# Patient Record
Sex: Female | Born: 1950
Health system: Southern US, Community
[De-identification: ages and names within clinical notes are randomized; demographics above are authoritative.]

## PROBLEM LIST (undated history)

## (undated) DIAGNOSIS — R253 Fasciculation: Secondary | ICD-10-CM

## (undated) DIAGNOSIS — H3581 Retinal edema: Secondary | ICD-10-CM

## (undated) DIAGNOSIS — R3129 Other microscopic hematuria: Secondary | ICD-10-CM

## (undated) DIAGNOSIS — K648 Other hemorrhoids: Secondary | ICD-10-CM

## (undated) DIAGNOSIS — K219 Gastro-esophageal reflux disease without esophagitis: Secondary | ICD-10-CM

## (undated) DIAGNOSIS — Z411 Encounter for cosmetic surgery: Secondary | ICD-10-CM

## (undated) DIAGNOSIS — I491 Atrial premature depolarization: Secondary | ICD-10-CM

## (undated) DIAGNOSIS — Z87442 Personal history of urinary calculi: Secondary | ICD-10-CM

## (undated) DIAGNOSIS — N952 Postmenopausal atrophic vaginitis: Secondary | ICD-10-CM

## (undated) DIAGNOSIS — I071 Rheumatic tricuspid insufficiency: Secondary | ICD-10-CM

## (undated) DIAGNOSIS — M549 Dorsalgia, unspecified: Secondary | ICD-10-CM

## (undated) DIAGNOSIS — K589 Irritable bowel syndrome without diarrhea: Secondary | ICD-10-CM

## (undated) DIAGNOSIS — K625 Hemorrhage of anus and rectum: Secondary | ICD-10-CM

## (undated) DIAGNOSIS — K5900 Constipation, unspecified: Secondary | ICD-10-CM

## (undated) DIAGNOSIS — M8580 Other specified disorders of bone density and structure, unspecified site: Secondary | ICD-10-CM

## (undated) DIAGNOSIS — R55 Syncope and collapse: Secondary | ICD-10-CM

## (undated) DIAGNOSIS — G43109 Migraine with aura, not intractable, without status migrainosus: Secondary | ICD-10-CM

## (undated) DIAGNOSIS — H43392 Other vitreous opacities, left eye: Secondary | ICD-10-CM

## (undated) DIAGNOSIS — I351 Nonrheumatic aortic (valve) insufficiency: Secondary | ICD-10-CM

## (undated) DIAGNOSIS — I493 Ventricular premature depolarization: Secondary | ICD-10-CM

## (undated) DIAGNOSIS — F449 Dissociative and conversion disorder, unspecified: Secondary | ICD-10-CM

## (undated) DIAGNOSIS — J309 Allergic rhinitis, unspecified: Secondary | ICD-10-CM

## (undated) DIAGNOSIS — N200 Calculus of kidney: Secondary | ICD-10-CM

## (undated) HISTORY — DX: Dissociative and conversion disorder, unspecified: F44.9

## (undated) HISTORY — DX: Gastro-esophageal reflux disease without esophagitis: K21.9

## (undated) HISTORY — DX: Postmenopausal atrophic vaginitis: N95.2

## (undated) HISTORY — DX: Constipation, unspecified: K59.00

## (undated) HISTORY — DX: Other microscopic hematuria: R31.29

## (undated) HISTORY — DX: Atrial premature depolarization: I49.1

## (undated) HISTORY — DX: Syncope and collapse: R55

## (undated) HISTORY — DX: Allergic rhinitis, unspecified: J30.9

## (undated) HISTORY — DX: Retinal edema: H35.81

## (undated) HISTORY — DX: Other specified disorders of bone density and structure, unspecified site: M85.80

## (undated) HISTORY — DX: Irritable bowel syndrome, unspecified: K58.9

## (undated) HISTORY — DX: Nonrheumatic aortic (valve) insufficiency: I35.1

## (undated) HISTORY — PX: SKIN CANCER EXCISION: SHX779

## (undated) HISTORY — DX: Dorsalgia, unspecified: M54.9

## (undated) HISTORY — DX: Fasciculation: R25.3

## (undated) HISTORY — DX: Rheumatic tricuspid insufficiency: I07.1

## (undated) HISTORY — DX: Other vitreous opacities, left eye: H43.392

## (undated) HISTORY — DX: Migraine with aura, not intractable, without status migrainosus: G43.109

## (undated) HISTORY — DX: Encounter for cosmetic surgery: Z41.1

## (undated) HISTORY — DX: Hemorrhage of anus and rectum: K62.5

## (undated) HISTORY — DX: Other hemorrhoids: K64.8

## (undated) HISTORY — PX: WISDOM TOOTH EXTRACTION: SHX21

## (undated) HISTORY — DX: Personal history of urinary calculi: Z87.442

## (undated) HISTORY — PX: BREAST BIOPSY: SHX20

## (undated) HISTORY — DX: Ventricular premature depolarization: I49.3

## (undated) HISTORY — DX: Calculus of kidney: N20.0

## (undated) HISTORY — PX: OTHER SURGICAL HISTORY: SHX169

---

## 1998-01-14 ENCOUNTER — Other Ambulatory Visit: Admission: RE | Admit: 1998-01-14 | Discharge: 1998-01-14 | Payer: Self-pay | Admitting: Internal Medicine

## 1998-01-24 ENCOUNTER — Other Ambulatory Visit: Admission: RE | Admit: 1998-01-24 | Discharge: 1998-01-24 | Payer: Self-pay | Admitting: Surgery

## 1998-04-07 ENCOUNTER — Emergency Department (HOSPITAL_COMMUNITY): Admission: EM | Admit: 1998-04-07 | Discharge: 1998-04-08 | Payer: Self-pay | Admitting: Emergency Medicine

## 1999-03-28 ENCOUNTER — Other Ambulatory Visit: Admission: RE | Admit: 1999-03-28 | Discharge: 1999-03-28 | Payer: Self-pay | Admitting: Internal Medicine

## 2000-05-22 ENCOUNTER — Other Ambulatory Visit: Admission: RE | Admit: 2000-05-22 | Discharge: 2000-05-22 | Payer: Self-pay | Admitting: Internal Medicine

## 2001-05-19 ENCOUNTER — Encounter: Payer: Self-pay | Admitting: Internal Medicine

## 2001-06-09 ENCOUNTER — Other Ambulatory Visit: Admission: RE | Admit: 2001-06-09 | Discharge: 2001-06-09 | Payer: Self-pay | Admitting: *Deleted

## 2002-07-03 ENCOUNTER — Other Ambulatory Visit: Admission: RE | Admit: 2002-07-03 | Discharge: 2002-07-03 | Payer: Self-pay | Admitting: Internal Medicine

## 2003-07-19 ENCOUNTER — Other Ambulatory Visit: Admission: RE | Admit: 2003-07-19 | Discharge: 2003-07-19 | Payer: Self-pay | Admitting: Internal Medicine

## 2003-08-15 ENCOUNTER — Emergency Department (HOSPITAL_COMMUNITY): Admission: AD | Admit: 2003-08-15 | Discharge: 2003-08-15 | Payer: Self-pay | Admitting: Family Medicine

## 2003-08-19 ENCOUNTER — Ambulatory Visit (HOSPITAL_COMMUNITY): Admission: RE | Admit: 2003-08-19 | Discharge: 2003-08-19 | Payer: Self-pay | Admitting: Internal Medicine

## 2003-11-02 ENCOUNTER — Encounter: Payer: Self-pay | Admitting: Internal Medicine

## 2004-03-10 ENCOUNTER — Ambulatory Visit: Payer: Self-pay | Admitting: Sports Medicine

## 2004-04-13 ENCOUNTER — Emergency Department (HOSPITAL_COMMUNITY): Admission: EM | Admit: 2004-04-13 | Discharge: 2004-04-14 | Payer: Self-pay | Admitting: Emergency Medicine

## 2004-04-14 ENCOUNTER — Ambulatory Visit: Payer: Self-pay | Admitting: Sports Medicine

## 2004-04-28 ENCOUNTER — Ambulatory Visit: Payer: Self-pay | Admitting: Internal Medicine

## 2004-05-02 ENCOUNTER — Ambulatory Visit: Payer: Self-pay | Admitting: Cardiology

## 2004-06-26 ENCOUNTER — Ambulatory Visit: Payer: Self-pay | Admitting: Licensed Clinical Social Worker

## 2004-07-03 ENCOUNTER — Ambulatory Visit: Payer: Self-pay | Admitting: Licensed Clinical Social Worker

## 2004-07-10 ENCOUNTER — Ambulatory Visit: Payer: Self-pay | Admitting: Licensed Clinical Social Worker

## 2004-07-11 ENCOUNTER — Ambulatory Visit: Payer: Self-pay | Admitting: Internal Medicine

## 2004-07-19 ENCOUNTER — Ambulatory Visit: Payer: Self-pay | Admitting: Licensed Clinical Social Worker

## 2004-07-28 ENCOUNTER — Ambulatory Visit: Payer: Self-pay | Admitting: Licensed Clinical Social Worker

## 2004-08-04 ENCOUNTER — Ambulatory Visit: Payer: Self-pay | Admitting: Licensed Clinical Social Worker

## 2004-08-08 ENCOUNTER — Ambulatory Visit: Payer: Self-pay | Admitting: Licensed Clinical Social Worker

## 2004-08-11 ENCOUNTER — Ambulatory Visit: Payer: Self-pay | Admitting: Licensed Clinical Social Worker

## 2004-08-14 ENCOUNTER — Ambulatory Visit: Payer: Self-pay | Admitting: Licensed Clinical Social Worker

## 2004-08-22 ENCOUNTER — Ambulatory Visit: Payer: Self-pay | Admitting: Licensed Clinical Social Worker

## 2004-09-01 ENCOUNTER — Ambulatory Visit: Payer: Self-pay | Admitting: Licensed Clinical Social Worker

## 2004-09-18 ENCOUNTER — Ambulatory Visit: Payer: Self-pay | Admitting: Internal Medicine

## 2004-09-19 ENCOUNTER — Ambulatory Visit: Payer: Self-pay | Admitting: Licensed Clinical Social Worker

## 2004-10-03 ENCOUNTER — Ambulatory Visit: Payer: Self-pay | Admitting: Licensed Clinical Social Worker

## 2004-10-09 ENCOUNTER — Ambulatory Visit: Payer: Self-pay | Admitting: Licensed Clinical Social Worker

## 2004-10-13 ENCOUNTER — Ambulatory Visit: Payer: Self-pay | Admitting: Licensed Clinical Social Worker

## 2004-10-18 ENCOUNTER — Ambulatory Visit: Payer: Self-pay | Admitting: Internal Medicine

## 2004-10-26 ENCOUNTER — Ambulatory Visit: Payer: Self-pay | Admitting: Licensed Clinical Social Worker

## 2004-11-10 ENCOUNTER — Ambulatory Visit: Payer: Self-pay | Admitting: Licensed Clinical Social Worker

## 2004-11-24 ENCOUNTER — Ambulatory Visit: Payer: Self-pay | Admitting: Licensed Clinical Social Worker

## 2004-12-08 ENCOUNTER — Ambulatory Visit: Payer: Self-pay | Admitting: Licensed Clinical Social Worker

## 2004-12-13 ENCOUNTER — Ambulatory Visit: Payer: Self-pay | Admitting: Licensed Clinical Social Worker

## 2004-12-22 ENCOUNTER — Ambulatory Visit: Payer: Self-pay | Admitting: Licensed Clinical Social Worker

## 2004-12-25 ENCOUNTER — Ambulatory Visit: Payer: Self-pay | Admitting: Internal Medicine

## 2005-01-04 ENCOUNTER — Ambulatory Visit: Payer: Self-pay | Admitting: Licensed Clinical Social Worker

## 2005-01-25 ENCOUNTER — Ambulatory Visit: Payer: Self-pay | Admitting: Licensed Clinical Social Worker

## 2005-02-09 ENCOUNTER — Ambulatory Visit: Payer: Self-pay | Admitting: Licensed Clinical Social Worker

## 2005-03-07 ENCOUNTER — Ambulatory Visit: Payer: Self-pay | Admitting: Licensed Clinical Social Worker

## 2005-03-09 ENCOUNTER — Ambulatory Visit: Payer: Self-pay | Admitting: Internal Medicine

## 2005-03-15 ENCOUNTER — Ambulatory Visit: Payer: Self-pay | Admitting: Licensed Clinical Social Worker

## 2005-03-20 ENCOUNTER — Ambulatory Visit: Payer: Self-pay | Admitting: Licensed Clinical Social Worker

## 2005-03-21 ENCOUNTER — Ambulatory Visit: Payer: Self-pay | Admitting: Licensed Clinical Social Worker

## 2005-04-09 ENCOUNTER — Ambulatory Visit: Payer: Self-pay | Admitting: Licensed Clinical Social Worker

## 2005-04-24 ENCOUNTER — Ambulatory Visit: Payer: Self-pay | Admitting: Licensed Clinical Social Worker

## 2005-05-08 ENCOUNTER — Ambulatory Visit: Payer: Self-pay | Admitting: Licensed Clinical Social Worker

## 2005-05-16 ENCOUNTER — Ambulatory Visit: Payer: Self-pay | Admitting: Internal Medicine

## 2005-05-24 ENCOUNTER — Ambulatory Visit: Payer: Self-pay | Admitting: Internal Medicine

## 2005-05-25 ENCOUNTER — Ambulatory Visit: Payer: Self-pay | Admitting: Licensed Clinical Social Worker

## 2005-05-28 ENCOUNTER — Ambulatory Visit: Payer: Self-pay | Admitting: Internal Medicine

## 2005-05-28 ENCOUNTER — Other Ambulatory Visit: Admission: RE | Admit: 2005-05-28 | Discharge: 2005-05-28 | Payer: Self-pay | Admitting: Internal Medicine

## 2005-05-28 ENCOUNTER — Encounter: Payer: Self-pay | Admitting: Internal Medicine

## 2005-08-15 ENCOUNTER — Ambulatory Visit: Payer: Self-pay

## 2005-08-15 ENCOUNTER — Encounter: Payer: Self-pay | Admitting: Cardiology

## 2005-08-23 ENCOUNTER — Ambulatory Visit: Payer: Self-pay | Admitting: Cardiology

## 2005-09-19 ENCOUNTER — Ambulatory Visit: Payer: Self-pay | Admitting: Internal Medicine

## 2005-10-24 ENCOUNTER — Ambulatory Visit: Payer: Self-pay | Admitting: Internal Medicine

## 2005-10-29 ENCOUNTER — Ambulatory Visit: Payer: Self-pay | Admitting: Cardiology

## 2005-12-27 ENCOUNTER — Ambulatory Visit: Payer: Self-pay | Admitting: Internal Medicine

## 2006-01-12 ENCOUNTER — Ambulatory Visit: Payer: Self-pay | Admitting: Internal Medicine

## 2006-04-19 ENCOUNTER — Ambulatory Visit: Payer: Self-pay | Admitting: Internal Medicine

## 2006-04-29 ENCOUNTER — Ambulatory Visit: Payer: Self-pay | Admitting: Internal Medicine

## 2006-05-17 ENCOUNTER — Ambulatory Visit: Payer: Self-pay | Admitting: Internal Medicine

## 2006-06-25 ENCOUNTER — Ambulatory Visit: Payer: Self-pay | Admitting: Internal Medicine

## 2006-06-25 LAB — CONVERTED CEMR LAB
Albumin: 3.9 g/dL (ref 3.5–5.2)
BUN: 23 mg/dL (ref 6–23)
Basophils Absolute: 0.1 10*3/uL (ref 0.0–0.1)
CO2: 30 meq/L (ref 19–32)
Calcium: 9.5 mg/dL (ref 8.4–10.5)
Chol/HDL Ratio, serum: 2.4
Cholesterol: 184 mg/dL (ref 0–200)
Creatinine, Ser: 0.8 mg/dL (ref 0.4–1.2)
Hemoglobin: 13.4 g/dL (ref 12.0–15.0)
Ketones, ur: NEGATIVE mg/dL
LDL Cholesterol: 96 mg/dL (ref 0–99)
Lymphocytes Relative: 38.6 % (ref 12.0–46.0)
MCHC: 33.9 g/dL (ref 30.0–36.0)
Monocytes Relative: 9 % (ref 3.0–11.0)
Neutro Abs: 2 10*3/uL (ref 1.4–7.7)
Neutrophils Relative %: 46.5 % (ref 43.0–77.0)
Nitrite: NEGATIVE
TSH: 1.75 microintl units/mL (ref 0.35–5.50)
Total Bilirubin: 0.9 mg/dL (ref 0.3–1.2)
Urine Glucose: NEGATIVE mg/dL
VLDL: 13 mg/dL (ref 0–40)

## 2006-07-01 ENCOUNTER — Encounter: Payer: Self-pay | Admitting: Internal Medicine

## 2006-07-01 ENCOUNTER — Ambulatory Visit: Payer: Self-pay | Admitting: Internal Medicine

## 2006-07-01 ENCOUNTER — Other Ambulatory Visit: Admission: RE | Admit: 2006-07-01 | Discharge: 2006-07-01 | Payer: Self-pay | Admitting: Internal Medicine

## 2006-07-15 ENCOUNTER — Encounter: Payer: Self-pay | Admitting: Cardiology

## 2006-07-15 ENCOUNTER — Ambulatory Visit: Payer: Self-pay

## 2006-08-06 ENCOUNTER — Ambulatory Visit: Payer: Self-pay | Admitting: Cardiology

## 2006-08-28 ENCOUNTER — Ambulatory Visit: Payer: Self-pay | Admitting: Internal Medicine

## 2006-09-28 ENCOUNTER — Ambulatory Visit: Payer: Self-pay | Admitting: Family Medicine

## 2006-11-12 ENCOUNTER — Ambulatory Visit: Payer: Self-pay | Admitting: Internal Medicine

## 2007-01-01 ENCOUNTER — Encounter: Payer: Self-pay | Admitting: Internal Medicine

## 2007-01-01 DIAGNOSIS — N200 Calculus of kidney: Secondary | ICD-10-CM | POA: Insufficient documentation

## 2007-01-01 DIAGNOSIS — F449 Dissociative and conversion disorder, unspecified: Secondary | ICD-10-CM | POA: Insufficient documentation

## 2007-01-01 DIAGNOSIS — J309 Allergic rhinitis, unspecified: Secondary | ICD-10-CM | POA: Insufficient documentation

## 2007-01-28 ENCOUNTER — Ambulatory Visit: Payer: Self-pay | Admitting: Cardiology

## 2007-01-30 ENCOUNTER — Encounter: Payer: Self-pay | Admitting: Cardiology

## 2007-01-30 ENCOUNTER — Ambulatory Visit: Payer: Self-pay

## 2007-03-25 ENCOUNTER — Encounter: Payer: Self-pay | Admitting: Internal Medicine

## 2007-03-25 ENCOUNTER — Ambulatory Visit: Payer: Self-pay | Admitting: Internal Medicine

## 2007-03-25 DIAGNOSIS — N952 Postmenopausal atrophic vaginitis: Secondary | ICD-10-CM | POA: Insufficient documentation

## 2007-04-01 ENCOUNTER — Ambulatory Visit: Payer: Self-pay | Admitting: Internal Medicine

## 2007-05-02 ENCOUNTER — Telehealth: Payer: Self-pay | Admitting: Internal Medicine

## 2007-06-23 ENCOUNTER — Ambulatory Visit: Payer: Self-pay | Admitting: Internal Medicine

## 2007-06-30 ENCOUNTER — Telehealth: Payer: Self-pay | Admitting: Internal Medicine

## 2007-06-30 ENCOUNTER — Ambulatory Visit: Payer: Self-pay | Admitting: Internal Medicine

## 2007-08-12 ENCOUNTER — Ambulatory Visit: Payer: Self-pay | Admitting: Cardiology

## 2007-09-01 ENCOUNTER — Encounter: Admission: RE | Admit: 2007-09-01 | Discharge: 2007-09-01 | Payer: Self-pay | Admitting: Orthopedic Surgery

## 2007-09-17 ENCOUNTER — Ambulatory Visit: Payer: Self-pay | Admitting: Internal Medicine

## 2007-09-25 ENCOUNTER — Encounter: Payer: Self-pay | Admitting: Internal Medicine

## 2007-09-25 ENCOUNTER — Telehealth: Payer: Self-pay | Admitting: Internal Medicine

## 2007-10-15 ENCOUNTER — Ambulatory Visit: Payer: Self-pay | Admitting: Internal Medicine

## 2007-10-28 ENCOUNTER — Ambulatory Visit: Payer: Self-pay | Admitting: Internal Medicine

## 2007-10-28 LAB — CONVERTED CEMR LAB
Alkaline Phosphatase: 61 units/L (ref 39–117)
Basophils Absolute: 0 10*3/uL (ref 0.0–0.1)
Bilirubin Urine: NEGATIVE
Bilirubin, Direct: 0.1 mg/dL (ref 0.0–0.3)
GFR calc Af Amer: 84 mL/min
GFR calc non Af Amer: 69 mL/min
Glucose, Bld: 82 mg/dL (ref 70–99)
HCT: 37.6 % (ref 36.0–46.0)
HDL: 77.1 mg/dL (ref >39.0)
Leukocytes, UA: NEGATIVE
Monocytes Absolute: 0.3 10*3/uL (ref 0.1–1.0)
Monocytes Relative: 9.5 % (ref 3.0–12.0)
Nitrite: NEGATIVE
Platelets: 172 10*3/uL (ref 150–400)
Potassium: 5.6 meq/L — ABNORMAL HIGH (ref 3.5–5.1)
RDW: 11.6 % (ref 11.5–14.6)
Sodium: 142 meq/L (ref 135–145)
Total Bilirubin: 0.8 mg/dL (ref 0.3–1.2)
Total CHOL/HDL Ratio: 2.4
Total Protein: 7.1 g/dL (ref 6.0–8.3)
VLDL: 7 mg/dL (ref 0–40)

## 2007-11-03 ENCOUNTER — Encounter: Payer: Self-pay | Admitting: Internal Medicine

## 2007-11-03 ENCOUNTER — Ambulatory Visit: Payer: Self-pay | Admitting: Internal Medicine

## 2007-11-03 ENCOUNTER — Other Ambulatory Visit: Admission: RE | Admit: 2007-11-03 | Discharge: 2007-11-03 | Payer: Self-pay | Admitting: Internal Medicine

## 2007-11-03 DIAGNOSIS — B009 Herpesviral infection, unspecified: Secondary | ICD-10-CM | POA: Insufficient documentation

## 2007-11-10 ENCOUNTER — Encounter: Payer: Self-pay | Admitting: Internal Medicine

## 2007-11-13 ENCOUNTER — Telehealth: Payer: Self-pay | Admitting: Internal Medicine

## 2007-11-27 ENCOUNTER — Telehealth: Payer: Self-pay | Admitting: Internal Medicine

## 2007-11-29 DIAGNOSIS — Z87442 Personal history of urinary calculi: Secondary | ICD-10-CM | POA: Insufficient documentation

## 2007-12-04 ENCOUNTER — Ambulatory Visit: Payer: Self-pay | Admitting: Internal Medicine

## 2007-12-09 ENCOUNTER — Encounter: Payer: Self-pay | Admitting: Internal Medicine

## 2007-12-17 ENCOUNTER — Ambulatory Visit: Payer: Self-pay | Admitting: Internal Medicine

## 2008-01-02 ENCOUNTER — Encounter: Payer: Self-pay | Admitting: Internal Medicine

## 2008-01-15 ENCOUNTER — Ambulatory Visit: Payer: Self-pay

## 2008-01-15 ENCOUNTER — Encounter: Payer: Self-pay | Admitting: Cardiology

## 2008-01-21 ENCOUNTER — Telehealth: Payer: Self-pay | Admitting: Internal Medicine

## 2008-02-02 ENCOUNTER — Ambulatory Visit: Payer: Self-pay | Admitting: Cardiology

## 2008-02-11 ENCOUNTER — Telehealth: Payer: Self-pay | Admitting: Internal Medicine

## 2008-03-04 ENCOUNTER — Ambulatory Visit: Payer: Self-pay | Admitting: Internal Medicine

## 2008-03-05 ENCOUNTER — Ambulatory Visit: Payer: Self-pay | Admitting: Internal Medicine

## 2008-03-05 LAB — CONVERTED CEMR LAB: Creatinine, Ser: 0.8 mg/dL (ref 0.4–1.2)

## 2008-03-08 ENCOUNTER — Telehealth: Payer: Self-pay | Admitting: Internal Medicine

## 2008-03-08 ENCOUNTER — Ambulatory Visit: Payer: Self-pay | Admitting: Internal Medicine

## 2008-03-09 ENCOUNTER — Encounter: Payer: Self-pay | Admitting: Internal Medicine

## 2008-04-09 ENCOUNTER — Ambulatory Visit: Payer: Self-pay | Admitting: Internal Medicine

## 2008-05-07 ENCOUNTER — Telehealth: Payer: Self-pay | Admitting: Internal Medicine

## 2008-05-08 ENCOUNTER — Ambulatory Visit: Payer: Self-pay | Admitting: Family Medicine

## 2008-05-08 LAB — CONVERTED CEMR LAB
Bilirubin Urine: NEGATIVE
Glucose, Urine, Semiquant: NEGATIVE
Ketones, urine, test strip: NEGATIVE
pH: 6

## 2008-05-14 LAB — CONVERTED CEMR LAB
Bacteria, UA: NONE SEEN
RBC / HPF: NONE SEEN (ref <3)
WBC, UA: NONE SEEN cells/hpf (ref <3)

## 2008-05-21 ENCOUNTER — Ambulatory Visit: Payer: Self-pay | Admitting: Internal Medicine

## 2008-07-10 ENCOUNTER — Emergency Department (HOSPITAL_COMMUNITY): Admission: EM | Admit: 2008-07-10 | Discharge: 2008-07-10 | Payer: Self-pay | Admitting: Emergency Medicine

## 2008-07-12 ENCOUNTER — Ambulatory Visit: Payer: Self-pay | Admitting: Internal Medicine

## 2008-08-04 ENCOUNTER — Ambulatory Visit: Payer: Self-pay | Admitting: Cardiology

## 2008-08-31 ENCOUNTER — Telehealth: Payer: Self-pay | Admitting: Internal Medicine

## 2008-09-01 ENCOUNTER — Telehealth: Payer: Self-pay | Admitting: Internal Medicine

## 2008-09-02 ENCOUNTER — Encounter: Admission: RE | Admit: 2008-09-02 | Discharge: 2008-09-02 | Payer: Self-pay | Admitting: Internal Medicine

## 2008-09-02 LAB — HM MAMMOGRAPHY: HM Mammogram: NEGATIVE

## 2008-09-14 ENCOUNTER — Telehealth: Payer: Self-pay | Admitting: Internal Medicine

## 2008-09-14 ENCOUNTER — Ambulatory Visit: Payer: Self-pay | Admitting: Internal Medicine

## 2008-10-12 ENCOUNTER — Encounter (INDEPENDENT_AMBULATORY_CARE_PROVIDER_SITE_OTHER): Payer: Self-pay | Admitting: *Deleted

## 2008-11-10 ENCOUNTER — Telehealth: Payer: Self-pay | Admitting: Cardiology

## 2008-12-02 ENCOUNTER — Ambulatory Visit: Payer: Self-pay | Admitting: Internal Medicine

## 2008-12-02 LAB — CONVERTED CEMR LAB
ALT: 20 units/L (ref 0–35)
AST: 21 units/L (ref 0–37)
Albumin: 4.2 g/dL (ref 3.5–5.2)
BUN: 21 mg/dL (ref 6–23)
Basophils Absolute: 0 10*3/uL (ref 0.0–0.1)
Bilirubin Urine: NEGATIVE
Chloride: 107 meq/L (ref 96–112)
Cholesterol: 178 mg/dL (ref 0–200)
Eosinophils Relative: 4.3 % (ref 0.0–5.0)
Glucose, Bld: 81 mg/dL (ref 70–99)
HCT: 38.7 % (ref 36.0–46.0)
Hemoglobin: 13.3 g/dL (ref 12.0–15.0)
Ketones, ur: NEGATIVE mg/dL
Leukocytes, UA: NEGATIVE
Lymphs Abs: 1.6 10*3/uL (ref 0.7–4.0)
MCV: 93.9 fL (ref 78.0–100.0)
Monocytes Absolute: 0.3 10*3/uL (ref 0.1–1.0)
Monocytes Relative: 8 % (ref 3.0–12.0)
Neutro Abs: 1.9 10*3/uL (ref 1.4–7.7)
Nitrite: NEGATIVE
Platelets: 161 10*3/uL (ref 150.0–400.0)
Potassium: 4 meq/L (ref 3.5–5.1)
RDW: 12.1 % (ref 11.5–14.6)
Sodium: 140 meq/L (ref 135–145)
TSH: 1.77 microintl units/mL (ref 0.35–5.50)
Total Bilirubin: 0.9 mg/dL (ref 0.3–1.2)
Urobilinogen, UA: 0.2 (ref 0.0–1.0)
pH: 5 (ref 5.0–8.0)

## 2008-12-09 ENCOUNTER — Ambulatory Visit: Payer: Self-pay | Admitting: Internal Medicine

## 2008-12-15 ENCOUNTER — Ambulatory Visit: Payer: Self-pay | Admitting: Internal Medicine

## 2008-12-15 DIAGNOSIS — K589 Irritable bowel syndrome without diarrhea: Secondary | ICD-10-CM | POA: Insufficient documentation

## 2008-12-15 DIAGNOSIS — K648 Other hemorrhoids: Secondary | ICD-10-CM | POA: Insufficient documentation

## 2008-12-15 DIAGNOSIS — K5900 Constipation, unspecified: Secondary | ICD-10-CM | POA: Insufficient documentation

## 2008-12-15 DIAGNOSIS — K625 Hemorrhage of anus and rectum: Secondary | ICD-10-CM | POA: Insufficient documentation

## 2008-12-31 ENCOUNTER — Ambulatory Visit: Payer: Self-pay | Admitting: Internal Medicine

## 2008-12-31 ENCOUNTER — Encounter: Payer: Self-pay | Admitting: Internal Medicine

## 2009-01-12 ENCOUNTER — Telehealth: Payer: Self-pay | Admitting: Internal Medicine

## 2009-01-14 ENCOUNTER — Telehealth: Payer: Self-pay | Admitting: Internal Medicine

## 2009-01-26 ENCOUNTER — Ambulatory Visit: Payer: Self-pay

## 2009-01-26 ENCOUNTER — Encounter: Payer: Self-pay | Admitting: Cardiology

## 2009-01-27 ENCOUNTER — Telehealth: Payer: Self-pay | Admitting: Internal Medicine

## 2009-01-28 ENCOUNTER — Ambulatory Visit: Payer: Self-pay | Admitting: Internal Medicine

## 2009-01-28 ENCOUNTER — Telehealth: Payer: Self-pay | Admitting: Internal Medicine

## 2009-02-01 ENCOUNTER — Telehealth: Payer: Self-pay | Admitting: Internal Medicine

## 2009-02-03 ENCOUNTER — Ambulatory Visit: Payer: Self-pay | Admitting: Cardiology

## 2009-02-03 DIAGNOSIS — R002 Palpitations: Secondary | ICD-10-CM | POA: Insufficient documentation

## 2009-03-08 ENCOUNTER — Telehealth: Payer: Self-pay | Admitting: Internal Medicine

## 2009-03-10 ENCOUNTER — Encounter: Payer: Self-pay | Admitting: Internal Medicine

## 2009-03-10 ENCOUNTER — Encounter: Payer: Self-pay | Admitting: Cardiology

## 2009-03-16 ENCOUNTER — Telehealth: Payer: Self-pay | Admitting: Cardiology

## 2009-04-05 ENCOUNTER — Telehealth: Payer: Self-pay | Admitting: Internal Medicine

## 2009-04-08 ENCOUNTER — Ambulatory Visit: Payer: Self-pay | Admitting: Internal Medicine

## 2009-04-12 ENCOUNTER — Ambulatory Visit: Payer: Self-pay | Admitting: Internal Medicine

## 2009-05-21 ENCOUNTER — Ambulatory Visit: Payer: Self-pay | Admitting: Family Medicine

## 2009-05-21 DIAGNOSIS — S335XXA Sprain of ligaments of lumbar spine, initial encounter: Secondary | ICD-10-CM | POA: Insufficient documentation

## 2009-05-31 ENCOUNTER — Telehealth: Payer: Self-pay | Admitting: Internal Medicine

## 2009-06-01 ENCOUNTER — Ambulatory Visit: Payer: Self-pay | Admitting: Internal Medicine

## 2009-06-01 ENCOUNTER — Encounter (INDEPENDENT_AMBULATORY_CARE_PROVIDER_SITE_OTHER): Payer: Self-pay | Admitting: *Deleted

## 2009-06-08 ENCOUNTER — Telehealth: Payer: Self-pay | Admitting: Internal Medicine

## 2009-07-22 ENCOUNTER — Telehealth (INDEPENDENT_AMBULATORY_CARE_PROVIDER_SITE_OTHER): Payer: Self-pay | Admitting: *Deleted

## 2009-07-28 ENCOUNTER — Telehealth: Payer: Self-pay | Admitting: Internal Medicine

## 2009-07-28 DIAGNOSIS — R259 Unspecified abnormal involuntary movements: Secondary | ICD-10-CM | POA: Insufficient documentation

## 2009-08-02 ENCOUNTER — Ambulatory Visit: Payer: Self-pay | Admitting: Cardiology

## 2009-08-02 DIAGNOSIS — I359 Nonrheumatic aortic valve disorder, unspecified: Secondary | ICD-10-CM | POA: Insufficient documentation

## 2009-08-10 ENCOUNTER — Encounter (INDEPENDENT_AMBULATORY_CARE_PROVIDER_SITE_OTHER): Payer: Self-pay | Admitting: *Deleted

## 2009-08-16 ENCOUNTER — Telehealth: Payer: Self-pay | Admitting: Internal Medicine

## 2009-08-25 ENCOUNTER — Ambulatory Visit: Payer: Self-pay | Admitting: Endocrinology

## 2009-08-25 DIAGNOSIS — M79609 Pain in unspecified limb: Secondary | ICD-10-CM | POA: Insufficient documentation

## 2009-09-09 ENCOUNTER — Encounter: Payer: Self-pay | Admitting: Internal Medicine

## 2009-10-18 ENCOUNTER — Telehealth (INDEPENDENT_AMBULATORY_CARE_PROVIDER_SITE_OTHER): Payer: Self-pay | Admitting: *Deleted

## 2009-11-02 ENCOUNTER — Telehealth: Payer: Self-pay | Admitting: Internal Medicine

## 2009-12-07 ENCOUNTER — Ambulatory Visit: Payer: Self-pay | Admitting: Internal Medicine

## 2009-12-07 LAB — CONVERTED CEMR LAB
Albumin: 4.5 g/dL (ref 3.5–5.2)
Alkaline Phosphatase: 58 units/L (ref 39–117)
BUN: 20 mg/dL (ref 6–23)
Basophils Relative: 1.2 % (ref 0.0–3.0)
Calcium: 9.5 mg/dL (ref 8.4–10.5)
Cholesterol: 195 mg/dL (ref 0–200)
Creatinine, Ser: 1 mg/dL (ref 0.4–1.2)
Eosinophils Absolute: 0.2 10*3/uL (ref 0.0–0.7)
GFR calc non Af Amer: 64.42 mL/min (ref >60)
HCT: 36.8 % (ref 36.0–46.0)
Hemoglobin: 12.8 g/dL (ref 12.0–15.0)
Ketones, ur: NEGATIVE mg/dL
LDL Cholesterol: 104 mg/dL — ABNORMAL HIGH (ref 0–99)
Leukocytes, UA: NEGATIVE
Lymphocytes Relative: 42.2 % (ref 12.0–46.0)
Lymphs Abs: 1.4 10*3/uL (ref 0.7–4.0)
MCHC: 34.8 g/dL (ref 30.0–36.0)
MCV: 93.2 fL (ref 78.0–100.0)
Neutro Abs: 1.4 10*3/uL (ref 1.4–7.7)
RBC: 3.95 M/uL (ref 3.87–5.11)
RDW: 13.1 % (ref 11.5–14.6)
TSH: 1.42 microintl units/mL (ref 0.35–5.50)
Total Protein: 7.2 g/dL (ref 6.0–8.3)
Triglycerides: 50 mg/dL (ref 0.0–149.0)
Urine Glucose: NEGATIVE mg/dL
Urobilinogen, UA: 0.2 (ref 0.0–1.0)

## 2009-12-12 ENCOUNTER — Ambulatory Visit: Payer: Self-pay | Admitting: Internal Medicine

## 2009-12-12 ENCOUNTER — Other Ambulatory Visit: Admission: RE | Admit: 2009-12-12 | Discharge: 2009-12-12 | Payer: Self-pay | Admitting: Internal Medicine

## 2009-12-12 LAB — CONVERTED CEMR LAB: Pap Smear: NEGATIVE

## 2009-12-18 ENCOUNTER — Encounter: Payer: Self-pay | Admitting: Internal Medicine

## 2009-12-20 ENCOUNTER — Telehealth: Payer: Self-pay | Admitting: Internal Medicine

## 2009-12-26 ENCOUNTER — Telehealth: Payer: Self-pay | Admitting: Internal Medicine

## 2009-12-28 ENCOUNTER — Telehealth: Payer: Self-pay | Admitting: Internal Medicine

## 2010-02-06 ENCOUNTER — Ambulatory Visit: Payer: Self-pay | Admitting: Internal Medicine

## 2010-02-06 DIAGNOSIS — D2330 Other benign neoplasm of skin of unspecified part of face: Secondary | ICD-10-CM | POA: Insufficient documentation

## 2010-02-08 ENCOUNTER — Telehealth: Payer: Self-pay | Admitting: Internal Medicine

## 2010-03-13 ENCOUNTER — Encounter: Payer: Self-pay | Admitting: Internal Medicine

## 2010-03-31 ENCOUNTER — Ambulatory Visit: Payer: Self-pay | Admitting: Internal Medicine

## 2010-04-03 ENCOUNTER — Ambulatory Visit: Payer: Self-pay | Admitting: Cardiology

## 2010-04-10 ENCOUNTER — Ambulatory Visit: Payer: Self-pay | Admitting: Internal Medicine

## 2010-05-15 ENCOUNTER — Ambulatory Visit: Payer: Self-pay | Admitting: Internal Medicine

## 2010-06-02 ENCOUNTER — Emergency Department (HOSPITAL_BASED_OUTPATIENT_CLINIC_OR_DEPARTMENT_OTHER)
Admission: EM | Admit: 2010-06-02 | Discharge: 2010-06-02 | Payer: Self-pay | Source: Home / Self Care | Admitting: Emergency Medicine

## 2010-07-10 ENCOUNTER — Ambulatory Visit
Admission: RE | Admit: 2010-07-10 | Discharge: 2010-07-10 | Payer: Self-pay | Source: Home / Self Care | Attending: Internal Medicine | Admitting: Internal Medicine

## 2010-07-18 NOTE — Progress Notes (Signed)
Summary: Eye issue  Phone Note Call from Patient Call back at Home Phone [phone]   Summary of Call: Patient called stating that she has a twitch and throbbing under her eye where eye liner goes. Patient denies stye, redness, pus, and any distubances with her vision. Per the patient she can see it throb along with her heartbeat when she looks in the mirror. Please advise. Initial call taken by: Lucious Groves,  July 22, 2009 10:23 AM  Follow-up for Phone Call        If blood pressure is normal I recommend that she see her opthalmoologist Follow-up by: Jacques Navy MD,  July 22, 2009 3:32 PM  Additional Follow-up for Phone Call Additional follow up Details #1::        left message on machine to call back to office. Additional Follow-up by: Lucious Groves,  July 22, 2009 4:06 PM    Additional Follow-up for Phone Call Additional follow up Details #2::    pt informed Follow-up by: Ami Bullins CMA,  July 22, 2009 4:11 PM

## 2010-07-18 NOTE — Progress Notes (Signed)
Summary: Zegerid rx  Phone Note Call from Patient Call back at [facility] Phone [phone]   Summary of Call: Patient is requesting rx from Dr Debby Bud for zegerid 40/1100. OK?  Initial call taken by: Lamar Sprinkles, CMA,  December 26, 2009 2:38 PM  Follow-up for Phone Call        OK for Rx - 1 by mouth q AM, #30 refill prn Follow-up by: Jacques Navy MD,  December 26, 2009 6:11 PM  Additional Follow-up for Phone Call Additional follow up Details #1::        informed pt via voicemail Additional Follow-up by: Ami Bullins CMA,  December 27, 2009 11:29 AM    New/Updated Medications: ZEGERID 40-1100 MG CAPS (OMEPRAZOLE-SODIUM BICARBONATE) 1 by mouth once daily Prescriptions: ZEGERID 40-1100 MG CAPS (OMEPRAZOLE-SODIUM BICARBONATE) 1 by mouth once daily  #30 x 3   Entered by:   Ami Bullins CMA   Authorized by:   Jacques Navy MD   Signed by:   Bill Salinas CMA on 12/27/2009   Method used:   [facility] [phone]* (retail)       [address]       [place], [place]  [address]       Ph: [phone] or [phone]       Fax: [phone]   RxID:   [id number]

## 2010-07-18 NOTE — Progress Notes (Signed)
Summary: ZEGERID COVERAGE  Phone Note Call from Patient Call back at [facility] Phone [phone]   Summary of Call: Pt previouly was on zegerid 40/1100 but insurance will not cover med. She wants to know if we can do PA or override to get covered?  Initial call taken by: Lamar Sprinkles, CMA,  December 20, 2009 1:28 PM  Follow-up for Phone Call        Spoke with patient and confirmed that she has already tried and failed both Omeprazole and Nexium. Requested rejected claim from pharmacy if prior authorization is required. Follow-up by: Lucious Groves,  December 21, 2009 4:37 PM  Additional Follow-up for Phone Call Additional follow up Details #1::        no response from pharmacy, closed phone note. Additional Follow-up by: Lucious Groves,  December 26, 2009 11:44 AM

## 2010-07-18 NOTE — Progress Notes (Signed)
Summary: BRAND NAME  Phone Note Call from Patient   Summary of Call: Pt needs zegerid to state BMN to be able to have insurance to cover med. Pt informed  Initial call taken by: Lamar Sprinkles, CMA,  December 28, 2009 3:52 PM    New/Updated Medications: ZEGERID 40-1100 MG CAPS (OMEPRAZOLE-SODIUM BICARBONATE) 1 by mouth once daily BRAND MEDICALLY NECESSARY [BMN] Prescriptions: ZEGERID 40-1100 MG CAPS (OMEPRAZOLE-SODIUM BICARBONATE) 1 by mouth once daily BRAND MEDICALLY NECESSARY Brand medically necessary #90 x 1   Entered by:   Lamar Sprinkles, CMA   Authorized by:   Jacques Navy MD   Signed by:   Lamar Sprinkles, CMA on 12/28/2009   Method used:   [facility] [phone]* (retail)       [address]       [place], [place]  [address]       Ph: [phone] or [phone]       Fax: [phone]   RxID:   [id number]

## 2010-07-18 NOTE — Assessment & Plan Note (Signed)
Summary: rov per pt call/having flutters/lg    Visit Type:  6 mo f/u Referring Provider:  n/a Primary Provider:  Illene Regulus, MD  CC:  palpitations when pushing against something like when she is working out.....  History of Present Illness: [name] comes in today for further evaluation of her mild aortic regurgitation and palpitations.  She still working on a regular basis. She is having no symptoms except occasional palpitations are brief when she is doing chest presses. She's probably losing too much weight.  She denies any dyspnea on exertion, syncope, presyncope, PND, orthopnea, edema.  Last echocardiogram was January 26, 2009 which showed mild aortic insufficiency with normal left ventricular chamber size and systolic function. Where pressures were normal.  Blood work in June was unremarkable. Her total cholesterol is 195 LDL 104, HDL 81.5. TSH was normal. Potassium was normal at 5.2.  Medications reviewed. She is very compliant with her diet and medicines.  Current Medications (verified): 1)  Flax   Oil (Flaxseed (Linseed)) .[name].. 1 Tbs Once Daily 2)  Premarin 0.625 Mg/gm Crea (Estrogens, Conjugated) .[name].. 1 Vag Applicator Twice A Week 3)  Roc Retinol Correxion Night  Crea (Emollient) .... Use Daily 4)  Botox Cosmetic 50 Unit Solr (Onabotulinumtoxina (Cosmetic)) .[name].. 1 Injection Yearly 5)  Zegerid 40-1100 Mg Caps (Omeprazole-Sodium Bicarbonate) .[name].. 1 By Mouth Once Daily Brand Medically Necessary 6)  Anusol-Hc 25 Mg Supp (Hydrocortisone Acetate) .[name].. 1 Pr Two Times A Day As Needed Internal Hemorrhoids. 7)  Motrin Ib 200 Mg Tabs (Ibuprofen) .... As Needed 8)  Tums 500 Mg Chew (Calcium Carbonate Antacid) .... As Needed  Allergies: 1)  ! * Doxcycline 2)  ! Sulfa 3)  ! Penicillin 4)  ! Codeine 5)  ! Erythromycin  Past History:  Past Medical History: Last updated: 01/31/2009 AORTIC STENOSIS, MILD (ICD-424.1) MITRAL REGURGITATION, 0 (MILD) (ICD-396.3) ANXIETY,  SITUATIONAL (ICD-308.3) HEMORRHOIDS-INTERNAL (ICD-455.0) CONSTIPATION (ICD-564.00) IRRITABLE BOWEL SYNDROME (ICD-564.1) RECTAL BLEEDING (ICD-569.3) MICROSCOPIC HEMATURIA (ICD-599.72) RENAL CALCULUS, HX OF (ICD-V13.01) COSMETIC/PLASTIC SURGERY, HX OF (ICD-V50.1)injection treatment fillers/expanders HERPES SIMPLEX WITHOUT MENTION OF COMPLICATION (ICD-054.9) ESOPHAGEAL REFLUX (ICD-530.81) VAGINITIS, ATROPHIC, MILD (ICD-627.3) BACKACHE NOS (ICD-724.5) RHINITIS, ALLERGIC NOS (ICD-477.9) GLOBUS HYSTERICUS (ICD-300.11) NEPHROLITHIASIS (ICD-592.0)  Past Surgical History: Last updated: 11/03/2007 Cosmetic procedures with injection therapy wisdom teeth extracted  Family History: Last updated: 01/31/2009 father - '30: healthy, COPD-smoker, lipid mother - '33: healthy, Lipid Neg- breast or colon cancer, DM, CAD  Social History: Last updated: 12/15/2008 Vocational school in [place]. Married ' '90- marriage is in very good shape '09 children 0 Work: Engineer, civil (consulting) avid exerciser, Pharmacist, community Patient has never smoked.  Alcohol Use - no Illicit Drug Use - no  Risk Factors: Caffeine Use: 0 (11/03/2007) Exercise: yes (11/03/2007)  Risk Factors: Smoking Status: never (12/15/2008) Passive Smoke Exposure: no (11/03/2007)  Review of Systems       negative other than history of present illness  Vital Signs:  Patient profile:   60 year old female Height:      62 inches Weight:      104.8 pounds BMI:     19.24 Pulse rate:   61 / minute Pulse rhythm:   irregular BP sitting:   108 / 70  (left arm) Cuff size:   large  Vitals Entered By: Danielle Rankin, CMA (April 03, 2010 11:36 AM)  Physical Exam  General:  Well developed, well nourished, in no acute distress. Head:  normocephalic and atraumatic Eyes:  PERRLA/EOM intact; conjunctiva and lids normal. Neck:  Neck supple, no JVD. No masses, thyromegaly or abnormal cervical nodes. Chest Wall:  no deformities or breast masses  noted Lungs:  Clear bilaterally to auscultation and percussion. Heart:  PMI nondisplaced, normal S1-S2, regular rate and rhythm, soft diastolic murmur at the left upper sternal border. Carotids full without bruits Abdomen:  Bowel sounds positive; abdomen soft and non-tender without masses, organomegaly, or hernias noted. No hepatosplenomegaly. Msk:  Back normal, normal gait. Muscle strength and tone normal. Pulses:  pulses normal in all 4 extremities Extremities:  No clubbing or cyanosis. Neurologic:  Alert and oriented x 3. Skin:  Intact without lesions or rashes. Psych:  Normal affect.   EKG  Procedure date:  04/03/2010  Findings:      normal sinus rhythm, normal PR QRS and QTc interval.  Impression & Recommendations:  Problem # 1:  AORTIC INSUFFICIENCY, MILD (ICD-424.1) Assessment Unchanged  Problem # 2:  PALPITATIONS (ICD-785.1) Assessment: Unchanged  Orders: EKG w/ Interpretation (93000)  Patient Instructions: 1)  Your physician recommends that you schedule a follow-up appointment in: 1 year with Dr. Daleen Squibb 2)  Your physician recommends that you continue on your current medications as directed. Please refer to the Current Medication list given to you today.

## 2010-07-18 NOTE — Assessment & Plan Note (Signed)
Summary: OV--BRUISED ON FOREHEAD STC   Vital Signs:  Patient profile:   60 year old female Height:      62 inches Weight:      104 pounds BMI:     19.09 O2 Sat:      99 % on Room air Temp:     98.7 degrees F oral Pulse rate:   71 / minute BP sitting:   102 / 70  (left arm) Cuff size:   large  Vitals Entered By: Bill Salinas CMA (May 15, 2010 10:56 AM)  O2 Flow:  Room air CC: pt here for evaluation of bruse above her left eyebrow from botox/ ab   Primary Care Provider:  Illene Regulus, MD  CC:  pt here for evaluation of bruse above her left eyebrow from botox/ ab.  History of Present Illness: after recent botox treatment involving periorbital injections she did notice a bruise over the left eye. At the time of injection she reports there was a little bleeding. There is minor residual swelling at the medial aspect of the left brow.  Current Medications (verified): 1)  Flax   Oil (Flaxseed (Linseed)) .[name].. 1 Tbs Once Daily 2)  Premarin 0.625 Mg/gm Crea (Estrogens, Conjugated) .[name].. 1 Vag Applicator Twice A Week 3)  Roc Retinol Correxion Night  Crea (Emollient) .... Use Daily 4)  Botox Cosmetic 50 Unit Solr (Onabotulinumtoxina (Cosmetic)) .[name].. 1 Injection Yearly 5)  Zegerid 40-1100 Mg Caps (Omeprazole-Sodium Bicarbonate) .[name].. 1 By Mouth Once Daily Brand Medically Necessary 6)  Anusol-Hc 25 Mg Supp (Hydrocortisone Acetate) .[name].. 1 Pr Two Times A Day As Needed Internal Hemorrhoids. 7)  Motrin Ib 200 Mg Tabs (Ibuprofen) .... As Needed 8)  Tums 500 Mg Chew (Calcium Carbonate Antacid) .... As Needed  Allergies (verified): 1)  ! * Doxcycline 2)  ! Sulfa 3)  ! Penicillin 4)  ! Codeine 5)  ! Erythromycin PMH reviewed for relevance, PSH reviewed for relevance, FH reviewed for relevance  Social History: Vocational school in [place]. 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 Offered medical services for them if needed (Nov '11) Patient has never smoked.  Alcohol Use - no Illicit Drug Use - no  Review of Systems  The patient denies anorexia, weight loss, headaches, muscle weakness, suspicious skin lesions, and abnormal bleeding.    Physical Exam  General:  WNWD white woman who looks younger than her stated age. Eyes:  pupils equal, pupils round, corneas and lenses clear, and no injection.   Lungs:  normal respiratory effort.   Heart:  normal rate and regular rhythm.   Msk:  normal ROM and no joint instability.   Pulses:  2+ radial Neurologic:  alert & oriented X3 and gait normal.   Skin:  turgor normal, color normal, and no rashes.  Minimal swelling of the medial aspect of the left eyebrow. No palpable mass or hematoma.   Impression & Recommendations:  Problem # 1:  COSMETIC/PLASTIC SURGERY, HX OF (ICD-V50.1) recent botox injection with probable small arteriole bleed and hematoma.   Plan - if swelling doesn't completely resolve consult with Dr. Benna Dunks.   Complete Medication List: 1)  Flax Oil (Flaxseed (linseed)) .[name].. 1 tbs once daily 2)  Premarin 0.625 Mg/gm Crea (Estrogens, conjugated) .[name].. 1 vag applicator twice a week 3)  Roc Retinol Correxion Night Crea (Emollient) .Marland KitchenMarland KitchenMarland Kitchen  Use daily 4)  Botox Cosmetic 50 Unit Solr (Onabotulinumtoxina (cosmetic)) .[name].. 1 injection yearly 5)  Zegerid 40-1100 Mg Caps (Omeprazole-sodium bicarbonate) .[name].. 1 by mouth once daily brand medically necessary 6)  Anusol-hc 25 Mg Supp (Hydrocortisone acetate) .[name].. 1 pr two times a day as needed internal hemorrhoids. 7)  Motrin Ib 200 Mg Tabs (Ibuprofen) .... As needed 8)  Tums 500 Mg Chew (Calcium carbonate antacid) .... As needed   Orders Added: 1)  Est. Patient Level II [27253]

## 2010-07-18 NOTE — Assessment & Plan Note (Signed)
Summary: neck pain/cd   Vital Signs:  Patient profile:   60 year old female Height:      62 inches Weight:      106 pounds BMI:     19.46 O2 Sat:      99 % on Room air Temp:     99.3 degrees F oral Pulse rate:   62 / minute BP sitting:   110 / 70  (left arm) Cuff size:   regular  Vitals Entered By: Bill Salinas CMA (March 31, 2010 4:19 PM)  O2 Flow:  Room air CC: pt was excercising this am and fell of a stability ball. She c/o soreness in her neck and shoulders/ ab   Primary Care Provider:  Illene Regulus, MD  CC:  pt was excercising this am and fell of a stability ball. She c/o soreness in her neck and shoulders/ ab.  History of Present Illness: Patient presents acutely for neck strain. She was using a large exercise  ball for back support while using dumbells. the ball rolled out from under her and she fell backwards. she has had the onset, immediately, of pain in the neck and upper back. she denies any loss of consciousness, paresthesia, muscle weakness or decreased range of motion.   Current Medications (verified): 1)  Flax   Oil (Flaxseed (Linseed)) .[name].. 1 Tbs Once Daily 2)  Premarin 0.625 Mg/gm Crea (Estrogens, Conjugated) .[name].. 1 Vag Applicator Twice A Week 3)  Roc Retinol Correxion Night  Crea (Emollient) .... Use Daily 4)  Botox Cosmetic 50 Unit Solr (Onabotulinumtoxina (Cosmetic)) .[name].. 1 Injection Yearly 5)  Zegerid 40-1100 Mg Caps (Omeprazole-Sodium Bicarbonate) .[name].. 1 By Mouth Once Daily Brand Medically Necessary 6)  Zithromax Z-Pak 250 Mg Tabs (Azithromycin) .... As Directed 7)  Anusol-Hc 25 Mg Supp (Hydrocortisone Acetate) .[name].. 1 Pr Two Times A Day As Needed Internal Hemorrhoids. 8)  Fluconazole 150 Mg Tabs (Fluconazole) .[name].. 1 By Mouth X 1 For Yeast Vaginitis 9)  Motrin Ib 200 Mg Tabs (Ibuprofen) .... As Needed 10)  Tums 500 Mg Chew (Calcium Carbonate Antacid) .... As Needed  Allergies (verified): 1)  ! * Doxcycline 2)  ! Sulfa 3)  ! Penicillin 4)  !  Codeine 5)  ! Erythromycin  Past History:  Past Medical History: Last updated: 01/31/2009 AORTIC STENOSIS, MILD (ICD-424.1) MITRAL REGURGITATION, 0 (MILD) (ICD-396.3) ANXIETY, SITUATIONAL (ICD-308.3) HEMORRHOIDS-INTERNAL (ICD-455.0) CONSTIPATION (ICD-564.00) IRRITABLE BOWEL SYNDROME (ICD-564.1) RECTAL BLEEDING (ICD-569.3) MICROSCOPIC HEMATURIA (ICD-599.72) RENAL CALCULUS, HX OF (ICD-V13.01) COSMETIC/PLASTIC SURGERY, HX OF (ICD-V50.1)injection treatment fillers/expanders HERPES SIMPLEX WITHOUT MENTION OF COMPLICATION (ICD-054.9) ESOPHAGEAL REFLUX (ICD-530.81) VAGINITIS, ATROPHIC, MILD (ICD-627.3) BACKACHE NOS (ICD-724.5) RHINITIS, ALLERGIC NOS (ICD-477.9) GLOBUS HYSTERICUS (ICD-300.11) NEPHROLITHIASIS (ICD-592.0)  Past Surgical History: Last updated: 11/03/2007 Cosmetic procedures with injection therapy wisdom teeth extracted PSH reviewed for relevance, FH reviewed for relevance  Review of Systems  The patient denies fever, weight loss, weight gain, chest pain, syncope, peripheral edema, abdominal pain, severe indigestion/heartburn, muscle weakness, difficulty walking, and abnormal bleeding.    Physical Exam  General:  WNWD very trim appearing white woman in no distress Head:  normocephalic and atraumatic.   Eyes:  pupils equal and pupils round.   Neck:  supple, full ROM, and no masses.   Lungs:  normal respiratory effort and normal breath sounds.   Heart:  normal rate and regular rhythm.   Msk:  normal fexion and extension of the neck. Nl shoulder shrug. Nl stength UE and normal reflexes. Tender in the posterior cervical region.  Pulses:  2+ Neurologic:  alert & oriented X3 and cranial nerves II-XII intact.   Skin:  turgor normal and color normal.   Psych:  normally interactive and good eye contact.     Impression & Recommendations:  Problem # 1:  NECK PAIN, ACUTE (ICD-723.1) Neck pain from acute strain. No neuro findings  Plan - ibuprofen as needed            neck rolls and shoulder shrugs with light weight           myofascial ball exercise at scapular region and anterior chest with soft tennis ball           call for problems  Her updated medication list for this problem includes:    Motrin Ib 200 Mg Tabs (Ibuprofen) .[name]... As needed  Complete Medication List: 1)  Flax Oil (Flaxseed (linseed)) .[name].. 1 tbs once daily 2)  Premarin 0.625 Mg/gm Crea (Estrogens, conjugated) .[name].. 1 vag applicator twice a week 3)  Roc Retinol Correxion Night Crea (Emollient) .... Use daily 4)  Botox Cosmetic 50 Unit Solr (Onabotulinumtoxina (cosmetic)) .[name].. 1 injection yearly 5)  Zegerid 40-1100 Mg Caps (Omeprazole-sodium bicarbonate) .[name].. 1 by mouth once daily brand medically necessary 6)  Zithromax Z-pak 250 Mg Tabs (Azithromycin) .... As directed 7)  Anusol-hc 25 Mg Supp (Hydrocortisone acetate) .[name].. 1 pr two times a day as needed internal hemorrhoids. 8)  Fluconazole 150 Mg Tabs (Fluconazole) .[name].. 1 by mouth x 1 for yeast vaginitis 9)  Motrin Ib 200 Mg Tabs (Ibuprofen) .... As needed 10)  Tums 500 Mg Chew (Calcium carbonate antacid) .... As needed   Orders Added: 1)  Est. Patient Level III [13244]

## 2010-07-18 NOTE — Assessment & Plan Note (Signed)
Summary: [name]   Nurse Visit   Allergies: 1)  ! * Doxcycline 2)  ! Sulfa 3)  ! Penicillin 4)  ! Codeine 5)  ! Erythromycin  Orders Added: 1)  Admin 1st Vaccine [90471] 2)  Flu Vaccine 71yrs + [04540]   Flu Vaccine Consent Questions     Do you have a history of severe allergic reactions to this vaccine? no    Any prior history of allergic reactions to egg and/or gelatin? no    Do you have a sensitivity to the preservative Thimersol? no    Do you have a past history of Guillan-Barre Syndrome? no    Do you currently have an acute febrile illness? no    Have you ever had a severe reaction to latex? no    Vaccine information given and explained to patient? yes    Are you currently pregnant? no    Lot Number:[id number]   Exp Date:12/16/2010   Site Given  Left Deltoid IMu1

## 2010-07-18 NOTE — Progress Notes (Signed)
Summary: Ask nurse a question   Phone Note Call from Patient Call back at Home Phone [phone]   Call For: DR Marina Goodell Reason for Call: Talk to Nurse Summary of Call: Has a question she would like to ask the nurse. Initial call taken by: Leanor Kail [facility],  Nov 02, 2009 10:30 AM  Follow-up for Phone Call        Last colon done 11/02/03 was normal  and f/u was to be 5 years.Is that what you still reccomend as she knows she is overdue and that if it is still due she gets BOTOX inj. on forehead and between her eyes and she was told that she would have to make you aware of that. Follow-up by: Teryl Lucy RN,  Nov 02, 2009 10:41 AM  Additional Follow-up for Phone Call Additional follow up Details #1::        we saw her last year for some intermittent rectal bleeding. If she still having problems? Okay to set up colonoscopy. Cutaneous Botox will not affect colonoscopy. Additional Follow-up by: Hilarie Fredrickson MD,  Nov 02, 2009 11:05 AM    Additional Follow-up for Phone Call Additional follow up Details #2::    Pt. has only had one or two times since seen last year of sm. amt of BRB on tissue after straining  when trying to have a b.m. so no colon will be scheduled.Will call for appt. if developss problems in the future.  Follow-up by: Teryl Lucy RN,  Nov 02, 2009 11:48 AM  Additional Follow-up for Phone Call Additional follow up Details #3:: Details for Additional Follow-up Action Taken: that's reasonable Additional Follow-up by: Hilarie Fredrickson MD,  Nov 02, 2009 11:51 AM

## 2010-07-18 NOTE — Progress Notes (Signed)
Summary: ZEGERID  Phone Note Call from Patient Call back at [facility] Phone [phone]   Summary of Call: Pt says that she takes zegerid 40/1100 daily. [facility]. Pt wants to know if there is a more easily available option to zegerid?  Initial call taken by: Lamar Sprinkles, CMA,  Oct 18, 2009 11:50 AM  Follow-up for Phone Call        ok to use omeprazole 40mg  only and she may take  baking soda seperately if need for rapid relief of acidity. Follow-up by: Jacques Navy MD,  Oct 18, 2009 12:07 PM  Additional Follow-up for Phone Call Additional follow up Details #1::        called pt left vm to return call Additional Follow-up by: Brenton Grills,  Oct 18, 2009 1:45 PM    New/Updated Medications: PRILOSEC 40 MG CPDR (OMEPRAZOLE) 1 tab as needed Prescriptions: PRILOSEC 40 MG CPDR (OMEPRAZOLE) 1 tab as needed  #30 x 3   Entered by:   Ami Bullins CMA   Authorized by:   Jacques Navy MD   Signed by:   Bill Salinas CMA on 10/18/2009   Method used:   [facility]        [facility] [phone][facility] (retail)       [address]       [place], [place]  [address]       Ph: [phone] or [phone]       Fax: [phone]   RxID:   [id number]

## 2010-07-18 NOTE — Letter (Signed)
Summary: [facility] Consult Scheduled Letter  [facility]  [address] [place], [place] [address]   Phone: [phone]  Fax: [phone]      08/10/2009 MRN: [id number]  [facility] [address] [place], [place]  [address]    Dear Ms. Coopersmith,      We have scheduled an appointment for you.  At the recommendation of Dr.Norins, we have scheduled you a consult with Dr Vickey Huger on 09/09/09 at 9:30am.  Their phone number is [phone].  If this appointment day and time is not convenient for you, please feel free to call the office of the doctor you are being referred to at the number listed above and reschedule the appointment.    [facility] [address] [place], [place] [address]   *Please arrive 30 minutes prior to appointment time.*    Thank you,  Patient Care Coordinator [facility]

## 2010-07-18 NOTE — Assessment & Plan Note (Signed)
Summary: cpx/dd   Vital Signs:  Patient profile:   60 year old female Height:      62 inches Weight:      104 pounds BMI:     19.09 O2 Sat:      98 % on Room air Temp:     98.7 degrees F oral Pulse rate:   60 / minute BP sitting:   124 / 68  (left arm) Cuff size:   regular  Vitals Entered By: Bill Salinas CMA (December 12, 2009 1:25 PM)  O2 Flow:  Room air CC: cpx/ab  Vision Screening:      Vision Comments: last eye exam was about Feb 2011 with normal result. Pt see Dr Ronney Asters and Dr Dione Booze   Primary Care Provider:  Illene Regulus, MD  CC:  cpx/ab.  History of Present Illness: Patient presents for routine well woman exam. She has been doing well with no new medical problems. She is seeing Dr. Daleen Squibb every 6 months. She is seeing Dr. Sherald Hess for body-sculpting and skin toning. She sees Dr. Ruthann Cancer for Botox treatments.  She reports that she continues to have a migraine aura without headache with a change in vision that is always less than an hour. She has a h/o migraines when she was younger. She takes no medication for this.  In the interval she has been evaluated for eye twitching by Dr. Marylou Flesher - final diagnosis of benign fasiculations with no need for further work-up.  She is stable from a GI perspective with a recent visit to Dr. Marina Goodell  She did have a misadventure when being fitted for a bite-block with adherence of the appliance to her teeth which required manipulation to get loose - took 4 hours. she has had no after-effects or sequellae.  Current Medications (verified): 1)  Vagifem 10 Mcg Tabs (Estradiol) .[name].. 1 Pv Twice A Week 2)  Flax   Oil (Flaxseed (Linseed)) .[name].. 1 Tbs Once Daily 3)  Premarin 1.25 Mg Tabs (Estrogens Conjugated) .[name].. 1 Tab Twice Weekly 4)  Roc Retinol Correxion Night  Crea (Emollient) .... Use Daily 5)  Botox Cosmetic 50 Unit Solr (Onabotulinumtoxina (Cosmetic)) .[name].. 1 Injection Yearly 6)  Prilosec 40 Mg Cpdr (Omeprazole) .[name].. 1 Tab As  Needed  Allergies (verified): 1)  ! * Doxcycline 2)  ! Sulfa 3)  ! Penicillin 4)  ! Codeine 5)  ! Erythromycin  Past History:  Past Medical History: Last updated: 01/31/2009 AORTIC STENOSIS, MILD (ICD-424.1) MITRAL REGURGITATION, 0 (MILD) (ICD-396.3) ANXIETY, SITUATIONAL (ICD-308.3) HEMORRHOIDS-INTERNAL (ICD-455.0) CONSTIPATION (ICD-564.00) IRRITABLE BOWEL SYNDROME (ICD-564.1) RECTAL BLEEDING (ICD-569.3) MICROSCOPIC HEMATURIA (ICD-599.72) RENAL CALCULUS, HX OF (ICD-V13.01) COSMETIC/PLASTIC SURGERY, HX OF (ICD-V50.1)injection treatment fillers/expanders HERPES SIMPLEX WITHOUT MENTION OF COMPLICATION (ICD-054.9) ESOPHAGEAL REFLUX (ICD-530.81) VAGINITIS, ATROPHIC, MILD (ICD-627.3) BACKACHE NOS (ICD-724.5) RHINITIS, ALLERGIC NOS (ICD-477.9) GLOBUS HYSTERICUS (ICD-300.11) NEPHROLITHIASIS (ICD-592.0)  Past Surgical History: Last updated: 11/03/2007 Cosmetic procedures with injection therapy wisdom teeth extracted  Family History: Last updated: 01/31/2009 father - '30: healthy, COPD-smoker, lipid mother - '33: healthy, Lipid Neg- breast or colon cancer, DM, CAD  Social History: Last updated: 12/15/2008 Vocational school in [place]. Married ' '90- marriage is in very good shape '09 children 0 Work: Engineer, civil (consulting) avid exerciser, Pharmacist, community Patient has never smoked.  Alcohol Use - no Illicit Drug Use - no  Risk Factors: Caffeine Use: 0 (11/03/2007) Exercise: yes (11/03/2007)  Risk Factors: Smoking Status: never (12/15/2008) Passive Smoke Exposure: no (11/03/2007)  Review of Systems  The patient denies anorexia, fever,  weight loss, weight gain, vision loss, decreased hearing, chest pain, syncope, dyspnea on exertion, peripheral edema, hemoptysis, abdominal pain, melena, severe indigestion/heartburn, muscle weakness, suspicious skin lesions, transient blindness, difficulty walking, depression, abnormal bleeding, angioedema, and breast masses.    Physical  Exam  General:  Well-developed,well-nourished,in no acute distress; alert,appropriate and cooperative throughout examination Head:  Normocephalic and atraumatic without obvious abnormalities. No apparent alopecia or balding. Eyes:  No corneal or conjunctival inflammation noted. EOMI. Perrla. Funduscopic exam benign, without hemorrhages, exudates or papilledema. Vision grossly normal. Ears:  mild cerumen bilaterally obscuring the TMs Nose:  no external deformity and no external erythema.   Mouth:  Oral mucosa and oropharynx without lesions or exudates.  Teeth in good repair. Neck:  supple, full ROM, no thyromegaly, and no carotid bruits.   Chest Wall:  no deformities.   Breasts:  No mass, nodules, thickening, tenderness, bulging, retraction, inflamation, nipple discharge or skin changes noted.   Lungs:  Normal respiratory effort, chest expands symmetrically. Lungs are clear to auscultation, no crackles or wheezes. Heart:  normal rate, regular rhythm, no gallop, no JVD, and no HJR.  Very soft systolic murmur best at the apex.  Abdomen:  soft, non-tender, normal bowel sounds, no distention, no masses, no guarding, and no hepatomegaly.   Genitalia:  Pelvic Exam:        External: normal female genitalia without lesions or masses        Vagina: normal without lesions or masses        Cervix: normal without lesions or masses        Adnexa: normal bimanual exam without masses or fullness        Uterus: normal by palpation        Pap smear: performed Msk:  normal ROM, no joint tenderness, no joint swelling, no joint warmth, and no joint instability.  Mildly enlarged PIP ring finger left hand. X-rays reviewed - no arthritic changes. Mild valgus deformity great toes/MTP joints. Pulses:  2+ radial Extremities:  No clubbing, cyanosis, edema, or deformity noted with normal full range of motion of all joints.   Neurologic:  alert & oriented X3, cranial nerves II-XII intact, sensation intact to light touch,  gait normal, and DTRs symmetrical and normal.   Skin:  turgor normal, color normal, no rashes, no suspicious lesions, and no edema.   Cervical Nodes:  no anterior cervical adenopathy and no posterior cervical adenopathy.   Axillary Nodes:  no R axillary adenopathy and no L axillary adenopathy.   Psych:  Oriented X3, memory intact for recent and remote, normally interactive, good eye contact, and not anxious appearing.     Impression & Recommendations:  Problem # 1:  AORTIC INSUFFICIENCY, MILD (ICD-424.1) Patient is now seen every 6 months by Dr. Daleen Squibb. Last Echo in August '10 - EF 60-65% (normal), mild aortic regurgitation.  Problem # 2:  ABNORMAL INVOLUNTARY MOVEMENTS (ICD-781.0) Reviewed Dr. Lewie Loron note. No need for further evaluation.  Problem # 3:  HEMORRHOIDS-INTERNAL (ICD-455.0) Still an occasional problem. She admits to straining.  Plan - bulk laxative of choice to be taken daily.           Rx - ansuol HC 2.5% suppositories to [place] as needed.  Problem # 4:  IRRITABLE BOWEL SYNDROME (ICD-564.1) Generally doing OK, no on an routine medications.  Problem # 5:  ESOPHAGEAL REFLUX (ICD-530.81) She reports she is doing well with only an occasional problem.  Plan - continue Zegerid now available OTC.  Her updated medication  list for this problem includes:    Zegerid Otc 20-1100 Mg Caps (Omeprazole-sodium bicarbonate) .[name]... 1 by mouth once daily prn  Problem # 6:  VAGINITIS, ATROPHIC, MILD (ICD-627.3) Normal exam today. Per present med list she is taking a very low dose of oral premarin twice a week and using vaginal estradiol twice a week.   Plan - verification of regimen via this note to patient  Her updated medication list for this problem includes:    Vagifem 10 Mcg Tabs (Estradiol) .[name]... 1 pv twice a week    Premarin 1.25 Mg Tabs (Estrogens conjugated) .[name]... 1 tab twice weekly  Problem # 7:  RHINITIS, ALLERGIC NOS (ICD-477.9) Stable and doing well.  Problem # 8:   Preventive Health Care (ICD-V70.0) Interval history is benign. Examination is normal. Lab results are within normal limits. She is current with breast cancer screening. Last colonoscopy at age 32, 10 and due for follow-up in 2015. She remains very active. She is seeing both Dr. Despina Hick and Contogiannis for cosmesis. She is exercising as much as ever, although not competing.   In summary- a very nice woman who appears to be medically stable at this time, and in fact is doing very well. She will return as needed or 1 year.   Complete Medication List: 1)  Vagifem 10 Mcg Tabs (Estradiol) .[name].. 1 pv twice a week 2)  Flax Oil (Flaxseed (linseed)) .[name].. 1 tbs once daily 3)  Premarin 1.25 Mg Tabs (Estrogens conjugated) .[name].. 1 tab twice weekly 4)  Roc Retinol Correxion Night Crea (Emollient) .... Use daily 5)  Botox Cosmetic 50 Unit Solr (Onabotulinumtoxina (cosmetic)) .[name].. 1 injection yearly 6)  Zegerid Otc 20-1100 Mg Caps (Omeprazole-sodium bicarbonate) .[name].. 1 by mouth once daily prn 7)  Zithromax Z-pak 250 Mg Tabs (Azithromycin) .... As directed  Patient: [name] Note: All result statuses are Final unless otherwise noted.  Tests: (1) BMP (METABOL)   Sodium                    142 mEq/L                   135-145   Potassium            [H]  5.2 mEq/L                   3.5-5.1   Chloride                  107 mEq/L                   96-112   Carbon Dioxide            31 mEq/L                    19-32   Glucose                   84 mg/dL                    29-56   BUN                       20 mg/dL                    2-13   Creatinine                1.0 mg/dL  0.4-1.2   Calcium                   9.5 mg/dL                   1.6-10.9   GFR                       64.42 mL/min                >60  Tests: (2) Lipid Panel (LIPID)   Cholesterol               195 mg/dL                   6-045     ATP III Classification            Desirable:  < 200 mg/dL                     Borderline High:  200 - 239 mg/dL               High:  > = 240 mg/dL   Triglycerides             50.0 mg/dL                  4.0-981.1     Normal:  <150 mg/dL     Borderline High:  914 - 199 mg/dL   HDL                       78.29 mg/dL                 >56.21   VLDL Cholesterol          10.0 mg/dL                  3.0-86.5   LDL Cholesterol      [H]  784 mg/dL                   6-96  CHO/HDL Ratio:  CHD Risk                             2                    Men          Women     1/2 Average Risk     3.4          3.3     Average Risk          5.0          4.4     2X Average Risk          9.6          7.1     3X Average Risk          15.0          11.0                           Tests: (3) CBC Platelet w/Diff (CBCD)   White Cell Count     [L]  3.3 K/uL                    4.5-10.5   Red Cell Count  3.95 Mil/uL                 3.87-5.11   Hemoglobin                12.8 g/dL                   42.5-95.6   Hematocrit                36.8 %                      36.0-46.0   MCV                       93.2 fl                     78.0-100.0   MCHC                      34.8 g/dL                   38.7-56.4   RDW                       13.1 %                      11.5-14.6   Platelet Count            178.0 K/uL                  150.0-400.0   Neutrophil %         [L]  41.8 %                      43.0-77.0   Lymphocyte %              42.2 %                      12.0-46.0   Monocyte %                8.9 %                       3.0-12.0   Eosinophils%         [H]  5.9 %                       0.0-5.0   Basophils %               1.2 %                       0.0-3.0   Neutrophill Absolute      1.4 K/uL                    1.4-7.7   Lymphocyte Absolute       1.4 K/uL                    0.7-4.0   Monocyte Absolute         0.3 K/uL                    0.1-1.0  Eosinophils, Absolute  0.2 K/uL                    0.0-0.7   Basophils Absolute        0.0 K/uL                     0.0-0.1  Tests: (4) Hepatic/Liver Function Panel (HEPATIC)   Total Bilirubin           0.9 mg/dL                   1.6-0.1   Direct Bilirubin          0.1 mg/dL                   0.9-3.2   Alkaline Phosphatase      58 U/L                      39-117   AST                       21 U/L                      0-37   ALT                       21 U/L                      0-35   Total Protein             7.2 g/dL                    3.5-5.7   Albumin                   4.5 g/dL                    3.2-2.0  Tests: (5) TSH (TSH)   FastTSH                   1.42 uIU/mL                 0.35-5.50  Tests: (6) UDip Only (UDIP)   Color                     LT. YELLOW       RANGE:  Yellow;Lt. Yellow   Clarity                   CLEAR                       Clear   Specific Gravity          1.020                       1.000 - 1.030   Urine Ph                  6.5                         5.0-8.0   Protein                   NEGATIVE  Negative   Urine Glucose             NEGATIVE                    Negative   Ketones                   NEGATIVE                    Negative   Urine Bilirubin           NEGATIVE                    Negative   Blood                     TRACE-LYSED                 Negative   Urobilinogen              0.2                         0.0 - 1.0   Leukocyte Esterace        NEGATIVE                    Negative   Nitrite                   NEGATIVE                    NegativePrescriptions: ZITHROMAX Z-PAK 250 MG TABS (AZITHROMYCIN) as directed  #1 x 0   Entered and Authorized by:   Jacques Navy MD   Signed by:   Jacques Navy MD on 12/12/2009   Method used:   Print then Give to Patient   RxID:   [id number]    Preventive Care Screening  Mammogram:    Date:  03/10/2009    Results:  normal bilateral

## 2010-07-18 NOTE — Assessment & Plan Note (Signed)
Summary: HANDS ARE SWELLING/FOR A WEEK/ BITES ON SHOULDER AND ARM AFTE...   Vital Signs:  Patient profile:   60 year old female Height:      62 inches (157.48 cm) Weight:      107.50 pounds (48.86 kg) O2 Sat:      99 % on Room air Temp:     97.4 degrees F (36.33 degrees C) oral Pulse rate:   62 / minute BP sitting:   118 / 68  (left arm) Cuff size:   regular  Vitals Entered By: Josph Macho RMA (August 25, 2009 8:02 AM)  O2 Flow:  Room air CC: Hands swollen X1week/ Bites on shoulder and arm after beach trip/ CF Is Patient Diabetic? No   Referring Provider:  n/a Primary Provider:  Illene Regulus, MD  CC:  Hands swollen X1week/ Bites on shoulder and arm after beach trip/ CF.  History of Present Illness: pt states 1 week of slight swelling of the hands, and associated "insect bites" on the arms and back.  she says the swelling is mostly on the knuckles, and there is slight associated pain.  Current Medications (verified): 1)  Vagifem 10 Mcg Tabs (Estradiol) .[name].. 1 Pv Twice A Week 2)  Flax   Oil (Flaxseed (Linseed)) .[name].. 1 Tbs Once Daily 3)  Premarin 1.25 Mg Tabs (Estrogens Conjugated) .[name].. 1 Tab Twice Weekly 4)  Roc Retinol Correxion Night  Crea (Emollient) .... Use Daily 5)  Botox Cosmetic 50 Unit Solr (Onabotulinumtoxina (Cosmetic)) .[name].. 1 Injection Yearly  Allergies (verified): 1)  ! * Doxcycline 2)  ! Sulfa 3)  ! Penicillin 4)  ! Codeine 5)  ! Erythromycin  Past History:  Past Medical History: Last updated: 01/31/2009 AORTIC STENOSIS, MILD (ICD-424.1) MITRAL REGURGITATION, 0 (MILD) (ICD-396.3) ANXIETY, SITUATIONAL (ICD-308.3) HEMORRHOIDS-INTERNAL (ICD-455.0) CONSTIPATION (ICD-564.00) IRRITABLE BOWEL SYNDROME (ICD-564.1) RECTAL BLEEDING (ICD-569.3) MICROSCOPIC HEMATURIA (ICD-599.72) RENAL CALCULUS, HX OF (ICD-V13.01) COSMETIC/PLASTIC SURGERY, HX OF (ICD-V50.1)injection treatment fillers/expanders HERPES SIMPLEX WITHOUT MENTION OF COMPLICATION  (ICD-054.9) ESOPHAGEAL REFLUX (ICD-530.81) VAGINITIS, ATROPHIC, MILD (ICD-627.3) BACKACHE NOS (ICD-724.5) RHINITIS, ALLERGIC NOS (ICD-477.9) GLOBUS HYSTERICUS (ICD-300.11) NEPHROLITHIASIS (ICD-592.0)  Review of Systems  The patient denies fever and dyspnea on exertion.    Physical Exam  General:  normal appearance.   Extremities:  hands are normal to my exam. Skin:  there are a few insect bites on the arms and back.   Additional Exam:  LEFT HAND - COMPLETE 3+ VIEW No acute finding.  Normal exam.   Impression & Recommendations:  Problem # 1:  HAND PAIN, BILATERAL (ICD-729.5) uncertain etiology  Problem # 2:  itching uncertain etiology  Other Orders: T-Hand Left 3 Views (73130TC) Est. Patient Level III ([address]109)  Patient Instructions: 1)  x ray of your left hand today. 2)  you can apply your triamcinolone to the insect bite areas three times a day as needed. 3)  tests are being ordered for you today.  a few days after the test(s), please call [phone] to hear your test results. 4)  (update: i left message on phone-tree:  rx as we discussed)

## 2010-07-18 NOTE — Progress Notes (Signed)
  Phone Note Refill Request Message from:  Fax from Pharmacy on February 08, 2010 1:59 PM     New/Updated Medications: OMEPRAZOLE 40 MG CPDR (OMEPRAZOLE) 1 tab once a day Prescriptions: OMEPRAZOLE 40 MG CPDR (OMEPRAZOLE) 1 tab once a day  #30 x 6   Entered by:   Ami Bullins CMA   Authorized by:   Jacques Navy MD   Signed by:   Bill Salinas CMA on 02/08/2010   Method used:   Electronically to        [facility] [phone]* (retail)       [address]       [place], [place]  [address]       Ph: [phone] or [phone]       Fax: [phone]   RxID:   [id number]

## 2010-07-18 NOTE — Progress Notes (Signed)
Summary: REFERRAL  Phone Note Call from Patient Call back at Home Phone [phone]   Summary of Call: Pt spoke with her eye Dr. Quincy Carnes believe that she has fasiculations (?), she says she was dx with this before. She was referred to Dr Marylou Flesher in the past and would like another referral.  Initial call taken by: Lamar Sprinkles, CMA,  July 28, 2009 8:59 AM  Follow-up for Phone Call        OK for reconsult with Dr. Marylou Flesher. [facility] notified Follow-up by: Jacques Navy MD,  July 28, 2009 10:55 AM  Additional Follow-up for Phone Call Additional follow up Details #1::        Patient notified. Additional Follow-up by: Lucious Groves,  July 28, 2009 11:36 AM  New Problems: ABNORMAL INVOLUNTARY MOVEMENTS (ICD-781.0)   New Problems: ABNORMAL INVOLUNTARY MOVEMENTS (ICD-781.0)

## 2010-07-18 NOTE — Assessment & Plan Note (Signed)
Summary: check mole on face/cd   Vital Signs:  Patient profile:   60 year old female Height:      62 inches (157.48 cm) Weight:      103 pounds (46.82 kg) BMI:     18.91 O2 Sat:      97 % on Room air Temp:     98.6 degrees F (37.00 degrees C) oral Pulse rate:   77 / minute BP sitting:   100 / 60  (left arm) Cuff size:   regular  Vitals Entered By: Lucious Groves CMA (February 06, 2010 10:27 AM)  O2 Flow:  Room air CC: Check mole on face and rx request./kb Is Patient Diabetic? No Pain Assessment Patient in pain? no      Comments Patient notes that she would like refill of Premarin and prescriptions for Diflucan and suppositories for hemorrhoids./kb   Primary Care [place]:  Illene Regulus, MD  CC:  Check mole on face and rx request./kb.  History of Present Illness: patient presents with concern for a miniscule dark lesion on her cheek just below the left eye. It has  been present for a couple of week. .  Current Medications (verified): 1)  Vagifem 10 Mcg Tabs (Estradiol) .[name].. 1 Pv Twice A Week 2)  Flax   Oil (Flaxseed (Linseed)) .[name].. 1 Tbs Once Daily 3)  Premarin 1.25 Mg Tabs (Estrogens Conjugated) .[name].. 1 Tab Twice Weekly 4)  Roc Retinol Correxion Night  Crea (Emollient) .... Use Daily 5)  Botox Cosmetic 50 Unit Solr (Onabotulinumtoxina (Cosmetic)) .[name].. 1 Injection Yearly 6)  Zegerid 40-1100 Mg Caps (Omeprazole-Sodium Bicarbonate) .[name].. 1 By Mouth Once Daily Brand Medically Necessary 7)  Zithromax Z-Pak 250 Mg Tabs (Azithromycin) .... As Directed  Allergies (verified): 1)  ! * Doxcycline 2)  ! Sulfa 3)  ! Penicillin 4)  ! Codeine 5)  ! Erythromycin PMH-FH-SH reviewed-no changes except otherwise noted  Review of Systems       The patient complains of suspicious skin lesions.  The patient denies fever, weight loss, and peripheral edema.    Physical Exam  General:  Well-developed,well-nourished,in no acute distress; alert,appropriate and cooperative throughout  examination Skin:  miniscule less than 1 mm dark circular lesion deep in the dermis without varigation, smooth border.   Impression & Recommendations:  Problem # 1:  DYSPLASTIC NEVUS, FACE (ICD-216.3) most likely a melanocytic nevus.  Plan - careful watchful waiting.   Problem # 2:  VAGINITIS, ATROPHIC, MILD (ICD-627.3) Renewed premarin cream.  Her updated medication list for this problem includes:    Vagifem 10 Mcg Tabs (Estradiol) .[name]... 1 pv twice a week    Premarin 0.625 Mg/gm Crea (Estrogens, conjugated) .[name]... 1 vag applicator twice a week  Complete Medication List: 1)  Vagifem 10 Mcg Tabs (Estradiol) .[name].. 1 pv twice a week 2)  Flax Oil (Flaxseed (linseed)) .[name].. 1 tbs once daily 3)  Premarin 0.625 Mg/gm Crea (Estrogens, conjugated) .[name].. 1 vag applicator twice a week 4)  Roc Retinol Correxion Night Crea (Emollient) .... Use daily 5)  Botox Cosmetic 50 Unit Solr (Onabotulinumtoxina (cosmetic)) .[name].. 1 injection yearly 6)  Zegerid 40-1100 Mg Caps (Omeprazole-sodium bicarbonate) .[name].. 1 by mouth once daily brand medically necessary 7)  Zithromax Z-pak 250 Mg Tabs (Azithromycin) .... As directed 8)  Anusol-hc 25 Mg Supp (Hydrocortisone acetate) .[name].. 1 pr two times a day as needed internal hemorrhoids. 9)  Fluconazole 150 Mg Tabs (Fluconazole) .[name].. 1 by mouth x 1 for yeast vaginitis Prescriptions: PREMARIN 0.625  MG/GM CREA (ESTROGENS, CONJUGATED) 1 vag applicator twice a week  #42.5 x 1   Entered and Authorized by:   Jacques Navy MD   Signed by:   Jacques Navy MD on 02/06/2010   Method used:   Electronically to        [facility] [phone]* (retail)       [address]       [place], [place]  [address]       Ph: [phone] or [phone]       Fax: [phone]   RxID:   [id number] FLUCONAZOLE 150 MG TABS (FLUCONAZOLE) 1 by mouth x 1 for yeast vaginitis  #2 x 1   Entered and Authorized by:   Jacques Navy MD   Signed by:   Jacques Navy MD on 02/06/2010   Method  used:   Electronically to        [facility] [phone]* (retail)       [address]       [place], [place]  [address]       Ph: [phone] or [phone]       Fax: [phone]   RxID:   [id number] ANUSOL-HC 25 MG SUPP (HYDROCORTISONE ACETATE) 1 pr two times a day as needed internal hemorrhoids.  #12 x 1   Entered and Authorized by:   Jacques Navy MD   Signed by:   Jacques Navy MD on 02/06/2010   Method used:   Electronically to        [facility] [phone]* (retail)       [address]       [place], [place]  [address]       Ph: [phone] or [phone]       Fax: [phone]   RxID:   [id number]

## 2010-07-18 NOTE — Assessment & Plan Note (Signed)
Summary: F6M/ANAS  Medications Added FLAX   OIL (FLAXSEED (LINSEED)) 1 tbs once daily PREMARIN 1.25 MG TABS (ESTROGENS CONJUGATED) 1 tab twice weekly ROC RETINOL CORREXION NIGHT  CREA (EMOLLIENT) use daily BOTOX COSMETIC 50 UNIT SOLR (ONABOTULINUMTOXINA (COSMETIC)) 1 injection yearly        Visit Type:  6  mo f/u Referring Provider:  n/a Primary Provider:  Illene Regulus, MD  CC:  no cardiac complaints today.  History of Present Illness: [name] returns today for evaluation and management of her mild aortic insufficiency. Her last echocardiogram was August of 2010 which was stable.  She's also had palpitations in the past which have resolved.  She denies any syncope, or syncope, exercise intolerance, without it, PND peripheral edema.  Current Medications (verified): 1)  Vagifem 10 Mcg Tabs (Estradiol) .[name].. 1 Pv Twice A Week 2)  Flax   Oil (Flaxseed (Linseed)) .[name].. 1 Tbs Once Daily 3)  Premarin 1.25 Mg Tabs (Estrogens Conjugated) .[name].. 1 Tab Twice Weekly 4)  Roc Retinol Correxion Night  Crea (Emollient) .... Use Daily 5)  Botox Cosmetic 50 Unit Solr (Onabotulinumtoxina (Cosmetic)) .[name].. 1 Injection Yearly  Allergies: 1)  ! * Doxcycline 2)  ! Sulfa 3)  ! Penicillin 4)  ! Codeine 5)  ! Erythromycin  Past History:  Past Medical History: Last updated: 01/31/2009 AORTIC STENOSIS, MILD (ICD-424.1) MITRAL REGURGITATION, 0 (MILD) (ICD-396.3) ANXIETY, SITUATIONAL (ICD-308.3) HEMORRHOIDS-INTERNAL (ICD-455.0) CONSTIPATION (ICD-564.00) IRRITABLE BOWEL SYNDROME (ICD-564.1) RECTAL BLEEDING (ICD-569.3) MICROSCOPIC HEMATURIA (ICD-599.72) RENAL CALCULUS, HX OF (ICD-V13.01) COSMETIC/PLASTIC SURGERY, HX OF (ICD-V50.1)injection treatment fillers/expanders HERPES SIMPLEX WITHOUT MENTION OF COMPLICATION (ICD-054.9) ESOPHAGEAL REFLUX (ICD-530.81) VAGINITIS, ATROPHIC, MILD (ICD-627.3) BACKACHE NOS (ICD-724.5) RHINITIS, ALLERGIC NOS (ICD-477.9) GLOBUS HYSTERICUS  (ICD-300.11) NEPHROLITHIASIS (ICD-592.0)  Past Surgical History: Last updated: 11/03/2007 Cosmetic procedures with injection therapy wisdom teeth extracted  Family History: Last updated: 01/31/2009 father - '30: healthy, COPD-smoker, lipid mother - '33: healthy, Lipid Neg- breast or colon cancer, DM, CAD  Social History: Last updated: 12/15/2008 Vocational school in [place]. Married ' '90- marriage is in very good shape '09 children 0 Work: Engineer, civil (consulting) avid exerciser, Pharmacist, community Patient has never smoked.  Alcohol Use - no Illicit Drug Use - no  Risk Factors: Caffeine Use: 0 (11/03/2007) Exercise: yes (11/03/2007)  Risk Factors: Smoking Status: never (12/15/2008) Passive Smoke Exposure: no (11/03/2007)  Review of Systems       negative other than history of present illness  Vital Signs:  Patient profile:   60 year old female Height:      62 inches Weight:      106 pounds BMI:     19.46 Pulse rate:   66 / minute Pulse rhythm:   regular BP sitting:   110 / 80  (left arm) Cuff size:   large  Vitals Entered By: [name], CMA (August 02, 2009 11:03 AM)  Physical Exam  General:  Well developed, well nourished, in no acute distress. Head:  normocephalic and atraumatic Eyes:  PERRLA/EOM intact; conjunctiva and lids normal. Mouth:  Teeth, gums and palate normal. Oral mucosa normal. Neck:  Neck supple, no JVD. No masses, thyromegaly or abnormal cervical nodes. Chest [name]:  no deformities or breast masses noted Lungs:  Clear bilaterally to auscultation and percussion. Heart:  regular rate and rhythm, normal S1-S2, no diastolic murmur appreciated Msk:  Back normal, normal gait. Muscle strength and tone normal. Pulses:  pulses normal in all 4 extremities Extremities:  No clubbing or cyanosis. Neurologic:  Alert and oriented x 3.  Skin:  Intact without lesions or rashes. Psych:  Normal affect.   Impression & Recommendations:  Problem # 1:  AORTIC  INSUFFICIENCY, MILD (ICD-424.1) Assessment Unchanged I will see her back in August and at that time repeat an echocardiogram. Reassurance given. No SBE prophylaxis indicated.  Problem # 2:  PALPITATIONS (ICD-785.1) Assessment: Improved  Patient Instructions: 1)  Your physician recommends that you schedule a follow-up appointment in: YEAR WITH DR [name] 2)  Your physician recommends that you continue on your current medications as directed. Please refer to the Current Medication list given to you today.

## 2010-07-18 NOTE — Progress Notes (Signed)
Summary: Schedule Colonoscopy   Phone Note Outgoing Call Call back at [facility] Phone [phone]   Call placed by: Harlow Mares CMA Duncan Dull),  August 16, 2009 4:15 PM Call placed to: Patient Summary of Call: spoke to patient and she will call back to schedule her colonoscopy. Initial call taken by: Harlow Mares CMA (AAMA),  August 16, 2009 4:17 PM

## 2010-07-18 NOTE — Letter (Signed)
   [facility] [address] [place], [place]  [address] Phone: [phone]      December 18, 2009   [facility] [address] [place], [place] [address]  RE:  LAB RESULTS  Dear  Ms. Stidd,  The following is an interpretation of your most recent lab tests.  Please take note of any instructions provided or changes to medications that have resulted from your lab work.  Pap Smear: normal      Sincerely Yours,    Jacques Navy MD

## 2010-07-18 NOTE — Letter (Signed)
Summary: [facility]  [facility]   Imported By: Sherian Rein 09/20/2009 10:03:55  _____________________________________________________________________  External Attachment:    Type:   Image     Comment:   External Document
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
## 2010-08-04 ENCOUNTER — Telehealth: Payer: Self-pay | Admitting: Internal Medicine

## 2010-08-09 NOTE — Progress Notes (Signed)
Summary: REFERRAL  Phone Note Call from Patient Call back at Home Phone [phone]   Summary of Call: Pt has been doing reccomended exercises MD suggested. She is req referral for PT to help further.  Initial call taken by: Lamar Sprinkles, CMA,  August 04, 2010 9:10 AM  Follow-up for Phone Call        ok for referral to PT - integrative therapies if covered. [facility] notified Follow-up by: Jacques Navy MD,  August 04, 2010 10:52 AM

## 2010-08-18 ENCOUNTER — Telehealth: Payer: Self-pay | Admitting: Internal Medicine

## 2010-08-21 ENCOUNTER — Encounter: Payer: Self-pay | Admitting: Internal Medicine

## 2010-08-24 NOTE — Progress Notes (Signed)
Summary: Med inquiry  Phone Note Call from Patient Call back at Home Phone [phone]   Caller: Patient Call For: Med Inquiry Summary of Call: Pt states that she has been having aura headaches. She states she has been keeping a log of the occurances and thinks that she may have found the link. Pt states that she uses Premarin vaginal cream and has noticed that she has these episodes (30 minutes or so) the day after using the cream. She also states that when she was young she had to be taken off of BC pills because they were causing severe headaches and wonders if the estrogen is the cause of her vision problems and headaches now. She would like your advice as to how she should proceed (or not) with use of the Premarin cream. Initial call taken by: Burnard Leigh [facility]),  August 18, 2010 8:37 AM  Follow-up for Phone Call        although the systemic absorption of estrogen from the vaginal cream is low it is not neglible. Recommend stopping the premarin cream. See, if over the next 7-14 days the HA goes away. If they do we can try an alternative, i.e. estradiol. Follow-up by: Jacques Navy MD,  August 18, 2010 9:32 AM  Additional Follow-up for Phone Call Additional follow up Details #1::        Pt informed, she will call office back w/update Additional Follow-up by: Lamar Sprinkles, CMA,  August 18, 2010 3:15 PM    New/Updated Medications: PREMARIN 0.625 MG/GM CREA (ESTROGENS, CONJUGATED) 1 vag applicator twice a week -HOLD

## 2010-09-05 ENCOUNTER — Ambulatory Visit (INDEPENDENT_AMBULATORY_CARE_PROVIDER_SITE_OTHER): Payer: BC Managed Care – PPO | Admitting: Internal Medicine

## 2010-09-05 VITALS — BP 122/80 | HR 71 | Temp 98.2°F | Ht 62.0 in | Wt 105.0 lb

## 2010-09-05 DIAGNOSIS — L03019 Cellulitis of unspecified finger: Secondary | ICD-10-CM

## 2010-09-06 ENCOUNTER — Ambulatory Visit: Payer: Self-pay | Admitting: Internal Medicine

## 2010-09-06 NOTE — Progress Notes (Signed)
  Subjective:    Patient ID: [name], female    DOB: 1951-01-01, 60 y.o.   MRN: [id number]  HPI Mrs. Causby presents complaining of a sore distal right index finger. She reports that several days ago she pulled loose a hangnail from this finger. Soon thereafter she developed mild swelling and pain in the distal finger around the nail bed. She has taken no medication for this and initiated no treatment. She has had no fever, chills, loss of ROM.  PMH reviewed for relevance  Soc Hx - no change    Review of Systems  Constitutional: Negative for fever, chills and activity change.  HENT: Negative.   Eyes: Negative.   Respiratory: Negative.   Cardiovascular: Negative.   Musculoskeletal: Negative.   Neurological: Negative.        Objective:   Physical Exam  Constitutional: She appears well-developed and well-nourished.  HENT:  Head: Normocephalic and atraumatic.  Eyes: Conjunctivae and EOM are normal.  Cardiovascular: Normal rate and regular rhythm.   Pulmonary/Chest: Effort normal.  Musculoskeletal:       Distal right index finger at the nail bed is mildly swollen and erythematous. No "pointing." No sign of drainage. No ascending erythema. Nail bed appears normal          Assessment & Plan:  1. Paronychia - index finger without signs of ascending infection, no sign of pus collection that would require I&D.  Plan - betadine soak in very warm water 3 times a day            Call for increasing swelling,, pain, "pointing," fevers           APAP for pain prn

## 2010-09-07 ENCOUNTER — Telehealth: Payer: Self-pay | Admitting: *Deleted

## 2010-09-07 ENCOUNTER — Ambulatory Visit: Payer: BC Managed Care – PPO | Admitting: Internal Medicine

## 2010-09-07 ENCOUNTER — Ambulatory Visit: Payer: Self-pay | Admitting: Internal Medicine

## 2010-09-07 ENCOUNTER — Telehealth: Payer: Self-pay | Admitting: Internal Medicine

## 2010-09-07 DIAGNOSIS — IMO0001 Reserved for inherently not codable concepts without codable children: Secondary | ICD-10-CM

## 2010-09-07 MED ORDER — MOXIFLOXACIN HCL 400 MG PO TABS: 400.0000 mg | ORAL_TABLET | Freq: Every day | ORAL | Status: DC

## 2010-09-07 NOTE — Telephone Encounter (Signed)
absolutely

## 2010-09-07 NOTE — Telephone Encounter (Signed)
patine seen informally for continued swelling of the distal aspect of the right index finger,. She was diagnosed with paronycia. She has been soaking the finger tid. There is increased swelling and discomfort. No fever or chills, no ascending erythema. No drainage.   Plan - will continue soaks            No need of I&D            Antibiotics - avelox 400mg  qd x 5

## 2010-09-07 NOTE — Telephone Encounter (Signed)
Patient requesting to know if it is ok to go to therapy (for her neck) while on avelox? Also should she be soaking finger in betadine?

## 2010-09-07 NOTE — Telephone Encounter (Signed)
Left detailed vm for pt.

## 2010-09-08 ENCOUNTER — Encounter: Payer: Self-pay | Admitting: Internal Medicine

## 2010-09-08 DIAGNOSIS — L03019 Cellulitis of unspecified finger: Secondary | ICD-10-CM

## 2010-09-08 NOTE — Progress Notes (Unsigned)
  Subjective:    Patient ID: [name], female    DOB: [birth year], 60 y.o.   MRN: [id number]  HPI  Mrs. Paugh was seen 3/20 for a paronycia of the right index finger. This has continued to be a problem despite warm betadine soaks and the addition of Avelox 400mg  qd on 3/22. She presents today for increased swelling, redness and pain in the distal phalanx.  PMH reviewed for changes and relevance.   Review of Systems  Constitutional: Negative.   Cardiovascular: Negative.   Skin:       [Redness and pain in the distal right index finger.      Objective:   Physical Exam  Constitutional: She appears well-developed and well-nourished.  Skin: Skin is warm and dry. There is erythema.       Distal aspect of the index finger is swollen, tense with pointing, redness and tenderness          Assessment & Plan:  1. Paronychia - with patient informed consent the tip of the finger was prepped with betadiene followed by alcholol. Topical anesthesia obtrained with freeze spray. Using a #11 scalpel a 5mm incision was made with release of 1.5 cc purulent material. Bandaid applied.  Plan - patient to continue betadine soaks            Complete full course of Avelox            Call for worsening: swelling, redness, fever.

## 2010-09-11 ENCOUNTER — Telehealth: Payer: Self-pay | Admitting: Internal Medicine

## 2010-09-11 DIAGNOSIS — IMO0001 Reserved for inherently not codable concepts without codable children: Secondary | ICD-10-CM

## 2010-09-11 MED ORDER — MOXIFLOXACIN HCL 400 MG PO TABS: 400.0000 mg | ORAL_TABLET | Freq: Every day | ORAL | Status: AC

## 2010-09-11 NOTE — Telephone Encounter (Signed)
Pt informed of new Rx and MD advice. Pt will contact us if there is any notice of pus accumulation.

## 2010-09-11 NOTE — Telephone Encounter (Signed)
Pt states that she was in on Friday for finger procedure and was given Avelox. Pt states that she only has (1) Avelox left and feels that she may need more due to finger tip being red, warm, "tight w/pressure". Pt states she has been soaking 3x daily w/betadine and keeping finger covered w/bandaid while at work. Please advise.

## 2010-09-11 NOTE — Telephone Encounter (Signed)
1. May extend the avelox for another 5 days - call in Rx # 5 400mg  tabs 2. Does it appear that there is a reaccumlation of pus in the finger tip that would require repeat intervention

## 2010-09-14 NOTE — Miscellaneous (Signed)
Summary: Plan of Treatment / Integrative Therapies  Plan of Treatment / Integrative Therapies   Imported By: Lennie Odor 09/04/2010 11:05:05  _____________________________________________________________________  External Attachment:    Type:   Image     Comment:   External Document

## 2010-09-15 ENCOUNTER — Telehealth: Payer: Self-pay | Admitting: Internal Medicine

## 2010-09-15 NOTE — Telephone Encounter (Signed)
Sounds like it is getting better. If she wants to ome by for a free quick look this PM that is good with me.

## 2010-09-15 NOTE — Telephone Encounter (Signed)
Pt states that she has [2] Avelox left to take. Pt states her finger is 99% better - still has tightness on end and when bending & pressure on fingernail. Pt would just like to know is this is normal for healing process.?

## 2010-09-18 NOTE — Telephone Encounter (Signed)
Pt will be in at 01:00pm today for check.MD & nurse informed.

## 2010-09-18 NOTE — Telephone Encounter (Signed)
LMOM for Pt to inform that she can stop by for Dr Debby Bud to take a look at finger in afternoon if she chooses to do so.

## 2010-10-06 ENCOUNTER — Telehealth: Payer: Self-pay | Admitting: Internal Medicine

## 2010-10-06 NOTE — Telephone Encounter (Signed)
Pt informed

## 2010-10-06 NOTE — Telephone Encounter (Signed)
Pt states that since having finger/nail infection approximately 1 mth ago, she is still having tightness & tenderness on end and tenderness on top joint when bending.  Pt would just like to know is this is normal for healing process.?

## 2010-10-06 NOTE — Telephone Encounter (Signed)
Based on how it looked on last exam I do not think there is an active problem. If it continue to be painful would refer to [facility]

## 2010-10-31 NOTE — Assessment & Plan Note (Signed)
[facility]                         GASTROENTEROLOGY OFFICE NOTE   NAME:[name], [name]                   MRN:          [id number]  DATE:09/17/2007                            DOB:          [birth year]    The patient is self-referred.   HISTORY:  [name] presents today with multiple abdominal complaints.  She is a 60 year old with a history of constipation-predominant  irritable bowel syndrome and possible reflux disease.  She was last  evaluated in the office Nov 12, 2006, for problems with constipation,  bloating, and abdominal discomfort.  See that dictation for details.  She was to continue MiraLax for constipation and given Levsin for  discomfort as needed.  She states she did not tolerate the Levsin.  Tissue transglutaminase antibody was negative.  She tells me that she  has had ongoing problems with postprandial bloating.  She has  constipation greater than diarrhea.  Occasional diarrhea with urgency.  For constipation, she uses MiraLax, which helps.  No medication is  needed for diarrhea.  She has tried fiber in the past, but was uncertain  about its benefit.  Otherwise, she has been stable.  She is due for her  annual evaluation with Dr. Debby Bud in the next few weeks.   CURRENT MEDICATIONS:  Are listed as vitamin C, multivitamin, Premarin,  Citrucel, vitamin B, flax seed oil, Bentyl, Prevacid and magnesium.   PHYSICAL EXAM:  Finds a well-appearing female, in no acute distress.  Blood pressure is 110/56, heart rate 68, weight is 102.6 pounds.  HEENT:  Sclerae anicteric.  Conjunctivae are pink.  Oral mucosa is  intact, no adenopathy.  LUNGS:  Clear.  HEART:  Regular.  ABDOMEN:  Soft without tenderness, mass or hernia.  EXTREMITIES:  Without edema.   IMPRESSION:  1. Constipation-predominant irritable bowel syndrome.  Ongoing.  2. Abdominal discomfort, likely functional.   RECOMMENDATIONS:  1. Continue MiraLax as needed for bowels.  2. Initiate daily fiber supplementation in the form of Metamucil one      tablespoon.  3. Continue proton pump inhibitor as needed for relief of epigastric      discomfort.  4. Ongoing general medical care with Dr. Debby Bud.  5. Gastroenterology followup p.r.n.     Wilhemina Bonito. Marina Goodell, MD  Electronically Signed    JNP/MedQ  DD: 09/17/2007  DT: 09/17/2007  Job #: [id number]   cc:   Rosalyn Gess. Norins, MD

## 2010-10-31 NOTE — Assessment & Plan Note (Signed)
[facility]                            CARDIOLOGY OFFICE NOTE   NAME:[name], [name]                   MRN:          [id number]  DATE:08/12/2007                            DOB:          September 12, 1950    [name] returns today for further management of her mild aortic  insufficiency.   She has done remarkably well and continues to work out vigorously at  [employer].  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   She denies any complaints today.   She is on no cardiac medications.   She saw Dr. Charlies Constable on January 28, 2007 in my absence.  He requested  a 2-D echocardiogram, which showed mild aortic insufficiency and no  change.   Her blood pressure today is 107/64, pulse 61 and regular.  She is in  sinus; EKG confirms.  HEENT:  Unchanged.  Carotids are full.  No bruits.  Thyroid is not  enlarged.  Trachea is midline.  There is no JVD.  LUNGS:  Clear.  HEART:  Reveals a nondisplaced PMI.  She has normal S1 and S2.  I could  not hear any significant AI.  ABDOMEN:  Soft; good bowel sounds.  No midline bruit.  No hepatomegaly.  EXTREMITIES:  No edema.  Pulses intact.  NEUROLOGIC:  Intact.   [name] is doing well.  We have obviously made no changes in her  medical program.  She no longer needs an SBE prophylaxis.  Will see her  back in August, at which time she will need a 2-D echocardiogram.     Maisie Fus C. Daleen Squibb, MD, [facility]  Electronically Signed    TCW/MedQ  DD: 08/12/2007  DT: 08/12/2007  Job #: [id number]

## 2010-10-31 NOTE — Assessment & Plan Note (Signed)
[facility]                            CARDIOLOGY OFFICE NOTE   NAME:[name], [name]                   MRN:          [id number]  DATE:08/04/2008                            DOB:          05/24/1951    [name] comes in today for followup of her mild aortic  insufficiency.  She has had some intermittent palpitations in the past.  Fortunately, these have been quiescent.   She ran into a glass shelf at [employer] [address] on July 10, 2008.  She went to the emergency room.  Fortunately, she did not require  any suturing, but did have a significant bruise to her head.  She is  still recovering from this.  She did not suffer any concussions or other  symptoms.   She continues to work out on a regular basis.  She is having no dyspnea  on exertion.  No orthopnea or tachypalpitations.  No syncope or  presyncope.   She is on no cardiac meds.   She has had some atypical chest pain under her left breast.  It does not  hurt with a deep breath.  Dr. Debby Bud thought it was a heavy pocket book  with a brass handle.  She has had a recent mammogram, which was  negative.   PHYSICAL EXAMINATION:  VITAL SIGNS:  Her blood pressure today is 114/60,  her pulse is 70 and regular.  Weight is 106.  HEENT:  Normal.  NECK:  Carotid upstrokes were equal bilaterally without bruits.  No JVD.  Thyroid is not enlarged.  Trachea is midline.  LUNGS:  Clear to auscultation and percussion.  No rub.  Full breath  sounds throughout.  CARDIAC:  She has a nondisplaced PMI.  Normal S1 and S2.  Soft systolic  murmur along the left sternal border.  S2 splits physiologically.  I  could not appreciate any significant diastolic component.  ABDOMEN:  Soft, good bowel sounds.  There is no right upper quadrant or  left upper quadrant tenderness.  There is no distention.  EXTREMITIES:  No cyanosis, clubbing, or edema.  Pulses are present.  NEUROLOGIC:  Intact.  SKIN:  She has  this resolving ecchymosis of the right parafrontal area.   EKG is normal except for mild rightward axis.   ASSESSMENT AND PLAN:  [name]'s aortic insufficiency is stable.  She has noncardiac chest pain.  We have made no changes in our  recommendations.  We will plan on seeing her back again in 6 months.  At  that time, she will need a 2-D echocardiogram.     Jesse Sans. Daleen Squibb, MD, [facility]  Electronically Signed    TCW/MedQ  DD: 08/04/2008  DT: 08/05/2008  Job #: [id number]

## 2010-10-31 NOTE — Assessment & Plan Note (Signed)
[facility]                            CARDIOLOGY OFFICE NOTE   NAME:[name], ASTHA                   MRN:          [id number]  DATE:02/02/2008                            DOB:          Jul 03, 1950    Bryannah returns today for further management of mild aortic  insufficiency.   She continues to work out vigorously at [employer].  She is having no  symptoms of angina, shortness of breath, tachy palpitations, presyncope,  and syncope.  She denies any orthopnea or PND.   She has a 2D echocardiogram in July 2009, which showed mild aortic  insufficiency, otherwise normal.  This is stable.   She is on no cardiac meds.   Her exam, her blood pressure 113/67, pulse 65 and regular, weight is  106.  HEENT is unchanged.  Carotids are full without bruits, no JVD.  Thyroid is not enlarged.  Trachea is midline.  Lungs are clear.  Heart  reveals a regular rate and rhythm.  I could barely hear diastolic  murmur.  PMI is normal.  There is no gallop.  Abdominal exam is soft,  good bowel sounds.  No midline bruit.  Extremities, there is no  cyanosis, clubbing, or edema.  Pulses are intact.  Neuro exam is intact.   Jennifermarie is doing remarkably well.  I have made no changes in her  medical program, obviously.  I will see her back in 6 months.  We will  do an EKG at that time.     Thomas C. Daleen Squibb, MD, [facility]  Electronically Signed    TCW/MedQ  DD: 02/02/2008  DT: 02/03/2008  Job #: [id number]

## 2010-10-31 NOTE — Assessment & Plan Note (Signed)
[facility]                         GASTROENTEROLOGY OFFICE NOTE   NAME:[name], LACYE                   MRN:          [id number]  DATE:11/12/2006                            DOB:          May 19, 1951    HISTORY:  Shian presents today with ongoing chronic abdominal  complaints. She is a 60 year old with a history of constipation  predominant irritable bowel syndrome. She was last evaluated August 28, 2006. At that time, she complained of constipation and bloating. She was  given a trial of Align for two weeks and also MiraLax. She initially  felt that this was helpful. However, she states Align is not helpful.  She has placed herself on a number of different diets, but cannot  consistently find any one diet that improves symptoms.   CHIEF COMPLAINT:  Today is that of post-prandial bloating and fullness.  She describes a firm abdomen. Her bowel habits have improved on MiraLax  however. No vomiting, bleeding or weight loss.   CURRENT MEDICATIONS:  1. Premarin cream.  2. She also takes Citrucel with Caltrate.  3. Vitamin B.  4. Flax seed oil.  5. Vitamin C.  6. Multivitamin.   PHYSICAL EXAMINATION:  Finds a well-appearing female in no acute  distress. Blood pressure is 100/68, heart rate 72, weight is 104.2  pounds.  HEENT: Sclerae anicteric.  ABDOMEN: Soft without tenderness, mass or hernia. Good bowel sounds  heard.   IMPRESSION:  Constipation predominant irritable bowel syndrome, ongoing.   RECOMMENDATIONS:  1. Continue MiraLax for constipation.  2. Trial of Levsin sublingual 0.125 mg q4 hours p.r.n.  3. Check tissues transglutaminase antibody per the patient request to      exclude occult sprue.  4. Followup as needed.     Wilhemina Bonito. Marina Goodell, MD  Electronically Signed    JNP/MedQ  DD: 11/12/2006  DT: 11/12/2006  Job #: [id number]   cc:   Rosalyn Gess. Norins, MD

## 2010-10-31 NOTE — Assessment & Plan Note (Signed)
[facility]                            CARDIOLOGY OFFICE NOTE   NAME:[name], [name]                   MRN:          [id number]  DATE:01/28/2007                            DOB:          1950-11-12    PRIMARY CARE PHYSICIAN:  Rosalyn Gess. Norins, MD   CARDIOLOGIST:  Jesse Sans. Wall, MD, [facility]   CLINICAL HISTORY:  Ms. Moosman is 60 years old and has been followed by  Dr.  Daleen Squibb with mild aortic insufficiency. She is quite active and  exercises regularly and this weekend while she was walking with a friend  at the park, up a hill, she got more short of breath than usual. Later  that evening, she noticed some unusual sensation in her right neck which  she describes as a fullness and a bulging. She tried to see if this was  related to her heartbeat and did not think that it was. She was  concerned and she went to urgent medical care and they did a chest x-ray  and told her that her right atrium appeared enlarged on x-ray. They  recommended followup with Dr.  Daleen Squibb and she comes in today to followup  regarding this.   She has had no chest pain and no palpitations.   PAST MEDICAL HISTORY:  1. Nephrolithiasis.  2. Chronic allergies.   CURRENT MEDICATIONS:  Include only vitamins and Premarin.   PHYSICAL EXAMINATION:  Blood pressure is 130/80, pulse 64 and regular.  There was no venous distention. The carotid pulses were full without  bruits. I could feel no abnormality in her neck and specifically no  adenopathy.  CHEST: Was clear without rales or rhonchi.  CARDIAC: Rhythm was regular. The heart sounds were normal and there was  a grade 2/6 early diastolic murmur at the left sternal edge.  ABDOMEN: Was soft with normal bowel sounds. There was no  hepatosplenomegaly.  The peripheral pulses were full and there was no peripheral edema.   An electrocardiogram was normal.   IMPRESSION:  1. Chronic mild aortic insufficiency.  2. Symptoms of dyspnea on  exertion and fullness in the right neck of      uncertain etiology.  3. Reportedly enlarged right atrium by chest x-ray at urgent medical      center.   RECOMMENDATIONS:  I am not certain regarding the etiology of Ms.  Stauffer's neck symptoms. I do not feel anything abnormal on physical  examination today. Possible it might be muscular fasciculations. With  her dyspnea on exertion and her abnormality on chest x-ray, will plan to  repeat her echocardiogram. Will especially look at her right atrial size  which is normal on  her echocardiogram in January. Will also reevaluate for aortic  insufficiency. If her echo looks unchanged, then will have her see Dr.  Daleen Squibb at the time of her usual followup.     Bruce Elvera Lennox Juanda Chance, MD, [facility]  Electronically Signed    BRB/MedQ  DD: 01/28/2007  DT: 01/29/2007  Job #: [id number]

## 2010-11-01 ENCOUNTER — Telehealth: Payer: Self-pay | Admitting: *Deleted

## 2010-11-01 MED ORDER — ALPRAZOLAM 0.5 MG PO TABS: 0.5000 mg | ORAL_TABLET | Freq: Four times a day (QID) | ORAL | Status: DC | PRN

## 2010-11-01 NOTE — Telephone Encounter (Signed)
Ok to refill zegerid prn  Ok for xanax 0.5 mg 1 po q6 prn #90 1 refill

## 2010-11-01 NOTE — Telephone Encounter (Signed)
Xanax called in. She has zegerid, and has been back on med x 3 wks. She continues to have slight tight feeling in her chest during the day. She feels it may be from stress or the zegerid is not working as well as in the past. She will try xanax and call back w/any change in symptoms.

## 2010-11-01 NOTE — Telephone Encounter (Signed)
Patient requesting RX rfs.  1. Zegerid - OK?  2. RF - Xanax 0.5 mg (rx was last given 05/2009) She is c/o increased stress from dealing with her sick father in [place].

## 2010-11-03 NOTE — Assessment & Plan Note (Signed)
[facility]                           PRIMARY CARE OFFICE NOTE   NAME:MCCULLEYDafna, Romo                   MRN:          [id number]  DATE:07/02/2006                            DOB:          1950/10/11    CONTINUATION  Last mammogram on the chart is from February 13, 2005.  Last bone density  study from December 22, 2004 revealed the patient had a improved T score from  2004-2006 going from -1.336 to -0.955.  At the left hip T score went  from -1.04 to -1.53, at the left femoral neck the study went from -1.57  to -1.17.  Last EKG from August 23, 2005 was a normal sinus rhythm.   PHYSICAL EXAMINATION:  VITAL SIGNS:  Temperature 98.8, blood pressure  110/68, pulse 66, rate 105.  GENERAL APPEARANCE:  This is an athletic appearing woman, looking  younger than her chronologic age, in no acute distress.  HEENT:  Normocephalic, atraumatic.  EACs and TMs were normal.  Oropharynx with native dentition and excellent repair, no buccal or  palate regions were noted.  Posterior pharynx was clear, conjunctivae  and sclerae were clear, PERRLA, EOMI, funduscopic exam was unremarkable.  NECK:  Supple, without thyromegaly nodes, no adenopathy was noted in the  cervical or supraclavicular regions.  CHEST:  No CVA tenderness.  LUNGS:  Clear to auscultation and percussion.  CARDIOVASCULAR:  A 2+ radial pulse, no JVD, no carotid bruit.  She had a  quiet precordium with a regular rate and rhythm without murmurs, rubs or  gallops.  BREAST:  Revealed normal skin, nipples without discharge.  No fixed  mass, lesions or abnormalities were noted.  ABDOMEN:  Soft, no guarding, no rebound, no organosplenomegaly was  noted.  PELVIC:  NATBUS was normal.  Vaginal mucosa was pink and moist and  normal in appearance.  The patient has thick exudate/discharge in the  posterior fornix.  Cervix appeared nulliparous, past scraping and  endocervical brushes were performed without difficulty.   Bimanual exam  revealed no cervical motion tenderness, there was no adnexal enlargement  or abnormality.  EXTREMITIES:  Without clubbing, cyanosis, edema or deformity.  NEUROLOGIC:  Non-focal.  SKIN:  Clear.   DATABASE:  Hemoglobin 13.4 g , white count was 4400 with a normal  differential, cholesterol is 184, triglycerides 66, HDL 75.2, LDL was  96.  Chemistries were normal, with glucose of 91. Electrolytes were  normal.  Kidney function normal with a creatinine of 0.8 and a GFR of  80.  Liver functions were normal.  TSH was normal at 1.75.  Urinalysis  was negative.   ASSESSMENT/PLAN:  1. Cardiovascular.  Patient with known aortic stenosis.  The echo was      reviewed.  The patient was inquiring about new guidelines from      American Heart Association and subacute bacterial endocarditis      prophylaxis.  She was provided a copy from up to date on these      recommendations.  Clearly she is not a high risk patient and      therefore subacute bacterial endocarditis prophylaxis is no  longer      recommended.  Plan:  Patient to continue seeing Dr. Daleen Squibb on regular      basis.  2. Bone density.  Patient has mild osteopenia.  In comparing her      studies from 2002-2004-2006 the patient has actually been very      stable with an actual improvement in bone density, both the femoral      neck and the PA spine.  I do not have the recent study performed by      Dr. Lily Peer.  At this point I would recommend the patient      continue with her exercise regimen with calcium replacement, but      would not recommend any medical therapy.  3. Allergies.  Patient is currently stable, follows with Dr. Stevphen Rochester and has monthly injections.  4. Health maintenance.  The patient's is currently up to date with      colorectal cancer screening.  Pap smear is performed today.  She      does need to get a mammogram.   The patient has been seeing Durwin Nora for dietary, calcium   therapy.  She is using Activa yogurt and is on a wheat free diet.  She  reports her symptoms of irritable bowel syndrome are markedly improved.  Plan:  Patient to continue on her lifestyle and diet management which is  having such positive effects for her.   In summary this is a women in excellent health.  She has asked to return  to see me on a p.r.n. basis or in one year.     Rosalyn Gess Norins, MD     MEN/MedQ  DD: 07/02/2006  DT: 07/02/2006  Job #: [id number]   cc:   Gaetano Hawthorne. Lily Peer, M.D.  [name]

## 2010-11-03 NOTE — Assessment & Plan Note (Signed)
[facility]                           PRIMARY CARE OFFICE NOTE   NAME:MCCULLEYLucindia, [name]                   MRN:          [id number]  DATE:07/01/2006                            DOB:          [birth year]    [name] is a delightful 60 year old woman who presents today for an  annual physical exam and evaluation.  She was last seen in the office  May 17, 2006 for a muscle strain.  In the interval since that  visit, patient called the office for yeast infection.  Clerical staff  referred to her GYN and she has seen Dr. Reynaldo Minium.  She was  diagnosed and treated for a yeast infection.  Patient also had routine  laboratory with the results not available to me.  Patient had a bone  density study, results not available to me.  Patient reports she is  feeling well and doing well, has no active medical complaints or  problems at this time.   PAST MEDICAL HISTORY:   SURGICAL HISTORY:  Wisdom teeth extraction.   MEDICAL HISTORY:  1. Usual childhood disease.  2. Menarche at age 19.  3. Nephrolithiasis with spontaneous stone passage at 89.  4. Globus hystericus.  5. Mild aortic stenosis.  6. Trace mitral regurgitation.  7. Chronic allergies.  8. Cosmetic procedures with injection therapy.   PHYSICIAN ROSTER:  Dr. Daleen Squibb for cardiology, Dr. Stevphen Rochester for  allergies.   CURRENT MEDICATIONS:  No prescription drugs except for Premarin cream  vaginally once a week.  She takes vitamins, C, multivitamins, Citracal,  B100, flaxseed oil.   FAMILY HISTORY:  Noncontributory.   SOCIAL HISTORY:  Patient has reconciled with her husband.  She has  continued to work, enjoys her job in a [employer] early [employer].  Patient continues to work out on a regular basis, although she is no  longer doing competitions.  She reports that her situation is very, very  positive at this time.   REVIEW OF SYSTEMS:  Negative for any constitutional,  cardiovascular,  respiratory, GI or GU problems.   CHART REVIEW:  Last myocardial perfusion study from August of 2005 was a  normal study.  Last colonoscopy, Nov 02, 2003, was a normal study.  Patient had esophageal manometry in 2000, which was a normal study.   Last 2D echo, February of 2007, showed mild aortic regurgitation.  Left  ventricle was normal.  Left ventricular systolic function was normal  with an EF of 55%-60%.  Aortic valve was trileaflet, somewhat smaller  appearing left coronary cusp.  Normal aortic valve leaflet excursion.  Mild aortic valvular regurgitation.  There was trivial mitral valvular  regurgitation with normal mitral valve leaflet excursion.  Patient had  mild tricuspid valve regurgitation.  Last  report from Oct 25, 2005 was  normal with no abnormalities found.   Last MRI of the brain without contrast, Oct 26, 2005, was negative with  no acute findings or abnormalities.  Last mammogram on the chart is from  February 13, 2005.  Last bone density study from December 22, 2004 revealed the  patient had a improved  T score from 2004-2006 going from -1.336 to -  0.955.  At the left hip T score went from -1.04 to -1.53, at the left  femoral neck the study went from -1.57 to -1.17.  Last EKG from August 23, 2005 was a normal sinus rhythm.   PHYSICAL EXAMINATION:  VITAL SIGNS:  Temperature 98.8, blood pressure  110/68, pulse 66, rate 105.  GENERAL APPEARANCE:  This is an athletic appearing woman, looking  younger than her chronologic age, in no acute distress.  HEENT:  Normocephalic, atraumatic.  EACs and TMs were normal.  Oropharynx with native dentition and excellent repair, no buccal or  palate regions were noted.  Posterior pharynx was clear, conjunctivae  and sclerae were clear, PERRLA, EOMI, funduscopic exam was unremarkable.  NECK:  Supple, without thyromegaly nodes, no adenopathy was noted in the  cervical or supraclavicular regions.  CHEST:  No CVA tenderness.   LUNGS:  Clear to auscultation and percussion.  CARDIOVASCULAR:  A 2+ radial pulse, no JVD, no carotid bruit.  She had a  quiet precordium with a regular rate and rhythm without murmurs, rubs or  gallops.  BREAST:  Revealed normal skin, nipples without discharge.  No fixed  mass, lesions or abnormalities were noted.  ABDOMEN:  Soft, no guarding, no rebound, no organosplenomegaly was  noted.  PELVIC:  NATBUS was normal.  Vaginal mucosa was pink and moist and  normal in appearance.  The patient has thick exudate/discharge in the  posterior fornix.  Cervix appeared nulliparous, past scraping and  endocervical brushes were performed without difficulty.  Bimanual exam  revealed no cervical motion tenderness, there was no adnexal enlargement  or abnormality.  EXTREMITIES:  Without clubbing, cyanosis, edema or deformity.  NEUROLOGIC:  Non-focal.  SKIN:  Clear.   DATABASE:  Hemoglobin 13.4 g , white count was 4400 with a normal  differential, cholesterol is 184, triglycerides 66, HDL 75.2, LDL was  96.  Chemistries were normal, with glucose of 91. Electrolytes were  normal.  Kidney function normal with a creatinine of 0.8 and a GFR of  80.  Liver functions were normal.  TSH was normal at 1.75.  Urinalysis  was negative.   ASSESSMENT/PLAN:  1. Cardiovascular.  Patient with known aortic stenosis.  The echo was      reviewed.  The patient was inquiring about new guidelines from      American Heart Association and subacute bacterial endocarditis      prophylaxis.  She was provided a copy from up to date on these      recommendations.  Clearly she is not a high risk patient and      therefore subacute bacterial endocarditis prophylaxis is no longer      recommended.  Plan:  Patient to continue seeing Dr. Daleen Squibb on regular      basis.  2. Bone density.  Patient has mild osteopenia.  In comparing her     studies from 2002-2004-2006 the patient has actually been very      stable with an actual  improvement in bone density, both the femoral      neck and the PA spine.  I do not have the recent study performed by      Dr. Lily Peer.  At this point I would recommend the patient      continue with her exercise regimen with calcium replacement, but      would not recommend any medical therapy.  3. Allergies.  Patient is currently  stable, follows with Dr. Stevphen Rochester and has monthly injections.  4. Health maintenance.  The patient's is currently up to date with      colorectal cancer screening.  Pap smear is performed today.  She      does need to get a mammogram.   The patient has been seeing Durwin Nora for dietary, calcium  therapy.  She is using Activa yogurt and is on a wheat free diet.  She  reports her symptoms of irritable bowel syndrome are markedly improved.  Plan:  Patient to continue on her lifestyle and diet management which is  having such positive effects for her.   In summary this is a women in excellent health.  She has asked to return  to see me on a p.r.n. basis or in one year.     Rosalyn Gess Norins, MD  Electronically Signed    MEN/MedQ  DD: 07/02/2006  DT: 07/02/2006  Job #: [id number]   cc:   Gaetano Hawthorne. Lily Peer, M.D.  [name]

## 2010-11-03 NOTE — Assessment & Plan Note (Signed)
[facility]                            CARDIOLOGY OFFICE NOTE   NAME:MCCULLEYShirelle, [name]                   MRN:          [id number]  DATE:08/06/2006                            DOB:          1951/04/13    [name] returns today for further management of her mild aortic  insufficiency.  She has been having some atypical chest pain described  as aching in the center of her chest at the end of the day.  She is  still working out daily without any chest discomfort.  She has had a  negative stress nuclear study in the past.  She had a recent  echocardiogram in January of this year, which showed mild aortic  insufficiency, which is unchanged.  She has mild aortic valve thickness.  Otherwise, everything was normal.   She is under a lot of stress with her mother-in-law in the hospital.   MEDICATIONS:  She is on no cardiac medications.   PHYSICAL EXAMINATION:  VITAL SIGNS:  Her blood pressure today is 106/78,  pulse is 58.  She is in sinus bradycardia.  Her EKG is normal, except  for a mild rightward axis.  There has been no significant change.  HEENT:  Normocephalic and atraumatic.  Pupils equal, round and reactive  to light and accommodation.  Extraocular movements intact.  Sclerae are  clear.  Facial symmetry is normal.  Dentition is satisfactory.  NECK:  Supple.  Carotid upstrokes are equal bilaterally without bruits.  There is no JVD.  Thyroid is not enlarged.  Trachea is midline.  LUNGS:  Clear to auscultation and percussion.  HEART:  Regular rate and rhythm.  She has a very faint diastolic blow on  the left sternal border.  There is no gallop.  ABDOMEN:  Soft with good bowel sounds.  No midline bruit.  There is no  hepatomegaly.  EXTREMITIES:  No clubbing, cyanosis, or edema.  Pulses are intact.   ASSESSMENT AND PLAN:  [name] is doing well.  I have reassured her  that her chest discomfort is non-cardiac.  I will plan on seeing her  back  in a year.  We made no changes in her program.  She knows now that  she does not need SV prophylaxis for her mild aortic insufficiency.     Thomas C. Daleen Squibb, MD, [facility]  Electronically Signed    TCW/MedQ  DD: 08/06/2006  DT: 08/07/2006  Job #: [id number]

## 2010-11-03 NOTE — Assessment & Plan Note (Signed)
[facility]                         GASTROENTEROLOGY OFFICE NOTE   NAME:MCCULLEYShirlene, [name]                   MRN:          [id number]  DATE:08/28/2006                            DOB:          [birth year]    HISTORY:  [name] presents today with abdominal complaints.  She is a  60 year old with a history of constipation predominant irritable bowel  syndrome.  She was last evaluated Oct 18, 2004.  At that time she was  approved on Zelnorm.  At one point Zelnorm was taken off the market.  She had some recurrent symptoms.  She has been treating herself with  yogurt supplement and had been doing well until recent weeks when she  has had increased problems with constipation and bloating.  No nausea,  vomiting, bleeding, weight loss, fevers, or other issues.  She is  certain these are the same complaints she has had for years.   CURRENT MEDICATIONS:  Vitamin C, multivitamin, Premarin cream, Citrucel,  Vitamin D, and flax seed oil.  She also uses p.r.n. Prevacid.   PHYSICAL EXAMINATION:  Finds a well-appearing female in no acute  distress.  Blood pressure 108/56, heart rate 72, weight is 104.2 pounds.  HEENT:  Sclerae are anicteric, oral mucosa intact.  LUNGS:  Clear.  HEART:  Regular.  ABDOMEN:  Soft without tenderness, mass, or hernia.  Good bowel sounds  heard.   IMPRESSION:  Constipation predominant irritable bowel syndrome.   RECOMMENDATIONS:  1. Treat with probiotic Align 1 p.o. daily times two weeks.  2. MiraLax 17 grams in 8 ounces of water daily to improve bowel      habits.  This may be titrated to need.  3. Follow up p.r.n.     Wilhemina Bonito. [name] Goodell, MD  Electronically Signed    JNP/MedQ  DD: 08/28/2006  DT: 08/30/2006  Job #: [id number]   cc:   Rosalyn Gess. Norins, MD

## 2010-11-03 NOTE — Assessment & Plan Note (Signed)
[facility]                           PRIMARY CARE OFFICE NOTE   NAME:MCCULLEYArly, [name]                   MRN:          [id number]  DATE:05/17/2006                            DOB:          Jun 02, 1951    [name] is seen as a walk-in.  She was at her gym and slipped and  sustained an abrasion and ecchymosis to her left forearm just below the  elbow.   Vital signs were stable, temperature was 98.   Dermatologic:  The patient has a minimal abrasion, no real laceration,  with a surrounding bruise approximately 5 cm across which is just  beginning to darken up.  I reassured the patient that there was no  significant injury, that no additional treatment was needed.  I would  recommend that she use warm compresses only to this.  She does not  require tetanus for this.   The patient is advised to watch for any signs of progressive problem  such as redness, streaking, pus or fever.     Rosalyn Gess Norins, MD  Electronically Signed    MEN/MedQ  DD: 05/19/2006  DT: 05/20/2006  Job #: [id number]

## 2010-11-14 ENCOUNTER — Telehealth: Payer: Self-pay | Admitting: *Deleted

## 2010-11-14 MED ORDER — LANSOPRAZOLE 30 MG PO CPDR: 30.0000 mg | DELAYED_RELEASE_CAPSULE | Freq: Every day | ORAL | Status: DC

## 2010-11-14 MED ORDER — BENZONATATE 100 MG PO CAPS: 100.0000 mg | ORAL_CAPSULE | Freq: Four times a day (QID) | ORAL | Status: AC | PRN

## 2010-11-14 NOTE — Telephone Encounter (Signed)
Ok for benzonatate # 30 with 2 refills. Ok for prevacid 30mg  1 po qAM, #30 refill prn

## 2010-11-14 NOTE — Telephone Encounter (Signed)
Patient requesting RF of benzonate and RX for prevacid 30 mg 1 qd. She has tried zegerid and omeprazole but would like to resume prevacid rx as that has helped the most in the past. OK?

## 2010-11-14 NOTE — Telephone Encounter (Signed)
Done, Patient informed. She c/o hoarsness, sinus drainage and cough. No fever, or discolored drainage. Advised to call back for OV if symptoms increase.

## 2010-11-15 ENCOUNTER — Ambulatory Visit (INDEPENDENT_AMBULATORY_CARE_PROVIDER_SITE_OTHER): Payer: BC Managed Care – PPO | Admitting: Internal Medicine

## 2010-11-15 ENCOUNTER — Encounter: Payer: Self-pay | Admitting: Internal Medicine

## 2010-11-15 DIAGNOSIS — K219 Gastro-esophageal reflux disease without esophagitis: Secondary | ICD-10-CM

## 2010-11-15 DIAGNOSIS — J309 Allergic rhinitis, unspecified: Secondary | ICD-10-CM

## 2010-11-15 MED ORDER — AZITHROMYCIN 500 MG PO TABS: 500.0000 mg | ORAL_TABLET | Freq: Every day | ORAL | Status: AC

## 2010-11-16 NOTE — Progress Notes (Signed)
  Subjective:    Patient ID: [name], female    DOB: 09-05-1950, 60 y.o.   MRN: [id number]  HPI Mrs. [name] presents for increased problems with discomfort she associates with GERD. She has not been taking her PPI on a regular basis. She does report that when she restarted Zegrid it did not work as well as it use to. She has done better with prevacid 30mg .  Additionally, she has had URI symptoms with a cough that has become productive. She has had no fever, no SOB/DOE, no facial pain.   I have reviewed the patient's medical history in detail and updated the computerized patient record.    Review of Systems Review of Systems  Constitutional:  Negative for fever, chills, activity change and unexpected weight change.  HENT:  Negative for hearing loss, ear pain, neck stiffness. Positive for postnasal drip.   Eyes: Negative for pain, discharge and visual disturbance.  Respiratory: Negative for chest tightness and wheezing.   Cardiovascular: Negative for chest pain and palpitations.       [No decreased exercise tolerance Gastrointestinal: [No change in bowel habit. No bloating or gas. No reflux or indigestion Genitourinary: Negative for urgency, frequency, flank pain and difficulty urinating.  Musculoskeletal: Negative for myalgias, back pain, arthralgias and gait problem.  Neurological: Negative for dizziness, tremors, weakness and headaches.  Hematological: Negative for adenopathy.  Psychiatric/Behavioral: Negative for behavioral problems and dysphoric mood.       Objective:   Physical Exam Vitals reviewed Gen'l - WNWD white woman in no distress HEENT- no facial tenderness to percussion, TM's normal, Throat with mild cobblestone appearance, no excudate Lungs - CTAP Cor- RRR        Assessment & Plan:

## 2010-11-16 NOTE — Assessment & Plan Note (Signed)
Patient with symptoms of allergic rhinnitis with post-nasal drainage. Also having productive cough suggestive of secondary infection.  Plan - continue non-sedating antihistamine           Tri-Pak as directed.

## 2010-11-16 NOTE — Assessment & Plan Note (Signed)
Recurrent GERD described as hot water brash and discomfort.  Plan - resume PPI therapy - PA for prevacid pending. If denied will go with formulary choice, i.e. nexium.

## 2010-11-21 ENCOUNTER — Telehealth: Payer: Self-pay | Admitting: *Deleted

## 2010-11-21 NOTE — Telephone Encounter (Signed)
Pt has been taking meds as prescribed by MD at last OV. She continues to c/o cough. She has "coughing episodes" that "takes her breath away". She wants to know if MD any any further suggestions.

## 2010-11-21 NOTE — Telephone Encounter (Signed)
For cough - tessalon perles 100mg  q 8, # 30 1 refill and phenergan with codeine 1 tsp q 6 8 oz 1 refill.

## 2010-11-22 NOTE — Telephone Encounter (Signed)
Spoke with pt she stated that she saw Dr Corinda Gubler for allergy shot and he prescribed methylprednisolone.

## 2010-11-23 ENCOUNTER — Encounter: Payer: Self-pay | Admitting: Internal Medicine

## 2010-11-24 ENCOUNTER — Encounter: Payer: Self-pay | Admitting: Internal Medicine

## 2010-11-24 ENCOUNTER — Ambulatory Visit (INDEPENDENT_AMBULATORY_CARE_PROVIDER_SITE_OTHER): Payer: BC Managed Care – PPO | Admitting: Internal Medicine

## 2010-11-24 ENCOUNTER — Ambulatory Visit: Payer: BC Managed Care – PPO | Admitting: Internal Medicine

## 2010-11-24 DIAGNOSIS — R0683 Snoring: Secondary | ICD-10-CM

## 2010-11-24 DIAGNOSIS — J385 Laryngeal spasm: Secondary | ICD-10-CM

## 2010-11-24 DIAGNOSIS — R0609 Other forms of dyspnea: Secondary | ICD-10-CM

## 2010-11-24 DIAGNOSIS — R0989 Other specified symptoms and signs involving the circulatory and respiratory systems: Secondary | ICD-10-CM

## 2010-11-26 ENCOUNTER — Encounter: Payer: Self-pay | Admitting: Internal Medicine

## 2010-11-26 NOTE — Progress Notes (Signed)
Subjective:    Patient ID: [name], female    DOB: [birth year], 60 y.o.   MRN: [id number]  HPI [name] presents for problem with breathing - she will have a sensation of her throat closing and then will not be able to get a deep breath. She has been evaluated for this type of problem in the past. One potential cause is reflux triggered laryngospasm for which she is now taking a PPI daily. There  Is a question of possible allergy and she is followed at [facility]. She has not had any limitation in her activities, she has not had syncope. Her husband, present today, has noted she will snore loudly when laying on her back, but he does not report any apneic episodes.  Past Medical History  Diagnosis Date  . Aortic valve disorders   . Mitral valve insufficiency and aortic valve insufficiency   . Other acute reactions to stress   . Internal hemorrhoids without mention of complication   . Unspecified constipation   . Irritable bowel syndrome   . Hemorrhage of rectum and anus   . Microscopic hematuria   . Personal history of urinary calculi   . Other plastic surgery for unacceptable cosmetic appearance     Inj tx fllers/expander  . Esophageal reflux   . Postmenopausal atrophic vaginitis   . Backache, unspecified   . Allergic rhinitis, cause unspecified   . Conversion disorder   . Calculus of kidney    Past Surgical History  Procedure Date  . Cosmetic procedures w/injection therapy   . Wisdom tooth extraction    Family History  Problem Relation Age of Onset  . Hyperlipidemia Father   . COPD Father     Smoker  . Hyperlipidemia Mother   . Diabetes Neg Hx   . Colon cancer Neg Hx   . Breast cancer Neg Hx   . Coronary artery disease Neg Hx   . Cancer Neg Hx     breast or colon   History   Social History  . Marital Status: Married    Spouse Name: N/A    Number of Children: 0  . Years of Education: N/A   Occupational History  . [employer]   .  [employer]    Social History Main Topics  . Smoking status: Never Smoker   . Smokeless tobacco: Not on file  . Alcohol Use: No  . Drug Use: No  . Sexually Active: Not on file   Other Topics Concern  . Not on file   Social History Narrative   [employer] in [place]. Married '90 - marriage is in very good shape '12, No children.  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 [facility] medical services for them if needed (Nov '11)       Review of Systems Review of Systems  Constitutional:  Negative for fever, chills, activity change and unexpected weight change.  HEENT:  Negative for hearing loss, ear pain, congestion, neck stiffness and postnasal drip. Negative for sore throat or swallowing problems. Negative for dental complaints.   Eyes: Negative for vision loss or change in visual acuity.  Respiratory: Negative for chest tightness and wheezing.   Cardiovascular: Negative for chest pain and palpitationNo decreased exercise tolerance Gastrointestinal: No change in bowel habit. No bloating or gas. No reflux or indigestion Genitourinary: Negative for urgency, frequency, flank pain and difficulty urinating.  Musculoskeletal: Negative for myalgias, back pain, arthralgias and gait problem.  Neurological: Negative for dizziness, tremors, weakness and headaches.  Hematological: Negative for adenopathy.  Psychiatric/Behavioral: Negative for behavioral problems and dysphoric mood.       Objective:   Physical Exam Vital signs reviewed Gen'l - atheletic appearing white woman who looks younger than her stated age. Pul - normal respirations at this exam, no rales no wheezing Cor - RRR       Assessment & Plan:  1. Shortness of breath - normal exam. She does have a history of globus hystericus. Her symptoms now are bothersome but she has no limitation in activities and has not had a syncopal event.    Plan - continue PPI on a daily basis.           Consult with ENT, Dr. Lazarus Salines, to r/o anatomic etiology for "laryngospasm" or snoring           Symptom management - trial of rapid shallow breathing when symptoms occur.

## 2010-12-04 ENCOUNTER — Telehealth: Payer: Self-pay | Admitting: *Deleted

## 2010-12-04 MED ORDER — AZITHROMYCIN 500 MG PO TABS: 500.0000 mg | ORAL_TABLET | Freq: Every day | ORAL | Status: AC

## 2010-12-04 NOTE — Telephone Encounter (Signed)
Pt states that she is still coughing up some green mucus & that she was given a ZPack for this on 11/15/10.  She also states that she was given a methylprednisolone injection by Dr Carney Bern, but that you had discussed this w/her & decided that she should not have another injection.  Pt states that she is taking Allegra, Zantac, & Prevacid. Pt would like to know if she can have a Rx for ABX sent in to pharmacy or does she need OV.?

## 2010-12-04 NOTE — Telephone Encounter (Signed)
Called pt. She is having recurrent symptoms. She got completely well when treated May 30 th with a tripak.  Plan - repeat dosing of tripak

## 2010-12-05 ENCOUNTER — Other Ambulatory Visit: Payer: Self-pay | Admitting: *Deleted

## 2011-02-08 ENCOUNTER — Other Ambulatory Visit: Payer: Self-pay | Admitting: Internal Medicine

## 2011-03-01 ENCOUNTER — Telehealth: Payer: Self-pay | Admitting: *Deleted

## 2011-03-01 NOTE — Telephone Encounter (Signed)
Pt called re: needing a stronger version of motrin for placement of invisalign braces. RN s/w Pt re: Naproxen which is available over the counter. Pt verbalizes understanding, will call back if she has additional questions.

## 2011-03-03 ENCOUNTER — Other Ambulatory Visit: Payer: Self-pay | Admitting: Internal Medicine

## 2011-03-03 DIAGNOSIS — Z0000 Encounter for general adult medical examination without abnormal findings: Secondary | ICD-10-CM

## 2011-03-07 ENCOUNTER — Ambulatory Visit (INDEPENDENT_AMBULATORY_CARE_PROVIDER_SITE_OTHER): Payer: BC Managed Care – PPO | Admitting: Internal Medicine

## 2011-03-07 ENCOUNTER — Other Ambulatory Visit: Payer: BC Managed Care – PPO

## 2011-03-07 ENCOUNTER — Encounter: Payer: Self-pay | Admitting: Internal Medicine

## 2011-03-07 VITALS — BP 110/72 | HR 63 | Temp 98.8°F | Ht 62.0 in | Wt 105.0 lb

## 2011-03-07 DIAGNOSIS — M25519 Pain in unspecified shoulder: Secondary | ICD-10-CM

## 2011-03-07 DIAGNOSIS — M542 Cervicalgia: Secondary | ICD-10-CM

## 2011-03-07 DIAGNOSIS — I359 Nonrheumatic aortic valve disorder, unspecified: Secondary | ICD-10-CM

## 2011-03-07 DIAGNOSIS — M25511 Pain in right shoulder: Secondary | ICD-10-CM | POA: Insufficient documentation

## 2011-03-07 DIAGNOSIS — L989 Disorder of the skin and subcutaneous tissue, unspecified: Secondary | ICD-10-CM

## 2011-03-07 DIAGNOSIS — G8929 Other chronic pain: Secondary | ICD-10-CM | POA: Insufficient documentation

## 2011-03-07 MED ORDER — NAPROXEN 500 MG PO TABS: 500.0000 mg | ORAL_TABLET | Freq: Two times a day (BID) | ORAL | Status: DC

## 2011-03-07 NOTE — Assessment & Plan Note (Signed)
Mild to mod, c/w simple strain/rot cuff tendonitis vs bursitis;  For naproxen prn short term,  to f/u any worsening symptoms or concerns, no need for films, consider ortho if not improved

## 2011-03-07 NOTE — Patient Instructions (Signed)
Take all new medications as prescribed - the naproxen for pain Continue all other medications as before, except hold on taking the ibuprofen when taking the naproxen Please call or return with any worsening symptoms You will be contacted regarding the referral for: echocardiogram Please keep your appointments with your specialists as you have planned - Dr Daleen Squibb

## 2011-03-07 NOTE — Assessment & Plan Note (Addendum)
Has occas palpiations, but o/w neg exam today,  Has appt with Dr Daleen Squibb, will ask for echo to be done prior to his evaluation (last 2010)

## 2011-03-07 NOTE — Assessment & Plan Note (Signed)
Small nodule right mid medial leg;  Appears benign,  to f/u any worsening symptoms or concerns
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
## 2011-03-10 ENCOUNTER — Encounter: Payer: Self-pay | Admitting: Internal Medicine

## 2011-03-13 ENCOUNTER — Other Ambulatory Visit (HOSPITAL_COMMUNITY): Payer: BC Managed Care – PPO

## 2011-03-14 ENCOUNTER — Encounter: Payer: BC Managed Care – PPO | Admitting: Internal Medicine

## 2011-03-20 ENCOUNTER — Encounter: Payer: Self-pay | Admitting: *Deleted

## 2011-03-22 ENCOUNTER — Ambulatory Visit: Payer: BC Managed Care – PPO

## 2011-03-23 ENCOUNTER — Ambulatory Visit: Payer: BC Managed Care – PPO | Admitting: Cardiology

## 2011-03-26 ENCOUNTER — Telehealth: Payer: Self-pay | Admitting: Cardiology

## 2011-03-26 NOTE — Telephone Encounter (Signed)
pt just wanted to verify that dr wall wanted her to have the echo tomorrow since he didn't order it but dr Oliver Barre did, doesn't mind doing it just wanted to ok it with dr wall, last one 04-03-2010  pls call [phone]

## 2011-03-26 NOTE — Telephone Encounter (Signed)
Pt will have ECHO as Dr. Jonny Ruiz ordered for Tuesday.  It has been 2 years since her last ECHO. Mylo Red RN

## 2011-03-27 ENCOUNTER — Ambulatory Visit (HOSPITAL_COMMUNITY): Payer: BC Managed Care – PPO | Attending: Internal Medicine | Admitting: Radiology

## 2011-03-27 DIAGNOSIS — I359 Nonrheumatic aortic valve disorder, unspecified: Secondary | ICD-10-CM | POA: Insufficient documentation

## 2011-03-29 ENCOUNTER — Ambulatory Visit (INDEPENDENT_AMBULATORY_CARE_PROVIDER_SITE_OTHER): Payer: BC Managed Care – PPO | Admitting: *Deleted

## 2011-03-29 DIAGNOSIS — Z23 Encounter for immunization: Secondary | ICD-10-CM

## 2011-04-13 ENCOUNTER — Ambulatory Visit: Payer: BC Managed Care – PPO | Admitting: Cardiology

## 2011-05-01 ENCOUNTER — Encounter: Payer: Self-pay | Admitting: Cardiology

## 2011-05-02 ENCOUNTER — Encounter: Payer: Self-pay | Admitting: Cardiology

## 2011-05-02 ENCOUNTER — Encounter: Payer: BC Managed Care – PPO | Admitting: Internal Medicine

## 2011-05-15 ENCOUNTER — Ambulatory Visit (INDEPENDENT_AMBULATORY_CARE_PROVIDER_SITE_OTHER): Payer: BC Managed Care – PPO | Admitting: Cardiology

## 2011-05-15 ENCOUNTER — Encounter: Payer: Self-pay | Admitting: Cardiology

## 2011-05-15 VITALS — BP 112/70 | HR 63 | Ht 62.0 in | Wt 101.0 lb

## 2011-05-15 DIAGNOSIS — I340 Nonrheumatic mitral (valve) insufficiency: Secondary | ICD-10-CM

## 2011-05-15 DIAGNOSIS — I359 Nonrheumatic aortic valve disorder, unspecified: Secondary | ICD-10-CM

## 2011-05-15 DIAGNOSIS — I059 Rheumatic mitral valve disease, unspecified: Secondary | ICD-10-CM

## 2011-05-15 NOTE — Progress Notes (Signed)
HPI [name]  comes in today Further evaluation and management for mild aortic insufficiency and mitral regurgitation.  Her echocardiogram ordered by primary care on October 9 showed no change. LV function is normal with an ejection fraction of 60% and normal chamber size.  He is totally asymptomatic with no chest pain or palpitations. She is running 4 miles a day. She is in excellent physical shape.    Past Medical History  Diagnosis Date  . Aortic valve disorders   . Mitral valve insufficiency and aortic valve insufficiency   . Other acute reactions to stress   . Internal hemorrhoids without mention of complication   . Unspecified constipation   . Irritable bowel syndrome   . Hemorrhage of rectum and anus   . Microscopic hematuria   . Personal history of urinary calculi   . Other plastic surgery for unacceptable cosmetic appearance     Inj tx fllers/expander  . Esophageal reflux   . Postmenopausal atrophic vaginitis   . Backache, unspecified   . Allergic rhinitis, cause unspecified   . Conversion disorder   . Calculus of kidney     Current Outpatient Prescriptions  Medication Sig Dispense Refill  . ALPRAZolam (XANAX) 0.5 MG tablet Take 1 tablet (0.5 mg total) by mouth every 6 (six) hours as needed for anxiety.  90 tablet  1  . benzonatate (TESSALON PERLES) 100 MG capsule Take 1 capsule (100 mg total) by mouth every 6 (six) hours as needed for cough.  30 capsule  2  . Botulinum Toxin Type A, Cosm, (BOTOX COSMETIC) 50 UNITS SOLR Inject into the muscle. 1 yearly injection       . ibuprofen (ADVIL,MOTRIN) 200 MG tablet Take 200 mg by mouth as needed.        . lansoprazole (PREVACID) 30 MG capsule Take 1 capsule (30 mg total) by mouth daily.  90 capsule  3  . naproxen (NAPROSYN) 500 MG tablet Take 1 tablet (500 mg total) by mouth 2 (two) times daily with a meal.  60 tablet  2  . PREMARIN vaginal cream TAKE AS DIRECTED  127 g  1    Allergies  Allergen Reactions  . Codeine    . Erythromycin   . Penicillins   . Sulfonamide Derivatives     Family History  Problem Relation Age of Onset  . Hyperlipidemia Father   . COPD Father     Smoker  . Hyperlipidemia Mother   . Diabetes Neg Hx   . Colon cancer Neg Hx   . Breast cancer Neg Hx   . Coronary artery disease Neg Hx   . Cancer Neg Hx     breast or colon    History   Social History  . Marital Status: Married    Spouse Name: N/A    Number of Children: 0  . Years of Education: N/A   Occupational History  . [employer]   . [employer]    Social History Main Topics  . Smoking status: Never Smoker   . Smokeless tobacco: Not on file  . Alcohol Use: No  . Drug Use: No  . Sexually Active: Not on file   Other Topics Concern  . Not on file   Social History Narrative   [employer] in [place]. Married '90 - marriage is in very good shape '12, No children.  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 [facility] medical services for them if needed (Nov '11)    ROS ALL NEGATIVE EXCEPT THOSE NOTED IN HPI  PE  General Appearance: well developed, well nourished in no acute distress HEENT: symmetrical face, PERRLA, good dentition  Neck: no JVD, thyromegaly, or adenopathy, trachea midline Chest: symmetric without deformity Cardiac: PMI non-displaced, RRR, normal S1, S2, no gallop , 1/6 aortic insufficiency murmur, change Lung: clear to ausculation and percussion Vascular: all pulses full without bruits  Abdominal: nondistended, nontender, good bowel sounds, no HSM, no bruits Extremities: no cyanosis, clubbing or edema, no sign of DVT, no varicosities  Skin: normal color, no rashes Neuro: alert and oriented x 3, non-focal Pysch: normal affect  EKG  Normal sinus rhythm, rightward axis, no changes.  BMET    Component Value Date/Time   NA 142 12/07/2009 0814   K 5.2* 12/07/2009 0814   CL 107 12/07/2009 0814    CO2 31 12/07/2009 0814   GLUCOSE 84 12/07/2009 0814   GLUCOSE 91 06/25/2006 0741   BUN 20 12/07/2009 0814   CREATININE 1.0 12/07/2009 0814   CALCIUM 9.5 12/07/2009 0814   GFRNONAA 64.42 12/07/2009 0814   GFRAA 84 10/28/2007 0809    Lipid Panel     Component Value Date/Time   CHOL 195 12/07/2009 0814   TRIG 50.0 12/07/2009 0814   HDL 81.50 12/07/2009 0814   CHOLHDL 2 12/07/2009 0814   VLDL 10.0 12/07/2009 0814   LDLCALC 104* 12/07/2009 0814    CBC    Component Value Date/Time   WBC 3.3* 12/07/2009 0814   RBC 3.95 12/07/2009 0814   HGB 12.8 12/07/2009 0814   HCT 36.8 12/07/2009 0814   PLT 178.0 12/07/2009 0814   MCV 93.2 12/07/2009 0814   MCHC 34.8 12/07/2009 0814   RDW 13.1 12/07/2009 0814   LYMPHSABS 1.4 12/07/2009 0814   MONOABS 0.3 12/07/2009 0814   EOSABS 0.2 12/07/2009 0814   BASOSABS 0.0 12/07/2009 1610

## 2011-05-15 NOTE — Assessment & Plan Note (Signed)
No change 

## 2011-05-15 NOTE — Patient Instructions (Signed)
Your physician wants you to follow-up in: 1 year or as needed with Dr. Daleen Squibb. You will receive a reminder letter in the mail two months in advance. If you don't receive a letter, please call our office to schedule the follow-up appointment.

## 2011-05-15 NOTE — Assessment & Plan Note (Signed)
Stable. Followup p.r.n. Echocardiogram only if exam changes or in 2 years.

## 2011-06-14 ENCOUNTER — Telehealth: Payer: Self-pay | Admitting: *Deleted

## 2011-06-14 NOTE — Telephone Encounter (Signed)
Pt has been taking the generic of prevacid and she states every time she takes it she gets a headache. Pt states she went off medication and did not experience headaches. She would like to know if there is any other alternative. Please advise

## 2011-06-15 MED ORDER — PANTOPRAZOLE SODIUM 40 MG PO TBEC: 40.0000 mg | DELAYED_RELEASE_TABLET | Freq: Every day | ORAL | Status: DC

## 2011-06-15 NOTE — Telephone Encounter (Signed)
A generic alternative will be pantoprazole (protonix) 40 mg once a day. May send in Rx for #30. Refill prn

## 2011-08-16 ENCOUNTER — Ambulatory Visit (INDEPENDENT_AMBULATORY_CARE_PROVIDER_SITE_OTHER): Payer: BC Managed Care – PPO | Admitting: Internal Medicine

## 2011-08-16 ENCOUNTER — Encounter: Payer: Self-pay | Admitting: Internal Medicine

## 2011-08-16 ENCOUNTER — Ambulatory Visit: Payer: BC Managed Care – PPO | Admitting: Internal Medicine

## 2011-08-16 VITALS — BP 98/68 | HR 68 | Temp 98.4°F | Resp 14 | Wt 102.5 lb

## 2011-08-16 DIAGNOSIS — H6980 Other specified disorders of Eustachian tube, unspecified ear: Secondary | ICD-10-CM

## 2011-08-16 DIAGNOSIS — H6990 Unspecified Eustachian tube disorder, unspecified ear: Secondary | ICD-10-CM

## 2011-08-18 NOTE — Progress Notes (Signed)
  Subjective:    Patient ID: [name], female    DOB: 03-21-1951, 61 y.o.   MRN: [id number]  HPI [name] presents with persistent pressure in her ears. She has seen Dr. Pollyann Kennedy and has no active ENT problem: normal audiology exam, normal ear structures. He diagnosed mild tinnitus. She has a pressure like feeling as well as a sense of hearing in her head - the silence is bothersome. NO fever or chills, no sinus pressure or drainage, no hearing loss.  PMH, FamHx and SocHx reviewed for any changes and relevance.    Review of Systems System review is negative for any constitutional, cardiac, pulmonary, GI or neuro symptoms or complaints other than as described in the HPI.     Objective:   Physical Exam Filed Vitals:   08/16/11 1146  BP: 98/68  Pulse: 68  Temp: 98.4 F (36.9 C)  Resp: 14   Gen'l- WNWD white woman in no distress HEENT- normal Pulm- normal respirations Cor - RRR Neuro - A&O x 3, hearing is in tact. Normal gait and balance.       Assessment & Plan:  Eustachian tube dysfunction - symptoms are suggestive of blocked eustachian tube, most likely related to allergic rhinitis.  Plan - short trial of sudafed 30 mg two or three times a day - for 1 or 2 days. Also trial of Nasonex - although less likely to help.            If decongestant helps but she is intolerant due to palpitations may need to refer back to ENT -

## 2011-08-23 ENCOUNTER — Telehealth: Payer: Self-pay | Admitting: *Deleted

## 2011-08-23 MED ORDER — AZITHROMYCIN 250 MG PO TABS: ORAL_TABLET | ORAL | Status: AC

## 2011-08-23 NOTE — Telephone Encounter (Signed)
Pt reports that she has been using the Sudafed as instructed but still experiencing H/A & "scratchy" sore throat. Requesting Zpack or alternative to pharmacy. Please advise.

## 2011-08-23 NOTE — Telephone Encounter (Signed)
Called patient-rx sent to drugstore.

## 2011-09-05 ENCOUNTER — Other Ambulatory Visit (INDEPENDENT_AMBULATORY_CARE_PROVIDER_SITE_OTHER): Payer: BC Managed Care – PPO

## 2011-09-05 DIAGNOSIS — Z0000 Encounter for general adult medical examination without abnormal findings: Secondary | ICD-10-CM

## 2011-09-05 LAB — CBC WITH DIFFERENTIAL/PLATELET
Eosinophils Relative: 5.7 % — ABNORMAL HIGH (ref 0.0–5.0)
HCT: 36.4 % (ref 36.0–46.0)
Hemoglobin: 12.5 g/dL (ref 12.0–15.0)
Lymphs Abs: 1.3 10*3/uL (ref 0.7–4.0)
MCV: 93.3 fl (ref 78.0–100.0)
Monocytes Absolute: 0.3 10*3/uL (ref 0.1–1.0)
Monocytes Relative: 8.8 % (ref 3.0–12.0)
Neutro Abs: 1.5 10*3/uL (ref 1.4–7.7)
RDW: 12.9 % (ref 11.5–14.6)
WBC: 3.3 10*3/uL — ABNORMAL LOW (ref 4.5–10.5)

## 2011-09-05 LAB — URINALYSIS, ROUTINE W REFLEX MICROSCOPIC
Bilirubin Urine: NEGATIVE
Ketones, ur: NEGATIVE
Nitrite: NEGATIVE
Total Protein, Urine: NEGATIVE
Urine Glucose: NEGATIVE
pH: 6 (ref 5.0–8.0)

## 2011-09-05 LAB — COMPREHENSIVE METABOLIC PANEL
CO2: 28 mEq/L (ref 19–32)
Creatinine, Ser: 0.7 mg/dL (ref 0.4–1.2)
GFR: 94.17 mL/min (ref >60.00)
Glucose, Bld: 82 mg/dL (ref 70–99)
Total Bilirubin: 0.5 mg/dL (ref 0.3–1.2)

## 2011-09-05 LAB — LIPID PANEL
Cholesterol: 182 mg/dL (ref 0–200)
HDL: 80 mg/dL (ref >39.00)
Triglycerides: 43 mg/dL (ref 0.0–149.0)
VLDL: 8.6 mg/dL (ref 0.0–40.0)

## 2011-09-11 ENCOUNTER — Encounter: Payer: Self-pay | Admitting: Internal Medicine

## 2011-09-11 ENCOUNTER — Ambulatory Visit (INDEPENDENT_AMBULATORY_CARE_PROVIDER_SITE_OTHER): Payer: BC Managed Care – PPO | Admitting: Internal Medicine

## 2011-09-11 VITALS — BP 110/76 | HR 78 | Temp 98.1°F | Resp 14 | Ht 61.75 in | Wt 104.0 lb

## 2011-09-11 DIAGNOSIS — M21619 Bunion of unspecified foot: Secondary | ICD-10-CM

## 2011-09-11 DIAGNOSIS — Z0000 Encounter for general adult medical examination without abnormal findings: Secondary | ICD-10-CM

## 2011-09-11 DIAGNOSIS — K219 Gastro-esophageal reflux disease without esophagitis: Secondary | ICD-10-CM

## 2011-09-11 DIAGNOSIS — R259 Unspecified abnormal involuntary movements: Secondary | ICD-10-CM

## 2011-09-11 MED ORDER — RANITIDINE HCL 150 MG PO TABS: 150.0000 mg | ORAL_TABLET | Freq: Two times a day (BID) | ORAL | Status: DC

## 2011-09-11 NOTE — Assessment & Plan Note (Signed)
Bilateral bunions of moderate size with minimal valgus deformity. No limitation in activity  Plan - shoes with wide toe-box           Backless shoes - note provided           Recommended ortho consult if interested in surgical treatment: Dr. Lajoyce Corners or Dr. Lestine Box

## 2011-09-11 NOTE — Patient Instructions (Signed)
Very normal lab results - excellent in all regards. Normal physical exam but with a soft, small mobile cyst at the 0200 position left breast.  For tenderness in the area of the stomach: take Zantac 150 mg AM and bedtime for 1 month then just at bedtime.  For consultation on bunions: either Dr. Leonides Grills at [facility] or Dr. Aldean Baker at [facility]. You need to wear a shoe with a wide toebox. See note for backless shoes.  Full note to follow.

## 2011-09-11 NOTE — Progress Notes (Signed)
Subjective:    Patient ID: [name], female    DOB: 06-12-1951, 61 y.o.   MRN: [id number]  HPI Ms. Lozon presents for general medical exam. She is overall doing well. She is current with GI and Cardiology. Her primary complaint is progressive bilateral "Bunions" although she is not limited in her activities. She has had no major illness, surgery or injury. Her chart is reviewed including previous physical and intercurrent encounters.  Past Medical History  Diagnosis Date  . Aortic valve disorders   . Mitral valve insufficiency and aortic valve insufficiency   . Other acute reactions to stress   . Internal hemorrhoids without mention of complication   . Unspecified constipation   . Irritable bowel syndrome   . Hemorrhage of rectum and anus   . Microscopic hematuria   . Personal history of urinary calculi   . Other plastic surgery for unacceptable cosmetic appearance     Inj tx fllers/expander  . Esophageal reflux   . Postmenopausal atrophic vaginitis   . Backache, unspecified   . Allergic rhinitis, cause unspecified   . Conversion disorder   . Calculus of kidney    Past Surgical History  Procedure Date  . Cosmetic procedures w/injection therapy   . Wisdom tooth extraction    Family History  Problem Relation Age of Onset  . Hyperlipidemia Father   . COPD Father     Smoker  . Hyperlipidemia Mother   . Diabetes Neg Hx   . Colon cancer Neg Hx   . Breast cancer Neg Hx   . Coronary artery disease Neg Hx   . Cancer Neg Hx     breast or colon   History   Social History  . Marital Status: Married    Spouse Name: N/A    Number of Children: 0  . Years of Education: N/A   Occupational History  . [employer]   . [employer]    Social History Main Topics  . Smoking status: Never Smoker   . Smokeless tobacco: Not on file  . Alcohol Use: No  . Drug Use: No  . Sexually Active: Not on file   Other Topics Concern  . Not on file   Social History  Narrative   [employer] in [place]. Married '90 - marriage is in very good shape '12, No children.  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 [facility] medical services for them if needed (Nov '11)       Review of Systems Constitutional:  Negative for fever, chills, activity change and unexpected weight change.  HEENT:  Negative for hearing loss, ear pain, congestion, neck stiffness and postnasal drip. Negative for sore throat or swallowing problems. Negative for dental complaints.   Eyes: Negative for vision loss or change in visual acuity.  Respiratory: Negative for chest tightness and wheezing. Negative for DOE.   Cardiovascular: Negative for chest pain or palpitations. No decreased exercise tolerance Gastrointestinal: No change in bowel habit. No bloating or gas. No reflux or indigestion Genitourinary: Negative for urgency, frequency, flank pain and difficulty urinating.  Musculoskeletal: Negative for myalgias, back pain, arthralgias and gait problem.  Neurological: Negative for dizziness, tremors, weakness and headaches.  Hematological: Negative for adenopathy.  Psychiatric/Behavioral: Negative for behavioral problems and dysphoric mood.       Objective:   Physical Exam Filed Vitals:   09/11/11 0911  BP: 110/76  Pulse: 78  Temp: 98.1 F (36.7 C)  Resp: 14   Wt Readings from Last 3 Encounters:  09/11/11 104 lb (47.174 kg)  08/16/11 102 lb 8 oz (46.494 kg)  05/15/11 101 lb (45.813 kg)   Gen'l: well nourished, well developed white woman in no distress HEENT - [place]/AT, EACs/TMs normal, oropharynx with native dentition in good condition, no buccal or palatal lesions, posterior pharynx clear, mucous membranes moist. C&S clear, PERRLA, fundi - normal Neck - supple, no thyromegaly Nodes- negative submental, cervical, supraclavicular, axillary regions Chest - no deformity, no CVAT Lungs - clear without  rales, wheezes. No increased work of breathing Breast - nipples without discharge, skin is normal. No fixed mass or lesion. There is tissue thickening at the 0200 position left breast,possibly cystic. Cardiovascular - regular rate and rhythm, quiet precordium, no murmurs, rubs or gallops, 2+ radial, DP and PT pulses Abdomen - BS+ x 4, no HSM, no guarding or rebound or tenderness Pelvic - deferred to normal exam June '11 Rectal - deferred to GI Extremities - no clubbing, cyanosis, edema. Bilateral bunion deformity at 1st MTP joint and at 5th MTP joint.  Neuro - A&O x 3, CN II-XII normal, motor strength normal and equal, DTRs 2+ and symmetrical biceps, radial, and patellar tendons. Cerebellar - no tremor, no rigidity, fluid movement and normal gait. Derm - Head, neck, back, abdomen and extremities without suspicious lesions  Lab Results  Component Value Date   WBC 3.3* 09/05/2011   HGB 12.5 09/05/2011   HCT 36.4 09/05/2011   PLT 154.0 09/05/2011   GLUCOSE 82 09/05/2011   CHOL 182 09/05/2011   TRIG 43.0 09/05/2011   HDL 80.00 09/05/2011   LDLCALC 93 09/05/2011   ALT 17 09/05/2011   AST 21 09/05/2011   NA 139 09/05/2011   K 3.8 09/05/2011   CL 106 09/05/2011   CREATININE 0.7 09/05/2011   BUN 21 09/05/2011   CO2 28 09/05/2011   TSH 1.61 09/05/2011          Assessment & Plan:

## 2011-09-11 NOTE — Assessment & Plan Note (Signed)
On exam abdomen was tender left of center at epigastrium  Plan - dyspepsia - will treat with H2 blocker bid x 1 month then qhs long term (PPI cause headache)

## 2011-09-11 NOTE — Assessment & Plan Note (Signed)
symtoms resolved.

## 2011-09-11 NOTE — Assessment & Plan Note (Signed)
Interval medical history - left shoulder pain which has improved, concerned about thinning hair but no major illness or injury. Physical exam, sans Pelvic, normal. Lab results are excellent with a very protective lipid profile. She is current with colorectal cancer screening and mammography. She is due Tdap.  In summary- a very nice woman who appears to be in good health despite a lengthy problem list. She is encouraged to continue her exercise regimen and healthy diet. She will decide about seeking orthopedic consultation about her bunions but in the meantime she is given medical reason for wearing backless shoes.  She will return as needed.

## 2011-09-21 ENCOUNTER — Ambulatory Visit (INDEPENDENT_AMBULATORY_CARE_PROVIDER_SITE_OTHER)
Admission: RE | Admit: 2011-09-21 | Discharge: 2011-09-21 | Disposition: A | Discharge status: Home / Self Care | Payer: BC Managed Care – PPO | Source: Ambulatory Visit | Attending: Internal Medicine | Admitting: Internal Medicine

## 2011-09-21 ENCOUNTER — Ambulatory Visit (INDEPENDENT_AMBULATORY_CARE_PROVIDER_SITE_OTHER): Payer: BC Managed Care – PPO | Admitting: Internal Medicine

## 2011-09-21 ENCOUNTER — Encounter: Payer: Self-pay | Admitting: Internal Medicine

## 2011-09-21 VITALS — BP 128/70 | HR 81 | Temp 98.1°F | Resp 16 | Wt 102.0 lb

## 2011-09-21 DIAGNOSIS — M546 Pain in thoracic spine: Secondary | ICD-10-CM

## 2011-09-21 DIAGNOSIS — R0789 Other chest pain: Secondary | ICD-10-CM

## 2011-09-21 DIAGNOSIS — M542 Cervicalgia: Secondary | ICD-10-CM

## 2011-09-21 DIAGNOSIS — G8929 Other chronic pain: Secondary | ICD-10-CM

## 2011-09-21 NOTE — Assessment & Plan Note (Signed)
Plain films today to check for spurring, occult fx., ddd

## 2011-09-21 NOTE — Patient Instructions (Signed)
Chest Pain (Nonspecific) It is often hard to give a specific diagnosis for the cause of chest pain. There is always a chance that your pain could be related to something serious, such as a heart attack or a blood clot in the lungs. You need to follow up with your caregiver for further evaluation. CAUSES   Heartburn.   Pneumonia or bronchitis.   Anxiety or stress.   Inflammation around your heart (pericarditis) or lung (pleuritis or pleurisy).   A blood clot in the lung.   A collapsed lung (pneumothorax). It can develop suddenly on its own (spontaneous pneumothorax) or from injury (trauma) to the chest.   Shingles infection (herpes zoster virus).  The chest wall is composed of bones, muscles, and cartilage. Any of these can be the source of the pain.  The bones can be bruised by injury.   The muscles or cartilage can be strained by coughing or overwork.   The cartilage can be affected by inflammation and become sore (costochondritis).  DIAGNOSIS  Lab tests or other studies, such as X-rays, electrocardiography, stress testing, or cardiac imaging, may be needed to find the cause of your pain.  TREATMENT   Treatment depends on what may be causing your chest pain. Treatment may include:   Acid blockers for heartburn.   Anti-inflammatory medicine.   Pain medicine for inflammatory conditions.   Antibiotics if an infection is present.   You may be advised to change lifestyle habits. This includes stopping smoking and avoiding alcohol, caffeine, and chocolate.   You may be advised to keep your head raised (elevated) when sleeping. This reduces the chance of acid going backward from your stomach into your esophagus.   Most of the time, nonspecific chest pain will improve within 2 to 3 days with rest and mild pain medicine.  HOME CARE INSTRUCTIONS   If antibiotics were prescribed, take your antibiotics as directed. Finish them even if you start to feel better.   For the next few  days, avoid physical activities that bring on chest pain. Continue physical activities as directed.   Do not smoke.   Avoid drinking alcohol.   Only take over-the-counter or prescription medicine for pain, discomfort, or fever as directed by your caregiver.   Follow your caregiver's suggestions for further testing if your chest pain does not go away.   Keep any follow-up appointments you made. If you do not go to an appointment, you could develop lasting (chronic) problems with pain. If there is any problem keeping an appointment, you must call to reschedule.  SEEK MEDICAL CARE IF:   You think you are having problems from the medicine you are taking. Read your medicine instructions carefully.   Your chest pain does not go away, even after treatment.   You develop a rash with blisters on your chest.  SEEK IMMEDIATE MEDICAL CARE IF:   You have increased chest pain or pain that spreads to your arm, neck, jaw, back, or abdomen.   You develop shortness of breath, an increasing cough, or you are coughing up blood.   You have severe back or abdominal pain, feel nauseous, or vomit.   You develop severe weakness, fainting, or chills.   You have a fever.  THIS IS AN EMERGENCY. Do not wait to see if the pain will go away. Get medical help at once. Call your local emergency services (911 in [place] Do not drive yourself to the hospital. MAKE SURE YOU:   Understand these instructions.     Will watch your condition.   Will get help right away if you are not doing well or get worse.  Document Released: 03/14/2005 Document Revised: 05/24/2011 Document Reviewed: 01/08/2008 Exit[employer] Patient Information 2012 [employer]. 
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
## 2011-09-21 NOTE — Assessment & Plan Note (Signed)
Her EKG is normal, she has previously been followed by Dr. Daleen Squibb for valvular disease, her symptoms today are benign and her exam is normal (no murmur, etc. Heard). I will check her CXR to look for structural lesion and have given her pt ed material and have asked her to have a close f/up.

## 2011-09-21 NOTE — Assessment & Plan Note (Signed)
She has ibuprofen that she can take if needed, today I will check her plain films to see if she has an occult fx., ddd, spurring, etc.

## 2011-09-24 ENCOUNTER — Encounter: Payer: Self-pay | Admitting: Internal Medicine

## 2011-10-10 ENCOUNTER — Telehealth: Payer: Self-pay | Admitting: Cardiology

## 2011-10-10 NOTE — Telephone Encounter (Signed)
Discussed incident of heart racing along with pt history of aortic insufficiency with DOD/ Dr Jens Som in Dr Anola Gurney absence. Pt was told since her symptoms have resolved we will wait to do further testing at this time. Pt just saw PCP and had normal EKG last week. Otherwise she has no further complaints and is feeling well. Informed her that Dr Daleen Squibb will be updated of symptoms, pt agreeable to plan.

## 2011-10-10 NOTE — Telephone Encounter (Signed)
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  When she sat down her heart rate lowered to its normal range rapidly.  Patient wanted to know how to proceed form here.  Please call back.

## 2011-10-11 ENCOUNTER — Telehealth: Payer: Self-pay | Admitting: Cardiology

## 2011-10-11 ENCOUNTER — Ambulatory Visit (INDEPENDENT_AMBULATORY_CARE_PROVIDER_SITE_OTHER): Payer: BC Managed Care – PPO | Admitting: Physician Assistant

## 2011-10-11 ENCOUNTER — Encounter: Payer: Self-pay | Admitting: Physician Assistant

## 2011-10-11 VITALS — BP 130/58 | HR 68 | Resp 18 | Ht 62.0 in | Wt 104.0 lb

## 2011-10-11 DIAGNOSIS — R002 Palpitations: Secondary | ICD-10-CM

## 2011-10-11 NOTE — Progress Notes (Signed)
[address] [place], [place]  [address] Phone: [phone] Fax:  [phone]  Date:  10/11/2011   Name:  [name]       DOB:  1951/03/12 MRN:  [id number]  PCP:  [name] Regulus, MD, MD  Primary Cardiologist:  Dr. Valera Castle  Primary Electrophysiologist:  None    History of Present Illness: [name] is a 61 y.o. female who returns for evaluation of palpitations.  She has a history of mild aortic insufficiency and mild mitral regurgitation which has been fairly stable over the years as well as palpitations.  She called in recently with increased heart rate and palpitations and was added onto my schedule today.  Two days ago, she experienced a "flutter" while doing weight training.  She felt "funny" and noted her HR going up on her monitor to 190s.  Denies assoc near syncope, syncope, dyspnea, chest pain.  No recent exertional chest pain, DOE.  No orthopnea, PND, edema.  Very active and has had no exercise limitations of late.  Of note, she ate a lot of dark chocolate the day before.  She sometimes does this to help with her headaches.  Had some more "flutters" yesterday but feels ok today.   2D Echocardiogram 03/2011: Study Conclusions  - Left ventricle: The cavity size was normal. Wall thickness was normal. Systolic function was normal. The estimated ejection fraction was in the range of 55% to 60%. Wall motion was normal; there were no regional wall motion abnormalities. Left ventricular diastolic function parameters were normal. - Aortic valve: Mild regurgitation. - Mitral valve: Mild regurgitation.  TSH  Date/Time Value Range Status  09/05/2011  7:53 AM 1.61  0.35-5.50 (uIU/mL) Final     Potassium  Date/Time Value Range Status  09/05/2011  7:53 AM 3.8  3.5-5.1 (mEq/L) Final     Creatinine, Ser  Date/Time Value Range Status  09/05/2011  7:53 AM 0.7  0.4-1.2 (mg/dL) Final     Hemoglobin  Date/Time Value Range Status    09/05/2011  7:53 AM 12.5  12.0-15.0 (g/dL) Final      Past Medical History  Diagnosis Date  . Aortic valve disorders   . Mitral valve insufficiency and aortic valve insufficiency   . Other acute reactions to stress   . Internal hemorrhoids without mention of complication   . Unspecified constipation   . Irritable bowel syndrome   . Hemorrhage of rectum and anus   . Microscopic hematuria   . Personal history of urinary calculi   . Other plastic surgery for unacceptable cosmetic appearance     Inj tx fllers/expander  . Esophageal reflux   . Postmenopausal atrophic vaginitis   . Backache, unspecified   . Allergic rhinitis, cause unspecified   . Conversion disorder   . Calculus of kidney     Current Outpatient Prescriptions  Medication Sig Dispense Refill  . ALPRAZolam (XANAX) 0.5 MG tablet Take 1 tablet (0.5 mg total) by mouth every 6 (six) hours as needed for anxiety.  90 tablet  1  . benzonatate (TESSALON PERLES) 100 MG capsule Take 1 capsule (100 mg total) by mouth every 6 (six) hours as needed for cough.  30 capsule  2  . Botulinum Toxin Type A, Cosm, (BOTOX COSMETIC) 50 UNITS SOLR Inject into the muscle. 1 yearly injection       . ibuprofen (ADVIL,MOTRIN) 200 MG tablet Take 200 mg by mouth as needed.        . pantoprazole (PROTONIX) 40  MG tablet Take 1 tablet (40 mg total) by mouth daily.  30 tablet  1  . PREMARIN vaginal cream TAKE AS DIRECTED  127 g  1    Allergies: Allergies  Allergen Reactions  . Codeine   . Erythromycin   . Penicillins   . Sulfonamide Derivatives     History  Substance Use Topics  . Smoking status: Never Smoker   . Smokeless tobacco: Not on file  . Alcohol Use: No     ROS:  Please see the history of present illness.    All other systems reviewed and negative.   PHYSICAL EXAM: VS:  BP 130/58  Pulse 68  Resp 18  Ht 5\' 2"  (1.575 m)  Wt 104 lb (47.174 kg)  BMI 19.02 kg/m2 Well nourished, well developed, in no acute distress HEENT:  normal Neck: no JVD Endo: no thyromegaly Vasc: no carotid brutis Cardiac:  normal S1, S2; RRR; no murmur Lungs:  clear to auscultation bilaterally, no wheezing, rhonchi or rales Abd: soft, nontender, no hepatomegaly Ext: no edema Skin: warm and dry Neuro:  CNs 2-12 intact, no focal abnormalities noted Psych: normal affect  EKG:  Sinus rhythm, heart rate 68, normal axis, no ischemic changes  ASSESSMENT AND PLAN:  1.  Palpitations She recorded a HR up to 190s.  No dyspnea, near syncope.  May be related to increased caffeine intake.  This was quite concerning for her.  She had a recent echo with normal LVF and stable mild AI/MR.  Recent CPE done with normal TSH.  Arrange event monitor.  Follow up as scheduled or sooner if significant arrhythmia on monitor.   Signed, Tereso Newcomer, PA-C  4:13 PM 10/11/2011

## 2011-10-11 NOTE — Telephone Encounter (Signed)
Pt returns call today as she has experienced palpitations x2 last pm. "the flutters last only a few seconds but my heart rate was up to 194". At that time she was bending over picking up her shoes out of the closet. She wears a heart rate monitor for training purposes. She states she has also experienced the fluttering while lying down. Denies angina,nausea or lightheadedness. No episodes today. Pt is anxious about the palpitations. Offered pt appt with PA on 10/17/11 same day Dr. Daleen Squibb is here but pt was anxious. Reviewed with Lorin Picket PA. Pt will be seen today at 3:40pm Mylo Red RN

## 2011-10-11 NOTE — Patient Instructions (Signed)
Your physician recommends that you continue on your current medications as directed. Please refer to the Current Medication list given to you today.  Your physician has recommended that you wear an event monitor. Event monitors are medical devices that record the heart's electrical activity. Doctors most often [place] these monitors to diagnose arrhythmias. Arrhythmias are problems with the speed or rhythm of the heartbeat. The monitor is a small, portable device. You can wear one while you do your normal daily activities. This is usually used to diagnose what is causing palpitations/syncope (passing out).  Your physician recommends that you keep your scheduled follow-up appointment with Dr Daleen Squibb.

## 2011-10-11 NOTE — Telephone Encounter (Signed)
Pt had a episode yesterday where her heart rate went up to 190 while exercising she called the office and was told to monitor it and if it happens again to call the office. Last night she had two episodes where her heart was fluttering when she bent over and was lying down and she wants to talk to someone about it.

## 2011-10-12 ENCOUNTER — Encounter (INDEPENDENT_AMBULATORY_CARE_PROVIDER_SITE_OTHER): Payer: BC Managed Care – PPO

## 2011-10-12 DIAGNOSIS — R002 Palpitations: Secondary | ICD-10-CM

## 2011-10-14 ENCOUNTER — Other Ambulatory Visit: Payer: Self-pay | Admitting: Internal Medicine

## 2011-11-09 ENCOUNTER — Ambulatory Visit (INDEPENDENT_AMBULATORY_CARE_PROVIDER_SITE_OTHER): Payer: BC Managed Care – PPO | Admitting: Cardiology

## 2011-11-09 ENCOUNTER — Encounter: Payer: Self-pay | Admitting: Cardiology

## 2011-11-09 ENCOUNTER — Ambulatory Visit: Payer: BC Managed Care – PPO | Admitting: Cardiology

## 2011-11-09 VITALS — BP 122/74 | HR 63 | Ht 62.0 in | Wt 107.0 lb

## 2011-11-09 DIAGNOSIS — R002 Palpitations: Secondary | ICD-10-CM

## 2011-11-09 NOTE — Assessment & Plan Note (Signed)
These are most likely benign. It is possible she had a run of SVT exam but we have not caught it on an event recorder.  I told her to avoid excess caffeine including dark chocolate. If it happens again and is sustained I've instructed her to go for an EKG at the nearest urgent care.

## 2011-11-09 NOTE — Progress Notes (Signed)
HPI [name] comes in today because of palpitations and rapid heartbeat while she was lifting weights several weeks ago. She a lot of dark chocolate the night before.  It lasted for about 10 minutes. Her monitor in the gym rashes has 190. She's not had any syncope or presyncope. She denies chest pain.  She has occasional palpitations now but they're not sustained. She wore an event recorder for 3 weeks but did not have an event. She mailed it in today.  Past Medical History  Diagnosis Date  . Aortic valve disorders   . Mitral valve insufficiency and aortic valve insufficiency   . Other acute reactions to stress   . Internal hemorrhoids without mention of complication   . Unspecified constipation   . Irritable bowel syndrome   . Hemorrhage of rectum and anus   . Microscopic hematuria   . Personal history of urinary calculi   . Other plastic surgery for unacceptable cosmetic appearance     Inj tx fllers/expander  . Esophageal reflux   . Postmenopausal atrophic vaginitis   . Backache, unspecified   . Allergic rhinitis, cause unspecified   . Conversion disorder   . Calculus of kidney     Current Outpatient Prescriptions  Medication Sig Dispense Refill  . Botulinum Toxin Type A, Cosm, (BOTOX COSMETIC) 50 UNITS SOLR Inject into the muscle. 1 yearly injection       . ibuprofen (ADVIL,MOTRIN) 200 MG tablet Take 200 mg by mouth as needed.        . pantoprazole (PROTONIX) 40 MG tablet Take 1 tablet (40 mg total) by mouth daily.  30 tablet  1  . PREMARIN vaginal cream TAKE AS DIRECTED  127 g  1  . ALPRAZolam (XANAX) 0.5 MG tablet Take 1 tablet (0.5 mg total) by mouth every 6 (six) hours as needed for anxiety.  90 tablet  1  . benzonatate (TESSALON PERLES) 100 MG capsule Take 1 capsule (100 mg total) by mouth every 6 (six) hours as needed for cough.  30 capsule  2    Allergies  Allergen Reactions  . Codeine   . Erythromycin   . Penicillins   . Sulfonamide Derivatives     Family  History  Problem Relation Age of Onset  . Hyperlipidemia Father   . COPD Father     Smoker  . Hyperlipidemia Mother   . Diabetes Neg Hx   . Colon cancer Neg Hx   . Breast cancer Neg Hx   . Coronary artery disease Neg Hx   . Cancer Neg Hx     breast or colon    History   Social History  . Marital Status: Married    Spouse Name: N/A    Number of Children: 0  . Years of Education: N/A   Occupational History  . [employer]   . [employer]    Social History Main Topics  . Smoking status: Never Smoker   . Smokeless tobacco: Not on file  . Alcohol Use: No  . Drug Use: No  . Sexually Active: Not on file   Other Topics Concern  . Not on file   Social History Narrative   [employer] in [place]. Married '90 - marriage is in very good shape '12, No children.  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 Offerred medical services for them if needed (Nov '11)    ROS  ALL NEGATIVE EXCEPT THOSE NOTED IN HPI  PE  General Appearance: well developed, well nourished in no acute distress HEENT: symmetrical face, PERRLA, good dentition  Neck: no JVD, thyromegaly, or adenopathy, trachea midline Chest: symmetric without deformity Cardiac: PMI non-displaced, RRR, normal S1, S2, soft diastolic murmur left sternal border consistent with AI. Lung: clear to ausculation and percussion Vascular: all pulses full without bruits  Abdominal: nondistended, nontender, good bowel sounds, no HSM, no bruits Extremities: no cyanosis, clubbing or edema, no sign of DVT, no varicosities  Skin: normal color, no rashes Neuro: alert and oriented x 3, non-focal Pysch: normal affect  EKG  BMET    Component Value Date/Time   NA 139 09/05/2011 0753   K 3.8 09/05/2011 0753   CL 106 09/05/2011 0753   CO2 28 09/05/2011 0753   GLUCOSE 82 09/05/2011 0753   GLUCOSE 91 06/25/2006 0741   BUN 21 09/05/2011 0753   CREATININE 0.7  09/05/2011 0753   CALCIUM 9.2 09/05/2011 0753   GFRNONAA 64.42 12/07/2009 0814   GFRAA 84 10/28/2007 0809    Lipid Panel     Component Value Date/Time   CHOL 182 09/05/2011 0753   TRIG 43.0 09/05/2011 0753   HDL 80.00 09/05/2011 0753   CHOLHDL 2 09/05/2011 0753   VLDL 8.6 09/05/2011 0753   LDLCALC 93 09/05/2011 0753    CBC    Component Value Date/Time   WBC 3.3* 09/05/2011 0753   RBC 3.90 09/05/2011 0753   HGB 12.5 09/05/2011 0753   HCT 36.4 09/05/2011 0753   PLT 154.0 09/05/2011 0753   MCV 93.3 09/05/2011 0753   MCHC 34.2 09/05/2011 0753   RDW 12.9 09/05/2011 0753   LYMPHSABS 1.3 09/05/2011 0753   MONOABS 0.3 09/05/2011 0753   EOSABS 0.2 09/05/2011 0753   BASOSABS 0.0 09/05/2011 0753

## 2011-11-09 NOTE — Patient Instructions (Signed)
Your physician recommends that you continue on your current medications as directed. Please refer to the Current Medication list given to you today.     

## 2011-11-15 ENCOUNTER — Telehealth: Payer: Self-pay | Admitting: *Deleted

## 2011-11-15 ENCOUNTER — Telehealth: Payer: Self-pay

## 2011-11-15 NOTE — Telephone Encounter (Signed)
LMOVM  Pt personal voicemail that monitor report was reviewed by Dr. Daleen Squibb. Rhythm was normal. Mylo Red RN

## 2011-11-15 NOTE — Telephone Encounter (Signed)
Pt called requesting Rx for Diflucan to treat yeast infection - vaginal itch, discharge and burning.

## 2011-11-16 MED ORDER — FLUCONAZOLE 150 MG PO TABS: 150.0000 mg | ORAL_TABLET | Freq: Once | ORAL | Status: AC

## 2011-11-16 NOTE — Telephone Encounter (Signed)
Diflucan STAT 150 mg x 1. May repeat 1 day later if not improved. #2 tabs

## 2011-11-16 NOTE — Telephone Encounter (Signed)
Pt advised via VM of Rx/pharmacy 

## 2011-12-12 ENCOUNTER — Telehealth: Payer: Self-pay | Admitting: Cardiology

## 2011-12-12 NOTE — Telephone Encounter (Signed)
New Problem:    Patient called in wondering if she needed to be pre-medicaided prior to having her teeth cleaned.  Please call back.

## 2011-12-12 NOTE — Telephone Encounter (Signed)
LMOVM pt does not need antibiotic coverage prior to dental work. Mylo Red RN

## 2012-01-01 ENCOUNTER — Telehealth: Payer: Self-pay | Admitting: Cardiology

## 2012-01-01 NOTE — Telephone Encounter (Signed)
Pt calling re body sensor scale she has, info says not to use if has a device, which she doesn't but not concerned with her heart issues, is she ok to use it?

## 2012-01-02 NOTE — Telephone Encounter (Signed)
Reassured pt

## 2012-01-03 ENCOUNTER — Encounter: Payer: Self-pay | Admitting: Internal Medicine

## 2012-01-03 ENCOUNTER — Ambulatory Visit (INDEPENDENT_AMBULATORY_CARE_PROVIDER_SITE_OTHER): Payer: BC Managed Care – PPO | Admitting: Internal Medicine

## 2012-01-03 VITALS — BP 106/78 | HR 69 | Temp 98.6°F | Resp 16 | Wt 106.0 lb

## 2012-01-03 DIAGNOSIS — M542 Cervicalgia: Secondary | ICD-10-CM

## 2012-01-03 DIAGNOSIS — G8929 Other chronic pain: Secondary | ICD-10-CM

## 2012-01-03 NOTE — Patient Instructions (Addendum)
Neck and shoulder pain: on exam there is no evidence of a radiculopathy ( critically pinched nerve) although there is definite discomfort. The best next step is to return to Integrative therapy for reevaluation and perhaps more treatment. If you have progressive pain, loss of strength, loss of feeling or pins and needles will need to consider an MRI.   CERVICAL SPINE - COMPLETE 4+ VIEW  Comparison: 06/02/2010.  Findings: Stable cervical vertebral height alignment. Normal  prevertebral soft tissue contours. Stable cervicothoracic junction  alignment. Stable mild C4-C5 and C5-C6 disc space narrowing and  endplate spurring. Bilateral posterior element alignment is within  normal limits. Mild chronic osseous foraminal stenosis at the left  C5 nerve level. AP alignment and lung apices within normal limits.  C1-C2 alignment and odontoid within normal limits.  IMPRESSION:  No acute osseous abnormality in the cervical spine.   THORACIC SPINE - 2 VIEW + SWIMMERS  Comparison: 03/25/2007.  Findings: Normal thoracic segmentation. Bone mineralization is  within normal limits. Stable mild thoracic scoliosis since 2080.  Normal vertebral height and alignment on the lateral view. Chronic  mild endplate spurring has not significantly changed. Disc spaces  remain relatively preserved. Cervicothoracic junction alignment is  within normal limits. Stable thoracic visceral contours.  IMPRESSION:  No acute osseous abnormality in the thoracic spine.   It is ok to take up to 1800 mg daily of ibuprofen if needed. Heat and massage are also good treatments.   Call me if the symptoms get worse.
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
## 2012-01-07 NOTE — Assessment & Plan Note (Signed)
Neck discomfort/pain for greater than 4 weeks, since April '13. C-spine series April 5/'13: Findings: Stable cervical vertebral height alignment. Normal  prevertebral soft tissue contours. Stable cervicothoracic junction  alignment. Stable mild C4-C5 and C5-C6 disc space narrowing and  endplate spurring. Bilateral posterior element alignment is within  normal limits. Mild chronic osseous foraminal stenosis at the left  C5 nerve level. AP alignment and lung apices within normal limits.  C1-C2 alignment and odontoid within normal limits.  IMPRESSION:  No acute osseous abnormality in the cervical spine.  On exam - no radicular findings  Plan -  reconsult Integrative therapies (PT) for additional treatment for residual discomfort.

## 2012-01-16 ENCOUNTER — Ambulatory Visit: Payer: BC Managed Care – PPO | Admitting: Internal Medicine

## 2012-03-27 ENCOUNTER — Encounter: Payer: Self-pay | Admitting: Cardiology

## 2012-04-08 ENCOUNTER — Ambulatory Visit (INDEPENDENT_AMBULATORY_CARE_PROVIDER_SITE_OTHER): Payer: BC Managed Care – PPO

## 2012-04-08 DIAGNOSIS — Z23 Encounter for immunization: Secondary | ICD-10-CM

## 2012-04-15 ENCOUNTER — Ambulatory Visit: Payer: BC Managed Care – PPO

## 2012-04-17 ENCOUNTER — Ambulatory Visit: Payer: BC Managed Care – PPO

## 2012-05-29 ENCOUNTER — Ambulatory Visit (INDEPENDENT_AMBULATORY_CARE_PROVIDER_SITE_OTHER): Payer: BC Managed Care – PPO | Admitting: Cardiology

## 2012-05-29 ENCOUNTER — Encounter: Payer: Self-pay | Admitting: Cardiology

## 2012-05-29 VITALS — BP 114/76 | HR 76 | Ht 62.0 in | Wt 105.0 lb

## 2012-05-29 DIAGNOSIS — I059 Rheumatic mitral valve disease, unspecified: Secondary | ICD-10-CM

## 2012-05-29 DIAGNOSIS — I359 Nonrheumatic aortic valve disorder, unspecified: Secondary | ICD-10-CM

## 2012-05-29 DIAGNOSIS — I340 Nonrheumatic mitral (valve) insufficiency: Secondary | ICD-10-CM

## 2012-05-29 NOTE — Assessment & Plan Note (Signed)
Stable exam. Repeat echocardiogram next year.

## 2012-05-29 NOTE — Patient Instructions (Addendum)
Your physician recommends that you continue on your current medications as directed. Please refer to the Current Medication list given to you today.   Your physician wants you to follow-up in: 1 year with Dr. Wall. You will receive a reminder letter in the mail two months in advance. If you don't receive a letter, please call our office to schedule the follow-up appointment.  

## 2012-05-29 NOTE — Progress Notes (Signed)
HPI [name] returns today for evaluation and management of her history of palpitations, premature atrial beats, and mild aortic insufficiency.  She's truly in remarkable shape for her age. She is in training again for consultation. She notices with interval training that she has a funny feeling but is having a hard time describing it. Otherwise she's totally asymptomatic.  Her last echocardiogram was in November 2012 and showed normal left ventricular chamber size and function, EF 65%, mild mitral regurgitation mild aortic insufficiency.  Past Medical History  Diagnosis Date  . Aortic valve disorders   . Mitral valve insufficiency and aortic valve insufficiency   . Other acute reactions to stress   . Internal hemorrhoids without mention of complication   . Unspecified constipation   . Irritable bowel syndrome   . Hemorrhage of rectum and anus   . Microscopic hematuria   . Personal history of urinary calculi   . Other plastic surgery for unacceptable cosmetic appearance     Inj tx fllers/expander  . Esophageal reflux   . Postmenopausal atrophic vaginitis   . Backache, unspecified   . Allergic rhinitis, cause unspecified   . Conversion disorder   . Calculus of kidney     Current Outpatient Prescriptions  Medication Sig Dispense Refill  . ALPRAZolam (XANAX) 0.5 MG tablet Take 0.5 mg by mouth every 6 (six) hours as needed.      . Botulinum Toxin Type A, Cosm, (BOTOX COSMETIC) 50 UNITS SOLR Inject into the muscle. 1 yearly injection       . ibuprofen (ADVIL,MOTRIN) 200 MG tablet Take 200 mg by mouth as needed.        . pantoprazole (PROTONIX) 40 MG tablet Take 40 mg by mouth as needed.      [name] PREMARIN vaginal cream TAKE AS DIRECTED  127 g  1  . [DISCONTINUED] ALPRAZolam (XANAX) 0.5 MG tablet Take 1 tablet (0.5 mg total) by mouth every 6 (six) hours as needed for anxiety.  90 tablet  1  . [DISCONTINUED] pantoprazole (PROTONIX) 40 MG tablet Take 1 tablet (40 mg total) by mouth daily.   30 tablet  1    Allergies  Allergen Reactions  . Codeine   . Erythromycin   . Penicillins   . Sulfonamide Derivatives     Family History  Problem Relation Age of Onset  . Hyperlipidemia Father   . COPD Father     Smoker  . Hyperlipidemia Mother   . Diabetes Neg Hx   . Colon cancer Neg Hx   . Breast cancer Neg Hx   . Coronary artery disease Neg Hx   . Cancer Neg Hx     breast or colon    History   Social History  . Marital Status: Married    Spouse Name: N/A    Number of Children: 0  . Years of Education: N/A   Occupational History  . [employer]   . [employer]    Social History Main Topics  . Smoking status: Never Smoker   . Smokeless tobacco: Not on file  . Alcohol Use: No  . Drug Use: No  . Sexually Active: Not on file   Other Topics Concern  . Not on file   Social History Narrative   [employer] in [place]. Married '90 - marriage is in very good shape '12, No children.  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  Offerred medical services for them if needed (Nov '11)    ROS ALL NEGATIVE EXCEPT THOSE NOTED IN HPI  PE  General Appearance: well developed, well nourished in no acute distress HEENT: symmetrical face, PERRLA, good dentition  Neck: no JVD, thyromegaly, or adenopathy, trachea midline Chest: symmetric without deformity Cardiac: PMI non-displaced, RRR, normal S1, S2, no gallop, 2/6 diastolic murmur left upper sternal border Lung: clear to ausculation and percussion Vascular: all pulses full without bruits  Abdominal: nondistended, nontender, good bowel sounds, no HSM, no bruits Extremities: no cyanosis, clubbing or edema, no sign of DVT, no varicosities  Skin: normal color, no rashes Neuro: alert and oriented x 3, non-focal Pysch: normal affect  EKG  BMET    Component Value Date/Time   NA 139 09/05/2011 0753   K 3.8 09/05/2011 0753   CL 106  09/05/2011 0753   CO2 28 09/05/2011 0753   GLUCOSE 82 09/05/2011 0753   GLUCOSE 91 06/25/2006 0741   BUN 21 09/05/2011 0753   CREATININE 0.7 09/05/2011 0753   CALCIUM 9.2 09/05/2011 0753   GFRNONAA 64.42 12/07/2009 0814   GFRAA 84 10/28/2007 0809    Lipid Panel     Component Value Date/Time   CHOL 182 09/05/2011 0753   TRIG 43.0 09/05/2011 0753   HDL 80.00 09/05/2011 0753   CHOLHDL 2 09/05/2011 0753   VLDL 8.6 09/05/2011 0753   LDLCALC 93 09/05/2011 0753    CBC    Component Value Date/Time   WBC 3.3* 09/05/2011 0753   RBC 3.90 09/05/2011 0753   HGB 12.5 09/05/2011 0753   HCT 36.4 09/05/2011 0753   PLT 154.0 09/05/2011 0753   MCV 93.3 09/05/2011 0753   MCHC 34.2 09/05/2011 0753   RDW 12.9 09/05/2011 0753   LYMPHSABS 1.3 09/05/2011 0753   MONOABS 0.3 09/05/2011 0753   EOSABS 0.2 09/05/2011 0753   BASOSABS 0.0 09/05/2011 0753

## 2012-05-29 NOTE — Assessment & Plan Note (Signed)
Clinically stable. We'll repeat echocardiogram next year.

## 2012-06-03 ENCOUNTER — Ambulatory Visit (INDEPENDENT_AMBULATORY_CARE_PROVIDER_SITE_OTHER)
Admission: RE | Admit: 2012-06-03 | Discharge: 2012-06-03 | Disposition: A | Discharge status: Home / Self Care | Payer: BC Managed Care – PPO | Source: Ambulatory Visit | Attending: Internal Medicine | Admitting: Internal Medicine

## 2012-06-03 ENCOUNTER — Ambulatory Visit (INDEPENDENT_AMBULATORY_CARE_PROVIDER_SITE_OTHER): Payer: BC Managed Care – PPO | Admitting: Internal Medicine

## 2012-06-03 ENCOUNTER — Encounter: Payer: Self-pay | Admitting: Internal Medicine

## 2012-06-03 VITALS — BP 100/58 | Wt 106.0 lb

## 2012-06-03 DIAGNOSIS — S6990XA Unspecified injury of unspecified wrist, hand and finger(s), initial encounter: Secondary | ICD-10-CM

## 2012-06-03 DIAGNOSIS — S6991XA Unspecified injury of right wrist, hand and finger(s), initial encounter: Secondary | ICD-10-CM

## 2012-06-03 DIAGNOSIS — S6980XA Other specified injuries of unspecified wrist, hand and finger(s), initial encounter: Secondary | ICD-10-CM

## 2012-06-03 NOTE — Progress Notes (Signed)
  Subjective:    Patient ID: [name], female    DOB: 1950-07-06, 61 y.o.   MRN: [id number]  HPI Mrs. Depasquale is seen as an urgent walk - in after getting her right thumb caught in the car door. She has not had any laceration, no bleeding. The thumb is swollen and very painful. She is feel light-headed an slightly nauseate  C/w vaso-vagal response.  Hand x-ray: Findings: Comminuted, longitudinal fracture involving the distal  phalanx of the thumb is identified. The fracture fragments are in  anatomic alignment. There may be a fracture line extending into  the joint space. No dislocations. Soft tissue swelling is noted  involving the distal portion of the thumb.  IMPRESSION:  1. Comminuted fracture involves the distal phalanx of the thumb.  Cannot rule out intra-articular involvement.  PMH, FamHx and SocHx reviewed for any changes and relevance.  Current Outpatient Prescriptions on File Prior to Visit  Medication Sig Dispense Refill  . ALPRAZolam (XANAX) 0.5 MG tablet Take 0.5 mg by mouth every 6 (six) hours as needed.      . Botulinum Toxin Type A, Cosm, (BOTOX COSMETIC) 50 UNITS SOLR Inject into the muscle. 1 yearly injection       . ibuprofen (ADVIL,MOTRIN) 200 MG tablet Take 200 mg by mouth as needed.        . pantoprazole (PROTONIX) 40 MG tablet Take 40 mg by mouth as needed.      [name] PREMARIN vaginal cream TAKE AS DIRECTED  127 g  1        Review of Systems System review is negative for any constitutional, cardiac, pulmonary, GI or neuro symptoms or complaints other than as described in the HPI.     Objective:   Physical Exam Filed Vitals:   06/03/12 1109  BP: 100/58   Gen'l- WNWD woman looking younger than her stated age MSK - right thumb with small hematoma beneath the nail, moderated distal swelling, normal alignment, tender       Assessment & Plan:  Fracture thumb, distal phalanx - x ray reveals normal alignment of fragments. The fracture may extend to  the joint space  Plan Same day evaluation with Dr. Mina Marble at the [facility].

## 2012-06-05 ENCOUNTER — Encounter: Payer: Self-pay | Admitting: Internal Medicine

## 2012-06-05 ENCOUNTER — Ambulatory Visit (INDEPENDENT_AMBULATORY_CARE_PROVIDER_SITE_OTHER): Payer: BC Managed Care – PPO | Admitting: Internal Medicine

## 2012-06-05 VITALS — BP 110/72 | HR 84 | Temp 98.8°F | Resp 16 | Wt 105.0 lb

## 2012-06-05 DIAGNOSIS — S6000XA Contusion of unspecified finger without damage to nail, initial encounter: Secondary | ICD-10-CM

## 2012-06-05 DIAGNOSIS — S6010XA Contusion of unspecified finger with damage to nail, initial encounter: Secondary | ICD-10-CM

## 2012-06-09 NOTE — Progress Notes (Signed)
  Subjective:    Patient ID: [name], female    DOB: 1951-04-17, 61 y.o.   MRN: [id number]  HPI [name] returns for increased pain in the right thumb. She traumatized the thumb with a car door, sustained a comminuted longitudinal fracture and has a small subungual hematoma. She was seen by Dr. Mina Marble on the 17th. The hematoma is now larger and more painful.   Review of Systems     Objective:   Physical Exam        Assessment & Plan:  Subungual  Hematoma - advised her to return to Dr. Mina Marble for incision of the nail to release pressure.

## 2012-08-12 ENCOUNTER — Telehealth: Payer: Self-pay | Admitting: Internal Medicine

## 2012-08-12 ENCOUNTER — Emergency Department (HOSPITAL_COMMUNITY)
Admission: EM | Admit: 2012-08-12 | Discharge: 2012-08-12 | Disposition: A | Discharge status: Home / Self Care | Payer: BC Managed Care – PPO | Attending: Emergency Medicine | Admitting: Emergency Medicine

## 2012-08-12 ENCOUNTER — Encounter (HOSPITAL_COMMUNITY): Payer: Self-pay

## 2012-08-12 ENCOUNTER — Emergency Department (HOSPITAL_COMMUNITY): Payer: BC Managed Care – PPO

## 2012-08-12 DIAGNOSIS — J069 Acute upper respiratory infection, unspecified: Secondary | ICD-10-CM

## 2012-08-12 DIAGNOSIS — Z8679 Personal history of other diseases of the circulatory system: Secondary | ICD-10-CM | POA: Insufficient documentation

## 2012-08-12 DIAGNOSIS — Z79899 Other long term (current) drug therapy: Secondary | ICD-10-CM | POA: Insufficient documentation

## 2012-08-12 DIAGNOSIS — K219 Gastro-esophageal reflux disease without esophagitis: Secondary | ICD-10-CM | POA: Insufficient documentation

## 2012-08-12 DIAGNOSIS — Z87442 Personal history of urinary calculi: Secondary | ICD-10-CM | POA: Insufficient documentation

## 2012-08-12 DIAGNOSIS — Z8719 Personal history of other diseases of the digestive system: Secondary | ICD-10-CM | POA: Insufficient documentation

## 2012-08-12 MED ORDER — BENZONATATE 100 MG PO CAPS
100.0000 mg | ORAL_CAPSULE | Freq: Three times a day (TID) | ORAL | Status: DC
Start: 2012-08-12 — End: 2012-10-03

## 2012-08-12 MED ORDER — AZITHROMYCIN 250 MG PO TABS: ORAL_TABLET | ORAL | Status: DC

## 2012-08-12 NOTE — ED Provider Notes (Addendum)
History     CSN: [id number]  Arrival date & time 08/12/12  2536   First MD Initiated Contact with Patient 08/12/12 0700      Chief Complaint  Patient presents with  . Cough    (Consider location/radiation/quality/duration/timing/severity/associated sxs/prior treatment) HPI  Pt relates four  days ago she started having to clear her throat for a tickle in her throat. Three days ago she started having a cough. She states she was coughing up yellow mucus, sneezing, having some yellow rhinorrhea, and states sometimes when she coughs she starts having spasms in her throat and feels like she cannot breathe. She has had this before and was told she had laryngospasm. She was seen at an urgent care 2 days ago and was told she had a viral illness and was advised to take Mucinex and Sudafed over-the-counter. Patient however is wanting to be put on antibiotics. She denies nausea, vomiting, or diarrhea. She states her sore throat is gone. She denies any fever. She also denies wheezing.  PCP Dr. Debby Bud  Past Medical History  Diagnosis Date  . Aortic valve disorders   . Mitral valve insufficiency and aortic valve insufficiency   . Other acute reactions to stress   . Internal hemorrhoids without mention of complication   . Unspecified constipation   . Irritable bowel syndrome   . Hemorrhage of rectum and anus   . Microscopic hematuria   . Personal history of urinary calculi   . Other plastic surgery for unacceptable cosmetic appearance     Inj tx fllers/expander  . Esophageal reflux   . Postmenopausal atrophic vaginitis   . Backache, unspecified   . Allergic rhinitis, cause unspecified   . Conversion disorder   . Calculus of kidney     Past Surgical History  Procedure Laterality Date  . Cosmetic procedures w/injection therapy    . Wisdom tooth extraction      Family History  Problem Relation Age of Onset  . Hyperlipidemia Father   . COPD Father     Smoker  . Hyperlipidemia Mother    . Diabetes Neg Hx   . Colon cancer Neg Hx   . Breast cancer Neg Hx   . Coronary artery disease Neg Hx   . Cancer Neg Hx     breast or colon    History  Substance Use Topics  . Smoking status: Never Smoker   . Smokeless tobacco: Not on file  . Alcohol Use: No  Lives at home Lives with spouse employed  OB History   Grav Para Term Preterm Abortions TAB SAB Ect Mult Living                  Review of Systems  All other systems reviewed and are negative.    Allergies  Codeine; Erythromycin; Penicillins; and Sulfonamide derivatives  Home Medications   Current Outpatient Rx  Name  Route  Sig  Dispense  Refill  . ALPRAZolam (XANAX) 0.5 MG tablet   Oral   Take 0.5 mg by mouth every 6 (six) hours as needed.         . Botulinum Toxin Type A, Cosm, (BOTOX COSMETIC) 50 UNITS SOLR   Intramuscular   Inject into the muscle. 1 yearly injection          . ibuprofen (ADVIL,MOTRIN) 200 MG tablet   Oral   Take 200 mg by mouth as needed.           [name] EXPIRED: pantoprazole (PROTONIX) 40  MG tablet   Oral   Take 40 mg by mouth as needed.         [name] PREMARIN vaginal cream      TAKE AS DIRECTED   127 g   1     BP 119/61  Pulse 85  Temp(Src) 98.3 F (36.8 C) (Oral)  Resp 18  Ht 5\' 2"  (1.575 m)  Wt 104 lb (47.174 kg)  BMI 19.02 kg/m2  SpO2 100%  Vital signs normal    Physical Exam  Nursing note and vitals reviewed. Constitutional: She is oriented to person, place, and time. She appears well-developed and well-nourished.  Non-toxic appearance. She does not appear ill. No distress.  HENT:  Head: Normocephalic and atraumatic.  Right Ear: External ear normal.  Left Ear: External ear normal.  Nose: Nose normal. No mucosal edema or rhinorrhea.  Mouth/Throat: Oropharynx is clear and moist and mucous membranes are normal. No dental abscesses or edematous.  Eyes: Conjunctivae and EOM are normal. Pupils are equal, round, and reactive to light.  Neck: Normal range of  motion and full passive range of motion without pain. Neck supple.  Cardiovascular: Normal rate, regular rhythm and normal heart sounds.  Exam reveals no gallop and no friction rub.   No murmur heard. Pulmonary/Chest: Effort normal and breath sounds normal. No respiratory distress. She has no wheezes. She has no rhonchi. She has no rales. She exhibits no tenderness and no crepitus.  Rare cough  Abdominal: Soft. Normal appearance and bowel sounds are normal. She exhibits no distension. There is no tenderness. There is no rebound and no guarding.  Musculoskeletal: Normal range of motion. She exhibits no edema and no tenderness.  Moves all extremities well.   Neurological: She is alert and oriented to person, place, and time. She has normal strength. No cranial nerve deficit.  Skin: Skin is warm, dry and intact. No rash noted. No erythema. No pallor.  Psychiatric: She has a normal mood and affect. Her speech is normal and behavior is normal. Her mood appears not anxious.    ED Course  Procedures (including critical care time)  Chest x-ray was done to make sure she does not have a indolent pneumonia. I discussed patient however at this point of her illness she is most likely to have a viral illness and antibiotics really would not help her disease course.  Pt does not take antibiotics for SBE prophylaxis for her Ao and MV problems. Sees Dr Daleen Squibb, [facility].   At discharge pt requesting tessalon perles for cough. We discussed that having yellow/ green mucus does not automatically mean she has a bacterial infection, it can still be viral.    Dg Chest 2 View  08/12/2012  *RADIOLOGY REPORT*  Clinical Data: Cough  CHEST - 2 VIEW  Comparison: 09/21/2011  Findings: The heart size appears within normal limits.  There is no pleural effusion or edema.  Increased lung volumes.  No airspace opacity.  The visualized osseous structures appear intact.  IMPRESSION:  1. No acute cardiopulmonary  abnormalities.   Original Report Authenticated By: Signa Kell, M.D.      1. URI (upper respiratory infection)    Plan discharge  Devoria Albe, MD, FACEP    MDM          Ward Givens, MD 08/12/12 2130  Ward Givens, MD 08/12/12 0800

## 2012-08-12 NOTE — Telephone Encounter (Signed)
Patient with a flare of bronchitis with inspissated mucus - she could not clear and did go to the ED this AM. She was not given antibiotics. She reports she has green sputum per her usual URI.  Plan - Z-pak

## 2012-08-12 NOTE — ED Notes (Signed)
Pt reports that she was coughing this morning and could not catch her breath. Pt taking mucinex and sudafed for nasal congestion. Symptoms accompanied by chills and fatigue. Pt in no distress at this time.

## 2012-08-13 ENCOUNTER — Ambulatory Visit: Payer: BC Managed Care – PPO | Admitting: Internal Medicine

## 2012-08-14 ENCOUNTER — Encounter: Payer: Self-pay | Admitting: Internal Medicine

## 2012-08-14 ENCOUNTER — Ambulatory Visit: Payer: BC Managed Care – PPO | Admitting: Internal Medicine

## 2012-08-14 ENCOUNTER — Ambulatory Visit (INDEPENDENT_AMBULATORY_CARE_PROVIDER_SITE_OTHER): Payer: BC Managed Care – PPO | Admitting: Internal Medicine

## 2012-08-14 ENCOUNTER — Telehealth: Payer: Self-pay | Admitting: Internal Medicine

## 2012-08-14 VITALS — BP 108/68 | HR 83 | Temp 97.9°F | Resp 10 | Wt 104.0 lb

## 2012-08-14 DIAGNOSIS — R054 Cough syncope: Secondary | ICD-10-CM

## 2012-08-14 DIAGNOSIS — R059 Cough, unspecified: Secondary | ICD-10-CM

## 2012-08-14 NOTE — Telephone Encounter (Signed)
Patient Information:  Caller Name: Anupama  Phone: [phone]  Patient: [name]  Gender: Female  DOB: [birth year]  Age: 62 Years  PCP: Illene Regulus (Adults only)  Office Follow Up:  Does the office need to follow up with this patient?: Yes  Instructions For The Office: Please see note and call back ASAP with Dr Debby Bud recommendation.  RN Note:  Severe coughing spasm for "5 minutes" with stridor occurred this afternoon at work.  Wants MD to advise if should go to ED since ED did "not do anything" last time she was there. No antibiotics were given in ED because it was "viral."   Dr Debby Bud ordered Z-pack 08/12/12. Saw Dr Debby Bud again 08/14/12. Dr Debby Bud referred to Pulmonologist. Was able to move up appointment with Pulmonologist to 08/15/12. Asking for Pulmonoligist to see her today or if Pulmonologist would see her in the ED.  Explained in the ED, she would be evaluated by ED specialist.  Asking for Dr Debby Bud input about what to do.  Please discuss with MD and call back ASAP with recommendation.  Symptoms  Reason For Call & Symptoms: Emergent Call: Unable to catch her breath and stidor with coughing spells. Last spasm lasted > 5 minutes; felt was unable to get air.  Currently at work now.  Talking in full sentences.  Denies chest tightness or shortness of breath now.  Seen in ED 08/12/12 then followed up with Dr Debby Bud 08/12/12 and 08/14/12.  On antibiotics.  Was referred to Pulmonologist but unable to be seen until 08/15/12.  Asking if should go back to ED today for shortness of breath whith coughing spells.  Reviewed Health History In EMR: Yes  Reviewed Medications In EMR: Yes  Reviewed Allergies In EMR: Yes  Reviewed Surgeries / Procedures: Yes  Date of Onset of Symptoms: 08/12/2012  Guideline(s) Used:  Cough  Disposition Per Guideline:   Go to ED Now (or to Office with PCP Approval)  Reason For Disposition Reached:   Patient sounds very sick or weak to the  triager  Advice Given:  Reassurance  Coughing is the way that our lungs remove irritants and mucus. It helps protect our lungs from getting pneumonia.  Cough Medicines:  Home Remedy - Hard Candy: Hard candy works just as well as medicine-flavored OTC cough drops. Diabetics should use sugar-free candy.  Home Remedy - Honey: This old home remedy has been shown to help decrease coughing at night. The adult dosage is 2 teaspoons (10 ml) at bedtime. Honey should not be given to infants under one year of age.  Coughing Spasms:  Drink warm fluids. Inhale warm mist (Reason: both relax the airway and loosen up the phlegm).  Suck on cough drops or hard candy to coat the irritated throat.  Patient Refused Recommendation:  Patient Will Follow Up With Office Later  Wants MD to advise if needs to return to ED or if can see Pulmonologist 08/14/12.

## 2012-08-14 NOTE — Telephone Encounter (Signed)
Called patient - sound fine: no wheezing heard, speaking whole sentences.  Advised to continue present meds Try vaporizer Keep appointment with Dr. Sherene Sires tomorrow

## 2012-08-14 NOTE — Patient Instructions (Addendum)
Cough -  finish the z-pak     Continue the tessalon perles, Mucinex DM      You may want to try a cough syrup to sooth the cough - you can substitute robitussin DM for the Mucinex     For episodes when your breath is cut off -- remain calm, shallow respirations ( panting)     Referral to Dr. Kriste Basque for pulmonary consult - will try to facilitate that appointment.

## 2012-08-15 ENCOUNTER — Telehealth: Payer: Self-pay | Admitting: Internal Medicine

## 2012-08-15 ENCOUNTER — Ambulatory Visit (INDEPENDENT_AMBULATORY_CARE_PROVIDER_SITE_OTHER): Payer: BC Managed Care – PPO | Admitting: Internal Medicine

## 2012-08-15 ENCOUNTER — Encounter: Payer: Self-pay | Admitting: Internal Medicine

## 2012-08-15 VITALS — BP 120/70 | HR 73 | Temp 100.2°F | Ht 62.0 in | Wt 104.0 lb

## 2012-08-15 DIAGNOSIS — R059 Cough, unspecified: Secondary | ICD-10-CM

## 2012-08-15 MED ORDER — PANTOPRAZOLE SODIUM 40 MG PO TBEC: DELAYED_RELEASE_TABLET | ORAL | Status: DC

## 2012-08-15 MED ORDER — PREDNISONE (PAK) 10 MG PO TABS: ORAL_TABLET | ORAL | Status: DC

## 2012-08-15 NOTE — Progress Notes (Signed)
  Subjective:    Patient ID: [name], female    DOB: 12-Mar-1951  MRN: [id number]  HPI  76 yowf never smoker with new onset " throat drainage" w/in a few years of arriving in [place] in late 80's and placed on shots for up to 10 years some better worse 3 years off shots and back on shots ever since but still having intermittent problems with tickle in throat dx as vcd in early 2000s sometimes  exac bycig smoke, rx with relaxation techniques but no meds effective to date    08/15/2012 1st pulmonary eval cc recurrent tickling throat leading to severe choking spells started 08/08/12 p latex enamel  paint  Exposure  in her  house   rx by UC then ER then [facility] rx zmax and robitissuin. Thinks may have had gerd prior to last episode but denies any obvious chronic gerd or overt sinus symptoms assoc itch sneeze or ant rhinorrhea. No other obvious exp hex. Only sob with cough, no wheeze, not brought on be exercise.   Last episode severe tickle/ choking was > 1 year prior to OV    Sleeping ok without nocturnal  or early am exacerbation  of respiratory  c/o's or need for noct saba. Also denies any obvious fluctuation of symptoms with weather or environmental changes or other aggravating or alleviating factors except as outlined above     Review of Systems  Constitutional: Negative for fever, chills and unexpected weight change.  HENT: Positive for congestion and sore throat. Negative for ear pain, nosebleeds, rhinorrhea, sneezing, trouble swallowing, dental problem, voice change, postnasal drip and sinus pressure.   Eyes: Negative for visual disturbance.  Respiratory: Positive for cough. Negative for choking and shortness of breath.   Cardiovascular: Negative for chest pain and leg swelling.  Gastrointestinal: Negative for vomiting, abdominal pain and diarrhea.  Genitourinary: Negative for difficulty urinating.  Musculoskeletal: Negative for arthralgias.  Skin: Negative for rash.  Neurological:  Positive for headaches. Negative for tremors and syncope.  Hematological: Does not bruise/bleed easily.       Objective:   Physical Exam  amb wf nad Wt Readings from Last 3 Encounters:  08/15/12 104 lb (47.174 kg)  08/14/12 104 lb (47.174 kg)  08/12/12 104 lb (47.174 kg)    HEENT: nl dentition, turbinates, and orophanx. Nl external ear canals without cough reflex   NECK :  without JVD/Nodes/TM/ nl carotid upstrokes bilaterally   LUNGS: no acc muscle use, clear to A and P bilaterally without cough on insp or exp maneuvers   CV:  RRR  no s3 or murmur or increase in P2, no edema   ABD:  soft and nontender with nl excursion in the supine position. No bruits or organomegaly, bowel sounds nl  MS:  warm without deformities, calf tenderness, cyanosis or clubbing  SKIN: warm and dry without lesions    NEURO:  alert, approp, no deficits     cxr 08/12/12 No acute cardiopulmonary abnormalities.      Assessment & Plan:

## 2012-08-15 NOTE — Telephone Encounter (Signed)
Pt wanted to know if she could use robutussin instead of delsym because she already has this medication at home. I advised she could use any OTC cough syrup. Carron Curie, CMA

## 2012-08-15 NOTE — Patient Instructions (Addendum)
The key to effective treatment for your cough is eliminating the non-stop cycle of cough you're stuck in long enough to let your airway heal completely and then see if there is anything still making you cough once you stop the cough suppression, but this should take no more than 5 days to figuure out  First take delsym two tsp every 12 hours and supplement if needed with  tramadol 50 mg up to 2 every 4 hours to suppress the urge to cough. Swallowing water or using ice chips/non mint and menthol containing candies (such as lifesavers or sugarless jolly ranchers) are also effective.  You should rest your voice and avoid activities that you know make you cough.  Once you have eliminated the cough for 3 straight days try reducing the tramadol first,  then the delsym as tolerated.    Pantoprazole 40 mg  Take 30-60 min before first meal of the day and Zantac (ranitidine) 300 mg  one bedtime until cough is completely gone for at least a week without the need for cough suppression  I think of reflux for chronic cough like I do oxygen for fire (doesn't cause the fire but once you get the oxygen suppressed it usually goes away regardless of the exact cause).  GERD (REFLUX)  is an extremely common cause of respiratory symptoms, many times with no significant heartburn at all.    It can be treated with medication, but also with lifestyle changes including avoidance of late meals, excessive alcohol, smoking cessation, and avoid fatty foods, chocolate, peppermint, colas, red wine, and acidic juices such as orange juice.  NO MINT OR MENTHOL PRODUCTS SO NO COUGH DROPS  USE SUGARLESS CANDY INSTEAD (jolley ranchers or Stover's)  NO OIL BASED VITAMINS - use powdered substitutes.    Prednisone 10 mg take  4 each am x 2 days,   2 each am x 2 days,  1 each am x2days and stop     Pulmonary follow up is as needed

## 2012-08-15 NOTE — Telephone Encounter (Signed)
Error.[name] ° °

## 2012-08-16 DIAGNOSIS — R058 Other specified cough: Secondary | ICD-10-CM | POA: Insufficient documentation

## 2012-08-16 NOTE — Progress Notes (Signed)
  Subjective:    Patient ID: [name], female    DOB: 08/14/1950, 62 y.o.   MRN: [id number]  HPI Ms. Sutter presents for persistent episodes of stridor and sensation of suffocation with paroxysmal coughing. Reviewed previous office note and ED records. She is in no distress at this visit.  PMH, FamHx and SocHx reviewed for any changes and relevance. Current Outpatient Prescriptions on File Prior to Visit  Medication Sig Dispense Refill  . azithromycin (ZITHROMAX) 250 MG tablet As directed  6 tablet  0  . benzonatate (TESSALON) 100 MG capsule Take 1 capsule (100 mg total) by mouth every 8 (eight) hours.  21 capsule  0  . Botulinum Toxin Type A, Cosm, (BOTOX COSMETIC) 50 UNITS SOLR Inject into the muscle. 3 month  injection      . ibuprofen (ADVIL,MOTRIN) 200 MG tablet Take 200 mg by mouth as needed.        [name] PREMARIN vaginal cream TAKE AS DIRECTED  127 g  1   No current facility-administered medications on file prior to visit.      Review of Systems System review is negative for any constitutional, cardiac, pulmonary, GI or neuro symptoms or complaints other than as described in the HPI.     Objective:   Physical Exam Filed Vitals:   08/14/12 0849  BP: 108/68  Pulse: 83  Temp: 97.9 F (36.6 C)  Resp: 10   gen'l- WNWD white woman in no distress HEENT- normal Cor 2+ radial pulse, RRR Pulm - no increased WOB, no rales or wheezing Psych - worried       Assessment & Plan:  Cough with stridor  Plan Continue tessalon perles, robitussin DM, finish z-pak  Pulmonary referral.

## 2012-08-16 NOTE — Assessment & Plan Note (Signed)
The most common causes of chronic cough in immunocompetent adults include the following: upper airway cough syndrome (UACS), previously referred to as postnasal drip syndrome (PNDS), which is caused by variety of rhinosinus conditions; (2) asthma; (3) GERD; (4) chronic bronchitis from cigarette smoking or other inhaled environmental irritants; (5) nonasthmatic eosinophilic bronchitis; and (6) bronchiectasis.   These conditions, singly or in combination, have accounted for up to 94% of the causes of chronic cough in prospective studies.   Other conditions have constituted no >6% of the causes in prospective studies These have included bronchogenic carcinoma, chronic interstitial pneumonia, sarcoidosis, left ventricular failure, ACEI-induced cough, and aspiration from a condition associated with pharyngeal dysfunction.    Chronic cough is often simultaneously caused by more than one condition. A single cause has been found from 38 to 82% of the time, multiple causes from 18 to 62%. Multiply caused cough has been the result of three diseases up to 42% of the time.       This is almost certainly an example of  Classic Upper airway cough syndrome, so named because it's frequently impossible to sort out how much is  CR/sinusitis with freq throat clearing (which can be related to primary GERD)   vs  causing  secondary (" extra esophageal")  GERD from wide swings in gastric pressure that occur with throat clearing, often  promoting self use of mint and menthol lozenges that reduce the lower esophageal sphincter tone and exacerbate the problem further in a cyclical fashion.   These are the same pts (now being labeled as having "irritable larynx syndrome" by some cough centers) who not infrequently have a history of having failed to tolerate ace inhibitors,  dry powder inhalers or biphosphonates or report having atypical reflux symptoms that don't respond to standard doses of PPI , and are easily confused as  having aecopd or asthma flares by even experienced allergists/ pulmonologists.   Unlikely to be able to stop the problem completely because it flares so rarely, but it may be able to blunt the severity if the cyclical component is addressed aggressively and stopped before it gets self- promulgated  See the written copy of this report in the patient's paper medical record.  These results did not interface directly into the electronic medical record and are summarized here.   Other option if throat clearing continues is either neurontin, elavil and or 1st gen H1 as per guidelines

## 2012-08-18 ENCOUNTER — Institutional Professional Consult (permissible substitution): Payer: BC Managed Care – PPO | Admitting: Internal Medicine

## 2012-08-18 ENCOUNTER — Ambulatory Visit: Payer: BC Managed Care – PPO | Admitting: Internal Medicine

## 2012-08-26 ENCOUNTER — Encounter: Payer: Self-pay | Admitting: Internal Medicine

## 2012-08-27 ENCOUNTER — Encounter: Payer: Self-pay | Admitting: Internal Medicine

## 2012-08-28 NOTE — Telephone Encounter (Signed)
Dr Sherene Sires, patient last seen 08/15/12 and directions were as follows:  08/15/12--Dr Sherene Sires First take delsym two tsp every 12 hours and supplement if needed with tramadol 50 mg up to 2 every 4 hours to suppress the urge to cough.  Once you have eliminated the cough for 3 straight days try reducing the tramadol first, then the delsym as tolerated.  Pantoprazole 40 mg Take 30-60 min before first meal of the day and Zantac (ranitidine) 300 mg one bedtime until cough is completely gone for at least a week without the need for cough suppression    [GERD (REFLUX) guidelines]  Prednisone 10 mg take 4 each am x 2 days, 2 each am x 2 days, 1 each am x2days and stop  Pulmonary follow up is as needed  Patient currently using 1 Pantoprazole in the am and 1 Rantidine before bed and 10L of Robitussin before bed And cough is improving.  Dr Sherene Sires, please advise if patient is to stay on the Pantoprazole daily as maintenance or if she is to stop.

## 2012-09-11 ENCOUNTER — Other Ambulatory Visit (INDEPENDENT_AMBULATORY_CARE_PROVIDER_SITE_OTHER): Payer: BC Managed Care – PPO

## 2012-09-11 ENCOUNTER — Ambulatory Visit (INDEPENDENT_AMBULATORY_CARE_PROVIDER_SITE_OTHER): Payer: BC Managed Care – PPO | Admitting: Internal Medicine

## 2012-09-11 ENCOUNTER — Encounter: Payer: Self-pay | Admitting: Internal Medicine

## 2012-09-11 ENCOUNTER — Other Ambulatory Visit (HOSPITAL_COMMUNITY)
Admission: RE | Admit: 2012-09-11 | Discharge: 2012-09-11 | Disposition: A | Discharge status: Home / Self Care | Payer: BC Managed Care – PPO | Source: Ambulatory Visit | Attending: Internal Medicine | Admitting: Internal Medicine

## 2012-09-11 VITALS — BP 102/80 | HR 62 | Temp 98.2°F | Resp 16 | Ht 62.0 in | Wt 104.0 lb

## 2012-09-11 DIAGNOSIS — J309 Allergic rhinitis, unspecified: Secondary | ICD-10-CM

## 2012-09-11 DIAGNOSIS — R05 Cough: Secondary | ICD-10-CM

## 2012-09-11 DIAGNOSIS — K219 Gastro-esophageal reflux disease without esophagitis: Secondary | ICD-10-CM

## 2012-09-11 DIAGNOSIS — R059 Cough, unspecified: Secondary | ICD-10-CM

## 2012-09-11 DIAGNOSIS — Z0000 Encounter for general adult medical examination without abnormal findings: Secondary | ICD-10-CM

## 2012-09-11 DIAGNOSIS — Z01419 Encounter for gynecological examination (general) (routine) without abnormal findings: Secondary | ICD-10-CM | POA: Insufficient documentation

## 2012-09-11 LAB — COMPREHENSIVE METABOLIC PANEL
Albumin: 4.3 g/dL (ref 3.5–5.2)
CO2: 27 mEq/L (ref 19–32)
GFR: 74.56 mL/min (ref >60.00)
Glucose, Bld: 89 mg/dL (ref 70–99)
Potassium: 4.8 mEq/L (ref 3.5–5.1)
Sodium: 136 mEq/L (ref 135–145)
Total Protein: 7.5 g/dL (ref 6.0–8.3)

## 2012-09-11 LAB — CBC WITH DIFFERENTIAL/PLATELET
Eosinophils Relative: 3.2 % (ref 0.0–5.0)
HCT: 38.5 % (ref 36.0–46.0)
Monocytes Relative: 7.8 % (ref 3.0–12.0)
Neutrophils Relative %: 64.5 % (ref 43.0–77.0)
Platelets: 165 10*3/uL (ref 150.0–400.0)
WBC: 6.1 10*3/uL (ref 4.5–10.5)

## 2012-09-11 MED ORDER — AZITHROMYCIN 250 MG PO TABS: ORAL_TABLET | ORAL | Status: DC

## 2012-09-11 MED ORDER — RANITIDINE HCL 150 MG PO TABS: 150.0000 mg | ORAL_TABLET | Freq: Every day | ORAL | Status: DC

## 2012-09-11 MED ORDER — ESTROGENS, CONJUGATED 0.625 MG/GM VA CREA: TOPICAL_CREAM | VAGINAL | Status: DC

## 2012-09-13 NOTE — Assessment & Plan Note (Signed)
Currently stable and doing well, though the allergy season is fast approaching.

## 2012-09-13 NOTE — Assessment & Plan Note (Signed)
She has seen Dr. Sherene Sires and with GERD suppression she is doing [facility] better.   Plan Continue present regimen  Return to Dr. Sherene Sires as needed.
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
## 2012-09-13 NOTE — Assessment & Plan Note (Signed)
GERD was a primary cause of her cough. She is now taking PPI in AM and H2 blocker in the PM and doing well.   Plan Continue present regimen
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
## 2012-09-24 ENCOUNTER — Encounter: Payer: Self-pay | Admitting: Internal Medicine

## 2012-09-24 ENCOUNTER — Ambulatory Visit (INDEPENDENT_AMBULATORY_CARE_PROVIDER_SITE_OTHER): Payer: BC Managed Care – PPO | Admitting: Internal Medicine

## 2012-09-24 VITALS — BP 90/50 | HR 76 | Ht 61.5 in | Wt 106.4 lb

## 2012-09-24 DIAGNOSIS — K219 Gastro-esophageal reflux disease without esophagitis: Secondary | ICD-10-CM

## 2012-09-24 DIAGNOSIS — K589 Irritable bowel syndrome without diarrhea: Secondary | ICD-10-CM

## 2012-09-24 NOTE — Progress Notes (Signed)
HISTORY OF PRESENT ILLNESS:  [name] is a 62 y.o. female with the below listed medical history who has been seen in this office previously for GERD, functional dyspepsia, and irritable bowel syndrome. She presents today wondering about worsening problems with reflux. As well she wanted to discuss the need for colonoscopy and possible upper endoscopy. Patient reports to me that she was having some respiratory difficulties earlier in the year. About a month or so ago she reports drinking orange juice with subsequent coughing spell that sounded like laryngospasm. She apparently went to the emergency room. Subsequently seen by her primary care provider who made of pulmonary referral. Patient was already on a Z-Pak. Subsequently prescribed prednisone pantoprazole and ranitidine. With time she improved. She is now on pantoprazole and ranitidine. She had been off GERD medicines for years. However, in retrospect she feels that this is helpful. She denies dysphagia or abdominal pain. Still with constipation predominant IBS without change. No bleeding. Last colonoscopy in 2005 was normal. Routine followup scheduled for next year. Her last upper endoscopy in 2000, to evaluate globus, was also normal. No interval change in her family history. Review of outside laboratories including CBC revealed no abnormalities.  REVIEW OF SYSTEMS:  All non-GI ROS negative except for heart murmur  Past Medical History  Diagnosis Date  . Aortic valve disorders   . Mitral valve insufficiency and aortic valve insufficiency   . Other acute reactions to stress   . Internal hemorrhoids without mention of complication   . Unspecified constipation   . Irritable bowel syndrome   . Hemorrhage of rectum and anus   . Microscopic hematuria   . Personal history of urinary calculi   . Other plastic surgery for unacceptable cosmetic appearance     Inj tx fllers/expander  . Esophageal reflux   . Postmenopausal atrophic  vaginitis   . Backache, unspecified   . Allergic rhinitis, cause unspecified   . Conversion disorder   . Calculus of kidney     Past Surgical History  Procedure Laterality Date  . Cosmetic procedures w/injection therapy    . Wisdom tooth extraction    . Breast biopsy Right     Social History Aviendha Azbell  reports that she has never smoked. She has never used smokeless tobacco. She reports that she does not drink alcohol or use illicit drugs.  family history includes COPD in her father; Hyperlipidemia in her father and mother; and Hypertension in her father and mother.  There is no history of Diabetes, and Colon cancer, and Breast cancer, and Coronary artery disease, and Cancer, .  Allergies  Allergen Reactions  . Codeine Other (See Comments)    Doesn't remeber  . Epinephrine     Heart racing   . Erythromycin Other (See Comments)    Doesn't remember   . Penicillins Other (See Comments)    Doesn't remember   . Sulfonamide Derivatives Other (See Comments)    Doesn't remember        PHYSICAL EXAMINATION: Vital signs: BP 90/50  Pulse 76  Ht 5' 1.5" (1.562 m)  Wt 106 lb 6 oz (48.251 kg)  BMI 19.78 kg/m2 General: Well-developed, well-nourished, no acute distress HEENT: Sclerae are anicteric, conjunctiva pink. Oral mucosa intact Lungs: Clear Heart: Regular Abdomen: soft, nontender, nondistended, no obvious ascites, no peritoneal signs, normal bowel sounds. No organomegaly. Extremities: No edema Psychiatric: alert and oriented x3. Cooperative   ASSESSMENT:  #1. GERD. Not clear to me that this explains her problems with  probable acute laryngospasm. She does have a tendency toward anxiety and panic. In any event, she is doing better. Furthermore, she feels PPI and ranitidine are helpful.she does inquire about possibly getting off these medications as "I wanted to be on medicines" #2. Normal upper endoscopy 2000 #3. Normal colonoscopy 2005    PLAN:  #1 reflux  precautions #2. Lowest dose of PPI or H2 receptor antagonist to control reflux symptoms #3. No indication for upper endoscopy #4. Keep plans for routine repeat screening colonoscopy in 2015. Interval followup as needed

## 2012-09-24 NOTE — Patient Instructions (Addendum)
Please follow up with Dr. Marina Goodell as needed                                                     We are excited to introduce MyChart, a new best-in-class service that provides you online access to important information in your electronic medical record. We want to make it easier for you to view your health information - all in one secure location - when and where you need it. We expect MyChart will enhance the quality of care and service we provide.  When you register for MyChart, you can:    View your test results.    Request appointments and receive appointment reminders via email.    Request medication renewals.    View your medical history, allergies, medications and immunizations.    Communicate with your physician's office through a password-protected site.    Conveniently print information such as your medication lists.  To find out if MyChart is right for you, please talk to a member of our clinical staff today. We will gladly answer your questions about this free health and wellness tool.  If you are age 60 or older and want a member of your family to have access to your record, you must provide written consent by completing a proxy form available at our office. Please speak to our clinical staff about guidelines regarding accounts for patients younger than age 60.  As you activate your MyChart account and need any technical assistance, please call the MyChart technical support line at [phone]) or email your question to [email or web address]. If you email your question(s), please include your name, a return phone number and the best time to reach you.  If you have non-urgent health-related questions, you can send a message to our office through MyChart at [email or web address]. If you have a medical emergency, call 911.  Thank you for using MyChart as your new health and wellness resource!   MyChart licensed from [employer],   [id number]. Patents Pending.

## 2012-09-30 ENCOUNTER — Encounter: Payer: Self-pay | Admitting: Internal Medicine

## 2012-10-03 ENCOUNTER — Ambulatory Visit (INDEPENDENT_AMBULATORY_CARE_PROVIDER_SITE_OTHER): Payer: BC Managed Care – PPO | Admitting: Internal Medicine

## 2012-10-03 ENCOUNTER — Ambulatory Visit: Payer: BC Managed Care – PPO | Admitting: Internal Medicine

## 2012-10-03 ENCOUNTER — Encounter: Payer: Self-pay | Admitting: Internal Medicine

## 2012-10-03 VITALS — BP 110/70 | Temp 98.0°F | Wt 105.0 lb

## 2012-10-03 DIAGNOSIS — K529 Noninfective gastroenteritis and colitis, unspecified: Secondary | ICD-10-CM

## 2012-10-03 DIAGNOSIS — K5289 Other specified noninfective gastroenteritis and colitis: Secondary | ICD-10-CM

## 2012-10-03 MED ORDER — VITAMIN D 1000 UNITS PO TABS: 1000.0000 [IU] | ORAL_TABLET | Freq: Every day | ORAL | Status: DC

## 2012-10-03 NOTE — Patient Instructions (Signed)
Creon 1 with large meals Align 1 a day

## 2012-10-05 ENCOUNTER — Encounter: Payer: Self-pay | Admitting: Internal Medicine

## 2012-10-05 DIAGNOSIS — K529 Noninfective gastroenteritis and colitis, unspecified: Secondary | ICD-10-CM | POA: Insufficient documentation

## 2012-10-05 NOTE — Assessment & Plan Note (Signed)
4/14 likely viral Recovering Creon w/meals x few days Align x14 d

## 2012-10-05 NOTE — Progress Notes (Signed)
Subjective:     Diarrhea  This is a new problem. The current episode started in the past 7 days. The problem occurs 2 to 4 times per day. The problem has been resolved. The stool consistency is described as watery. Pertinent negatives include no abdominal pain, chills, coughing or headaches. The treatment provided mild relief.  The whole family was sick for a few days. The pt is much better - w/a little indigestion now   Past Medical History  Diagnosis Date  . Aortic valve disorders   . Mitral valve insufficiency and aortic valve insufficiency   . Other acute reactions to stress   . Internal hemorrhoids without mention of complication   . Unspecified constipation   . Irritable bowel syndrome   . Hemorrhage of rectum and anus   . Microscopic hematuria   . Personal history of urinary calculi   . Other plastic surgery for unacceptable cosmetic appearance     Inj tx fllers/expander  . Esophageal reflux   . Postmenopausal atrophic vaginitis   . Backache, unspecified   . Allergic rhinitis, cause unspecified   . Conversion disorder   . Calculus of kidney    Past Surgical History  Procedure Laterality Date  . Cosmetic procedures w/injection therapy    . Wisdom tooth extraction    . Breast biopsy Right    Family History  Problem Relation Age of Onset  . Hyperlipidemia Father   . COPD Father     Smoker  . Hyperlipidemia Mother   . Diabetes Neg Hx   . Colon cancer Neg Hx   . Breast cancer Neg Hx   . Coronary artery disease Neg Hx   . Cancer Neg Hx     breast or colon  . Hypertension Father   . Hypertension Mother    History   Social History  . Marital Status: Married    Spouse Name: N/A    Number of Children: 0  . Years of Education: N/A   Occupational History  . [employer]   . [employer]    Social History Main Topics  . Smoking status: Never Smoker   . Smokeless tobacco: Never Used  . Alcohol Use: No  . Drug Use: No  . Sexually Active: Not on  file   Other Topics Concern  . Not on file   Social History Narrative   [employer] in [place]. Married '90 - marriage is in very good shape '12, No children.  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 [facility] medical services for them if needed (Nov '11)             Current Outpatient Prescriptions on File Prior to Visit  Medication Sig Dispense Refill  . Botulinum Toxin Type A, Cosm, (BOTOX COSMETIC) 50 UNITS SOLR Inject into the muscle. 3 month  injection      . conjugated estrogens (PREMARIN) vaginal cream Place vaginally once a week. 1 g vag app weekly  42.5 g  1  . fluocinonide cream (LIDEX) 0.05 % Apply 1 application topically 2 (two) times daily.      [name] ibuprofen (ADVIL,MOTRIN) 200 MG tablet Take 200 mg by mouth as needed.        . pantoprazole (PROTONIX) 40 MG tablet Take 30-60 min before first meal of the day  30 tablet  11  . ranitidine (ZANTAC) 150 MG tablet Take 1 tablet (150 mg  total) by mouth at bedtime.  90 tablet  3   No current facility-administered medications on file prior to visit.      Review of Systems  Constitutional: Negative for chills, activity change, appetite change, fatigue and unexpected weight change.  HENT: Negative for congestion, mouth sores and sinus pressure.   Eyes: Negative for visual disturbance.  Respiratory: Negative for cough and chest tightness.   Gastrointestinal: Positive for diarrhea. Negative for nausea and abdominal pain.  Genitourinary: Negative for frequency, difficulty urinating and vaginal pain.  Musculoskeletal: Negative for back pain and gait problem.  Skin: Negative for pallor and rash.  Neurological: Negative for dizziness, tremors, weakness, numbness and headaches.  Psychiatric/Behavioral: Negative for confusion and sleep disturbance.   Constitutional:  Negative for fever, chills, activity change and unexpected weight change.  HEENT:   Negative for hearing loss, ear pain, congestion, neck stiffness and postnasal drip. Negative for sore throat or swallowing problems. Negative for dental complaints.   Eyes: Negative for vision loss or change in visual acuity.  Respiratory: Negative for chest tightness and wheezing. Negative for DOE.   Cardiovascular: Negative for chest pain or palpitations. No decreased exercise tolerance Gastrointestinal: No change in bowel habit. No bloating or gas. No reflux or indigestion Genitourinary: Negative for urgency, frequency, flank pain and difficulty urinating.  Musculoskeletal: Negative for myalgias, back pain, arthralgias and gait problem.  Neurological: Negative for dizziness, tremors, weakness and headaches.  Hematological: Negative for adenopathy.  Psychiatric/Behavioral: Negative for behavioral problems and dysphoric mood.       Objective:   Physical Exam  Constitutional: She appears well-developed. No distress.  HENT:  Head: Normocephalic.  Right Ear: External ear normal.  Left Ear: External ear normal.  Nose: Nose normal.  Mouth/Throat: Oropharynx is clear and moist.  Eyes: Conjunctivae are normal. Pupils are equal, round, and reactive to light. Right eye exhibits no discharge. Left eye exhibits no discharge.  Neck: Normal range of motion. Neck supple. No JVD present. No tracheal deviation present. No thyromegaly present.  Cardiovascular: Normal rate, regular rhythm and normal heart sounds.   Pulmonary/Chest: No stridor. No respiratory distress. She has no wheezes.  Abdominal: Soft. Bowel sounds are normal. She exhibits no distension and no mass. There is no tenderness. There is no rebound and no guarding.  Musculoskeletal: She exhibits no edema and no tenderness.  Lymphadenopathy:    She has no cervical adenopathy.  Neurological: She displays normal reflexes. No cranial nerve deficit. She exhibits normal muscle tone. Coordination normal.  Skin: No rash noted. No erythema.   Psychiatric: She has a normal mood and affect. Her behavior is normal. Judgment and thought content normal.   Filed Vitals:   10/03/12 1343  BP: 110/70  Temp: 98 F (36.7 C)   Wt Readings from Last 3 Encounters:  10/03/12 105 lb (47.628 kg)  09/24/12 106 lb 6 oz (48.251 kg)  09/11/12 104 lb (47.174 kg)   Gen'l: well nourished, well developed white Woman in no distress HEENT - [place]/AT, EACs/TMs normal, oropharynx with native dentition in good condition, no buccal or palatal lesions, posterior pharynx clear, mucous membranes moist. C&S clear, PERRLA, fundi - normal Neck - supple, no thyromegaly Nodes- negative submental, cervical, supraclavicular regions Chest - no deformity, no CVAT Lungs - clear without rales, wheezes. No increased work of breathing Breast - - Skin normal, nipples w/o discharge, no fixed mass or lesion, no axillary adenopathy. Cardiovascular - regular rate and rhythm, quiet precordium, no murmurs, rubs or gallops, 2+ radial,  DP and PT pulses Abdomen - BS+ x 4, no HSM, no guarding or rebound or tenderness Pelvic - NEG/BUS normal, small introitus (used adolescent speculum) - vaginal mucosa normal, retroflexed uterus, PAP scraping performed. Rectal - deferred  Extremities - no clubbing, cyanosis, edema or deformity.  Neuro - A&O x 3, CN II-XII normal, motor strength normal and equal, DTRs 2+ and symmetrical biceps, radial, and patellar tendons. Cerebellar - no tremor, no rigidity, fluid movement and normal gait. Derm - Head, neck, back, abdomen and extremities without suspicious lesions  Lab Results  Component Value Date   WBC 6.1 09/11/2012   HGB 13.0 09/11/2012   HCT 38.5 09/11/2012   PLT 165.0 09/11/2012   GLUCOSE 89 09/11/2012   CHOL 182 09/05/2011   TRIG 43.0 09/05/2011   HDL 80.00 09/05/2011   LDLCALC 93 09/05/2011   ALT 23 09/11/2012   AST 20 09/11/2012   NA 136 09/11/2012   K 4.8 09/11/2012   CL 102 09/11/2012   CREATININE 0.8 09/11/2012   BUN 17 09/11/2012   CO2  27 09/11/2012   TSH 1.61 09/05/2011         Assessment & Plan:

## 2012-10-29 ENCOUNTER — Telehealth: Payer: Self-pay

## 2012-10-29 ENCOUNTER — Telehealth: Payer: Self-pay | Admitting: Cardiology

## 2012-10-29 NOTE — Telephone Encounter (Signed)
Message copied by Jenascia Bumpass R on Wed Oct 29, 2012 11:06 AM ------      Message from: Wallace Going B      Created: Wed Oct 29, 2012  9:15 AM      Contact: PATIENT       PATIENT HAVING AURA MIGRAINE H/A-NEEDS APPT--[phone](C) ------

## 2012-10-29 NOTE — Telephone Encounter (Signed)
Patient states yesterday morning she worked out exsersising from 9:30 to 10:30, when finishing, her heart rate was 162 beats/minute. Later  in the afternoon at 2:30 Pm pt experience a migrine headache which she always has been dealing with; later about 1/2 an hour pt experience double vision, light headache, dizziness, this lasted a few seconds. Pt is drinking fluids because she could be dehydrated like a month ago, pt  had a virus and found out she was dehydrated. Pt feels fine now has no C/O. Pt is aware that if this happened again she needs to go to the ER.

## 2012-10-29 NOTE — Telephone Encounter (Signed)
Called patient and spoke to patient. And she does think she has aura migraines she has had since she was younger. Patient wants to know if she get's a referral from her pcp will Dr. Vickey Huger see her ? Dr.Dohmeier treats her for Muscle Fasciculations. Fax number was given to patient.  Patient states she is fine right now and she understood the process because of new problem.

## 2012-10-29 NOTE — Telephone Encounter (Signed)
New problem   Pt had something to happen to her yesterday that she's not sure if she needs to go to the ER about

## 2012-10-30 ENCOUNTER — Encounter: Payer: Self-pay | Admitting: Internal Medicine

## 2012-10-30 ENCOUNTER — Ambulatory Visit (INDEPENDENT_AMBULATORY_CARE_PROVIDER_SITE_OTHER): Payer: BC Managed Care – PPO | Admitting: Internal Medicine

## 2012-10-30 VITALS — BP 102/68 | HR 74 | Temp 97.7°F | Wt 102.0 lb

## 2012-10-30 DIAGNOSIS — G43109 Migraine with aura, not intractable, without status migrainosus: Secondary | ICD-10-CM

## 2012-10-30 DIAGNOSIS — K219 Gastro-esophageal reflux disease without esophagitis: Secondary | ICD-10-CM

## 2012-10-30 DIAGNOSIS — R55 Syncope and collapse: Secondary | ICD-10-CM

## 2012-10-30 MED ORDER — TOPIRAMATE 50 MG PO TABS: 50.0000 mg | ORAL_TABLET | Freq: Two times a day (BID) | ORAL | Status: DC

## 2012-10-30 NOTE — Progress Notes (Signed)
Subjective:    Patient ID: [name], female    DOB: 1950/12/03, 62 y.o.   MRN: [id number]  HPI  Here with ongoing recurring migraine with aura symtpoms with visual blurriness, now more freq over 2 per wk in the past 1-2 months.  Last episode 3 days ago lasted 30 min.  Saw opthomology recent with neg exam per pt.  Asks for referral back to Dr Priscella Mann.  Has recent what sounds like vasovagal episode with dizziness episode without fall or LOC, occurred after kickboxing class a few days ago, drinking more fluids, no further symptoms. Pt denies chest pain, increased sob or doe, wheezing, orthopnea, PND, increased LE swelling, palpitations, dizziness or syncope except for the above.  Pt denies new neurological symptoms such as new headache, or facial or extremity weakness or numbness, except for the above.   Pt denies polydipsia, polyuria. Denies worsening reflux, abd pain, dysphagia, n/v, bowel change or blood. Past Medical History  Diagnosis Date  . Aortic valve disorders   . Mitral valve insufficiency and aortic valve insufficiency   . Other acute reactions to stress   . Internal hemorrhoids without mention of complication   . Unspecified constipation   . Irritable bowel syndrome   . Hemorrhage of rectum and anus   . Microscopic hematuria   . Personal history of urinary calculi   . Other plastic surgery for unacceptable cosmetic appearance     Inj tx fllers/expander  . Esophageal reflux   . Postmenopausal atrophic vaginitis   . Backache, unspecified   . Allergic rhinitis, cause unspecified   . Conversion disorder   . Calculus of kidney    Past Surgical History  Procedure Laterality Date  . Cosmetic procedures w/injection therapy    . Wisdom tooth extraction    . Breast biopsy Right     reports that she has never smoked. She has never used smokeless tobacco. She reports that she does not drink alcohol or use illicit drugs. family history includes COPD in her father;  Hyperlipidemia in her father and mother; and Hypertension in her father and mother.  There is no history of Diabetes, and Colon cancer, and Breast cancer, and Coronary artery disease, and Cancer, . Allergies  Allergen Reactions  . Codeine Other (See Comments)    Doesn't remeber  . Epinephrine     Heart racing   . Erythromycin Other (See Comments)    Doesn't remember   . Penicillins Other (See Comments)    Doesn't remember   . Sulfonamide Derivatives Other (See Comments)    Doesn't remember    Current Outpatient Prescriptions on File Prior to Visit  Medication Sig Dispense Refill  . Botulinum Toxin Type A, Cosm, (BOTOX COSMETIC) 50 UNITS SOLR Inject into the muscle. 3 month  injection      . cholecalciferol (VITAMIN D) 1000 UNITS tablet Take 1 tablet (1,000 Units total) by mouth daily.  100 tablet  3  . conjugated estrogens (PREMARIN) vaginal cream Place vaginally once a week. 1 g vag app weekly  42.5 g  1  . fluocinonide cream (LIDEX) 0.05 % Apply 1 application topically 2 (two) times daily.      [name] ibuprofen (ADVIL,MOTRIN) 200 MG tablet Take 200 mg by mouth as needed.        . pantoprazole (PROTONIX) 40 MG tablet Take 30-60 min before first meal of the day  30 tablet  11  . ranitidine (ZANTAC) 150 MG tablet Take 1 tablet (150 mg total) by mouth  at bedtime.  90 tablet  3   No current facility-administered medications on file prior to visit.   Review of Systems  Constitutional: Negative for unexpected weight change, or unusual diaphoresis  HENT: Negative for tinnitus.   Eyes: Negative for photophobia Respiratory: Negative for choking and stridor.   Gastrointestinal: Negative for vomiting and blood in stool.  Genitourinary: Negative for hematuria and decreased urine volume.  Musculoskeletal: Negative for acute joint swelling Skin: Negative for color change and wound.  Neurological: Negative for tremors and numbness other than noted  Psychiatric/Behavioral: Negative for decreased  concentration or  hyperactivity.      Objective:   Physical Exam BP 102/68  Pulse 74  Temp(Src) 97.7 F (36.5 C) (Oral)  Wt 102 lb (46.267 kg)  BMI 18.96 kg/m2  SpO2 97% VS noted,  Constitutional: Pt appears well-developed and well-nourished.  HENT: Head: NCAT.  Right Ear: External ear normal.  Left Ear: External ear normal.  Eyes: Conjunctivae and EOM are normal. Pupils are equal, round, and reactive to light.  Neck: Normal range of motion. Neck supple.  Cardiovascular: Normal rate and regular rhythm.   Pulmonary/Chest: Effort normal and breath sounds normal.  Neurological: Pt is alert. Not confused , cn 2-12 intact, motor intact Skin: Skin is warm. No erythema.  Psychiatric: Pt behavior is normal. Thought content normal. 1-2+ nervous    Assessment & Plan:

## 2012-10-30 NOTE — Patient Instructions (Addendum)
Please take all new medication as prescribed; start the topamax at Half pill at night for 3 nights, then half pill twice per day for 3 days, then halp ill in am/whole pill in PM for 3 days, then whole pill twice per day after Please continue all other medications as before, and refills have been done if requested. You will be contacted regarding the referral for: Dr Dohmeir/neurology

## 2012-11-02 DIAGNOSIS — G43109 Migraine with aura, not intractable, without status migrainosus: Secondary | ICD-10-CM | POA: Insufficient documentation

## 2012-11-02 DIAGNOSIS — R55 Syncope and collapse: Secondary | ICD-10-CM | POA: Insufficient documentation

## 2012-11-02 NOTE — Assessment & Plan Note (Signed)
Chronic recurring, more freq lately, to start topamax 50 bid with taper up over 2 wks as instructed, refer back to neurology as per pt request

## 2012-11-02 NOTE — Assessment & Plan Note (Signed)
D/w pt nature of problem, difference between this and migraine, to cont fluids, exam benign, to f/u any worsening symptoms or concerns

## 2012-11-02 NOTE — Assessment & Plan Note (Signed)
stable overall by history and exam, recent data reviewed with pt, and pt to continue medical treatment as before,  to f/u any worsening symptoms or concerns Lab Results  Component Value Date   WBC 6.1 09/11/2012   HGB 13.0 09/11/2012   HCT 38.5 09/11/2012   PLT 165.0 09/11/2012   GLUCOSE 89 09/11/2012   CHOL 182 09/05/2011   TRIG 43.0 09/05/2011   HDL 80.00 09/05/2011   LDLCALC 93 09/05/2011   ALT 23 09/11/2012   AST 20 09/11/2012   NA 136 09/11/2012   K 4.8 09/11/2012   CL 102 09/11/2012   CREATININE 0.8 09/11/2012   BUN 17 09/11/2012   CO2 27 09/11/2012   TSH 1.61 09/05/2011

## 2012-11-24 ENCOUNTER — Encounter: Payer: Self-pay | Admitting: Internal Medicine

## 2012-11-24 ENCOUNTER — Ambulatory Visit: Payer: BC Managed Care – PPO | Admitting: Internal Medicine

## 2012-11-24 ENCOUNTER — Ambulatory Visit (INDEPENDENT_AMBULATORY_CARE_PROVIDER_SITE_OTHER): Payer: BC Managed Care – PPO | Admitting: Internal Medicine

## 2012-11-24 VITALS — BP 140/80 | HR 82 | Temp 97.9°F | Ht 62.0 in | Wt 104.8 lb

## 2012-11-24 DIAGNOSIS — R279 Unspecified lack of coordination: Secondary | ICD-10-CM

## 2012-11-24 DIAGNOSIS — R270 Ataxia, unspecified: Secondary | ICD-10-CM

## 2012-11-24 DIAGNOSIS — H539 Unspecified visual disturbance: Secondary | ICD-10-CM

## 2012-11-24 NOTE — Progress Notes (Signed)
  Subjective:    Patient ID: [name], female    DOB: Dec 16, 1950, 62 y.o.   MRN: [id number]  HPI [name] is seen as an acute walk in. She had an episode of double,blurred vision May 12 along with poor balance. She spoke with Dr. Vern Claude office who opined it was dehydration since it was just after a hard work out. She saw Dr. Dione Booze and had a normal ophthalmologic exam. She was seen by Dr. Jonny Ruiz May 15th for migraine with aura and vaso-vagal symptoms. She was to take topamax but did not start medication. She was referred to Dr. Golden Hurter.   She reports that she has had recurrent episodes of change in vision with blurring and possibly double vision. She has had mild ataxia and a sense of near-syncope. There was one episodes of paresthesia right face that was transient. She denies focal weakness, paresthesia, continuous double vision, any trouble with speech or cognition.  PMH, FamHx and SocHx reviewed for any changes and relevance. Current Outpatient Prescriptions on File Prior to Visit  Medication Sig Dispense Refill  . Botulinum Toxin Type A, Cosm, (BOTOX COSMETIC) 50 UNITS SOLR Inject into the muscle. 3 month  injection      . cholecalciferol (VITAMIN D) 1000 UNITS tablet Take 1 tablet (1,000 Units total) by mouth daily.  100 tablet  3  . conjugated estrogens (PREMARIN) vaginal cream Place vaginally once a week. 1 g vag app weekly  42.5 g  1  . fluocinonide cream (LIDEX) 0.05 % Apply 1 application topically 2 (two) times daily.      [name] ibuprofen (ADVIL,MOTRIN) 200 MG tablet Take 200 mg by mouth as needed.        . pantoprazole (PROTONIX) 40 MG tablet Take 30-60 min before first meal of the day  30 tablet  11  . ranitidine (ZANTAC) 150 MG tablet Take 1 tablet (150 mg total) by mouth at bedtime.  90 tablet  3  . topiramate (TOPAMAX) 50 MG tablet Take 1 tablet (50 mg total) by mouth 2 (two) times daily.  60 tablet  5   No current facility-administered medications on file prior to visit.       Review of Systems System review is negative for any constitutional, cardiac, pulmonary, GI or neuro symptoms or complaints other than as described in the HPI.     Objective:   Physical Exam Filed Vitals:   11/24/12 0820  BP: 140/80  Pulse: 82  Temp: 97.9 F (36.6 C)   Gen'l - WNWD very fit and young appearing woman in no distress Cor- 2+ radial, RRR Pulm - normal Neuro - A&O x 3, CN II0-XII normal, MS normal UE and LE, DTRs 2+ and symmetrical biceps and patellar, cerebvellar - no tremor and normal gait       Assessment & Plan:  1. Neuro - patient with multiple episodes of transient visual changes and ataxia. She also has a h/o and continued problems with fasciculations. Her exam in non-focal but there is a concern for cerebellar or brainstem lesion, possible occipital lesion and potential demyelinating disease.  Plan MRI brain w/o contrast  Keep appointment with Dr. Golden Hurter.

## 2012-11-24 NOTE — Patient Instructions (Addendum)
Symptoms of visual change and balance problems do not strongly suggest any circulatory problem, i.e. TIA but with recurrent episodes it is reasonable to exclude any brain lesion or injury. The best study is an MRI without contrast - which will be scheduled for you after Insurance approval. It is ok to take 1/2 or 1 xanax 30 minutes prior to the study.

## 2012-11-25 ENCOUNTER — Encounter: Payer: Self-pay | Admitting: Internal Medicine

## 2012-11-26 ENCOUNTER — Encounter: Payer: Self-pay | Admitting: Internal Medicine

## 2012-11-26 ENCOUNTER — Telehealth: Payer: Self-pay

## 2012-11-26 NOTE — Telephone Encounter (Signed)
Phone call from patient. She states you sent her a my chart message around 2 am that she can not open. I do not see this on her chart. She states in her last message to you she is having her MRI at [facility] tomorrow morning. She was wondering if you were replying to that message ? Please advise.

## 2012-11-26 NOTE — Telephone Encounter (Signed)
i responded to a [employer] about exercise last night. OK to do light exercise until MRI

## 2012-11-26 NOTE — Telephone Encounter (Signed)
Patient notified

## 2012-11-28 ENCOUNTER — Telehealth: Payer: Self-pay | Admitting: Internal Medicine

## 2012-11-28 NOTE — Telephone Encounter (Signed)
Called patient with normal results. 

## 2012-11-28 NOTE — Telephone Encounter (Signed)
Requesting results of MRI done yesterday, does not want to go all weekend with not knowing

## 2012-11-30 ENCOUNTER — Inpatient Hospital Stay: Admission: RE | Admit: 2012-11-30 | Payer: BC Managed Care – PPO | Source: Ambulatory Visit

## 2012-12-02 ENCOUNTER — Encounter: Payer: Self-pay | Admitting: Neurology

## 2012-12-03 ENCOUNTER — Ambulatory Visit (INDEPENDENT_AMBULATORY_CARE_PROVIDER_SITE_OTHER): Payer: BC Managed Care – PPO | Admitting: Neurology

## 2012-12-03 ENCOUNTER — Encounter: Payer: Self-pay | Admitting: Neurology

## 2012-12-03 VITALS — BP 114/65 | HR 72 | Ht 61.5 in | Wt 102.0 lb

## 2012-12-03 DIAGNOSIS — R55 Syncope and collapse: Secondary | ICD-10-CM

## 2012-12-03 DIAGNOSIS — G709 Myoneural disorder, unspecified: Secondary | ICD-10-CM

## 2012-12-03 DIAGNOSIS — R253 Fasciculation: Secondary | ICD-10-CM

## 2012-12-03 HISTORY — DX: Fasciculation: R25.3

## 2012-12-03 MED ORDER — ASPIRIN 81 MG PO CHEW: 81.0000 mg | CHEWABLE_TABLET | Freq: Every day | ORAL | Status: DC

## 2012-12-03 NOTE — Assessment & Plan Note (Signed)
Keep hydrated . Keep regular food intake, MRI was normal , June 2014 .

## 2012-12-03 NOTE — Progress Notes (Signed)
[facility]  Provider:  Dr Vickey Huger Referring Provider: Jacques Navy, MD Primary Care Physician:  Illene Regulus, MD  Chief Complaint  Patient presents with  . Neurologic Problem    RM#[id number]    HPI:  [name] is a 62 y.o. female here as a referral from Dr. Debby Bud for follow up on near syncope and benign fasciculation.   The patient reports that she had 2 spells of near-syncope one in May and one earlier in June of this year. She also has a history of migraines with aura, benign fasciculations which have affected also facial muscles, at times presenting like a tic. The benign fasciculations have been present for over 2 decades and are not related to a minor atrophic lateral sclerosis. Pulse of the near-syncope spells but the patient reported had a diplopia component and appeared to have been experienced as a vertical. The patient reports that the first spell was likely related to dehydration but is not quite sure about the second. The spells don't last long and if she takes fluid and fluid they quickly resolve and do not return. She was sent by her primary care physician for an MRI of the brain which returned normal. She has been seen by Dr. Dione Booze  for the brief dizziness, loss of vision , diplopia.    She is diagnosed of with macular degeneration early stage , myopia, and ocular migraines.  Vision was 20/25 in both eyes, ocular  pressure was 18 in the right, and 19 mm in the left eye and Dr. Dione Booze  reported that the patient is early cataract formation but can see well.  The optic nerves are healthy , but there is a macular drusen in the right eye he recommended ocular vitamins and to continue contact lens use the patient was otherwise found to be in good health ,and he did not impose any restrictions on driving activities of daily living etc. , The detailed report is reviewed here today as is her MRI report which was entirely normal.   I explained that migraines  can change to an AURA ONLY state with reaching the 5th decade of life. Many women that experienced severe nausea and headaches associated with menstrual cycles, has later in life the aura i without  headaches to follow . Is benign fasciculations there is truly not changed there is no treatment available nor is it necessary. Is important for the patient to stay hydrated and could not get hypoglycemic as these will lead to presyncopal spells as well as an aunt who took and frequency rise in fasciculations, and  asked her to restrict caffeine intake    Review of Systems: Out of a complete 14 system review, the patient complains of only the following symptoms, and all other reviewed systems are negative.  presyncope.  History   Social History  . Marital Status: Married    Spouse Name: N/A    Number of Children: 0  . Years of Education: 14   Occupational History  . [employer]   . [employer]    Social History Main Topics  . Smoking status: Never Smoker   . Smokeless tobacco: Never Used  . Alcohol Use: No  . Drug Use: No  . Sexually Active: Not on file   Other Topics Concern  . Not on file   Social History Narrative   [employer] in [place]. Married '90 - marriage is in very good shape '12, No children.  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 Offerred medical services for them if needed (Nov '11)             Family History  Problem Relation Age of Onset  . Hyperlipidemia Father   . COPD Father     Smoker  . Hyperlipidemia Mother   . Diabetes Neg Hx   . Colon cancer Neg Hx   . Breast cancer Neg Hx   . Coronary artery disease Neg Hx   . Cancer Neg Hx     breast or colon  . Hypertension Father   . Hypertension Mother     Past Medical History  Diagnosis Date  . Aortic valve disorders   . Mitral valve insufficiency and aortic valve insufficiency   . Other acute reactions to  stress   . Internal hemorrhoids without mention of complication   . Unspecified constipation   . Irritable bowel syndrome   . Hemorrhage of rectum and anus   . Microscopic hematuria   . Personal history of urinary calculi   . Other plastic surgery for unacceptable cosmetic appearance     Inj tx fllers/expander  . Esophageal reflux   . Postmenopausal atrophic vaginitis   . Backache, unspecified   . Allergic rhinitis, cause unspecified   . Conversion disorder   . Calculus of kidney   . Migraine headache with aura   . Benign fasciculation-cramp syndrome 12/03/2012     Worsening with  stress, fatigue .    Past Surgical History  Procedure Laterality Date  . Cosmetic procedures w/injection therapy    . Wisdom tooth extraction    . Breast biopsy Right     Current Outpatient Prescriptions  Medication Sig Dispense Refill  . ALREX 0.2 % SUSP Apply 0.2 drops to eye daily.      . Botulinum Toxin Type A, Cosm, (BOTOX COSMETIC) 50 UNITS SOLR Inject into the muscle. 3 month  injection      . cholecalciferol (VITAMIN D) 1000 UNITS tablet Take 1 tablet (1,000 Units total) by mouth daily.  100 tablet  3  . conjugated estrogens (PREMARIN) vaginal cream Place vaginally once a week. 1 g vag app weekly  42.5 g  1  . fluocinonide cream (LIDEX) 0.05 % Apply 1 application topically 2 (two) times daily.      [name] ibuprofen (ADVIL,MOTRIN) 200 MG tablet Take 200 mg by mouth as needed.        . pantoprazole (PROTONIX) 40 MG tablet Take 30-60 min before first meal of the day  30 tablet  11  . ranitidine (ZANTAC) 150 MG tablet Take 1 tablet (150 mg total) by mouth at bedtime.  90 tablet  3  . topiramate (TOPAMAX) 50 MG tablet Take 1 tablet (50 mg total) by mouth 2 (two) times daily.  60 tablet  5   No current facility-administered medications for this visit.    Allergies as of 12/03/2012 - Review Complete 12/03/2012  Allergen Reaction Noted  . Codeine Other (See Comments)   . Epinephrine  08/12/2012  .  Erythromycin Other (See Comments)   . Penicillins Other (See Comments)   . Sulfonamide derivatives Other (See Comments)     Vitals: BP 114/65  Pulse 72  Ht 5' 1.5" (1.562 m)  Wt 102 lb (46.267 kg)  BMI 18.96 kg/m2 Last Weight:  Wt Readings from Last 1 Encounters:  12/03/12 102 lb (46.267 kg)   Last Height:  Ht Readings from Last 1 Encounters:  12/03/12 5' 1.5" (1.562 m)   Vision Screening:  Left eye with correction 25/20.  Right eye with correction 25/20. Monovision, contacts.   Physical exam:  General: The patient is awake, alert and appears not in acute distress. The patient is well groomed. Head: Normocephalic, atraumatic. Neck is supple. Mallampati 2  neck circumference 13,  No retrognathia. Cardiovascular:  Regular rate and rhythm, without  murmurs or carotid bruit, and without distended neck veins. Respiratory: Lungs are clear to auscultation. Skin:  Without evidence of edema, or rash Trunk: BMI  normal .  Neurologic exam : The patient is awake and alert, oriented to place and time.  Memory subjective described as intact. There is a normal attention span & concentration ability. Speech is fluent without  dysarthria, dysphonia or aphasia. Mood and affect are appropriate.  Cranial nerves: Pupils are equal and briskly reactive to light. Funduscopic exam without  evidence of pallor or edema. Extraocular movements  in vertical and horizontal planes intact and without nystagmus. Visual fields by finger perimetry are intact. Hearing to finger rub intact.  Facial sensation intact to fine touch. Facial motor strength is symmetric and tongue and uvula move midline.  Motor exam: Normal tone and normal muscle bulk and symmetric normal strength in all extremities.  Sensory:  Fine touch, pinprick and vibration were tested in all extremities. Proprioception is tested in the upper extremities only. This was  normal.  Coordination: Rapid alternating movements in the fingers/hands is  tested and normal. Finger-to-nose maneuver tested and normal without evidence of ataxia, dysmetria or tremor.  Gait and station: Patient walks without assistive device and is able and assisted stool climb up to the exam table. Strength within normal limits. Stance is stable and normal. Tandem gait isunfragmented. Romberg testing normal.  Deep tendon reflexes: in the  upper and lower extremities are symmetric and intact.    Assessment:  After physical and neurologic examination, review of laboratory studies, imaging, neurophysiology testing and pre-existing records.  Benign fasciculations may also affect facial muscles as well at extraocular movements. There is no primary prevention for these fasciculations nor  secondary treatment, Avoiding caffeine and assuring sufficient hydration I recommended. A patient with visual aura migraines and the associated Stroke risk were discussed : , there is an increased risk . However the highest risk of strokes and migraine her is the fact women that are overweight on hormone replacement therapy or smoking. ASA every other day is safe and a good compromise.  I evaluated the MRI, e- lytes and exam - and determined   that the patient's visual changes during her presyncopal spell are not a TIA. The patient did not develop loss of the visual fields but a diplopia and irregular eye movements, and this would manifest as vertigo . She does have instructions for her positional exercises in the treatment of benign positional vertigo.  Plan:  Treatment plan and additional workup will be reviewed under Problem List.

## 2012-12-03 NOTE — Patient Instructions (Signed)
Vertigo Vertigo means you feel like you or your surroundings are moving when they are not. Vertigo can be dangerous if it occurs when you are at work, driving, or performing difficult activities.  CAUSES  Vertigo occurs when there is a conflict of signals sent to your brain from the visual and sensory systems in your body. There are many different causes of vertigo, including:  Infections, especially in the inner ear.  A bad reaction to a drug or misuse of alcohol and medicines.  Withdrawal from drugs or alcohol.  Rapidly changing positions, such as lying down or rolling over in bed.  A migraine headache.  Decreased blood flow to the brain.  Increased pressure in the brain from a head injury, infection, tumor, or bleeding. SYMPTOMS  You may feel as though the world is spinning around or you are falling to the ground. Because your balance is upset, vertigo can cause nausea and vomiting. You may have involuntary eye movements (nystagmus). DIAGNOSIS  Vertigo is usually diagnosed by physical exam. If the cause of your vertigo is unknown, your caregiver may perform imaging tests, such as an MRI scan (magnetic resonance imaging). TREATMENT  Most cases of vertigo resolve on their own, without treatment. Depending on the cause, your caregiver may prescribe certain medicines. If your vertigo is related to body position issues, your caregiver may recommend movements or procedures to correct the problem. In rare cases, if your vertigo is caused by certain inner ear problems, you may need surgery. HOME CARE INSTRUCTIONS   Follow your caregiver's instructions.  Avoid driving.  Avoid operating heavy machinery.  Avoid performing any tasks that would be dangerous to you or others during a vertigo episode.  Tell your caregiver if you notice that certain medicines seem to be causing your vertigo. Some of the medicines used to treat vertigo episodes can actually make them worse in some people. SEEK  IMMEDIATE MEDICAL CARE IF:   Your medicines do not relieve your vertigo or are making it worse.  You develop problems with talking, walking, weakness, or using your arms, hands, or legs.  You develop severe headaches.  Your nausea or vomiting continues or gets worse.  You develop visual changes.  A family member notices behavioral changes.  Your condition gets worse. MAKE SURE YOU:  Understand these instructions.  Will watch your condition.  Will get help right away if you are not doing well or get worse. Document Released: 03/14/2005 Document Revised: 08/27/2011 Document Reviewed: 12/21/2010 Exit[employer] Patient Information 2014 [employer].  

## 2012-12-18 ENCOUNTER — Encounter: Payer: Self-pay | Admitting: Neurology

## 2012-12-18 DIAGNOSIS — Z0289 Encounter for other administrative examinations: Secondary | ICD-10-CM

## 2012-12-23 ENCOUNTER — Telehealth: Payer: Self-pay | Admitting: Neurology

## 2012-12-23 NOTE — Telephone Encounter (Signed)
Spoke to patient , explained a possible  Botox reaction  (Diplopia )would be expected within the first 2-3 weeks , not in the third month , before due for the next shot.  She understood.

## 2013-01-16 ENCOUNTER — Encounter: Payer: Self-pay | Admitting: Internal Medicine

## 2013-01-21 ENCOUNTER — Other Ambulatory Visit: Payer: Self-pay | Admitting: Internal Medicine

## 2013-01-21 MED ORDER — PANTOPRAZOLE SODIUM 40 MG PO TBEC: DELAYED_RELEASE_TABLET | ORAL | Status: DC

## 2013-01-22 ENCOUNTER — Other Ambulatory Visit: Payer: Self-pay | Admitting: *Deleted

## 2013-01-22 MED ORDER — PANTOPRAZOLE SODIUM 40 MG PO TBEC: DELAYED_RELEASE_TABLET | ORAL | Status: DC

## 2013-03-26 ENCOUNTER — Telehealth: Payer: Self-pay

## 2013-03-26 NOTE — Telephone Encounter (Signed)
Left voice mail that it is okay to restart Ranitidine and to call back if she has any questions.

## 2013-03-26 NOTE — Telephone Encounter (Signed)
Phone call from patient stating back in March 2014 she had episodes of reflux and coughing and at times hard to breath. She was placed on Pantoprazole 40 mg QD and Ranitidine 150 mg QHS. When she started feeling better she discontinued taking Ranitidine but still takes Pantoprazole,. She starting to have the same symptoms again and asks is it okay to start the Ranitidine again? Please advise.

## 2013-03-26 NOTE — Telephone Encounter (Signed)
Yes. Please restart ranitidine

## 2013-04-01 ENCOUNTER — Encounter: Payer: Self-pay | Admitting: Internal Medicine

## 2013-04-02 ENCOUNTER — Telehealth: Payer: Self-pay | Admitting: Neurology

## 2013-04-03 NOTE — Telephone Encounter (Signed)
Very likely benign fasciculations - they have affected different parts of body and face . Not to worry. EMG and NCS gave no indication of a motoneuron disease.

## 2013-04-03 NOTE — Telephone Encounter (Signed)
I called pt.  She has experienced for the last 2 wks (transient) R inner thigh, having several seconds at times of a fluttering, vibration feeling.  Nothing observed, just feeling.  She wanted to runt his by you.  (she does have benign fasiculations) noted from your last note. Please advise.  Thanks  [phone]).  No changes in medications.

## 2013-04-06 ENCOUNTER — Telehealth: Payer: Self-pay | Admitting: Neurology

## 2013-04-06 NOTE — Telephone Encounter (Signed)
Relayed message below.  Pt is to return call if needed.

## 2013-04-06 NOTE — Telephone Encounter (Signed)
Yes , it should be fine. Please call.

## 2013-04-06 NOTE — Telephone Encounter (Signed)
Relayed ok via VM that ok to have flu shot.

## 2013-04-08 ENCOUNTER — Encounter: Payer: Self-pay | Admitting: Internal Medicine

## 2013-04-08 ENCOUNTER — Ambulatory Visit (INDEPENDENT_AMBULATORY_CARE_PROVIDER_SITE_OTHER): Payer: BC Managed Care – PPO | Admitting: Internal Medicine

## 2013-04-08 ENCOUNTER — Ambulatory Visit (INDEPENDENT_AMBULATORY_CARE_PROVIDER_SITE_OTHER): Payer: BC Managed Care – PPO

## 2013-04-08 VITALS — BP 142/80 | HR 82 | Temp 98.1°F | Wt 103.0 lb

## 2013-04-08 DIAGNOSIS — M25519 Pain in unspecified shoulder: Secondary | ICD-10-CM

## 2013-04-08 DIAGNOSIS — Z23 Encounter for immunization: Secondary | ICD-10-CM

## 2013-04-08 DIAGNOSIS — M25511 Pain in right shoulder: Secondary | ICD-10-CM
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
## 2013-04-08 NOTE — Patient Instructions (Signed)
Shoulder pain - on exam there is good passive and active range of motion of the right shoulder. There is point tenderness at the right trapezius group.  Plan Ice the area for the next several hours  After the initial period of time heat  Lineament of choice: aspercreme, BenGay etc  Range of motion exercise at least twice a day   Medications - per ePocrates no muscle twitch listed as side effect for pantoprazole.

## 2013-04-13 ENCOUNTER — Other Ambulatory Visit: Payer: Self-pay | Admitting: Neurology

## 2013-04-13 DIAGNOSIS — R253 Fasciculation: Secondary | ICD-10-CM

## 2013-04-22 ENCOUNTER — Ambulatory Visit (INDEPENDENT_AMBULATORY_CARE_PROVIDER_SITE_OTHER): Payer: BC Managed Care – PPO | Admitting: Neurology

## 2013-04-22 ENCOUNTER — Encounter: Payer: Self-pay | Admitting: Neurology

## 2013-04-22 ENCOUNTER — Encounter (INDEPENDENT_AMBULATORY_CARE_PROVIDER_SITE_OTHER): Payer: BC Managed Care – PPO | Admitting: Radiology

## 2013-04-22 ENCOUNTER — Telehealth: Payer: Self-pay | Admitting: Neurology

## 2013-04-22 DIAGNOSIS — M62838 Other muscle spasm: Secondary | ICD-10-CM

## 2013-04-22 DIAGNOSIS — R253 Fasciculation: Secondary | ICD-10-CM

## 2013-04-22 DIAGNOSIS — Z0289 Encounter for other administrative examinations: Secondary | ICD-10-CM

## 2013-04-22 NOTE — Telephone Encounter (Signed)
Called patient w/ Dr Dohmeier's findings of normal EMG results, lt VM message

## 2013-04-22 NOTE — Procedures (Signed)
  HISTORY:  [name] is a 62 year old patient with a ten-year history of muscle twitches that may affect the legs, arms, or face. The patient reports no numbness, pain, or weakness. The patient is being evaluated for the fasciculations.  NERVE CONDUCTION STUDIES:  Nerve conduction studies were performed on the right upper extremity. The distal motor latencies and motor amplitudes for the median and ulnar nerves were within normal limits. The F wave latencies and nerve conduction velocities for these nerves were also normal. The sensory latencies for the median and ulnar nerves were normal.  Nerve conduction studies were performed on the right lower extremity. The distal motor latencies and motor amplitudes for the peroneal and posterior tibial nerves were within normal limits. The nerve conduction velocities for these nerves were also normal. The F wave latencies were normal for the peroneal and posterior tibial nerves. The sensory latencies for the peroneal nerves were within normal limits.   EMG STUDIES:  EMG study was performed on the right upper extremity:  The first dorsal interosseous muscle reveals 2 to 4 K units with full recruitment. No fibrillations or positive waves were noted. The abductor pollicis brevis muscle reveals 2 to 4 K units with full recruitment. No fibrillations or positive waves were noted. The extensor indicis proprius muscle reveals 1 to 3 K units with full recruitment. No fibrillations or positive waves were noted. The biceps muscle reveals 1 to 2 K units with full recruitment. No fibrillations or positive waves were noted. The triceps muscle reveals 2 to 4 K units with full recruitment. No fibrillations or positive waves were noted. The anterior deltoid muscle reveals 2 to 3 K units with full recruitment. No fibrillations or positive waves were noted. The cervical paraspinal muscles were not tested as per the patient's request.  EMG study was performed on  the right lower extremity:  The tibialis anterior muscle reveals 2 to 4K motor units with full recruitment. No fibrillations or positive waves were seen. The peroneus tertius muscle reveals 2 to 4K motor units with full recruitment. No fibrillations or positive waves were seen. The medial gastrocnemius muscle reveals 1 to 3K motor units with full recruitment. No fibrillations or positive waves were seen. The vastus lateralis muscle reveals 2 to 4K motor units with full recruitment. No fibrillations or positive waves were seen. The iliopsoas muscle reveals 2 to 4K motor units with full recruitment. No fibrillations or positive waves were seen. The lumbosacral paraspinal muscles were tested at 3 levels, and revealed no abnormalities of insertional activity at all 3 levels tested. There was good relaxation.   IMPRESSION:  Nerve conduction studies of the right upper and lower extremities were unremarkable, without evidence of a neuropathy. EMG evaluation of the right upper and right lower extremities were unremarkable. No fasciculations are seen. No evidence of a radiculopathy or evidence of an anterior horn cell disease process was seen.  Marlan Palau MD 04/22/2013 11:26 AM  [facility] [address] [place], [place] [address]  Phone [phone] Fax [phone]

## 2013-04-22 NOTE — Telephone Encounter (Signed)
Please let Mrs <cCulley know her EMG was normal,that  there was no evidence of ALS !!

## 2013-05-05 ENCOUNTER — Ambulatory Visit: Payer: BC Managed Care – PPO | Admitting: Internal Medicine

## 2013-05-06 ENCOUNTER — Other Ambulatory Visit: Payer: Self-pay | Admitting: Dermatology

## 2013-05-06 ENCOUNTER — Encounter: Payer: Self-pay | Admitting: Internal Medicine

## 2013-05-06 ENCOUNTER — Ambulatory Visit (INDEPENDENT_AMBULATORY_CARE_PROVIDER_SITE_OTHER): Payer: BC Managed Care – PPO | Admitting: Internal Medicine

## 2013-05-06 VITALS — BP 120/70 | HR 73 | Temp 98.4°F | Wt 106.8 lb

## 2013-05-06 DIAGNOSIS — N390 Urinary tract infection, site not specified: Secondary | ICD-10-CM

## 2013-05-06 DIAGNOSIS — S335XXA Sprain of ligaments of lumbar spine, initial encounter: Secondary | ICD-10-CM

## 2013-05-06 DIAGNOSIS — M545 Low back pain, unspecified: Secondary | ICD-10-CM

## 2013-05-06 LAB — POCT URINALYSIS DIPSTICK
Bilirubin, UA: NEGATIVE
Glucose, UA: NEGATIVE
Ketones, UA: NEGATIVE
Spec Grav, UA: >=1.03

## 2013-05-06 MED ORDER — CIPROFLOXACIN HCL 250 MG PO TABS: 250.0000 mg | ORAL_TABLET | Freq: Two times a day (BID) | ORAL | Status: DC

## 2013-05-06 NOTE — Patient Instructions (Signed)
1. Back pain - exam is negative for any disk injury or pinched nerve. Most likely minor muscle strain. Plan Ibuprofen is fine  Stretch and flex exercise  Heat - chemical heat patches work great.  2. UTI - positive dip U/A Plan  cipro 250 mg twice x 5 days.

## 2013-05-06 NOTE — Progress Notes (Signed)
Pre visit review using our clinic review tool, if applicable. No additional management support is needed unless otherwise documented below in the visit note. 

## 2013-05-06 NOTE — Progress Notes (Signed)
  Subjective:    Patient ID: [name], female    DOB: November 24, 1950, 62 y.o.   MRN: [id number]  HPI [name] has had an EMG/NCS to evaluate fasciculations - negative study.  She presents today for 2 weeks h/o back pain. Low back more on the right. She has been taking Ibuprofen and doing exercises but has persistent pain. Of note she has a positive dip U/A- moderate leukocytesterase.   PMH, FamHx and SocHx reviewed for any changes and relevance. Current Outpatient Prescriptions on File Prior to Visit  Medication Sig Dispense Refill  . ALREX 0.2 % SUSP Apply 0.2 drops to eye as needed.       . Botulinum Toxin Type A, Cosm, (BOTOX COSMETIC) 50 UNITS SOLR Inject 4-6 Units into the muscle. 3 month  injection      . conjugated estrogens (PREMARIN) vaginal cream Place vaginally once a week. 1 g vag app weekly  42.5 g  1  . fluocinonide cream (LIDEX) 0.05 % Apply 1 application topically 2 (two) times daily as needed.       [name] ibuprofen (ADVIL,MOTRIN) 200 MG tablet Take 200 mg by mouth as needed.        . pantoprazole (PROTONIX) 40 MG tablet Take 30-60 min before first meal of the day  90 tablet  1  . ranitidine (ZANTAC) 150 MG tablet Take 150 mg by mouth as needed.      [name] aspirin (ASPIRIN CHILDRENS) 81 MG chewable tablet Chew 1 tablet (81 mg total) by mouth daily.  30 tablet  5  . cholecalciferol (VITAMIN D) 1000 UNITS tablet Take 1 tablet (1,000 Units total) by mouth daily.  100 tablet  3   No current facility-administered medications on file prior to visit.      Review of Systems System review is negative for any constitutional, cardiac, pulmonary, GI or neuro symptoms or complaints other than as described in the HPI.      Objective:   Physical Exam Filed Vitals:   05/06/13 1020  BP: 120/70  Pulse: 73  Temp: 98.4 F (36.9 C)   gen'l- WNWD woman in no distress HEENT- normal MSK - Back exam: normal stand; normal flex to greater than 100 degrees; normal gait; normal toe/heel  walk; normal step up to exam table; normal SLR sitting; normal DTRs at the patellar tendons; normal sensation to light touch, pin-prick and deep vibratory stimulus; no  CVA tenderness; able to move supine to sitting witout assistance.  Dip U/A positive for Leuk        Assessment & Plan:  UTI - mildly symptomatic  Plan cipro 250 mg bid x 5

## 2013-05-07 NOTE — Assessment & Plan Note (Signed)
Recurrent mild back stain with low back pain.  Plan NSAID  Heat  Flex/stretch.

## 2013-05-11 ENCOUNTER — Telehealth: Payer: Self-pay | Admitting: *Deleted

## 2013-05-11 ENCOUNTER — Other Ambulatory Visit: Payer: BC Managed Care – PPO

## 2013-05-11 DIAGNOSIS — R109 Unspecified abdominal pain: Secondary | ICD-10-CM

## 2013-05-11 LAB — URINALYSIS, ROUTINE W REFLEX MICROSCOPIC
Bilirubin Urine: NEGATIVE
Ketones, ur: NEGATIVE
Leukocytes, UA: NEGATIVE
Specific Gravity, Urine: 1.01 (ref 1.000–1.030)
Total Protein, Urine: NEGATIVE
pH: 6 (ref 5.0–8.0)

## 2013-05-11 NOTE — Telephone Encounter (Signed)
May come by for u/a to r/o blood in the urine. If positive can see her and then order CT if indicated.

## 2013-05-11 NOTE — Telephone Encounter (Signed)
Spoke with pt advised of MDs message 

## 2013-05-11 NOTE — Telephone Encounter (Signed)
Pt called states she has completed the antibiotics but is complaining of lower left side pain.  States she has a history of Kidney stones and is concerned.  Please advise

## 2013-05-21 ENCOUNTER — Encounter: Payer: Self-pay | Admitting: Family Medicine

## 2013-05-21 ENCOUNTER — Other Ambulatory Visit (INDEPENDENT_AMBULATORY_CARE_PROVIDER_SITE_OTHER): Payer: BC Managed Care – PPO

## 2013-05-21 ENCOUNTER — Ambulatory Visit (INDEPENDENT_AMBULATORY_CARE_PROVIDER_SITE_OTHER): Payer: BC Managed Care – PPO | Admitting: Family Medicine

## 2013-05-21 ENCOUNTER — Ambulatory Visit: Payer: BC Managed Care – PPO | Admitting: Family Medicine

## 2013-05-21 VITALS — BP 120/78 | HR 75 | Ht 62.0 in | Wt 106.0 lb

## 2013-05-21 DIAGNOSIS — R1032 Left lower quadrant pain: Secondary | ICD-10-CM

## 2013-05-21 DIAGNOSIS — IMO0002 Reserved for concepts with insufficient information to code with codable children: Secondary | ICD-10-CM

## 2013-05-21 DIAGNOSIS — S76212A Strain of adductor muscle, fascia and tendon of left thigh, initial encounter: Secondary | ICD-10-CM | POA: Insufficient documentation

## 2013-05-21 MED ORDER — MELOXICAM 15 MG PO TABS: 15.0000 mg | ORAL_TABLET | Freq: Every day | ORAL | Status: DC

## 2013-05-21 NOTE — Progress Notes (Signed)
Pre-visit discussion using our clinic review tool. No additional management support is needed unless otherwise documented below in the visit note.  

## 2013-05-21 NOTE — Assessment & Plan Note (Signed)
Patient has what appears to be more of a hip adductor strain. Patient likely is having back pain because she is compensating with tight hip flexors. Patient given home exercise program, meloxicam daily for 5 days then as needed, icing protocol, discussed compression shorts as well. Patient will try these interventions and come back and see me again in 3-6 weeks. Depending on what we find will either consider further imaging versus corticosteroid injection for diagnostic and therapeutic purposes. Unable to do nitroglycerin patch secondary to history of migraines.

## 2013-05-21 NOTE — Progress Notes (Signed)
I'm seeing this patient by the request  of:  Illene Regulus, MD   CC: Low back pain left groin pain  HPI: Patient is a pleasant 62 year old female who is coming in with a 6 week history of low back pain. Patient had been trying anti-inflammatories as well as doing some exercises but continued to have persistent pain of the low back. Patient does not remember any true injury to area. Patient states actually it seems that the left groin seems to be the most pain at this time. Patient states she notices it with long sitting as well as laying down. Patient denies any significant worsening pain when she is running. Patient does work with a Psychologist, educational one time a week and states that when she was doing lunges she did have some discomfort in this area. Patient denies any feelings of giving out on her, any numbness, or any tingling. Patient describes the pain as more of a tightness in her back and more of a soreness in the groin. Patient has tried some ibuprofen which does moderately help. Patient did have a workup recently for a benign fasciculation cramp syndrome. Patient had an EMG which was unremarkable. Patient with the severity pain 6/10 because it is affecting her daily activities.   Past medical, surgical, family and social history reviewed. Medications reviewed all in the electronic medical record.   Review of Systems: No headache, visual changes, nausea, vomiting, diarrhea, constipation, dizziness, abdominal pain, skin rash, fevers, chills, night sweats, weight loss, swollen lymph nodes, body aches, joint swelling, muscle aches, chest pain, shortness of breath, mood changes.   Objective:    Blood pressure 120/78, pulse 75, height 5\' 2"  (1.575 m), weight 106 lb (48.081 kg), SpO2 98.00%.   General: No apparent distress alert and oriented x3 mood and affect normal, dressed appropriately.  HEENT: Pupils equal, extraocular movements intact Respiratory: Patient's speak in full sentences and does not  appear short of breath Cardiovascular: No lower extremity edema, non tender, no erythema Skin: Warm dry intact with no signs of infection or rash on extremities or on axial skeleton. Abdomen: Soft nontender Neuro: Cranial nerves II through XII are intact, neurovascularly intact in all extremities with 2+ DTRs and 2+ pulses. Lymph: No lymphadenopathy of posterior or anterior cervical chain or axillae bilaterally.  Gait normal with good balance and coordination.  MSK: Non tender with full range of motion and good stability and symmetric strength and tone of shoulders, elbows, wrist, hip, knee and ankles bilaterally.  Back Exam:  Inspection: Unremarkable  Motion: Flexion 45 deg, Extension 45 deg, Side Bending to 45 deg bilaterally,  Rotation to 45 deg bilaterally  SLR laying: Negative  XSLR laying: Negative  Palpable tenderness: None. FABER: negative. Sensory change: Gross sensation intact to all lumbar and sacral dermatomes.  Reflexes: 2+ at both patellar tendons, 2+ at achilles tendons, Babinski's downgoing.  Strength at foot  Plantar-flexion: 5/5 Dorsi-flexion: 5/5 Eversion: 5/5 Inversion: 5/5  Leg strength  Quad: 5/5 Hamstring: 5/5 Hip flexor: 5/5 Hip abductors: 4/5 on the left compared to right She is also tender to palpation over the inguinal area on the left. With Valsalva and no masses palpated. Gait unremarkable.  MSK US performed of: Left hip This study was ordered, performed, and interpreted by Terrilee Files D.O.  Hip: Trochanteric bursa without swelling or effusion. Acetabular labrum visualized and without tears, displacement, or effusion in joint. Femoral neck appears unremarkable without increased power doppler signal along Cortex. Patient though at the origin of patient's  hip adductor's season area of hypoechoic changes. Patient does have what appears to be a tear that is approximately 2.3 cm in diameter. There is some mild increased Doppler flow. Does not appear to be  herniation. With Valsalva no aneurysm or hernia present.  IMPRESSION:  Hip adductor strain   Impression and Recommendations:     This case required medical decision making of moderate complexity.

## 2013-05-21 NOTE — Patient Instructions (Signed)
Great to meet you Try ibuprofen 600mg  3 times a day for 5 days.  Stop if it hurts your sotmach Tumeric 500mg  tabs twice daily.  Fish oil 3 grams daily.  Vitamin D 1000-2000 IU daily.  Exercises daily Ice 20 minutes after activity Compression shorts with activity and 30 minutes afterward.  Avoid lateral activity with exercise for 2 weeks  Come back in 4 weeks after the new year.

## 2013-05-31 ENCOUNTER — Other Ambulatory Visit: Payer: Self-pay | Admitting: Internal Medicine

## 2013-06-01 ENCOUNTER — Encounter: Payer: Self-pay | Admitting: Internal Medicine

## 2013-06-01 ENCOUNTER — Ambulatory Visit (INDEPENDENT_AMBULATORY_CARE_PROVIDER_SITE_OTHER): Payer: BC Managed Care – PPO | Admitting: Internal Medicine

## 2013-06-01 VITALS — BP 100/62 | HR 76 | Ht 61.5 in | Wt 104.5 lb

## 2013-06-01 DIAGNOSIS — K625 Hemorrhage of anus and rectum: Secondary | ICD-10-CM

## 2013-06-01 DIAGNOSIS — K219 Gastro-esophageal reflux disease without esophagitis: Secondary | ICD-10-CM

## 2013-06-01 DIAGNOSIS — K589 Irritable bowel syndrome without diarrhea: Secondary | ICD-10-CM

## 2013-06-01 DIAGNOSIS — Z1211 Encounter for screening for malignant neoplasm of colon: Secondary | ICD-10-CM

## 2013-06-01 DIAGNOSIS — K649 Unspecified hemorrhoids: Secondary | ICD-10-CM

## 2013-06-01 MED ORDER — MOVIPREP 100 G PO SOLR: 1.0000 | Freq: Once | ORAL | Status: DC

## 2013-06-01 NOTE — Progress Notes (Signed)
HISTORY OF PRESENT ILLNESS:  [name] is a 62 y.o. female with the below listed medical history who has been seen in this office previously for GERD and IBS. She presents today regarding new onset rectal bleeding and lower abdominal discomfort. She Tells me that she has had recent medical evaluations for benign fasciculations, groin and low back discomfort, and urinary tract infection. Last month she reports an episode of bright red blood per rectum. No associated rectal discomfort. Similar problem 10 days later. She shows me a picture, on her cell phone, of red blood in the toilet bowl and tissue. She has had some lower abdominal discomfort. Bowel habits continued to fluctuate as they have chronically. Weight has been stable. Last colonoscopy about 10 years ago. Did receive recall letter recently. She is concerned. She does report hemorrhoids occasionally protrude or retaine moisture. Last colonoscopy May 2005. Last EGD January 2000. Both normal  REVIEW OF SYSTEMS:  All non-GI ROS negative except for  Past Medical History  Diagnosis Date  . Aortic valve disorders   . Mitral valve insufficiency and aortic valve insufficiency   . Other acute reactions to stress   . Internal hemorrhoids without mention of complication   . Unspecified constipation   . Irritable bowel syndrome   . Hemorrhage of rectum and anus   . Microscopic hematuria   . Personal history of urinary calculi   . Other plastic surgery for unacceptable cosmetic appearance     Inj tx fllers/expander  . Esophageal reflux   . Postmenopausal atrophic vaginitis   . Backache, unspecified   . Allergic rhinitis, cause unspecified   . Conversion disorder   . Calculus of kidney   . Migraine headache with aura   . Benign fasciculation-cramp syndrome 12/03/2012     Worsening with  stress, fatigue .    Past Surgical History  Procedure Laterality Date  . Cosmetic procedures w/injection therapy    . Wisdom tooth extraction     . Breast biopsy Right     Social History [name]  reports that she has never smoked. She has never used smokeless tobacco. She reports that she does not drink alcohol or use illicit drugs.  family history includes COPD in her father; Hyperlipidemia in her father and mother; Hypertension in her father and mother. There is no history of Diabetes, Colon cancer, Breast cancer, Coronary artery disease, or Cancer.  Allergies  Allergen Reactions  . Codeine Other (See Comments)    Doesn't remeber  . Epinephrine     Heart racing   . Erythromycin Other (See Comments)    Doesn't remember   . Penicillins Other (See Comments)    Doesn't remember   . Sulfonamide Derivatives Other (See Comments)    Doesn't remember        PHYSICAL EXAMINATION: Vital signs: BP 100/62  Pulse 76  Ht 5' 1.5" (1.562 m)  Wt 104 lb 8 oz (47.401 kg)  BMI 19.43 kg/m2 General: Well-developed, well-nourished, no acute distress HEENT: Sclerae are anicteric, conjunctiva pink. Oral mucosa intact Lungs: Clear Heart: Regular Abdomen: soft, mild tenderness with palpation throughout, nondistended, no obvious ascites, no peritoneal signs, normal bowel sounds. No organomegaly. Rectal: Deferred until colonoscopy Extremities: No edema Psychiatric: alert and oriented x3. Cooperative   ASSESSMENT:  #1. New onset rectal bleeding. Likely due to hemorrhoids #2. Colon cancer screening. Last colonoscopy 10 years ago. Due for followup #3. History of IBS. Ongoing #4. GERD. Controlled with PPI   PLAN:  #1. Colonoscopy to  provide colon cancer screening. Additionally, we can assess issues with minor bleeding as well.The nature of the procedure, as well as the risks, benefits, and alternatives were carefully and thoroughly reviewed with the patient. Ample time for discussion and questions allowed. The patient understood, was satisfied, and agreed to proceed. #2. Movi prep prescribed. Patient instructed on its use #3.  Fiber supplement #4. Continue reflux precautions and PPI

## 2013-06-01 NOTE — Patient Instructions (Signed)

## 2013-06-02 ENCOUNTER — Telehealth: Payer: Self-pay | Admitting: Internal Medicine

## 2013-06-02 NOTE — Telephone Encounter (Signed)
[name] is scheduled this week for a colonoscopy.  She want to know if if Dr. Debby Bud agrees with having this.  She still is having stomach discomfort.  Should she schedule a CT?

## 2013-06-02 NOTE — Telephone Encounter (Signed)
Pt is aware.  

## 2013-06-02 NOTE — Telephone Encounter (Signed)
Definitely should go ahead with colon

## 2013-06-03 ENCOUNTER — Ambulatory Visit: Payer: BC Managed Care – PPO | Admitting: Cardiology

## 2013-06-04 ENCOUNTER — Encounter: Payer: Self-pay | Admitting: Internal Medicine

## 2013-06-04 ENCOUNTER — Ambulatory Visit (AMBULATORY_SURGERY_CENTER): Payer: BC Managed Care – PPO | Admitting: Internal Medicine

## 2013-06-04 VITALS — BP 132/67 | HR 65 | Temp 98.0°F | Resp 28 | Ht 61.5 in | Wt 104.0 lb

## 2013-06-04 DIAGNOSIS — K625 Hemorrhage of anus and rectum: Secondary | ICD-10-CM

## 2013-06-04 DIAGNOSIS — Z1211 Encounter for screening for malignant neoplasm of colon: Secondary | ICD-10-CM

## 2013-06-04 MED ORDER — HYDROCORTISONE ACETATE 25 MG RE SUPP: 25.0000 mg | Freq: Every evening | RECTAL | Status: DC | PRN

## 2013-06-04 MED ORDER — SODIUM CHLORIDE 0.9 % IV SOLN: 500.0000 mL | INTRAVENOUS | Status: DC

## 2013-06-04 NOTE — Patient Instructions (Signed)
YOU HAD AN ENDOSCOPIC PROCEDURE TODAY AT THE [facility]: Refer to the procedure report that was given to you for any specific questions about what was found during the examination.  If the procedure report does not answer your questions, please call your gastroenterologist to clarify.  If you requested that your care partner not be given the details of your procedure findings, then the procedure report has been included in a sealed envelope for you to review at your convenience later.  YOU SHOULD EXPECT: Some feelings of bloating in the abdomen. Passage of more gas than usual.  Walking can help get rid of the air that was put into your GI tract during the procedure and reduce the bloating. If you had a lower endoscopy (such as a colonoscopy or flexible sigmoidoscopy) you may notice spotting of blood in your stool or on the toilet paper. If you underwent a bowel prep for your procedure, then you may not have a normal bowel movement for a few days.  DIET: Your first meal following the procedure should be a light meal and then it is ok to progress to your normal diet.  A half-sandwich or bowl of soup is an example of a good first meal.  Heavy or fried foods are harder to digest and may make you feel nauseous or bloated.  Likewise meals heavy in dairy and vegetables can cause extra gas to form and this can also increase the bloating.  Drink plenty of fluids but you should avoid alcoholic beverages for 24 hours.  ACTIVITY: Your care partner should take you home directly after the procedure.  You should plan to take it easy, moving slowly for the rest of the day.  You can resume normal activity the day after the procedure however you should NOT DRIVE or use heavy machinery for 24 hours (because of the sedation medicines used during the test).    SYMPTOMS TO REPORT IMMEDIATELY: A gastroenterologist can be reached at any hour.  During normal business hours, 8:30 AM to 5:00 PM Monday through Friday,  call [phone].  After hours and on weekends, please call the GI answering service at [phone] who will take a message and have the physician on call contact you.   Following lower endoscopy (colonoscopy or flexible sigmoidoscopy):  Excessive amounts of blood in the stool  Significant tenderness or worsening of abdominal pains  Swelling of the abdomen that is new, acute  Fever of 100F or higher    FOLLOW UP: If any biopsies were taken you will be contacted by phone or by letter within the next 1-3 weeks.  Call your gastroenterologist if you have not heard about the biopsies in 3 weeks.  Our staff will call the home number listed on your records the next business day following your procedure to check on you and address any questions or concerns that you may have at that time regarding the information given to you following your procedure. This is a courtesy call and so if there is no answer at the home number and we have not heard from you through the emergency physician on call, we will assume that you have returned to your regular daily activities without incident.  SIGNATURES/CONFIDENTIALITY: You and/or your care partner have signed paperwork which will be entered into your electronic medical record.  These signatures attest to the fact that that the information above on your After Visit Summary has been reviewed and is understood.  Full responsibility of the confidentiality   of this discharge information lies with you and/or your care-partner.    anusol HC suppository one every night per rectum as needed for hemorrhoids

## 2013-06-04 NOTE — Op Note (Signed)
[facility] [address] [place], [address]   COLONOSCOPY PROCEDURE REPORT  PATIENT: [name], [name]  MR#: [id number] BIRTHDATE: 12-21-1950 , 62  yrs. old GENDER: Female ENDOSCOPIST: Roxy Cedar, MD REFERRED XB:[id number] Recall, PROCEDURE DATE:  06/04/2013 PROCEDURE:   Colonoscopy, screening First Screening Colonoscopy - Avg.  risk and is 50 yrs.  old or older - No.  Prior Negative Screening - Now for repeat screening. 10 or more years since last screening  History of Adenoma - Now for follow-up colonoscopy & has been > or = to 3 yrs.  N/A  Polyps Removed Today? No.  Recommend repeat exam, <10 yrs? No. ASA CLASS:   Class II INDICATIONS:average risk screening. MEDICATIONS: MAC sedation, administered by CRNA and propofol (Diprivan) 350mg  IV  DESCRIPTION OF PROCEDURE:   After the risks benefits and alternatives of the procedure were thoroughly explained, informed consent was obtained.  A digital rectal exam revealed no abnormalities of the rectum.   The LB [id number]  endoscope was introduced through the anus and advanced to the cecum, which was identified by both the appendix and ileocecal valve. No adverse events experienced.   The quality of the prep was excellent, using MoviPrep  The instrument was then slowly withdrawn as the colon was fully examined.      COLON FINDINGS: A normal appearing cecum, ileocecal valve, and appendiceal orifice were identified.  The ascending, hepatic flexure, transverse, splenic flexure, descending, sigmoid colon and rectum appeared unremarkable.  No polyps or cancers were seen. Retroflexed views revealed internal hemorrhoids. The time to cecum=3 minutes 12 seconds.  Withdrawal time=9 minutes 37 seconds. The scope was withdrawn and the procedure completed.  COMPLICATIONS: There were no complications.  ENDOSCOPIC IMPRESSION: 1. Normal colon  RECOMMENDATIONS: 1.  Prescribe Anusol HC supp.; #30; one PR qhs   prn  (3 refills) 2.  Continue current colorectal screening recommendations for "routine risk" patients with a repeat colonoscopy in 10 years.   eSigned:  Roxy Cedar, MD 06/04/2013 12:21 PM   cc: Jacques Navy, MD and The Patient

## 2013-06-04 NOTE — Progress Notes (Signed)
Patient did not experience any of the following events: a burn prior to discharge; a fall within the facility; wrong site/side/patient/procedure/implant event; or a hospital transfer or hospital admission upon discharge from the facility. (G8907) Patient with preoperative order for IV antibiotic SSI prophylaxis, antibiotic initiated on time. (G8916)  

## 2013-06-05 ENCOUNTER — Telehealth: Payer: Self-pay

## 2013-06-05 NOTE — Telephone Encounter (Signed)
  Follow up Call-  Call back number 06/04/2013  Post procedure Call Back phone  # [phone]  Permission to leave phone message Yes     Patient questions:  Do you have a fever, pain , or abdominal swelling? no Pain Score  0 *  Have you tolerated food without any problems? yes  Have you been able to return to your normal activities? yes  Do you have any questions about your discharge instructions: Diet   no Medications  no Follow up visit  no  Do you have questions or concerns about your Care? no  Actions: * If pain score is 4 or above: No action needed, pain <4.

## 2013-06-16 ENCOUNTER — Encounter: Payer: Self-pay | Admitting: Cardiology

## 2013-06-17 ENCOUNTER — Encounter: Payer: Self-pay | Admitting: Family Medicine

## 2013-06-17 ENCOUNTER — Ambulatory Visit (INDEPENDENT_AMBULATORY_CARE_PROVIDER_SITE_OTHER): Payer: BC Managed Care – PPO | Admitting: Family Medicine

## 2013-06-17 VITALS — BP 118/72 | HR 76

## 2013-06-17 DIAGNOSIS — S76212A Strain of adductor muscle, fascia and tendon of left thigh, initial encounter: Secondary | ICD-10-CM

## 2013-06-17 DIAGNOSIS — IMO0002 Reserved for concepts with insufficient information to code with codable children: Secondary | ICD-10-CM

## 2013-06-17 NOTE — Patient Instructions (Signed)
Good to see you Do exercises about 3 times a week now.  OK to do any activity you want.  When replacing food after workout 4:1 ratio is ideal of Carbs to protein After any workout over 1 hour you need carbs of some kind with water.   Either gatorade and 8 oz after first hour then 4 oz every 15-30 minutes thereafter.  You should be getting at least 40-50 grams of protein a day.  Carbs in the day are good stop though before 6 pm.   Starting in February I am measuring muscle glycogen levels if you want.

## 2013-06-17 NOTE — Assessment & Plan Note (Signed)
Patient is doing significantly better. Patient is to decrease her exercises to 3 times a week and start doing more strengthening exercises. We did discuss proper nutrition that could be beneficial as well. Patient can follow up on an as-needed basis.

## 2013-06-17 NOTE — Progress Notes (Signed)
CC: Low back pain left groin pain  HPI: Patient is a pleasant 62 year old female who is coming in for follow up of groin and back pain. Patient was found to have a hip addiuctor strain and was given anti-inflammatories as well as home exercise program. Patient states she is doing extremely better. Patient states that the current pain is approximately 90% better. Patient is able to do all activities and has been working out. Patient was able to do one hour of working out twice this week without any discomfort. Patient did state that she did have her heart do a little bit of palpitations. Patient is followed by a cardiologist. Overall patient is happy with the results to   Past medical, surgical, family and social history reviewed. Medications reviewed all in the electronic medical record.   Review of Systems: No headache, visual changes, nausea, vomiting, diarrhea, constipation, dizziness, abdominal pain, skin rash, fevers, chills, night sweats, weight loss, swollen lymph nodes, body aches, joint swelling, muscle aches, chest pain, shortness of breath, mood changes.   Objective:    Blood pressure 118/72, pulse 76, SpO2 99.00%.   General: No apparent distress alert and oriented x3 mood and affect normal, dressed appropriately.  HEENT: Pupils equal, extraocular movements intact Respiratory: Patient's speak in full sentences and does not appear short of breath Cardiovascular: No lower extremity edema, non tender, no erythema Skin: Warm dry intact with no signs of infection or rash on extremities or on axial skeleton. Abdomen: Soft nontender Neuro: Cranial nerves II through XII are intact, neurovascularly intact in all extremities with 2+ DTRs and 2+ pulses. Lymph: No lymphadenopathy of posterior or anterior cervical chain or axillae bilaterally.  Gait normal with good balance and coordination.  MSK: Non tender with full range of motion and good stability and symmetric strength and tone of  shoulders, elbows, wrist, hip, knee and ankles bilaterally.  Back Exam:  Inspection: Unremarkable  Motion: Flexion 45 deg, Extension 45 deg, Side Bending to 45 deg bilaterally,  Rotation to 45 deg bilaterally  SLR laying: Negative  XSLR laying: Negative  Palpable tenderness: None. FABER: negative. Sensory change: Gross sensation intact to all lumbar and sacral dermatomes.  Reflexes: 2+ at both patellar tendons, 2+ at achilles tendons, Babinski's downgoing.  Strength at foot  Plantar-flexion: 5/5 Dorsi-flexion: 5/5 Eversion: 5/5 Inversion: 5/5  Leg strength  Quad: 5/5 Hamstring: 5/5 Hip flexor: 5/5 Hip abductors: 5/5 and symmetric compared to contralateral side  Patient is nontender in the groin area.. With Valsalva and no masses palpated. Gait unremarkable.    Impression and Recommendations:     This case required medical decision making of moderate complexity.

## 2013-06-17 NOTE — Progress Notes (Signed)
Pre-visit discussion using our clinic review tool. No additional management support is needed unless otherwise documented below in the visit note.  

## 2013-06-22 ENCOUNTER — Telehealth: Payer: Self-pay | Admitting: Cardiology

## 2013-06-22 NOTE — Telephone Encounter (Signed)
error 

## 2013-06-22 NOTE — Telephone Encounter (Signed)
Spoke with patient and let her know I felt she would be very comfortable with Dr Meda Coffee.  She is going to call and schedule her appointment.  Let her know she can call me at any time if needed

## 2013-06-22 NOTE — Telephone Encounter (Signed)
New Problem:  Pt states she would like to speak with Claiborne Billings, RN. Pt states she will give more details when the nurse calls her back. I informed pt that Dr. Meda Coffee will be taking over her care since Dr. Verl Blalock left... Pt did not wish to set up appt at this time. Pt restates she just wants a call back from [place].

## 2013-07-21 ENCOUNTER — Ambulatory Visit: Payer: BC Managed Care – PPO | Admitting: Physician Assistant

## 2013-07-29 ENCOUNTER — Other Ambulatory Visit: Payer: Self-pay | Admitting: Internal Medicine

## 2013-07-30 ENCOUNTER — Ambulatory Visit: Payer: BC Managed Care – PPO | Admitting: Cardiovascular Disease

## 2013-08-07 ENCOUNTER — Encounter: Payer: BC Managed Care – PPO | Admitting: Cardiovascular Disease

## 2013-08-19 ENCOUNTER — Ambulatory Visit (INDEPENDENT_AMBULATORY_CARE_PROVIDER_SITE_OTHER): Payer: BC Managed Care – PPO | Admitting: Cardiovascular Disease

## 2013-08-19 ENCOUNTER — Encounter: Payer: Self-pay | Admitting: Cardiovascular Disease

## 2013-08-19 VITALS — BP 127/76 | HR 66 | Ht 61.5 in | Wt 102.4 lb

## 2013-08-19 DIAGNOSIS — I359 Nonrheumatic aortic valve disorder, unspecified: Secondary | ICD-10-CM

## 2013-08-19 DIAGNOSIS — I340 Nonrheumatic mitral (valve) insufficiency: Secondary | ICD-10-CM

## 2013-08-19 DIAGNOSIS — R002 Palpitations: Secondary | ICD-10-CM

## 2013-08-19 DIAGNOSIS — I059 Rheumatic mitral valve disease, unspecified: Secondary | ICD-10-CM

## 2013-08-19 NOTE — Progress Notes (Signed)
History of Present Illness: 63 yo female with history of premature atrial contractions, palpitations, vasovagal syncope, mild aortic valve insufficiency, mild mitral regurgitation, IBS, GERD here today for cardiac follow up. She has been followed in the past by Dr. Verl Blalock. I am meeting her for the first time today. Last echo 03/27/11 with mild AI, mild MR, normal LV function.   She is here for follow up. She tells me today that she has been doing well. She is very active. She works out every day. No chest pain or SOB. Occasional skipped beats and palpitations. Last for a few seconds.   Primary Care Physician: [facility]   Past Medical History  Diagnosis Date  . Aortic valve disorders   . Mitral valve insufficiency and aortic valve insufficiency   . Other acute reactions to stress   . Internal hemorrhoids without mention of complication   . Unspecified constipation   . Irritable bowel syndrome   . Hemorrhage of rectum and anus   . Microscopic hematuria   . Personal history of urinary calculi   . Other plastic surgery for unacceptable cosmetic appearance     Inj tx fllers/expander  . Esophageal reflux   . Postmenopausal atrophic vaginitis   . Backache, unspecified   . Allergic rhinitis, cause unspecified   . Conversion disorder   . Calculus of kidney   . Migraine headache with aura   . Benign fasciculation-cramp syndrome 12/03/2012     Worsening with  stress, fatigue .  [name] Heart murmur   . Osteopenia     Past Surgical History  Procedure Laterality Date  . Cosmetic procedures w/injection therapy    . Wisdom tooth extraction    . Breast biopsy Right     Current Outpatient Prescriptions  Medication Sig Dispense Refill  . ALREX 0.2 % SUSP Apply 0.2 drops to eye as needed.       . Botulinum Toxin Type A, Cosm, (BOTOX COSMETIC) 50 UNITS SOLR Inject 4-6 Units into the muscle. 3 month  injection      . fluocinonide cream (LIDEX) 2.13 % Apply 1 application topically 2 (two) times  daily as needed.       . Hyaluronic Acid (RESTYLANE IJ) Inject as directed every 6 (six) months.      . hydrocortisone (ANUSOL-HC) 25 MG suppository Place 1 suppository (25 mg total) rectally at bedtime as needed for hemorrhoids or itching (one per rectum daily as needed for hemorrhoids).  30 suppository  3  . ibuprofen (ADVIL,MOTRIN) 200 MG tablet Take 200 mg by mouth as needed.        . meloxicam (MOBIC) 15 MG tablet Take 15 mg by mouth as needed.      . pantoprazole (PROTONIX) 40 MG tablet TAKE 30-60 MIN BEFORE FIRST MEAL OF THE DAY  90 tablet  3  . PREMARIN vaginal cream INSERT 1 GRAM VAGINALLY ONCE A WEEK AS DIRECTED  30 g  1   No current facility-administered medications for this visit.    Allergies  Allergen Reactions  . Codeine Other (See Comments)    Doesn't remeber  . Epinephrine     Heart racing   . Erythromycin Other (See Comments)    Doesn't remember   . Penicillins Other (See Comments)    Doesn't remember   . Sulfonamide Derivatives Other (See Comments)    Doesn't remember     History   Social History  . Marital Status: Married    Spouse Name: N/A  Number of Children: 0  . Years of Education: 14   Occupational History  . OFFICE liasion   . [employer]    Social History Main Topics  . Smoking status: Never Smoker   . Smokeless tobacco: Never Used  . Alcohol Use: No  . Drug Use: No  . Sexual Activity: Not on file   Other Topics Concern  . Not on file   Social History Narrative   [employer]  in [place]. Married '90 - marriage is in very good shape '12, No children.  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 [facility]red medical services for them if needed (Nov '11)             Family History  Problem Relation Age of Onset  . Hyperlipidemia Father   . COPD Father     Smoker  . Hyperlipidemia Mother   . Diabetes Neg Hx   . Colon cancer Neg Hx   . Breast  cancer Neg Hx   . Coronary artery disease Neg Hx   . Cancer Neg Hx     breast or colon  . Hypertension Father   . Hypertension Mother     Review of Systems:  As stated in the HPI and otherwise negative.   BP 127/76  Pulse 66  Ht 5' 1.5" (1.562 m)  Wt 102 lb 6.4 oz (46.448 kg)  BMI 19.04 kg/m2  Physical Examination: General: Well developed, well nourished, NAD HEENT: OP clear, mucus membranes moist SKIN: warm, dry. No rashes. Neuro: No focal deficits Musculoskeletal: Muscle strength 5/5 all ext Psychiatric: Mood and affect normal Neck: No JVD, no carotid bruits, no thyromegaly, no lymphadenopathy. Lungs:Clear bilaterally, no wheezes, rhonci, crackles Cardiovascular: Regular rate and rhythm. No murmurs, gallops or rubs. Abdomen:Soft. Bowel sounds present. Non-tender.  Extremities: No lower extremity edema. Pulses are 2 + in the bilateral DP/PT.  Echo 03/27/11: Left ventricle: The cavity size was normal. Wall thickness was normal. Systolic function was normal. The estimated ejection fraction was in the range of 55% to 60%. Wall motion was normal; there were no regional wall motion abnormalities. Left ventricular diastolic function parameters were normal. - Aortic valve: Mild regurgitation. - Mitral valve: Mild regurgitation.  EKG: NSR, rate 66 bpm. Normal EKG  Assessment and Plan:   1. Palpitations: She is known to have premature beats. Event monitor 2013 with PVCs. No change recently. Rare palpitations. Avoid stimulants. I have reviewed this with her today.   2. Aortic valve insufficiency: Mild by echo 2012. Will repeat now to reassess  3. Mitral regurgitation: Mild by echo 2012. Repeat echo  Extensive chart review today since I am just meeting her. Reviewed echo, event monitor report and EKG today. Old records reviewed.

## 2013-08-19 NOTE — Patient Instructions (Signed)

## 2013-08-20 ENCOUNTER — Other Ambulatory Visit (INDEPENDENT_AMBULATORY_CARE_PROVIDER_SITE_OTHER): Payer: BC Managed Care – PPO

## 2013-08-20 ENCOUNTER — Ambulatory Visit (INDEPENDENT_AMBULATORY_CARE_PROVIDER_SITE_OTHER): Payer: BC Managed Care – PPO | Admitting: Internal Medicine

## 2013-08-20 ENCOUNTER — Encounter: Payer: Self-pay | Admitting: Internal Medicine

## 2013-08-20 VITALS — BP 108/62 | HR 82 | Temp 97.6°F | Wt 103.0 lb

## 2013-08-20 DIAGNOSIS — S0990XA Unspecified injury of head, initial encounter: Secondary | ICD-10-CM

## 2013-08-20 DIAGNOSIS — Z0000 Encounter for general adult medical examination without abnormal findings: Secondary | ICD-10-CM

## 2013-08-20 DIAGNOSIS — S098XXA Other specified injuries of head, initial encounter: Secondary | ICD-10-CM

## 2013-08-20 LAB — BASIC METABOLIC PANEL
BUN: 22 mg/dL (ref 6–23)
CALCIUM: 9.6 mg/dL (ref 8.4–10.5)
CO2: 29 mEq/L (ref 19–32)
CREATININE: 0.8 mg/dL (ref 0.4–1.2)
Chloride: 104 mEq/L (ref 96–112)
GFR: 76.45 mL/min (ref >60.00)
Glucose, Bld: 93 mg/dL (ref 70–99)
Potassium: 5 mEq/L (ref 3.5–5.1)
Sodium: 138 mEq/L (ref 135–145)

## 2013-08-20 LAB — CBC WITH DIFFERENTIAL/PLATELET
Basophils Absolute: 0.1 10*3/uL (ref 0.0–0.1)
Basophils Relative: 1.1 % (ref 0.0–3.0)
EOS ABS: 0.1 10*3/uL (ref 0.0–0.7)
Eosinophils Relative: 2.3 % (ref 0.0–5.0)
HCT: 39.9 % (ref 36.0–46.0)
Hemoglobin: 13.6 g/dL (ref 12.0–15.0)
LYMPHS PCT: 23 % (ref 12.0–46.0)
Lymphs Abs: 1.2 10*3/uL (ref 0.7–4.0)
MCHC: 33.9 g/dL (ref 30.0–36.0)
MCV: 91.6 fl (ref 78.0–100.0)
Monocytes Absolute: 0.4 10*3/uL (ref 0.1–1.0)
Monocytes Relative: 7.1 % (ref 3.0–12.0)
NEUTROS PCT: 66.5 % (ref 43.0–77.0)
Neutro Abs: 3.4 10*3/uL (ref 1.4–7.7)
PLATELETS: 206 10*3/uL (ref 150.0–400.0)
RBC: 4.36 Mil/uL (ref 3.87–5.11)
RDW: 12.6 % (ref 11.5–14.6)
WBC: 5.2 10*3/uL (ref 4.5–10.5)

## 2013-08-20 MED ORDER — AZITHROMYCIN 250 MG PO TABS: ORAL_TABLET | ORAL | Status: DC
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
## 2013-08-20 NOTE — Progress Notes (Signed)
Pre visit review using our clinic review tool, if applicable. No additional management support is needed unless otherwise documented below in the visit note. 
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
## 2013-08-25 ENCOUNTER — Other Ambulatory Visit: Payer: Self-pay | Admitting: Internal Medicine

## 2013-08-26 ENCOUNTER — Ambulatory Visit: Payer: Self-pay | Admitting: Internal Medicine

## 2013-09-07 ENCOUNTER — Ambulatory Visit (HOSPITAL_COMMUNITY): Payer: BC Managed Care – PPO | Attending: Cardiology | Admitting: Radiology

## 2013-09-07 ENCOUNTER — Encounter: Payer: Self-pay | Admitting: Cardiology

## 2013-09-07 DIAGNOSIS — I359 Nonrheumatic aortic valve disorder, unspecified: Secondary | ICD-10-CM | POA: Insufficient documentation

## 2013-09-07 DIAGNOSIS — I340 Nonrheumatic mitral (valve) insufficiency: Secondary | ICD-10-CM

## 2013-09-07 DIAGNOSIS — I059 Rheumatic mitral valve disease, unspecified: Secondary | ICD-10-CM | POA: Insufficient documentation

## 2013-09-07 NOTE — Progress Notes (Signed)
Echocardiogram performed.  

## 2013-09-21 ENCOUNTER — Telehealth: Payer: Self-pay | Admitting: *Deleted

## 2013-09-21 ENCOUNTER — Other Ambulatory Visit (INDEPENDENT_AMBULATORY_CARE_PROVIDER_SITE_OTHER): Payer: BC Managed Care – PPO

## 2013-09-21 DIAGNOSIS — N390 Urinary tract infection, site not specified: Secondary | ICD-10-CM

## 2013-09-21 LAB — URINALYSIS, ROUTINE W REFLEX MICROSCOPIC
Bilirubin Urine: NEGATIVE
Ketones, ur: NEGATIVE
LEUKOCYTES UA: NEGATIVE
Nitrite: NEGATIVE
Specific Gravity, Urine: 1.02 (ref 1.000–1.030)
Total Protein, Urine: NEGATIVE
URINE GLUCOSE: NEGATIVE
UROBILINOGEN UA: 0.2 (ref 0.0–1.0)
pH: 5.5 (ref 5.0–8.0)

## 2013-09-21 NOTE — Telephone Encounter (Signed)
Ok Thx 

## 2013-09-21 NOTE — Telephone Encounter (Signed)
Pt called requesting a UA order sent to lab so she will be able to leave a UA specimen for UTI symptoms.  Please advise

## 2013-09-21 NOTE — Telephone Encounter (Signed)
Spoke with pt advised order placed. 

## 2013-11-10 ENCOUNTER — Encounter: Payer: Self-pay | Admitting: Cardiovascular Disease

## 2013-12-01 ENCOUNTER — Ambulatory Visit (INDEPENDENT_AMBULATORY_CARE_PROVIDER_SITE_OTHER): Payer: BC Managed Care – PPO | Admitting: Internal Medicine

## 2013-12-01 ENCOUNTER — Encounter: Payer: Self-pay | Admitting: Internal Medicine

## 2013-12-01 VITALS — BP 120/76 | HR 65 | Temp 98.5°F | Ht 62.0 in | Wt 104.0 lb

## 2013-12-01 DIAGNOSIS — R059 Cough, unspecified: Secondary | ICD-10-CM

## 2013-12-01 DIAGNOSIS — R05 Cough: Secondary | ICD-10-CM

## 2013-12-01 NOTE — Progress Notes (Signed)
d Subjective:   Patient ID: [name], female    DOB: [birth year]  MRN: [id number]    Brief patient profile:  68  yowf never smoker with new onset " throat drainage" w/in a few years of arriving in [place] in late 80's and placed on shots for up to 10 years some better worse 3 years off shots and back on shots ever since but still having intermittent problems with tickle in throat dx as vcd in early 2000s sometimes  exac by cig smoke, rx with relaxation techniques but no meds effective to date    History of Present Illness  08/15/2012 1st pulmonary eval cc recurrent tickling throat leading to severe choking spells started 08/08/12 p latex enamel  paint  Exposure  in her  house   rx by UC then ER then [facility] rx zmax and robitissuin. Thinks may have had gerd prior to last episode but denies any obvious chronic gerd or overt sinus symptoms assoc itch sneeze or ant rhinorrhea. No other obvious exp hex. Only sob with cough, no wheeze, not brought on be exercise.   Last episode severe tickle/ choking was > 1 year prior to OV  rec The key to effective treatment for your cough is eliminating the non-stop cycle of cough you're stuck in long enough to let your airway heal completely and then see if there is anything still making you cough once you stop the cough suppression, but this should take no more than 5 days to figuure out  First take delsym two tsp every 12 hours and supplement if needed with  tramadol 50 mg up to 2 every 4 hours to suppress the urge to cough. Swallowing water or using ice chips/non mint and menthol containing candies (such as lifesavers or sugarless jolly ranchers) are also effective.  You should rest your voice and avoid activities that you know make you cough. Once you have eliminated the cough for 3 straight days try reducing the tramadol first,  then the delsym as tolerated.   Pantoprazole 40 mg  Take 30-60 min before first meal of the day and Zantac (ranitidine) 300 mg  one  bedtime until cough is completely gone for at least a week without the need for cough suppression GERD diet   12/01/2013 f/u ov/Dashaun Onstott re: ? Needs ppi  Chief Complaint  Patient presents with  . Follow-up    Pt here to discuss whether or not to continue PPI. Her cough is still gone. No new co's today.   Not limited by breathing from desired activities  - no need for cough meds  No obvious day to day or daytime variabilty or assoc chronic cough or cp or chest tightness, subjective wheeze overt sinus or hb symptoms. No unusual exp hx or h/o childhood pna/ asthma or knowledge of premature birth.  Sleeping ok without nocturnal  or early am exacerbation  of respiratory  c/o's or need for noct saba. Also denies any obvious fluctuation of symptoms with weather or environmental changes or other aggravating or alleviating factors except as outlined above   Current Medications, Allergies, Complete Past Medical History, Past Surgical History, Family History, and Social History were reviewed in [employer] record.  ROS  The following are not active complaints unless bolded sore throat, dysphagia, dental problems, itching, sneezing,  nasal congestion or excess/ purulent secretions, ear ache,   fever, chills, sweats, unintended wt loss, pleuritic or exertional cp, hemoptysis,  orthopnea pnd or leg swelling, presyncope, palpitations, heartburn,  abdominal pain, anorexia, nausea, vomiting, diarrhea  or change in bowel or urinary habits, change in stools or urine, dysuria,hematuria,  rash, arthralgias, visual complaints, headache, numbness weakness or ataxia or problems with walking or coordination,  change in mood/affect or memory.                    Objective:   Physical Exam  amb wf nad  12/01/2013        104  Wt Readings from Last 3 Encounters:  08/15/12 104 lb (47.174 kg)  08/14/12 104 lb (47.174 kg)  08/12/12 104 lb (47.174 kg)    HEENT: nl dentition, turbinates, and  orophanx. Nl external ear canals without cough reflex   NECK :  without JVD/Nodes/TM/ nl carotid upstrokes bilaterally   LUNGS: no acc muscle use, clear to A and P bilaterally without cough on insp or exp maneuvers   CV:  RRR  no s3 or murmur or increase in P2, no edema   ABD:  soft and nontender with nl excursion in the supine position. No bruits or organomegaly, bowel sounds nl  MS:  warm without deformities, calf tenderness, cyanosis or clubbing  SKIN: warm and dry without lesions    NEURO:  alert, approp, no deficits     cxr 08/12/12 No acute cardiopulmonary abnormalities.      Assessment & Plan:

## 2013-12-01 NOTE — Patient Instructions (Addendum)
Try off pantoprazole and as long as not coughing just use pepcid ac 20 mg after bfast and supper x 2 weeks then stop if no cough  If cough recurs, resume pantoprazole Take 30- 60 min before your first and last meals of the day until better then once daily.  GERD (REFLUX)  is an extremely common cause of respiratory symptoms, many times with no significant heartburn at all.    It can be treated with medication, but also with lifestyle changes including avoidance of late meals, excessive alcohol, smoking cessation, and avoid fatty foods, chocolate, peppermint, colas, red wine, and acidic juices such as orange juice.  NO MINT OR MENTHOL PRODUCTS SO NO COUGH DROPS  USE SUGARLESS CANDY INSTEAD (jolley ranchers or Stover's)  NO OIL BASED VITAMINS - use powdered substitutes.

## 2013-12-02 NOTE — Assessment & Plan Note (Signed)
Classic Upper airway cough syndrome, so named because it's frequently impossible to sort out how much is  CR/sinusitis with freq throat clearing (which can be related to primary GERD)   vs  causing  secondary (" extra esophageal")  GERD from wide swings in gastric pressure that occur with throat clearing, often  promoting self use of mint and menthol lozenges that reduce the lower esophageal sphincter tone and exacerbate the problem further in a cyclical fashion.   These are the same pts (now being labeled as having "irritable larynx syndrome" by some cough centers) who not infrequently have a history of having failed to tolerate ace inhibitors,  dry powder inhalers or biphosphonates or report having atypical reflux symptoms that don't respond to standard doses of PPI , and are easily confused as having aecopd or asthma flares by even experienced allergists/ pulmonologists.  [name] has responded well to treatment directed at uacs esp GERD rx but may not need this longterm For now should be able to taper off acid suppression if follows diet carefully but if flares off GERD rx next step is GI eval.

## 2013-12-03 ENCOUNTER — Ambulatory Visit (INDEPENDENT_AMBULATORY_CARE_PROVIDER_SITE_OTHER): Payer: BC Managed Care – PPO | Admitting: Sports Medicine

## 2013-12-03 ENCOUNTER — Encounter: Payer: Self-pay | Admitting: Sports Medicine

## 2013-12-03 VITALS — BP 121/72 | Ht 62.0 in | Wt 103.0 lb

## 2013-12-03 DIAGNOSIS — M21619 Bunion of unspecified foot: Secondary | ICD-10-CM

## 2013-12-03 NOTE — Assessment & Plan Note (Signed)
She has done well now with custom orthotics for 10 years. We actually only expected these to give her good support for about 3 years.  New orthotics are made today.  Bunions have progressed slowly so I think is okay for her to continue her sports activity and running  Return when newr orthotics needed or prn

## 2013-12-03 NOTE — Progress Notes (Signed)
Patient ID: [name], female   DOB: Jun 22, 1950, 63 y.o.   MRN: [id number] 63 year old female presents for replacement of custom orthotics. She's had her orthotics for approximately 10-12 years. She runs 5K event periodically. She denies any foot pain. She is placed orthotics to help minimize the progression of bunions. Should excessive pronation as well as some mild metatarsalgia on transverse arch collapse. She is no complaints with [facility] orthotics were set up. She runs comfortably in them and has had not any issues with foot pain.  Pertinent past medical history: Aortic insufficiency  Social history: Nonsmoker, no alcohol  Review of systems as per history of present illness otherwise all systems negative  Examination: BP 121/72  Ht 5\' 2"  (1.575 m)  Wt 103 lb (46.72 kg)  BMI 18.83 kg/m2 Well-developed well-nourished 63 year old female awake alert oriented in no acute distress  Bilateral feet: Bunion deformities the MTP joint bilaterally Loss of the transverse arch Excessive pronation with gait Neutral arch with collapse with standing  Cardiac exam  soft aortic area murmur but no gallop or rub. Rhythm is regular. Carotids free of any bruits  Patient was fitted for a : standard, cushioned, semi-rigid orthotic. The orthotic was heated and afterward the patient stood on the orthotic blank positioned on the orthotic stand. The patient was positioned in subtalar neutral position and 10 degrees of ankle dorsiflexion in a weight bearing stance. After completion of molding, a stable base was applied to the orthotic blank. The blank was ground to a stable position for weight bearing. Size: 6 Base: blue eva Posting: none Additional orthotic padding: MT pads bilaterally.  Greater than 45 minutes of face to face time was spent in obtaining history, evaluation of running form and feet as well as production of custom orthotics.  Gait after orthotic production:  Good running form of a  neutral gait.

## 2013-12-09 ENCOUNTER — Ambulatory Visit: Payer: BC Managed Care – PPO | Admitting: Neurology

## 2013-12-11 ENCOUNTER — Ambulatory Visit (INDEPENDENT_AMBULATORY_CARE_PROVIDER_SITE_OTHER): Payer: BC Managed Care – PPO | Admitting: Neurology

## 2013-12-11 ENCOUNTER — Encounter: Payer: Self-pay | Admitting: Neurology

## 2013-12-11 VITALS — BP 113/72 | HR 68 | Ht 62.0 in | Wt 103.0 lb

## 2013-12-11 DIAGNOSIS — R259 Unspecified abnormal involuntary movements: Secondary | ICD-10-CM

## 2013-12-11 DIAGNOSIS — R253 Fasciculation: Secondary | ICD-10-CM | POA: Insufficient documentation

## 2013-12-11 NOTE — Progress Notes (Signed)
[facility]  Provider:  Dr Brett Fairy Referring Provider: Neena Rhymes, MD Primary Care Physician:  [name], MD    HPI:  [name] is a 63 y.o. female here as a referral from [name] for follow up on her condition of  benign fasciculation.   She has meanwhile reduced or eliminated her caffeine intake and noticed a reduction in twitches. Her results of the EMG and NCV study have helped her to relax and fee safe with her condition.  She is snoring now, which doesn't bother her but her spouse. She has noted a feeling of thumping at the right ear and her ENT suggested that this may be a manifestation of her fasciculations.  Her parents lives in [place], [place].  Her husband is an Emergency planning/management officer, now with [employer].     Last visit note :  The patient reports that she had 2 spells of near-syncope one in May and one earlier in June of 2014 .  She also has a history of migraines with aura, benign fasciculations which have affected also facial muscles, at times presenting like a tic. The benign fasciculations have been present for over 2 decades and are not related to a minor atrophic lateral sclerosis. Pulse of the near-syncope spells but the patient reported had a diplopia component and appeared to have been experienced as a vertical. The patient reports that the first spell was likely related to dehydration but is not quite sure about the second. The spells don't last long and if she takes fluid and fluid they quickly resolve and do not return. She was sent by her primary care physician for an MRI of the brain which returned normal. She has been seen by [name]  for the brief dizziness, loss of vision , diplopia.   She is diagnosed of with macular degeneration early stage , myopia, and ocular migraines.  Vision was 20/25 in both eyes, ocular  pressure was 18 in the right, and 19 mm in the left eye and [name]  reported that the patient is early cataract  formation but can see well. The optic nerves are healthy , but there is a macular drusen in the right eye he recommended ocular vitamins and to continue contact lens use the patient was otherwise found to be in good health ,and he did not impose any restrictions on driving activities of daily living etc. The detailed report is reviewed here today as is her MRI report which was entirely normal.  I explained that migraines can change to an Rachel state with reaching the 63th decade of life. Many women that experienced severe nausea and headaches associated with menstrual cycles, has later in life the aura i without  headaches to follow . Is benign fasciculations there is truly not changed there is no treatment available nor is it necessary. Is important for the patient to stay hydrated and could not get hypoglycemic as these will lead to presyncopal spells as well as an aunt who took and frequency rise in fasciculations, and  asked her to restrict caffeine intake    Review of Systems: Out of a complete 14 system review, the patient complains of only the following symptoms, and all other reviewed systems are negative. Less twitching . Snoring .  History   Social History  . Marital Status: Married    Spouse Name: [name]    Number of Children: 0  . Years of Education: 14   Occupational History  .  OFFICE liasion   . [employer]   . [employer]ICE [employer]    Social History Main Topics  . Smoking status: Never Smoker   . Smokeless tobacco: Never Used  . Alcohol Use: No  . Drug Use: No  . Sexual Activity: Not on file   Other Topics Concern  . Not on file   Social History Narrative   [employer]  in [place]. Married '90 - marriage is in very good shape '12, No children.  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 Offerred medical services for them if needed (Nov '11)   Patient is married [name]) and lives at  home with her husband.   Patient is working Administrator, arts work.   Patient has a high school education.   Patient is right-handed.   Patient does not drink any caffeine.          Family History  Problem Relation Age of Onset  . Hyperlipidemia Father   . COPD Father     Smoker  . Hyperlipidemia Mother   . Diabetes Neg Hx   . Colon cancer Neg Hx   . Breast cancer Neg Hx   . Coronary artery disease Neg Hx   . Cancer Neg Hx     breast or colon  . Hypertension Father   . Hypertension Mother     Past Medical History  Diagnosis Date  . Aortic valve disorders   . Mitral valve insufficiency and aortic valve insufficiency   . Other acute reactions to stress   . Internal hemorrhoids without mention of complication   . Unspecified constipation   . Irritable bowel syndrome   . Hemorrhage of rectum and anus   . Microscopic hematuria   . Personal history of urinary calculi   . Other plastic surgery for unacceptable cosmetic appearance     Inj tx fllers/expander  . Esophageal reflux   . Postmenopausal atrophic vaginitis   . Backache, unspecified   . Allergic rhinitis, cause unspecified   . Conversion disorder   . Calculus of kidney   . Migraine headache with aura   . Benign fasciculation-cramp syndrome 12/03/2012     Worsening with  stress, fatigue .  [name] Heart murmur   . Osteopenia     Past Surgical History  Procedure Laterality Date  . Cosmetic procedures w/injection therapy    . Wisdom tooth extraction    . Breast biopsy Right     Current Outpatient Prescriptions  Medication Sig Dispense Refill  . ALREX 0.2 % SUSP Apply 0.2 drops to eye as needed.       . Botulinum Toxin Type A, Cosm, (BOTOX COSMETIC) 50 UNITS SOLR Inject 4-6 Units into the muscle. 3 month  injection      . fluocinonide cream (LIDEX) 7.65 % Apply 1 application topically 2 (two) times daily as needed.       . Hyaluronic Acid (RESTYLANE IJ) Inject as directed every 6 (six) months.      .  hydrocortisone (ANUSOL-HC) 25 MG suppository Place 1 suppository (25 mg total) rectally at bedtime as needed for hemorrhoids or itching (one per rectum daily as needed for hemorrhoids).  30 suppository  3  . ibuprofen (ADVIL,MOTRIN) 200 MG tablet Take 200 mg by mouth as needed.        . pantoprazole (PROTONIX) 40 MG tablet TAKE 30-60 MIN BEFORE FIRST MEAL OF THE DAY  90 tablet  3  . PREMARIN vaginal cream INSERT 1 GRAM VAGINALLY ONCE A WEEK AS DIRECTED  30 g  1   No current facility-administered medications for this visit.    Allergies as of 12/11/2013 - Review Complete 12/11/2013  Allergen Reaction Noted  . Codeine Other (See Comments)   . Epinephrine  08/12/2012  . Erythromycin Other (See Comments)   . Penicillins Other (See Comments)   . Sulfonamide derivatives Other (See Comments)     Vitals: BP 113/72  Pulse 68  Ht 5\' 2"  (1.575 m)  Wt 103 lb (46.72 kg)  BMI 18.83 kg/m2 Last Weight:  Wt Readings from Last 1 Encounters:  12/11/13 103 lb (46.72 kg)   Last Height:   Ht Readings from Last 1 Encounters:  12/11/13 5\' 2"  (1.575 m)   Vision Screening:  Left eye with correction 25/20.  Right eye with correction 25/20. Monovision, contacts.   Physical exam:  General: The patient is awake, alert and appears not in acute distress. The patient is well groomed. Head: Normocephalic, atraumatic. Neck is supple. Mallampati 1- wide open-  neck circumference 12.5 inches ,  No retrognathia. TMJ click on the right.  Cardiovascular:  Regular rate and rhythm, without  murmurs or carotid bruit, and without distended neck veins. Respiratory: Lungs are clear to auscultation. Skin:  Without evidence of edema, or rash Trunk: BMI low normal.  Neurologic exam : The patient is awake and alert, oriented to place and time.   Memory subjective described as intact. There is a normal attention span & concentration ability. Speech is fluent without  dysarthria, dysphonia or aphasia. Mood and affect are  appropriate.  Cranial nerves: Pupils are equal and briskly reactive to light. Funduscopic exam without  evidence of pallor or edema. Extraocular movements  in vertical and horizontal planes intact and without nystagmus. Visual fields by finger perimetry are intact. Hearing to finger rub intact.  Facial sensation intact to fine touch. Facial motor strength is symmetric and tongue and uvula move midline.  Motor exam: Normal tone and normal muscle bulk and symmetric normal strength in all extremities.  Sensory:  Fine touch, pinprick and vibration were tested in all extremities. Proprioception is tested in the upper extremities only. This was normal.  Coordination: Rapid alternating movements in the fingers/hands isnormal. Finger-to-nose maneuver tested and normal without evidence of ataxia, dysmetria or tremor.  Gait and station: Patient walks without assistive device . Strength within normal limits. Stance is stable and normal.  Tandem gait isunfragmented. Romberg testing normal.  Deep tendon reflexes: in the  upper and lower extremities are symmetric and intact.    Assessment:  After physical and neurologic examination, review of laboratory studies, imaging, neurophysiology testing and pre-existing records. - benign fasciculation. Snoring , not witnessed apnea.   Benign fasciculations may also affect facial muscles as well at extraocular movements. [name]

## 2013-12-21 ENCOUNTER — Other Ambulatory Visit (INDEPENDENT_AMBULATORY_CARE_PROVIDER_SITE_OTHER): Payer: BC Managed Care – PPO

## 2013-12-21 ENCOUNTER — Encounter: Payer: Self-pay | Admitting: Family Medicine

## 2013-12-21 ENCOUNTER — Ambulatory Visit (INDEPENDENT_AMBULATORY_CARE_PROVIDER_SITE_OTHER): Payer: BC Managed Care – PPO | Admitting: Family Medicine

## 2013-12-21 VITALS — BP 104/70 | HR 73 | Ht 62.0 in | Wt 102.0 lb

## 2013-12-21 DIAGNOSIS — M25561 Pain in right knee: Secondary | ICD-10-CM

## 2013-12-21 DIAGNOSIS — M25569 Pain in unspecified knee: Secondary | ICD-10-CM

## 2013-12-21 DIAGNOSIS — M23200 Derangement of unspecified lateral meniscus due to old tear or injury, right knee: Secondary | ICD-10-CM

## 2013-12-21 DIAGNOSIS — M23206 Derangement of unspecified meniscus due to old tear or injury, right knee: Secondary | ICD-10-CM

## 2013-12-21 DIAGNOSIS — M23209 Derangement of unspecified meniscus due to old tear or injury, unspecified knee: Secondary | ICD-10-CM | POA: Insufficient documentation

## 2013-12-21 DIAGNOSIS — M23302 Other meniscus derangements, unspecified lateral meniscus, unspecified knee: Secondary | ICD-10-CM

## 2013-12-21 DIAGNOSIS — M23203 Derangement of unspecified medial meniscus due to old tear or injury, right knee: Secondary | ICD-10-CM

## 2013-12-21 NOTE — Progress Notes (Signed)
  [name] [address] [place], [place] [address] Phone: [phone] Subjective:      CC: Right knee pain  UKG:[id number] [name] is a 63 y.o. female coming in with complaint of right knee pain. Patient has had this pain for about one month. Does not remember any true injury. Patient has been working out longer and longer. Patient started having pain more with doing deeper squats. Patient only has the pain now when she does any type of squatting or some mild rotational component. No pain with regular daily activities and no pain at rest. Patient has tried to augment some of her activities but continues to workout fairly regularly. Patient states that it seems to be getting worse     Past medical history, social, surgical and family history all reviewed in electronic medical record.   Review of Systems: No headache, visual changes, nausea, vomiting, diarrhea, constipation, dizziness, abdominal pain, skin rash, fevers, chills, night sweats, weight loss, swollen lymph nodes, body aches, joint swelling, muscle aches, chest pain, shortness of breath, mood changes.   Objective Blood pressure 104/70, pulse 73, height 5\' 2"  (1.575 m), weight 102 lb (46.267 kg), SpO2 97.00%.  General: No apparent distress alert and oriented x3 mood and affect normal, dressed appropriately.  HEENT: Pupils equal, extraocular movements intact  Respiratory: Patient's speak in full sentences and does not appear short of breath  Cardiovascular: No lower extremity edema, non tender, no erythema  Skin: Warm dry intact with no signs of infection or rash on extremities or on axial skeleton.  Abdomen: Soft nontender  Neuro: Cranial nerves II through XII are intact, neurovascularly intact in all extremities with 2+ DTRs and 2+ pulses.  Lymph: No lymphadenopathy of posterior or anterior cervical chain or axillae bilaterally.  Gait normal with good balance and coordination.  MSK:   Non tender with full range of motion and good stability and symmetric strength and tone of shoulders, elbows, wrist, hip, and ankles bilaterally.  Knee: Right Normal to inspection with no erythema or effusion or obvious bony abnormalities. Palpation normal with no warmth, joint line tenderness, patellar tenderness, or condyle tenderness. ROM full in flexion and extension and lower leg rotation. Ligaments with solid consistent endpoints including ACL, PCL, LCL, MCL. Positive Mcmurray's, Apley's, and Thessalonian tests. Non painful patellar compression. Patellar glide without crepitus. Patellar and quadriceps tendons unremarkable. Hamstring and quadriceps strength is normal.  Contralateral knee unremarkable  MSK US performed of: Right knee This study was ordered, performed, and interpreted by Charlann Boxer D.O.  Knee: All structures visualized. Posterior medial meniscus does have what appears to be a very small intersubstance tearing does have mild displacement of the meniscus. There is no joint line narrowing. Anteromedial, anterolateral,  and posterolateral menisci unremarkable without tearing, fraying, effusion, or displacement. Patellar Tendon unremarkable on long and transverse views without effusion. No abnormality of prepatellar bursa. LCL and MCL unremarkable on long and transverse views. No abnormality of origin of medial or lateral head of the gastrocnemius.  IMPRESSION:  Very small chronic meniscal tear of the medial meniscus of the right knee.      Impression and Recommendations:     This case required medical decision making of moderate complexity.

## 2013-12-21 NOTE — Assessment & Plan Note (Signed)
Patient does have a very small posterior medial meniscal tear. There is very mild displacement noted. I do think patient is going to do very well with conservative therapy. We discussed about over-the-counter medications as well as anti-inflammatories. We discussed icing. Patient was given home exercise. Patient was fitted in a brace by me today. Patient is going to try these interventions and come back and see me again in 3-4 weeks for further evaluation and treatment. We also discussed which activities to avoid.  Spent greater than 25 minutes with patient face-to-face and had greater than 50% of counseling including as described above in assessment and plan.

## 2013-12-21 NOTE — Patient Instructions (Signed)
Good to see you again.   Ice 20 minutes 2 times daily. Usually after activity and before bed. Wear the compression sleeve in the car.  Wear the brace with running or lifting.  No squatting great then 65 degrees Exercises 3 times a week.  Come back in 3-4 weeks.

## 2013-12-22 ENCOUNTER — Encounter: Payer: Self-pay | Admitting: Internal Medicine

## 2013-12-22 ENCOUNTER — Ambulatory Visit (INDEPENDENT_AMBULATORY_CARE_PROVIDER_SITE_OTHER): Payer: BC Managed Care – PPO | Admitting: Internal Medicine

## 2013-12-22 VITALS — BP 130/78 | Temp 98.6°F | Wt 102.0 lb

## 2013-12-22 DIAGNOSIS — R21 Rash and other nonspecific skin eruption: Secondary | ICD-10-CM | POA: Insufficient documentation

## 2013-12-22 MED ORDER — TRIAMCINOLONE ACETONIDE 0.5 % EX CREA: 1.0000 "application " | TOPICAL_CREAM | Freq: Four times a day (QID) | CUTANEOUS | Status: DC

## 2013-12-22 MED ORDER — AZITHROMYCIN 250 MG PO TABS: ORAL_TABLET | ORAL | Status: DC

## 2013-12-22 NOTE — Progress Notes (Signed)
Subjective:     HPI  C/o bug bite on L leg on Sun - they were walking in the woods, burning and itching of acute onsed  Past Medical History  Diagnosis Date  . Aortic valve disorders   . Mitral valve insufficiency and aortic valve insufficiency   . Other acute reactions to stress   . Internal hemorrhoids without mention of complication   . Unspecified constipation   . Irritable bowel syndrome   . Hemorrhage of rectum and anus   . Microscopic hematuria   . Personal history of urinary calculi   . Other plastic surgery for unacceptable cosmetic appearance     Inj tx fllers/expander  . Esophageal reflux   . Postmenopausal atrophic vaginitis   . Backache, unspecified   . Allergic rhinitis, cause unspecified   . Conversion disorder   . Calculus of kidney   . Migraine headache with aura   . Benign fasciculation-cramp syndrome 12/03/2012     Worsening with  stress, fatigue .  [name] Heart murmur   . Osteopenia    Past Surgical History  Procedure Laterality Date  . Cosmetic procedures w/injection therapy    . Wisdom tooth extraction    . Breast biopsy Right    Family History  Problem Relation Age of Onset  . Hyperlipidemia Father   . COPD Father     Smoker  . Hyperlipidemia Mother   . Diabetes Neg Hx   . Colon cancer Neg Hx   . Breast cancer Neg Hx   . Coronary artery disease Neg Hx   . Cancer Neg Hx     breast or colon  . Hypertension Father   . Hypertension Mother    History   Social History  . Marital Status: Married    Spouse Name: [name]    Number of Children: 0  . Years of Education: 14   Occupational History  . OFFICE liasion   . [employer]   . [employer]    Social History Main Topics  . Smoking status: Never Smoker   . Smokeless tobacco: Never Used  . Alcohol Use: No  . Drug Use: No  . Sexual Activity: Not on file   Other Topics Concern  . Not on file   Social History Narrative   [employer]  in [place]. Married '90 - marriage is  in very good shape '12, No children.  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 [facility] medical services for them if needed (Nov '11)   Patient is married [name]) and lives at home with her husband.   Patient is working Administrator, arts work.   Patient has a high school education.   Patient is right-handed.   Patient does not drink any caffeine.          Current Outpatient Prescriptions on File Prior to Visit  Medication Sig Dispense Refill  . ALREX 0.2 % SUSP Apply 0.2 drops to eye as needed.       . Botulinum Toxin Type A, Cosm, (BOTOX COSMETIC) 50 UNITS SOLR Inject 4-6 Units into the muscle. 3 month  injection      . fluocinonide cream (LIDEX) 4.85 % Apply 1 application topically 2 (two) times daily as needed.       . Hyaluronic Acid (RESTYLANE IJ) Inject as directed every 6 (six) months.      . hydrocortisone (ANUSOL-HC) 25 MG suppository Place  1 suppository (25 mg total) rectally at bedtime as needed for hemorrhoids or itching (one per rectum daily as needed for hemorrhoids).  30 suppository  3  . ibuprofen (ADVIL,MOTRIN) 200 MG tablet Take 200 mg by mouth as needed.        . pantoprazole (PROTONIX) 40 MG tablet TAKE 30-60 MIN BEFORE FIRST MEAL OF THE DAY  90 tablet  3  . PREMARIN vaginal cream INSERT 1 GRAM VAGINALLY ONCE A WEEK AS DIRECTED  30 g  1   No current facility-administered medications on file prior to visit.      Review of Systems  Constitutional: Negative for activity change, appetite change, fatigue and unexpected weight change.  HENT: Negative for congestion, mouth sores and sinus pressure.   Eyes: Negative for visual disturbance.  Respiratory: Negative for chest tightness.   Gastrointestinal: Negative for nausea.  Genitourinary: Negative for frequency, difficulty urinating and vaginal pain.  Musculoskeletal: Negative for back pain and gait problem.  Skin: Negative for pallor  and rash.  Neurological: Negative for dizziness, tremors, weakness and numbness.  Psychiatric/Behavioral: Negative for confusion and sleep disturbance.   Constitutional:  Negative for fever, chills, activity change and unexpected weight change.  HEENT:  Negative for hearing loss, ear pain, congestion, neck stiffness and postnasal drip. Negative for sore throat or swallowing problems. Negative for dental complaints.   Eyes: Negative for vision loss or change in visual acuity.  Respiratory: Negative for chest tightness and wheezing. Negative for DOE.   Cardiovascular: Negative for chest pain or palpitations. No decreased exercise tolerance Gastrointestinal: No change in bowel habit. No bloating or gas. No reflux or indigestion Genitourinary: Negative for urgency, frequency, flank pain and difficulty urinating.  Musculoskeletal: Negative for myalgias, back pain, arthralgias and gait problem.  Neurological: Negative for dizziness, tremors, weakness and headaches.  Hematological: Negative for adenopathy.  Psychiatric/Behavioral: Negative for behavioral problems and dysphoric mood.       Objective:   Physical Exam  Constitutional: She appears well-developed. No distress.  HENT:  Head: Normocephalic.  Right Ear: External ear normal.  Left Ear: External ear normal.  Nose: Nose normal.  Mouth/Throat: Oropharynx is clear and moist.  Eyes: Conjunctivae are normal. Pupils are equal, round, and reactive to light. Right eye exhibits no discharge. Left eye exhibits no discharge.  Neck: Normal range of motion. Neck supple. No JVD present. No tracheal deviation present. No thyromegaly present.  Cardiovascular: Normal rate, regular rhythm and normal heart sounds.   Pulmonary/Chest: No stridor. No respiratory distress. She has no wheezes.  Abdominal: Soft. Bowel sounds are normal. She exhibits no distension and no mass. There is no tenderness. There is no rebound and no guarding.  Musculoskeletal: She  exhibits no edema and no tenderness.  Lymphadenopathy:    She has no cervical adenopathy.  Neurological: She displays normal reflexes. No cranial nerve deficit. She exhibits normal muscle tone. Coordination normal.  Skin: No rash noted. No erythema.  Psychiatric: She has a normal mood and affect. Her behavior is normal. Judgment and thought content normal.   Filed Vitals:   12/22/13 1538  BP: 130/78  Temp: 98.6 F (37 C)   Wt Readings from Last 3 Encounters:  12/22/13 102 lb (46.267 kg)  12/21/13 102 lb (46.267 kg)  12/11/13 103 lb (46.72 kg)   Gen'l: well nourished, well developed white Woman in no distress HEENT - [place]/AT, EACs/TMs normal, oropharynx with native dentition in good condition, no buccal or palatal lesions, posterior pharynx clear, mucous membranes moist.  C&S clear, PERRLA, fundi - normal Neck - supple, no thyromegaly Nodes- negative submental, cervical, supraclavicular regions Chest - no deformity, no CVAT Lungs - clear without rales, wheezes. No increased work of breathing Breast - - Skin normal, nipples w/o discharge, no fixed mass or lesion, no axillary adenopathy. Cardiovascular - regular rate and rhythm, quiet precordium, no murmurs, rubs or gallops, 2+ radial, DP and PT pulses Abdomen - BS+ x 4, no HSM, no guarding or rebound or tenderness Pelvic - NEG/BUS normal, small introitus (used adolescent speculum) - vaginal mucosa normal, retroflexed uterus, PAP scraping performed. Rectal - deferred  Extremities - no clubbing, cyanosis, edema or deformity.  Neuro - A&O x 3, CN II-XII normal, motor strength normal and equal, DTRs 2+ and symmetrical biceps, radial, and patellar tendons. Cerebellar - no tremor, no rigidity, fluid movement and normal gait. Derm - Post prox thigh with three 2 mm eryth papules  Lab Results  Component Value Date   WBC 5.2 08/20/2013   HGB 13.6 08/20/2013   HCT 39.9 08/20/2013   PLT 206.0 08/20/2013   GLUCOSE 93 08/20/2013   CHOL 182 09/05/2011    TRIG 43.0 09/05/2011   HDL 80.00 09/05/2011   LDLCALC 93 09/05/2011   ALT 23 09/11/2012   AST 20 09/11/2012   NA 138 08/20/2013   K 5.0 08/20/2013   CL 104 08/20/2013   CREATININE 0.8 08/20/2013   BUN 22 08/20/2013   CO2 29 08/20/2013   TSH 1.61 09/05/2011         Assessment & Plan:

## 2013-12-22 NOTE — Assessment & Plan Note (Signed)
7/15 L thigh - likely insect bites - doubt other etiology Triamcinolone qid Zpac if it gets infected

## 2013-12-22 NOTE — Progress Notes (Signed)
Pre visit review using our clinic review tool, if applicable. No additional management support is needed unless otherwise documented below in the visit note. 

## 2013-12-25 ENCOUNTER — Ambulatory Visit (INDEPENDENT_AMBULATORY_CARE_PROVIDER_SITE_OTHER): Payer: BC Managed Care – PPO | Admitting: Internal Medicine

## 2013-12-25 ENCOUNTER — Encounter: Payer: Self-pay | Admitting: Internal Medicine

## 2013-12-25 VITALS — BP 130/80 | HR 68 | Temp 98.8°F | Resp 16 | Wt 103.0 lb

## 2013-12-25 DIAGNOSIS — R21 Rash and other nonspecific skin eruption: Secondary | ICD-10-CM

## 2013-12-25 NOTE — Progress Notes (Signed)
Subjective:     HPI  F/u bug bite on L leg on Sun - they were walking in the woods, burning and itching of acute onset  Past Medical History  Diagnosis Date  . Aortic valve disorders   . Mitral valve insufficiency and aortic valve insufficiency   . Other acute reactions to stress   . Internal hemorrhoids without mention of complication   . Unspecified constipation   . Irritable bowel syndrome   . Hemorrhage of rectum and anus   . Microscopic hematuria   . Personal history of urinary calculi   . Other plastic surgery for unacceptable cosmetic appearance     Inj tx fllers/expander  . Esophageal reflux   . Postmenopausal atrophic vaginitis   . Backache, unspecified   . Allergic rhinitis, cause unspecified   . Conversion disorder   . Calculus of kidney   . Migraine headache with aura   . Benign fasciculation-cramp syndrome 12/03/2012     Worsening with  stress, fatigue .  [name] Heart murmur   . Osteopenia    Past Surgical History  Procedure Laterality Date  . Cosmetic procedures w/injection therapy    . Wisdom tooth extraction    . Breast biopsy Right    Family History  Problem Relation Age of Onset  . Hyperlipidemia Father   . COPD Father     Smoker  . Hyperlipidemia Mother   . Diabetes Neg Hx   . Colon cancer Neg Hx   . Breast cancer Neg Hx   . Coronary artery disease Neg Hx   . Cancer Neg Hx     breast or colon  . Hypertension Father   . Hypertension Mother    History   Social History  . Marital Status: Married    Spouse Name: [name]    Number of Children: 0  . Years of Education: 14   Occupational History  . OFFICE liasion   . [employer]   . [employer]    Social History Main Topics  . Smoking status: Never Smoker   . Smokeless tobacco: Never Used  . Alcohol Use: No  . Drug Use: No  . Sexual Activity: Not on file   Other Topics Concern  . Not on file   Social History Narrative   [employer]  in [place]. Married '90 - marriage is  in very good shape '12, No children.  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 [facility] medical services for them if needed (Nov '11)   Patient is married [name]) and lives at home with her husband.   Patient is working Administrator, arts work.   Patient has a high school education.   Patient is right-handed.   Patient does not drink any caffeine.          Current Outpatient Prescriptions on File Prior to Visit  Medication Sig Dispense Refill  . ALREX 0.2 % SUSP Apply 0.2 drops to eye as needed.       . Botulinum Toxin Type A, Cosm, (BOTOX COSMETIC) 50 UNITS SOLR Inject 4-6 Units into the muscle. 3 month  injection      . fluocinonide cream (LIDEX) 7.34 % Apply 1 application topically 2 (two) times daily as needed.       . Hyaluronic Acid (RESTYLANE IJ) Inject as directed every 6 (six) months.      . hydrocortisone (ANUSOL-HC) 25 MG suppository Place  1 suppository (25 mg total) rectally at bedtime as needed for hemorrhoids or itching (one per rectum daily as needed for hemorrhoids).  30 suppository  3  . ibuprofen (ADVIL,MOTRIN) 200 MG tablet Take 200 mg by mouth as needed.        . pantoprazole (PROTONIX) 40 MG tablet TAKE 30-60 MIN BEFORE FIRST MEAL OF THE DAY  90 tablet  3  . PREMARIN vaginal cream INSERT 1 GRAM VAGINALLY ONCE A WEEK AS DIRECTED  30 g  1  . triamcinolone cream (KENALOG) 0.5 % Apply 1 application topically 4 (four) times daily.  15 g  1   No current facility-administered medications on file prior to visit.      Review of Systems  Constitutional: Negative for activity change, appetite change, fatigue and unexpected weight change.  HENT: Negative for congestion, mouth sores and sinus pressure.   Eyes: Negative for visual disturbance.  Respiratory: Negative for chest tightness.   Gastrointestinal: Negative for nausea.  Genitourinary: Negative for frequency, difficulty urinating and  vaginal pain.  Musculoskeletal: Negative for back pain and gait problem.  Skin: Negative for pallor and rash.  Neurological: Negative for dizziness, tremors, weakness and numbness.  Psychiatric/Behavioral: Negative for confusion and sleep disturbance.   Constitutional:  Negative for fever, chills, activity change and unexpected weight change.  HEENT:  Negative for hearing loss, ear pain, congestion, neck stiffness and postnasal drip. Negative for sore throat or swallowing problems. Negative for dental complaints.   Eyes: Negative for vision loss or change in visual acuity.  Respiratory: Negative for chest tightness and wheezing. Negative for DOE.   Cardiovascular: Negative for chest pain or palpitations. No decreased exercise tolerance Gastrointestinal: No change in bowel habit. No bloating or gas. No reflux or indigestion Genitourinary: Negative for urgency, frequency, flank pain and difficulty urinating.  Musculoskeletal: Negative for myalgias, back pain, arthralgias and gait problem.  Neurological: Negative for dizziness, tremors, weakness and headaches.  Hematological: Negative for adenopathy.  Psychiatric/Behavioral: Negative for behavioral problems and dysphoric mood.       Objective:   Physical Exam  Constitutional: She appears well-developed. No distress.  HENT:  Head: Normocephalic.  Right Ear: External ear normal.  Left Ear: External ear normal.  Nose: Nose normal.  Mouth/Throat: Oropharynx is clear and moist.  Eyes: Conjunctivae are normal. Pupils are equal, round, and reactive to light. Right eye exhibits no discharge. Left eye exhibits no discharge.  Neck: Normal range of motion. Neck supple. No JVD present. No tracheal deviation present. No thyromegaly present.  Cardiovascular: Normal rate, regular rhythm and normal heart sounds.   Pulmonary/Chest: No stridor. No respiratory distress. She has no wheezes.  Abdominal: Soft. Bowel sounds are normal. She exhibits no  distension and no mass. There is no tenderness. There is no rebound and no guarding.  Musculoskeletal: She exhibits no edema and no tenderness.  Lymphadenopathy:    She has no cervical adenopathy.  Neurological: She displays normal reflexes. No cranial nerve deficit. She exhibits normal muscle tone. Coordination normal.  Skin: No rash noted. No erythema.  Psychiatric: She has a normal mood and affect. Her behavior is normal. Judgment and thought content normal.   Filed Vitals:   12/25/13 1106  BP: 130/80  Pulse: 68  Temp: 98.8 F (37.1 C)  Resp: 16   Wt Readings from Last 3 Encounters:  12/25/13 103 lb (46.72 kg)  12/22/13 102 lb (46.267 kg)  12/21/13 102 lb (46.267 kg)   Gen'l: well nourished, well developed white Woman  in no distress HEENT - [place]/AT, EACs/TMs normal, oropharynx with native dentition in good condition, no buccal or palatal lesions, posterior pharynx clear, mucous membranes moist. C&S clear, PERRLA, fundi - normal Neck - supple, no thyromegaly Nodes- negative submental, cervical, supraclavicular regions Chest - no deformity, no CVAT Lungs - clear without rales, wheezes. No increased work of breathing Breast - - Skin normal, nipples w/o discharge, no fixed mass or lesion, no axillary adenopathy. Cardiovascular - regular rate and rhythm, quiet precordium, no murmurs, rubs or gallops, 2+ radial, DP and PT pulses Abdomen - BS+ x 4, no HSM, no guarding or rebound or tenderness Pelvic - NEG/BUS normal, small introitus (used adolescent speculum) - vaginal mucosa normal, retroflexed uterus, PAP scraping performed. Rectal - deferred  Extremities - no clubbing, cyanosis, edema or deformity.  Neuro - A&O x 3, CN II-XII normal, motor strength normal and equal, DTRs 2+ and symmetrical biceps, radial, and patellar tendons. Cerebellar - no tremor, no rigidity, fluid movement and normal gait. Derm - Post prox thigh with three 2 mm eryth papules - better. No cellulitis  Lab Results   Component Value Date   WBC 5.2 08/20/2013   HGB 13.6 08/20/2013   HCT 39.9 08/20/2013   PLT 206.0 08/20/2013   GLUCOSE 93 08/20/2013   CHOL 182 09/05/2011   TRIG 43.0 09/05/2011   HDL 80.00 09/05/2011   LDLCALC 93 09/05/2011   ALT 23 09/11/2012   AST 20 09/11/2012   NA 138 08/20/2013   K 5.0 08/20/2013   CL 104 08/20/2013   CREATININE 0.8 08/20/2013   BUN 22 08/20/2013   CO2 29 08/20/2013   TSH 1.61 09/05/2011         Assessment & Plan:

## 2013-12-25 NOTE — Progress Notes (Signed)
Pre visit review using our clinic review tool, if applicable. No additional management support is needed unless otherwise documented below in the visit note. 

## 2013-12-25 NOTE — Assessment & Plan Note (Signed)
Zpac if worse 

## 2014-01-18 ENCOUNTER — Ambulatory Visit: Payer: BC Managed Care – PPO | Admitting: Family Medicine

## 2014-01-20 ENCOUNTER — Ambulatory Visit: Payer: BC Managed Care – PPO | Admitting: Family Medicine

## 2014-01-21 ENCOUNTER — Encounter: Payer: Self-pay | Admitting: Internal Medicine

## 2014-01-21 ENCOUNTER — Ambulatory Visit (INDEPENDENT_AMBULATORY_CARE_PROVIDER_SITE_OTHER): Payer: BC Managed Care – PPO | Admitting: Internal Medicine

## 2014-01-21 ENCOUNTER — Ambulatory Visit (INDEPENDENT_AMBULATORY_CARE_PROVIDER_SITE_OTHER): Payer: BC Managed Care – PPO | Admitting: Family Medicine

## 2014-01-21 ENCOUNTER — Encounter: Payer: Self-pay | Admitting: Family Medicine

## 2014-01-21 VITALS — BP 108/60 | HR 68 | Ht 61.5 in | Wt 103.2 lb

## 2014-01-21 VITALS — BP 102/70 | HR 79 | Ht 62.0 in | Wt 103.0 lb

## 2014-01-21 DIAGNOSIS — M21619 Bunion of unspecified foot: Secondary | ICD-10-CM

## 2014-01-21 DIAGNOSIS — K589 Irritable bowel syndrome without diarrhea: Secondary | ICD-10-CM

## 2014-01-21 DIAGNOSIS — K625 Hemorrhage of anus and rectum: Secondary | ICD-10-CM

## 2014-01-21 DIAGNOSIS — M2010 Hallux valgus (acquired), unspecified foot: Secondary | ICD-10-CM

## 2014-01-21 DIAGNOSIS — M23206 Derangement of unspecified meniscus due to old tear or injury, right knee: Principal | ICD-10-CM

## 2014-01-21 DIAGNOSIS — M23200 Derangement of unspecified lateral meniscus due to old tear or injury, right knee: Secondary | ICD-10-CM

## 2014-01-21 DIAGNOSIS — M23302 Other meniscus derangements, unspecified lateral meniscus, unspecified knee: Secondary | ICD-10-CM

## 2014-01-21 DIAGNOSIS — M23203 Derangement of unspecified medial meniscus due to old tear or injury, right knee: Principal | ICD-10-CM

## 2014-01-21 DIAGNOSIS — K219 Gastro-esophageal reflux disease without esophagitis: Secondary | ICD-10-CM

## 2014-01-21 MED ORDER — PANTOPRAZOLE SODIUM 20 MG PO TBEC: 20.0000 mg | DELAYED_RELEASE_TABLET | Freq: Every day | ORAL | Status: DC

## 2014-01-21 NOTE — Patient Instructions (Signed)
Good to see you Orthotics will be ready for you on Monday.  Only 1 hour the first day and increase by 1 hour a day.  Lace your shoes differently not using the last eye of the shoe to make toebox bigger For your back try new exercise.  One knee down, one up, tilt pelvis forward and hold.  If easy then lift arms and rotate upper torso to upper leg.  Hold 10 seconds and repeat.  Do every time after exercise Other exercises in handout do 3 times a week Come back in 3 weeks if back is not perfect.

## 2014-01-21 NOTE — Progress Notes (Signed)
[name] [name], [name] [address] Phone: [phone] Subjective:      CC: Right knee pain followup in foot pain  SAY:[id number] [name] is a 63 y.o. female coming in with complaint of right knee pain. Patient seen previously and did have a small meniscal tear. Patient was trying conservative therapy with bracing, icing, home exercises. Patient states that the pain in her knees levels completely resolved. Patient has been able to do all activities except for squatting deep. Patient has been biking and running without any significant pain. Denies any radiation of pain or stopping her from any activity except for squatting.  Patient does also have foot pain for quite some time. Patient has had custom orthotics made for her previously. Patient is looking for other wants to fit in some of her other shoes and the ones that she has are unfortunately uncomfortable because they are too thick.     Past medical history, social, surgical and family history all reviewed in electronic medical record.   Review of Systems: No headache, visual changes, nausea, vomiting, diarrhea, constipation, dizziness, abdominal pain, skin rash, fevers, chills, night sweats, weight loss, swollen lymph nodes, body aches, joint swelling, muscle aches, chest pain, shortness of breath, mood changes.   Objective Blood pressure 102/70, pulse 79, height 5\' 2"  (1.575 m), weight 103 lb (46.72 kg), SpO2 98.00%.  General: No apparent distress alert and oriented x3 mood and affect normal, dressed appropriately.  HEENT: Pupils equal, extraocular movements intact  Respiratory: Patient's speak in full sentences and does not appear short of breath  Cardiovascular: No lower extremity edema, non tender, no erythema  Skin: Warm dry intact with no signs of infection or rash on extremities or on axial skeleton.  Abdomen: Soft nontender  Neuro: Cranial nerves II through XII are  intact, neurovascularly intact in all extremities with 2+ DTRs and 2+ pulses.  Lymph: No lymphadenopathy of posterior or anterior cervical chain or axillae bilaterally.  Gait normal with good balance and coordination.  MSK:  Non tender with full range of motion and good stability and symmetric strength and tone of shoulders, elbows, wrist, hip, and ankles bilaterally.  Knee: Right Normal to inspection with no erythema or effusion or obvious bony abnormalities. Palpation normal with no warmth, joint line tenderness, patellar tenderness, or condyle tenderness. ROM full in flexion and extension and lower leg rotation. Ligaments with solid consistent endpoints including ACL, PCL, LCL, MCL. Negative Mcmurray's, Apley's, and Thessalonian tests. Non painful patellar compression. Patellar glide without crepitus. Patellar and quadriceps tendons unremarkable. Hamstring and quadriceps strength is normal.  Contralateral knee unremarkable  Exam shows the patient does have severe breakdown of the transverse arch bilaterally with huge bunion and bunionette formations bilaterally. Patient also has fibular deviation of the large toe especially on the left foot with overlapping of the second toe. Fairly neutral hindfoot.   Patient was fitted for a : standard, cushioned, semi-rigid orthotic. The orthotic was heated and afterward the patient stood on the orthotic blank positioned on the orthotic stand. The patient was positioned in subtalar neutral position and 10 degrees of ankle dorsiflexion in a weight bearing stance. After completion of molding, a stable base was applied to the orthotic blank. The blank was ground to a stable position for weight bearing. Size: 7 Base: Blue EVA Additional Posting and Padding: Reinforced first rey The patient ambulated these, and they were very comfortable.  I spent 45 minutes with this patient, greater  than 50% was face-to-face time counseling regarding the below  diagnosis.     Impression and Recommendations:

## 2014-01-21 NOTE — Assessment & Plan Note (Signed)
Patient has severe breakdown the transverse arch. Patient was made orthotics today to have a lower profile this did in her shoes. We discussed proper tying of shoes as well. Discussed with patient her to wear the slowly over the course of time. Patient will wear these mostly she states 4 running activities. Patient will come back and see me again in 3 weeks for further evaluation and treatment.

## 2014-01-21 NOTE — Patient Instructions (Signed)
We have sent the following medications to your pharmacy for you to pick up at your convenience:  Pantoprazole  

## 2014-01-21 NOTE — Progress Notes (Signed)
HISTORY OF PRESENT ILLNESS:  [name] is a 63 y.o. female with the below listed medical history who presents today to discuss acid reflux medication. The patient was evaluated in the office 09/24/2012 after having had problems with acute laryngospasm. She had been felt previously to possibly have GERD. It was not clear that her pulmonary issues were related to GERD. Since that time she has continued on pantoprazole 40 mg daily. She discussed this with her pulmonologist who initiated the medication. She tried to discontinue the medication but developed significant reflux symptoms, mostly pyrosis. On medication no symptoms. No problems with pulmonary issues in greater than one year. Last screening colonoscopy December 2014 was normal. She is continuous chronic constipation and minor rectal bleeding from hemorrhoids  REVIEW OF SYSTEMS:  All non-GI ROS negative . Past Medical History  Diagnosis Date  . Aortic valve disorders   . Mitral valve insufficiency and aortic valve insufficiency   . Other acute reactions to stress   . Internal hemorrhoids without mention of complication   . Unspecified constipation   . Irritable bowel syndrome   . Hemorrhage of rectum and anus   . Microscopic hematuria   . Personal history of urinary calculi   . Other plastic surgery for unacceptable cosmetic appearance     Inj tx fllers/expander  . Esophageal reflux   . Postmenopausal atrophic vaginitis   . Backache, unspecified   . Allergic rhinitis, cause unspecified   . Conversion disorder   . Calculus of kidney   . Migraine headache with aura   . Benign fasciculation-cramp syndrome 12/03/2012     Worsening with  stress, fatigue .  [name] Heart murmur   . Osteopenia     Past Surgical History  Procedure Laterality Date  . Cosmetic procedures w/injection therapy    . Wisdom tooth extraction    . Breast biopsy Right     Social History [name]  reports that she has never smoked. She has  never used smokeless tobacco. She reports that she does not drink alcohol or use illicit drugs.  family history includes COPD in her father; Hyperlipidemia in her father and mother; Hypertension in her father and mother. There is no history of Diabetes, Colon cancer, Breast cancer, Coronary artery disease, or Cancer.  Allergies  Allergen Reactions  . Codeine Other (See Comments)    Doesn't remeber  . Epinephrine     Heart racing   . Penicillins Other (See Comments)    Doesn't remember   . Sulfonamide Derivatives Other (See Comments)    Doesn't remember        PHYSICAL EXAMINATION: Vital signs: BP 108/60  Pulse 68  Ht 5' 1.5" (1.562 m)  Wt 103 lb 4 oz (46.834 kg)  BMI 19.20 kg/m2 General: Well-developed, well-nourished, no acute distress Abdomen: Not examined Psychiatric: alert and oriented x3. Cooperative     ASSESSMENT:  #1. Patient with a history of pulmonary symptoms for which she was placed on short-term steroids, inhalers, and long-term acid suppressive agents. Recently tried to discontinue PPI medication with significant pyrosis. This may represent acid rebound. #2. Constipation predominant IBS #3. Intermittent bleeding from hemorrhoids   PLAN:  #1. Decrease pantoprazole 20 mg daily for one month. If tolerated, decrease pantoprazole 20 mg every other day for one month, then every third day for a few weeks then stop. If she cannot wean off medication because of significant reflux symptoms, then lowest dose to control symptoms would be recommended. #2. Fiber supplementation with Metamucil  or Citrucel #3. Screening colonoscopy due around December 2024

## 2014-01-21 NOTE — Assessment & Plan Note (Signed)
Patient's seems to be asymptomatic state. Patient has good range of motion and hasn't no signs of impingement of the meniscus. Patient is encouraged to increase her activity. Patient still avoid deep squats for another 10 days but after that he is free to do any type of activity. Patient will have a slow progression and if anything worsens we'll go back one-step and will follow up with me again in 3 weeks.

## 2014-01-29 ENCOUNTER — Telehealth: Payer: Self-pay | Admitting: Internal Medicine

## 2014-01-29 NOTE — Telephone Encounter (Signed)
Discussed with pt that Dr. Henrene Pastor wanted her to come off of the pantoprazole if she could but if she could not to use the least amount to control symptoms. Pt will try 40mg  alternating with 20mg  and see how that works.

## 2014-02-05 ENCOUNTER — Telehealth: Payer: Self-pay | Admitting: Internal Medicine

## 2014-02-05 DIAGNOSIS — L989 Disorder of the skin and subcutaneous tissue, unspecified: Secondary | ICD-10-CM | POA: Insufficient documentation

## 2014-02-05 NOTE — Telephone Encounter (Signed)
After conferring with [name], told patient that if alternating between 20mg  and 40mg  of Protonix was working to go ahead and continue the the weekend.  I told her I would send Dr. Henrene Pastor a note asking him if he wanted to her continue taking it this way and would call her after I had gotten a response.  She agreed

## 2014-02-10 ENCOUNTER — Telehealth: Payer: Self-pay

## 2014-02-10 NOTE — Telephone Encounter (Signed)
Relayed Dr. Blanch Media information regarding her Protonix, that she should use whatever lowest dose controls her symptoms.  Patient acknowledged and understood

## 2014-02-10 NOTE — Telephone Encounter (Signed)
Per Dr. Henrene Pastor, pt is to stay on the lowest dose of Protonix that controls her symptoms.  Patient aware

## 2014-02-10 NOTE — Telephone Encounter (Signed)
Message copied by Audrea Muscat on Wed Feb 10, 2014  9:40 AM ------      Message from: Irene Shipper      Created: Wed Feb 10, 2014  9:07 AM       Whatever the lowest dose that controls symptoms. It is conceivable that she may not be able to come off medication.      ----- Message -----         From: Audrea Muscat, CMA         Sent: 02/10/2014   8:50 AM           To: Irene Shipper, MD            Per telephone note by Rosanne Sack dated 01/29/2014, you had orginally wanted pt to taper/discontinue Protonix but pt still had symptoms taking 20mg ; After discussing with Vaughan Basta, patient tried alternating 20mg  and 40mg  each day and had good results.  Pt wants to know if she should continue this dosage indefinitely or do something different.       Please advise       ------

## 2014-02-15 ENCOUNTER — Encounter: Payer: Self-pay | Admitting: Podiatry

## 2014-02-15 ENCOUNTER — Ambulatory Visit (INDEPENDENT_AMBULATORY_CARE_PROVIDER_SITE_OTHER): Payer: BC Managed Care – PPO

## 2014-02-15 ENCOUNTER — Ambulatory Visit (INDEPENDENT_AMBULATORY_CARE_PROVIDER_SITE_OTHER): Payer: BC Managed Care – PPO | Admitting: Podiatry

## 2014-02-15 VITALS — BP 115/66 | HR 69 | Resp 17

## 2014-02-15 DIAGNOSIS — M2010 Hallux valgus (acquired), unspecified foot: Secondary | ICD-10-CM

## 2014-02-15 DIAGNOSIS — R52 Pain, unspecified: Secondary | ICD-10-CM

## 2014-02-15 DIAGNOSIS — M21619 Bunion of unspecified foot: Secondary | ICD-10-CM

## 2014-02-15 DIAGNOSIS — L84 Corns and callosities: Secondary | ICD-10-CM

## 2014-02-15 NOTE — Progress Notes (Signed)
Subjective:     Patient ID: [name], female   DOB: 08-15-1950, 63 y.o.   MRN: [id number]  HPI patient presents stating my bunions are getting worse and my calluses are really bothering me more and I know that ultimately I'm getting need to have them fixed but I would like to wait until the late fall   Review of Systems     Objective:   Physical Exam Neurovascular status intact with muscle strength adequate and [name] of motion of the subtalar and midtarsal joint within normal limits. 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 I have recommended long-term aggressive Austin osteotomy with pin and Akin osteotomy with pin fixation and fusion and shortening of the second toes of both feet. I reviewed what would be required and she wants to have this done later in the year and today I debrided lesions on both feet with no iatrogenic bleeding noted

## 2014-02-15 NOTE — Progress Notes (Signed)
   Subjective:    Patient ID: [name], female    DOB: [birth year], 63 y.o.   MRN: [id number]  HPI  Pt presents with bilateral blocked pores on plantar aspect, also has bilateral bunions that are painful and callussed  Review of Systems     Objective:   Physical Exam        Assessment & Plan:

## 2014-02-16 ENCOUNTER — Ambulatory Visit (INDEPENDENT_AMBULATORY_CARE_PROVIDER_SITE_OTHER): Payer: BC Managed Care – PPO | Admitting: Family Medicine

## 2014-02-16 ENCOUNTER — Telehealth: Payer: Self-pay | Admitting: *Deleted

## 2014-02-16 ENCOUNTER — Encounter: Payer: Self-pay | Admitting: Family Medicine

## 2014-02-16 ENCOUNTER — Other Ambulatory Visit: Payer: Self-pay | Admitting: *Deleted

## 2014-02-16 VITALS — BP 108/62 | HR 72 | Ht 61.5 in | Wt 105.0 lb

## 2014-02-16 DIAGNOSIS — M23200 Derangement of unspecified lateral meniscus due to old tear or injury, right knee: Secondary | ICD-10-CM

## 2014-02-16 DIAGNOSIS — M23206 Derangement of unspecified meniscus due to old tear or injury, right knee: Principal | ICD-10-CM

## 2014-02-16 DIAGNOSIS — M23203 Derangement of unspecified medial meniscus due to old tear or injury, right knee: Principal | ICD-10-CM

## 2014-02-16 DIAGNOSIS — M23302 Other meniscus derangements, unspecified lateral meniscus, unspecified knee: Secondary | ICD-10-CM

## 2014-02-16 MED ORDER — DICLOFENAC SODIUM 2 % TD SOLN: TRANSDERMAL | Status: DC

## 2014-02-16 NOTE — Patient Instructions (Signed)
It is good to see you as always.  Ice is always your friend Kenalog and marcaine is what I would used as injection.  Exercises on wall.  Heel and butt touching.  Raise leg 6 inches and hold 2 seconds.  Down slow for count of 4 seconds.  1 set of 30 reps daily on both sides.  With running try to do line drills, keeping large toes straight forward touching a line would be great.  For your knee continue to monitor and be careful with squatting.  Try pennsaid 2 times daily as needed and will send you a prescription.  Come back in 3-4 weeks and if not perfect come back again and we will do an injection.

## 2014-02-16 NOTE — Assessment & Plan Note (Signed)
Patient is likely having an exacerbation of the chronic meniscal tear that she's had previously. I did see that patient has significant weakness of the hip abductors is likely to cause patient to past the midline with a running gait. Patient will bring shoes and we will watch her right adnexa followup. I believe that the patient has more course strength she will do well. The discussed continuing the orthotics and a regular basis as well. The discussed proper squatting techniques that could be beneficial as well. We discussed line drills which will be good with her gait. Patient and will come back and see me again in 3-4 weeks. If continued to have pain we'll consider an injection.  Spent greater than 25 minutes with patient face-to-face and had greater than 50% of counseling including as described above in assessment and plan.

## 2014-02-16 NOTE — Progress Notes (Signed)
  [name] [address] [place], [place] [address] Phone: [phone] Subjective:      CC: Right knee pain followup in foot pain  [id number] [name] is a 63 y.o. female coming in with complaint of right knee pain. Patient seen previously and did have a small meniscal tear. Patient was trying conservative therapy with bracing, icing, home exercises. Patient was doing very well and only having some mild discomfort with deep squatting. Patient had increased her activity now that patient had custom orthotics. Patient states overall she had been doing very well but has started running and noticed that patient's knee pain is starting to increase again. Patient states is mostly on the medial aspect of her knee and does have some mild lateral hip pain that is associated with it. Denies any clicking or popping or giving out on her. Still some mild discomfort when she does squats down with her knees.      Past medical history, social, surgical and family history all reviewed in electronic medical record.   Review of Systems: No headache, visual changes, nausea, vomiting, diarrhea, constipation, dizziness, abdominal pain, skin rash, fevers, chills, night sweats, weight loss, swollen lymph nodes, body aches, joint swelling, muscle aches, chest pain, shortness of breath, mood changes.   Objective Blood pressure 108/62, pulse 72, height 5' 1.5" (1.562 m), weight 105 lb (47.628 kg), SpO2 96.00%.  General: No apparent distress alert and oriented x3 mood and affect normal, dressed appropriately.  HEENT: Pupils equal, extraocular movements intact  Respiratory: Patient's speak in full sentences and does not appear short of breath  Cardiovascular: No lower extremity edema, non tender, no erythema  Skin: Warm dry intact with no signs of infection or rash on extremities or on axial skeleton.  Abdomen: Soft nontender  Neuro: Cranial nerves II through XII are intact,  neurovascularly intact in all extremities with 2+ DTRs and 2+ pulses.  Lymph: No lymphadenopathy of posterior or anterior cervical chain or axillae bilaterally.  Gait normal with good balance and coordination.  MSK:  Non tender with full range of motion and good stability and symmetric strength and tone of shoulders, elbows, wrist, hip, and ankles bilaterally.  Knee: Right Normal to inspection with no erythema or effusion or obvious bony abnormalities. Mild medial joint line tenderness ROM full in flexion and extension and lower leg rotation. Ligaments with solid consistent endpoints including ACL, PCL, LCL, MCL. Mild discomfort with Mcmurray's, Apley's, and Thessalonian tests. Non painful patellar compression. Patellar glide without crepitus. Patellar and quadriceps tendons unremarkable. Hamstring and quadriceps strength is normal.  Contralateral knee unremarkable 4/5 strength of the hip abductors bilaterally. Contralateral knee unremarkable      Impression and Recommendations:        Moderate complexity of the problem.

## 2014-02-16 NOTE — Telephone Encounter (Signed)
I saw Dr. Paulla Dolly this morning and he took care of everything I came in to see.  I had asked Janett Billow, the nurse, to mention to him that sometime my feet will itch.  Can I get some lotion or something for my feet?

## 2014-02-16 NOTE — Telephone Encounter (Signed)
Called patient. She states that she gets itching from time to time on her feet. I noticed that she has triamcinolone cream in her meds list, asked if she still used this cream. She states that she uses for a rash on her back. Recommended that she try this cream on her feet and if did not help then call us and we could Rx a different cream. Patient states that she will try.

## 2014-03-01 ENCOUNTER — Telehealth: Payer: Self-pay | Admitting: Internal Medicine

## 2014-03-01 NOTE — Telephone Encounter (Signed)
Dr. Tamala Julian is following patient for back and side issues.  She is going back to work today at noon.  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  Dr. Tamala Julian is not in today to see her.  She is requesting a letter to excuse her from filing for a few weeks.  She is also requesting a call back from [place] in regards to an appointment this week.

## 2014-03-01 NOTE — Telephone Encounter (Signed)
Ok Thx 

## 2014-03-01 NOTE — Telephone Encounter (Signed)
Generated letter called pt no answer [facility] letter ready for pick-up. Also will route msg to [place] to have her give yopu a call tomorrow once they are bck in office...[name]

## 2014-03-02 ENCOUNTER — Encounter: Payer: Self-pay | Admitting: Family Medicine

## 2014-03-02 ENCOUNTER — Encounter: Payer: Self-pay | Admitting: *Deleted

## 2014-03-02 ENCOUNTER — Ambulatory Visit (INDEPENDENT_AMBULATORY_CARE_PROVIDER_SITE_OTHER)
Admission: RE | Admit: 2014-03-02 | Discharge: 2014-03-02 | Disposition: A | Discharge status: Home / Self Care | Payer: BC Managed Care – PPO | Source: Ambulatory Visit | Attending: Family Medicine | Admitting: Family Medicine

## 2014-03-02 ENCOUNTER — Ambulatory Visit (INDEPENDENT_AMBULATORY_CARE_PROVIDER_SITE_OTHER): Payer: BC Managed Care – PPO | Admitting: Family Medicine

## 2014-03-02 VITALS — BP 132/80 | HR 77 | Ht 61.5 in | Wt 105.0 lb

## 2014-03-02 DIAGNOSIS — S335XXA Sprain of ligaments of lumbar spine, initial encounter: Secondary | ICD-10-CM

## 2014-03-02 DIAGNOSIS — M545 Low back pain, unspecified: Secondary | ICD-10-CM

## 2014-03-02 NOTE — Telephone Encounter (Signed)
Pt was seen by dr. Tamala Julian today.

## 2014-03-02 NOTE — Patient Instructions (Signed)
Good to see you as always.  Push that knee a little Light duty only for 2 weeks.  Ice when you need it.  Pennsaid on the side if you need it.  Xray today.  Come back in 2 weeks to check in again.

## 2014-03-02 NOTE — Assessment & Plan Note (Signed)
I believe the patient does have a lumbar strain on the lateral aspect mostly on the right side. We will get x-rays rule out any bony deformity today. Patient seems to be associated in the pain mostly with her repetitive squatting twisting and reaching at work. Patient states that she does not have any trouble otherwise. Patient is not having any hematuria in no signs or symptoms of kidney stones that she's had previously. Patient denies any bowel or bladder incontinence and denies any weight loss recently. Patient will try topical anti-inflammatories as well as a two-week trial of light duty. Patient will come back in 2 weeks check an. At that time if continuing to do well we will advance her to full duty trial for couple weeks. The pain comes back and we'll consider putting patient on permanent partial impairment.  Spent greater than 25 minutes with patient face-to-face and had greater than 50% of counseling including as described above in assessment and plan.

## 2014-03-02 NOTE — Progress Notes (Signed)
[name] [address] [place], [place] [address] Phone: [phone] Subjective:     CC: Right sided pain  [id number] [name] is a 63 y.o. female coming in with complaint of right-sided pain. This is a new problem for an established patient. Patient states that she was having this pain on the right side knee the lumbar area on the lateral aspect. Patient has had history of kidney stones previously but states that this does not have similar. It only seems to occur when patient does certain activities. Patient states it usually when she is at work. 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 Patient went out of town for the last week and states that she did not have any pain even though she was very active. Patient remains able to work out on a daily basis without any pain but once again the repetitive reaching at work seems to be the limiting that is uncomfortable. Patient is only working part-time and has many other obligations at work that does not seem to give her pain. Patient denies any hematuria, any pain with urination, any changes in bowel habits, or any abnormal weight loss. Denies any fevers or chills as well. Describes the pain as a dull aching sensation and rates it as 5/10 when it occurs. Seems to get better when patient stops the activity.     Past medical history, social, surgical and family history all reviewed in electronic medical record.   Review of Systems: No headache, visual changes, nausea, vomiting, diarrhea, constipation, dizziness, abdominal pain, skin rash, fevers, chills, night sweats, weight loss, swollen lymph nodes, body aches, joint swelling, muscle aches, chest pain, shortness of breath, mood changes.   Objective Blood pressure 132/80, pulse 77, height 5' 1.5" (1.562 m), weight 105 lb (47.628 kg), SpO2 99.00%.  General: No apparent distress alert and  oriented x3 mood and affect normal, dressed appropriately.  HEENT: Pupils equal, extraocular movements intact  Respiratory: Patient's speak in full sentences and does not appear short of breath  Cardiovascular: No lower extremity edema, non tender, no erythema  Skin: Warm dry intact with no signs of infection or rash on extremities or on axial skeleton.  Abdomen: Soft nontender no organomegaly noted, no CVA tenderness Neuro: Cranial nerves II through XII are intact, neurovascularly intact in all extremities with 2+ DTRs and 2+ pulses.  Lymph: No lymphadenopathy of posterior or anterior cervical chain or axillae bilaterally.  Gait normal with good balance and coordination.  MSK:  Non tender with full range of motion and good stability and symmetric strength and tone of shoulders, elbows, wrist, hip, knee and ankles bilaterally.  Back Exam:  Inspection: Unremarkable  Motion: Flexion 35 deg, Extension 35 deg, Side Bending to 35 deg bilaterally,  Rotation to 45 deg bilaterally  SLR laying: Negative  XSLR laying: Negative  Palpable tenderness: Minimal tenderness in the paraspinal musculature of the lumbar right side. No spinous process tenderness.. FABER: negative. Sensory change: Gross sensation intact to all lumbar and sacral dermatomes.  Reflexes: 2+ at both patellar tendons, 2+ at achilles tendons, Babinski's downgoing.  Strength at foot  Plantar-flexion: 5/5 Dorsi-flexion: 5/5 Eversion: 5/5 Inversion: 5/5  Leg strength  Quad: 5/5 Hamstring: 5/5 Hip flexor: 5/5 Hip abductors: 4/5  Gait unremarkable.     Impression and Recommendations:     This case required medical decision making of moderate complexity.

## 2014-03-09 ENCOUNTER — Telehealth: Payer: Self-pay | Admitting: Internal Medicine

## 2014-03-10 NOTE — Telephone Encounter (Signed)
Patient reported that she changed from alternating 20mg  and 40mg  of Pantoprazole every other day to 40mg  every day.  Wanted to make sure this was ok.  I told her to take whatever helped her symptoms - 40mg  every day was fine.  Patient acknowledged and understood

## 2014-03-16 ENCOUNTER — Ambulatory Visit: Payer: BC Managed Care – PPO

## 2014-03-17 ENCOUNTER — Other Ambulatory Visit (INDEPENDENT_AMBULATORY_CARE_PROVIDER_SITE_OTHER): Payer: BC Managed Care – PPO

## 2014-03-17 ENCOUNTER — Ambulatory Visit (INDEPENDENT_AMBULATORY_CARE_PROVIDER_SITE_OTHER): Payer: BC Managed Care – PPO | Admitting: Family Medicine

## 2014-03-17 ENCOUNTER — Encounter: Payer: Self-pay | Admitting: Family Medicine

## 2014-03-17 VITALS — BP 120/80 | HR 78 | Temp 98.5°F | Wt 104.8 lb

## 2014-03-17 DIAGNOSIS — N390 Urinary tract infection, site not specified: Secondary | ICD-10-CM

## 2014-03-17 DIAGNOSIS — Z5189 Encounter for other specified aftercare: Secondary | ICD-10-CM

## 2014-03-17 DIAGNOSIS — S335XXD Sprain of ligaments of lumbar spine, subsequent encounter: Secondary | ICD-10-CM

## 2014-03-17 DIAGNOSIS — S335XXA Sprain of ligaments of lumbar spine, initial encounter: Secondary | ICD-10-CM

## 2014-03-17 DIAGNOSIS — R300 Dysuria: Secondary | ICD-10-CM

## 2014-03-17 LAB — URINALYSIS, ROUTINE W REFLEX MICROSCOPIC
Bilirubin Urine: NEGATIVE
KETONES UR: NEGATIVE
LEUKOCYTES UA: NEGATIVE
Nitrite: NEGATIVE
PH: 6.5 (ref 5.0–8.0)
TOTAL PROTEIN, URINE-UPE24: NEGATIVE
Urine Glucose: NEGATIVE
Urobilinogen, UA: 0.2 (ref 0.0–1.0)

## 2014-03-17 NOTE — Progress Notes (Signed)
  [name] [address] [place], [place] [address] Phone: [phone] Subjective:     CC: Right sided pain follow up  [id number] [name] is a 63 y.o. female coming in with complaint of right-sided pain. Patient's pain seem to be in mostly muscular skeletal. Patient has had a history of kidney stones but states it is felt different. 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 Patient was limited on her amount of filing she is doing her work over the course last 2 weeks. Patient states at that time and is almost completely resolved. Patient states though she does any repetitive activity with her right arm she does feel some of the discomfort. Denies any radiation down her arms or legs. Still not debilitating. Still able to do regular daily activities without any significant discomfort. Patient is really not taking any medication on a regular basis for this.     Past medical history, social, surgical and family history all reviewed in electronic medical record.   Review of Systems: No headache, visual changes, nausea, vomiting, diarrhea, constipation, dizziness, abdominal pain, skin rash, fevers, chills, night sweats, weight loss, swollen lymph nodes, body aches, joint swelling, muscle aches, chest pain, shortness of breath, mood changes.   Objective Blood pressure 120/80, pulse 78, temperature 98.5 F (36.9 C), temperature source Oral, weight 104 lb 12.8 oz (47.537 kg), SpO2 98.00%.  General: No apparent distress alert and oriented x3 mood and affect normal, dressed appropriately.  HEENT: Pupils equal, extraocular movements intact  Respiratory: Patient's speak in full sentences and does not appear short of breath  Cardiovascular: No lower extremity edema, non tender, no erythema  Skin: Warm dry intact with no signs of infection or rash on extremities or on axial skeleton.  Abdomen: Soft  nontender no organomegaly noted, no CVA tenderness Neuro: Cranial nerves II through XII are intact, neurovascularly intact in all extremities with 2+ DTRs and 2+ pulses.  Lymph: No lymphadenopathy of posterior or anterior cervical chain or axillae bilaterally.  Gait normal with good balance and coordination.  MSK:  Non tender with full range of motion and good stability and symmetric strength and tone of shoulders, elbows, wrist, hip, knee and ankles bilaterally.  Back Exam:  Inspection: Unremarkable  Motion: Flexion 35 deg, Extension 35 deg, Side Bending to 35 deg bilaterally,  Rotation to 45 deg bilaterally  SLR laying: Negative  XSLR laying: Negative  Palpable tenderness: Significant decrease in the tenderness of the paraspinal musculature. FABER: negative. Sensory change: Gross sensation intact to all lumbar and sacral dermatomes.  Reflexes: 2+ at both patellar tendons, 2+ at achilles tendons, Babinski's downgoing.  Strength at foot  Plantar-flexion: 5/5 Dorsi-flexion: 5/5 Eversion: 5/5 Inversion: 5/5  Leg strength  Quad: 5/5 Hamstring: 5/5 Hip flexor: 5/5 Hip abductors: 4/5  Gait unremarkable.     Impression and Recommendations:     This case required medical decision making of moderate complexity.

## 2014-03-17 NOTE — Patient Instructions (Signed)
Good to see you Ice is your friend Will advance you at work Will get UA today.  Vitamin D 2000 IU daily Come back in 3 weeks.

## 2014-03-17 NOTE — Progress Notes (Signed)
Pre visit review using our clinic review tool, if applicable. No additional management support is needed unless otherwise documented below in the visit note. 

## 2014-03-17 NOTE — Assessment & Plan Note (Signed)
The patient is improving slowly. Patient x-ray showed that she does have mild to moderate arthritis of the lumbar spine. We are going to continue to monitor that. Patient will continue the over-the-counter medications. We are going to advance patient's at work and have her avoid significant stooping and repetitive activity no. In addition this patient will have a urinalysis to rule out any other kidney stones or any other etiology that could be contributing to her back discomfort. Patient will follow up with me again in 3 weeks for further evaluation and treatment.  Spent greater than 25 minutes with patient face-to-face and had greater than 50% of counseling including as described above in assessment and plan.

## 2014-03-18 ENCOUNTER — Ambulatory Visit: Payer: BC Managed Care – PPO | Admitting: Internal Medicine

## 2014-03-22 ENCOUNTER — Ambulatory Visit (INDEPENDENT_AMBULATORY_CARE_PROVIDER_SITE_OTHER): Payer: BC Managed Care – PPO | Admitting: Internal Medicine

## 2014-03-22 ENCOUNTER — Encounter: Payer: Self-pay | Admitting: Internal Medicine

## 2014-03-22 VITALS — BP 120/74 | HR 82 | Temp 98.6°F | Resp 16 | Ht 61.5 in | Wt 105.0 lb

## 2014-03-22 DIAGNOSIS — M545 Low back pain, unspecified: Secondary | ICD-10-CM | POA: Insufficient documentation

## 2014-03-22 DIAGNOSIS — R922 Inconclusive mammogram: Secondary | ICD-10-CM

## 2014-03-22 DIAGNOSIS — R3129 Other microscopic hematuria: Secondary | ICD-10-CM | POA: Insufficient documentation

## 2014-03-22 DIAGNOSIS — R312 Other microscopic hematuria: Secondary | ICD-10-CM

## 2014-03-22 DIAGNOSIS — Z23 Encounter for immunization: Secondary | ICD-10-CM

## 2014-03-22 NOTE — Progress Notes (Signed)
Subjective:     HPI  F/u LBP, dense breasts on mammo, microhematuria - not new   Past Medical History  Diagnosis Date  . Aortic valve disorders   . Mitral valve insufficiency and aortic valve insufficiency   . Other acute reactions to stress   . Internal hemorrhoids without mention of complication   . Unspecified constipation   . Irritable bowel syndrome   . Hemorrhage of rectum and anus   . Microscopic hematuria   . Personal history of urinary calculi   . Other plastic surgery for unacceptable cosmetic appearance     Inj tx fllers/expander  . Esophageal reflux   . Postmenopausal atrophic vaginitis   . Backache, unspecified   . Allergic rhinitis, cause unspecified   . Conversion disorder   . Calculus of kidney   . Migraine headache with aura   . Benign fasciculation-cramp syndrome 12/03/2012     Worsening with  stress, fatigue .  [name] Heart murmur   . Osteopenia    Past Surgical History  Procedure Laterality Date  . Cosmetic procedures w/injection therapy    . Wisdom tooth extraction    . Breast biopsy Right    Family History  Problem Relation Age of Onset  . Hyperlipidemia Father   . COPD Father     Smoker  . Hyperlipidemia Mother   . Diabetes Neg Hx   . Colon cancer Neg Hx   . Breast cancer Neg Hx   . Coronary artery disease Neg Hx   . Cancer Neg Hx     breast or colon  . Hypertension Father   . Hypertension Mother    History   Social History  . Marital Status: Married    Spouse Name: [name]    Number of Children: 0  . Years of Education: 14   Occupational History  . OFFICE liasion   . [employer]   . [employer]    Social History Main Topics  . Smoking status: Never Smoker   . Smokeless tobacco: Never Used  . Alcohol Use: No  . Drug Use: No  . Sexual Activity: Not on file   Other Topics Concern  . Not on file   Social History Narrative   [employer]  in [place]. Married '90 - marriage is in very good shape '12, No children.   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 Offerred medical services for them if needed (Nov '11)   Patient is married [name]) and lives at home with her husband.   Patient is working Administrator, arts work.   Patient has a high school education.   Patient is right-handed.   Patient does not drink any caffeine.          Current Outpatient Prescriptions on File Prior to Visit  Medication Sig Dispense Refill  . ALREX 0.2 % SUSP Apply 0.2 drops to eye as needed.       . Botulinum Toxin Type A, Cosm, (BOTOX COSMETIC) 50 UNITS SOLR Inject 4-6 Units into the muscle. 3 month  injection      . Diclofenac Sodium 2 % SOLN Apply twice daily.  112 g  1  . fluocinonide cream (LIDEX) 9.62 % Apply 1 application topically 2 (two) times daily as needed.       . Hyaluronic Acid (RESTYLANE IJ) Inject as directed every 6 (six) months.      . hydrocortisone (  ANUSOL-HC) 25 MG suppository Place 1 suppository (25 mg total) rectally at bedtime as needed for hemorrhoids or itching (one per rectum daily as needed for hemorrhoids).  30 suppository  3  . ibuprofen (ADVIL,MOTRIN) 200 MG tablet Take 200 mg by mouth as needed.        . pantoprazole (PROTONIX) 40 MG tablet TAKE 30-60 MIN BEFORE FIRST MEAL OF THE DAY  90 tablet  3  . PREMARIN vaginal cream INSERT 1 GRAM VAGINALLY ONCE A WEEK AS DIRECTED  30 g  1  . triamcinolone cream (KENALOG) 0.5 % Apply 1 application topically 4 (four) times daily.  15 g  1   No current facility-administered medications on file prior to visit.      Review of Systems  Constitutional: Negative for activity change, appetite change, fatigue and unexpected weight change.  HENT: Negative for congestion, mouth sores and sinus pressure.   Eyes: Negative for visual disturbance.  Respiratory: Negative for chest tightness.   Gastrointestinal: Negative for nausea.  Genitourinary: Negative for frequency,  difficulty urinating and vaginal pain.  Musculoskeletal: Negative for back pain and gait problem.  Skin: Negative for pallor and rash.  Neurological: Negative for dizziness, tremors, weakness and numbness.  Psychiatric/Behavioral: Negative for confusion and sleep disturbance.   Constitutional:  Negative for fever, chills, activity change and unexpected weight change.  HEENT:  Negative for hearing loss, ear pain, congestion, neck stiffness and postnasal drip. Negative for sore throat or swallowing problems. Negative for dental complaints.   Eyes: Negative for vision loss or change in visual acuity.  Respiratory: Negative for chest tightness and wheezing. Negative for DOE.   Cardiovascular: Negative for chest pain or palpitations. No decreased exercise tolerance Gastrointestinal: No change in bowel habit. No bloating or gas. No reflux or indigestion Genitourinary: Negative for urgency, frequency, flank pain and difficulty urinating.  Musculoskeletal: Negative for myalgias, back pain, arthralgias and gait problem.  Neurological: Negative for dizziness, tremors, weakness and headaches.  Hematological: Negative for adenopathy.  Psychiatric/Behavioral: Negative for behavioral problems and dysphoric mood.       Objective:   Physical Exam  Constitutional: She appears well-developed. No distress.  HENT:  Head: Normocephalic.  Right Ear: External ear normal.  Left Ear: External ear normal.  Nose: Nose normal.  Mouth/Throat: Oropharynx is clear and moist.  Eyes: Conjunctivae are normal. Pupils are equal, round, and reactive to light. Right eye exhibits no discharge. Left eye exhibits no discharge.  Neck: Normal range of motion. Neck supple. No JVD present. No tracheal deviation present. No thyromegaly present.  Cardiovascular: Normal rate, regular rhythm and normal heart sounds.   Pulmonary/Chest: No stridor. No respiratory distress. She has no wheezes.  Abdominal: Soft. Bowel sounds are  normal. She exhibits no distension and no mass. There is no tenderness. There is no rebound and no guarding.  Musculoskeletal: She exhibits no edema and no tenderness.  Lymphadenopathy:    She has no cervical adenopathy.  Neurological: She displays normal reflexes. No cranial nerve deficit. She exhibits normal muscle tone. Coordination normal.  Skin: No rash noted. No erythema.  Psychiatric: She has a normal mood and affect. Her behavior is normal. Judgment and thought content normal.   Filed Vitals:   03/22/14 1122  BP: 120/74  Pulse: 82  Temp: 98.6 F (37 C)  Resp: 16   Wt Readings from Last 3 Encounters:  03/22/14 105 lb (47.628 kg)  03/17/14 104 lb 12.8 oz (47.537 kg)  03/02/14 105 lb (47.628 kg)  Gen'l: well nourished, well developed white Woman in no distress HEENT - [place]/AT, EACs/TMs normal, oropharynx with native dentition in good condition, no buccal or palatal lesions, posterior pharynx clear, mucous membranes moist. C&S clear, PERRLA, fundi - normal Neck - supple, no thyromegaly Nodes- negative submental, cervical, supraclavicular regions Chest - no deformity, no CVAT Lungs - clear without rales, wheezes. No increased work of breathing Breast - - Skin normal, nipples w/o discharge, no fixed mass or lesion, no axillary adenopathy. Cardiovascular - regular rate and rhythm, quiet precordium, no murmurs, rubs or gallops, 2+ radial, DP and PT pulses Abdomen - BS+ x 4, no HSM, no guarding or rebound or tenderness Pelvic - NEG/BUS normal, small introitus (used adolescent speculum) - vaginal mucosa normal, retroflexed uterus, PAP scraping performed. Rectal - deferred  Extremities - no clubbing, cyanosis, edema or deformity.  Neuro - A&O x 3, CN II-XII normal, motor strength normal and equal, DTRs 2+ and symmetrical biceps, radial, and patellar tendons. Cerebellar - no tremor, no rigidity, fluid movement and normal gait. Derm - Post prox thigh with three 2 mm eryth papules -  better. No cellulitis  Lab Results  Component Value Date   WBC 5.2 08/20/2013   HGB 13.6 08/20/2013   HCT 39.9 08/20/2013   PLT 206.0 08/20/2013   GLUCOSE 93 08/20/2013   CHOL 182 09/05/2011   TRIG 43.0 09/05/2011   HDL 80.00 09/05/2011   LDLCALC 93 09/05/2011   ALT 23 09/11/2012   AST 20 09/11/2012   NA 138 08/20/2013   K 5.0 08/20/2013   CL 104 08/20/2013   CREATININE 0.8 08/20/2013   BUN 22 08/20/2013   CO2 29 08/20/2013   TSH 1.61 09/05/2011         Assessment & Plan:

## 2014-03-22 NOTE — Assessment & Plan Note (Signed)
Repeat UA [place] abd Urol ref discussed

## 2014-03-22 NOTE — Assessment & Plan Note (Signed)
L>R 2015 Dr Tamala Julian

## 2014-03-23 DIAGNOSIS — R922 Inconclusive mammogram: Secondary | ICD-10-CM | POA: Insufficient documentation

## 2014-03-23 NOTE — Assessment & Plan Note (Signed)
2015 on mammo - WNL Yearly breast exam/mammo Discussed

## 2014-03-25 ENCOUNTER — Encounter: Payer: Self-pay | Admitting: Internal Medicine

## 2014-03-29 ENCOUNTER — Ambulatory Visit
Admission: RE | Admit: 2014-03-29 | Discharge: 2014-03-29 | Disposition: A | Discharge status: Home / Self Care | Payer: BC Managed Care – PPO | Source: Ambulatory Visit | Attending: Internal Medicine | Admitting: Internal Medicine

## 2014-03-30 ENCOUNTER — Ambulatory Visit: Payer: BC Managed Care – PPO

## 2014-03-31 ENCOUNTER — Encounter: Payer: Self-pay | Admitting: Internal Medicine

## 2014-04-20 ENCOUNTER — Encounter: Payer: Self-pay | Admitting: Cardiovascular Disease

## 2014-04-27 ENCOUNTER — Encounter: Payer: Self-pay | Admitting: Physician Assistant

## 2014-04-27 ENCOUNTER — Ambulatory Visit (INDEPENDENT_AMBULATORY_CARE_PROVIDER_SITE_OTHER): Payer: BC Managed Care – PPO | Admitting: Physician Assistant

## 2014-04-27 ENCOUNTER — Telehealth: Payer: Self-pay | Admitting: *Deleted

## 2014-04-27 VITALS — BP 124/70 | HR 73 | Ht 62.0 in | Wt 103.0 lb

## 2014-04-27 DIAGNOSIS — I351 Nonrheumatic aortic (valve) insufficiency: Secondary | ICD-10-CM | POA: Insufficient documentation

## 2014-04-27 DIAGNOSIS — R0600 Dyspnea, unspecified: Secondary | ICD-10-CM

## 2014-04-27 DIAGNOSIS — I493 Ventricular premature depolarization: Secondary | ICD-10-CM

## 2014-04-27 DIAGNOSIS — K219 Gastro-esophageal reflux disease without esophagitis: Secondary | ICD-10-CM | POA: Insufficient documentation

## 2014-04-27 DIAGNOSIS — I071 Rheumatic tricuspid insufficiency: Secondary | ICD-10-CM | POA: Insufficient documentation

## 2014-04-27 DIAGNOSIS — R0609 Other forms of dyspnea: Secondary | ICD-10-CM

## 2014-04-27 DIAGNOSIS — R55 Syncope and collapse: Secondary | ICD-10-CM | POA: Insufficient documentation

## 2014-04-27 DIAGNOSIS — I491 Atrial premature depolarization: Secondary | ICD-10-CM

## 2014-04-27 LAB — CBC
HCT: 39.6 % (ref 36.0–46.0)
Hemoglobin: 13.2 g/dL (ref 12.0–15.0)
MCHC: 33.3 g/dL (ref 30.0–36.0)
MCV: 92.4 fl (ref 78.0–100.0)
PLATELETS: 198 10*3/uL (ref 150.0–400.0)
RBC: 4.29 Mil/uL (ref 3.87–5.11)
RDW: 12.9 % (ref 11.5–15.5)
WBC: 5.7 10*3/uL (ref 4.0–10.5)

## 2014-04-27 NOTE — Telephone Encounter (Signed)
pt notified about lab results with verbal understanding, results sent to [facility] as well.

## 2014-04-27 NOTE — Progress Notes (Addendum)
[place], [address] [place], [place]  [address] Phone: [phone] Fax:  [phone]  Date:  04/27/2014   Patient ID:  [name], [name] 1951-02-20, MRN [id number]   PCP:  [name], MD  Cardiologist:  Angelena Form  History of Present Illness: [name] is a 63 y.o. female with history of mild AI, mild TR, prior vasovagal syncope, palpitations (reported h/o PACs, also had PVCs noted on 2013 event monitor), IBS, GERD who presents for evaluation of dyspnea. Her Epic PMH also notes h/o kidney stones, migraine and upper airway cough syndrome. 2D echo 08/2013: EF 65-70%, no RWMA, normal diastolic function. Mild AI, TR. She is very active, exercising regularly at the gym. In October she had a week of feeling poorly after receiving the flu shot. She was eventually prescribed a ZPak and gradually felt better. Since that time she has had a few occurrences of dyspnea which was unusual for her. One episode happened while doing lunges with kettle bells. Another couple of episodes occurred while talking during exercise. She has, however, been able to uphold her usual level of physical exertion. She wonders if the symptoms occurred because she had been inactive for a period of time during her illness in October. She denies any chest pain, palpitations, LEE, orthopnea, weight changes, nausea, near syncope or syncope. She has h/o microscopic hematuria which is being followed by her PCP.  She is a nonsmoker with no family history of CAD. Lipids are followed by PCP and she has no history of hyperlipidemia. No history of HTN, stroke, or diabetes. She is not tachycardic, tachypnic or hypoxic.  Recent Labs: 08/20/2013: Creatinine 0.8; Hemoglobin 13.6; Potassium 5.0  Wt Readings from Last 3 Encounters:  04/27/14 103 lb (46.72 kg)  03/22/14 105 lb (47.628 kg)  03/17/14 104 lb 12.8 oz (47.537 kg)     Past Medical History  Diagnosis Date  . Mild aortic insufficiency   . Mild tricuspid  regurgitation   . Internal hemorrhoids without mention of complication   . Unspecified constipation   . Irritable bowel syndrome   . Hemorrhage of rectum and anus   . Microscopic hematuria   . Personal history of urinary calculi   . Other plastic surgery for unacceptable cosmetic appearance     Inj tx fllers/expander  . Esophageal reflux   . Postmenopausal atrophic vaginitis   . Backache, unspecified   . Allergic rhinitis, cause unspecified   . Conversion disorder   . Calculus of kidney   . Migraine headache with aura   . Benign fasciculation-cramp syndrome 12/03/2012     Worsening with  stress, fatigue .  [name] Osteopenia   . Premature atrial contractions   . Vasovagal syncope   . GERD (gastroesophageal reflux disease)   . PVC (premature ventricular contraction)     a. Event monitor 2013.    Current Outpatient Prescriptions  Medication Sig Dispense Refill  . ALREX 0.2 % SUSP Apply 0.2 drops to eye as needed.     . Botulinum Toxin Type A, Cosm, (BOTOX COSMETIC) 50 UNITS SOLR Inject 4-6 Units into the muscle. 3 month  injection    . Diclofenac Sodium 2 % SOLN Apply twice daily. 112 g 1  . fluocinonide cream (LIDEX) 4.46 % Apply 1 application topically 2 (two) times daily as needed.     . fluticasone (FLONASE) 50 MCG/ACT nasal spray   5  . FML S.O.P. 0.1 % ophthalmic ointment Place 1 application into the left eye 3 (three)  times daily.     [name] Hyaluronic Acid (RESTYLANE IJ) Inject as directed every 6 (six) months.    . hydrocortisone (ANUSOL-HC) 25 MG suppository Place 1 suppository (25 mg total) rectally at bedtime as needed for hemorrhoids or itching (one per rectum daily as needed for hemorrhoids). 30 suppository 3  . ibuprofen (ADVIL,MOTRIN) 200 MG tablet Take 200 mg by mouth as needed.      . pantoprazole (PROTONIX) 40 MG tablet TAKE 30-60 MIN BEFORE FIRST MEAL OF THE DAY 90 tablet 3  . PREMARIN vaginal cream INSERT 1 GRAM VAGINALLY ONCE A WEEK AS DIRECTED 30 g 1  . triamcinolone  cream (KENALOG) 0.5 % Apply 1 application topically 4 (four) times daily. 15 g 1   No current facility-administered medications for this visit.    Allergies:   Codeine; Epinephrine; Penicillins; and Sulfonamide derivatives   Social History:  The patient  reports that she has never smoked. She has never used smokeless tobacco. She reports that she does not drink alcohol or use illicit drugs.   Family History:  The patient's family history includes COPD in her father; Hyperlipidemia in her father and mother; Hypertension in her father and mother. There is no history of Diabetes, Colon cancer, Breast cancer, Coronary artery disease, or Cancer.   ROS:  Please see the history of present illness.    All other systems reviewed and negative.   PHYSICAL EXAM:  VS:  BP 124/70 mmHg  Pulse 73  Ht 5\' 2"  (1.575 m)  Wt 103 lb (46.72 kg)  BMI 18.83 kg/m2 Well nourished, well developed WF, in no acute distress HEENT: normal Neck: no JVD Cardiac:  normal S1, S2; RRR; no murmur Lungs:  clear to auscultation bilaterally, no wheezing, rhonchi or rales Abd: soft, nontender, no hepatomegaly Ext: no edema Skin: warm and dry Neuro:  moves all extremities spontaneously, no focal abnormalities noted  EKG:  NSR 73bpm, no acute ST-T changes     ASSESSMENT AND PLAN:  1. Dyspnea on exertion - somewhat atypical symptoms. Will check CBC given her h/o microscopic hematuria to exclude a progressive anemia. She has a poverty of coronary risk factors. Valvular disease was mild by echo earlier this year and she does not have significant murmurs on exam. Will plan ETT to risk stratify, which will also help [place] assess BP/HR response to exercise as well as observe for any arrhythmias. If this is normal, suspect her dyspnea was due to the inactivity she required in October due to illness. If symptoms do not improve would consider CXR. 2. Mild valvular heart disease with mild AI, mild TR by echo 08/2013 - by exam this is  stable. Doubt significant progression. Dr. Angelena Form can consider repeat echo in 08/2014 to re-assess. 3. H/o palpitations - reported PACs, also h/o PVCs in 2013 - quiescent. 4. H/o microscopic hematuria - she plans to f/u with PCP for further evaluation. CBC as above.  Dispo: F/u Dr. Angelena Form 08/2014.  Signed, Melina Copa, PA-C  04/27/2014 11:09 AM

## 2014-04-27 NOTE — Patient Instructions (Signed)
Your physician recommends that you continue on your current medications as directed. Please refer to the Current Medication list given to you today.   Your physician recommends that you return for lab work in: [place] recommends that you schedule a follow-up appointment in:  [facility] has requested that you have an exercise tolerance test. For further information please visit [email or web address]. Please also follow instruction sheet, as given.

## 2014-04-28 ENCOUNTER — Other Ambulatory Visit (INDEPENDENT_AMBULATORY_CARE_PROVIDER_SITE_OTHER): Payer: BC Managed Care – PPO

## 2014-04-28 DIAGNOSIS — R312 Other microscopic hematuria: Secondary | ICD-10-CM

## 2014-04-28 DIAGNOSIS — R3129 Other microscopic hematuria: Secondary | ICD-10-CM

## 2014-04-28 LAB — URINALYSIS
Bilirubin Urine: NEGATIVE
KETONES UR: NEGATIVE
LEUKOCYTES UA: NEGATIVE
Nitrite: NEGATIVE
PH: 6.5 (ref 5.0–8.0)
Specific Gravity, Urine: 1.01 (ref 1.000–1.030)
Total Protein, Urine: NEGATIVE
Urine Glucose: NEGATIVE
Urobilinogen, UA: 0.2 (ref 0.0–1.0)

## 2014-04-29 ENCOUNTER — Telehealth: Payer: Self-pay | Admitting: Neurology

## 2014-04-29 ENCOUNTER — Encounter: Payer: Self-pay | Admitting: Cardiovascular Disease

## 2014-04-29 NOTE — Telephone Encounter (Signed)
Patient calling to state that since yesterday, she has noticed that she has been feeling humming and vibrating sensations on her left foot, as if she's had her foot on top of a machine that has been turned on. Please return call and advise.

## 2014-04-30 ENCOUNTER — Telehealth: Payer: Self-pay | Admitting: Cardiovascular Disease

## 2014-04-30 ENCOUNTER — Encounter: Payer: Self-pay | Admitting: Cardiovascular Disease

## 2014-04-30 NOTE — Telephone Encounter (Signed)
Follow Up        Pt calling to reschedule appt exc tolerance test and was told she could have 05/19/14 or 05/26/14 at 3:30 and both of those appts are taken. Please call pt back and advise.

## 2014-04-30 NOTE — Telephone Encounter (Signed)
I spoke to pt and she stated that she has been having since Wednesday transient vibration feeling at bottom of L foot. No pain.  Questioning cause.  Different from previous sensations.

## 2014-04-30 NOTE — Telephone Encounter (Signed)
Thus far, all fasciculations have been benign and last EMG and NCS did not document any abnormality.  Please advise to lay off any caffeine and make a RV appointment, CD

## 2014-04-30 NOTE — Telephone Encounter (Signed)
Spoke with pt. I replied with available appts in my chart message earlier today but these appt spots have now been taken by other patients.  I spoke with pt and gave her this information. She will be out of town from 12/3-12/10 so will keep appt on 12/14.  Pt will call daily to see if any earlier appts become available.   I instructed pt to check on available times at both [facility] and [facility]  Will also ask scheduling to call pt if any cancellations prior to 12/3.

## 2014-05-03 ENCOUNTER — Encounter: Payer: Self-pay | Admitting: Cardiovascular Disease

## 2014-05-03 ENCOUNTER — Telehealth: Payer: Self-pay | Admitting: Cardiovascular Disease

## 2014-05-03 NOTE — Telephone Encounter (Signed)
The patient is trying to get an earlier appt time for her ETT arranged by Melina Copa, PA-C on 04/27/14. Can we check and see if there are any earlier slots for her to get her ETT, maybe at the [facility] office? Thanks, chris

## 2014-05-03 NOTE — Telephone Encounter (Signed)
LMVM for pt of this below.  To make appt and hold caffeine use. She is to call back for appt.

## 2014-05-04 NOTE — Telephone Encounter (Signed)
Spoke with pt. She is aware of scheduled GXT for May 07, 2014 at 3:30 at [facility] office.

## 2014-05-05 ENCOUNTER — Telehealth (HOSPITAL_COMMUNITY): Payer: Self-pay

## 2014-05-05 NOTE — Telephone Encounter (Signed)
I called pt and she did have some chocolate around halloween and since has not had anymore, has not had problem.   She will call back as needed.   No RV at this time.

## 2014-05-05 NOTE — Telephone Encounter (Signed)
Encounter compete. 

## 2014-05-05 NOTE — Telephone Encounter (Signed)
Encounter complete. 

## 2014-05-06 ENCOUNTER — Telehealth: Payer: Self-pay | Admitting: Neurology

## 2014-05-06 NOTE — Telephone Encounter (Signed)
Spoke to pt and she has noted these feelings of vibration in her left foot.  Made appt for RV 05-07-14 0900.

## 2014-05-06 NOTE — Telephone Encounter (Signed)
Pt states that her left foot still has the vibration feelings and she has been following the directions that were given to her from Dr. Brett Fairy.  She is not sure what the next step should be. Please call back and advise.

## 2014-05-07 ENCOUNTER — Ambulatory Visit (HOSPITAL_COMMUNITY)
Admission: RE | Admit: 2014-05-07 | Discharge: 2014-05-07 | Disposition: A | Discharge status: Home / Self Care | Payer: BC Managed Care – PPO | Source: Ambulatory Visit | Attending: Cardiology | Admitting: Cardiology

## 2014-05-07 ENCOUNTER — Encounter: Payer: Self-pay | Admitting: Neurology

## 2014-05-07 ENCOUNTER — Telehealth: Payer: Self-pay | Admitting: Cardiology

## 2014-05-07 ENCOUNTER — Ambulatory Visit (INDEPENDENT_AMBULATORY_CARE_PROVIDER_SITE_OTHER): Payer: BC Managed Care – PPO | Admitting: Neurology

## 2014-05-07 VITALS — BP 103/62 | HR 73 | Resp 14 | Ht 62.0 in | Wt 103.0 lb

## 2014-05-07 DIAGNOSIS — R0609 Other forms of dyspnea: Secondary | ICD-10-CM

## 2014-05-07 DIAGNOSIS — R0789 Other chest pain: Secondary | ICD-10-CM | POA: Diagnosis not present

## 2014-05-07 DIAGNOSIS — R0600 Dyspnea, unspecified: Secondary | ICD-10-CM

## 2014-05-07 DIAGNOSIS — R253 Fasciculation: Secondary | ICD-10-CM

## 2014-05-07 NOTE — Progress Notes (Signed)
[facility]  Provider:  Dr [name] Referring Provider: Alain [name] [name] Lacks, MD Primary Care Physician:  [name] Kehr, MD    HPI:  [name] is a 63 y.o. female here as a referral from Dr. Alain [name] for follow up on her condition of  benign fasciculation.   She has meanwhile reduced or eliminated her caffeine intake and noticed a reduction in twitches. Her results of the EMG and NCV study have helped her to relax and fee safe with her condition.  She is snoring now, which doesn't bother her but her spouse. She has noted a feeling of thumping at the right ear and her ENT suggested that this may be a manifestation of her fasciculations.  Her parents lives in [place], [place]. Close to her sister, her father is in a rehab facility.  Her husband is an Emergency planning/management officer, now with [employer].     Last visit note :  The patient reports that she had 2 spells of near-syncope one in May and one earlier in June of 2014 .  She also has a history of migraines with aura, benign fasciculations which have affected also facial muscles, at times presenting like a tic. The benign fasciculations have been present for over 2 decades and are not related to a minor atrophic lateral sclerosis. Pulse of the near-syncope spells but the patient reported had a diplopia component and appeared to have been experienced as a vertical. The patient reports that the first spell was likely related to dehydration but is not quite sure about the second. The spells don't last long and if she takes fluid and fluid they quickly resolve and do not return. She was sent by her primary care physician for an MRI of the brain which returned normal. She has been seen by Dr. Katy [name]  for the brief dizziness, loss of vision , diplopia.   She is diagnosed of with macular degeneration early stage , myopia, and ocular migraines.  Vision was 20/25 in both eyes, ocular  pressure was 18 in the right, and 19 mm in the left  eye and Dr. Katy [name]  reported that the patient is early cataract formation but can see well. The optic nerves are healthy , but there is a macular drusen in the right eye he recommended ocular vitamins and to continue contact lens use the patient was otherwise found to be in good health ,and he did not impose any restrictions on driving activities of daily living etc. The detailed report is reviewed here today as is her MRI report which was entirely normal.  I explained that migraines can change to an [place] state with reaching the 5th decade of life. Many women that experienced severe nausea and headaches associated with menstrual cycles, has later in life the aura i without  headaches to follow . Is benign fasciculations there is truly not changed there is no treatment available nor is it necessary. Is important for the patient to stay hydrated and could not get hypoglycemic as these will lead to presyncopal spells as well as an aunt who took and frequency rise in fasciculations, and  asked her to restrict caffeine intake    Review of Systems: Out of a complete 14 system review, the patient complains of only the following symptoms, and all other reviewed systems are negative. Less twitching . Snoring .  History   Social History  . Marital Status: Married    Spouse Name: [name]    Number of Children: 0  .  Years of Education: 14   Occupational History  . OFFICE liasion   . [employer]   . [employer]    Social History Main Topics  . Smoking status: Never Smoker   . Smokeless tobacco: Never Used  . Alcohol Use: No  . Drug Use: No  . Sexual Activity: Not on file   Other Topics Concern  . Not on file   Social History Narrative   [employer]  in [place]. Married '90 - marriage is in very good shape '12, No children.  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 Offerred medical services for them  if needed (Nov '11)   Patient is married [name]) and lives at home with her husband.   Patient is working Administrator, arts work.   Patient has a high school education.   Patient is right-handed.   Patient does not drink any caffeine.          Family History  Problem Relation Age of Onset  . Hyperlipidemia Father   . COPD Father     Smoker  . Hyperlipidemia Mother   . Diabetes Neg Hx   . Colon cancer Neg Hx   . Breast cancer Neg Hx   . Coronary artery disease Neg Hx   . Cancer Neg Hx     breast or colon  . Hypertension Father   . Hypertension Mother     Past Medical History  Diagnosis Date  . Mild aortic insufficiency   . Mild tricuspid regurgitation   . Internal hemorrhoids without mention of complication   . Unspecified constipation   . Irritable bowel syndrome   . Hemorrhage of rectum and anus   . Microscopic hematuria   . Personal history of urinary calculi   . Other plastic surgery for unacceptable cosmetic appearance     Inj tx fllers/expander  . Esophageal reflux   . Postmenopausal atrophic vaginitis   . Backache, unspecified   . Allergic rhinitis, cause unspecified   . Conversion disorder   . Calculus of kidney   . Migraine headache with aura   . Benign fasciculation-cramp syndrome 12/03/2012     Worsening with  stress, fatigue .  [name] Osteopenia   . Premature atrial contractions   . Vasovagal syncope   . GERD (gastroesophageal reflux disease)   . PVC (premature ventricular contraction)     a. Event monitor 2013.    Past Surgical History  Procedure Laterality Date  . Cosmetic procedures w/injection therapy    . Wisdom tooth extraction    . Breast biopsy Right     Current Outpatient Prescriptions  Medication Sig Dispense Refill  . ALREX 0.2 % SUSP Apply 0.2 drops to eye as needed.     . Botulinum Toxin Type A, Cosm, (BOTOX COSMETIC) 50 UNITS SOLR Inject 4-6 Units into the muscle. 3 month  injection    . Diclofenac Sodium 2 % SOLN Apply twice  daily. 112 g 1  . fluocinonide cream (LIDEX) 6.44 % Apply 1 application topically 2 (two) times daily as needed.     . fluticasone (FLONASE) 50 MCG/ACT nasal spray   5  . FML S.O.P. 0.1 % ophthalmic ointment Place 1 application into the left eye 3 (three) times daily.     [name] Hyaluronic Acid (RESTYLANE IJ) Inject as directed every 6 (six) months.    . hydrocortisone (ANUSOL-HC) 25 MG suppository Place 1 suppository (25  mg total) rectally at bedtime as needed for hemorrhoids or itching (one per rectum daily as needed for hemorrhoids). 30 suppository 3  . ibuprofen (ADVIL,MOTRIN) 200 MG tablet Take 200 mg by mouth as needed.      . pantoprazole (PROTONIX) 40 MG tablet TAKE 30-60 MIN BEFORE FIRST MEAL OF THE DAY 90 tablet 3  . PREMARIN vaginal cream INSERT 1 GRAM VAGINALLY ONCE A WEEK AS DIRECTED 30 g 1  . tretinoin (RETIN-A) 0.05 % cream Apply 1 application topically at bedtime.    . triamcinolone cream (KENALOG) 0.5 % Apply 1 application topically 4 (four) times daily. 15 g 1   No current facility-administered medications for this visit.    Allergies as of 05/07/2014 - Review Complete 05/07/2014  Allergen Reaction Noted  . Codeine Other (See Comments)   . Epinephrine  08/12/2012  . Penicillins Other (See Comments)   . Sulfonamide derivatives Other (See Comments)     Vitals: BP 103/62 mmHg  Pulse 73  Resp 14  Ht 5\' 2"  (1.575 m)  Wt 103 lb (46.72 kg)  BMI 18.83 kg/m2 Last Weight:  Wt Readings from Last 1 Encounters:  05/07/14 103 lb (46.72 kg)   Last Height:   Ht Readings from Last 1 Encounters:  05/07/14 5\' 2"  (1.575 m)   Vision Screening:  Left eye with correction 25/20.  Right eye with correction 25/20. Monovision, contacts.   Physical exam:  General: The patient is awake, alert and appears not in acute distress. The patient is well groomed. Head: Normocephalic, atraumatic. Neck is supple. Mallampati 1- wide open-  neck circumference 12.5 inches, No retrognathia. TMJ click on  the right.  Cardiovascular:  Regular rate and rhythm, without  murmurs or carotid bruit, and without distended neck veins. Respiratory: Lungs are clear to auscultation. Skin:  Without evidence of edema, or rash Trunk: BMI low normal.  Neurologic exam : The patient is awake and alert, oriented to place and time.   Memory subjective described as intact. There is a normal attention span & concentration ability.  Speech is fluent without  dysarthria, dysphonia or aphasia. Mood and affect are appropriate.  Cranial nerves: Pupils are equal and briskly reactive to light. Extraocular movements  in vertical and horizontal planes intact and without nystagmus. Hearing to finger rub intact.   Facial sensation intact to fine touch. Facial motor strength is symmetric and tongue and uvula move midline.  Motor exam: Normal tone and normal muscle bulk and symmetric normal strength in all extremities.  Sensory:  Fine touch, pinprick and vibration were tested in all extremities.  Proprioception is tested in the upper extremities only. This was normal.  Coordination: Rapid alternating movements in the fingers/hands isnormal.  Finger-to-nose maneuver tested and normal without evidence of ataxia, dysmetria or tremor.  Gait and station: Patient walks without assistive device.  Strength within normal limits. Stance is stable and normal.  Tandem gait isunfragmented. Romberg testing normal.  Deep tendon reflexes: in the upper and lower extremities are symmetric and intact.    Assessment:  After physical and neurologic examination, review of laboratory studies, imaging, neurophysiology testing and pre-existing records. - benign fasciculation.   Benign fasciculations may also affect facial muscles as well at extraocular movements.  Her vibratory feeling in her feet is temporarily and related to the morning hours a or when sitting still at her desk.

## 2014-05-07 NOTE — Procedures (Signed)
Exercise Treadmill Test Test  Exercise Tolerance Test Ordering MD: Lauree Chandler, MD  Interpreting MD:   Unique Test No: 1  Treadmill:  1  Indication for ETT: exertional dyspnea  Contraindication to ETT: No   Stress Modality: exercise - treadmill  Cardiac Imaging Performed: non   Protocol: standard Bruce - maximal  Max BP:  163/83  Max MPHR (bpm): 159 85% MPR (bpm):  135  MPHR obtained (bpm):  150 % MPHR obtained:  94  Reached 85% MPHR (min:sec): 9:45 Total Exercise Time (min-sec):10:02  Workload in METS:  11.70 Borg Scale:   Reason ETT Terminated:  dyspnea, fatigue    ST Segment Analysis At Rest: NSR, RAD, no ST changes. With Exercise: no evidence of significant ST depression  Other Information Arrhythmia:  No Angina during ETT:  absent (0) Quality of ETT:  diagnostic  ETT Interpretation:  normal - no evidence of ischemia by ST analysis  Comments: ETT with good exercise tolerance (10:02); normal BP response; no chest pain; no ST changes; negative adequate ETT.  Kirk Ruths

## 2014-05-07 NOTE — Telephone Encounter (Signed)
Called for ETT results, she had mised call from the office and thought something was wrong.  Results of stress test given and pt reassured.

## 2014-05-10 ENCOUNTER — Telehealth: Payer: Self-pay | Admitting: *Deleted

## 2014-05-10 NOTE — Telephone Encounter (Signed)
pt notified of stress test results with verbal understanding. Pt aware if dyspnea continues to then f/u w/PCP. Pt said ok and thank you for the great news.

## 2014-05-11 ENCOUNTER — Other Ambulatory Visit: Payer: Self-pay | Admitting: Dermatology

## 2014-05-19 ENCOUNTER — Telehealth: Payer: Self-pay | Admitting: Internal Medicine

## 2014-05-19 ENCOUNTER — Encounter: Payer: Self-pay | Admitting: Internal Medicine

## 2014-05-19 NOTE — Telephone Encounter (Signed)
Pt is requesting, Alprazolam .05 refill as Dr [name] has written for her in the past, pt sent Dr [name] and email. Pls advise.

## 2014-05-20 ENCOUNTER — Other Ambulatory Visit: Payer: Self-pay | Admitting: Internal Medicine

## 2014-05-20 MED ORDER — ALPRAZOLAM 0.5 MG PO TABS: 0.5000 mg | ORAL_TABLET | Freq: Two times a day (BID) | ORAL | Status: DC | PRN

## 2014-05-20 NOTE — Telephone Encounter (Signed)
I gave a Rx to [facility] to fax Thx

## 2014-05-20 NOTE — Telephone Encounter (Signed)
Rx faxed. Pt informed.

## 2014-05-20 NOTE — Telephone Encounter (Signed)
Patient called in.  States she got an ok from [facility] on [address].  She needs a script sent before this afternoon b/c she is going out of town.  Patient uses [facility] at [address].

## 2014-05-31 ENCOUNTER — Encounter: Payer: BC Managed Care – PPO | Admitting: Physician Assistant

## 2014-06-17 ENCOUNTER — Telehealth: Payer: Self-pay | Admitting: Internal Medicine

## 2014-06-17 ENCOUNTER — Encounter: Payer: Self-pay | Admitting: Internal Medicine

## 2014-06-17 ENCOUNTER — Ambulatory Visit (INDEPENDENT_AMBULATORY_CARE_PROVIDER_SITE_OTHER): Payer: BC Managed Care – PPO | Admitting: Internal Medicine

## 2014-06-17 VITALS — BP 110/82 | HR 76 | Temp 98.5°F | Ht 62.0 in | Wt 104.0 lb

## 2014-06-17 DIAGNOSIS — F4321 Adjustment disorder with depressed mood: Secondary | ICD-10-CM | POA: Insufficient documentation

## 2014-06-17 NOTE — Telephone Encounter (Signed)
States Dr. [name] was to give her a name of someone within [place] that she could talk with?

## 2014-06-17 NOTE — Telephone Encounter (Signed)
Pt informed

## 2014-06-17 NOTE — Assessment & Plan Note (Signed)
Father passed away from sepsis at 61 yo acinetobacter sepsis

## 2014-06-17 NOTE — Patient Instructions (Signed)
Grief Reaction Grief is a normal response to the death of someone close to you. Feelings of fear, anger, and guilt can affect almost everyone who loses someone they love. Symptoms of depression are also common. These include problems with sleep, loss of appetite, and lack of energy. These grief reaction symptoms often last for weeks to months after a loss. They may also return during special times that remind you of the person you lost, such as an anniversary or birthday. Anxiety, insomnia, irritability, and deep depression may last beyond the period of normal grief. If you experience these feelings for 6 months or longer, you may have clinical depression. Clinical depression requires further medical attention. If you think that you have clinical depression, you should contact your caregiver. If you have a history of depression or a family history of depression, you are at greater risk of clinical depression. You are also at greater risk of developing clinical depression if the loss was traumatic or the loss was of someone with whom you had unresolved issues.  A grief reaction can become complicated by being blocked. This means being unable to cry or express extreme emotions. This may prolong the grieving period and worsen the emotional effects of the loss. Mourning is a natural event in human life. A healthy grief reaction is one that is not blocked. It requires a time of sadness and readjustment. It is very important to share your sorrow and fear with others, especially close friends and family. Professional counselors and clergy can also help you process your grief. Document Released: 06/04/2005 Document Revised: 10/19/2013 Document Reviewed: 02/12/2006 [facility] Patient Information 2015 [place], [place]. This information is not intended to replace advice given to you by your health care provider. Make sure you discuss any questions you have with your health care provider.

## 2014-06-17 NOTE — Progress Notes (Signed)
Subjective:     HPI  Father passed away from sepsis at 63 yo acinetobacter sepsis  C/o grief, stress, HAs  F/u LBP, dense breasts on mammo, microhematuria - not new   Past Medical History  Diagnosis Date  . Mild aortic insufficiency   . Mild tricuspid regurgitation   . Internal hemorrhoids without mention of complication   . Unspecified constipation   . Irritable bowel syndrome   . Hemorrhage of rectum and anus   . Microscopic hematuria   . Personal history of urinary calculi   . Other plastic surgery for unacceptable cosmetic appearance     Inj tx fllers/expander  . Esophageal reflux   . Postmenopausal atrophic vaginitis   . Backache, unspecified   . Allergic rhinitis, cause unspecified   . Conversion disorder   . Calculus of kidney   . Migraine headache with aura   . Benign fasciculation-cramp syndrome 12/03/2012     Worsening with  stress, fatigue .  [name] Osteopenia   . Premature atrial contractions   . Vasovagal syncope   . GERD (gastroesophageal reflux disease)   . PVC (premature ventricular contraction)     a. Event monitor 2013.   Past Surgical History  Procedure Laterality Date  . Cosmetic procedures w/injection therapy    . Wisdom tooth extraction    . Breast biopsy Right    Family History  Problem Relation Age of Onset  . Hyperlipidemia Father   . COPD Father     Smoker  . Hyperlipidemia Mother   . Diabetes Neg Hx   . Colon cancer Neg Hx   . Breast cancer Neg Hx   . Coronary artery disease Neg Hx   . Cancer Neg Hx     breast or colon  . Hypertension Father   . Hypertension Mother    History   Social History  . Marital Status: Married    Spouse Name: [name]    Number of Children: 0  . Years of Education: 14   Occupational History  . OFFICE liasion   . [employer]   . [employer]    Social History Main Topics  . Smoking status: Never Smoker   . Smokeless tobacco: Never Used  . Alcohol Use: No  . Drug Use: No  . Sexual  Activity: Not on file   Other Topics Concern  . Not on file   Social History Narrative   [employer]  in [place]. Married '90 - marriage is in very good shape '12, No children.  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 [facility] medical services for them if needed (Nov '11)   Patient is married [name]) and lives at home with her husband.   Patient is working Administrator, arts work.   Patient has a high school education.   Patient is right-handed.   Patient does not drink any caffeine.          Current Outpatient Prescriptions on File Prior to Visit  Medication Sig Dispense Refill  . ALPRAZolam (XANAX) 0.5 MG tablet Take 1 tablet (0.5 mg total) by mouth 2 (two) times daily as needed for anxiety. 30 tablet 1  . ALREX 0.2 % SUSP Apply 0.2 drops to eye as needed.     . Botulinum Toxin Type A, Cosm, (BOTOX COSMETIC) 50 UNITS SOLR Inject 4-6 Units into the muscle. 3 month  injection    . Diclofenac Sodium  2 % SOLN Apply twice daily. 112 g 1  . fluocinonide cream (LIDEX) 7.32 % Apply 1 application topically 2 (two) times daily as needed.     . fluticasone (FLONASE) 50 MCG/ACT nasal spray   5  . FML S.O.P. 0.1 % ophthalmic ointment Place 1 application into the left eye 3 (three) times daily.     [name] Hyaluronic Acid (RESTYLANE IJ) Inject as directed every 6 (six) months.    . hydrocortisone (ANUSOL-HC) 25 MG suppository Place 1 suppository (25 mg total) rectally at bedtime as needed for hemorrhoids or itching (one per rectum daily as needed for hemorrhoids). 30 suppository 3  . ibuprofen (ADVIL,MOTRIN) 200 MG tablet Take 200 mg by mouth as needed.      . pantoprazole (PROTONIX) 40 MG tablet TAKE 30-60 MIN BEFORE FIRST MEAL OF THE DAY 90 tablet 3  . PREMARIN vaginal cream INSERT 1 GRAM VAGINALLY ONCE A WEEK AS DIRECTED 30 g 1  . tretinoin (RETIN-A) 0.05 % cream Apply 1 application topically at bedtime.    .  triamcinolone cream (KENALOG) 0.5 % Apply 1 application topically 4 (four) times daily. 15 g 1   No current facility-administered medications on file prior to visit.      Review of Systems  Constitutional: Negative for activity change, appetite change, fatigue and unexpected weight change.  HENT: Negative for congestion, mouth sores and sinus pressure.   Eyes: Negative for visual disturbance.  Respiratory: Negative for chest tightness.   Gastrointestinal: Negative for nausea.  Genitourinary: Negative for frequency, difficulty urinating and vaginal pain.  Musculoskeletal: Negative for back pain and gait problem.  Skin: Negative for pallor and rash.  Neurological: Negative for dizziness, tremors, weakness and numbness.  Psychiatric/Behavioral: Negative for confusion and sleep disturbance.     Subjective:     HPI  F/u LBP, dense breasts on mammo, microhematuria - not new   Past Medical History  Diagnosis Date  . Mild aortic insufficiency   . Mild tricuspid regurgitation   . Internal hemorrhoids without mention of complication   . Unspecified constipation   . Irritable bowel syndrome   . Hemorrhage of rectum and anus   . Microscopic hematuria   . Personal history of urinary calculi   . Other plastic surgery for unacceptable cosmetic appearance     Inj tx fllers/expander  . Esophageal reflux   . Postmenopausal atrophic vaginitis   . Backache, unspecified   . Allergic rhinitis, cause unspecified   . Conversion disorder   . Calculus of kidney   . Migraine headache with aura   . Benign fasciculation-cramp syndrome 12/03/2012     Worsening with  stress, fatigue .  [name] Osteopenia   . Premature atrial contractions   . Vasovagal syncope   . GERD (gastroesophageal reflux disease)   . PVC (premature ventricular contraction)     a. Event monitor 2013.   Past Surgical History  Procedure Laterality Date  . Cosmetic procedures w/injection therapy    . Wisdom tooth extraction     . Breast biopsy Right    Family History  Problem Relation Age of Onset  . Hyperlipidemia Father   . COPD Father     Smoker  . Hyperlipidemia Mother   . Diabetes Neg Hx   . Colon cancer Neg Hx   . Breast cancer Neg Hx   . Coronary artery disease Neg Hx   . Cancer Neg Hx     breast or colon  . Hypertension Father   .  Hypertension Mother    History   Social History  . Marital Status: Married    Spouse Name: [name]    Number of Children: 0  . Years of Education: 14   Occupational History  . OFFICE liasion   . [employer]   . [employer]    Social History Main Topics  . Smoking status: Never Smoker   . Smokeless tobacco: Never Used  . Alcohol Use: No  . Drug Use: No  . Sexual Activity: Not on file   Other Topics Concern  . Not on file   Social History Narrative   [employer]  in [place]. Married '90 - marriage is in very good shape '12, No children.  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 [facility] medical services for them if needed (Nov '11)   Patient is married [name]) and lives at home with her husband.   Patient is working Administrator, arts work.   Patient has a high school education.   Patient is right-handed.   Patient does not drink any caffeine.          Current Outpatient Prescriptions on File Prior to Visit  Medication Sig Dispense Refill  . ALPRAZolam (XANAX) 0.5 MG tablet Take 1 tablet (0.5 mg total) by mouth 2 (two) times daily as needed for anxiety. 30 tablet 1  . ALREX 0.2 % SUSP Apply 0.2 drops to eye as needed.     . Botulinum Toxin Type A, Cosm, (BOTOX COSMETIC) 50 UNITS SOLR Inject 4-6 Units into the muscle. 3 month  injection    . Diclofenac Sodium 2 % SOLN Apply twice daily. 112 g 1  . fluocinonide cream (LIDEX) 2.67 % Apply 1 application topically 2 (two) times daily as needed.     . fluticasone (FLONASE) 50 MCG/ACT nasal spray   5  . FML  S.O.P. 0.1 % ophthalmic ointment Place 1 application into the left eye 3 (three) times daily.     [name] Hyaluronic Acid (RESTYLANE IJ) Inject as directed every 6 (six) months.    . hydrocortisone (ANUSOL-HC) 25 MG suppository Place 1 suppository (25 mg total) rectally at bedtime as needed for hemorrhoids or itching (one per rectum daily as needed for hemorrhoids). 30 suppository 3  . ibuprofen (ADVIL,MOTRIN) 200 MG tablet Take 200 mg by mouth as needed.      . pantoprazole (PROTONIX) 40 MG tablet TAKE 30-60 MIN BEFORE FIRST MEAL OF THE DAY 90 tablet 3  . PREMARIN vaginal cream INSERT 1 GRAM VAGINALLY ONCE A WEEK AS DIRECTED 30 g 1  . tretinoin (RETIN-A) 0.05 % cream Apply 1 application topically at bedtime.    . triamcinolone cream (KENALOG) 0.5 % Apply 1 application topically 4 (four) times daily. 15 g 1   No current facility-administered medications on file prior to visit.      Review of Systems  Constitutional: Negative for activity change, appetite change, fatigue and unexpected weight change.  HENT: Negative for congestion, mouth sores and sinus pressure.   Eyes: Negative for visual disturbance.  Respiratory: Negative for chest tightness.   Gastrointestinal: Negative for nausea.  Genitourinary: Negative for frequency, difficulty urinating and vaginal pain.  Musculoskeletal: Negative for back pain and gait problem.  Skin: Negative for pallor and rash.  Neurological: Negative for dizziness, tremors, weakness and numbness.  Psychiatric/Behavioral: Negative for confusion and sleep disturbance.   Constitutional:  Negative for fever, chills, activity  change and unexpected weight change.  HEENT:  Negative for hearing loss, ear pain, congestion, neck stiffness and postnasal drip. Negative for sore throat or swallowing problems. Negative for dental complaints.   Eyes: Negative for vision loss or change in visual acuity.  Respiratory: Negative for chest tightness and wheezing. Negative for DOE.    Cardiovascular: Negative for chest pain or palpitations. No decreased exercise tolerance Gastrointestinal: No change in bowel habit. No bloating or gas. No reflux or indigestion Genitourinary: Negative for urgency, frequency, flank pain and difficulty urinating.  Musculoskeletal: Negative for myalgias, back pain, arthralgias and gait problem.  Neurological: Negative for dizziness, tremors, weakness and headaches.  Hematological: Negative for adenopathy.  Psychiatric/Behavioral: Negative for behavioral problems and dysphoric mood.       Objective:   Physical Exam  Constitutional: She appears well-developed. No distress.  HENT:  Head: Normocephalic.  Right Ear: External ear normal.  Left Ear: External ear normal.  Nose: Nose normal.  Mouth/Throat: Oropharynx is clear and moist.  Eyes: Conjunctivae are normal. Pupils are equal, round, and reactive to light. Right eye exhibits no discharge. Left eye exhibits no discharge.  Neck: Normal range of motion. Neck supple. No JVD present. No tracheal deviation present. No thyromegaly present.  Cardiovascular: Normal rate, regular rhythm and normal heart sounds.   Pulmonary/Chest: No stridor. No respiratory distress. She has no wheezes.  Abdominal: Soft. Bowel sounds are normal. She exhibits no distension and no mass. There is no tenderness. There is no rebound and no guarding.  Musculoskeletal: She exhibits no edema and no tenderness.  Lymphadenopathy:    She has no cervical adenopathy.  Neurological: She displays normal reflexes. No cranial nerve deficit. She exhibits normal muscle tone. Coordination normal.  Skin: No rash noted. No erythema.  Psychiatric: She has a normal mood and affect. Her behavior is normal. Judgment and thought content normal.   Filed Vitals:   06/17/14 1156  BP: 110/82  Pulse: 76  Temp: 98.5 F (36.9 C)   Wt Readings from Last 3 Encounters:  06/17/14 104 lb (47.174 kg)  05/07/14 103 lb (46.72 kg)  04/27/14 103  lb (46.72 kg)   Gen'l: well nourished, well developed white Woman in no distress HEENT - [place]/AT, EACs/TMs normal, oropharynx with native dentition in good condition, no buccal or palatal lesions, posterior pharynx clear, mucous membranes moist. C&S clear, PERRLA, fundi - normal Neck - supple, no thyromegaly Nodes- negative submental, cervical, supraclavicular regions Chest - no deformity, no CVAT Lungs - clear without rales, wheezes. No increased work of breathing Breast - - Skin normal, nipples w/o discharge, no fixed mass or lesion, no axillary adenopathy. Cardiovascular - regular rate and rhythm, quiet precordium, no murmurs, rubs or gallops, 2+ radial, DP and PT pulses Abdomen - BS+ x 4, no HSM, no guarding or rebound or tenderness Pelvic - NEG/BUS normal, small introitus (used adolescent speculum) - vaginal mucosa normal, retroflexed uterus, PAP scraping performed. Rectal - deferred  Extremities - no clubbing, cyanosis, edema or deformity.  Neuro - A&O x 3, CN II-XII normal, motor strength normal and equal, DTRs 2+ and symmetrical biceps, radial, and patellar tendons. Cerebellar - no tremor, no rigidity, fluid movement and normal gait. Derm - Post prox thigh with three 2 mm eryth papules - better. No cellulitis  Lab Results  Component Value Date   WBC 5.7 04/27/2014   HGB 13.2 04/27/2014   HCT 39.6 04/27/2014   PLT 198.0 04/27/2014   GLUCOSE 93 08/20/2013   CHOL 182  09/05/2011   TRIG 43.0 09/05/2011   HDL 80.00 09/05/2011   LDLCALC 93 09/05/2011   ALT 23 09/11/2012   AST 20 09/11/2012   NA 138 08/20/2013   K 5.0 08/20/2013   CL 104 08/20/2013   CREATININE 0.8 08/20/2013   BUN 22 08/20/2013   CO2 29 08/20/2013   TSH 1.61 09/05/2011         Assessment & Plan:   Constitutional:  Negative for fever, chills, activity change and unexpected weight change.  HEENT:  Negative for hearing loss, ear pain, congestion, neck stiffness and postnasal drip. Negative for sore throat or  swallowing problems. Negative for dental complaints.   Eyes: Negative for vision loss or change in visual acuity.  Respiratory: Negative for chest tightness and wheezing. Negative for DOE.   Cardiovascular: Negative for chest pain or palpitations. No decreased exercise tolerance Gastrointestinal: No change in bowel habit. No bloating or gas. No reflux or indigestion Genitourinary: Negative for urgency, frequency, flank pain and difficulty urinating.  Musculoskeletal: Negative for myalgias, back pain, arthralgias and gait problem.  Neurological: Negative for dizziness, tremors, weakness and headaches.  Hematological: Negative for adenopathy.  Psychiatric/Behavioral: Negative for behavioral problems and dysphoric mood.       Objective:   Physical Exam Filed Vitals:   06/17/14 1156  BP: 110/82  Pulse: 76  Temp: 98.5 F (36.9 C)   Wt Readings from Last 3 Encounters:  06/17/14 104 lb (47.174 kg)  05/07/14 103 lb (46.72 kg)  04/27/14 103 lb (46.72 kg)   Gen'l: well nourished, well developed white Woman in no distress HEENT - [place]/AT, EACs/TMs normal, oropharynx with native dentition in good condition, no buccal or palatal lesions, posterior pharynx clear, mucous membranes moist. C&S clear, PERRLA, fundi - normal Neck - supple, no thyromegaly Nodes- negative submental, cervical, supraclavicular regions Chest - no deformity, no CVAT Lungs - clear without rales, wheezes. No increased work of breathing Breast - - Skin normal, nipples w/o discharge, no fixed mass or lesion, no axillary adenopathy. Cardiovascular - regular rate and rhythm, quiet precordium, no murmurs, rubs or gallops, 2+ radial, DP and PT pulses Abdomen - BS+ x 4, no HSM, no guarding or rebound or tenderness Pelvic - NEG/BUS normal, small introitus (used adolescent speculum) - vaginal mucosa normal, retroflexed uterus, PAP scraping performed. Rectal - deferred  Extremities - no clubbing, cyanosis, edema or deformity.  Neuro  - A&O x 3, CN II-XII normal, motor strength normal and equal, DTRs 2+ and symmetrical biceps, radial, and patellar tendons. Cerebellar - no tremor, no rigidity, fluid movement and normal gait. Derm - Post prox thigh with three 2 mm eryth papules - better. No cellulitis  Lab Results  Component Value Date   WBC 5.7 04/27/2014   HGB 13.2 04/27/2014   HCT 39.6 04/27/2014   PLT 198.0 04/27/2014   GLUCOSE 93 08/20/2013   CHOL 182 09/05/2011   TRIG 43.0 09/05/2011   HDL 80.00 09/05/2011   LDLCALC 93 09/05/2011   ALT 23 09/11/2012   AST 20 09/11/2012   NA 138 08/20/2013   K 5.0 08/20/2013   CL 104 08/20/2013   CREATININE 0.8 08/20/2013   BUN 22 08/20/2013   CO2 29 08/20/2013   TSH 1.61 09/05/2011         Assessment & Plan:

## 2014-06-17 NOTE — Telephone Encounter (Signed)
Option 1: call [facility] Option 2 : sch appt w/Dr Cheryln Manly w/[facility] (I'll refer) Thx

## 2014-06-20 ENCOUNTER — Encounter: Payer: Self-pay | Admitting: Internal Medicine

## 2014-06-20 NOTE — Progress Notes (Signed)
   Subjective:    HPI  Father passed away from sepsis at 62 yo acinetobacter sepsis  C/o grief, stress, HAs  F/u LBP, dense breasts on mammo, microhematuria - not new     Review of Systems  Constitutional: Negative for chills, activity change, appetite change, fatigue and unexpected weight change.  HENT: Negative for congestion, mouth sores and sinus pressure.   Eyes: Negative for visual disturbance.  Respiratory: Negative for cough and chest tightness.   Gastrointestinal: Negative for nausea and abdominal pain.  Genitourinary: Negative for frequency, difficulty urinating and vaginal pain.  Musculoskeletal: Negative for back pain and gait problem.  Skin: Negative for pallor and rash.  Neurological: Negative for dizziness, tremors, weakness, numbness and headaches.  Psychiatric/Behavioral: Positive for sleep disturbance. Negative for suicidal ideas and confusion. The patient is nervous/anxious.        Objective:   Physical Exam  Constitutional: She appears well-developed. No distress.  HENT:  Head: Normocephalic.  Right Ear: External ear normal.  Left Ear: External ear normal.  Nose: Nose normal.  Mouth/Throat: Oropharynx is clear and moist.  Eyes: Conjunctivae are normal. Pupils are equal, round, and reactive to light. Right eye exhibits no discharge. Left eye exhibits no discharge.  Neck: Normal range of motion. Neck supple. No JVD present. No tracheal deviation present. No thyromegaly present.  Cardiovascular: Normal rate, regular rhythm and normal heart sounds.   Pulmonary/Chest: No stridor. No respiratory distress. She has no wheezes.  Abdominal: Soft. Bowel sounds are normal. She exhibits no distension and no mass. There is no tenderness. There is no rebound and no guarding.  Musculoskeletal: She exhibits no edema or tenderness.  Lymphadenopathy:    She has no cervical adenopathy.  Neurological: She displays normal reflexes. No cranial nerve deficit. She exhibits  normal muscle tone. Coordination normal.  Skin: No rash noted. No erythema.  Psychiatric: Her behavior is normal. Judgment and thought content normal.  sad         Assessment & Plan:

## 2014-07-07 ENCOUNTER — Telehealth: Payer: Self-pay | Admitting: Cardiovascular Disease

## 2014-07-07 NOTE — Telephone Encounter (Signed)
New problem    Pt want to know last time she had an Echo and also stated does she need to have one b/f her visit in March. Please advise pt

## 2014-07-07 NOTE — Telephone Encounter (Signed)
Spoke with pt and told her last echo was 09/07/13. Per Dayna Dunn's office from November 2015 exam stable and timing of repeat echo to be determined by Dr. Angelena Form.  Pt reports dyspnea on exertion less noticeable. Reports primary care noted skipped beats at last office visit. Pt is scheduled to see Dr. Angelena Form on August 30, 2014.  Will send to Dr. Angelena Form to see if pt needs echo prior to this appt

## 2014-07-07 NOTE — Telephone Encounter (Signed)
No indication for echo before I see her in march. Thanks, chris

## 2014-07-07 NOTE — Telephone Encounter (Signed)
Spoke with pt and gave her this information.  

## 2014-07-27 ENCOUNTER — Telehealth: Payer: Self-pay | Admitting: Internal Medicine

## 2014-07-27 NOTE — Telephone Encounter (Signed)
Dr. Henrene Pastor had given her a PPI taper in the past and was inquiring what it was.  Per the office note on 01/21/2014 take 20 mg pantoprazole for 1 month, 20 mg every other day for 1 month, then every third day for a few weeks the stop  All questions answered.  She will call back for any additional questions or concerns

## 2014-07-27 NOTE — Telephone Encounter (Signed)
Left message for patient to call back  

## 2014-07-30 ENCOUNTER — Ambulatory Visit (INDEPENDENT_AMBULATORY_CARE_PROVIDER_SITE_OTHER): Payer: BLUE CROSS/BLUE SHIELD | Admitting: Family Medicine

## 2014-07-30 ENCOUNTER — Encounter: Payer: Self-pay | Admitting: Family Medicine

## 2014-07-30 VITALS — BP 112/64 | HR 69 | Ht 62.0 in | Wt 104.0 lb

## 2014-07-30 DIAGNOSIS — M6248 Contracture of muscle, other site: Secondary | ICD-10-CM

## 2014-07-30 DIAGNOSIS — M62838 Other muscle spasm: Secondary | ICD-10-CM | POA: Insufficient documentation

## 2014-07-30 NOTE — Progress Notes (Signed)
Pre visit review using our clinic review tool, if applicable. No additional management support is needed unless otherwise documented below in the visit note. 

## 2014-07-30 NOTE — Progress Notes (Signed)
  [name] [name],  [address] Phone: [phone] Subjective:     CC: neck pain  GTX:[id number] [name] is a 64 y.o. female coming in with complaint of neck pain. This is a new problem for patient. Patient statesthis is been going on for quite some time. Reviewing patient's chart patient did have cervical x-rays back in December 2011 showed mild degenerative disc disease at C4-C5, C5-6. Patient states she does not remember anything except for extending her neck and having a sharp pain approximately 3 days ago. Patient states that it is improving slowly. Denies any radiation down the arms or any numbness or weakness. Patient states this seems to be localized.  States that it only happens when she extends her neck at this time it seems like. Rates severity of pain is 4 out of 10. Has taken ibuprofen. Does not stop her from daily activities.     Past medical history, social, surgical and family history all reviewed in electronic medical record.   Review of Systems: No headache, visual changes, nausea, vomiting, diarrhea, constipation, dizziness, abdominal pain, skin rash, fevers, chills, night sweats, weight loss, swollen lymph nodes, body aches, joint swelling, muscle aches, chest pain, shortness of breath, mood changes.   Objective Blood pressure 112/64, pulse 69, height 5\' 2"  (1.575 m), weight 104 lb (47.174 kg), SpO2 98 %.  General: No apparent distress alert and oriented x3 mood and affect normal, dressed appropriately.  HEENT: Pupils equal, extraocular movements intact  Respiratory: Patient's speak in full sentences and does not appear short of breath  Cardiovascular: No lower extremity edema, non tender, no erythema  Skin: Warm dry intact with no signs of infection or rash on extremities or on axial skeleton.  Abdomen: Soft nontender  Neuro: Cranial nerves II through XII are intact, neurovascularly intact in all extremities  with 2+ DTRs and 2+ pulses.  Lymph: No lymphadenopathy of posterior or anterior cervical chain or axillae bilaterally.  Gait normal with good balance and coordination.  MSK:  Non tender with full range of motion and good stability and symmetric strength and tone of shoulders, elbows, wrist, hip, knee and ankles bilaterally.  Neck: Inspection unremarkable. No palpable stepoffs. Negative Spurling's maneuver. Full neck range of motion mild tightness of the right side paraspinal musculature. Grip strength and sensation normal in bilateral hands Strength good C4 to T1 distribution No sensory change to C4 to T1 Negative Hoffman sign bilaterally Reflexes normal   Impression and Recommendations:     This case required medical decision making of moderate complexity.

## 2014-07-30 NOTE — Assessment & Plan Note (Signed)
I believe the patient is having more of a neck spasm as well as potential next reamed. We discussed icing regimen at home exercises. We discussed anti-inflammatories and patient was given a topical. Patient can use over-the-counter oral anti-inflammatories needed. Encourage patient to continue the exercises which she is doing fairly regularly.

## 2014-07-30 NOTE — Patient Instructions (Signed)
It is wonderful to see you.  Heat before activity and ice after activity Exercises 3-5 times a week.  Pennsaid twice daily.  See me again in 2 weeks.

## 2014-08-04 ENCOUNTER — Ambulatory Visit (INDEPENDENT_AMBULATORY_CARE_PROVIDER_SITE_OTHER): Payer: BLUE CROSS/BLUE SHIELD | Admitting: Internal Medicine

## 2014-08-04 ENCOUNTER — Encounter: Payer: Self-pay | Admitting: Internal Medicine

## 2014-08-04 VITALS — BP 126/70 | HR 76 | Temp 98.9°F | Ht 62.0 in | Wt 104.0 lb

## 2014-08-04 DIAGNOSIS — R058 Other specified cough: Secondary | ICD-10-CM

## 2014-08-04 DIAGNOSIS — R05 Cough: Secondary | ICD-10-CM

## 2014-08-04 NOTE — Progress Notes (Signed)
d Subjective:   Patient ID: [name], female    DOB: [birth year]  MRN: [id number]    Brief patient profile:  55  yowf never smoker with new onset " throat drainage" w/in a few years of arriving in [place] in late 80's and placed on shots for up to 10 years some better worse 3 years off shots and back on shots ever since but still having intermittent problems with tickle in throat dx as vcd in early 2000s sometimes  exac by cig smoke, rx with relaxation techniques but no meds effective to date followed for UACS in pulmonary clinic since 08/15/12    History of Present Illness  08/15/2012 1st pulmonary eval cc recurrent tickling throat leading to severe choking spells started 08/08/12 p latex enamel  paint  Exposure  in her  house   rx by UC then ER then [facility] rx zmax and robitissuin. Thinks may have had gerd prior to last episode but denies any obvious chronic gerd or overt sinus symptoms assoc itch sneeze or ant rhinorrhea. No other obvious exp hex. Only sob with cough, no wheeze, not brought on be exercise.   Last episode severe tickle/ choking was > 1 year prior to OV  rec The key to effective treatment for your cough is eliminating the non-stop cycle of cough you're stuck in long enough to let your airway heal completely and then see if there is anything still making you cough once you stop the cough suppression, but this should take no more than 5 days to figuure out  First take delsym two tsp every 12 hours and supplement if needed with  tramadol 50 mg up to 2 every 4 hours to suppress the urge to cough. Swallowing water or using ice chips/non mint and menthol containing candies (such as lifesavers or sugarless jolly ranchers) are also effective.  You should rest your voice and avoid activities that you know make you cough. Once you have eliminated the cough for 3 straight days try reducing the tramadol first,  then the delsym as tolerated.   Pantoprazole 40 mg  Take 30-60 min before first  meal of the day and Zantac (ranitidine) 300 mg  one bedtime until cough is completely gone for at least a week without the need for cough suppression GERD diet   12/01/2013 f/u ov/[name] re: ? Needs ppi  Chief Complaint  Patient presents with  . Follow-up    Pt here to discuss whether or not to continue PPI. Her cough is still gone. No new co's today.   Not limited by breathing from desired activities  - no need for cough meds rec Try off pantoprazole and as long as not coughing just use pepcid ac 20 mg after bfast and supper x 2 weeks then stop if no cough If cough recurs, resume pantoprazole Take 30- 60 min before your first and last meals of the day until better then once daily. GERD diet    08/04/2014 f/u ov/[name] re: recurrent cough once reduced protonix from 40 to 20 and not using pepcid as per last rec  Chief Complaint  Patient presents with  . Follow-up    Pt states that she decreased protonix to 20 mg about 1 wk ago, and since then has had increased cough. Cough is prod with clear to yellow/green sputum.   assoc with sensation of too much throat mucus esp w/in a few hours of stirring, no noct spells Not limited by breathing from desired activities  No obvious day to day or daytime variabilty or assoc chronic cough or cp or chest tightness, subjective wheeze overt sinus or hb symptoms. No unusual exp hx or h/o childhood pna/ asthma or knowledge of premature birth.  Sleeping ok without nocturnal  or early am exacerbation  of respiratory  c/o's or need for noct saba. Also denies any obvious fluctuation of symptoms with weather or environmental changes or other aggravating or alleviating factors except as outlined above   Current Medications, Allergies, Complete Past Medical History, Past Surgical History, Family History, and Social History were reviewed in [employer] record.  ROS  The following are not active complaints unless bolded sore throat, dysphagia,  dental problems, itching, sneezing,  nasal congestion or excess/ purulent secretions, ear ache,   fever, chills, sweats, unintended wt loss, pleuritic or exertional cp, hemoptysis,  orthopnea pnd or leg swelling, presyncope, palpitations, heartburn, abdominal pain, anorexia, nausea, vomiting, diarrhea  or change in bowel or urinary habits, change in stools or urine, dysuria,hematuria,  rash, arthralgias, visual complaints, headache, numbness weakness or ataxia or problems with walking or coordination,  change in mood/affect or memory.                   Objective:   Physical Exam  amb wf nad  12/01/2013        104 > 08/04/2014  105  Wt Readings from Last 3 Encounters:  08/15/12 104 lb (47.174 kg)  08/14/12 104 lb (47.174 kg)  08/12/12 104 lb (47.174 kg)    HEENT: nl dentition, turbinates, and orophanx. Nl external ear canals without cough reflex   NECK :  without JVD/Nodes/TM/ nl carotid upstrokes bilaterally   LUNGS: no acc muscle use, clear to A and P bilaterally without cough on insp or exp maneuvers   CV:  RRR  no s3 or murmur or increase in P2, no edema   ABD:  soft and nontender with nl excursion in the supine position. No bruits or organomegaly, bowel sounds nl  MS:  warm without deformities, calf tenderness, cyanosis or clubbing  SKIN: warm and dry without lesions    NEURO:  alert, approp, no deficits            Assessment & Plan:

## 2014-08-04 NOTE — Patient Instructions (Signed)
Protonix 40 mg Take 30-60 min before first meal of the day and pepcid 20 mg and chlortrimeton 4 mg at bedtime to see if problem resolves and if not return here  For drainage take chlortrimeton (chlorpheniramine) 4 mg every 4 hours available over the counter (may cause drowsiness)   GERD (REFLUX)  is an extremely common cause of respiratory symptoms just like yours , many times with no obvious heartburn at all.    It can be treated with medication, but also with lifestyle changes including avoidance of late meals, excessive alcohol, smoking cessation, and avoid fatty foods, chocolate, peppermint, colas, red wine, and acidic juices such as orange juice.  NO MINT OR MENTHOL PRODUCTS SO NO COUGH DROPS  USE SUGARLESS CANDY INSTEAD (Jolley ranchers or Stover's or Life Savers) or even ice chips will also do - the key is to swallow to prevent all throat clearing. NO OIL BASED VITAMINS - use powdered substitutes.  If problems swallowing or further problems with pantoprazole please see Dr Scarlette Shorts - pulmonary follow up is as needed

## 2014-08-07 ENCOUNTER — Ambulatory Visit (INDEPENDENT_AMBULATORY_CARE_PROVIDER_SITE_OTHER): Payer: BLUE CROSS/BLUE SHIELD | Admitting: Family

## 2014-08-07 ENCOUNTER — Encounter: Payer: Self-pay | Admitting: Family

## 2014-08-07 VITALS — BP 110/70 | HR 69 | Temp 98.3°F | Ht 62.0 in | Wt 104.0 lb

## 2014-08-07 DIAGNOSIS — J069 Acute upper respiratory infection, unspecified: Secondary | ICD-10-CM

## 2014-08-07 DIAGNOSIS — B9789 Other viral agents as the cause of diseases classified elsewhere: Principal | ICD-10-CM

## 2014-08-07 NOTE — Progress Notes (Signed)
Pre visit review using our clinic review tool, if applicable. No additional management support is needed unless otherwise documented below in the visit note. 

## 2014-08-07 NOTE — Patient Instructions (Signed)
Restart allegra. Start flonase 2 sprays each nostril once daily. Call if symptoms worsen or if not improved in 3-4 days.

## 2014-08-07 NOTE — Progress Notes (Signed)
Subjective:    Patient ID: [name], female    DOB: 02-Apr-1951, 64 y.o.   MRN: [id number]  HPI  [name] is a 64 yr old female who presents today with 3 day hx of cough productive of green phlegm, mild sore throat, sinus drainage.  Sometimes mild bloody nasal drainage.    She reports that she was on protonix 40, but cut down to 20mg  due to recent reports on PPI.  Symptoms worsened.  She saw Dr. Melvyn Novas, he placed her back on 40mg  and also added HS pepcid.  Reports raspy voice  Review of Systems Past Medical History  Diagnosis Date  . Mild aortic insufficiency   . Mild tricuspid regurgitation   . Internal hemorrhoids without mention of complication   . Unspecified constipation   . Irritable bowel syndrome   . Hemorrhage of rectum and anus   . Microscopic hematuria   . Personal history of urinary calculi   . Other plastic surgery for unacceptable cosmetic appearance     Inj tx fllers/expander  . Esophageal reflux   . Postmenopausal atrophic vaginitis   . Backache, unspecified   . Allergic rhinitis, cause unspecified   . Conversion disorder   . Calculus of kidney   . Migraine headache with aura   . Benign fasciculation-cramp syndrome 12/03/2012     Worsening with  stress, fatigue .  [name] Osteopenia   . Premature atrial contractions   . Vasovagal syncope   . GERD (gastroesophageal reflux disease)   . PVC (premature ventricular contraction)     a. Event monitor 2013.    History   Social History  . Marital Status: Married    Spouse Name: [name]  . Number of Children: 0  . Years of Education: 14   Occupational History  . OFFICE liasion   . [employer]   . [employer]    Social History Main Topics  . Smoking status: Never Smoker   . Smokeless tobacco: Never Used  . Alcohol Use: No  . Drug Use: No  . Sexual Activity: Not on file   Other Topics Concern  . Not on file   Social History Narrative   [employer]  in [place]. Married '90 - marriage is  in very good shape '12, No children.  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 Offerred medical services for them if needed (Nov '11)   Patient is married [name]) and lives at home with her husband.   Patient is working Administrator, arts work.   Patient has a high school education.   Patient is right-handed.   Patient does not drink any caffeine.          Past Surgical History  Procedure Laterality Date  . Cosmetic procedures w/injection therapy    . Wisdom tooth extraction    . Breast biopsy Right     Family History  Problem Relation Age of Onset  . Hyperlipidemia Father   . COPD Father     Smoker  . Hyperlipidemia Mother   . Diabetes Neg Hx   . Colon cancer Neg Hx   . Breast cancer Neg Hx   . Coronary artery disease Neg Hx   . Cancer Neg Hx     breast or colon  . Hypertension Father   . Hypertension Mother     Allergies  Allergen Reactions  . Codeine Other (See Comments)  Doesn't remeber  . Epinephrine     Heart racing   . Penicillins Other (See Comments)    Doesn't remember   . Sulfonamide Derivatives Other (See Comments)    Doesn't remember     Current Outpatient Prescriptions on File Prior to Visit  Medication Sig Dispense Refill  . ALPRAZolam (XANAX) 0.5 MG tablet Take 1 tablet (0.5 mg total) by mouth 2 (two) times daily as needed for anxiety. 30 tablet 1  . ALREX 0.2 % SUSP Apply 0.2 drops to eye as needed.     . Botulinum Toxin Type A, Cosm, (BOTOX COSMETIC) 50 UNITS SOLR Inject 4-6 Units into the muscle. 3 month  injection    . Diclofenac Sodium 2 % SOLN Apply twice daily. 112 g 1  . fluticasone (FLONASE) 50 MCG/ACT nasal spray   5  . FML S.O.P. 0.1 % ophthalmic ointment Place 1 application into the left eye 3 (three) times daily.     [name] Hyaluronic Acid (RESTYLANE IJ) Inject as directed every 6 (six) months.    . hydrocortisone (ANUSOL-HC) 25 MG suppository Place 1  suppository (25 mg total) rectally at bedtime as needed for hemorrhoids or itching (one per rectum daily as needed for hemorrhoids). 30 suppository 3  . ibuprofen (ADVIL,MOTRIN) 200 MG tablet Take 200 mg by mouth as needed.      . pantoprazole (PROTONIX) 40 MG tablet TAKE 30-60 MIN BEFORE FIRST MEAL OF THE DAY 90 tablet 3  . PREMARIN vaginal cream INSERT 1 GRAM VAGINALLY ONCE A WEEK AS DIRECTED 30 g 1  . tretinoin (RETIN-A) 0.05 % cream Apply 1 application topically at bedtime.    . triamcinolone cream (KENALOG) 0.5 % Apply 1 application topically 4 (four) times daily. 15 g 1  . fluocinonide cream (LIDEX) 0.92 % Apply 1 application topically 2 (two) times daily as needed.      No current facility-administered medications on file prior to visit.    BP 110/70 mmHg  Pulse 69  Temp(Src) 98.3 F (36.8 C) (Oral)  Ht 5\' 2"  (1.575 m)  Wt 104 lb (47.174 kg)  BMI 19.02 kg/m2  SpO2 98%       Objective:   Physical Exam  Constitutional: She appears well-developed and well-nourished.  HENT:  Right Ear: Tympanic membrane and ear canal normal.  Left Ear: Tympanic membrane and ear canal normal.  Mouth/Throat: No oropharyngeal exudate.  Cardiovascular: Normal rate, regular rhythm and normal heart sounds.   No murmur heard. Pulmonary/Chest: Effort normal and breath sounds normal. No respiratory distress. She has no wheezes.  Lymphadenopathy:    She has no cervical adenopathy.  Psychiatric: She has a normal mood and affect. Her behavior is normal. Judgment and thought content normal.          Assessment & Plan:  Viral URI- advised pt that symptoms most consistent with viral URI.  Sounds like she had a flare of her GERD, then developed URI.  Continue antacid treatment, add allegra and flonase for nasal congestion. Call if symptoms worsen or if not improved in 2-3 days.

## 2014-08-08 ENCOUNTER — Encounter: Payer: Self-pay | Admitting: Internal Medicine

## 2014-08-08 NOTE — Assessment & Plan Note (Signed)
I had an extended discussion with the patient reviewing all relevant studies completed to date and  lasting 15 to 20 minutes of a 25 minute visit on the following ongoing concerns:  Clearly this is  Classic Upper airway cough syndrome, so named because it's frequently impossible to sort out how much is  CR/sinusitis with freq throat clearing (which can be related to primary GERD)   vs  causing  secondary (" extra esophageal")  GERD from wide swings in gastric pressure that occur with throat clearing, often  promoting self use of mint and menthol lozenges that reduce the lower esophageal sphincter tone and exacerbate the problem further in a cyclical fashion.   These are the same pts (now being labeled as having "irritable larynx syndrome" by some cough centers) who not infrequently have a history of having failed to tolerate ace inhibitors,  dry powder inhalers or biphosphonates or report having atypical reflux symptoms that don't respond to standard doses of PPI , and are easily confused as having aecopd or asthma flares by even experienced allergists/ pulmonologists.   Did fine on intense gerd rx and not as well since tapered and did not follow the contingency instructions, so reviewed them with her again today line by line  Next step is f/u by GI doc of record, Dr Scarlette Shorts   Pulmonary f/u can be prn    Each maintenance medication was reviewed in detail including most importantly the difference between maintenance and as needed and under what circumstances the prns are to be used.  Please see instructions for details which were reviewed in writing and the patient given a copy.

## 2014-08-11 ENCOUNTER — Telehealth: Payer: Self-pay | Admitting: *Deleted

## 2014-08-11 NOTE — Telephone Encounter (Signed)
[place] Night - Client [facility] Patient Name: [name] Gender: Female DOB: 1951-03-07 Age: 64 Y 9 M 12 D Return Phone Number: [phone] (Primary) Address: City/State/Zip: [place] [address] Client [place] Night - Client Client Site [place] - Night Physician Plotnikov, Alex Contact Type Call Call Type Triage / Clinical Relationship To Patient Self Return Phone Number [phone] (Primary) Chief Complaint Cough Initial Comment caller states she has cough, congestion and sore throat PreDisposition Call Doctor Nurse Assessment Nurse: Wynetta Emery, RN, Baker Janus Date/Time Eilene Ghazi Time): 08/07/2014 8:23:31 AM Confirm and document reason for call. If symptomatic, describe symptoms. ---Colletta [place] has cough, congestion and sore throat -- green thick sputum no fever present. onset two days Has the patient traveled out of the country within the last 30 days? ---No Does the patient require triage? ---Yes Related visit to physician within the last 2 weeks? ---No Does the PT have any chronic conditions? (i.e. diabetes, asthma, etc.) ---No Guidelines Guideline Title Affirmed Question Affirmed Notes Nurse Date/Time (Eastern Time) Cough - Acute Productive [1] Continuous (nonstop) coughing interferes with work or school AND [2] no improvement using cough treatment per Care Advice interferes with sleep last couple of nights Ivin Booty 08/07/2014 8:24:55 AM Disp. Time Eilene Ghazi Time) Disposition Final User 08/07/2014 8:05:50 AM Send To Clinical Follow Up Jeral Fruit 08/07/2014 8:27:51 AM See Physician within 24 Hours Yes Wynetta Emery, RN, Christin Bach Understands: Yes Disagree/Comply: Comply PLEASE NOTE: All timestamps contained within this report are represented as [place] Standard Time. CONFIDENTIALTY NOTICE: This fax transmission is intended only for the addressee. It  contains information that is legally privileged, confidential or otherwise protected from use or disclosure. If you are not the intended recipient, you are strictly prohibited from reviewing, disclosing, copying using or disseminating any of this information or taking any action in reliance on or regarding this information. If you have received this fax in error, please notify us immediately by telephone so that we can arrange for its return to [place]. Phone: [phone], Toll-Free: [phone], Fax: [phone] Page: 2 of 2 Call Id: [id number] Care Advice Given Per Guideline SEE PHYSICIAN WITHIN 24 HOURS: - OTC COUGH SYRUPS: The most common cough suppressant in OTC cough medications is dextromethorphan. Often the letters 'DM' appear in the name. - OTC COUGH DROPS: Cough drops can help a lot, especially for mild coughs. They reduce coughing by soothing your irritated throat and removing that tickle sensation in the back of the throat. Cough drops also have the advantage of portability - you can carry them with you. HUMIDIFIER: If the air is dry, use a humidifier in the bedroom. (Reason: dry air makes coughs worse) * Drink warm fluids. Inhale warm mist. (Reason: both relax the airway and loosen up the phlegm) * Suck on cough drops or hard candy to coat the irritated throat. * Difficulty breathing occurs * You become worse. CARE ADVICE given per Cough - Acute Productive (Adult) guideline.

## 2014-08-18 ENCOUNTER — Telehealth: Payer: Self-pay | Admitting: Internal Medicine

## 2014-08-18 ENCOUNTER — Ambulatory Visit (INDEPENDENT_AMBULATORY_CARE_PROVIDER_SITE_OTHER): Payer: BLUE CROSS/BLUE SHIELD | Admitting: Internal Medicine

## 2014-08-18 ENCOUNTER — Encounter: Payer: Self-pay | Admitting: Internal Medicine

## 2014-08-18 VITALS — BP 140/70 | HR 89 | Temp 99.2°F | Wt 101.0 lb

## 2014-08-18 DIAGNOSIS — J0100 Acute maxillary sinusitis, unspecified: Secondary | ICD-10-CM

## 2014-08-18 DIAGNOSIS — R21 Rash and other nonspecific skin eruption: Secondary | ICD-10-CM

## 2014-08-18 DIAGNOSIS — J019 Acute sinusitis, unspecified: Secondary | ICD-10-CM | POA: Insufficient documentation

## 2014-08-18 MED ORDER — PREDNISONE 10 MG PO TABS: ORAL_TABLET | ORAL | Status: DC

## 2014-08-18 NOTE — Progress Notes (Signed)
   Subjective:    HPI  C/o rash since yesterday am - itching bad Pt had a Zpac 2/22-2/27 for a sinusitis and a Depomedrol on 2/22 at Dr Seward Meth for sinusitis  F/u grief, stress, HAs  F/u LBP, dense breasts on mammo, microhematuria - not new     Review of Systems  Constitutional: Negative for chills, activity change, appetite change, fatigue and unexpected weight change.  HENT: Negative for congestion, mouth sores and sinus pressure.   Eyes: Negative for visual disturbance.  Respiratory: Negative for cough and chest tightness.   Gastrointestinal: Negative for nausea and abdominal pain.  Genitourinary: Negative for frequency, difficulty urinating and vaginal pain.  Musculoskeletal: Negative for back pain and gait problem.  Skin: Negative for pallor and rash.  Neurological: Negative for dizziness, tremors, weakness, numbness and headaches.  Psychiatric/Behavioral: Positive for sleep disturbance. Negative for suicidal ideas and confusion. The patient is nervous/anxious.   rash     Objective:   Physical Exam  Constitutional: She appears well-developed. No distress.  HENT:  Head: Normocephalic.  Right Ear: External ear normal.  Left Ear: External ear normal.  Nose: Nose normal.  Mouth/Throat: Oropharynx is clear and moist.  Eyes: Conjunctivae are normal. Pupils are equal, round, and reactive to light. Right eye exhibits no discharge. Left eye exhibits no discharge.  Neck: Normal range of motion. Neck supple. No JVD present. No tracheal deviation present. No thyromegaly present.  Cardiovascular: Normal rate, regular rhythm and normal heart sounds.   Pulmonary/Chest: No stridor. No respiratory distress. She has no wheezes.  Abdominal: Soft. Bowel sounds are normal. She exhibits no distension and no mass. There is no tenderness. There is no rebound and no guarding.  Musculoskeletal: She exhibits no edema or tenderness.  Lymphadenopathy:    She has no cervical adenopathy.    Neurological: She displays normal reflexes. No cranial nerve deficit. She exhibits normal muscle tone. Coordination normal.  Skin: No rash noted. No erythema.  Psychiatric: Her behavior is normal. Judgment and thought content normal.   Extensive papular rash on trunk and extremities       Assessment & Plan:

## 2014-08-18 NOTE — Telephone Encounter (Signed)
Pt called in wanted to know when should be getting the shot for the reaction.  Pt would like a call back

## 2014-08-18 NOTE — Assessment & Plan Note (Addendum)
3/16 Zpac skin reaction - severe Prednisone 10 mg: take 4 tabs a day x 3 days; then 3 tabs a day x 4 days; then 2 tabs a day x 4 days, then 1 tab a day x 6 days, then stop. Take pc. Declined Depo Use Benadryl po   Potential benefits of a short term steroid  use as well as potential risks  and complications were explained to the patient and were aknowledged.

## 2014-08-18 NOTE — Progress Notes (Signed)
Pre visit review using our clinic review tool, if applicable. No additional management support is needed unless otherwise documented below in the visit note. 

## 2014-08-18 NOTE — Assessment & Plan Note (Signed)
2/16 treated w/Zpac

## 2014-08-19 ENCOUNTER — Encounter: Payer: Self-pay | Admitting: Internal Medicine

## 2014-08-19 ENCOUNTER — Ambulatory Visit (INDEPENDENT_AMBULATORY_CARE_PROVIDER_SITE_OTHER): Payer: BLUE CROSS/BLUE SHIELD | Admitting: Internal Medicine

## 2014-08-19 ENCOUNTER — Telehealth: Payer: Self-pay

## 2014-08-19 ENCOUNTER — Ambulatory Visit: Payer: BLUE CROSS/BLUE SHIELD | Admitting: Family

## 2014-08-19 VITALS — BP 128/84 | HR 70 | Temp 98.5°F | Ht 62.0 in | Wt 101.2 lb

## 2014-08-19 DIAGNOSIS — L500 Allergic urticaria: Secondary | ICD-10-CM

## 2014-08-19 MED ORDER — HYDROXYZINE HCL 10 MG PO TABS: 10.0000 mg | ORAL_TABLET | Freq: Three times a day (TID) | ORAL | Status: DC | PRN

## 2014-08-19 MED ORDER — RANITIDINE HCL 150 MG PO TABS: 150.0000 mg | ORAL_TABLET | Freq: Two times a day (BID) | ORAL | Status: DC

## 2014-08-19 NOTE — Progress Notes (Signed)
Pre visit review using our clinic review tool, if applicable. No additional management support is needed unless otherwise documented below in the visit note. 

## 2014-08-19 NOTE — Progress Notes (Signed)
   Subjective:    Patient ID: [name], female    DOB: 1950/08/03, 64 y.o.   MRN: [id number]  HPI  As of 08/10/14 she noticed itching of her throat and ears. She subsequently developed diffuse urticarial lesions of trunk and extremities. She was seen by her Dermatologist 2/29 who recommended a steroid cream which has not been of benefit. He questioned etiology of carpet beetle bite. The pest control company inspected her house and found no infestation.  The lesions have progressed despite Depo-Medrol and high-dose prednisone orally; she received 40 mg the last 2 days.  Before the onset of symptoms she had ingested strawberries. She has a history of allergies to strawberries as a child.  Review of Systems  No associated itchy, watery eyes.  Swelling of the lips or tongue or intraoral lesions denied.  Shortness of breath, wheezing, or cough absent.  No vesicles, pustules  noted.  Fever ,chills , or sweats denied.   Diarrhea not present.  No dysuria, pyuria or hematuria.    Objective:   Physical Exam  Positive or pertinent findings include: She has scattered classic urticarial lesions which blanch with pressure. She has marked dermatographia to skin stimulation.  The aorta is palpable and a bruit is present. There is no aortic aneurysm.  General appearance:Adequately nourished; no acute distress or increased work of breathing is present.  No  lymphadenopathy about the head, neck, or axilla noted.  Eyes: No conjunctival inflammation or lid edema is present. There is no scleral icterus. Ears:  External ear exam shows no significant lesions or deformities.  Otoscopic examination reveals clear canals, tympanic membranes are intact bilaterally without bulging, retraction, inflammation or discharge. Nose:  External nasal examination shows no deformity or inflammation. Nasal mucosa are pink and moist without lesions or exudates. No septal dislocation or deviation.No obstruction to  airflow.  Oral exam: Dental hygiene is good; lips and gums are healthy appearing.There is no oropharyngeal erythema or exudate noted.  Neck:  No deformities, thyromegaly, masses, or tenderness noted.   Supple with full [name] of motion without pain.  Heart:  Normal rate and regular rhythm. S1 and S2 normal without gallop, murmur, click, rub or other extra sounds.  Lungs:Chest clear to auscultation; no wheezes, rhonchi,rales ,or rubs present. Extremities:  No cyanosis, edema, or clubbing  noted  Skin: Warm & dry w/o jaundice or tenting.       Assessment & Plan:  #1 urticaria  See orders and recommendations

## 2014-08-19 NOTE — Telephone Encounter (Signed)
Wait 6 hours from Benadryl before starting Hydroxyzine. Nasal Hygiene preventive protocol: Plain Mucinex (NOT D) for thick secretions ;force NON dairy fluids .   Nasal cleansing in the shower as discussed with lather of mild shampoo.After 10 seconds wash off lather while  exhaling through nostrils. Make sure that all residual soap is removed to prevent irritation.  Flonase OR Nasacort AQ 1 spray in each nostril twice a day as needed. Use the "crossover" technique into opposite nostril spraying toward opposite ear @ 45 degree angle, not straight up into nostril.  Plain Allegra (NOT D )  160 daily , Loratidine 10 mg , OR Zyrtec 10 mg @ bedtime  as needed for itchy eyes & sneezing.  Print & mail or send through My Chart message please

## 2014-08-19 NOTE — Patient Instructions (Addendum)
Avoid soaps and cosmetics which are not hypoallergenic. Restrict hyperallergenic foods at this time: Nuts, strawberries, seafood , chocolate, and tomatoes.  Take the hydroxyzine as needed &  Zantac in place of Pantoprazole .Trydocument any dietary , medicinal, chemical or environmental exposures in the previous 8-12 hours if lesions continue.   Go to [facility] MD for Urticaria.

## 2014-08-19 NOTE — Telephone Encounter (Signed)
Left mess for patient to call back.  

## 2014-08-19 NOTE — Telephone Encounter (Signed)
Phone call from patient. She saw you this morning. She was given a prescription for pills to relieve itching. She states about 7:30 this morning she took 2 tsp of children's benadryl this morning. Is it okay for her to start the prescription you gave her for itching? Also she states you started to tell her something about helping not to get sinus infections. Please advise.

## 2014-08-19 NOTE — Telephone Encounter (Signed)
Patient has been sent a mychart message.

## 2014-08-20 ENCOUNTER — Encounter: Payer: Self-pay | Admitting: Internal Medicine

## 2014-08-20 ENCOUNTER — Ambulatory Visit (INDEPENDENT_AMBULATORY_CARE_PROVIDER_SITE_OTHER): Payer: BLUE CROSS/BLUE SHIELD | Admitting: Internal Medicine

## 2014-08-20 VITALS — BP 120/70 | HR 77 | Temp 98.5°F | Wt 102.0 lb

## 2014-08-20 DIAGNOSIS — L500 Allergic urticaria: Secondary | ICD-10-CM | POA: Insufficient documentation

## 2014-08-20 DIAGNOSIS — R21 Rash and other nonspecific skin eruption: Secondary | ICD-10-CM

## 2014-08-20 MED ORDER — PREDNISONE 10 MG PO TABS: ORAL_TABLET | ORAL | Status: DC

## 2014-08-20 NOTE — Patient Instructions (Signed)
Prednisone 10 mg: take 6 tabs a day x 2 days; then 4 tabs a day x 4 days; then 3 tabs a day x 4 days, then 2 tab a day x 4 days, then 1 tab a day for 3 days, then stop. Take with food.

## 2014-08-20 NOTE — Telephone Encounter (Signed)
Pt seen by PCP today re: this. See OV note.

## 2014-08-20 NOTE — Assessment & Plan Note (Signed)
Better  New Rx:  Prednisone 10 mg: take 6 tabs a day x 2 days; then 4 tabs a day x 4 days; then 3 tabs a day x 4 days, then 2 tab a day x 4 days, then 1 tab a day for 3 days, then stop. Take pc.

## 2014-08-20 NOTE — Progress Notes (Signed)
   Subjective:    HPI  C/o rash and itching - was itching bad; better today however; no new lesions. Hydroxyzine helped.  Pt had a Zpac 2/22-2/27 for a sinusitis and a Depomedrol on 2/22 at Dr Seward Meth for sinusitis  F/u grief, stress, HAs  F/u LBP, dense breasts on mammo, microhematuria - not new     Review of Systems  Constitutional: Negative for chills, activity change, appetite change, fatigue and unexpected weight change.  HENT: Negative for congestion, mouth sores and sinus pressure.   Eyes: Negative for visual disturbance.  Respiratory: Negative for cough and chest tightness.   Gastrointestinal: Negative for nausea and abdominal pain.  Genitourinary: Negative for frequency, difficulty urinating and vaginal pain.  Musculoskeletal: Negative for back pain and gait problem.  Skin: Negative for pallor and rash.  Neurological: Negative for dizziness, tremors, weakness, numbness and headaches.  Psychiatric/Behavioral: Positive for sleep disturbance. Negative for suicidal ideas and confusion. The patient is nervous/anxious.   rash     Objective:   Physical Exam  Constitutional: She appears well-developed. No distress.  HENT:  Head: Normocephalic.  Right Ear: External ear normal.  Left Ear: External ear normal.  Nose: Nose normal.  Mouth/Throat: Oropharynx is clear and moist.  Eyes: Conjunctivae are normal. Pupils are equal, round, and reactive to light. Right eye exhibits no discharge. Left eye exhibits no discharge.  Neck: Normal range of motion. Neck supple. No JVD present. No tracheal deviation present. No thyromegaly present.  Cardiovascular: Normal rate, regular rhythm and normal heart sounds.   Pulmonary/Chest: No stridor. No respiratory distress. She has no wheezes.  Abdominal: Soft. Bowel sounds are normal. She exhibits no distension and no mass. There is no tenderness. There is no rebound and no guarding.  Musculoskeletal: She exhibits no edema or tenderness.    Lymphadenopathy:    She has no cervical adenopathy.  Neurological: She displays normal reflexes. No cranial nerve deficit. She exhibits normal muscle tone. Coordination normal.  Skin: No rash noted. No erythema.  Psychiatric: Her behavior is normal. Judgment and thought content normal.   Extensive papular rash on trunk and extremities - no new lesions       Assessment & Plan:

## 2014-08-21 ENCOUNTER — Encounter: Payer: Self-pay | Admitting: Family Medicine

## 2014-08-21 ENCOUNTER — Ambulatory Visit (INDEPENDENT_AMBULATORY_CARE_PROVIDER_SITE_OTHER): Payer: BLUE CROSS/BLUE SHIELD | Admitting: Family Medicine

## 2014-08-21 VITALS — BP 136/80 | HR 75 | Temp 98.2°F | Resp 18 | Wt 102.1 lb

## 2014-08-21 DIAGNOSIS — R21 Rash and other nonspecific skin eruption: Secondary | ICD-10-CM

## 2014-08-21 DIAGNOSIS — L500 Allergic urticaria: Secondary | ICD-10-CM

## 2014-08-21 NOTE — Patient Instructions (Signed)

## 2014-08-21 NOTE — Progress Notes (Signed)
Pre visit review using our clinic review tool, if applicable. No additional management support is needed unless otherwise documented below in the visit note. 

## 2014-08-22 ENCOUNTER — Encounter (HOSPITAL_BASED_OUTPATIENT_CLINIC_OR_DEPARTMENT_OTHER): Payer: Self-pay | Admitting: *Deleted

## 2014-08-22 ENCOUNTER — Emergency Department (HOSPITAL_BASED_OUTPATIENT_CLINIC_OR_DEPARTMENT_OTHER)
Admission: EM | Admit: 2014-08-22 | Discharge: 2014-08-22 | Disposition: A | Discharge status: Home / Self Care | Payer: BLUE CROSS/BLUE SHIELD | Attending: Emergency Medicine | Admitting: Emergency Medicine

## 2014-08-22 ENCOUNTER — Encounter: Payer: Self-pay | Admitting: Family Medicine

## 2014-08-22 DIAGNOSIS — Z7901 Long term (current) use of anticoagulants: Secondary | ICD-10-CM | POA: Diagnosis not present

## 2014-08-22 DIAGNOSIS — Z8742 Personal history of other diseases of the female genital tract: Secondary | ICD-10-CM | POA: Insufficient documentation

## 2014-08-22 DIAGNOSIS — G43109 Migraine with aura, not intractable, without status migrainosus: Secondary | ICD-10-CM | POA: Insufficient documentation

## 2014-08-22 DIAGNOSIS — Z8679 Personal history of other diseases of the circulatory system: Secondary | ICD-10-CM | POA: Insufficient documentation

## 2014-08-22 DIAGNOSIS — Z7952 Long term (current) use of systemic steroids: Secondary | ICD-10-CM | POA: Insufficient documentation

## 2014-08-22 DIAGNOSIS — R002 Palpitations: Secondary | ICD-10-CM | POA: Diagnosis present

## 2014-08-22 DIAGNOSIS — Z8659 Personal history of other mental and behavioral disorders: Secondary | ICD-10-CM | POA: Diagnosis not present

## 2014-08-22 DIAGNOSIS — Z8719 Personal history of other diseases of the digestive system: Secondary | ICD-10-CM | POA: Diagnosis not present

## 2014-08-22 DIAGNOSIS — Z87442 Personal history of urinary calculi: Secondary | ICD-10-CM | POA: Insufficient documentation

## 2014-08-22 DIAGNOSIS — Z880 Allergy status to penicillin: Secondary | ICD-10-CM | POA: Insufficient documentation

## 2014-08-22 DIAGNOSIS — Z79899 Other long term (current) drug therapy: Secondary | ICD-10-CM | POA: Insufficient documentation

## 2014-08-22 LAB — BASIC METABOLIC PANEL
BUN: 20 mg/dL (ref 6–23)
CALCIUM: 8.9 mg/dL (ref 8.4–10.5)
CHLORIDE: 108 mmol/L (ref 96–112)
CO2: 27 mmol/L (ref 19–32)
CREATININE: 0.76 mg/dL (ref 0.50–1.10)
GFR, EST NON AFRICAN AMERICAN: 89 mL/min — AB (ref >90)
Glucose, Bld: 221 mg/dL — ABNORMAL HIGH (ref 70–99)
Potassium: 4.8 mmol/L (ref 3.5–5.1)
Sodium: 136 mmol/L (ref 135–145)

## 2014-08-22 LAB — CBC WITH DIFFERENTIAL/PLATELET
BASOS PCT: 0 % (ref 0–1)
Basophils Absolute: 0 10*3/uL (ref 0.0–0.1)
EOS PCT: 0 % (ref 0–5)
Eosinophils Absolute: 0 10*3/uL (ref 0.0–0.7)
HCT: 40.8 % (ref 36.0–46.0)
HEMOGLOBIN: 13.5 g/dL (ref 12.0–15.0)
Lymphocytes Relative: 7 % — ABNORMAL LOW (ref 12–46)
Lymphs Abs: 0.7 10*3/uL (ref 0.7–4.0)
MCH: 31.1 pg (ref 26.0–34.0)
MCHC: 33.1 g/dL (ref 30.0–36.0)
MCV: 94 fL (ref 78.0–100.0)
MONOS PCT: 1 % — AB (ref 3–12)
Monocytes Absolute: 0.1 10*3/uL (ref 0.1–1.0)
Neutro Abs: 9 10*3/uL — ABNORMAL HIGH (ref 1.7–7.7)
Neutrophils Relative %: 92 % — ABNORMAL HIGH (ref 43–77)
Platelets: 218 10*3/uL (ref 150–400)
RBC: 4.34 MIL/uL (ref 3.87–5.11)
RDW: 12.4 % (ref 11.5–15.5)
WBC: 9.8 10*3/uL (ref 4.0–10.5)

## 2014-08-22 NOTE — ED Notes (Addendum)
States she was on prednisone last week for a rash, felt like she had been palpitations the last few days, states that earlier today she felt her heart was "racing" and called the PCP and was told to come to the ER

## 2014-08-22 NOTE — Discharge Instructions (Signed)

## 2014-08-22 NOTE — ED Provider Notes (Signed)
CSN: [id number]     Arrival date & time 08/22/14  1227 History   First MD Initiated Contact with Patient 08/22/14 1346     No chief complaint on file.    (Consider location/radiation/quality/duration/timing/severity/associated sxs/prior Treatment) HPI Comments: Patient is a 64 year old female who presents for evaluation of palpitations. She was recently started on prednisone for a suspected allergic reaction to Zithromax. She has had 2 episodes where her heart has been rapidly she denies any chest pain or shortness of breath. He called her primary doctor who told her to come to be evaluated.  Patient is a 64 y.o. female presenting with palpitations. The history is provided by the patient.  Palpitations Palpitations quality:  Fast Duration:  2 days Timing:  Intermittent Chronicity:  New Relieved by:  Nothing Worsened by:  Nothing Ineffective treatments:  None tried   Past Medical History  Diagnosis Date  . Mild aortic insufficiency   . Mild tricuspid regurgitation   . Internal hemorrhoids without mention of complication   . Unspecified constipation   . Irritable bowel syndrome   . Hemorrhage of rectum and anus   . Microscopic hematuria   . Personal history of urinary calculi   . Other plastic surgery for unacceptable cosmetic appearance     Inj tx fllers/expander  . Esophageal reflux   . Postmenopausal atrophic vaginitis   . Backache, unspecified   . Allergic rhinitis, cause unspecified   . Conversion disorder   . Calculus of kidney   . Migraine headache with aura   . Benign fasciculation-cramp syndrome 12/03/2012     Worsening with  stress, fatigue .  [name] Osteopenia   . Premature atrial contractions   . Vasovagal syncope   . GERD (gastroesophageal reflux disease)   . PVC (premature ventricular contraction)     a. Event monitor 2013.   Past Surgical History  Procedure Laterality Date  . Cosmetic procedures w/injection therapy    . Wisdom tooth extraction    . Breast  biopsy Right    Family History  Problem Relation Age of Onset  . Hyperlipidemia Father   . COPD Father     Smoker  . Hyperlipidemia Mother   . Diabetes Neg Hx   . Colon cancer Neg Hx   . Breast cancer Neg Hx   . Coronary artery disease Neg Hx   . Cancer Neg Hx     breast or colon  . Hypertension Father   . Hypertension Mother    History  Substance Use Topics  . Smoking status: Never Smoker   . Smokeless tobacco: Never Used  . Alcohol Use: No   OB History    No data available     Review of Systems  Cardiovascular: Positive for palpitations.  All other systems reviewed and are negative.     Allergies  Codeine; Epinephrine; Penicillins; Sulfonamide derivatives; and Zithromax  Home Medications   Prior to Admission medications   Medication Sig Start Date End Date Taking? Authorizing Provider  ALREX 0.2 % SUSP Apply 0.2 drops to eye as needed.  10/19/12   Historical Provider, MD  Botulinum Toxin Type A, Cosm, (BOTOX COSMETIC) 50 UNITS SOLR Inject 4-6 Units into the muscle. 3 month  injection    Historical Provider, MD  Diclofenac Sodium 2 % SOLN Apply twice daily. 02/16/14   Lyndal Pulley, DO  fluocinonide cream (LIDEX) 0.94 % Apply 1 application topically 2 (two) times daily as needed.     Historical Provider, MD  fluticasone (  FLONASE) 50 MCG/ACT nasal spray  03/26/14   Historical Provider, MD  FML S.O.P. 0.1 % ophthalmic ointment Place 1 application into the left eye 3 (three) times daily.  04/23/14   Historical Provider, MD  Hyaluronic Acid (RESTYLANE IJ) Inject as directed every 6 (six) months.    Historical Provider, MD  hydrocortisone (ANUSOL-HC) 25 MG suppository Place 1 suppository (25 mg total) rectally at bedtime as needed for hemorrhoids or itching (one per rectum daily as needed for hemorrhoids). 06/04/13   Irene Shipper, MD  hydrOXYzine (ATARAX/VISTARIL) 10 MG tablet Take 1 tablet (10 mg total) by mouth 3 (three) times daily as needed. 08/19/14   Hendricks Limes, MD   ibuprofen (ADVIL,MOTRIN) 200 MG tablet Take 200 mg by mouth as needed.      Historical Provider, MD  predniSONE (DELTASONE) 10 MG tablet As directed 08/20/14   Cassandria Anger, MD  PREMARIN vaginal cream INSERT 1 GRAM VAGINALLY ONCE A WEEK AS DIRECTED 05/31/13   Neena Rhymes, MD  ranitidine (ZANTAC) 150 MG tablet Take 1 tablet (150 mg total) by mouth 2 (two) times daily. 08/19/14   Hendricks Limes, MD  tretinoin (RETIN-A) 0.05 % cream Apply 1 application topically at bedtime.    Historical Provider, MD   BP 160/88 mmHg  Pulse 87  Temp(Src) 98.3 F (36.8 C) (Oral)  Resp 18  Ht 5\' 2"  (1.575 m)  Wt 103 lb (46.72 kg)  BMI 18.83 kg/m2  SpO2 100% Physical Exam  Constitutional: She is oriented to person, place, and time. She appears well-developed and well-nourished. No distress.  HENT:  Head: Normocephalic and atraumatic.  Neck: Normal range of motion. Neck supple.  Cardiovascular: Normal rate and regular rhythm.  Exam reveals no gallop and no friction rub.   No murmur heard. Pulmonary/Chest: Effort normal and breath sounds normal. No respiratory distress. She has no wheezes.  Abdominal: Soft. Bowel sounds are normal. She exhibits no distension. There is no tenderness.  Musculoskeletal: Normal range of motion.  Neurological: She is alert and oriented to person, place, and time.  Skin: Skin is warm and dry. She is not diaphoretic.  Nursing note and vitals reviewed.   ED Course  Procedures (including critical care time) Labs Review Labs Reviewed  BASIC METABOLIC PANEL  CBC WITH DIFFERENTIAL/PLATELET    Imaging Review No results found.   EKG Interpretation   Date/Time:  Sunday August 22 2014 12:44:08 EST Ventricular Rate:  82 PR Interval:  146 QRS Duration: 78 QT Interval:  342 QTC Calculation: 399 R Axis:   93 Text Interpretation:  Normal sinus rhythm Rightward axis Borderline ECG  Confirmed by DELOS  MD, Mabrey Howland ([id number]) on 08/22/2014 12:51:22 PM      MDM   Final  diagnoses:  None    Patient presents with palpitations that I believe are related to her eating treated with prednisone. Her laboratory studies are unremarkable with the exception of an elevated sugar which I also believe is related to the prednisone she is being treated with. She is to follow-up with her cardiologist to discuss repeat Holter monitor if these symptoms persist. I see nothing today that appears emergent. She is in a sinus rhythm with a normal rate.    Veryl Speak, MD 08/22/14 [phone]

## 2014-08-22 NOTE — Assessment & Plan Note (Signed)
Patient in today for further discussion regarding the treatment of the rash she developed after a course of Azithromcyin. She has had a good response to the Hydroxyzine but notes excessive fatigue but decreased itching. She was unclear how to take the prednisone. We did not change the dosing except to take just a 1/2 tab daily at the end of the course for a few days

## 2014-08-22 NOTE — Progress Notes (Signed)
[name]  [id number] Mar 06, 1951 08/22/2014      Progress Note-Follow Up  Subjective  Chief Complaint  Chief Complaint  Patient presents with  . Medication Problem    questions about Prednisone Rx    HPI  Patient is a 64 y.o. female in today for routine medical care. Patient has had a decrease in itching and rash since starting the Prednisone and hydroxyzine. Very mild palpitations noted. No HA. No insomnia. No worsening of th rash.  Past Medical History  Diagnosis Date  . Mild aortic insufficiency   . Mild tricuspid regurgitation   . Internal hemorrhoids without mention of complication   . Unspecified constipation   . Irritable bowel syndrome   . Hemorrhage of rectum and anus   . Microscopic hematuria   . Personal history of urinary calculi   . Other plastic surgery for unacceptable cosmetic appearance     Inj tx fllers/expander  . Esophageal reflux   . Postmenopausal atrophic vaginitis   . Backache, unspecified   . Allergic rhinitis, cause unspecified   . Conversion disorder   . Calculus of kidney   . Migraine headache with aura   . Benign fasciculation-cramp syndrome 12/03/2012     Worsening with  stress, fatigue .  [name] Osteopenia   . Premature atrial contractions   . Vasovagal syncope   . GERD (gastroesophageal reflux disease)   . PVC (premature ventricular contraction)     a. Event monitor 2013.    Past Surgical History  Procedure Laterality Date  . Cosmetic procedures w/injection therapy    . Wisdom tooth extraction    . Breast biopsy Right     Family History  Problem Relation Age of Onset  . Hyperlipidemia Father   . COPD Father     Smoker  . Hyperlipidemia Mother   . Diabetes Neg Hx   . Colon cancer Neg Hx   . Breast cancer Neg Hx   . Coronary artery disease Neg Hx   . Cancer Neg Hx     breast or colon  . Hypertension Father   . Hypertension Mother     History   Social History  . Marital Status: Married    Spouse Name: [name]  .  Number of Children: 0  . Years of Education: 14   Occupational History  . OFFICE liasion   . [employer]   . [employer]    Social History Main Topics  . Smoking status: Never Smoker   . Smokeless tobacco: Never Used  . Alcohol Use: No  . Drug Use: No  . Sexual Activity: Not on file   Other Topics Concern  . Not on file   Social History Narrative   [employer]  in [place]. Married '90 - marriage is in very good shape '12, No children.  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 Offerred medical services for them if needed (Nov '11)   Patient is married [name]) and lives at home with her husband.   Patient is working Administrator, arts work.   Patient has a high school education.   Patient is right-handed.   Patient does not drink any caffeine.          Current Outpatient Prescriptions on File Prior to Visit  Medication Sig Dispense Refill  . ALREX 0.2 % SUSP Apply 0.2 drops to eye as needed.     . Botulinum Toxin  Type A, Cosm, (BOTOX COSMETIC) 50 UNITS SOLR Inject 4-6 Units into the muscle. 3 month  injection    . Diclofenac Sodium 2 % SOLN Apply twice daily. 112 g 1  . fluocinonide cream (LIDEX) 8.03 % Apply 1 application topically 2 (two) times daily as needed.     . fluticasone (FLONASE) 50 MCG/ACT nasal spray   5  . FML S.O.P. 0.1 % ophthalmic ointment Place 1 application into the left eye 3 (three) times daily.     [name] Hyaluronic Acid (RESTYLANE IJ) Inject as directed every 6 (six) months.    . hydrocortisone (ANUSOL-HC) 25 MG suppository Place 1 suppository (25 mg total) rectally at bedtime as needed for hemorrhoids or itching (one per rectum daily as needed for hemorrhoids). 30 suppository 3  . hydrOXYzine (ATARAX/VISTARIL) 10 MG tablet Take 1 tablet (10 mg total) by mouth 3 (three) times daily as needed. 30 tablet 0  . ibuprofen (ADVIL,MOTRIN) 200 MG tablet Take 200 mg by  mouth as needed.      . predniSONE (DELTASONE) 10 MG tablet As directed 60 tablet 0  . PREMARIN vaginal cream INSERT 1 GRAM VAGINALLY ONCE A WEEK AS DIRECTED 30 g 1  . ranitidine (ZANTAC) 150 MG tablet Take 1 tablet (150 mg total) by mouth 2 (two) times daily. 60 tablet 0  . tretinoin (RETIN-A) 0.05 % cream Apply 1 application topically at bedtime.     No current facility-administered medications on file prior to visit.    Allergies  Allergen Reactions  . Codeine Other (See Comments)    Doesn't remeber  . Epinephrine     Heart racing   . Penicillins Other (See Comments)    Doesn't remember   . Sulfonamide Derivatives Other (See Comments)    Doesn't remember   . Zithromax [Azithromycin] Rash    Review of Systems  Review of Systems  Constitutional: Negative for fever and malaise/fatigue.  HENT: Negative for congestion.   Eyes: Negative for discharge.  Respiratory: Negative for shortness of breath.   Cardiovascular: Negative for chest pain, palpitations and leg swelling.  Gastrointestinal: Negative for nausea, abdominal pain and diarrhea.  Genitourinary: Negative for dysuria.  Musculoskeletal: Negative for falls.  Skin: Positive for itching and rash.  Neurological: Negative for loss of consciousness and headaches.  Endo/Heme/Allergies: Negative for polydipsia.  Psychiatric/Behavioral: Negative for depression and suicidal ideas. The patient is not nervous/anxious and does not have insomnia.     Objective  BP 136/80 mmHg  Pulse 75  Temp(Src) 98.2 F (36.8 C) (Oral)  Resp 18  Wt 102 lb 1.3 oz (46.303 kg)  SpO2 99%  Physical Exam  Physical Exam  Constitutional: She is oriented to person, place, and time and well-developed, well-nourished, and in no distress. No distress.  HENT:  Head: Normocephalic and atraumatic.  Eyes: Conjunctivae are normal.  Neck: Neck supple. No thyromegaly present.  Cardiovascular: Normal rate, regular rhythm and normal heart sounds.   No  murmur heard. Pulmonary/Chest: Effort normal and breath sounds normal. She has no wheezes.  Abdominal: She exhibits no distension and no mass.  Musculoskeletal: She exhibits no edema.  Lymphadenopathy:    She has no cervical adenopathy.  Neurological: She is alert and oriented to person, place, and time.  Skin: Skin is warm and dry. Rash noted. She is not diaphoretic.  Scattered macular lesions, scaly and scabbed throughout  Psychiatric: Memory, affect and judgment normal.    Lab Results  Component Value Date   TSH 1.61 09/05/2011  Lab Results  Component Value Date   WBC 5.7 04/27/2014   HGB 13.2 04/27/2014   HCT 39.6 04/27/2014   MCV 92.4 04/27/2014   PLT 198.0 04/27/2014   Lab Results  Component Value Date   CREATININE 0.8 08/20/2013   BUN 22 08/20/2013   NA 138 08/20/2013   K 5.0 08/20/2013   CL 104 08/20/2013   CO2 29 08/20/2013   Lab Results  Component Value Date   ALT 23 09/11/2012   AST 20 09/11/2012   ALKPHOS 72 09/11/2012   BILITOT 0.6 09/11/2012   Lab Results  Component Value Date   CHOL 182 09/05/2011   Lab Results  Component Value Date   HDL 80.00 09/05/2011   Lab Results  Component Value Date   LDLCALC 93 09/05/2011   Lab Results  Component Value Date   TRIG 43.0 09/05/2011   Lab Results  Component Value Date   CHOLHDL 2 09/05/2011     Assessment & Plan  Allergic urticaria Patient in today for further discussion regarding the treatment of the rash she developed after a course of Azithromcyin. She has had a good response to the Hydroxyzine but notes excessive fatigue but decreased itching. She was unclear how to take the prednisone. We did not change the dosing except to take just a 1/2 tab daily at the end of the course for a few days

## 2014-08-23 ENCOUNTER — Telehealth: Payer: Self-pay | Admitting: *Deleted

## 2014-08-23 ENCOUNTER — Encounter: Payer: Self-pay | Admitting: Internal Medicine

## 2014-08-23 NOTE — Telephone Encounter (Signed)
[facility] - Client [facility] Patient Name: [name] Gender: Female DOB: 22-Oct-1950 Age: 64 Y 9 M 27 D Return Phone Number: [phone] (Primary), [phone] (Secondary) Address[place] [address] Client [facility] - Client Client Site [facility] - Night Physician Plotnikov, Alex Contact Type Call Call Type Triage / Clinical Relationship To Patient Self Return Phone Number [phone] (Secondary) Chief Complaint Heart palpitations or irregular heartbeat Initial Comment Caller states on Prednisone, been having rapid heartbeat Nurse Assessment Guidelines Guideline Title Affirmed Question Affirmed Notes Nurse Date/Time ([place] Time) Disp. Time Eilene Ghazi Time) Disposition Final User 08/22/2014 11:26:22 AM Attempt made - line busy Conner, RN, Emmaline Kluver 08/22/2014 11:46:52 AM FINAL ATTEMPT MADE - message left Yes Conner, RN, Lesa After Care Instructions Given Call Event Type User Date / Time Description

## 2014-08-24 ENCOUNTER — Telehealth: Payer: Self-pay | Admitting: Internal Medicine

## 2014-08-24 NOTE — Telephone Encounter (Signed)
Pt called in said meds that Dr hopper put her on it not working.  Can she go back to old meds for her reflux    She is requesting a call back when you get a chance.

## 2014-08-25 NOTE — Telephone Encounter (Signed)
Pt informed

## 2014-08-25 NOTE — Telephone Encounter (Signed)
Pls add OTC Nexium 2 caps q am Thx

## 2014-08-26 ENCOUNTER — Encounter: Payer: Self-pay | Admitting: Internal Medicine

## 2014-08-26 ENCOUNTER — Ambulatory Visit (INDEPENDENT_AMBULATORY_CARE_PROVIDER_SITE_OTHER): Payer: BLUE CROSS/BLUE SHIELD | Admitting: Internal Medicine

## 2014-08-26 VITALS — BP 130/80 | HR 76 | Temp 98.5°F | Wt 102.2 lb

## 2014-08-26 DIAGNOSIS — K219 Gastro-esophageal reflux disease without esophagitis: Secondary | ICD-10-CM

## 2014-08-26 DIAGNOSIS — R05 Cough: Secondary | ICD-10-CM

## 2014-08-26 DIAGNOSIS — Z0000 Encounter for general adult medical examination without abnormal findings: Secondary | ICD-10-CM

## 2014-08-26 DIAGNOSIS — R058 Other specified cough: Secondary | ICD-10-CM

## 2014-08-26 MED ORDER — PANTOPRAZOLE SODIUM 40 MG PO TBEC: 40.0000 mg | DELAYED_RELEASE_TABLET | Freq: Every day | ORAL | Status: DC

## 2014-08-26 MED ORDER — FLUOCINONIDE 0.05 % EX CREA: 1.0000 "application " | TOPICAL_CREAM | Freq: Two times a day (BID) | CUTANEOUS | Status: DC

## 2014-08-26 NOTE — Assessment & Plan Note (Signed)
Protonix x 2-3 wks Then switch to Zantac

## 2014-08-26 NOTE — Assessment & Plan Note (Signed)
Better  

## 2014-08-26 NOTE — Progress Notes (Signed)
Pre visit review using our clinic review tool, if applicable. No additional management support is needed unless otherwise documented below in the visit note. 

## 2014-08-26 NOTE — Progress Notes (Signed)
   Subjective:    HPI  F/u rash and itching - feeling better, Hydroxyzine helped. F/ palpitations w/Prednisone  Pt had a Zpac 2/22-2/27 for a sinusitis and a Depomedrol on 2/22 at Dr Seward Meth for sinusitis  F/u grief, stress, HAs  F/u LBP, dense breasts on mammo, microhematuria - not new     Review of Systems  Constitutional: Negative for chills, activity change, appetite change, fatigue and unexpected weight change.  HENT: Negative for congestion, mouth sores and sinus pressure.   Eyes: Negative for visual disturbance.  Respiratory: Negative for cough and chest tightness.   Gastrointestinal: Negative for nausea and abdominal pain.  Genitourinary: Negative for frequency, difficulty urinating and vaginal pain.  Musculoskeletal: Negative for back pain and gait problem.  Skin: Negative for pallor and rash.  Neurological: Negative for dizziness, tremors, weakness, numbness and headaches.  Psychiatric/Behavioral: Positive for sleep disturbance. Negative for suicidal ideas and confusion. The patient is nervous/anxious.   rash     Objective:   Physical Exam  Constitutional: She appears well-developed. No distress.  HENT:  Head: Normocephalic.  Right Ear: External ear normal.  Left Ear: External ear normal.  Nose: Nose normal.  Mouth/Throat: Oropharynx is clear and moist.  Eyes: Conjunctivae are normal. Pupils are equal, round, and reactive to light. Right eye exhibits no discharge. Left eye exhibits no discharge.  Neck: Normal range of motion. Neck supple. No JVD present. No tracheal deviation present. No thyromegaly present.  Cardiovascular: Normal rate, regular rhythm and normal heart sounds.   Pulmonary/Chest: No stridor. No respiratory distress. She has no wheezes.  Abdominal: Soft. Bowel sounds are normal. She exhibits no distension and no mass. There is no tenderness. There is no rebound and no guarding.  Musculoskeletal: She exhibits no edema or tenderness.    Lymphadenopathy:    She has no cervical adenopathy.  Neurological: She displays normal reflexes. No cranial nerve deficit. She exhibits normal muscle tone. Coordination normal.  Skin: No rash noted. No erythema.  Psychiatric: Her behavior is normal. Judgment and thought content normal.   Less extensive papular rash on trunk and extremities - resolving; no new lesions       Assessment & Plan:

## 2014-08-27 ENCOUNTER — Telehealth: Payer: Self-pay | Admitting: Internal Medicine

## 2014-08-27 MED ORDER — BENZONATATE 100 MG PO CAPS: 100.0000 mg | ORAL_CAPSULE | Freq: Two times a day (BID) | ORAL | Status: DC | PRN

## 2014-08-27 NOTE — Telephone Encounter (Signed)
Ok Done Thx 

## 2014-08-27 NOTE — Telephone Encounter (Signed)
She is requesting tessalon perles.

## 2014-08-27 NOTE — Telephone Encounter (Signed)
Patient would like something called in to [facility] at [facility] for cough

## 2014-08-30 ENCOUNTER — Ambulatory Visit (INDEPENDENT_AMBULATORY_CARE_PROVIDER_SITE_OTHER): Payer: BLUE CROSS/BLUE SHIELD | Admitting: Cardiovascular Disease

## 2014-08-30 ENCOUNTER — Encounter: Payer: Self-pay | Admitting: Cardiovascular Disease

## 2014-08-30 VITALS — BP 126/70 | HR 63 | Ht 62.0 in | Wt 101.8 lb

## 2014-08-30 DIAGNOSIS — I351 Nonrheumatic aortic (valve) insufficiency: Secondary | ICD-10-CM

## 2014-08-30 DIAGNOSIS — I493 Ventricular premature depolarization: Secondary | ICD-10-CM

## 2014-08-30 NOTE — Progress Notes (Signed)
History of Present Illness: 64 yo female with history of premature atrial contractions, palpitations, vasovagal syncope, mild aortic valve insufficiency, mild mitral regurgitation, IBS, GERD here today for cardiac follow up. She has been followed in the past by Dr. Verl Blalock. Echo 09/07/13 with with mild AI, mild MR, normal LV function. Exercise stress test 05/07/14 with good exercise tolerance, no ischemic EKG changes and no chest pain with exercise. She had an allergic reaction to Z-pack in February 2016. She is on Prednisone per primary care.   She is here for follow up. She tells me today that she has been doing well. She is very active. She works out every day. No chest pain or SOB. Occasional skipped beats and palpitations. Last for a few seconds.   Primary Care Physician: Plotnikov  Past Medical History  Diagnosis Date  . Mild aortic insufficiency   . Mild tricuspid regurgitation   . Internal hemorrhoids without mention of complication   . Unspecified constipation   . Irritable bowel syndrome   . Hemorrhage of rectum and anus   . Microscopic hematuria   . Personal history of urinary calculi   . Other plastic surgery for unacceptable cosmetic appearance     Inj tx fllers/expander  . Esophageal reflux   . Postmenopausal atrophic vaginitis   . Backache, unspecified   . Allergic rhinitis, cause unspecified   . Conversion disorder   . Calculus of kidney   . Migraine headache with aura   . Benign fasciculation-cramp syndrome 12/03/2012     Worsening with  stress, fatigue .  [name] Osteopenia   . Premature atrial contractions   . Vasovagal syncope   . GERD (gastroesophageal reflux disease)   . PVC (premature ventricular contraction)     a. Event monitor 2013.    Past Surgical History  Procedure Laterality Date  . Cosmetic procedures w/injection therapy    . Wisdom tooth extraction    . Breast biopsy Right     Current Outpatient Prescriptions  Medication Sig Dispense Refill  .  ALREX 0.2 % SUSP Apply 0.2 drops to eye as needed.     . benzonatate (TESSALON) 100 MG capsule Take 1-2 capsules (100-200 mg total) by mouth 2 (two) times daily as needed for cough. 60 capsule 0  . Botulinum Toxin Type A, Cosm, (BOTOX COSMETIC) 50 UNITS SOLR Inject 4-6 Units into the muscle. 3 month  injection    . Diclofenac Sodium 2 % SOLN Apply twice daily. 112 g 1  . EPIPEN 2-PAK 0.3 MG/0.3ML SOAJ injection See admin instructions.  1  . fluocinonide cream (LIDEX) 1.88 % Apply 1 application topically 2 (two) times daily. 90 g 2  . fluticasone (FLONASE) 50 MCG/ACT nasal spray Place 1 spray into both nostrils as needed.   5  . FML S.O.P. 0.1 % ophthalmic ointment Place 1 application into the left eye 3 (three) times daily.     [name] Hyaluronic Acid (RESTYLANE IJ) Inject as directed every 6 (six) months.    . hydrocortisone (ANUSOL-HC) 25 MG suppository Place 1 suppository (25 mg total) rectally at bedtime as needed for hemorrhoids or itching (one per rectum daily as needed for hemorrhoids). 30 suppository 3  . hydrOXYzine (ATARAX/VISTARIL) 10 MG tablet Take 1 tablet (10 mg total) by mouth 3 (three) times daily as needed. 30 tablet 0  . ibuprofen (ADVIL,MOTRIN) 200 MG tablet Take 200 mg by mouth as needed.      . pantoprazole (PROTONIX) 40 MG tablet Take 1  tablet (40 mg total) by mouth daily. 30 tablet 11  . predniSONE (DELTASONE) 10 MG tablet As directed 60 tablet 0  . PREMARIN vaginal cream INSERT 1 GRAM VAGINALLY ONCE A WEEK AS DIRECTED 30 g 1  . tretinoin (RETIN-A) 0.05 % cream Apply 1 application topically at bedtime.     No current facility-administered medications for this visit.    Allergies  Allergen Reactions  . Codeine Other (See Comments)    Doesn't remeber  . Epinephrine     Heart racing   . Penicillins Other (See Comments)    Doesn't remember   . Sulfonamide Derivatives Other (See Comments)    Doesn't remember   . Zithromax [Azithromycin] Rash    History   Social History    . Marital Status: Married    Spouse Name: [name]  . Number of Children: 0  . Years of Education: 14   Occupational History  . OFFICE liasion   . [employer]   . [employer]ICE [employer]    Social History Main Topics  . Smoking status: Never Smoker   . Smokeless tobacco: Never Used  . Alcohol Use: No  . Drug Use: No  . Sexual Activity: Not on file   Other Topics Concern  . Not on file   Social History Narrative   [employer]  in [place]. Married '90 - marriage is in very good shape '12, No children.  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 Offerred medical services for them if needed (Nov '11)   Patient is married [name]) and lives at home with her husband.   Patient is working Administrator, arts work.   Patient has a high school education.   Patient is right-handed.   Patient does not drink any caffeine.          Family History  Problem Relation Age of Onset  . Hyperlipidemia Father   . COPD Father     Smoker  . Hyperlipidemia Mother   . Diabetes Neg Hx   . Colon cancer Neg Hx   . Breast cancer Neg Hx   . Coronary artery disease Neg Hx   . Cancer Neg Hx     breast or colon  . Hypertension Father   . Hypertension Mother     Review of Systems:  As stated in the HPI and otherwise negative.   BP 126/70 mmHg  Pulse 63  Ht 5\' 2"  (1.575 m)  Wt 101 lb 12.8 oz (46.176 kg)  BMI 18.61 kg/m2  Physical Examination: General: Well developed, well nourished, NAD HEENT: OP clear, mucus membranes moist SKIN: warm, dry. No rashes. Neuro: No focal deficits Musculoskeletal: Muscle strength 5/5 all ext Psychiatric: Mood and affect normal Neck: No JVD, no carotid bruits, no thyromegaly, no lymphadenopathy. Lungs:Clear bilaterally, no wheezes, rhonci, crackles Cardiovascular: Regular rate and rhythm. No murmurs, gallops or rubs. Abdomen:Soft. Bowel sounds present. Non-tender.  Extremities:  No lower extremity edema. Pulses are 2 + in the bilateral DP/PT.  Echo 09/07/13: Left ventricle: The cavity size was normal. Systolic function was vigorous. The estimated ejection fraction was in the range of 65% to 70%. Wall motion was normal; there were no regional wall motion abnormalities. The pulmonary vein flow pattern was normal. Left ventricular diastolic function parameters were normal. There was no evidence of elevated ventricular filling pressure by Doppler parameters. - Aortic valve: Mild regurgitation. - Mitral valve: Structurally normal valve. No  regurgitation. - Left atrium: The atrium was normal in size. - Right ventricle: Systolic function was normal. - Tricuspid valve: Mild regurgitation. - Pulmonic valve: Structurally normal valve. No regurgitation. - Pulmonary arteries: Systolic pressure was within the normal range. - Inferior vena cava: The vessel was normal in size. - Pericardium, extracardiac: There was no pericardial effusion.  Assessment and Plan:   1. Palpitations: She is known to have PACs/PVCs. Event monitor 2013 with PVCs. No change recently. Rare palpitations. Avoid stimulants.   2. Aortic valve insufficiency: Mild by echo 2015.

## 2014-08-30 NOTE — Telephone Encounter (Signed)
Left detailed mess informing pt.  

## 2014-08-30 NOTE — Patient Instructions (Signed)
Your physician wants you to follow-up in:  12 months.  You will receive a reminder letter in the mail two months in advance. If you don't receive a letter, please call our office to schedule the follow-up appointment.   

## 2014-09-02 ENCOUNTER — Telehealth: Payer: Self-pay

## 2014-09-02 NOTE — Telephone Encounter (Signed)
Spoke with patient and scheduled appointment with [name] for 09/07/2014 at 8:30am.  Patient acknowledged and understood.

## 2014-09-02 NOTE — Telephone Encounter (Signed)
Lm message on vm for patient to call and schedule appointment with extender per Dr. Henrene Pastor.

## 2014-09-02 NOTE — Telephone Encounter (Signed)
Have her see an extender to evaluate

## 2014-09-02 NOTE — Telephone Encounter (Signed)
Patient called to share that is still seeing some bright red blood in her stool.  She still experiences some constipation but states that she sees the blood even if her stools are loose.

## 2014-09-07 ENCOUNTER — Ambulatory Visit (INDEPENDENT_AMBULATORY_CARE_PROVIDER_SITE_OTHER): Payer: BLUE CROSS/BLUE SHIELD | Admitting: Gastroenterology

## 2014-09-07 ENCOUNTER — Encounter: Payer: Self-pay | Admitting: Gastroenterology

## 2014-09-07 VITALS — BP 108/64 | HR 68 | Ht 61.5 in | Wt 101.0 lb

## 2014-09-07 DIAGNOSIS — K625 Hemorrhage of anus and rectum: Secondary | ICD-10-CM

## 2014-09-07 DIAGNOSIS — K648 Other hemorrhoids: Secondary | ICD-10-CM

## 2014-09-07 DIAGNOSIS — K589 Irritable bowel syndrome without diarrhea: Secondary | ICD-10-CM

## 2014-09-07 MED ORDER — HYDROCORTISONE ACETATE 25 MG RE SUPP: 25.0000 mg | Freq: Two times a day (BID) | RECTAL | Status: DC

## 2014-09-07 NOTE — Patient Instructions (Signed)
We have sent the following medications to your pharmacy for you to pick up at your convenience: Hydrocortisone Supp

## 2014-09-07 NOTE — Progress Notes (Signed)
     09/07/2014 [name] [id number] 01-15-1951   History of Present Illness:  This is a pleasant 64 year old female who is known to Dr. Henrene Pastor for colonoscopy most recently in 05/2013 that was normal except for internal hemorrhoids.  She has been treated for rectal bleeding related to her hemorrhoids in the past and comes to our office today with similar complaints.  Says that on occasion she will see a streak of bright red blood on the toilet paper, but a couple of times per year she will see a larger amount of bright red blood in the toilet bowl with a bowel movement.  This resolves after one occurrence and then does not happen again for several months.  This happened one day last week, which prompted her to make this visit today.  She has IBS with alternating constipation and diarrhea as well for which she takes fiber supplements.  Current Medications, Allergies, Past Medical History, Past Surgical History, Family History and Social History were reviewed in [employer] record.   Physical Exam: BP 108/64 mmHg  Pulse 68  Ht 5' 1.5" (1.562 m)  Wt 101 lb (45.813 kg)  BMI 18.78 kg/m2 General: Well developed white female in no acute distress Head: Normocephalic and atraumatic Eyes:  Sclerae anicteric, conjunctiva pink  Ears: Normal auditory acuity Lungs: Clear throughout to auscultation Heart: Regular rate and rhythm Abdomen: Soft, non-distended.  Normal bowel sounds.  Non-tender.   Rectal:  No external hemorrhoids noted.  DRE did not reveal any masses.  Light brown, heme negative stool noted on exam glove.   Musculoskeletal: Symmetrical with no gross deformities  Extremities: No edema  Neurological: Alert oriented x 4, grossly nonfocal Psychological:  Alert and cooperative. Normal mood and affect  Assessment and Recommendations: -Internal hemorrhoids with rectal bleeding:  Will try hydrocortisone suppositories 25 mg BID for 7 days.  Will come back prn,  especially if noticing increasing in the amount or frequency of bleeding at which time we could consider banding. -IBS with alternating constipation and diarrhea:  Continue daily fiber supplementation.  Can change to or add Miralax daily if having more constipation than loose stools.

## 2014-09-07 NOTE — Progress Notes (Signed)
Agree with assessment and plans 

## 2014-09-08 ENCOUNTER — Telehealth: Payer: Self-pay | Admitting: *Deleted

## 2014-09-08 NOTE — Telephone Encounter (Signed)
[facility] [facility] - Client [name] Patient Name: [name] Gender: Female DOB: 1950-12-22 Age: 64 [name] 10 M 12 D Return Phone Number: [phone] (Primary), [phone] (Secondary) [place] [address] Client [facility] - Client Client Site [place] - Night Physician Plotnikov, Alex Contact Type Call Call Type Triage / Clinical Relationship To Patient Self Return Phone Number [phone] (Secondary) Chief Complaint Medication reaction Initial Comment Caller states she has been taking Prednisone and has questions about taking it today. Caller states she got sick yesterday after taking it. PreDisposition Go to ED Nurse Assessment Guidelines Guideline Title Affirmed Question Affirmed Notes Nurse Date/Time Eilene Ghazi Time) Face Swelling Swelling began after taking a drug Donalynn Furlong, RN, Myna Hidalgo 09/05/2014 9:09:13 AM Disp. Time Eilene Ghazi Time) Disposition Final User 09/05/2014 9:14:19 AM See Physician within 4 Hours (or PCP triage) Yes Donalynn Furlong, RN, Myna Hidalgo Caller Understands: Yes Disagree/Comply: Comply Care Advice Given Per Guideline SEE PHYSICIAN WITHIN 4 HOURS (or PCP triage): BRING MEDICINES: * Please bring a list of your current medicines when you go to see the doctor. * It is also a good idea to bring the pill bottles too. This will help the doctor to make certain you are taking the right medicines and the right dose. ANTIHISTAMINE (E.G., BENADRYL): * Take an antihistamine by mouth to reduce the swelling and to help with any itching. * Benadryl (diphenhydramine) every 6 hours is best. Adult dose is 25-50 mg. Take 2 or 3 times. * Examples include diphenhydramine (Benadryl) and chlorpheniramine (Chlortrimeton, Chlor-tripolon) * May cause sleepiness. Do not drink alcohol, drive or operate dangerous machinery while taking antihistamines. CALL BACK IF:  * You become worse. CARE ADVICE given per Face Swelling (Adult) guideline. * If Benadryl is not available, use any hay fever or cold medicine that contains an antihistamine. After Care Instructions Given Call Event Type User Date / Time Description PLEASE NOTE: All timestamps contained within this report are represented as [place] Standard Time. CONFIDENTIALTY NOTICE: This fax transmission is intended only for the addressee. It contains information that is legally privileged, confidential or otherwise protected from use or disclosure. If you are not the intended recipient, you are strictly prohibited from reviewing, disclosing, copying using or disseminating any of this information or taking any action in reliance on or regarding this information. If you have received this fax in error, please notify us immediately by telephone so that we can arrange for its return to [place]. Phone: [phone], Toll-Free: [phone], Fax: [phone] Page: 2 of 2 Call Id: [id number] Referrals GO TO FACILITY UNDECIDED

## 2014-09-09 ENCOUNTER — Ambulatory Visit (INDEPENDENT_AMBULATORY_CARE_PROVIDER_SITE_OTHER): Payer: BLUE CROSS/BLUE SHIELD | Admitting: Internal Medicine

## 2014-09-09 ENCOUNTER — Encounter: Payer: Self-pay | Admitting: Internal Medicine

## 2014-09-09 VITALS — BP 110/78 | HR 73 | Ht 62.0 in | Wt 101.0 lb

## 2014-09-09 DIAGNOSIS — R21 Rash and other nonspecific skin eruption: Secondary | ICD-10-CM

## 2014-09-09 DIAGNOSIS — Z0000 Encounter for general adult medical examination without abnormal findings: Secondary | ICD-10-CM | POA: Diagnosis not present

## 2014-09-09 DIAGNOSIS — R202 Paresthesia of skin: Secondary | ICD-10-CM

## 2014-09-09 NOTE — Progress Notes (Signed)
Pre visit review using our clinic review tool, if applicable. No additional management support is needed unless otherwise documented below in the visit note. 

## 2014-09-09 NOTE — Assessment & Plan Note (Signed)
We discussed age appropriate health related issues, including available/recomended screening tests and vaccinations. We discussed a need for adhering to healthy diet and exercise. Labs/EKG were reviewed/ordered. All questions were answered. Mammo q 12 mo Eye appt q 12 mo 

## 2014-09-09 NOTE — Assessment & Plan Note (Signed)
Resolved

## 2014-09-09 NOTE — Progress Notes (Signed)
   Subjective:    Patient ID: [name], female    DOB: [birth year], 64 y.o.   MRN: [id number]  HPI  The patient is here for a wellness exam.   Pt finished Prednisone on Mon. Rash has resolved  BP Readings from Last 3 Encounters:  09/09/14 110/78  09/07/14 108/64  08/30/14 126/70   Wt Readings from Last 3 Encounters:  09/09/14 101 lb (45.813 kg)  09/07/14 101 lb (45.813 kg)  08/30/14 101 lb 12.8 oz (46.176 kg)      Review of Systems  Constitutional: Negative for fever, chills, diaphoresis, activity change, appetite change, fatigue and unexpected weight change.  HENT: Negative for congestion, dental problem, ear pain, hearing loss, mouth sores, postnasal drip, sinus pressure, sneezing, sore throat and voice change.   Eyes: Negative for pain and visual disturbance.  Respiratory: Negative for cough, chest tightness, wheezing and stridor.   Cardiovascular: Negative for chest pain, palpitations and leg swelling.  Gastrointestinal: Negative for nausea, vomiting, abdominal pain, blood in stool, abdominal distention and rectal pain.  Genitourinary: Negative for dysuria, frequency, hematuria, decreased urine volume, vaginal bleeding, vaginal discharge, difficulty urinating, vaginal pain and menstrual problem.  Musculoskeletal: Negative for back pain, joint swelling, arthralgias, gait problem and neck pain.  Skin: Negative for color change, rash and wound.  Neurological: Negative for dizziness, tremors, syncope, speech difficulty, weakness and light-headedness.  Hematological: Negative for adenopathy.  Psychiatric/Behavioral: Negative for suicidal ideas, hallucinations, behavioral problems, confusion, sleep disturbance, dysphoric mood and decreased concentration. The patient is not nervous/anxious and is not hyperactive.        Objective:   Physical Exam  Constitutional: She appears well-developed. No distress.  HENT:  Head: Normocephalic.  Right Ear: External ear normal.    Left Ear: External ear normal.  Nose: Nose normal.  Mouth/Throat: Oropharynx is clear and moist.  Eyes: Conjunctivae are normal. Pupils are equal, round, and reactive to light. Right eye exhibits no discharge. Left eye exhibits no discharge.  Neck: Normal [name] of motion. Neck supple. No JVD present. No tracheal deviation present. No thyromegaly present.  Cardiovascular: Normal rate, regular rhythm and normal heart sounds.   Pulmonary/Chest: No stridor. No respiratory distress. She has no wheezes.  Abdominal: Soft. Bowel sounds are normal. She exhibits no distension and no mass. There is no tenderness. There is no rebound and no guarding.  Musculoskeletal: She exhibits no edema or tenderness.  Lymphadenopathy:    She has no cervical adenopathy.  Neurological: She displays normal reflexes. No cranial nerve deficit. She exhibits normal muscle tone. Coordination normal.  Skin: No rash noted. No erythema.  Psychiatric: She has a normal mood and affect. Her behavior is normal. Judgment and thought content normal.  L thigh 6 mm growth   Lab Results  Component Value Date   WBC 9.8 08/22/2014   HGB 13.5 08/22/2014   HCT 40.8 08/22/2014   PLT 218 08/22/2014   GLUCOSE 221* 08/22/2014   CHOL 182 09/05/2011   TRIG 43.0 09/05/2011   HDL 80.00 09/05/2011   LDLCALC 93 09/05/2011   ALT 23 09/11/2012   AST 20 09/11/2012   NA 136 08/22/2014   K 4.8 08/22/2014   CL 108 08/22/2014   CREATININE 0.76 08/22/2014   BUN 20 08/22/2014   CO2 27 08/22/2014   TSH 1.61 09/05/2011        Assessment & Plan:

## 2014-09-13 ENCOUNTER — Other Ambulatory Visit: Payer: Self-pay | Admitting: Dermatology

## 2014-09-15 ENCOUNTER — Encounter: Payer: Self-pay | Admitting: Internal Medicine

## 2014-09-20 ENCOUNTER — Other Ambulatory Visit: Payer: Self-pay

## 2014-09-20 NOTE — Telephone Encounter (Signed)
Premarin refill request for Premarin Vaginal Cream 30GM to [facility].   Is this okay to fill?

## 2014-09-21 MED ORDER — ESTROGENS, CONJUGATED 0.625 MG/GM VA CREA: TOPICAL_CREAM | VAGINAL | Status: DC

## 2014-09-24 ENCOUNTER — Other Ambulatory Visit (INDEPENDENT_AMBULATORY_CARE_PROVIDER_SITE_OTHER): Payer: BLUE CROSS/BLUE SHIELD

## 2014-09-24 DIAGNOSIS — R21 Rash and other nonspecific skin eruption: Secondary | ICD-10-CM

## 2014-09-24 DIAGNOSIS — Z0000 Encounter for general adult medical examination without abnormal findings: Secondary | ICD-10-CM

## 2014-09-24 DIAGNOSIS — R202 Paresthesia of skin: Secondary | ICD-10-CM | POA: Diagnosis not present

## 2014-09-24 LAB — CBC WITH DIFFERENTIAL/PLATELET
Basophils Absolute: 0 10*3/uL (ref 0.0–0.1)
Basophils Relative: 0.9 % (ref 0.0–3.0)
EOS ABS: 0.1 10*3/uL (ref 0.0–0.7)
EOS PCT: 1.9 % (ref 0.0–5.0)
HCT: 37.9 % (ref 36.0–46.0)
Hemoglobin: 13.2 g/dL (ref 12.0–15.0)
Lymphocytes Relative: 33.4 % (ref 12.0–46.0)
Lymphs Abs: 1.3 10*3/uL (ref 0.7–4.0)
MCHC: 34.8 g/dL (ref 30.0–36.0)
MCV: 90 fl (ref 78.0–100.0)
MONO ABS: 0.4 10*3/uL (ref 0.1–1.0)
MONOS PCT: 10.1 % (ref 3.0–12.0)
NEUTROS PCT: 53.7 % (ref 43.0–77.0)
Neutro Abs: 2.1 10*3/uL (ref 1.4–7.7)
Platelets: 226 10*3/uL (ref 150.0–400.0)
RBC: 4.21 Mil/uL (ref 3.87–5.11)
RDW: 13.1 % (ref 11.5–15.5)
WBC: 4 10*3/uL (ref 4.0–10.5)

## 2014-09-24 LAB — URINALYSIS, ROUTINE W REFLEX MICROSCOPIC
Bilirubin Urine: NEGATIVE
Ketones, ur: NEGATIVE
Leukocytes, UA: NEGATIVE
Nitrite: NEGATIVE
Specific Gravity, Urine: 1.025 (ref 1.000–1.030)
Total Protein, Urine: NEGATIVE
UROBILINOGEN UA: 0.2 (ref 0.0–1.0)
Urine Glucose: NEGATIVE
pH: 5.5 (ref 5.0–8.0)

## 2014-09-24 LAB — LIPID PANEL
CHOLESTEROL: 194 mg/dL (ref 0–200)
HDL: 67.6 mg/dL (ref >39.00)
LDL CALC: 112 mg/dL — AB (ref 0–99)
NONHDL: 126.4
Total CHOL/HDL Ratio: 3
Triglycerides: 73 mg/dL (ref 0.0–149.0)
VLDL: 14.6 mg/dL (ref 0.0–40.0)

## 2014-09-24 LAB — HEPATIC FUNCTION PANEL
ALT: 15 U/L (ref 0–35)
AST: 14 U/L (ref 0–37)
Albumin: 4.1 g/dL (ref 3.5–5.2)
Alkaline Phosphatase: 66 U/L (ref 39–117)
BILIRUBIN DIRECT: 0.1 mg/dL (ref 0.0–0.3)
BILIRUBIN TOTAL: 0.7 mg/dL (ref 0.2–1.2)
Total Protein: 7 g/dL (ref 6.0–8.3)

## 2014-09-24 LAB — BASIC METABOLIC PANEL
BUN: 22 mg/dL (ref 6–23)
CO2: 30 mEq/L (ref 19–32)
Calcium: 9.9 mg/dL (ref 8.4–10.5)
Chloride: 107 mEq/L (ref 96–112)
Creatinine, Ser: 0.84 mg/dL (ref 0.40–1.20)
GFR: 73.04 mL/min (ref >60.00)
Glucose, Bld: 91 mg/dL (ref 70–99)
POTASSIUM: 5.6 meq/L — AB (ref 3.5–5.1)
Sodium: 140 mEq/L (ref 135–145)

## 2014-09-24 LAB — TSH: TSH: 1.78 u[IU]/mL (ref 0.35–4.50)

## 2014-09-24 LAB — MAGNESIUM: Magnesium: 2.1 mg/dL (ref 1.5–2.5)

## 2014-09-24 LAB — VITAMIN B12: VITAMIN B 12: 242 pg/mL (ref 211–911)

## 2014-09-27 ENCOUNTER — Telehealth: Payer: Self-pay | Admitting: Internal Medicine

## 2014-09-27 DIAGNOSIS — E875 Hyperkalemia: Secondary | ICD-10-CM

## 2014-09-27 NOTE — Telephone Encounter (Signed)
Pt called stated that Dr. Camila Li want her to have potassium done but no order in the system. Please check and call pt back

## 2014-09-27 NOTE — Telephone Encounter (Signed)
Per lab report md stated will recheck potassium due to level being elevated. Inform pt entered lab...[name]

## 2014-09-28 ENCOUNTER — Other Ambulatory Visit (INDEPENDENT_AMBULATORY_CARE_PROVIDER_SITE_OTHER): Payer: BLUE CROSS/BLUE SHIELD

## 2014-09-28 DIAGNOSIS — E875 Hyperkalemia: Secondary | ICD-10-CM | POA: Diagnosis not present

## 2014-09-28 LAB — POTASSIUM: Potassium: 4.2 mEq/L (ref 3.5–5.1)

## 2014-10-11 ENCOUNTER — Encounter: Payer: Self-pay | Admitting: Podiatry

## 2014-10-11 ENCOUNTER — Ambulatory Visit (INDEPENDENT_AMBULATORY_CARE_PROVIDER_SITE_OTHER): Payer: BLUE CROSS/BLUE SHIELD | Admitting: Podiatry

## 2014-10-11 VITALS — BP 111/70 | HR 70 | Resp 18

## 2014-10-11 DIAGNOSIS — M2010 Hallux valgus (acquired), unspecified foot: Secondary | ICD-10-CM | POA: Diagnosis not present

## 2014-10-11 DIAGNOSIS — L84 Corns and callosities: Secondary | ICD-10-CM | POA: Diagnosis not present

## 2014-10-12 NOTE — Progress Notes (Signed)
Subjective:     Patient ID: [name], female   DOB: October 13, 1950, 64 y.o.   MRN: [id number]  HPI patient presents stating I have these structural bunion deformities left over right but I know I need to get corrected at one point I have bad lesions on the bottom of both feet that get sore and my nails   Review of Systems     Objective:   Physical Exam Neurovascular status intact with thick keratotic lesion formation and structural bunion deformities present    Assessment:     Reviewed conditions and explained structural abnormalities to patient    Plan:     At this time I did discuss bunion correction reviewed the recovery process from surgery and debris did the lesions on both feet along with nail debridement and reappoint when she decides on surgery

## 2014-11-21 ENCOUNTER — Encounter: Payer: Self-pay | Admitting: Cardiovascular Disease

## 2014-12-14 ENCOUNTER — Ambulatory Visit: Payer: BC Managed Care – PPO | Admitting: Neurology

## 2015-02-03 ENCOUNTER — Ambulatory Visit (INDEPENDENT_AMBULATORY_CARE_PROVIDER_SITE_OTHER): Payer: BLUE CROSS/BLUE SHIELD | Admitting: Internal Medicine

## 2015-02-03 ENCOUNTER — Encounter: Payer: Self-pay | Admitting: Internal Medicine

## 2015-02-03 VITALS — BP 108/64 | HR 67 | Temp 98.6°F | Ht 62.0 in | Wt 105.0 lb

## 2015-02-03 DIAGNOSIS — R21 Rash and other nonspecific skin eruption: Secondary | ICD-10-CM | POA: Diagnosis not present

## 2015-02-03 MED ORDER — TRIAMCINOLONE ACETONIDE 0.1 % EX CREA: 1.0000 "application " | TOPICAL_CREAM | Freq: Two times a day (BID) | CUTANEOUS | Status: DC

## 2015-02-03 NOTE — Progress Notes (Signed)
Pre visit review using our clinic review tool, if applicable. No additional management support is needed unless otherwise documented below in the visit note. 

## 2015-02-03 NOTE — Progress Notes (Signed)
Subjective:    Patient ID: [name], female    DOB: 05/18/1951, 64 y.o.   MRN: [id number]  HPI Here after 4 days onset left medial ankle insect bite, quite itchy and has scratched apparently so that bruising has occurred around the site, No fever, did not see insect, not sure if was tick but who episode very quick, no rash , no red/tender/swelling or drainage.  Still itches Past Medical History  Diagnosis Date  . Mild aortic insufficiency   . Mild tricuspid regurgitation   . Internal hemorrhoids without mention of complication   . Unspecified constipation   . Irritable bowel syndrome   . Hemorrhage of rectum and anus   . Microscopic hematuria   . Personal history of urinary calculi   . Other plastic surgery for unacceptable cosmetic appearance     Inj tx fllers/expander  . Esophageal reflux   . Postmenopausal atrophic vaginitis   . Backache, unspecified   . Allergic rhinitis, cause unspecified   . Conversion disorder   . Calculus of kidney   . Migraine headache with aura   . Benign fasciculation-cramp syndrome 12/03/2012     Worsening with  stress, fatigue .  [name] Osteopenia   . Premature atrial contractions   . Vasovagal syncope   . GERD (gastroesophageal reflux disease)   . PVC (premature ventricular contraction)     a. Event monitor 2013.   Past Surgical History  Procedure Laterality Date  . Cosmetic procedures w/injection therapy    . Wisdom tooth extraction    . Breast biopsy Right     reports that she has never smoked. She has never used smokeless tobacco. She reports that she does not drink alcohol or use illicit drugs. family history includes COPD in her father; Hyperlipidemia in her father and mother; Hypertension in her father and mother. There is no history of Diabetes, Colon cancer, Breast cancer, Coronary artery disease, or Cancer. Allergies  Allergen Reactions  . Codeine Other (See Comments)    Doesn't remeber  . Epinephrine     Heart racing   .  Penicillins Other (See Comments)    Doesn't remember   . Sulfonamide Derivatives Other (See Comments)    Doesn't remember   . Zithromax [Azithromycin] Rash   Current Outpatient Prescriptions on File Prior to Visit  Medication Sig Dispense Refill  . ALREX 0.2 % SUSP Apply 0.2 drops to eye as needed.     . benzonatate (TESSALON) 100 MG capsule Take 1-2 capsules (100-200 mg total) by mouth 2 (two) times daily as needed for cough. 60 capsule 0  . Botulinum Toxin Type A, Cosm, (BOTOX COSMETIC) 50 UNITS SOLR Inject 4-6 Units into the muscle. 3 month  injection    . conjugated estrogens (PREMARIN) vaginal cream INSERT 1 GRAM VAGINALLY ONCE A WEEK AS DIRECTED 30 g 3  . Diclofenac Sodium 2 % SOLN Apply twice daily. 112 g 1  . EPIPEN 2-PAK 0.3 MG/0.3ML SOAJ injection See admin instructions.  1  . fluticasone (FLONASE) 50 MCG/ACT nasal spray Place 1 spray into both nostrils as needed.   5  . FML S.O.P. 0.1 % ophthalmic ointment Place 1 application into the left eye 3 (three) times daily.     [name] Hyaluronic Acid (RESTYLANE IJ) Inject as directed every 6 (six) months.    . hydrocortisone (ANUSOL-HC) 25 MG suppository Place 1 suppository (25 mg total) rectally 2 (two) times daily. 14 suppository 2  . hydrOXYzine (ATARAX/VISTARIL) 10 MG tablet Take 1  tablet (10 mg total) by mouth 3 (three) times daily as needed. 30 tablet 0  . ibuprofen (ADVIL,MOTRIN) 200 MG tablet Take 200 mg by mouth as needed.      . pantoprazole (PROTONIX) 40 MG tablet Take 1 tablet (40 mg total) by mouth daily. 30 tablet 11  . tretinoin (RETIN-A) 0.05 % cream Apply 1 application topically at bedtime.    . fluocinonide cream (LIDEX) 2.68 % Apply 1 application topically 2 (two) times daily. (Patient not taking: Reported on 02/03/2015) 90 g 2   No current facility-administered medications on file prior to visit.   Review of Systems All otherwise neg per pt     Objective:   Physical Exam BP 108/64 mmHg  Pulse 67  Temp(Src) 98.6 F  (37 C) (Oral)  Ht 5\' 2"  (1.575 m)  Wt 105 lb (47.628 kg)  BMI 19.20 kg/m2  SpO2 97% VS noted,  Constitutional: Pt appears in no significant distress HENT: Head: NCAT.  Right Ear: External ear normal.  Left Ear: External ear normal.  Eyes: . Pupils are equal, round, and reactive to light. Conjunctivae and EOM are normal Neck: Normal [name] of motion. Neck supple.  Cardiovascular: Normal rate and regular rhythm.   Pulmonary/Chest: Effort normal and breath sounds without rales or wheezing.  Neurological: Pt is alert. Not confused , motor grossly intact Skin: Skin is warm. No rash, no LE edema but has apparent 5 mm insect bite site mild erythema, nontender, 1 cm surrounding bruising noted Psychiatric: Pt behavior is normal. No agitation.     Assessment & Plan:

## 2015-02-03 NOTE — Patient Instructions (Signed)
Please take all new medication as prescribed - the cream  Please continue all other medications as before, and refills have been done if requested.  Please have the pharmacy call with any other refills you may need.  Please keep your appointments with your specialists as you may have planned   

## 2015-02-03 NOTE — Assessment & Plan Note (Signed)
C/w insect bite, not clearly tick , no rash or fever, ok for triam cr prn at site, bruising should heal without specific tx,  to f/u any worsening symptoms or concerns

## 2015-02-04 ENCOUNTER — Telehealth: Payer: Self-pay | Admitting: Internal Medicine

## 2015-02-04 NOTE — Telephone Encounter (Signed)
Pt informed

## 2015-02-04 NOTE — Telephone Encounter (Signed)
Patient called to advise that she has had company in for the last week. She has been eating foodthat she normally doesn't not eat such as fried foods. She currently takes pantoprazole (PROTONIX) 40 MG tablet [[id number]], but the symptoms are not being helped at this time from that med. She states that she has spoken with you about rantidine in the past. She is asking for direction. Please give her a call.

## 2015-02-04 NOTE — Telephone Encounter (Signed)
Take Pepcid AC Complete one twice a day in addition to Pantaprazole Thx

## 2015-02-23 ENCOUNTER — Ambulatory Visit (INDEPENDENT_AMBULATORY_CARE_PROVIDER_SITE_OTHER): Payer: BLUE CROSS/BLUE SHIELD | Admitting: Internal Medicine

## 2015-02-23 ENCOUNTER — Encounter: Payer: Self-pay | Admitting: Internal Medicine

## 2015-02-23 ENCOUNTER — Other Ambulatory Visit (INDEPENDENT_AMBULATORY_CARE_PROVIDER_SITE_OTHER): Payer: BLUE CROSS/BLUE SHIELD

## 2015-02-23 VITALS — BP 94/68 | HR 73 | Temp 98.4°F | Wt 105.0 lb

## 2015-02-23 DIAGNOSIS — R300 Dysuria: Secondary | ICD-10-CM | POA: Diagnosis not present

## 2015-02-23 LAB — CBC WITH DIFFERENTIAL/PLATELET
BASOS ABS: 0 10*3/uL (ref 0.0–0.1)
Basophils Relative: 0.7 % (ref 0.0–3.0)
EOS ABS: 0.1 10*3/uL (ref 0.0–0.7)
Eosinophils Relative: 2.3 % (ref 0.0–5.0)
HCT: 39.8 % (ref 36.0–46.0)
Hemoglobin: 13.4 g/dL (ref 12.0–15.0)
LYMPHS PCT: 30.5 % (ref 12.0–46.0)
Lymphs Abs: 1.1 10*3/uL (ref 0.7–4.0)
MCHC: 33.8 g/dL (ref 30.0–36.0)
MCV: 90.4 fl (ref 78.0–100.0)
MONO ABS: 0.4 10*3/uL (ref 0.1–1.0)
Monocytes Relative: 11 % (ref 3.0–12.0)
NEUTROS ABS: 2 10*3/uL (ref 1.4–7.7)
NEUTROS PCT: 55.5 % (ref 43.0–77.0)
PLATELETS: 183 10*3/uL (ref 150.0–400.0)
RBC: 4.4 Mil/uL (ref 3.87–5.11)
RDW: 13.1 % (ref 11.5–15.5)
WBC: 3.7 10*3/uL — ABNORMAL LOW (ref 4.0–10.5)

## 2015-02-23 LAB — SEDIMENTATION RATE: Sed Rate: 16 mm/hr (ref 0–22)

## 2015-02-23 LAB — URINALYSIS
BILIRUBIN URINE: NEGATIVE
KETONES UR: NEGATIVE
Leukocytes, UA: NEGATIVE
NITRITE: NEGATIVE
Specific Gravity, Urine: <=1.005 — AB (ref 1.000–1.030)
TOTAL PROTEIN, URINE-UPE24: NEGATIVE
Urine Glucose: NEGATIVE
Urobilinogen, UA: 0.2 (ref 0.0–1.0)
pH: 6.5 (ref 5.0–8.0)

## 2015-02-23 MED ORDER — CIPROFLOXACIN HCL 250 MG PO TABS
250.0000 mg | ORAL_TABLET | Freq: Two times a day (BID) | ORAL | Status: DC
Start: 2015-02-23 — End: 2015-03-08

## 2015-02-23 NOTE — Progress Notes (Signed)
Subjective:  Patient ID: [name], female    DOB: [birth year]  Age: 64 y.o. MRN: [id number]  CC: Abdominal Pain and Urinary Frequency   HPI  [name] presents for UTI sx's  X 1 week  Outpatient Prescriptions Prior to Visit  Medication Sig Dispense Refill  . ALREX 0.2 % SUSP Apply 0.2 drops to eye as needed.     . benzonatate (TESSALON) 100 MG capsule Take 1-2 capsules (100-200 mg total) by mouth 2 (two) times daily as needed for cough. 60 capsule 0  . Botulinum Toxin Type A, Cosm, (BOTOX COSMETIC) 50 UNITS SOLR Inject 4-6 Units into the muscle. 3 month  injection    . conjugated estrogens (PREMARIN) vaginal cream INSERT 1 GRAM VAGINALLY ONCE A WEEK AS DIRECTED 30 g 3  . Diclofenac Sodium 2 % SOLN Apply twice daily. 112 g 1  . EPIPEN 2-PAK 0.3 MG/0.3ML SOAJ injection See admin instructions.  1  . fluocinonide cream (LIDEX) 5.09 % Apply 1 application topically 2 (two) times daily. 90 g 2  . fluticasone (FLONASE) 50 MCG/ACT nasal spray Place 1 spray into both nostrils as needed.   5  . FML S.O.P. 0.1 % ophthalmic ointment Place 1 application into the left eye 3 (three) times daily.     [name] Hyaluronic Acid (RESTYLANE IJ) Inject as directed every 6 (six) months.    . hydrocortisone (ANUSOL-HC) 25 MG suppository Place 1 suppository (25 mg total) rectally 2 (two) times daily. 14 suppository 2  . hydrOXYzine (ATARAX/VISTARIL) 10 MG tablet Take 1 tablet (10 mg total) by mouth 3 (three) times daily as needed. 30 tablet 0  . ibuprofen (ADVIL,MOTRIN) 200 MG tablet Take 200 mg by mouth as needed.      . pantoprazole (PROTONIX) 40 MG tablet Take 1 tablet (40 mg total) by mouth daily. 30 tablet 11  . tretinoin (RETIN-A) 0.05 % cream Apply 1 application topically at bedtime.    . triamcinolone cream (KENALOG) 0.1 % Apply 1 application topically 2 (two) times daily. 30 g 0   No facility-administered medications prior to visit.    ROS Review of Systems  Constitutional: Negative for  chills, activity change, appetite change, fatigue and unexpected weight change.  HENT: Negative for congestion, mouth sores and sinus pressure.   Eyes: Negative for visual disturbance.  Respiratory: Negative for cough and chest tightness.   Gastrointestinal: Negative for nausea and abdominal pain.  Genitourinary: Positive for urgency. Negative for frequency, difficulty urinating and vaginal pain.  Musculoskeletal: Negative for back pain and gait problem.  Skin: Negative for pallor and rash.  Neurological: Negative for dizziness, tremors, weakness, numbness and headaches.  Psychiatric/Behavioral: Negative for confusion and sleep disturbance.    Objective:  BP 94/68 mmHg  Pulse 73  Temp(Src) 98.4 F (36.9 C) (Oral)  Wt 105 lb (47.628 kg)  SpO2 97%  BP Readings from Last 3 Encounters:  02/23/15 94/68  02/03/15 108/64  10/11/14 111/70    Wt Readings from Last 3 Encounters:  02/23/15 105 lb (47.628 kg)  02/03/15 105 lb (47.628 kg)  09/09/14 101 lb (45.813 kg)    Physical Exam  Constitutional: She appears well-developed. No distress.  HENT:  Head: Normocephalic.  Right Ear: External ear normal.  Left Ear: External ear normal.  Nose: Nose normal.  Mouth/Throat: Oropharynx is clear and moist.  Eyes: Conjunctivae are normal. Pupils are equal, round, and reactive to light. Right eye exhibits no discharge. Left eye exhibits no discharge.  Neck: Normal [name] of  motion. Neck supple. No JVD present. No tracheal deviation present. No thyromegaly present.  Cardiovascular: Normal rate, regular rhythm and normal heart sounds.   Pulmonary/Chest: No stridor. No respiratory distress. She has no wheezes.  Abdominal: Soft. Bowel sounds are normal. She exhibits no distension and no mass. There is no tenderness. There is no rebound and no guarding.  Musculoskeletal: She exhibits tenderness. She exhibits no edema.  Lymphadenopathy:    She has no cervical adenopathy.  Neurological: She displays  normal reflexes. No cranial nerve deficit. She exhibits normal muscle tone. Coordination normal.  Skin: No rash noted. No erythema.  mild suprapubic sensitivity to palpation  Lab Results  Component Value Date   WBC 3.7* 02/23/2015   HGB 13.4 02/23/2015   HCT 39.8 02/23/2015   PLT 183.0 02/23/2015   GLUCOSE 91 09/24/2014   CHOL 194 09/24/2014   TRIG 73.0 09/24/2014   HDL 67.60 09/24/2014   LDLCALC 112* 09/24/2014   ALT 15 09/24/2014   AST 14 09/24/2014   NA 140 09/24/2014   K 4.2 09/28/2014   CL 107 09/24/2014   CREATININE 0.84 09/24/2014   BUN 22 09/24/2014   CO2 30 09/24/2014   TSH 1.78 09/24/2014    No results found.  Assessment & Plan:   [name] was seen today for abdominal pain and urinary frequency.  Diagnoses and all orders for this visit:  Dysuria -     Urinalysis; Future -     Sedimentation rate; Future -     CBC with Differential/Platelet; Future  Other orders -     ciprofloxacin (CIPRO) 250 MG tablet; Take 1 tablet (250 mg total) by mouth 2 (two) times daily.  I am having [name] start on ciprofloxacin. I am also having her maintain her Botulinum Toxin Type A (Cosm), ibuprofen, ALREX, Hyaluronic Acid (RESTYLANE IJ), Diclofenac Sodium, FML S.O.P., fluticasone, tretinoin, hydrOXYzine, pantoprazole, fluocinonide cream, benzonatate, EPIPEN 2-PAK, hydrocortisone, conjugated estrogens, and triamcinolone cream.  Meds ordered this encounter  Medications  . ciprofloxacin (CIPRO) 250 MG tablet    Sig: Take 1 tablet (250 mg total) by mouth 2 (two) times daily.    Dispense:  10 tablet    Refill:  0     Follow-up: No Follow-up on file.  [name], MD

## 2015-02-23 NOTE — Assessment & Plan Note (Signed)
9/16 L pain UA CBC ESR

## 2015-02-23 NOTE — Progress Notes (Signed)
Pre visit review using our clinic review tool, if applicable. No additional management support is needed unless otherwise documented below in the visit note. 

## 2015-02-24 ENCOUNTER — Telehealth: Payer: Self-pay | Admitting: Internal Medicine

## 2015-02-24 NOTE — Telephone Encounter (Signed)
Patient is calling regarding her lab work. She was told to wait to take her medication till she gets the results back

## 2015-02-25 NOTE — Telephone Encounter (Signed)
Talked with patient, [name] at [phone], and she has read remarks on mychart and has no further questions

## 2015-02-25 NOTE — Telephone Encounter (Signed)
Labs are good - pls see MyChart Thx

## 2015-03-03 ENCOUNTER — Ambulatory Visit (INDEPENDENT_AMBULATORY_CARE_PROVIDER_SITE_OTHER): Payer: BLUE CROSS/BLUE SHIELD | Admitting: Family Medicine

## 2015-03-03 ENCOUNTER — Ambulatory Visit: Payer: BLUE CROSS/BLUE SHIELD | Admitting: Family Medicine

## 2015-03-03 ENCOUNTER — Encounter: Payer: Self-pay | Admitting: Family Medicine

## 2015-03-03 DIAGNOSIS — K219 Gastro-esophageal reflux disease without esophagitis: Secondary | ICD-10-CM

## 2015-03-03 NOTE — Assessment & Plan Note (Addendum)
Believe the patient is having more of a early ulcer type or possibly diverticulitis. Patient states though did not show any elevation in white blood cell count previously. Patient would like to hold on [name] biotics but will start taking an H2 and a histamine over-the-counter in addition to her Protonix. We discussed the possibility of other medications over-the-counter that could be beneficial. Patient will monitor to see if there is any other changes. Patient will call if any bright red blood per rectum or any nausea and vomiting with blood. Patient knows when to seek medical attention. Patient otherwise will follow-up with gastroenterology if this does not seem to resolve in 2 weeks. Spent  25 minutes with patient face-to-face and had greater than 50% of counseling including as described above in assessment and plan.

## 2015-03-03 NOTE — Patient Instructions (Signed)
Good to see you I think it is your stomach Add the pepcid 2 times daily for the next 2 weeks.  Watch and see if anything changes your stomach Colace 100mg  daily New exercises for the neck 3 times a week.  For pillow consider a normal foam or down that is firm See me again in 3 week.s  Standing:  Secure a rubber exercise band/tubing so that it is at the height of your shoulders when you are either standing or sitting on a firm arm-less chair.  Grasp an end of the band/tubing in each hand and have your palms face each other. Straighten your elbows and lift your hands straight in front of you at shoulder height. Step back away from the secured end of band/tubing until it becomes tense.  Squeeze your shoulder blades together. Keeping your elbows locked and your hands at shoulder-height, bring your hands out to your side.  Hold __________ seconds. Slowly ease the tension on the band/tubing as you reverse the directions and return to the starting position. Repeat __________ times. Complete this exercise __________ times per day. STRENGTH - Scapular Retractors  Secure a rubber exercise band/tubing so that it is at the height of your shoulders when you are either standing or sitting on a firm arm-less chair.  With a palm-down grip, grasp an end of the band/tubing in each hand. Straighten your elbows and lift your hands straight in front of you at shoulder height. Step back away from the secured end of band/tubing until it becomes tense.  Squeezing your shoulder blades together, draw your elbows back as you bend them. Keep your upper arm lifted away from your body throughout the exercise.  Hold __________ seconds. Slowly ease the tension on the band/tubing as you reverse the directions and return to the starting position. Repeat __________ times. Complete this exercise __________ times per day. STRENGTH - Shoulder Extensors   Secure a rubber exercise band/tubing so that it is at the height of  your shoulders when you are either standing or sitting on a firm arm-less chair.  With a thumbs-up grip, grasp an end of the band/tubing in each hand. Straighten your elbows and lift your hands straight in front of you at shoulder height. Step back away from the secured end of band/tubing until it becomes tense.  Squeezing your shoulder blades together, pull your hands down to the sides of your thighs. Do not allow your hands to go behind you.  Hold for __________ seconds. Slowly ease the tension on the band/tubing as you reverse the directions and return to the starting position. Repeat __________ times. Complete this exercise __________ times per day.  STRENGTH - Scapular Retractors and External Rotators  Secure a rubber exercise band/tubing so that it is at the height of your shoulders when you are either standing or sitting on a firm arm-less chair.  With a palm-down grip, grasp an end of the band/tubing in each hand. Bend your elbows 90 degrees and lift your elbows to shoulder height at your sides. Step back away from the secured end of band/tubing until it becomes tense.  Squeezing your shoulder blades together, rotate your shoulder so that your upper arm and elbow remain stationary, but your fists travel upward to head-height.  Hold __________ for seconds. Slowly ease the tension on the band/tubing as you reverse the directions and return to the starting position. Repeat __________ times. Complete this exercise __________ times per day.  STRENGTH - Scapular Retractors and External Rotators, Rowing  Secure a rubber exercise band/tubing so that it is at the height of your shoulders when you are either standing or sitting on a firm arm-less chair.  With a palm-down grip, grasp an end of the band/tubing in each hand. Straighten your elbows and lift your hands straight in front of you at shoulder height. Step back away from the secured end of band/tubing until it becomes tense.  Step 1:  Squeeze your shoulder blades together. Bending your elbows, draw your hands to your chest as if you are rowing a boat. At the end of this motion, your hands and elbow should be at shoulder-height and your elbows should be out to your sides.  Step 2: Rotate your shoulder to raise your hands above your head. Your forearms should be vertical and your upper-arms should be horizontal.  Hold for __________ seconds. Slowly ease the tension on the band/tubing as you reverse the directions and return to the starting position. Repeat __________ times. Complete this exercise __________ times per day.  STRENGTH - Scapular Retractors and Elevators  Secure a rubber exercise band/tubing so that it is at the height of your shoulders when you are either standing or sitting on a firm arm-less chair.  With a thumbs-up grip, grasp an end of the band/tubing in each hand. Step back away from the secured end of band/tubing until it becomes tense.  Squeezing your shoulder blades together, straighten your elbows and lift your hands straight over your head.  Hold for __________ seconds. Slowly ease the tension on the band/tubing as you reverse the directions and return to the starting position. Repeat __________ times. Complete this exercise __________ times per day.  Document Released: 06/04/2005 Document Revised: 08/27/2011 Document Reviewed: 09/16/2008 [facility] Patient Information 2015 [place], [place]. This information is not intended to replace advice given to you by your health care provider. Make sure you discuss any questions you have with your health care provider.

## 2015-03-03 NOTE — Progress Notes (Signed)
  [name] [address] [place], [place] [address] Phone: [phone] Subjective:    I'm seeing this patient by the request  of:  [name], MD   CC: Abdominal muscle pain  DXA:JOINOMVEHM [name] is a 64 y.o. female coming in with complaint of abdominal pain. Patient is concerned that it could be a muscle injury. States that it seems to be in the lower left quadrant. Hurts with certain activities but seems to even hurt when she is laying down. Patient denies any fevers, chills or any abnormal weight loss. By primary care physician and ruled out any type of urinary tract infection. Has noticed some mild changes in her stool and does take MiraLAX regularly. Patient states she is able to do most activities on her own. Some mild pain across the lower part of her back. States though that is not stopping her from any activity.     Past medical history, social, surgical and family history all reviewed in electronic medical record.   Review of Systems: No headache, visual changes, nausea, vomiting, diarrhea, constipation, dizziness, abdominal pain, skin rash, fevers, chills, night sweats, weight loss, swollen lymph nodes, body aches, joint swelling, muscle aches, chest pain, shortness of breath, mood changes.   Objective There were no vitals taken for this visit.  General: No apparent distress alert and oriented x3 mood and affect normal, dressed appropriately.  HEENT: Pupils equal, extraocular movements intact  Respiratory: Patient's speak in full sentences and does not appear short of breath  Cardiovascular: No lower extremity edema, non tender, no erythema  Skin: Warm dry intact with no signs of infection or rash on extremities or on axial skeleton.  Abdomen: Patient does have involuntary guarding of the left lower quadrant as well as the epigastric region. No rebound tenderness noted. No masses palpated. Neuro: Cranial nerves II through XII are  intact, neurovascularly intact in all extremities with 2+ DTRs and 2+ pulses.  Lymph: No lymphadenopathy of posterior or anterior cervical chain or axillae bilaterally.  Gait normal with good balance and coordination.  MSK:  Non tender with full range of motion and good stability and symmetric strength and tone of shoulders, elbows, wrist, hip, knee and ankles bilaterally.      Impression and Recommendations:     This case required medical decision making of moderate complexity.

## 2015-03-08 ENCOUNTER — Ambulatory Visit (INDEPENDENT_AMBULATORY_CARE_PROVIDER_SITE_OTHER): Payer: BLUE CROSS/BLUE SHIELD | Admitting: Internal Medicine

## 2015-03-08 ENCOUNTER — Encounter: Payer: Self-pay | Admitting: Internal Medicine

## 2015-03-08 VITALS — BP 122/78 | HR 76 | Temp 98.7°F | Wt 103.0 lb

## 2015-03-08 DIAGNOSIS — M545 Low back pain, unspecified: Secondary | ICD-10-CM | POA: Insufficient documentation

## 2015-03-08 DIAGNOSIS — K219 Gastro-esophageal reflux disease without esophagitis: Secondary | ICD-10-CM

## 2015-03-08 DIAGNOSIS — F411 Generalized anxiety disorder: Secondary | ICD-10-CM | POA: Insufficient documentation

## 2015-03-08 DIAGNOSIS — K589 Irritable bowel syndrome without diarrhea: Secondary | ICD-10-CM

## 2015-03-08 DIAGNOSIS — A059 Bacterial foodborne intoxication, unspecified: Secondary | ICD-10-CM | POA: Diagnosis not present

## 2015-03-08 MED ORDER — RANITIDINE HCL 150 MG PO TABS: 150.0000 mg | ORAL_TABLET | Freq: Two times a day (BID) | ORAL | Status: DC

## 2015-03-08 NOTE — Assessment & Plan Note (Signed)
Mild Try Valerian root prn

## 2015-03-08 NOTE — Assessment & Plan Note (Signed)
Gluten free trial (no wheat products) for 4-6 weeks. OK to use gluten-free bread and gluten-free pasta.  Try Valerian root  Try activated charcoal tablets for gas  Read about Linzess for IBS-C

## 2015-03-08 NOTE — Assessment & Plan Note (Signed)
9/16 d/c PPI, start H2 blocker - Zantac

## 2015-03-08 NOTE — Assessment & Plan Note (Signed)
2016 L MSK

## 2015-03-08 NOTE — Patient Instructions (Addendum)
Gluten free trial (no wheat products) for 4-6 weeks. OK to use gluten-free bread and gluten-free pasta.  Try Valerian root  Try activated charcoal tablets for gas  Read about Linzess for IBS-C

## 2015-03-08 NOTE — Assessment & Plan Note (Signed)
<  24 h sx's resolved

## 2015-03-08 NOTE — Progress Notes (Signed)
Pre visit review using our clinic review tool, if applicable. No additional management support is needed unless otherwise documented below in the visit note. 

## 2015-03-08 NOTE — Progress Notes (Signed)
Subjective:  Patient ID: [name], female    DOB: 20-May-1951  Age: 64 y.o. MRN: [id number]  CC: No chief complaint on file.   HPI   [name] presents for HA, diarrhea, vomiting on Sat last week <24 hrs - resolved. C/o LBP, L flank pain.  Outpatient Prescriptions Prior to Visit  Medication Sig Dispense Refill  . ALREX 0.2 % SUSP Apply 0.2 drops to eye as needed.     . Botulinum Toxin Type A, Cosm, (BOTOX COSMETIC) 50 UNITS SOLR Inject 4-6 Units into the muscle. 3 month  injection    . conjugated estrogens (PREMARIN) vaginal cream INSERT 1 GRAM VAGINALLY ONCE A WEEK AS DIRECTED 30 g 3  . Diclofenac Sodium 2 % SOLN Apply twice daily. 112 g 1  . EPIPEN 2-PAK 0.3 MG/0.3ML SOAJ injection See admin instructions.  1  . fluocinonide cream (LIDEX) 7.93 % Apply 1 application topically 2 (two) times daily. 90 g 2  . fluticasone (FLONASE) 50 MCG/ACT nasal spray Place 1 spray into both nostrils as needed.   5  . FML S.O.P. 0.1 % ophthalmic ointment Place 1 application into the left eye 3 (three) times daily.     [name] Hyaluronic Acid (RESTYLANE IJ) Inject as directed every 6 (six) months.    . hydrocortisone (ANUSOL-HC) 25 MG suppository Place 1 suppository (25 mg total) rectally 2 (two) times daily. 14 suppository 2  . hydrOXYzine (ATARAX/VISTARIL) 10 MG tablet Take 1 tablet (10 mg total) by mouth 3 (three) times daily as needed. 30 tablet 0  . ibuprofen (ADVIL,MOTRIN) 200 MG tablet Take 200 mg by mouth as needed.      . tretinoin (RETIN-A) 0.05 % cream Apply 1 application topically at bedtime.    . triamcinolone cream (KENALOG) 0.1 % Apply 1 application topically 2 (two) times daily. 30 g 0  . benzonatate (TESSALON) 100 MG capsule Take 1-2 capsules (100-200 mg total) by mouth 2 (two) times daily as needed for cough. 60 capsule 0  . ciprofloxacin (CIPRO) 250 MG tablet Take 1 tablet (250 mg total) by mouth 2 (two) times daily. 10 tablet 0  . pantoprazole (PROTONIX) 40 MG tablet Take 1  tablet (40 mg total) by mouth daily. 30 tablet 11   No facility-administered medications prior to visit.    ROS Review of Systems  Constitutional: Negative for chills, activity change, appetite change, fatigue and unexpected weight change.  HENT: Negative for congestion, mouth sores and sinus pressure.   Eyes: Negative for visual disturbance.  Respiratory: Negative for cough and chest tightness.   Gastrointestinal: Positive for abdominal pain. Negative for nausea and diarrhea.  Genitourinary: Negative for frequency, difficulty urinating and vaginal pain.  Musculoskeletal: Positive for back pain. Negative for gait problem.  Skin: Negative for pallor and rash.  Neurological: Positive for headaches. Negative for dizziness, tremors, weakness and numbness.  Psychiatric/Behavioral: Negative for confusion and sleep disturbance.    Objective:  BP 122/78 mmHg  Pulse 76  Temp(Src) 98.7 F (37.1 C) (Oral)  Wt 103 lb (46.72 kg)  SpO2 97%  BP Readings from Last 3 Encounters:  03/08/15 122/78  02/23/15 94/68  02/03/15 108/64    Wt Readings from Last 3 Encounters:  03/08/15 103 lb (46.72 kg)  02/23/15 105 lb (47.628 kg)  02/03/15 105 lb (47.628 kg)    Physical Exam  Constitutional: She appears well-developed. No distress.  HENT:  Head: Normocephalic.  Right Ear: External ear normal.  Left Ear: External ear normal.  Nose: Nose  normal.  Mouth/Throat: Oropharynx is clear and moist.  Eyes: Conjunctivae are normal. Pupils are equal, round, and reactive to light. Right eye exhibits no discharge. Left eye exhibits no discharge.  Neck: Normal [name] of motion. Neck supple. No JVD present. No tracheal deviation present. No thyromegaly present.  Cardiovascular: Normal rate, regular rhythm and normal heart sounds.   Pulmonary/Chest: No stridor. No respiratory distress. She has no wheezes.  Abdominal: Soft. Bowel sounds are normal. She exhibits no distension and no mass. There is no  tenderness. There is no rebound and no guarding.  Musculoskeletal: She exhibits no edema or tenderness.  Lymphadenopathy:    She has no cervical adenopathy.  Neurological: She displays normal reflexes. No cranial nerve deficit. She exhibits normal muscle tone. Coordination normal.  Skin: No rash noted. No erythema.  Psychiatric: She has a normal mood and affect. Her behavior is normal. Judgment and thought content normal.    Lab Results  Component Value Date   WBC 3.7* 02/23/2015   HGB 13.4 02/23/2015   HCT 39.8 02/23/2015   PLT 183.0 02/23/2015   GLUCOSE 91 09/24/2014   CHOL 194 09/24/2014   TRIG 73.0 09/24/2014   HDL 67.60 09/24/2014   LDLCALC 112* 09/24/2014   ALT 15 09/24/2014   AST 14 09/24/2014   NA 140 09/24/2014   K 4.2 09/28/2014   CL 107 09/24/2014   CREATININE 0.84 09/24/2014   BUN 22 09/24/2014   CO2 30 09/24/2014   TSH 1.78 09/24/2014    No results found.  Assessment & Plan:   Diagnoses and all orders for this visit:  Gastroesophageal reflux disease without esophagitis  Irritable bowel syndrome  Other orders -     ranitidine (ZANTAC) 150 MG tablet; Take 1 tablet (150 mg total) by mouth 2 (two) times daily.  I have discontinued [name]'s pantoprazole, benzonatate, and ciprofloxacin. I am also having her start on ranitidine. Additionally, I am having her maintain her Botulinum Toxin Type A (Cosm), ibuprofen, ALREX, Hyaluronic Acid (RESTYLANE IJ), Diclofenac Sodium, FML S.O.P., fluticasone, tretinoin, hydrOXYzine, fluocinonide cream, EPIPEN 2-PAK, hydrocortisone, conjugated estrogens, and triamcinolone cream.  Meds ordered this encounter  Medications  . ranitidine (ZANTAC) 150 MG tablet    Sig: Take 1 tablet (150 mg total) by mouth 2 (two) times daily.    Dispense:  180 tablet    Refill:  3     Follow-up: Return in about 3 months (around 06/07/2015) for a follow-up visit.  [name], MD

## 2015-03-11 ENCOUNTER — Other Ambulatory Visit: Payer: Self-pay | Admitting: *Deleted

## 2015-03-11 MED ORDER — LIDOCAINE VISCOUS 2 % MT SOLN: 5.0000 mL | OROMUCOSAL | Status: DC | PRN

## 2015-03-11 NOTE — Telephone Encounter (Signed)
Pt walked in office requesting refill on Lidocaine 2% solution. Gargle and spit 1 tsp with water. Last written in 2005 by [name]. Ok to Rf?

## 2015-03-11 NOTE — Telephone Encounter (Signed)
Ok thx.

## 2015-03-11 NOTE — Telephone Encounter (Signed)
New Rx sent. See meds.  

## 2015-03-12 ENCOUNTER — Encounter: Payer: Self-pay | Admitting: Internal Medicine

## 2015-03-14 LAB — HM MAMMOGRAPHY

## 2015-03-15 ENCOUNTER — Ambulatory Visit (INDEPENDENT_AMBULATORY_CARE_PROVIDER_SITE_OTHER): Payer: BLUE CROSS/BLUE SHIELD | Admitting: Internal Medicine

## 2015-03-15 ENCOUNTER — Encounter: Payer: Self-pay | Admitting: Internal Medicine

## 2015-03-15 VITALS — BP 130/78 | HR 81 | Wt 102.0 lb

## 2015-03-15 DIAGNOSIS — H3581 Retinal edema: Secondary | ICD-10-CM | POA: Diagnosis not present

## 2015-03-15 DIAGNOSIS — F411 Generalized anxiety disorder: Secondary | ICD-10-CM

## 2015-03-15 MED ORDER — ASPIRIN EC 81 MG PO TBEC: 81.0000 mg | DELAYED_RELEASE_TABLET | Freq: Every day | ORAL | Status: DC

## 2015-03-15 NOTE — Progress Notes (Signed)
Pre visit review using our clinic review tool, if applicable. No additional management support is needed unless otherwise documented below in the visit note. 

## 2015-03-15 NOTE — Progress Notes (Signed)
Subjective:  Patient ID: [name], female    DOB: 1950-11-16  Age: 64 y.o. MRN: [id number]  CC: No chief complaint on file.   HPI [name] presents for a cotton wool spot OS and possible retinal ischemia, "cotton wool spots" diagnosed by Dr [name]  Sx's started on 9/16.  Outpatient Prescriptions Prior to Visit  Medication Sig Dispense Refill  . ALREX 0.2 % SUSP Apply 0.2 drops to eye as needed.     . Botulinum Toxin Type A, Cosm, (BOTOX COSMETIC) 50 UNITS SOLR Inject 4-6 Units into the muscle. 3 month  injection    . conjugated estrogens (PREMARIN) vaginal cream INSERT 1 GRAM VAGINALLY ONCE A WEEK AS DIRECTED 30 g 3  . Diclofenac Sodium 2 % SOLN Apply twice daily. 112 g 1  . EPIPEN 2-PAK 0.3 MG/0.3ML SOAJ injection See admin instructions.  1  . fluocinonide cream (LIDEX) 6.78 % Apply 1 application topically 2 (two) times daily. 90 g 2  . fluticasone (FLONASE) 50 MCG/ACT nasal spray Place 1 spray into both nostrils as needed.   5  . FML S.O.P. 0.1 % ophthalmic ointment Place 1 application into the left eye 3 (three) times daily.     [name] Hyaluronic Acid (RESTYLANE IJ) Inject as directed every 6 (six) months.    . hydrocortisone (ANUSOL-HC) 25 MG suppository Place 1 suppository (25 mg total) rectally 2 (two) times daily. 14 suppository 2  . hydrOXYzine (ATARAX/VISTARIL) 10 MG tablet Take 1 tablet (10 mg total) by mouth 3 (three) times daily as needed. 30 tablet 0  . ibuprofen (ADVIL,MOTRIN) 200 MG tablet Take 200 mg by mouth as needed.      . lidocaine (XYLOCAINE) 2 % solution Use as directed 5 mLs in the mouth or throat as needed for mouth pain. 100 mL 0  . ranitidine (ZANTAC) 150 MG tablet Take 1 tablet (150 mg total) by mouth 2 (two) times daily. 180 tablet 3  . tretinoin (RETIN-A) 0.05 % cream Apply 1 application topically at bedtime.    . triamcinolone cream (KENALOG) 0.1 % Apply 1 application topically 2 (two) times daily. 30 g 0   No facility-administered  medications prior to visit.    ROS Review of Systems  Constitutional: Negative for chills, activity change, appetite change, fatigue and unexpected weight change.  HENT: Negative for congestion, mouth sores and sinus pressure.   Eyes: Positive for visual disturbance. Negative for pain.  Respiratory: Negative for cough and chest tightness.   Gastrointestinal: Negative for nausea and abdominal pain.  Genitourinary: Negative for frequency, difficulty urinating and vaginal pain.  Musculoskeletal: Negative for back pain and gait problem.  Skin: Negative for pallor and rash.  Neurological: Negative for dizziness, tremors, weakness, numbness and headaches.  Psychiatric/Behavioral: Negative for confusion and sleep disturbance.    Objective:  BP 130/78 mmHg  Pulse 81  Wt 102 lb (46.267 kg)  SpO2 96%  BP Readings from Last 3 Encounters:  03/15/15 130/78  03/08/15 122/78  02/23/15 94/68    Wt Readings from Last 3 Encounters:  03/15/15 102 lb (46.267 kg)  03/08/15 103 lb (46.72 kg)  02/23/15 105 lb (47.628 kg)    Physical Exam  Lab Results  Component Value Date   WBC 3.7* 02/23/2015   HGB 13.4 02/23/2015   HCT 39.8 02/23/2015   PLT 183.0 02/23/2015   GLUCOSE 91 09/24/2014   CHOL 194 09/24/2014   TRIG 73.0 09/24/2014   HDL 67.60 09/24/2014   LDLCALC 112* 09/24/2014  ALT 15 09/24/2014   AST 14 09/24/2014   NA 140 09/24/2014   K 4.2 09/28/2014   CL 107 09/24/2014   CREATININE 0.84 09/24/2014   BUN 22 09/24/2014   CO2 30 09/24/2014   TSH 1.78 09/24/2014    No results found.  Assessment & Plan:   Diagnoses and all orders for this visit:  Cotton wool spots -     US Carotid Bilateral -     Basic metabolic panel; Future -     Lipid panel; Future -     Protein electrophoresis, serum; Future  Other orders -     aspirin EC 81 MG tablet; Take 1 tablet (81 mg total) by mouth daily.  I am having Ms. Vanpelt start on aspirin EC. I am also having her maintain her  Botulinum Toxin Type A (Cosm), ibuprofen, ALREX, Hyaluronic Acid (RESTYLANE IJ), Diclofenac Sodium, FML S.O.P., fluticasone, tretinoin, hydrOXYzine, fluocinonide cream, EPIPEN 2-PAK, hydrocortisone, conjugated estrogens, triamcinolone cream, ranitidine, and lidocaine.  Meds ordered this encounter  Medications  . aspirin EC 81 MG tablet    Sig: Take 1 tablet (81 mg total) by mouth daily.    Dispense:  100 tablet    Refill:  3     Follow-up: Return for a follow-up visit.  [name], MD

## 2015-03-15 NOTE — Assessment & Plan Note (Signed)
Doing well 

## 2015-03-15 NOTE — Assessment & Plan Note (Addendum)
9/16 L eye Dr Clent Jacks  F/u w/Dr [name] 10/25 Carot doppler US Labs: SPEP, etc Hold off Rystalane injections before Ophth f/up - r/o gel embolism

## 2015-03-16 ENCOUNTER — Other Ambulatory Visit (INDEPENDENT_AMBULATORY_CARE_PROVIDER_SITE_OTHER): Payer: BLUE CROSS/BLUE SHIELD

## 2015-03-16 DIAGNOSIS — H3581 Retinal edema: Secondary | ICD-10-CM | POA: Diagnosis not present

## 2015-03-16 LAB — LIPID PANEL
CHOLESTEROL: 173 mg/dL (ref 0–200)
HDL: 68.1 mg/dL (ref >39.00)
LDL CALC: 91 mg/dL (ref 0–99)
NonHDL: 104.52
TRIGLYCERIDES: 66 mg/dL (ref 0.0–149.0)
Total CHOL/HDL Ratio: 3
VLDL: 13.2 mg/dL (ref 0.0–40.0)

## 2015-03-16 LAB — BASIC METABOLIC PANEL
BUN: 16 mg/dL (ref 6–23)
CO2: 31 meq/L (ref 19–32)
Calcium: 9.8 mg/dL (ref 8.4–10.5)
Chloride: 107 mEq/L (ref 96–112)
Creatinine, Ser: 0.82 mg/dL (ref 0.40–1.20)
GFR: 74.98 mL/min (ref >60.00)
GLUCOSE: 95 mg/dL (ref 70–99)
Potassium: 4.2 mEq/L (ref 3.5–5.1)
SODIUM: 143 meq/L (ref 135–145)

## 2015-03-18 ENCOUNTER — Ambulatory Visit (HOSPITAL_COMMUNITY)
Admission: RE | Admit: 2015-03-18 | Discharge: 2015-03-18 | Disposition: A | Discharge status: Home / Self Care | Payer: BLUE CROSS/BLUE SHIELD | Source: Ambulatory Visit | Attending: Cardiology | Admitting: Cardiology

## 2015-03-18 DIAGNOSIS — I6523 Occlusion and stenosis of bilateral carotid arteries: Secondary | ICD-10-CM | POA: Insufficient documentation

## 2015-03-18 DIAGNOSIS — H3581 Retinal edema: Secondary | ICD-10-CM | POA: Diagnosis not present

## 2015-03-18 LAB — PROTEIN ELECTROPHORESIS, SERUM
ALBUMIN ELP: 4.1 g/dL (ref 3.8–4.8)
ALPHA-2-GLOBULIN: 0.7 g/dL (ref 0.5–0.9)
Alpha-1-Globulin: 0.3 g/dL (ref 0.2–0.3)
BETA 2: 0.4 g/dL (ref 0.2–0.5)
BETA GLOBULIN: 0.4 g/dL (ref 0.4–0.6)
Gamma Globulin: 1 g/dL (ref 0.8–1.7)
Total Protein, Serum Electrophoresis: 6.9 g/dL (ref 6.1–8.1)

## 2015-03-21 ENCOUNTER — Telehealth: Payer: Self-pay | Admitting: Cardiovascular Disease

## 2015-03-21 NOTE — Telephone Encounter (Signed)
New Message     Pt calling stating that her PCP Dr. [name] ordered a Carotid for pt and was told that she has some build up and pt would like for Dr. Angelena Form to look at the report and let her know what he thinks. Please call back and advise.

## 2015-03-21 NOTE — Telephone Encounter (Signed)
I spoke with pt and told her I would send message to Dr. Angelena Form asking him to review carotid dopplers .

## 2015-03-22 ENCOUNTER — Ambulatory Visit: Payer: BLUE CROSS/BLUE SHIELD

## 2015-03-22 NOTE — Telephone Encounter (Signed)
I have reviewed her carotid study. There is no plaque seen in u/s but based on slightly elevated velocities, it is estimated she could have very minor plaque in the carotids. This would appear to be very minor if any plaque is there. I would reassure her that this is nothing to worry about. Repeat carotids in 2 years to reassess.   Gerald Stabs

## 2015-03-22 NOTE — Telephone Encounter (Signed)
Called patient with Dr. Camillia Herter note. Patient verbalized understanding.

## 2015-03-23 ENCOUNTER — Telehealth: Payer: Self-pay | Admitting: Internal Medicine

## 2015-03-23 NOTE — Telephone Encounter (Signed)
Received medical records from [facility] sent to Dr. [name]. 03/23/15/ss

## 2015-03-24 ENCOUNTER — Ambulatory Visit: Payer: BLUE CROSS/BLUE SHIELD

## 2015-03-25 ENCOUNTER — Encounter (HOSPITAL_COMMUNITY): Payer: BLUE CROSS/BLUE SHIELD

## 2015-04-05 ENCOUNTER — Telehealth: Payer: Self-pay | Admitting: Internal Medicine

## 2015-04-05 NOTE — Telephone Encounter (Signed)
Best # now is [phone]

## 2015-04-06 NOTE — Telephone Encounter (Signed)
Lm on vm 

## 2015-04-07 NOTE — Telephone Encounter (Signed)
Spoke with patient who was expressing concerns about taking a PPI for too long.  She stated that Dr. [name] had told her she could take Ranitidine, which she has done with success.  Patient wondered if it was necessary to taper off a PPI to change to something else.  I told her she would be fine to stop if the Zantac was working.  Patient agreed

## 2015-04-08 ENCOUNTER — Ambulatory Visit (INDEPENDENT_AMBULATORY_CARE_PROVIDER_SITE_OTHER): Payer: BLUE CROSS/BLUE SHIELD

## 2015-04-08 DIAGNOSIS — Z23 Encounter for immunization: Secondary | ICD-10-CM | POA: Diagnosis not present

## 2015-04-18 ENCOUNTER — Emergency Department (HOSPITAL_COMMUNITY)
Admission: EM | Admit: 2015-04-18 | Discharge: 2015-04-18 | Disposition: A | Discharge status: Home / Self Care | Payer: BLUE CROSS/BLUE SHIELD | Attending: Emergency Medicine | Admitting: Emergency Medicine

## 2015-04-18 ENCOUNTER — Emergency Department (HOSPITAL_COMMUNITY): Payer: BLUE CROSS/BLUE SHIELD

## 2015-04-18 ENCOUNTER — Ambulatory Visit: Payer: BLUE CROSS/BLUE SHIELD

## 2015-04-18 DIAGNOSIS — R195 Other fecal abnormalities: Secondary | ICD-10-CM | POA: Insufficient documentation

## 2015-04-18 DIAGNOSIS — K625 Hemorrhage of anus and rectum: Secondary | ICD-10-CM | POA: Insufficient documentation

## 2015-04-18 DIAGNOSIS — Z880 Allergy status to penicillin: Secondary | ICD-10-CM | POA: Insufficient documentation

## 2015-04-18 DIAGNOSIS — Z8739 Personal history of other diseases of the musculoskeletal system and connective tissue: Secondary | ICD-10-CM | POA: Diagnosis not present

## 2015-04-18 DIAGNOSIS — Z8679 Personal history of other diseases of the circulatory system: Secondary | ICD-10-CM | POA: Diagnosis not present

## 2015-04-18 DIAGNOSIS — Z791 Long term (current) use of non-steroidal anti-inflammatories (NSAID): Secondary | ICD-10-CM | POA: Diagnosis not present

## 2015-04-18 DIAGNOSIS — Z8659 Personal history of other mental and behavioral disorders: Secondary | ICD-10-CM | POA: Insufficient documentation

## 2015-04-18 DIAGNOSIS — Z7982 Long term (current) use of aspirin: Secondary | ICD-10-CM | POA: Insufficient documentation

## 2015-04-18 DIAGNOSIS — Z79899 Other long term (current) drug therapy: Secondary | ICD-10-CM | POA: Diagnosis not present

## 2015-04-18 DIAGNOSIS — Z8709 Personal history of other diseases of the respiratory system: Secondary | ICD-10-CM | POA: Insufficient documentation

## 2015-04-18 DIAGNOSIS — K219 Gastro-esophageal reflux disease without esophagitis: Secondary | ICD-10-CM | POA: Diagnosis not present

## 2015-04-18 DIAGNOSIS — Z7952 Long term (current) use of systemic steroids: Secondary | ICD-10-CM | POA: Insufficient documentation

## 2015-04-18 DIAGNOSIS — Z87442 Personal history of urinary calculi: Secondary | ICD-10-CM | POA: Insufficient documentation

## 2015-04-18 LAB — TYPE AND SCREEN
ABO/RH(D): A NEG
ANTIBODY SCREEN: NEGATIVE

## 2015-04-18 LAB — COMPREHENSIVE METABOLIC PANEL
ALBUMIN: 4.5 g/dL (ref 3.5–5.0)
ALK PHOS: 70 U/L (ref 38–126)
ALT: 18 U/L (ref 14–54)
AST: 20 U/L (ref 15–41)
Anion gap: 7 (ref 5–15)
BUN: 22 mg/dL — ABNORMAL HIGH (ref 6–20)
CALCIUM: 10 mg/dL (ref 8.9–10.3)
CHLORIDE: 104 mmol/L (ref 101–111)
CO2: 27 mmol/L (ref 22–32)
Creatinine, Ser: 0.79 mg/dL (ref 0.44–1.00)
GFR calc non Af Amer: >60 mL/min (ref >60)
Glucose, Bld: 102 mg/dL — ABNORMAL HIGH (ref 65–99)
Potassium: 4.8 mmol/L (ref 3.5–5.1)
SODIUM: 138 mmol/L (ref 135–145)
TOTAL PROTEIN: 7.6 g/dL (ref 6.5–8.1)
Total Bilirubin: 0.9 mg/dL (ref 0.3–1.2)

## 2015-04-18 LAB — URINALYSIS, ROUTINE W REFLEX MICROSCOPIC
BILIRUBIN URINE: NEGATIVE
Glucose, UA: NEGATIVE mg/dL
Ketones, ur: NEGATIVE mg/dL
Leukocytes, UA: NEGATIVE
NITRITE: NEGATIVE
PROTEIN: NEGATIVE mg/dL
SPECIFIC GRAVITY, URINE: 1.018 (ref 1.005–1.030)
UROBILINOGEN UA: 0.2 mg/dL (ref 0.0–1.0)
pH: 5 (ref 5.0–8.0)

## 2015-04-18 LAB — CBC
HCT: 38.2 % (ref 36.0–46.0)
HEMOGLOBIN: 12.8 g/dL (ref 12.0–15.0)
MCH: 30.4 pg (ref 26.0–34.0)
MCHC: 33.5 g/dL (ref 30.0–36.0)
MCV: 90.7 fL (ref 78.0–100.0)
PLATELETS: 202 10*3/uL (ref 150–400)
RBC: 4.21 MIL/uL (ref 3.87–5.11)
RDW: 12.4 % (ref 11.5–15.5)
WBC: 8.2 10*3/uL (ref 4.0–10.5)

## 2015-04-18 LAB — URINE MICROSCOPIC-ADD ON

## 2015-04-18 LAB — ABO/RH: ABO/RH(D): A NEG

## 2015-04-18 LAB — POC OCCULT BLOOD, ED: FECAL OCCULT BLD: POSITIVE — AB

## 2015-04-18 MED ORDER — IOHEXOL 300 MG/ML  SOLN
100.0000 mL | Freq: Once | INTRAMUSCULAR | Status: AC | PRN
  Administered 2015-04-18: 100 mL via INTRAVENOUS

## 2015-04-18 NOTE — Discharge Instructions (Signed)
Follow up with your stomach md this week.  Return as needed

## 2015-04-18 NOTE — ED Notes (Signed)
Pt reports hx of hemorrhoids, Dr Henrene Pastor is GI doctor. Pt hx of hemorrhoids bursting and bleeding. Pt reports today at 1100 she felt something wet and she noticed blood on her chair at work. Pts underwear is soaked with blood. rn checked pts rectum, no active bleeding noted, blood looks as if it is coming from rectum. Pt has hx of kidney stones. Started having left side and abdomen pain x2 days. Pain 4/10. Denies dysuria. Last bowel movement this morning, slightly loose, no blood noted at that time.

## 2015-04-18 NOTE — ED Provider Notes (Signed)
CSN: [id number]     Arrival date & time 04/18/15  1137 History   First MD Initiated Contact with Patient 04/18/15 1509     Chief Complaint  Patient presents with  . Rectal Bleeding     (Consider location/radiation/quality/duration/timing/severity/associated sxs/prior Treatment) Patient is a 64 y.o. female presenting with hematochezia. The history is provided by the patient (Patient states that she started having a lot of bright red blood per rectum today. This has happened before when she's had hemorrhoids. Patient states the bleeding has stopped now).  Rectal Bleeding Quality:  Bright red Amount:  Copious Timing:  Intermittent Progression:  Resolved Chronicity:  New Context: not anal fissures   Similar prior episodes: yes   Relieved by:  Nothing Worsened by:  Nothing tried Associated symptoms: no abdominal pain     Past Medical History  Diagnosis Date  . Mild aortic insufficiency   . Mild tricuspid regurgitation   . Internal hemorrhoids without mention of complication   . Unspecified constipation   . Irritable bowel syndrome   . Hemorrhage of rectum and anus   . Microscopic hematuria   . Personal history of urinary calculi   . Other plastic surgery for unacceptable cosmetic appearance     Inj tx fllers/expander  . Esophageal reflux   . Postmenopausal atrophic vaginitis   . Backache, unspecified   . Allergic rhinitis, cause unspecified   . Conversion disorder   . Calculus of kidney   . Migraine headache with aura   . Benign fasciculation-cramp syndrome 12/03/2012     Worsening with  stress, fatigue .  [name] Osteopenia   . Premature atrial contractions   . Vasovagal syncope   . GERD (gastroesophageal reflux disease)   . PVC (premature ventricular contraction)     a. Event monitor 2013.   Past Surgical History  Procedure Laterality Date  . Cosmetic procedures w/injection therapy    . Wisdom tooth extraction    . Breast biopsy Right    Family History  Problem  Relation Age of Onset  . Hyperlipidemia Father   . COPD Father     Smoker, deceased 2014/06/01  . Hyperlipidemia Mother   . Diabetes Neg Hx   . Colon cancer Neg Hx   . Breast cancer Neg Hx   . Coronary artery disease Neg Hx   . Cancer Neg Hx     breast or colon  . Hypertension Father   . Hypertension Mother    Social History  Substance Use Topics  . Smoking status: Never Smoker   . Smokeless tobacco: Never Used  . Alcohol Use: No   OB History    No data available     Review of Systems  Constitutional: Negative for appetite change and fatigue.  HENT: Negative for congestion, ear discharge and sinus pressure.   Eyes: Negative for discharge.  Respiratory: Negative for cough.   Cardiovascular: Negative for chest pain.  Gastrointestinal: Positive for hematochezia. Negative for abdominal pain and diarrhea.       Rectal bleeding  Genitourinary: Negative for frequency and hematuria.  Musculoskeletal: Negative for back pain.  Skin: Negative for rash.  Neurological: Negative for seizures and headaches.  Psychiatric/Behavioral: Negative for hallucinations.      Allergies  Codeine; Epinephrine; Neomycin; Penicillins; Sulfonamide derivatives; and Zithromax  Home Medications   Prior to Admission medications   Medication Sig Start Date End Date Taking? Authorizing Provider  ALREX 0.2 % SUSP Apply 0.2 drops to eye as needed (allergies).  10/19/12  Yes Historical Provider, MD  Botulinum Toxin Type A, Cosm, (BOTOX COSMETIC) 50 UNITS SOLR Inject 4-6 Units into the muscle. 3 month  injection   Yes Historical Provider, MD  conjugated estrogens (PREMARIN) vaginal cream INSERT 1 GRAM VAGINALLY ONCE A WEEK AS DIRECTED 09/21/14  Yes Aleksei Plotnikov V, MD  Cyanocobalamin (VITAMIN B-12 PO) Take 1 tablet by mouth daily.   Yes Historical Provider, MD  EPIPEN 2-PAK 0.3 MG/0.3ML SOAJ injection See admin instructions. 06/30/14  Yes Historical Provider, MD  fexofenadine (ALLEGRA ALLERGY CHILDRENS) 30  MG tablet Take 30 mg by mouth 2 (two) times daily as needed (allergies).   Yes Historical Provider, MD  fluocinonide cream (LIDEX) 3.33 % Apply 1 application topically 2 (two) times daily. 08/26/14  Yes Aleksei Plotnikov V, MD  fluticasone (FLONASE) 50 MCG/ACT nasal spray Place 1 spray into both nostrils as needed for allergies.  03/26/14  Yes Historical Provider, MD  Hyaluronic Acid (RESTYLANE IJ) Inject as directed every 6 (six) months.   Yes Historical Provider, MD  ibuprofen (ADVIL,MOTRIN) 200 MG tablet Take 400-600 mg by mouth as needed for headache or moderate pain.    Yes Historical Provider, MD  lidocaine (XYLOCAINE) 2 % solution Use as directed 5 mLs in the mouth or throat as needed for mouth pain. 03/11/15  Yes Aleksei Plotnikov V, MD  Multiple Vitamins-Minerals (ICAPS AREDS 2) CAPS Take 1 capsule by mouth daily.   Yes Historical Provider, MD  ranitidine (ZANTAC) 150 MG tablet Take 1 tablet (150 mg total) by mouth 2 (two) times daily. 03/08/15  Yes Aleksei Plotnikov V, MD  tretinoin (RETIN-A) 0.05 % cream Apply 1 application topically at bedtime.   Yes Historical Provider, MD  triamcinolone cream (KENALOG) 0.1 % Apply 1 application topically 2 (two) times daily. 02/03/15  Yes Biagio Borg, MD  aspirin EC 81 MG tablet Take 1 tablet (81 mg total) by mouth daily. 03/15/15   Aleksei Plotnikov V, MD  Diclofenac Sodium 2 % SOLN Apply twice daily. 02/16/14   Lyndal Pulley, DO  hydrocortisone (ANUSOL-HC) 25 MG suppository Place 1 suppository (25 mg total) rectally 2 (two) times daily. 09/07/14   Laban Emperor Zehr, PA-C  hydrOXYzine (ATARAX/VISTARIL) 10 MG tablet Take 1 tablet (10 mg total) by mouth 3 (three) times daily as needed. Patient taking differently: Take 10 mg by mouth 3 (three) times daily as needed for itching.  08/19/14   Hendricks Limes, MD  pantoprazole (PROTONIX) 40 MG tablet Take 40 mg by mouth daily. 03/14/15   Historical Provider, MD   BP 142/77 mmHg  Pulse 83  Temp(Src) 98.8 F (37.1 C)  (Oral)  Resp 17  SpO2 100% Physical Exam  Constitutional: She is oriented to person, place, and time. She appears well-developed.  HENT:  Head: Normocephalic.  Eyes: Conjunctivae and EOM are normal. No scleral icterus.  Neck: Neck supple. No thyromegaly present.  Cardiovascular: Normal rate and regular rhythm.  Exam reveals no gallop and no friction rub.   No murmur heard. Pulmonary/Chest: No stridor. She has no wheezes. She has no rales. She exhibits no tenderness.  Abdominal: She exhibits no distension. There is no tenderness. There is no rebound.  Genitourinary:  Rectal exam normal exam brown stool heme-positive.   Pelvic exam done,  No blood in vault  Musculoskeletal: Normal range of motion. She exhibits no edema.  Lymphadenopathy:    She has no cervical adenopathy.  Neurological: She is oriented to person, place, and time. She exhibits normal muscle tone.  Coordination normal.  Skin: No rash noted. No erythema.  Psychiatric: She has a normal mood and affect. Her behavior is normal.    ED Course  Procedures (including critical care time) Labs Review Labs Reviewed  COMPREHENSIVE METABOLIC PANEL - Abnormal; Notable for the following:    Glucose, Bld 102 (*)    BUN 22 (*)    All other components within normal limits  URINALYSIS, ROUTINE W REFLEX MICROSCOPIC (NOT AT [facility]) - Abnormal; Notable for the following:    Hgb urine dipstick LARGE (*)    All other components within normal limits  POC OCCULT BLOOD, ED - Abnormal; Notable for the following:    Fecal Occult Bld POSITIVE (*)    All other components within normal limits  CBC  URINE MICROSCOPIC-ADD ON  POC OCCULT BLOOD, ED  TYPE AND SCREEN  ABO/RH    Imaging Review Ct Abdomen Pelvis W Contrast  04/18/2015  CLINICAL DATA:  Recent hemorrhoidal bleeding EXAM: CT ABDOMEN AND PELVIS WITH CONTRAST TECHNIQUE: Multidetector CT imaging of the abdomen and pelvis was performed using the standard protocol following bolus  administration of intravenous contrast. CONTRAST:  180mL OMNIPAQUE IOHEXOL 300 MG/ML  SOLN COMPARISON:  03/08/2008 FINDINGS: The lung bases are free of acute infiltrate or sizable effusion. The liver, gallbladder, spleen, right adrenal gland and pancreas are within normal limits. The left adrenal gland demonstrates a 2.2 cm predominately hypodense lesion. It is stable in appearance from a prior exam and likely represents a left adrenal adenoma. The kidneys demonstrate a normal enhancement pattern bilaterally. A few small scattered cysts are seen. Multiple parapelvic cysts are noted bilaterally left greater than right. No calculi or obstructive changes are seen. The bladder is partially distended. No pelvic mass lesion is seen. The appendix is air-filled and within normal limits. Fecal material is noted throughout the colon. No obstructive changes are seen. No pelvic mass lesion is noted. Bony structures show degenerative change of the lumbar spine. No acute bony abnormality is seen. IMPRESSION: Left adrenal adenoma stable from previous exam of 2009. Scattered bilateral renal cysts. No acute abnormality is noted. No findings to correspond with the given clinical history are seen. Electronically Signed   By: Inez Catalina M.D.   On: 04/18/2015 18:22   I have personally reviewed and evaluated these images and lab results as part of my medical decision-making.   EKG Interpretation None      MDM   Final diagnoses:  Rectal bleeding    Patient with bright red blood per rectum was normal labs and exam except for heme positive stool. CT of abdomen negative. Possibly bleeding from hemorrhoids. Patient will follow up with GI    Milton Ferguson, MD 04/18/15 [phone]

## 2015-04-19 ENCOUNTER — Telehealth: Payer: Self-pay | Admitting: Internal Medicine

## 2015-04-19 NOTE — Telephone Encounter (Signed)
Pt scheduled to see Cecille Rubin Hvozdovic, PA-C 04/28/15@2 :30pm. Left message for pt to call back.  Spoke with pt and she is aware.

## 2015-04-20 ENCOUNTER — Telehealth: Payer: Self-pay | Admitting: Internal Medicine

## 2015-04-20 NOTE — Telephone Encounter (Signed)
Spoke with pt and let her know that if the bleeding is from hemorrhoids or perhaps a fissure she may continue to see some bleeding. Pt will keep her appt as scheduled.

## 2015-04-21 ENCOUNTER — Ambulatory Visit (INDEPENDENT_AMBULATORY_CARE_PROVIDER_SITE_OTHER): Payer: BLUE CROSS/BLUE SHIELD | Admitting: Internal Medicine

## 2015-04-21 ENCOUNTER — Encounter: Payer: Self-pay | Admitting: Internal Medicine

## 2015-04-21 ENCOUNTER — Other Ambulatory Visit (INDEPENDENT_AMBULATORY_CARE_PROVIDER_SITE_OTHER): Payer: BLUE CROSS/BLUE SHIELD

## 2015-04-21 VITALS — BP 130/90 | HR 75 | Wt 101.0 lb

## 2015-04-21 DIAGNOSIS — K625 Hemorrhage of anus and rectum: Secondary | ICD-10-CM

## 2015-04-21 DIAGNOSIS — F411 Generalized anxiety disorder: Secondary | ICD-10-CM

## 2015-04-21 DIAGNOSIS — K5901 Slow transit constipation: Secondary | ICD-10-CM

## 2015-04-21 LAB — CBC WITH DIFFERENTIAL/PLATELET
BASOS ABS: 0 10*3/uL (ref 0.0–0.1)
Basophils Relative: 0.4 % (ref 0.0–3.0)
Eosinophils Absolute: 0.1 10*3/uL (ref 0.0–0.7)
Eosinophils Relative: 1.4 % (ref 0.0–5.0)
HCT: 39.7 % (ref 36.0–46.0)
HEMOGLOBIN: 13.4 g/dL (ref 12.0–15.0)
LYMPHS ABS: 1.7 10*3/uL (ref 0.7–4.0)
Lymphocytes Relative: 23.1 % (ref 12.0–46.0)
MCHC: 33.8 g/dL (ref 30.0–36.0)
MCV: 91.2 fl (ref 78.0–100.0)
MONO ABS: 0.6 10*3/uL (ref 0.1–1.0)
MONOS PCT: 7.6 % (ref 3.0–12.0)
NEUTROS PCT: 67.5 % (ref 43.0–77.0)
Neutro Abs: 5 10*3/uL (ref 1.4–7.7)
Platelets: 207 10*3/uL (ref 150.0–400.0)
RBC: 4.36 Mil/uL (ref 3.87–5.11)
RDW: 12.6 % (ref 11.5–15.5)
WBC: 7.4 10*3/uL (ref 4.0–10.5)

## 2015-04-21 LAB — IBC PANEL
IRON: 67 ug/dL (ref 42–145)
SATURATION RATIOS: 16.8 % — AB (ref 20.0–50.0)
Transferrin: 285 mg/dL (ref 212.0–360.0)

## 2015-04-21 MED ORDER — LINACLOTIDE 145 MCG PO CAPS: 145.0000 ug | ORAL_CAPSULE | ORAL | Status: DC

## 2015-04-21 NOTE — Progress Notes (Signed)
Pre visit review using our clinic review tool, if applicable. No additional management support is needed unless otherwise documented below in the visit note. 

## 2015-04-21 NOTE — Assessment & Plan Note (Signed)
Pt had an acute profuse rectal bleeding on 04/18/15 - pt had labs and abd CT. Pt needs to f/u w/Dr Henrene Pastor. CBC

## 2015-04-21 NOTE — Progress Notes (Signed)
Subjective:  Patient ID: [name], female    DOB: 1950-10-29  Age: 64 y.o. MRN: [id number]  CC: No chief complaint on file.   HPI [name] presents for ER f/u for an acute profuse rectal bleeding on 04/18/15 - pt had labs and abd CT. Pt needs to f/u w/Dr Henrene Pastor.  Outpatient Prescriptions Prior to Visit  Medication Sig Dispense Refill  . ALREX 0.2 % SUSP Apply 0.2 drops to eye as needed (allergies).     [name] aspirin EC 81 MG tablet Take 1 tablet (81 mg total) by mouth daily. 100 tablet 3  . Botulinum Toxin Type A, Cosm, (BOTOX COSMETIC) 50 UNITS SOLR Inject 4-6 Units into the muscle. 3 month  injection    . conjugated estrogens (PREMARIN) vaginal cream INSERT 1 GRAM VAGINALLY ONCE A WEEK AS DIRECTED 30 g 3  . Cyanocobalamin (VITAMIN B-12 PO) Take 1 tablet by mouth daily.    . Diclofenac Sodium 2 % SOLN Apply twice daily. 112 g 1  . EPIPEN 2-PAK 0.3 MG/0.3ML SOAJ injection See admin instructions.  1  . fexofenadine (ALLEGRA ALLERGY CHILDRENS) 30 MG tablet Take 30 mg by mouth 2 (two) times daily as needed (allergies).    . fluocinonide cream (LIDEX) 1.01 % Apply 1 application topically 2 (two) times daily. 90 g 2  . fluticasone (FLONASE) 50 MCG/ACT nasal spray Place 1 spray into both nostrils as needed for allergies.   5  . Hyaluronic Acid (RESTYLANE IJ) Inject as directed every 6 (six) months.    . hydrOXYzine (ATARAX/VISTARIL) 10 MG tablet Take 1 tablet (10 mg total) by mouth 3 (three) times daily as needed. (Patient taking differently: Take 10 mg by mouth 3 (three) times daily as needed for itching. ) 30 tablet 0  . ibuprofen (ADVIL,MOTRIN) 200 MG tablet Take 400-600 mg by mouth as needed for headache or moderate pain.     [name] lidocaine (XYLOCAINE) 2 % solution Use as directed 5 mLs in the mouth or throat as needed for mouth pain. 100 mL 0  . Multiple Vitamins-Minerals (ICAPS AREDS 2) CAPS Take 1 capsule by mouth daily.    . pantoprazole (PROTONIX) 40 MG tablet Take 40 mg by  mouth daily.  11  . ranitidine (ZANTAC) 150 MG tablet Take 1 tablet (150 mg total) by mouth 2 (two) times daily. 180 tablet 3  . tretinoin (RETIN-A) 0.05 % cream Apply 1 application topically at bedtime.    . triamcinolone cream (KENALOG) 0.1 % Apply 1 application topically 2 (two) times daily. 30 g 0  . hydrocortisone (ANUSOL-HC) 25 MG suppository Place 1 suppository (25 mg total) rectally 2 (two) times daily. 14 suppository 2   No facility-administered medications prior to visit.    ROS Review of Systems  Constitutional: Negative for chills, activity change, appetite change, fatigue and unexpected weight change.  HENT: Negative for congestion, mouth sores and sinus pressure.   Eyes: Negative for visual disturbance.  Respiratory: Negative for cough and chest tightness.   Gastrointestinal: Negative for nausea and abdominal pain.  Genitourinary: Negative for frequency, difficulty urinating and vaginal pain.  Musculoskeletal: Negative for back pain and gait problem.  Skin: Negative for pallor and rash.  Neurological: Negative for dizziness, tremors, weakness, numbness and headaches.  Psychiatric/Behavioral: Negative for confusion and sleep disturbance.  constipated  Objective:  BP 130/90 mmHg  Pulse 75  Wt 101 lb (45.813 kg)  SpO2 97%  BP Readings from Last 3 Encounters:  04/21/15 130/90  04/18/15 142/77  03/15/15 130/78    Wt Readings from Last 3 Encounters:  04/21/15 101 lb (45.813 kg)  03/15/15 102 lb (46.267 kg)  03/08/15 103 lb (46.72 kg)    Physical Exam  Constitutional: She appears well-developed. No distress.  HENT:  Head: Normocephalic.  Right Ear: External ear normal.  Left Ear: External ear normal.  Nose: Nose normal.  Mouth/Throat: Oropharynx is clear and moist.  Eyes: Conjunctivae are normal. Pupils are equal, round, and reactive to light. Right eye exhibits no discharge. Left eye exhibits no discharge.  Neck: Normal [name] of motion. Neck supple. No JVD  present. No tracheal deviation present. No thyromegaly present.  Cardiovascular: Normal rate, regular rhythm and normal heart sounds.   Pulmonary/Chest: No stridor. No respiratory distress. She has no wheezes.  Abdominal: Soft. Bowel sounds are normal. She exhibits no distension and no mass. There is no tenderness. There is no rebound and no guarding.  Musculoskeletal: She exhibits no edema or tenderness.  Lymphadenopathy:    She has no cervical adenopathy.  Neurological: She displays normal reflexes. No cranial nerve deficit. She exhibits normal muscle tone. Coordination normal.  Skin: No rash noted. No erythema.  Psychiatric: She has a normal mood and affect. Her behavior is normal. Judgment and thought content normal.    Lab Results  Component Value Date   WBC 8.2 04/18/2015   HGB 12.8 04/18/2015   HCT 38.2 04/18/2015   PLT 202 04/18/2015   GLUCOSE 102* 04/18/2015   CHOL 173 03/16/2015   TRIG 66.0 03/16/2015   HDL 68.10 03/16/2015   LDLCALC 91 03/16/2015   ALT 18 04/18/2015   AST 20 04/18/2015   NA 138 04/18/2015   K 4.8 04/18/2015   CL 104 04/18/2015   CREATININE 0.79 04/18/2015   BUN 22* 04/18/2015   CO2 27 04/18/2015   TSH 1.78 09/24/2014    Ct Abdomen Pelvis W Contrast  04/18/2015  CLINICAL DATA:  Recent hemorrhoidal bleeding EXAM: CT ABDOMEN AND PELVIS WITH CONTRAST TECHNIQUE: Multidetector CT imaging of the abdomen and pelvis was performed using the standard protocol following bolus administration of intravenous contrast. CONTRAST:  14mL OMNIPAQUE IOHEXOL 300 MG/ML  SOLN COMPARISON:  03/08/2008 FINDINGS: The lung bases are free of acute infiltrate or sizable effusion. The liver, gallbladder, spleen, right adrenal gland and pancreas are within normal limits. The left adrenal gland demonstrates a 2.2 cm predominately hypodense lesion. It is stable in appearance from a prior exam and likely represents a left adrenal adenoma. The kidneys demonstrate a normal enhancement  pattern bilaterally. A few small scattered cysts are seen. Multiple parapelvic cysts are noted bilaterally left greater than right. No calculi or obstructive changes are seen. The bladder is partially distended. No pelvic mass lesion is seen. The appendix is air-filled and within normal limits. Fecal material is noted throughout the colon. No obstructive changes are seen. No pelvic mass lesion is noted. Bony structures show degenerative change of the lumbar spine. No acute bony abnormality is seen. IMPRESSION: Left adrenal adenoma stable from previous exam of 2009. Scattered bilateral renal cysts. No acute abnormality is noted. No findings to correspond with the given clinical history are seen. Electronically Signed   By: Inez Catalina M.D.   On: 04/18/2015 18:22    Assessment & Plan:   Diagnoses and all orders for this visit:  RECTAL BLEEDING -     CBC with Differential/Platelet; Future -     IBC panel; Future  Slow transit constipation  Other orders -  Linaclotide (LINZESS) 145 MCG CAPS capsule; Take 1 capsule (145 mcg total) by mouth 1 day or 1 dose.  I have discontinued Ms. Benham's hydrocortisone. I am also having her start on Linaclotide. Additionally, I am having her maintain her Botulinum Toxin Type A (Cosm), ibuprofen, ALREX, Hyaluronic Acid (RESTYLANE IJ), Diclofenac Sodium, fluticasone, tretinoin, hydrOXYzine, fluocinonide cream, EPIPEN 2-PAK, conjugated estrogens, triamcinolone cream, ranitidine, lidocaine, aspirin EC, pantoprazole, Cyanocobalamin (VITAMIN B-12 PO), ICAPS AREDS 2, and fexofenadine.  Meds ordered this encounter  Medications  . Linaclotide (LINZESS) 145 MCG CAPS capsule    Sig: Take 1 capsule (145 mcg total) by mouth 1 day or 1 dose.    Dispense:  30 capsule    Refill:  11     Follow-up: Return in about 3 months (around 07/22/2015) for a follow-up visit.  [name], MD

## 2015-04-21 NOTE — Assessment & Plan Note (Signed)
Doing well 

## 2015-04-21 NOTE — Assessment & Plan Note (Signed)
Will try Linzess

## 2015-04-28 ENCOUNTER — Ambulatory Visit (INDEPENDENT_AMBULATORY_CARE_PROVIDER_SITE_OTHER): Payer: BLUE CROSS/BLUE SHIELD | Admitting: Physician Assistant

## 2015-04-28 ENCOUNTER — Encounter: Payer: Self-pay | Admitting: Physician Assistant

## 2015-04-28 VITALS — BP 100/64 | HR 76 | Ht 61.5 in | Wt 103.2 lb

## 2015-04-28 DIAGNOSIS — K625 Hemorrhage of anus and rectum: Secondary | ICD-10-CM | POA: Diagnosis not present

## 2015-04-28 DIAGNOSIS — K648 Other hemorrhoids: Secondary | ICD-10-CM | POA: Diagnosis not present

## 2015-04-28 MED ORDER — HYDROCORTISONE ACETATE 25 MG RE SUPP: 25.0000 mg | Freq: Every day | RECTAL | Status: DC

## 2015-04-28 NOTE — Patient Instructions (Addendum)
Call back and ask to speak to Dr. Blanch Media nurse, Vaughan Basta, about hemorrhoid banding.   We have sent the following medications to your pharmacy for you to pick up at your convenience: Anusol suppositories use at bedtime as needed.

## 2015-04-28 NOTE — Progress Notes (Signed)
Patient ID: [name], female   DOB: April 09, 1951, 64 y.o.   MRN: [id number]   Subjective:    Patient ID: [name], female    DOB: 1951/01/13, 64 y.o.   MRN: [id number]  HPI  [name]  Is a pleasant 64 year old white female known to Dr. Marina Goodell who had undergone colonoscopy in December 2014 for screening. This was a normal exam , however she was noted to have internal hemorrhoids.  Patient comes in today with complaints of a recent episode of rectal bleeding. She says this occurred on October 31 and that she spontaneously just started noticing blood running down her leg. She continues to use for the next couple of hours and wasn't sure where she was bleeding from so went to the emergency room. She was seen and evaluated , labs were unremarkable with hemoglobin of 13.4 hematocrit of 39.7 and MCV of 90.2 serum iron of 67 transferrin normal iron sat slightly low at 16.8. Rectal exam it by the ER physician was unremarkable.  She was asked to follow-up here. She said for the next 2 days after that she saw a small amount of blood after she had a bowel movement but did not see any blood mixed in with the bowel movement. She never had any rectal pain or discomfort with this episode and no abdominal pain or discomfort. She says she does have a tendency to get constipated and had been constipated the week before but not just prior to that episode. She states that she has on several occasions felt that her hemorrhoids have prolapsed externally. She says she can feel large bulges periodically and has to gently push these back in. This generally occurs after a bowel movement.  She uses MiraLAX at home as needed , she also recently saw Dr. Posey Rea ,who gave her samples of Linzess though she has not started this.  Review of Systems Pertinent positive and negative review of systems were noted in the above HPI section.  All other review of systems was otherwise negative.  Outpatient Encounter  Prescriptions as of 04/28/2015  Medication Sig  . ALREX 0.2 % SUSP Apply 0.2 drops to eye as needed (allergies).   [name] aspirin EC 81 MG tablet Take 1 tablet (81 mg total) by mouth daily.  . Botulinum Toxin Type A, Cosm, (BOTOX COSMETIC) 50 UNITS SOLR Inject 4-6 Units into the muscle. 3 month  injection  . conjugated estrogens (PREMARIN) vaginal cream INSERT 1 GRAM VAGINALLY ONCE A WEEK AS DIRECTED  . Cyanocobalamin (VITAMIN B-12 PO) Take 1 tablet by mouth daily.  . Diclofenac Sodium 2 % SOLN Apply twice daily.  [name] EPIPEN 2-PAK 0.3 MG/0.3ML SOAJ injection See admin instructions.  . fexofenadine (ALLEGRA ALLERGY CHILDRENS) 30 MG tablet Take 30 mg by mouth 2 (two) times daily as needed (allergies).  . fluocinonide cream (LIDEX) 0.05 % Apply 1 application topically 2 (two) times daily.  . fluticasone (FLONASE) 50 MCG/ACT nasal spray Place 1 spray into both nostrils as needed for allergies.   . Hyaluronic Acid (RESTYLANE IJ) Inject as directed every 6 (six) months.  . hydrOXYzine (ATARAX/VISTARIL) 10 MG tablet Take 1 tablet (10 mg total) by mouth 3 (three) times daily as needed. (Patient taking differently: Take 10 mg by mouth 3 (three) times daily as needed for itching. )  . ibuprofen (ADVIL,MOTRIN) 200 MG tablet Take 400-600 mg by mouth as needed for headache or moderate pain.   [name] lidocaine (XYLOCAINE) 2 % solution Use as directed 5 mLs in the  mouth or throat as needed for mouth pain.  . Multiple Vitamins-Minerals (ICAPS AREDS 2) CAPS Take 1 capsule by mouth daily.  . pantoprazole (PROTONIX) 40 MG tablet Take 40 mg by mouth daily.  . ranitidine (ZANTAC) 150 MG tablet Take 1 tablet (150 mg total) by mouth 2 (two) times daily.  [name] tretinoin (RETIN-A) 0.05 % cream Apply 1 application topically at bedtime.  . triamcinolone cream (KENALOG) 0.1 % Apply 1 application topically 2 (two) times daily.  . hydrocortisone (ANUSOL-HC) 25 MG suppository Place 1 suppository (25 mg total) rectally at bedtime. X 7 days  as needed for hemorrhoid bleeding.  . Linaclotide (LINZESS) 145 MCG CAPS capsule Take 1 capsule (145 mcg total) by mouth 1 day or 1 dose. (Patient not taking: Reported on 04/28/2015)   No facility-administered encounter medications on file as of 04/28/2015.   Allergies  Allergen Reactions  . Codeine Other (See Comments)    Doesn't remeber  . Epinephrine     Heart racing   . Neomycin     Topical rash from cream  . Penicillins Hives and Other (See Comments)    Has patient had a PCN reaction causing immediate rash, facial/tongue/throat swelling, SOB or lightheadedness with hypotension: Yes Has patient had a PCN reaction causing severe rash involving mucus membranes or skin necrosis: Yes Has patient had a PCN reaction that required hospitalization No Has patient had a PCN reaction occurring within the last 10 years: No If all of the above answers are "NO", then may proceed with Cephalosporin use.   . Sulfonamide Derivatives Other (See Comments)    Doesn't remember   . Zithromax [Azithromycin] Rash   Patient Active Problem List   Diagnosis Date Noted  . Cotton wool spots 03/15/2015  . Left LBP 03/08/2015  . Food poisoning 03/08/2015  . Generalized anxiety disorder 03/08/2015  . Dysuria 02/23/2015  . Well adult exam 09/09/2014  . Internal hemorrhoids 09/07/2014  . Allergic urticaria 08/20/2014  . Acute sinusitis 08/18/2014  . Neck muscle spasm 07/30/2014  . Grief 06/17/2014  . Mild aortic insufficiency   . Mild tricuspid regurgitation   . Vasovagal syncope   . Premature atrial contractions   . GERD (gastroesophageal reflux disease)   . PVC (premature ventricular contraction)   . Dense breast tissue 03/23/2014  . Microhematuria 03/22/2014  . Rash and nonspecific skin eruption 12/22/2013  . Chronic meniscal tear of knee 12/21/2013  . Benign fasciculations 12/11/2013  . Strain of adductor magnus muscle of left lower extremity 05/21/2013  . Benign fasciculation-cramp syndrome  (HCC) 12/03/2012  . Vasovagal near syncope 11/02/2012  . Migraine with aura 11/02/2012  . Upper airway cough syndrome 08/16/2012  . Chest tightness 09/21/2011  . Bunion of great toe 09/11/2011  . Routine health maintenance 09/11/2011  . Mild mitral regurgitation by prior echocardiogram 05/15/2011  . Chronic neck pain 03/07/2011  . DYSPLASTIC NEVUS, FACE 02/06/2010  . HAND PAIN, BILATERAL 08/25/2009  . AORTIC INSUFFICIENCY, MILD 08/02/2009  . Abnormal involuntary movements(781.0) 07/28/2009  . LUMBAR SPRAIN AND STRAIN 05/21/2009  . HEMORRHOIDS-INTERNAL 12/15/2008  . Constipation 12/15/2008  . Irritable bowel syndrome 12/15/2008  . RECTAL BLEEDING 12/15/2008  . RENAL CALCULUS, HX OF 11/29/2007  . Herpes simplex without mention of complication 11/03/2007  . GLOBUS HYSTERICUS 01/01/2007  . RHINITIS, ALLERGIC NOS 01/01/2007   Social History   Social History  . Marital Status: Married    Spouse Name: Vernia Buff  . Number of Children: 0  . Years of Education: 2  Occupational History  . OFFICE liasion   . [employer]   . [employer]    Social History Main Topics  . Smoking status: Never Smoker   . Smokeless tobacco: Never Used  . Alcohol Use: No  . Drug Use: No  . Sexual Activity: Not on file   Other Topics Concern  . Not on file   Social History Narrative   [employer] in [place]. Married '90 - marriage is in very good shape '12, No children.  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   Patient is working Pensions consultant work.   Patient has a high school education.   Patient is right-handed.   Patient does not drink any caffeine.          Ms. Felling family history includes COPD in her father; Hyperlipidemia in her father and mother; Hypertension in  her father and mother. There is no history of Diabetes, Colon cancer, Breast cancer, Coronary artery disease, or Cancer.      Objective:    Filed Vitals:   04/28/15 1430  BP: 100/64  Pulse: 76    Physical Exam    Well-developed white female in no acute distress , appears  younger than stated age company by her husband. Blood pressure 100/64 pulse 75 height 5 foot 1 weight 103. HEENT; nontraumatic normocephalic EOMI PERRLA sclera anicteric , Cardiovascular ;regular rate and rhythm with S1-S2 no murmur rub or gallop, Pulmonary ;clear bilaterally , Abdomen; soft nontender nondistended bowel sounds are active there is no palpable mass or hepatosplenomegaly bowel sounds are present, Rectal ;exam no external hemorrhoids noted on digital exam no lesion felt stool brown and Hemoccult negative , patient showed me a picture of herself with clearly prolapsed internal hemorrhoids.  Extremities ;no clubbing cyanosis or edema skin warm and dry, Neuropsych ;mood and affect appropriate      Assessment & Plan:   #1 64 yo female with known internal hemorrhoids with episode of spontaneous rectal bleeding with oozing of blood   10 days ago, small amts of blood with BM's x 2 days then resolved. Bleeding consistent with bleeding from friable internal hemorrhoid Pt also has intermittent prolapsed internal hemorrhoids  #2 negative colonoscopy 2014 #3 Constipation  Plan; Continue Miralax 17 gm in 8 oz water daily  Increase water intake to 60 oz per day Anusol HC supp qhs x 7 days  Prn for recurrent bleeding   Had a lengthy conversation regarding in office banding of internal hemorrhoids- she want to think about it, education info provided and she is asked to call back if she would like to proceed and can get her scheduled with Dr Rhea Belton or Leone Payor.    Belma Dyches S Jereld Presti PA-C 04/28/2015   Cc: Plotnikov, Georgina Quint, MD

## 2015-04-28 NOTE — Progress Notes (Signed)
Reviewed.    Agree.

## 2015-05-03 ENCOUNTER — Ambulatory Visit: Payer: BLUE CROSS/BLUE SHIELD | Admitting: Adult Health

## 2015-05-09 ENCOUNTER — Encounter: Payer: Self-pay | Admitting: Internal Medicine

## 2015-05-16 ENCOUNTER — Encounter: Payer: Self-pay | Admitting: Neurology

## 2015-05-16 ENCOUNTER — Ambulatory Visit (INDEPENDENT_AMBULATORY_CARE_PROVIDER_SITE_OTHER): Payer: BLUE CROSS/BLUE SHIELD | Admitting: Neurology

## 2015-05-16 VITALS — Ht 62.0 in | Wt 106.0 lb

## 2015-05-16 DIAGNOSIS — R253 Fasciculation: Secondary | ICD-10-CM | POA: Diagnosis not present

## 2015-05-16 NOTE — Progress Notes (Signed)
[facility]  Provider:  Dr Brett Fairy Referring Provider: Alain [name] [name] Lacks, MD Primary Care Physician:  [name] Kehr, MD    HPI:  [name] is a 64 y.o. female here as a referral from Dr. Alain [name] for follow up on her condition of  benign fasciculation.   05-16-15 [name] reports today that she has not noticed as many fasciculations since she has reduced her caffeine and chocolate intake. This is a great result. She had only one health scare. She developed a allergic reaction to a Z-pack. She got quite sick with it nauseated and having hives but this passed. A couple of weeks or months later she also had a bout of poisoning. Soon after she had a routine appointment with her ophthalmologist, Dr. Katy [name]. He noted a cotton wool change in one of her eyes. He explained that there could be a lot of reasons for developing this and referred the patient back to her primary care physician, Dr. [place] Sink [name]. Dr. Alain [name] obtained a long list of metabolic and hematologic laboratory tests, all of which returned normal. She was also seen by Dr. Zadie [name], a retinal ophthalmology specialist. He confirmed a cotton-wool finding Within a month or 2 she had a follow-up appointment with Dr. Katy [name], and the [facility] area had resolved. Her husband is now 35 - she stated he is still flying.   2015 She has meanwhile reduced or eliminated her caffeine intake and noticed a reduction in twitches. Her results of the EMG and NCV study have helped her to relax and fee safe with her condition.  She is snoring now, which doesn't bother her but her spouse. She has noted a feeling of thumping at the right ear and her ENT suggested that this may be a manifestation of her fasciculations.  Her parents lives in [place], [place]. Close to her sister, her father is in a rehab facility.  Her husband is an Emergency planning/management officer, now with [employer].    Last visit note :  The patient reports that she  had 2 spells of near-syncope one in May 2014 and one in June of 2014 . She also has a history of migraines with aura, benign fasciculations which have affected also facial muscles, at times presenting like a tic. The benign fasciculations have been present for over 2 decades and are not related to a minor atrophic lateral sclerosis. Pulse of the near-syncope spells but the patient reported had a diplopia component and appeared to have been experienced as a vertical. The patient reports that the first spell was likely related to dehydration but is not quite sure about the second. The spells don't last long and if she takes fluid and fluid they quickly resolve and do not return. She was sent by her primary care physician for an MRI of the brain which returned normal. She has been seen by Dr. Katy [name]  for the brief dizziness, loss of vision , diplopia.   She is diagnosed of with macular degeneration early stage , myopia, and ocular migraines.  Vision was 20/25 in both eyes, ocular  pressure was 18 in the right, and 19 mm in the left eye and Dr. Katy [name]  reported that the patient is early cataract formation but can see well. The optic nerves are healthy , but there is a macular drusen in the right eye he recommended ocular vitamins and to continue contact lens use the patient was otherwise found to be in good health ,and he did not impose any restrictions  on driving activities of daily living etc. The detailed report is reviewed here today as is her MRI report which was entirely normal.  I explained that migraines can change to an [place] state with reaching the 5th decade of life. Many women that experienced severe nausea and headaches associated with menstrual cycles, has later in life the aura i without  headaches to follow . Is benign fasciculations there is truly not changed there is no treatment available nor is it necessary. Is important for the patient to stay hydrated and could not get hypoglycemic as  these will lead to presyncopal spells as well as an aunt who took and frequency rise in fasciculations, and  asked her to restrict caffeine intake.    Review of Systems: Out of a complete 14 system review, the patient complains of only the following symptoms, and all other reviewed systems are negative. Less twitching . Snoring reported again, she is complaining of nasal drip , eye itching . She is treated by [name] Lacy, MD at [facility] allergy. She inquired about a sleep evaluation. I would like for her to use her nasal spray at night to see if she would stop mouth breathing which is the main reason for snoring. If this alone a successful she would not need to return for a sleep evaluation, but I will be happy to offer one if this is not the solution to her problem. She prefers a supine sleep position.  Social History   Social History  . Marital Status: Married    Spouse Name: [name]  . Number of Children: 0  . Years of Education: 14   Occupational History  . OFFICE liasion   . [employer]   . [employer]    Social History Main Topics  . Smoking status: Never Smoker   . Smokeless tobacco: Never Used  . Alcohol Use: No  . Drug Use: No  . Sexual Activity: Not on file   Other Topics Concern  . Not on file   Social History Narrative   [employer]  in [place]. Married '90 - marriage is in very good shape '12, No children.  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 Offerred medical services for them if needed (Nov '11)   Patient is married [name]) and lives at home with her husband.   Patient is working Administrator, arts work.   Patient has a high school education.   Patient is right-handed.   Patient does not drink any caffeine.          Family History  Problem Relation Age of Onset  . Hyperlipidemia Father   . COPD Father     Smoker, deceased 06/02/2014  . Hyperlipidemia Mother    . Diabetes Neg Hx   . Colon cancer Neg Hx   . Breast cancer Neg Hx   . Coronary artery disease Neg Hx   . Cancer Neg Hx     breast or colon  . Hypertension Father   . Hypertension Mother     Past Medical History  Diagnosis Date  . Mild aortic insufficiency   . Mild tricuspid regurgitation   . Internal hemorrhoids without mention of complication   . Unspecified constipation   . Irritable bowel syndrome   . Hemorrhage of rectum and anus   . Microscopic hematuria   . Personal history of urinary calculi   . Other plastic surgery for  unacceptable cosmetic appearance     Inj tx fllers/expander  . Esophageal reflux   . Postmenopausal atrophic vaginitis   . Backache, unspecified   . Allergic rhinitis, cause unspecified   . Conversion disorder   . Calculus of kidney   . Migraine headache with aura   . Benign fasciculation-cramp syndrome ([place]) 12/03/2012     Worsening with  stress, fatigue .  [name] Osteopenia   . Premature atrial contractions   . Vasovagal syncope   . GERD (gastroesophageal reflux disease)   . PVC (premature ventricular contraction)     a. Event monitor 2013.  [name] Cotton wool spots     Past Surgical History  Procedure Laterality Date  . Cosmetic procedures w/injection therapy    . Wisdom tooth extraction    . Breast biopsy Right   . Skin cancer excision Left     thigh    Current Outpatient Prescriptions  Medication Sig Dispense Refill  . ALREX 0.2 % SUSP Apply 0.2 drops to eye as needed (allergies).     [name] aspirin EC 81 MG tablet Take 1 tablet (81 mg total) by mouth daily. 100 tablet 3  . Botulinum Toxin Type A, Cosm, (BOTOX COSMETIC) 50 UNITS SOLR Inject 4-6 Units into the muscle. 3 month  injection    . conjugated estrogens (PREMARIN) vaginal cream INSERT 1 GRAM VAGINALLY ONCE A WEEK AS DIRECTED 30 g 3  . Cyanocobalamin (VITAMIN B-12 PO) Take 1 tablet by mouth daily.    . Diclofenac Sodium 2 % SOLN Apply twice daily. 112 g 1  . EPIPEN 2-PAK 0.3 MG/0.3ML  SOAJ injection See admin instructions.  1  . fexofenadine (ALLEGRA ALLERGY CHILDRENS) 30 MG tablet Take 30 mg by mouth 2 (two) times daily as needed (allergies).    . fluocinonide cream (LIDEX) AB-123456789 % Apply 1 application topically 2 (two) times daily. 90 g 2  . fluticasone (FLONASE) 50 MCG/ACT nasal spray Place 1 spray into both nostrils as needed for allergies.   5  . Hyaluronic Acid (RESTYLANE IJ) Inject as directed every 6 (six) months.    . hydrocortisone (ANUSOL-HC) 25 MG suppository Place 1 suppository (25 mg total) rectally at bedtime. X 7 days as needed for hemorrhoid bleeding. 12 suppository 2  . hydrOXYzine (ATARAX/VISTARIL) 10 MG tablet Take 1 tablet (10 mg total) by mouth 3 (three) times daily as needed. (Patient taking differently: Take 10 mg by mouth 3 (three) times daily as needed for itching. ) 30 tablet 0  . ibuprofen (ADVIL,MOTRIN) 200 MG tablet Take 400-600 mg by mouth as needed for headache or moderate pain.     [name] lidocaine (XYLOCAINE) 2 % solution Use as directed 5 mLs in the mouth or throat as needed for mouth pain. 100 mL 0  . Linaclotide (LINZESS) 145 MCG CAPS capsule Take 1 capsule (145 mcg total) by mouth 1 day or 1 dose. 30 capsule 11  . Multiple Vitamins-Minerals (ICAPS AREDS 2) CAPS Take 1 capsule by mouth daily.    . ranitidine (ZANTAC) 150 MG tablet Take 1 tablet (150 mg total) by mouth 2 (two) times daily. 180 tablet 3  . tretinoin (RETIN-A) 0.05 % cream Apply 1 application topically at bedtime.    . triamcinolone cream (KENALOG) 0.1 % Apply 1 application topically 2 (two) times daily. 30 g 0   No current facility-administered medications for this visit.    Allergies as of 05/16/2015 - Review Complete 05/16/2015  Allergen Reaction Noted  . Codeine Other (See Comments)   .  Epinephrine  08/12/2012  . Neomycin  03/08/2015  . Penicillins Hives and Other (See Comments)   . Sulfonamide derivatives Other (See Comments)   . Zithromax [azithromycin] Rash 08/18/2014     Vitals: Ht 5\' 2"  (1.575 m)  Wt 106 lb (48.081 kg)  BMI 19.38 kg/m2 Last Weight:  Wt Readings from Last 1 Encounters:  05/16/15 106 lb (48.081 kg)   Last Height:   Ht Readings from Last 1 Encounters:  05/16/15 5\' 2"  (1.575 m)   Vision Screening:  Left eye with correction 25/20.  Right eye with correction 25/20. Monovision, contacts.   Physical exam:  General: The patient is awake, alert and appears not in acute distress. The patient is well groomed. Head: Normocephalic, atraumatic. Neck is supple. Mallampati 1- wide open-  neck circumference 12.5 inches, No retrognathia. TMJ click on the right.  Cardiovascular:  Regular rate and rhythm, without  murmurs or carotid bruit, and without distended neck veins. Respiratory: Lungs are clear to auscultation. Skin:  Without evidence of edema, or rash Trunk: BMI low normal.  Neurologic exam : The patient is awake and alert, oriented to place and time.   Memory subjective described as intact. There is a normal attention span & concentration ability.  Speech is fluent without  dysarthria, dysphonia or aphasia. Mood and affect are appropriate.  Cranial nerves: Pupils are equal and briskly reactive to light. Extraocular movements  in vertical and horizontal planes intact and without nystagmus.  Hearing to finger rub intact.   Facial sensation intact to fine touch. Facial motor strength is symmetric and tongue and uvula move midline. Motor exam: Normal tone and normal muscle bulk and symmetric normal strength in all extremities. No fasciculations.  Sensory:  Fine touch, pinprick and vibration were normal  in all extremities.  Proprioception is tested in the upper extremities only. This was normal. Coordination: Rapid alternating movements in the fingers/hands isnormal.  Finger-to-nose maneuver tested and normal without evidence of ataxia, dysmetria or tremor. Gait and station: Patient walks without assistive device.  Strength within normal  limits. Stance is stable and normal.  Tandem gait isunfragmented. Romberg testing normal. Deep tendon reflexes: in the upper and lower extremities are symmetric and intact.    Assessment:  After physical and neurologic examination, review of laboratory studies, imaging, neurophysiology testing and pre-existing records. -  Benign fasciculations resolved !  Dr. Zenia Resides records reviewed, Dr. Dahlia Bailiff were not available.  Dr. Judeen Hammans tests were available.    Lisle Skillman, MD  CC Dr. Patrecia Pour

## 2015-05-27 ENCOUNTER — Ambulatory Visit: Payer: BLUE CROSS/BLUE SHIELD | Admitting: Family Medicine

## 2015-06-01 ENCOUNTER — Ambulatory Visit: Payer: BLUE CROSS/BLUE SHIELD | Admitting: Neurology

## 2015-06-09 ENCOUNTER — Telehealth: Payer: Self-pay | Admitting: Cardiovascular Disease

## 2015-06-09 NOTE — Telephone Encounter (Signed)
New problem   Pt want to know if she need to schedule a doppler for her next visit. Please advise

## 2015-06-09 NOTE — Telephone Encounter (Signed)
Pt wanted to know if she needed to have an echo done prior to her next appt. Last echo was March 2015.  Pt due to see Dr. Angelena Form March 2017. Advised pt I would send to Dr. Angelena Form and his nurse, Fraser Din for review, advisement and follow up.

## 2015-06-14 NOTE — Telephone Encounter (Signed)
Given her mild AI on echo March 2015, I would recommend repeat echo in March 2018. She does not need an echo before I see her in 2017. cdm

## 2015-06-15 NOTE — Telephone Encounter (Signed)
Spoke with pt and gave her information from Dr. Angelena Form. Appt made for her to see Dr. Angelena Form on March 24,2017 at 9:00

## 2015-06-15 NOTE — Telephone Encounter (Signed)
Pt rtn Pat's call-told her what Dr.Mcalhany said aboutthe Echo not needing to be done this year but in March of 2018-pt states that Dr. Verl Blalock did one every year and Dr.Mcalhany wanted to do one every other year, according to her she is du ethis year and wants to make sure she doesn't need to done this year-also needs to schedule March recall

## 2015-06-15 NOTE — Telephone Encounter (Signed)
I placed call to pt. Mailbox full. Unable to leave a message

## 2015-06-17 ENCOUNTER — Ambulatory Visit (INDEPENDENT_AMBULATORY_CARE_PROVIDER_SITE_OTHER)
Admission: RE | Admit: 2015-06-17 | Discharge: 2015-06-17 | Disposition: A | Discharge status: Home / Self Care | Payer: BLUE CROSS/BLUE SHIELD | Source: Ambulatory Visit | Attending: Family Medicine | Admitting: Family Medicine

## 2015-06-17 ENCOUNTER — Ambulatory Visit (INDEPENDENT_AMBULATORY_CARE_PROVIDER_SITE_OTHER): Payer: BLUE CROSS/BLUE SHIELD | Admitting: Family Medicine

## 2015-06-17 ENCOUNTER — Encounter: Payer: Self-pay | Admitting: Family Medicine

## 2015-06-17 VITALS — BP 108/74 | HR 67 | Ht 61.5 in | Wt 102.0 lb

## 2015-06-17 DIAGNOSIS — M542 Cervicalgia: Secondary | ICD-10-CM

## 2015-06-17 DIAGNOSIS — M5010 Cervical disc disorder with radiculopathy, unspecified cervical region: Secondary | ICD-10-CM

## 2015-06-17 NOTE — Progress Notes (Signed)
Pre visit review using our clinic review tool, if applicable. No additional management support is needed unless otherwise documented below in the visit note. 

## 2015-06-17 NOTE — Patient Instructions (Signed)
Good ot see you [name] 20 minutes 2 times daily. Usually after activity and before bed. No overhead activity until seen by PT PT will be calling you Conitnue the motrin 400mg  2 times a day scheduled See me again in 3-4 weeks.

## 2015-06-17 NOTE — Progress Notes (Signed)
  [name] [address], [place] [address] Phone: [phone] Subjective:    I'm seeing this patient by the request  of:  [name], MD   CC: Neck pain  [id number] Christna Modglin is a 64 y.o. female coming in with complaint of neck pain. Patient is had some neck pain intermittently. Did not respond very well to osteopathic manipulation. Patient has had known arthritic changes in the neck but seems to be worsening. States that certain range of motion causes and intense pain. Some mild radiation going to the left middle finger. States that certain positioning can also cause even a numbness. Patient denies any significant weakness. States that even she has to get her head and properly positioned at night.. Patient has been working with Product/process development scientist and does some stretching but only doesn't occasionally.    Past medical history, social, surgical and family history all reviewed in electronic medical record.   Review of Systems: No headache, visual changes, nausea, vomiting, diarrhea, constipation, dizziness, abdominal pain, skin rash, fevers, chills, night sweats, weight loss, swollen lymph nodes, body aches, joint swelling, muscle aches, chest pain, shortness of breath, mood changes.   Objective Blood pressure 108/74, pulse 67, height 5' 1.5" (1.562 m), weight 102 lb (46.267 kg), SpO2 97 %.  General: No apparent distress alert and oriented x3 mood and affect normal, dressed appropriately.  HEENT: Pupils equal, extraocular movements intact  Respiratory: Patient's speak in full sentences and does not appear short of breath  Cardiovascular: No lower extremity edema, non tender, no erythema  Skin: Warm dry intact with no signs of infection or rash on extremities or on axial skeleton.  Abdomen: Patient does have involuntary guarding of the left lower quadrant as well as the epigastric region. No rebound tenderness noted. No masses  palpated. Neuro: Cranial nerves II through XII are intact, neurovascularly intact in all extremities with 2+ DTRs and 2+ pulses.  Lymph: No lymphadenopathy of posterior or anterior cervical chain or axillae bilaterally.  Gait normal with good balance and coordination.  MSK:  Non tender with full range of motion and good stability and symmetric strength and tone of shoulders, elbows, wrist, hip, knee and ankles bilaterally.  Neck: Inspection unremarkable. No palpable stepoffs. Positive Spurling's maneuver with mild radicular symptoms to the left middle finger. Lacks last 5 of right-sided rotation and left-sided side bending Grip strength and sensation normal in bilateral hands Strength good C4 to T1 distribution No sensory change to C4 to T1 Negative Hoffman sign bilaterally Reflexes normal    Impression and Recommendations:     This case required medical decision making of moderate complexity.

## 2015-06-17 NOTE — Assessment & Plan Note (Signed)
Reviewed and visualized independently patient's previous x-rays for the degenerative disc disease. I do feel that new x-rays are needed because of the duration since. This is also a change in worsening symptoms. Patient was given medication to potentially help. We discussed the possibility of prednisone which patient declined and patient will do instead anti-inflammatory. We discussed the possibility of gabapentin which patient also declined. Discuss the possibility of formal physical therapy which patient did agree to. Depending on patient response we may also need to consider advanced imaging especially if the numbness becomes chronic or there is any significant weakness. Patient knows when to seek medical attention. Otherwise patient will follow-up with me again in 2-3 weeks for further evaluation and treatment.

## 2015-06-28 ENCOUNTER — Ambulatory Visit: Payer: BLUE CROSS/BLUE SHIELD

## 2015-07-06 ENCOUNTER — Encounter: Payer: Self-pay | Admitting: Physical Therapy

## 2015-07-06 ENCOUNTER — Ambulatory Visit: Payer: BLUE CROSS/BLUE SHIELD | Admitting: Family Medicine

## 2015-07-06 ENCOUNTER — Ambulatory Visit: Payer: BLUE CROSS/BLUE SHIELD | Attending: Family Medicine | Admitting: Physical Therapy

## 2015-07-06 DIAGNOSIS — M436 Torticollis: Secondary | ICD-10-CM | POA: Insufficient documentation

## 2015-07-06 DIAGNOSIS — M542 Cervicalgia: Secondary | ICD-10-CM

## 2015-07-06 DIAGNOSIS — R29898 Other symptoms and signs involving the musculoskeletal system: Secondary | ICD-10-CM | POA: Insufficient documentation
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
## 2015-07-06 NOTE — Patient Instructions (Signed)
AROM: Lateral Neck Flexion    Slowly tilt head toward one shoulder, then the other.Place opposite hand under buttocks.  Hold each position _5___ seconds. Repeat __3__ times per set. Do __1__ sets per session. Do _2___ sessions per day. Then perform on opposite side.  http://orth.exer.us/296   Copyright  VHI. All rights reserved.  AROM: Neck Rotation    Turn head slowly to look over one shoulder, then the other. Hold each position _5___ seconds. Repeat __3__ times per set. Do _1___ sets per session. Do __2__ sessions per day.  http://orth.exer.us/294   Copyright  VHI. All rights reserved.  Flexibility: Neck Retraction    Pull head straight back, keeping eyes and jaw level.hold 5 sec Repeat __3__ times per set. Do __1__ sets per session. Do _3___ sessions per day.  http://orth.exer.us/344   Copyright  VHI. All rights reserved.  [facility] [address], [place], [place] [address] Phone # [phone] Fax [phone]

## 2015-07-08 ENCOUNTER — Ambulatory Visit: Payer: BLUE CROSS/BLUE SHIELD | Admitting: Podiatry

## 2015-07-11 ENCOUNTER — Ambulatory Visit: Payer: BLUE CROSS/BLUE SHIELD

## 2015-07-11 ENCOUNTER — Ambulatory Visit (INDEPENDENT_AMBULATORY_CARE_PROVIDER_SITE_OTHER): Payer: BLUE CROSS/BLUE SHIELD | Admitting: Family Medicine

## 2015-07-11 ENCOUNTER — Ambulatory Visit (INDEPENDENT_AMBULATORY_CARE_PROVIDER_SITE_OTHER): Payer: BLUE CROSS/BLUE SHIELD | Admitting: Podiatry

## 2015-07-11 ENCOUNTER — Encounter: Payer: Self-pay | Admitting: Podiatry

## 2015-07-11 ENCOUNTER — Encounter: Payer: Self-pay | Admitting: Family Medicine

## 2015-07-11 VITALS — BP 114/74 | HR 62 | Wt 103.0 lb

## 2015-07-11 VITALS — BP 111/62 | HR 71 | Resp 14

## 2015-07-11 DIAGNOSIS — M5010 Cervical disc disorder with radiculopathy, unspecified cervical region: Secondary | ICD-10-CM

## 2015-07-11 DIAGNOSIS — L84 Corns and callosities: Secondary | ICD-10-CM | POA: Diagnosis not present

## 2015-07-11 NOTE — Patient Instructions (Signed)
Good to see you  Ice is your friend Keep up with thew activities ad PT will be great  I love what you are doing We will put you on a lifting restriction for next 4 weeks pennsaid pinkie amount topically 2 times daily as needed.  Instead of advil try the duexis 1 time and see if you like it See me again in 4 weeks.

## 2015-07-11 NOTE — Progress Notes (Signed)
  [name] [place], [place] [address] Phone: [phone] Subjective:    I'm seeing this patient by the request  of:  [name], MD   CC: Neck pain follow-up  [id number] [name] is a 65 y.o. female coming in with complaint of neck pain. Patient was found to have degenerative disc disease at multiple levels of the cervical spine. Patient will had symptoms ever concerning for cervical radiculopathy. Patient was to start working with formal physical therapy but due to weather she was unable to go home and has only been one time. Patient though is motivated and optimistic that it will help. States that the pain is approximately 25% better. Has changed certain activities at work which she thinks has been helpful. Patient has been doing the icing. Takes Advil occasionally. No New symptoms.    Past medical history, social, surgical and family history all reviewed in electronic medical record.   Review of Systems: No headache, visual changes, nausea, vomiting, diarrhea, constipation, dizziness, abdominal pain, skin rash, fevers, chills, night sweats, weight loss, swollen lymph nodes, body aches, joint swelling, muscle aches, chest pain, shortness of breath, mood changes.   Objective Blood pressure 114/74, pulse 62, weight 103 lb (46.72 kg).  General: No apparent distress alert and oriented x3 mood and affect normal, dressed appropriately.  HEENT: Pupils equal, extraocular movements intact  Respiratory: Patient's speak in full sentences and does not appear short of breath  Cardiovascular: No lower extremity edema, non tender, no erythema  Skin: Warm dry intact with no signs of infection or rash on extremities or on axial skeleton.  Abdomen: Patient does have involuntary guarding of the left lower quadrant as well as the epigastric region. No rebound tenderness noted. No masses palpated. Neuro: Cranial nerves II through XII are intact,  neurovascularly intact in all extremities with 2+ DTRs and 2+ pulses.  Lymph: No lymphadenopathy of posterior or anterior cervical chain or axillae bilaterally.  Gait normal with good balance and coordination.  MSK:  Non tender with full range of motion and good stability and symmetric strength and tone of shoulders, elbows, wrist, hip, knee and ankles bilaterally.  Neck: Inspection unremarkable. No palpable stepoffs. Continued positive Spurling's with radicular symptoms to the left middle finger Lacks last 5 of right-sided rotation and left-sided side bending Grip strength and sensation normal in bilateral hands Strength good C4 to T1 distribution No sensory change to C4 to T1 Negative Hoffman sign bilaterally Reflexes normal    Impression and Recommendations:     This case required medical decision making of moderate complexity.

## 2015-07-11 NOTE — Assessment & Plan Note (Signed)
X-rays were independently visualized by me today. Multilevel arthropathy with slight increase in facet hypertrophy at several levels compared to prior study. Disc space narrowing at C4-5 and C5-6 is stable. No fracture or spondylolisthesis  Concerned the patient does have a nerve root impingement. We discussed icing regimen, home exercises that I do think formal physical therapy will be beneficial. We did put patient on work restrictions avoiding significant heavy lifting. We discussed continuing the other over-the-counter medications. Patient and will come back and see me again in 4 weeks. At that time hopefully we will be able to lift the restrictions and stopped formal physical therapy as long she is doing well. If any worsening symptoms MRI would likely be necessary.  Spent  25 minutes with patient face-to-face and had greater than 50% of counseling including as described above in assessment and plan.

## 2015-07-12 NOTE — Progress Notes (Signed)
Subjective:     Patient ID: [name], female   DOB: 10/29/1950, 65 y.o.   MRN: [id number]  HPI patient presents with painful lesions on the bottom of both feet   Review of Systems     Objective:   Physical Exam Neurovascular status intact with keratotic lesions plantar aspect bilateral    Assessment:     Porokeratotic lesions bilateral    Plan:     Debris lesions bilateral with no iatrogenic bleeding noted

## 2015-07-14 ENCOUNTER — Encounter: Payer: Self-pay | Admitting: Physical Therapy

## 2015-07-14 ENCOUNTER — Ambulatory Visit: Payer: BLUE CROSS/BLUE SHIELD | Admitting: Physical Therapy

## 2015-07-14 DIAGNOSIS — M436 Torticollis: Secondary | ICD-10-CM

## 2015-07-14 DIAGNOSIS — M542 Cervicalgia: Secondary | ICD-10-CM

## 2015-07-14 NOTE — Patient Instructions (Signed)
Computer Work    Position work to Programmer, multimedia. Use proper work and seat height. Keep shoulders back and down, wrists straight, and elbows at right angles. Use chair that provides full back support. Add footrest and lumbar roll as needed.   Copyright  VHI. All rights reserved.  Posture - Sitting    Sit upright, head facing forward. Try using a roll to support lower back. Keep shoulders relaxed, and avoid rounded back. Keep hips level with knees. Avoid crossing legs for long periods.   Copyright  VHI. All rights reserved.  Sleeping on Back    Place pillow under knees. A pillow with cervical support and a roll around waist are also helpful.   Copyright  VHI. All rights reserved.  Reading    When reading, hold material in tilted position and maintain good sitting posture.   Copyright  VHI. All rights reserved.  Keeping Chin Tucked    Keep chin tucked and shoulders back when picking up objects.   Copyright  VHI. All rights reserved.  [facility] [address] [place], [place] [address] Phone # [phone] Fax [phone]

## 2015-07-14 NOTE — Therapy (Signed)
[facility] [address], [place], [place], [address] Phone: [phone]   Fax:  [phone]  Physical Therapy Treatment  Patient Details  Name: [name] MRN: [id number] Date of Birth: [birth year] Referring Provider: Dr. Hulan Saas  Encounter Date: 07/14/2015      PT End of Session - 07/14/15 0846    Visit Number 2   Date for PT Re-Evaluation 08/17/15   PT Start Time 0800   PT Stop Time 0843   PT Time Calculation (min) 43 min   Activity Tolerance Patient tolerated treatment well   Behavior During Therapy [facility] for tasks assessed/performed      Past Medical History  Diagnosis Date  . Mild aortic insufficiency   . Mild tricuspid regurgitation   . Internal hemorrhoids without mention of complication   . Unspecified constipation   . Irritable bowel syndrome   . Hemorrhage of rectum and anus   . Microscopic hematuria   . Personal history of urinary calculi   . Other plastic surgery for unacceptable cosmetic appearance     Inj tx fllers/expander  . Esophageal reflux   . Postmenopausal atrophic vaginitis   . Backache, unspecified   . Allergic rhinitis, cause unspecified   . Conversion disorder   . Calculus of kidney   . Migraine headache with aura   . Benign fasciculation-cramp syndrome ([place]) 12/03/2012     Worsening with  stress, fatigue .  [name] Osteopenia   . Premature atrial contractions   . Vasovagal syncope   . GERD (gastroesophageal reflux disease)   . PVC (premature ventricular contraction)     a. Event monitor 2013.  [name] Cotton wool spots     Past Surgical History  Procedure Laterality Date  . Cosmetic procedures w/injection therapy    . Wisdom tooth extraction    . Breast biopsy Right   . Skin cancer excision Left     thigh    There were no vitals filed for this visit.  Visit Diagnosis:  Stiffness of cervical spine  Cervical pain      Subjective Assessment - 07/14/15 0807    Subjective I have a sore spot in my neck.    Patient Stated Goals decrease pain   Currently in Pain? Yes   Pain Score 3    Pain Location Neck   Pain Orientation Right;Left   Pain Descriptors / Indicators Aching   Pain Type Chronic pain   Pain Onset More than a month ago   Pain Frequency Intermittent   Aggravating Factors  pulling and lifting, wake up at night   Pain Relieving Factors rest   Effect of Pain on Daily Activities lifting   Multiple Pain Sites No                         [facility] Adult PT Treatment/Exercise - 07/14/15 0001    Therapeutic Activites    Therapeutic Activities Other Therapeutic Activities  holding her neck while eating and on phone   Other Therapeutic Activities how to adjust her pillow   Manual Therapy   Manual Therapy Soft tissue mobilization;Joint mobilization   Joint Mobilization side glide to cervical   Soft tissue mobilization cervical paraspinals, scalenes, upper trap, levator scapulae, SCM                PT Education - 07/14/15 4742    Education provided Yes   Education Details body mechanics to  decrease strain on cervical  Person(s) Educated Patient   Methods Explanation;Demonstration;Verbal cues;Handout   Comprehension Returned demonstration;Verbalized understanding          PT Short Term Goals - 07/14/15 0849    PT SHORT TERM GOAL #1   Title independent with initial HEP   Time 3   Period Weeks   Status On-going  still learning   PT SHORT TERM GOAL #2   Title pain with pulling items decreased >/= 25%   Time 3   Period Weeks   Status On-going   PT SHORT TERM GOAL #3   Title understand correct body mechanics with work tasks to decrease strain on cervical   Time 3   Period Weeks   Status Achieved           PT Long Term Goals - 07/06/15 1250    PT LONG TERM GOAL #2   Title return to exercise with correct body mechanics with minimal cervical pain   Time 6   Period Weeks   Status New   PT LONG TERM  GOAL #3   Title pulling items with   minimal cervical pain   Time 6   Period Weeks   Status New   PT LONG TERM GOAL #4   Title lift items with minimal pain due to shoulder strength 5/5   Time 6   Period Weeks   Status New               Plan - 07/14/15 0846    Clinical Impression Statement Patient is a 65 year old female with chronic cervcial pain.  Patient needs verbal cues to not extend subocciptal to tighten her cervical muscle.  After therapy patient had decreased pain. Patient only had one session so she has not met goals.  Patient would benefit from pskilled therapy to reduce pain and cervical mobility.    Pt will benefit from skilled therapeutic intervention in order to improve on the following deficits Pain;Decreased mobility;Decreased strength;Increased muscle spasms;Decreased range of motion   Rehab Potential Excellent   Clinical Impairments Affecting Rehab Potential None   PT Frequency 2x / week   PT Duration 6 weeks   PT Treatment/Interventions Electrical Stimulation;Cryotherapy;Moist Heat;Therapeutic exercise;Therapeutic activities;Ultrasound;Neuromuscular re-education;Patient/family education;Manual techniques;Dry needling   PT Next Visit Plan soft tissue work, cervical thoracic stabilization exercises, shoulder strength   PT Home Exercise Plan progress as needed   Consulted and Agree with Plan of Care Patient        Problem List Patient Active Problem List   Diagnosis Date Noted  . Cervical disc disorder with radiculopathy of cervical region 06/17/2015  . Cotton wool spots 03/15/2015  . Left LBP 03/08/2015  . Food poisoning 03/08/2015  . Generalized anxiety disorder 03/08/2015  . Dysuria 02/23/2015  . Well adult exam 09/09/2014  . Internal hemorrhoids 09/07/2014  . Allergic urticaria 08/20/2014  . Acute sinusitis 08/18/2014  . Neck muscle spasm 07/30/2014  . Grief 06/17/2014  . Mild aortic insufficiency   . Mild tricuspid regurgitation   . Vasovagal  syncope   . Premature atrial contractions   . GERD (gastroesophageal reflux disease)   . PVC (premature ventricular contraction)   . Dense breast tissue 03/23/2014  . Microhematuria 03/22/2014  . Rash and nonspecific skin eruption 12/22/2013  . Chronic meniscal tear of knee 12/21/2013  . Benign fasciculations 12/11/2013  . Strain of adductor magnus muscle of left lower extremity 05/21/2013  . Benign fasciculation-cramp syndrome ([place]) 12/03/2012  . Vasovagal near syncope 11/02/2012  . Migraine with  aura 11/02/2012  . Upper airway cough syndrome 08/16/2012  . Chest tightness 09/21/2011  . Bunion of great toe 09/11/2011  . Routine health maintenance 09/11/2011  . Mild mitral regurgitation by prior echocardiogram 05/15/2011  . Chronic neck pain 03/07/2011  . DYSPLASTIC NEVUS, [place] 02/06/2010  . HAND PAIN, BILATERAL 08/25/2009  . AORTIC INSUFFICIENCY, MILD 08/02/2009  . Abnormal involuntary movements(781.0) 07/28/2009  . LUMBAR SPRAIN AND STRAIN 05/21/2009  . HEMORRHOIDS-INTERNAL 12/15/2008  . Constipation 12/15/2008  . Irritable bowel syndrome 12/15/2008  . RECTAL BLEEDING 12/15/2008  . RENAL CALCULUS, HX OF 11/29/2007  . Herpes simplex without mention of complication 04/19/1116  . GLOBUS HYSTERICUS 01/01/2007  . RHINITIS, ALLERGIC NOS 01/01/2007    Earlie Counts, PT 07/14/2015 8:51 AM   [facility] [address] [place], [place], [address] Phone: [phone]   Fax:  [phone]  Name: Laretha Luepke MRN: [id number] Date of Birth: Mar 24, 1951

## 2015-07-18 ENCOUNTER — Ambulatory Visit (INDEPENDENT_AMBULATORY_CARE_PROVIDER_SITE_OTHER): Payer: BLUE CROSS/BLUE SHIELD | Admitting: Internal Medicine

## 2015-07-18 ENCOUNTER — Ambulatory Visit: Payer: BLUE CROSS/BLUE SHIELD

## 2015-07-18 ENCOUNTER — Encounter: Payer: Self-pay | Admitting: Internal Medicine

## 2015-07-18 VITALS — BP 120/76 | HR 71 | Wt 103.0 lb

## 2015-07-18 DIAGNOSIS — M8580 Other specified disorders of bone density and structure, unspecified site: Secondary | ICD-10-CM | POA: Insufficient documentation

## 2015-07-18 DIAGNOSIS — H9202 Otalgia, left ear: Secondary | ICD-10-CM

## 2015-07-18 DIAGNOSIS — F411 Generalized anxiety disorder: Secondary | ICD-10-CM | POA: Diagnosis not present

## 2015-07-18 DIAGNOSIS — B001 Herpesviral vesicular dermatitis: Secondary | ICD-10-CM | POA: Diagnosis not present

## 2015-07-18 MED ORDER — ACYCLOVIR 400 MG PO TABS: 400.0000 mg | ORAL_TABLET | Freq: Three times a day (TID) | ORAL | Status: DC

## 2015-07-18 NOTE — Assessment & Plan Note (Signed)
1/17 lower gum L Acyclovir Aleve 1 bid

## 2015-07-18 NOTE — Assessment & Plan Note (Signed)
BDS Vit D 

## 2015-07-18 NOTE — Patient Instructions (Signed)
Aleve: 1 tab twice a day x 1 week with food

## 2015-07-18 NOTE — Assessment & Plan Note (Signed)
Doing well 

## 2015-07-18 NOTE — Progress Notes (Signed)
Subjective:  Patient ID: [name], female    DOB: Mar 01, 1951  Age: 65 y.o. MRN: [id number]  CC: No chief complaint on file.   HPI Megana Mesnard presents for L earache and around L ear. C/o sore on a gum L wisdom tooth area on the lower jaw. c/o L neck bump  Outpatient Prescriptions Prior to Visit  Medication Sig Dispense Refill  . ALREX 0.2 % SUSP Apply 0.2 drops to eye as needed (allergies).     [name] aspirin EC 81 MG tablet Take 1 tablet (81 mg total) by mouth daily. 100 tablet 3  . Botulinum Toxin Type A, Cosm, (BOTOX COSMETIC) 50 UNITS SOLR Inject 4-6 Units into the muscle. 3 month  injection    . conjugated estrogens (PREMARIN) vaginal cream INSERT 1 GRAM VAGINALLY ONCE A WEEK AS DIRECTED 30 g 3  . Cyanocobalamin (VITAMIN B-12 PO) Take 1 tablet by mouth daily.    . Diclofenac Sodium 2 % SOLN Apply twice daily. 112 g 1  . EPIPEN 2-PAK 0.3 MG/0.3ML SOAJ injection See admin instructions.  1  . fexofenadine (ALLEGRA ALLERGY CHILDRENS) 30 MG tablet Take 30 mg by mouth 2 (two) times daily as needed (allergies).    . fluocinonide cream (LIDEX) [id number] % Apply 1 application topically 2 (two) times daily. 90 g 2  . fluticasone (FLONASE) 50 MCG/ACT nasal spray Place 1 spray into both nostrils as needed for allergies.   5  . Hyaluronic Acid (RESTYLANE IJ) Inject as directed every 6 (six) months.    . hydrocortisone (ANUSOL-HC) 25 MG suppository Place 1 suppository (25 mg total) rectally at bedtime. X 7 days as needed for hemorrhoid bleeding. 12 suppository 2  . hydrOXYzine (ATARAX/VISTARIL) 10 MG tablet Take 1 tablet (10 mg total) by mouth 3 (three) times daily as needed. 30 tablet 0  . ibuprofen (ADVIL,MOTRIN) 200 MG tablet Take 400-600 mg by mouth as needed for headache or moderate pain.     [name] lidocaine (XYLOCAINE) 2 % solution Use as directed 5 mLs in the mouth or throat as needed for mouth pain. 100 mL 0  . Linaclotide (LINZESS) 145 MCG CAPS capsule Take 1 capsule (145 mcg total) by  mouth 1 day or 1 dose. 30 capsule 11  . Multiple Vitamins-Minerals (ICAPS AREDS 2) CAPS Take 1 capsule by mouth daily.    . ranitidine (ZANTAC) 150 MG tablet Take 1 tablet (150 mg total) by mouth 2 (two) times daily. 180 tablet 3  . tretinoin (RETIN-A) 0.05 % cream Apply 1 application topically at bedtime.    . triamcinolone cream (KENALOG) 0.1 % Apply 1 application topically 2 (two) times daily. 30 g 0   No facility-administered medications prior to visit.    ROS Review of Systems  Constitutional: Negative for chills, activity change, appetite change, fatigue and unexpected weight change.  HENT: Positive for ear pain and mouth sores. Negative for congestion and sinus pressure.   Eyes: Negative for visual disturbance.  Respiratory: Negative for cough and chest tightness.   Gastrointestinal: Negative for nausea and abdominal pain.  Genitourinary: Negative for frequency, difficulty urinating and vaginal pain.  Musculoskeletal: Negative for back pain and gait problem.  Skin: Negative for pallor and rash.  Neurological: Negative for dizziness, tremors, weakness, numbness and headaches.  Psychiatric/Behavioral: Negative for suicidal ideas, confusion and sleep disturbance.    Objective:  BP 120/76 mmHg  Pulse 71  Wt 103 lb (46.72 kg)  SpO2 98%  BP Readings from Last 3 Encounters:  07/18/15  120/76  07/11/15 114/74  07/11/15 111/62    Wt Readings from Last 3 Encounters:  07/18/15 103 lb (46.72 kg)  07/11/15 103 lb (46.72 kg)  06/17/15 102 lb (46.267 kg)    Physical Exam  Constitutional: She appears well-developed. No distress.  HENT:  Head: Normocephalic.  Right Ear: External ear normal.  Left Ear: External ear normal.  Nose: Nose normal.  Mouth/Throat: Oropharynx is clear and moist.  Eyes: Conjunctivae are normal. Pupils are equal, round, and reactive to light. Right eye exhibits no discharge. Left eye exhibits no discharge.  Neck: Normal [name] of motion. Neck supple. No  JVD present. No tracheal deviation present. No thyromegaly present.  Cardiovascular: Normal rate, regular rhythm and normal heart sounds.   Pulmonary/Chest: No stridor. No respiratory distress. She has no wheezes.  Abdominal: Soft. Bowel sounds are normal. She exhibits no distension and no mass. There is no tenderness. There is no rebound and no guarding.  Musculoskeletal: She exhibits no edema or tenderness.  Lymphadenopathy:    She has no cervical adenopathy.  Neurological: She displays normal reflexes. No cranial nerve deficit. She exhibits normal muscle tone. Coordination normal.  Skin: No rash noted. No erythema.  Psychiatric: She has a normal mood and affect. Her behavior is normal. Judgment and thought content normal.    Lab Results  Component Value Date   WBC 7.4 04/21/2015   HGB 13.4 04/21/2015   HCT 39.7 04/21/2015   PLT 207.0 04/21/2015   GLUCOSE 102* 04/18/2015   CHOL 173 03/16/2015   TRIG 66.0 03/16/2015   HDL 68.10 03/16/2015   LDLCALC 91 03/16/2015   ALT 18 04/18/2015   AST 20 04/18/2015   NA 138 04/18/2015   K 4.8 04/18/2015   CL 104 04/18/2015   CREATININE 0.79 04/18/2015   BUN 22* 04/18/2015   CO2 27 04/18/2015   TSH 1.78 09/24/2014    Dg Cervical Spine Complete  06/17/2015  CLINICAL DATA:  Chronic cervicalgia for approximately 5 years EXAM: CERVICAL SPINE - COMPLETE 4+ VIEW COMPARISON:  September 21, 2011 FINDINGS: Frontal, lateral, open-mouth odontoid, and bilateral oblique views were obtained. There is no fracture or spondylolisthesis. Prevertebral soft tissues and predental space regions are normal. There is moderate disc space narrowing at C4-5 and C5-6, not appreciably changed. No new disc space narrowing seen. There is exit foraminal narrowing due to facet hypertrophy at C3-4, C4-5, and C5-6 bilaterally as well as at C6-7 on the right. IMPRESSION: Multilevel arthropathy with slight increase in facet hypertrophy at several levels compared to prior study. Disc  space narrowing at C4-5 and C5-6 is stable. No fracture or spondylolisthesis. Electronically Signed   By: Lowella Grip III M.D.   On: 06/17/2015 09:45    Assessment & Plan:   There are no diagnoses linked to this encounter. I am having Ms. Doerner maintain her Botulinum Toxin Type A (Cosm), ibuprofen, ALREX, Hyaluronic Acid (RESTYLANE IJ), Diclofenac Sodium, fluticasone, tretinoin, hydrOXYzine, fluocinonide cream, EPIPEN 2-PAK, conjugated estrogens, triamcinolone cream, ranitidine, lidocaine, aspirin EC, Cyanocobalamin (VITAMIN B-12 PO), ICAPS AREDS 2, fexofenadine, Linaclotide, and hydrocortisone.  No orders of the defined types were placed in this encounter.     Follow-up: No Follow-up on file.  [name], MD

## 2015-07-18 NOTE — Assessment & Plan Note (Signed)
1/17 Likely due to gum ulcer/?H simplex Acyclovir Naproxen Rx Dental appt if needed in 1 wk

## 2015-07-18 NOTE — Progress Notes (Signed)
Pre visit review using our clinic review tool, if applicable. No additional management support is needed unless otherwise documented below in the visit note. 

## 2015-07-19 ENCOUNTER — Telehealth: Payer: Self-pay | Admitting: *Deleted

## 2015-07-19 NOTE — Telephone Encounter (Signed)
No suggestions - no limitations Thx

## 2015-07-19 NOTE — Telephone Encounter (Signed)
Pt called and left a vm Re: 07/18/15 OV with PCP. She wants to know if there is anything her spouse should do in relation to her recent diagnosis. Please advise.

## 2015-07-20 ENCOUNTER — Encounter: Payer: Self-pay | Admitting: Physical Therapy

## 2015-07-20 ENCOUNTER — Ambulatory Visit: Payer: BLUE CROSS/BLUE SHIELD | Attending: Family Medicine | Admitting: Physical Therapy

## 2015-07-20 DIAGNOSIS — R29898 Other symptoms and signs involving the musculoskeletal system: Secondary | ICD-10-CM

## 2015-07-20 DIAGNOSIS — M436 Torticollis: Secondary | ICD-10-CM | POA: Insufficient documentation

## 2015-07-20 DIAGNOSIS — M542 Cervicalgia: Secondary | ICD-10-CM | POA: Insufficient documentation

## 2015-07-20 NOTE — Therapy (Signed)
[facility] [address], [address] [place], [place], [address] Phone: [phone]   Fax:  [phone]  Physical Therapy Treatment  Patient Details  Name: [name] MRN: [id number] Date of Birth: October 12, 1950 Referring Provider: Dr. Antoine Primas  Encounter Date: 07/20/2015      PT End of Session - 07/20/15 0938    Visit Number 3   Date for PT Re-Evaluation 08/17/15   PT Start Time 0934   PT Stop Time 1030   PT Time Calculation (min) 56 min   Activity Tolerance Patient tolerated treatment well   Behavior During Therapy [facility] for tasks assessed/performed      Past Medical History  Diagnosis Date  . Mild aortic insufficiency   . Mild tricuspid regurgitation   . Internal hemorrhoids without mention of complication   . Unspecified constipation   . Irritable bowel syndrome   . Hemorrhage of rectum and anus   . Microscopic hematuria   . Personal history of urinary calculi   . Other plastic surgery for unacceptable cosmetic appearance     Inj tx fllers/expander  . Esophageal reflux   . Postmenopausal atrophic vaginitis   . Backache, unspecified   . Allergic rhinitis, cause unspecified   . Conversion disorder   . Calculus of kidney   . Migraine headache with aura   . Benign fasciculation-cramp syndrome (HCC) 12/03/2012     Worsening with  stress, fatigue .  [name] Osteopenia   . Premature atrial contractions   . Vasovagal syncope   . GERD (gastroesophageal reflux disease)   . PVC (premature ventricular contraction)     a. Event monitor 2013.  [name] Cotton wool spots     Past Surgical History  Procedure Laterality Date  . Cosmetic procedures w/injection therapy    . Wisdom tooth extraction    . Breast biopsy Right   . Skin cancer excision Left     thigh    There were no vitals filed for this visit.  Visit Diagnosis:  Stiffness of cervical spine  Cervical pain  Shoulder weakness      Subjective Assessment -  07/20/15 0938    Subjective Doing good today. Working on her pillow at home.   Currently in Pain? No/denies   Multiple Pain Sites No                         [facility] Adult PT Treatment/Exercise - 07/20/15 0001    Neck Exercises: Supine   Other Supine Exercise cervical release with orange ball   Manual Therapy   Soft tissue mobilization cervical Lt > Rt                  PT Short Term Goals - 07/20/15 1003    PT SHORT TERM GOAL #1   Title independent with initial HEP   Time 3   Period Weeks   Status Achieved           PT Long Term Goals - 07/06/15 1250    PT LONG TERM GOAL #2   Title return to exercise with correct body mechanics with minimal cervical pain   Time 6   Period Weeks   Status New   PT LONG TERM GOAL #3   Title pulling items with   minimal cervical pain   Time 6   Period Weeks   Status New   PT LONG TERM GOAL #4   Title lift items with minimal pain  due to shoulder strength 5/5   Time 6   Period Weeks   Status New               Plan - 07/20/15 1011    Clinical Impression Statement Pt reports she is feeling better in her neck, today she presented with no pain. Lt cervical muscles are more dense with small trigger points throughout the musculature. She tolerated deep tissue work. Introduced cervical release work that she might be able to do at home  on a daily basis.    Pt will benefit from skilled therapeutic intervention in order to improve on the following deficits Pain;Decreased mobility;Decreased strength;Increased muscle spasms;Decreased range of motion   Rehab Potential Excellent   Clinical Impairments Affecting Rehab Potential None   PT Frequency 2x / week   PT Duration 6 weeks   PT Treatment/Interventions Electrical Stimulation;Cryotherapy;Moist Heat;Therapeutic exercise;Therapeutic activities;Ultrasound;Neuromuscular re-education;Patient/family education;Manual techniques;Dry needling   PT Next Visit Plan See how she  felt after session, soft tissue work to cervical, work on home release methods        Problem List Patient Active Problem List   Diagnosis Date Noted  . Cold sore 07/18/2015  . Osteopenia 07/18/2015  . Earache on left 07/18/2015  . Cervical disc disorder with radiculopathy of cervical region 06/17/2015  . Cotton wool spots 03/15/2015  . Left LBP 03/08/2015  . Food poisoning 03/08/2015  . Generalized anxiety disorder 03/08/2015  . Dysuria 02/23/2015  . Well adult exam 09/09/2014  . Internal hemorrhoids 09/07/2014  . Allergic urticaria 08/20/2014  . Acute sinusitis 08/18/2014  . Neck muscle spasm 07/30/2014  . Grief 06/17/2014  . Mild aortic insufficiency   . Mild tricuspid regurgitation   . Vasovagal syncope   . Premature atrial contractions   . GERD (gastroesophageal reflux disease)   . PVC (premature ventricular contraction)   . Dense breast tissue 03/23/2014  . Microhematuria 03/22/2014  . Rash and nonspecific skin eruption 12/22/2013  . Chronic meniscal tear of knee 12/21/2013  . Benign fasciculations 12/11/2013  . Strain of adductor magnus muscle of left lower extremity 05/21/2013  . Benign fasciculation-cramp syndrome (HCC) 12/03/2012  . Vasovagal near syncope 11/02/2012  . Migraine with aura 11/02/2012  . Upper airway cough syndrome 08/16/2012  . Chest tightness 09/21/2011  . Bunion of great toe 09/11/2011  . Routine health maintenance 09/11/2011  . Mild mitral regurgitation by prior echocardiogram 05/15/2011  . Chronic neck pain 03/07/2011  . DYSPLASTIC NEVUS, FACE 02/06/2010  . HAND PAIN, BILATERAL 08/25/2009  . AORTIC INSUFFICIENCY, MILD 08/02/2009  . Abnormal involuntary movements(781.0) 07/28/2009  . LUMBAR SPRAIN AND STRAIN 05/21/2009  . HEMORRHOIDS-INTERNAL 12/15/2008  . Constipation 12/15/2008  . Irritable bowel syndrome 12/15/2008  . RECTAL BLEEDING 12/15/2008  . RENAL CALCULUS, HX OF 11/29/2007  . Herpes simplex without mention of complication  11/03/2007  . GLOBUS HYSTERICUS 01/01/2007  . RHINITIS, ALLERGIC NOS 01/01/2007    Donabelle Molden, PTA 07/20/2015, 10:16 AM  [facility] [address], [address] [place], [place], [address] Phone: [phone]   Fax:  [phone]  Name: Calyn Drymon MRN: [id number] Date of Birth: 11-22-1950

## 2015-07-20 NOTE — Telephone Encounter (Signed)
Pt informed

## 2015-07-21 ENCOUNTER — Ambulatory Visit (INDEPENDENT_AMBULATORY_CARE_PROVIDER_SITE_OTHER)
Admission: RE | Admit: 2015-07-21 | Discharge: 2015-07-21 | Disposition: A | Discharge status: Home / Self Care | Payer: BLUE CROSS/BLUE SHIELD | Source: Ambulatory Visit | Attending: Internal Medicine | Admitting: Internal Medicine

## 2015-07-21 DIAGNOSIS — M8580 Other specified disorders of bone density and structure, unspecified site: Secondary | ICD-10-CM

## 2015-07-22 ENCOUNTER — Ambulatory Visit: Payer: BLUE CROSS/BLUE SHIELD | Admitting: Physical Therapy

## 2015-07-25 ENCOUNTER — Ambulatory Visit: Payer: BLUE CROSS/BLUE SHIELD | Admitting: Physical Therapy

## 2015-07-27 ENCOUNTER — Encounter: Payer: Self-pay | Admitting: Physical Therapy

## 2015-07-27 ENCOUNTER — Ambulatory Visit: Payer: BLUE CROSS/BLUE SHIELD | Admitting: Physical Therapy

## 2015-07-27 DIAGNOSIS — M436 Torticollis: Secondary | ICD-10-CM

## 2015-07-27 DIAGNOSIS — R29898 Other symptoms and signs involving the musculoskeletal system: Secondary | ICD-10-CM

## 2015-07-27 DIAGNOSIS — M542 Cervicalgia: Secondary | ICD-10-CM

## 2015-07-27 NOTE — Patient Instructions (Signed)
Hook-Lying    Lie with hips and knees bent. Allow body's muscles to relax. Place hands on belly. Inhale slowly and deeply for _3__ seconds, so hands move up. Then take __3_ seconds to exhale. Repeat _5__ times. Do _1__ times a day.   Copyright  VHI. All rights reserved.  Sitting    Sit comfortably. Allow body's muscles to relax. Place hands on belly. Inhale slowly and deeply for __3_ seconds, so hands move out. Then take 3___ seconds to exhale. Repeat __5_ times. Do __1_ times a day.  Copyright  VHI. All rights reserved.   [facility] [address] [place], [place] [address] Phone # [phone] Fax [phone]

## 2015-07-27 NOTE — Therapy (Signed)
[facility] [address], [place], [place], [address] Phone: [phone]   Fax:  [phone]  Physical Therapy Treatment  Patient Details  Name: [name] MRN: [id number] Date of Birth: 11-13-1950 Referring Provider: Dr. Hulan Saas  Encounter Date: 07/27/2015      PT End of Session - 07/27/15 0858    Visit Number 4   Date for PT Re-Evaluation 08/17/15   PT Start Time 0853   PT Stop Time 0940   PT Time Calculation (min) 47 min   Activity Tolerance Patient tolerated treatment well   Behavior During Therapy [facility] for tasks assessed/performed      Past Medical History  Diagnosis Date  . Mild aortic insufficiency   . Mild tricuspid regurgitation   . Internal hemorrhoids without mention of complication   . Unspecified constipation   . Irritable bowel syndrome   . Hemorrhage of rectum and anus   . Microscopic hematuria   . Personal history of urinary calculi   . Other plastic surgery for unacceptable cosmetic appearance     Inj tx fllers/expander  . Esophageal reflux   . Postmenopausal atrophic vaginitis   . Backache, unspecified   . Allergic rhinitis, cause unspecified   . Conversion disorder   . Calculus of kidney   . Migraine headache with aura   . Benign fasciculation-cramp syndrome ([place]) 12/03/2012     Worsening with  stress, fatigue .  [name] Osteopenia   . Premature atrial contractions   . Vasovagal syncope   . GERD (gastroesophageal reflux disease)   . PVC (premature ventricular contraction)     a. Event monitor 2013.  [name] Cotton wool spots     Past Surgical History  Procedure Laterality Date  . Cosmetic procedures w/injection therapy    . Wisdom tooth extraction    . Breast biopsy Right   . Skin cancer excision Left     thigh    There were no vitals filed for this visit.  Visit Diagnosis:  Stiffness of cervical spine  Cervical pain  Shoulder weakness      Subjective Assessment -  07/27/15 0858    Subjective Doing good today. Working on her pillow at home.   Patient Stated Goals decrease pain   Currently in Pain? No/denies                         [facility] Adult PT Treatment/Exercise - 07/27/15 0001    Neck Exercises: Supine   Neck Retraction 10 reps;5 secs  head on red physioball   Shoulder Flexion Left;Right;10 reps  alternate; head press into red physioball   Other Supine Exercise cervical release( head side to side)  with orange mini basketballball   Other Supine Exercise cervical nodding 10x on red physioball 10x   Modalities   Modalities Moist Heat   Moist Heat Therapy   Number Minutes Moist Heat 15 Minutes   Moist Heat Location Cervical   Manual Therapy   Manual Therapy Soft tissue mobilization   Soft tissue mobilization cervical paraspinals, scalenes, anterior cervical facets                PT Education - 07/27/15 0927    Education provided Yes   Education Details diaphragmatic breathing   Person(s) Educated Patient   Methods Explanation;Demonstration;Verbal cues;Handout   Comprehension Returned demonstration;Verbalized understanding          PT Short Term Goals - 07/27/15 8341  PT SHORT TERM GOAL #1   Title independent with initial HEP   Time 3   Period Weeks   Status Achieved   PT SHORT TERM GOAL #2   Title pain with pulling items decreased >/= 25%   Time 3   Period Weeks   Status Achieved   PT SHORT TERM GOAL #3   Title understand correct body mechanics with work tasks to decrease strain on cervical   Time 3   Period Weeks   Status Achieved           PT Long Term Goals - 07/06/15 1250    PT LONG TERM GOAL #2   Title return to exercise with correct body mechanics with minimal cervical pain   Time 6   Period Weeks   Status New   PT LONG TERM GOAL #3   Title pulling items with   minimal cervical pain   Time 6   Period Weeks   Status New   PT LONG TERM GOAL #4   Title lift items with minimal  pain due to shoulder strength 5/5   Time 6   Period Weeks   Status New               Plan - 07/27/15 7782    Clinical Impression Statement Patient reports her pain is 98% better.  Patient has difficulty with diaphgramatic breathing and needs tactile and verbal cues to raise her abdomen.  Patient has met her STG's.  Patient has tenderness located in cervical musculature. Patient would benefit from skilled therapy to improve cervical paraspinals.    Pt will benefit from skilled therapeutic intervention in order to improve on the following deficits Pain;Decreased mobility;Decreased strength;Increased muscle spasms;Decreased range of motion   Rehab Potential Excellent   Clinical Impairments Affecting Rehab Potential None   PT Frequency 2x / week   PT Duration 6 weeks   PT Treatment/Interventions Electrical Stimulation;Cryotherapy;Moist Heat;Therapeutic exercise;Therapeutic activities;Ultrasound;Neuromuscular re-education;Patient/family education;Manual techniques;Dry needling   PT Next Visit Plan , soft tissue work to cervical, stabilization exercises, check cervical ROM; review bone density results   PT Home Exercise Plan progress as needed   Consulted and Agree with Plan of Care Patient        Problem List Patient Active Problem List   Diagnosis Date Noted  . Cold sore 07/18/2015  . Osteopenia 07/18/2015  . Earache on left 07/18/2015  . Cervical disc disorder with radiculopathy of cervical region 06/17/2015  . Cotton wool spots 03/15/2015  . Left LBP 03/08/2015  . Food poisoning 03/08/2015  . Generalized anxiety disorder 03/08/2015  . Dysuria 02/23/2015  . Well adult exam 09/09/2014  . Internal hemorrhoids 09/07/2014  . Allergic urticaria 08/20/2014  . Acute sinusitis 08/18/2014  . Neck muscle spasm 07/30/2014  . Grief 06/17/2014  . Mild aortic insufficiency   . Mild tricuspid regurgitation   . Vasovagal syncope   . Premature atrial contractions   . GERD  (gastroesophageal reflux disease)   . PVC (premature ventricular contraction)   . Dense breast tissue 03/23/2014  . Microhematuria 03/22/2014  . Rash and nonspecific skin eruption 12/22/2013  . Chronic meniscal tear of knee 12/21/2013  . Benign fasciculations 12/11/2013  . Strain of adductor magnus muscle of left lower extremity 05/21/2013  . Benign fasciculation-cramp syndrome ([place]) 12/03/2012  . Vasovagal near syncope 11/02/2012  . Migraine with aura 11/02/2012  . Upper airway cough syndrome 08/16/2012  . Chest tightness 09/21/2011  . Bunion of great toe 09/11/2011  .  Routine health maintenance 09/11/2011  . Mild mitral regurgitation by prior echocardiogram 05/15/2011  . Chronic neck pain 03/07/2011  . DYSPLASTIC NEVUS, [place] 02/06/2010  . HAND PAIN, BILATERAL 08/25/2009  . AORTIC INSUFFICIENCY, MILD 08/02/2009  . Abnormal involuntary movements(781.0) 07/28/2009  . LUMBAR SPRAIN AND STRAIN 05/21/2009  . HEMORRHOIDS-INTERNAL 12/15/2008  . Constipation 12/15/2008  . Irritable bowel syndrome 12/15/2008  . RECTAL BLEEDING 12/15/2008  . RENAL CALCULUS, HX OF 11/29/2007  . Herpes simplex without mention of complication 22/29/7989  . GLOBUS HYSTERICUS 01/01/2007  . RHINITIS, ALLERGIC NOS 01/01/2007    Earlie Counts, PT 07/27/2015 9:32 AM   [facility] [address], [place], [place], [address] Phone: [phone]   Fax:  [phone]  Name: Rosalin Buster MRN: [id number] Date of Birth: [birth year]

## 2015-07-29 ENCOUNTER — Telehealth: Payer: Self-pay | Admitting: Internal Medicine

## 2015-07-29 DIAGNOSIS — G500 Trigeminal neuralgia: Secondary | ICD-10-CM

## 2015-07-29 NOTE — Telephone Encounter (Signed)
I'm not sure. Would [name] like to see an ENT doctor? Thx

## 2015-07-29 NOTE — Telephone Encounter (Signed)
Patient called to follow up. advised no response. She stated that it is ok to respond via phone OR mychart

## 2015-07-29 NOTE — Telephone Encounter (Signed)
Pt seen on 1/30 with left side of face and ears and you thought maybe sore in mouth or gums. She followed up with the dentist and there was no issue with gums or teeth. She is still having pain and discomfort on left side of face and ears. Since its been over 2 weeks and still having pain, she is wondering if there are any kind of tests you may want to order to figure out what is going on. She can be reached at [phone]

## 2015-08-01 MED ORDER — GABAPENTIN 100 MG PO CAPS: 100.0000 mg | ORAL_CAPSULE | Freq: Every day | ORAL | Status: DC

## 2015-08-01 NOTE — Telephone Encounter (Signed)
It could be a post-herpetic trigeminal nerve neuralgia. I'll sch an appt w/a neurologist. Pls call in Neurontin for pain - try qhs Thx

## 2015-08-01 NOTE — Telephone Encounter (Signed)
Called patient. She stated that she did see the ENT already. He advised that he thought it was a tooth problem.   She then went to her orthodontist, who confirmed that this was not a problem with her teeth.  So she came back to [place] at that point. She states that she is open to seeing another ENT within the [facility] for  Second opinion, however.

## 2015-08-02 MED ORDER — GABAPENTIN 100 MG PO CAPS: 100.0000 mg | ORAL_CAPSULE | Freq: Every day | ORAL | Status: DC

## 2015-08-02 NOTE — Telephone Encounter (Signed)
Neurontin Rx sent in. See meds.

## 2015-08-02 NOTE — Telephone Encounter (Signed)
Spoke to patient. She advised that she has long since used dr [employer]. She prefers to see dr dohmeier if it is ok

## 2015-08-02 NOTE — Telephone Encounter (Signed)
Ill call her now  [name]- please call in RX

## 2015-08-03 ENCOUNTER — Telehealth: Payer: Self-pay | Admitting: Internal Medicine

## 2015-08-03 NOTE — Telephone Encounter (Signed)
Would like a call back.  States that you were working on getting her in for her current neurologist - Dr. Brett Fairy at [facility].  States Dr. Camila Li was to refer her to Dr. Posey Pronto.  But would like her current neurologist.

## 2015-08-04 ENCOUNTER — Ambulatory Visit: Payer: BLUE CROSS/BLUE SHIELD | Admitting: Physical Therapy

## 2015-08-04 NOTE — Telephone Encounter (Signed)
Patient requested to see dr dohmeier, but referral went through to [facility]. Spoke with helen in referrals and asked that we fwd this to [facility]. Will call patient to clarify

## 2015-08-08 ENCOUNTER — Ambulatory Visit: Payer: BLUE CROSS/BLUE SHIELD | Admitting: Physical Therapy

## 2015-08-08 ENCOUNTER — Encounter: Payer: Self-pay | Admitting: Physical Therapy

## 2015-08-08 DIAGNOSIS — M542 Cervicalgia: Secondary | ICD-10-CM

## 2015-08-08 DIAGNOSIS — M436 Torticollis: Secondary | ICD-10-CM

## 2015-08-08 DIAGNOSIS — R29898 Other symptoms and signs involving the musculoskeletal system: Secondary | ICD-10-CM

## 2015-08-08 NOTE — Therapy (Signed)
[facility] [address] [place], [place], [address] Phone: [phone]   Fax:  [phone]  Physical Therapy Treatment  Patient Details  Name: [name] MRN: [id number] Date of Birth: [birth year] Referring Provider: Dr. Hulan Saas  Encounter Date: 08/08/2015      PT End of Session - 08/08/15 0848    Visit Number 5   Date for PT Re-Evaluation 08/17/15   PT Start Time 0848   PT Stop Time 0928   PT Time Calculation (min) 40 min   Activity Tolerance Patient tolerated treatment well   Behavior During Therapy [facility] for tasks assessed/performed      Past Medical History  Diagnosis Date  . Mild aortic insufficiency   . Mild tricuspid regurgitation   . Internal hemorrhoids without mention of complication   . Unspecified constipation   . Irritable bowel syndrome   . Hemorrhage of rectum and anus   . Microscopic hematuria   . Personal history of urinary calculi   . Other plastic surgery for unacceptable cosmetic appearance     Inj tx fllers/expander  . Esophageal reflux   . Postmenopausal atrophic vaginitis   . Backache, unspecified   . Allergic rhinitis, cause unspecified   . Conversion disorder   . Calculus of kidney   . Migraine headache with aura   . Benign fasciculation-cramp syndrome ([place]) 12/03/2012     Worsening with  stress, fatigue .  [name] Osteopenia   . Premature atrial contractions   . Vasovagal syncope   . GERD (gastroesophageal reflux disease)   . PVC (premature ventricular contraction)     a. Event monitor 2013.  [name] Cotton wool spots     Past Surgical History  Procedure Laterality Date  . Cosmetic procedures w/injection therapy    . Wisdom tooth extraction    . Breast biopsy Right   . Skin cancer excision Left     thigh    There were no vitals filed for this visit.  Visit Diagnosis:  Stiffness of cervical spine  Cervical pain  Shoulder weakness      Subjective Assessment -  08/08/15 0850    Subjective My neck is feeling better. Patient reports her neck is 99% better.    Patient Stated Goals decrease pain   Currently in Pain? No/denies            [facility] PT Assessment - 08/08/15 0001    Assessment   Medical Diagnosis M54.2 Neck pain   Onset Date/Surgical Date 05/19/15   Hand Dominance Right   Prior Therapy yes   Precautions   Precautions Cervical   Precaution Comments no overhead lifting    Balance Screen   Has the patient fallen in the past 6 months No   Has the patient had a decrease in activity level because of a fear of falling?  No   Is the patient reluctant to leave their home because of a fear of falling?  No   Cognition   Overall Cognitive Status Within Functional Limits for tasks assessed   Observation/Other Assessments   Focus on Therapeutic Outcomes (FOTO)  51% limitation   AROM   AROM Assessment Site Cervical   Cervical Flexion full   Cervical Extension full   Cervical - Right Side Bend full   Cervical - Left Side Bend full   Cervical - Right Rotation full   Cervical - Left Rotation full   Strength   Overall Strength Comments bil. shoulder strength is 5/5  [place] [facility] PT Treatment/Exercise - 08/08/15 0001    Therapeutic Activites    Therapeutic Activities Work [employer];Other Therapeutic Activities   Work Simulation reviewed work station    Other Therapeutic Activities discussed patient wiht her gym porgram on what exericses are alright to do wtih decreased  strain on cervical, sleeping, lifting items with cervical neurtal.    Neck Exercises: Machines for Passenger transport manager for Strengthening lat bar stand 25# with verbal cues on cervical position    Neck Exercises: Standing   Other Standing Exercises lateral raise 3# bil. 10x; upright rows 5x 3# bil. ;    Other Standing Exercises trice  ex with cervcial neutral.    Neck Exercises: Seated   Other Seated Exercise overhead press with 3#  wt. 15x no pain   Neck Exercises: Prone   Other Prone Exercise Plank with verbal cues on cervical psoition   Modalities   Modalities Moist Heat   Moist Heat Therapy   Number Minutes Moist Heat 15 Minutes   Moist Heat Location Cervical                  PT Short Term Goals - 07/27/15 0859    PT SHORT TERM GOAL #1   Title independent with initial HEP   Time 3   Period Weeks   Status Achieved   PT SHORT TERM GOAL #2   Title pain with pulling items decreased >/= 25%   Time 3   Period Weeks   Status Achieved   PT SHORT TERM GOAL #3   Title understand correct body mechanics with work tasks to decrease strain on cervical   Time 3   Period Weeks   Status Achieved           PT Long Term Goals - 08/08/15 0854    PT LONG TERM GOAL #1   Title independent with HEP   Time 6   Period Weeks   Status On-going   PT LONG TERM GOAL #2   Title return to exercise with correct body mechanics with minimal cervical pain   Time 6   Period Weeks   Status Achieved   PT LONG TERM GOAL #3   Title pulling items with   minimal cervical pain   Time 6   Period Weeks   Status Achieved   PT LONG TERM GOAL #4   Title lift items with minimal pain due to shoulder strength 5/5   Time 6   Period Weeks   Status Achieved               Plan - 08/08/15 0926    Clinical Impression Statement Patient is 99% better.  Paitent has full cervical ROM.  Paitent has been instructed on how to exercise at the gym correctly to decrease strain on cervical.  Patient bil. shoulder strength is 5/5.  Patient has met STG and LTG.  Patient has tried lat bar and overhead press in PT with no difficult and guidelines of how to perform correctly.   Pt will benefit from skilled therapeutic intervention in order to improve on the following deficits Pain;Decreased mobility;Decreased strength;Increased muscle spasms;Decreased range of motion   Rehab Potential Excellent   Clinical Impairments Affecting Rehab  Potential None   PT Treatment/Interventions Electrical Stimulation;Cryotherapy;Moist Heat;Therapeutic exercise;Therapeutic activities;Ultrasound;Neuromuscular re-education;Patient/family education;Manual techniques;Dry needling   PT Next Visit Plan Discharge   PT Home Exercise Plan current HEP   Recommended Other Services None   Consulted and Agree with Plan  of Care Patient        Problem List Patient Active Problem List   Diagnosis Date Noted  . Cold sore 07/18/2015  . Osteopenia 07/18/2015  . Earache on left 07/18/2015  . Cervical disc disorder with radiculopathy of cervical region 06/17/2015  . Cotton wool spots 03/15/2015  . Left LBP 03/08/2015  . Food poisoning 03/08/2015  . Generalized anxiety disorder 03/08/2015  . Dysuria 02/23/2015  . Well adult exam 09/09/2014  . Internal hemorrhoids 09/07/2014  . Allergic urticaria 08/20/2014  . Acute sinusitis 08/18/2014  . Neck muscle spasm 07/30/2014  . Grief 06/17/2014  . Mild aortic insufficiency   . Mild tricuspid regurgitation   . Vasovagal syncope   . Premature atrial contractions   . GERD (gastroesophageal reflux disease)   . PVC (premature ventricular contraction)   . Dense breast tissue 03/23/2014  . Microhematuria 03/22/2014  . Rash and nonspecific skin eruption 12/22/2013  . Chronic meniscal tear of knee 12/21/2013  . Benign fasciculations 12/11/2013  . Strain of adductor magnus muscle of left lower extremity 05/21/2013  . Benign fasciculation-cramp syndrome ([place]) 12/03/2012  . Vasovagal near syncope 11/02/2012  . Migraine with aura 11/02/2012  . Upper airway cough syndrome 08/16/2012  . Chest tightness 09/21/2011  . Bunion of great toe 09/11/2011  . Routine health maintenance 09/11/2011  . Mild mitral regurgitation by prior echocardiogram 05/15/2011  . Chronic neck pain 03/07/2011  . DYSPLASTIC NEVUS, [place] 02/06/2010  . HAND PAIN, BILATERAL 08/25/2009  . AORTIC INSUFFICIENCY, MILD 08/02/2009  . Abnormal  involuntary movements(781.0) 07/28/2009  . LUMBAR SPRAIN AND STRAIN 05/21/2009  . HEMORRHOIDS-INTERNAL 12/15/2008  . Constipation 12/15/2008  . Irritable bowel syndrome 12/15/2008  . RECTAL BLEEDING 12/15/2008  . RENAL CALCULUS, HX OF 11/29/2007  . Herpes simplex without mention of complication 20/91/9802  . GLOBUS HYSTERICUS 01/01/2007  . RHINITIS, ALLERGIC NOS 01/01/2007    Earlie Counts, PT 08/08/2015 9:30 AM   [facility] [address] [place], [place], [address] Phone: [phone]   Fax:  [phone]  Name: [name] MRN: [id number] Date of Birth: July 09, 1950   PHYSICAL THERAPY DISCHARGE SUMMARY  Visits from Start of Care: 5  Current functional level related to goals / functional outcomes: See above.    Remaining deficits: See above.   Education / Equipment: HEP  Plan: Patient agrees to discharge.  Patient goals were met. Patient is being discharged due to meeting the stated rehab goals.  Thank you for the referral. Earlie Counts, PT 08/08/2015 9:30 AM  ?????

## 2015-08-15 ENCOUNTER — Ambulatory Visit: Payer: BLUE CROSS/BLUE SHIELD | Admitting: Family Medicine

## 2015-08-16 ENCOUNTER — Ambulatory Visit: Payer: BLUE CROSS/BLUE SHIELD | Admitting: Family Medicine

## 2015-08-23 ENCOUNTER — Ambulatory Visit: Payer: BLUE CROSS/BLUE SHIELD | Admitting: Neurology

## 2015-08-24 ENCOUNTER — Ambulatory Visit (INDEPENDENT_AMBULATORY_CARE_PROVIDER_SITE_OTHER): Payer: BLUE CROSS/BLUE SHIELD | Admitting: Family Medicine

## 2015-08-24 ENCOUNTER — Encounter: Payer: Self-pay | Admitting: Family Medicine

## 2015-08-24 VITALS — BP 112/72 | HR 83 | Ht 61.5 in | Wt 103.0 lb

## 2015-08-24 DIAGNOSIS — M5010 Cervical disc disorder with radiculopathy, unspecified cervical region: Secondary | ICD-10-CM | POA: Diagnosis not present

## 2015-08-24 NOTE — Progress Notes (Signed)
  [name] [address] [place], [place] [address] Phone: [phone] Subjective:    I'm seeing this patient by the request  of:  [name], MD   CC: Neck pain follow-up  [id number] [name] is a 65 y.o. female coming in with complaint of neck pain. Patient was found to have degenerative disc disease at multiple levels of the cervical spine. Patient will had symptoms ever concerning for cervical radiculopathy. Patient was to start working with formal physical therapy and has constant significant more regularly recently. Patient states that this is helped a significant limp. States that she is having very minimal pain. No longer doing the lifting at work. Patient is working out on a regular basis. Patient is finish with formal physical therapy. States that she is about 80% better. Happy with the results. Able to do all daily activities without any pain and not waking up at night.    Past medical history, social, surgical and family history all reviewed in electronic medical record.   Review of Systems: No headache, visual changes, nausea, vomiting, diarrhea, constipation, dizziness, abdominal pain, skin rash, fevers, chills, night sweats, weight loss, swollen lymph nodes, body aches, joint swelling, muscle aches, chest pain, shortness of breath, mood changes.   Objective Blood pressure 112/72, pulse 83, height 5' 1.5" (1.562 m), weight 103 lb (46.72 kg), SpO2 97 %.  General: No apparent distress alert and oriented x3 mood and affect normal, dressed appropriately.  HEENT: Pupils equal, extraocular movements intact  Respiratory: Patient's speak in full sentences and does not appear short of breath  Cardiovascular: No lower extremity edema, non tender, no erythema  Skin: Warm dry intact with no signs of infection or rash on extremities or on axial skeleton.  Abdomen: Patient does have involuntary guarding of the left lower quadrant as well as  the epigastric region. No rebound tenderness noted. No masses palpated. Neuro: Cranial nerves II through XII are intact, neurovascularly intact in all extremities with 2+ DTRs and 2+ pulses.  Lymph: No lymphadenopathy of posterior or anterior cervical chain or axillae bilaterally.  Gait normal with good balance and coordination.  MSK:  Non tender with full range of motion and good stability and symmetric strength and tone of shoulders, elbows, wrist, hip, knee and ankles bilaterally.  Neck: Inspection unremarkable. No palpable stepoffs. Negative Spurling's today Lacks last 5 of right-sided rotation and left-sided side bending Grip strength and sensation normal in bilateral hands Strength good C4 to T1 distribution No sensory change to C4 to T1 Negative Hoffman sign bilaterally Reflexes normal    Impression and Recommendations:     This case required medical decision making of moderate complexity.

## 2015-08-24 NOTE — Patient Instructions (Signed)
Good to see you  Ice is your friend when needed and if you like the heat keep it up Continue with the ergonomics Calcium 600mg  daily  Vitamin D 2000 IU daily  See me again when you need me.

## 2015-08-24 NOTE — Progress Notes (Signed)
Pre visit review using our clinic review tool, if applicable. No additional management support is needed unless otherwise documented below in the visit note. 

## 2015-08-24 NOTE — Assessment & Plan Note (Signed)
Patient has responded well to the conservative therapy at this time. I do not think that any other treatment is necessary at this time. Encourage patient to continue conservative therapy. Patient will follow-up and see me again on an as-needed basis.

## 2015-09-09 ENCOUNTER — Ambulatory Visit (INDEPENDENT_AMBULATORY_CARE_PROVIDER_SITE_OTHER): Payer: BLUE CROSS/BLUE SHIELD | Admitting: Cardiovascular Disease

## 2015-09-09 ENCOUNTER — Encounter: Payer: Self-pay | Admitting: Cardiovascular Disease

## 2015-09-09 VITALS — BP 102/70 | HR 65 | Ht 61.5 in | Wt 103.8 lb

## 2015-09-09 DIAGNOSIS — I493 Ventricular premature depolarization: Secondary | ICD-10-CM | POA: Diagnosis not present

## 2015-09-09 DIAGNOSIS — I351 Nonrheumatic aortic (valve) insufficiency: Secondary | ICD-10-CM | POA: Diagnosis not present

## 2015-09-09 NOTE — Patient Instructions (Signed)
Medication Instructions:  Your physician recommends that you continue on your current medications as directed. Please refer to the Current Medication list given to you today.   Labwork: none  Testing/Procedures: Your physician has requested that you have an echocardiogram. Echocardiography is a painless test that uses sound waves to create images of your heart. It provides your doctor with information about the size and shape of your heart and how well your heart's chambers and valves are working. This procedure takes approximately one hour. There are no restrictions for this procedure. To be done in 12 months.  Week or so prior to appt with Dr. Angelena Form    Follow-Up: Your physician wants you to follow-up in: 12 months.  You will receive a reminder letter in the mail two months in advance. If you don't receive a letter, please call our office to schedule the follow-up appointment.   Any Other Special Instructions Will Be Listed Below (If Applicable).     If you need a refill on your cardiac medications before your next appointment, please call your pharmacy.

## 2015-09-09 NOTE — Progress Notes (Signed)
Chief Complaint  Patient presents with  . Follow-up     History of Present Illness: 65 yo female with history of premature atrial contractions, palpitations, vasovagal syncope, mild aortic valve insufficiency, mild mitral regurgitation, IBS, GERD here today for cardiac follow up. She has been followed in the past by Dr. Verl Blalock. Echo 09/07/13 with with mild AI, mild MR, normal LV function. Exercise stress test 05/07/14 with good exercise tolerance, no ischemic EKG changes and no chest pain with exercise.   She is here for follow up. She tells me today that she has been doing well. She is very active. She works out every day. No chest pain or SOB. Occasional skipped beats and palpitations. Last for a few seconds.   Primary Care Physician: Plotnikov  Past Medical History  Diagnosis Date  . Mild aortic insufficiency   . Mild tricuspid regurgitation   . Internal hemorrhoids without mention of complication   . Unspecified constipation   . Irritable bowel syndrome   . Hemorrhage of rectum and anus   . Microscopic hematuria   . Personal history of urinary calculi   . Other plastic surgery for unacceptable cosmetic appearance     Inj tx fllers/expander  . Esophageal reflux   . Postmenopausal atrophic vaginitis   . Backache, unspecified   . Allergic rhinitis, cause unspecified   . Conversion disorder   . Calculus of kidney   . Migraine headache with aura   . Benign fasciculation-cramp syndrome ([place]) 12/03/2012     Worsening with  stress, fatigue .  [name] Osteopenia   . Premature atrial contractions   . Vasovagal syncope   . GERD (gastroesophageal reflux disease)   . PVC (premature ventricular contraction)     a. Event monitor 2013.  [name] Cotton wool spots     Past Surgical History  Procedure Laterality Date  . Cosmetic procedures w/injection therapy    . Wisdom tooth extraction    . Breast biopsy Right   . Skin cancer excision Left     thigh    Current Outpatient Prescriptions    Medication Sig Dispense Refill  . ALREX 0.2 % SUSP Apply 0.2 drops to eye as needed (allergies).     . Botulinum Toxin Type A, Cosm, (BOTOX COSMETIC) 50 UNITS SOLR Inject 4-6 Units into the muscle. 3 month  injection    . conjugated estrogens (PREMARIN) vaginal cream INSERT 1 GRAM VAGINALLY ONCE A WEEK AS DIRECTED 30 g 3  . Cyanocobalamin (VITAMIN B-12 PO) Take 1 tablet by mouth daily.    [name] EPIPEN 2-PAK 0.3 MG/0.3ML SOAJ injection See admin instructions.  1  . fexofenadine (ALLEGRA ALLERGY CHILDRENS) 30 MG tablet Take 30 mg by mouth 2 (two) times daily as needed (allergies).    . fluocinonide cream (LIDEX) [id number] % Apply 1 application topically 2 (two) times daily. 90 g 2  . fluticasone (FLONASE) 50 MCG/ACT nasal spray Place 1 spray into both nostrils as needed for allergies.   5  . Multiple Vitamins-Minerals (ICAPS AREDS 2) CAPS Take 1 capsule by mouth daily.    . ranitidine (ZANTAC) 150 MG tablet Take 1 tablet (150 mg total) by mouth 2 (two) times daily. 180 tablet 3  . tretinoin (RETIN-A) 0.05 % cream Apply 1 application topically at bedtime.    . triamcinolone cream (KENALOG) 0.1 % Apply 1 application topically 2 (two) times daily. 30 g 0  . Hyaluronic Acid (RESTYLANE IJ) Inject as directed every 6 (six) months.    [name]  hydrOXYzine (ATARAX/VISTARIL) 10 MG tablet Take 1 tablet (10 mg total) by mouth 3 (three) times daily as needed. (Patient not taking: Reported on 09/09/2015) 30 tablet 0   No current facility-administered medications for this visit.    Allergies  Allergen Reactions  . Codeine Other (See Comments)    Doesn't remeber  . Epinephrine     Heart racing   . Neomycin     Topical rash from cream  . Penicillins Hives and Other (See Comments)    Has patient had a PCN reaction causing immediate rash, facial/tongue/throat swelling, SOB or lightheadedness with hypotension: Yes Has patient had a PCN reaction causing severe rash involving mucus membranes or skin necrosis: Yes Has  patient had a PCN reaction that required hospitalization No Has patient had a PCN reaction occurring within the last 10 years: No If all of the above answers are "NO", then may proceed with Cephalosporin use.   . Sulfonamide Derivatives Other (See Comments)    Doesn't remember   . Zithromax [Azithromycin] Rash    Social History   Social History  . Marital Status: Married    Spouse Name: [name]  . Number of Children: 0  . Years of Education: 14   Occupational History  . OFFICE liasion   . [employer]   . [employer]    Social History Main Topics  . Smoking status: Never Smoker   . Smokeless tobacco: Never Used  . Alcohol Use: No  . Drug Use: No  . Sexual Activity: Not on file   Other Topics Concern  . Not on file   Social History Narrative   [employer]  in [place]. Married '90 - marriage is in very good shape '12, No children.  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 Offerred medical services for them if needed (Nov '11)   Patient is married [name]) and lives at home with her husband.   Patient is working Administrator, arts work.   Patient has a high school education.   Patient is right-handed.   Patient does not drink any caffeine.          Family History  Problem Relation Age of Onset  . Hyperlipidemia Father   . COPD Father     Smoker, deceased Jun 02, 2014  . Hyperlipidemia Mother   . Diabetes Neg Hx   . Colon cancer Neg Hx   . Breast cancer Neg Hx   . Coronary artery disease Neg Hx   . Cancer Neg Hx     breast or colon  . Hypertension Father   . Hypertension Mother     Review of Systems:  As stated in the HPI and otherwise negative.   BP 102/70 mmHg  Pulse 65  Ht 5' 1.5" (1.562 m)  Wt 103 lb 12.8 oz (47.083 kg)  BMI 19.30 kg/m2  SpO2 98%  Physical Examination: General: Well developed, well nourished, NAD HEENT: OP clear, mucus membranes moist SKIN:  warm, dry. No rashes. Neuro: No focal deficits Musculoskeletal: Muscle strength 5/5 all ext Psychiatric: Mood and affect normal Neck: No JVD, no carotid bruits, no thyromegaly, no lymphadenopathy. Lungs:Clear bilaterally, no wheezes, rhonci, crackles Cardiovascular: Regular rate and rhythm. No murmurs, gallops or rubs. Abdomen:Soft. Bowel sounds present. Non-tender.  Extremities: No lower extremity edema. Pulses are 2 + in the bilateral DP/PT.  Echo 09/07/13: Left ventricle: The cavity size was normal. Systolic function was vigorous. The  estimated ejection fraction was in the range of 65% to 70%. Wall motion was normal; there were no regional wall motion abnormalities. The pulmonary vein flow pattern was normal. Left ventricular diastolic function parameters were normal. There was no evidence of elevated ventricular filling pressure by Doppler parameters. - Aortic valve: Mild regurgitation. - Mitral valve: Structurally normal valve. No regurgitation. - Left atrium: The atrium was normal in size. - Right ventricle: Systolic function was normal. - Tricuspid valve: Mild regurgitation. - Pulmonic valve: Structurally normal valve. No regurgitation. - Pulmonary arteries: Systolic pressure was within the normal range. - Inferior vena cava: The vessel was normal in size. - Pericardium, extracardiac: There was no pericardial effusion.  EKG:  EKG is ordered today. The ekg ordered today demonstrates NSR, rate 65 bpm.   Recent Labs: 09/24/2014: Magnesium 2.1; TSH 1.78 04/18/2015: ALT 18; BUN 22*; Creatinine, Ser 0.79; Potassium 4.8; Sodium 138 04/21/2015: Hemoglobin 13.4; Platelets 207.0   Lipid Panel    Component Value Date/Time   CHOL 173 03/16/2015 0759   TRIG 66.0 03/16/2015 0759   TRIG 66 06/25/2006 0741   HDL 68.10 03/16/2015 0759   CHOLHDL 3 03/16/2015 0759   CHOLHDL 2.4 CALC 06/25/2006 0741   VLDL 13.2 03/16/2015 0759   LDLCALC 91 03/16/2015 0759      Wt Readings from Last 3 Encounters:  09/09/15 103 lb 12.8 oz (47.083 kg)  08/24/15 103 lb (46.72 kg)  07/18/15 103 lb (46.72 kg)     Other studies Reviewed: Additional studies/ records that were reviewed today include:  Review of the above records demonstrates:    Assessment and Plan:   1. Palpitations: She is known to have PACs/PVCs. Event monitor 2013 with PVCs. No change recently. Rare palpitations. Avoid stimulants.   2. Aortic valve insufficiency: Mild by echo 2015. Repeat echo March 2018.   Current medicines are reviewed at length with the patient today.  The patient does not have concerns regarding medicines.  The following changes have been made:  no change  Labs/ tests ordered today include:   Orders Placed This Encounter  Procedures  . EKG 12-Lead  . Echocardiogram     Disposition:   FU with me in 12  months   Signed, Lauree Chandler, MD 09/09/2015 9:34 AM    [facility] [place], [place], [place]  [address] Phone: [phone]; Fax: [phone]

## 2015-09-12 ENCOUNTER — Encounter: Payer: Self-pay | Admitting: Internal Medicine

## 2015-09-12 ENCOUNTER — Other Ambulatory Visit (HOSPITAL_COMMUNITY)
Admission: RE | Admit: 2015-09-12 | Discharge: 2015-09-12 | Disposition: A | Discharge status: Home / Self Care | Payer: BLUE CROSS/BLUE SHIELD | Source: Ambulatory Visit | Attending: Internal Medicine | Admitting: Internal Medicine

## 2015-09-12 ENCOUNTER — Ambulatory Visit (INDEPENDENT_AMBULATORY_CARE_PROVIDER_SITE_OTHER): Payer: BLUE CROSS/BLUE SHIELD | Admitting: Internal Medicine

## 2015-09-12 VITALS — BP 120/68 | HR 68 | Ht 62.0 in | Wt 104.0 lb

## 2015-09-12 DIAGNOSIS — Z124 Encounter for screening for malignant neoplasm of cervix: Secondary | ICD-10-CM

## 2015-09-12 DIAGNOSIS — Z0000 Encounter for general adult medical examination without abnormal findings: Secondary | ICD-10-CM | POA: Diagnosis not present

## 2015-09-12 DIAGNOSIS — Z01419 Encounter for gynecological examination (general) (routine) without abnormal findings: Secondary | ICD-10-CM | POA: Diagnosis not present

## 2015-09-12 MED ORDER — ESTROGENS, CONJUGATED 0.625 MG/GM VA CREA: TOPICAL_CREAM | VAGINAL | Status: DC

## 2015-09-12 MED ORDER — RANITIDINE HCL 150 MG PO TABS: 150.0000 mg | ORAL_TABLET | Freq: Every day | ORAL | Status: DC

## 2015-09-12 NOTE — Assessment & Plan Note (Signed)
We discussed age appropriate health related issues, including available/recomended screening tests and vaccinations. We discussed a need for adhering to healthy diet and exercise. Labs/EKG were reviewed/ordered. All questions were answered. Mammo q 12 mo Eye appt q 12 mo Pt declined tetanus and Zostavax  PAP

## 2015-09-12 NOTE — Progress Notes (Signed)
Subjective:  Patient ID: [name], female    DOB: 08-13-1950  Age: 65 y.o. MRN: [id number]  CC: Annual Exam   HPI [name] presents for a well exam w/PAP LMP at 50  Outpatient Prescriptions Prior to Visit  Medication Sig Dispense Refill  . ALREX 0.2 % SUSP Apply 0.2 drops to eye as needed (allergies).     . Botulinum Toxin Type A, Cosm, (BOTOX COSMETIC) 50 UNITS SOLR Inject 4-6 Units into the muscle. 3 month  injection    . Cyanocobalamin (VITAMIN B-12 PO) Take 1 tablet by mouth daily.    [name] EPIPEN 2-PAK 0.3 MG/0.3ML SOAJ injection See admin instructions.  1  . fexofenadine (ALLEGRA ALLERGY CHILDRENS) 30 MG tablet Take 30 mg by mouth 2 (two) times daily as needed (allergies).    . fluocinonide cream (LIDEX) AB-123456789 % Apply 1 application topically 2 (two) times daily. 90 g 2  . fluticasone (FLONASE) 50 MCG/ACT nasal spray Place 1 spray into both nostrils as needed for allergies.   5  . Hyaluronic Acid (RESTYLANE IJ) Inject as directed every 6 (six) months.    . hydrOXYzine (ATARAX/VISTARIL) 10 MG tablet Take 1 tablet (10 mg total) by mouth 3 (three) times daily as needed. 30 tablet 0  . Multiple Vitamins-Minerals (ICAPS AREDS 2) CAPS Take 1 capsule by mouth daily.    [name] tretinoin (RETIN-A) 0.05 % cream Apply 1 application topically at bedtime.    . triamcinolone cream (KENALOG) 0.1 % Apply 1 application topically 2 (two) times daily. 30 g 0  . conjugated estrogens (PREMARIN) vaginal cream INSERT 1 GRAM VAGINALLY ONCE A WEEK AS DIRECTED 30 g 3  . ranitidine (ZANTAC) 150 MG tablet Take 1 tablet (150 mg total) by mouth 2 (two) times daily. 180 tablet 3   No facility-administered medications prior to visit.    ROS Review of Systems  Constitutional: Negative for chills, activity change, appetite change, fatigue and unexpected weight change.  HENT: Negative for congestion, mouth sores and sinus pressure.   Eyes: Negative for visual disturbance.  Respiratory: Negative for  cough and chest tightness.   Gastrointestinal: Negative for nausea, vomiting and abdominal pain.  Genitourinary: Negative for frequency, difficulty urinating and vaginal pain.  Musculoskeletal: Negative for back pain, joint swelling, gait problem and neck stiffness.  Skin: Negative for pallor and rash.  Neurological: Negative for dizziness, tremors, weakness, numbness and headaches.  Psychiatric/Behavioral: Negative for suicidal ideas, confusion, sleep disturbance and dysphoric mood. The patient is not nervous/anxious.     Objective:  BP 120/68 mmHg  Pulse 68  Ht 5\' 2"  (1.575 m)  Wt 104 lb (47.174 kg)  BMI 19.02 kg/m2  SpO2 98%  BP Readings from Last 3 Encounters:  09/13/15 120/68  09/12/15 120/68  09/09/15 102/70    Wt Readings from Last 3 Encounters:  09/13/15 99 lb 8 oz (45.133 kg)  09/12/15 104 lb (47.174 kg)  09/09/15 103 lb 12.8 oz (47.083 kg)    Physical Exam  Constitutional: She appears well-developed. No distress.  HENT:  Head: Normocephalic.  Right Ear: External ear normal.  Left Ear: External ear normal.  Nose: Nose normal.  Mouth/Throat: Oropharynx is clear and moist.  Eyes: Conjunctivae are normal. Pupils are equal, round, and reactive to light. Right eye exhibits no discharge. Left eye exhibits no discharge.  Neck: Normal [name] of motion. Neck supple. No JVD present. No tracheal deviation present. No thyromegaly present.  Cardiovascular: Normal rate, regular rhythm and normal heart sounds.  Pulmonary/Chest: No stridor. No respiratory distress. She has no wheezes.  Abdominal: Soft. Bowel sounds are normal. She exhibits no distension and no mass. There is no tenderness. There is no rebound and no guarding.  Genitourinary: Vagina normal and uterus normal. Guaiac negative stool. No vaginal discharge found.  Musculoskeletal: She exhibits no edema or tenderness.  Lymphadenopathy:    She has no cervical adenopathy.  Neurological: She displays normal reflexes. No  cranial nerve deficit. She exhibits normal muscle tone. Coordination normal.  Skin: No rash noted. No erythema.  Psychiatric: She has a normal mood and affect. Her behavior is normal. Judgment and thought content normal.  mild fibrocystic changes B breasts PAP obtained  Lab Results  Component Value Date   WBC 7.4 04/21/2015   HGB 13.4 04/21/2015   HCT 39.7 04/21/2015   PLT 207.0 04/21/2015   GLUCOSE 102* 04/18/2015   CHOL 173 03/16/2015   TRIG 66.0 03/16/2015   HDL 68.10 03/16/2015   LDLCALC 91 03/16/2015   ALT 18 04/18/2015   AST 20 04/18/2015   NA 138 04/18/2015   K 4.8 04/18/2015   CL 104 04/18/2015   CREATININE 0.79 04/18/2015   BUN 22* 04/18/2015   CO2 27 04/18/2015   TSH 1.78 09/24/2014    Dg Bone Density  07/21/2015  Date of study: 07/21/2015 Exam: DUAL X-RAY ABSORPTIOMETRY (DXA) FOR BONE MINERAL DENSITY (BMD) Instrument: [employer] Requesting Provider: PCP Indication: follow up for low BMD Comparison: 12/31/2008 Clinical data: Pt is a postmenopausal 65 y.o. female with previous h/o finger fracture. On calcium and vitamin D. Results:  Lumbar spine (L1-L4) Femoral neck (FN) T-score  -1.2  RFN: -1.9 LFN: -2.2  Change in BMD from previous DXA test (%) n/a  -5.8%* (*) statistically significant Assessment: the BMD is low according to the [facility] classification for osteoporosis (see below). Fracture risk: moderate FRAX score: 10 year major osteoporotic risk: 15.9 %. 10-year hip fracture risk: 2.8 %. These are under the thresholds for treatment of 20% and 3%, respectively. Comments: the technical quality of the study is good. Evaluation for secondary causes should be considered if clinically indicated. Recommend optimizing calcium (1200 mg/day) and vitamin D (800 IU/day) intake. Followup: Repeat BMD is appropriate after 2 years. WHO criteria for diagnosis of osteoporosis in postmenopausal women and in men 69 y/o or older: - normal: T-score -1.0 to + 1.0 - osteopenia/low bone density:  T-score between -2.5 and -1.0 - osteoporosis: T-score below -2.5 - severe osteoporosis: T-score below -2.5 with history of fragility fracture Note: although not part of the WHO classification, the presence of a fragility fracture, regardless of the T-score, should be considered diagnostic of osteoporosis, provided other causes for the fracture have been excluded. Treatment: The National Osteoporosis Foundation recommends that treatment be considered in postmenopausal women and men age 76 or older with: 1. Hip or vertebral (clinical or morphometric) fracture 2. T-score of - 2.5 or lower at the spine or hip 3. 10-year fracture probability by FRAX of at least 20% for a major osteoporotic fracture and 3% for a hip fracture Philemon Kingdom, MD [facility]    Assessment & Plan:   [name] was seen today for annual exam.  Diagnoses and all orders for this visit:  Well adult exam -     Hepatic function panel; Future -     CBC with Differential/Platelet; Future -     Basic metabolic panel; Future -     Hepatitis C antibody; Future -  Lipid panel; Future -     TSH; Future -     Urinalysis; Future -     Cytology - PAP  Pap smear for cervical cancer screening -     Cancel: Cytology - PAP; Future -     Cytology - PAP  Other orders -     conjugated estrogens (PREMARIN) vaginal cream; INSERT 1 GRAM VAGINALLY ONCE A WEEK AS DIRECTED -     ranitidine (ZANTAC) 150 MG tablet; Take 1 tablet (150 mg total) by mouth daily.  I have changed [name]'s ranitidine. I am also having her maintain her Botulinum Toxin Type A (Cosm), ALREX, Hyaluronic Acid (RESTYLANE IJ), fluticasone, tretinoin, hydrOXYzine, fluocinonide cream, EPIPEN 2-PAK, triamcinolone cream, Cyanocobalamin (VITAMIN B-12 PO), ICAPS AREDS 2, fexofenadine, and conjugated estrogens.  Meds ordered this encounter  Medications  . conjugated estrogens (PREMARIN) vaginal cream    Sig: INSERT 1 GRAM VAGINALLY ONCE A WEEK AS DIRECTED     Dispense:  30 g    Refill:  3  . ranitidine (ZANTAC) 150 MG tablet    Sig: Take 1 tablet (150 mg total) by mouth daily.    Dispense:  90 tablet    Refill:  3     Follow-up: Return in about 6 months (around 03/14/2016) for a follow-up visit.  [name], MD

## 2015-09-12 NOTE — Progress Notes (Signed)
Pre visit review using our clinic review tool, if applicable. No additional management support is needed unless otherwise documented below in the visit note. 

## 2015-09-13 ENCOUNTER — Ambulatory Visit (INDEPENDENT_AMBULATORY_CARE_PROVIDER_SITE_OTHER): Payer: BLUE CROSS/BLUE SHIELD | Admitting: Neurology

## 2015-09-13 ENCOUNTER — Encounter: Payer: Self-pay | Admitting: Neurology

## 2015-09-13 VITALS — BP 120/68 | HR 68 | Resp 20 | Ht 62.0 in | Wt 99.5 lb

## 2015-09-13 DIAGNOSIS — M2669 Other specified disorders of temporomandibular joint: Secondary | ICD-10-CM

## 2015-09-13 DIAGNOSIS — R29898 Other symptoms and signs involving the musculoskeletal system: Secondary | ICD-10-CM | POA: Insufficient documentation

## 2015-09-13 LAB — CYTOLOGY - PAP

## 2015-09-13 NOTE — Progress Notes (Signed)
[facility]  Provider:  Dr Brett Fairy Referring Provider: Alain [name] L[place], MD Primary Care Physician:  [name] Kehr, MD    HPI:  [name] is a 65 y.o. female here as a referral from Dr. Alain [name] for follow up on her condition of  benign fasciculation.   05-16-15 [name] reports today that she has not noticed as many fasciculations since she has reduced her caffeine and chocolate intake. This is a great result. She had only one health scare. She developed a allergic reaction to a Z-pack. She got quite sick with it nauseated and having hives but this passed. A couple of weeks or months later she also had a bout of poisoning. Soon after she had a routine appointment with her ophthalmologist, [name]. He noted a cotton wool change in one of her eyes. He explained that there could be a lot of reasons for developing this and referred the patient back to her primary care physician, [name]. Dr. Alain [name] obtained a long list of metabolic and hematologic laboratory tests, all of which returned normal. She was also seen by [name], a retinal ophthalmology specialist. He confirmed a cotton-wool finding Within a month or 2 she had a follow-up appointment with [name], and the [facility] area had resolved. Her husband is now 18 - she stated he is still flying.   2015 She has meanwhile reduced or eliminated her caffeine intake and noticed a reduction in twitches. Her results of the EMG and NCV study have helped her to relax and fee safe with her condition.  She is snoring now, which doesn't bother her but her spouse. She has noted a feeling of thumping at the right ear and her ENT suggested that this may be a manifestation of her fasciculations.  Her parents lives in [name], [place]. Close to her sister, her father is in a rehab facility.  Her husband is an Emergency planning/management officer, now with [employer].    Last visit note :  The patient reports that she  had 2 spells of near-syncope one in May 2014 and one in June of 2014 . She also has a history of migraines with aura, benign fasciculations which have affected also facial muscles, at times presenting like a tic. The benign fasciculations have been present for over 2 decades and are not related to a minor atrophic lateral sclerosis. Pulse of the near-syncope spells but the patient reported had a diplopia component and appeared to have been experienced as a vertical. The patient reports that the first spell was likely related to dehydration but is not quite sure about the second. The spells don't last long and if she takes fluid and fluid they quickly resolve and do not return. She was sent by her primary care physician for an MRI of the brain which returned normal. She has been seen by [name]  for the brief dizziness, loss of vision , diplopia.   She is diagnosed of with macular degeneration early stage , myopia, and ocular migraines.  Vision was 20/25 in both eyes, ocular  pressure was 18 in the right, and 19 mm in the left eye and [name]  reported that the patient is early cataract formation but can see well. The optic nerves are healthy , but there is a macular drusen in the right eye he recommended ocular vitamins and to continue contact lens use the patient was otherwise found to be in good health ,and he did not impose any restrictions  on driving activities of daily living etc. The detailed report is reviewed here today as is her MRI report which was entirely normal.  I explained that migraines can change to an [place] state with reaching the 5th decade of life. Many women that experienced severe nausea and headaches associated with menstrual cycles, has later in life the aura i without  headaches to follow . Is benign fasciculations there is truly not changed there is no treatment available nor is it necessary. Is important for the patient to stay hydrated and could not get hypoglycemic as  these will lead to presyncopal spells as well as an aunt who took and frequency rise in fasciculations, and  asked her to restrict caffeine intake.  09-13-2015 Benign fasciculations resolved !  Patient presents now with an ear pain, left side only. Nothing visible on the left ear.  Her orthodontist noted some TMJ  click.  At the same time she had half sided headaches. These were not neuralgic and have resolved.  The patient does not recall having any zoster outbreaks, she did have some aphtose stomatitis non-herpetic. Dentist found no herpetic lesions and Hpe [name] tested for zoster- negative.  Since she does have a slight click at the left temporomandibular joint I recommend that she will purchase a baby toothbrush, one of those that are worn on top of the index finger like a thimble, and massages from the inside the area of the TMJ joint, she can use it on the outside as well. This helps TMJ pain and click to disappear quicker. At this time I'm just happy that her symptoms have resolved and I'm happy to see her for any recurrent issues or new problems in the future.    Review of Systems: Out of a complete 14 system review, the patient complains of only the following symptoms, and all other reviewed systems are negative. Less twitching . Snoring reported again, she is complaining of nasal drip , eye itching . She is treated by Warren Lacy, MD at [facility] allergy. She inquired about a sleep evaluation. I would like for her to use her nasal spray at night to see if she would stop mouth breathing which is the main reason for snoring. If this alone a successful she would not need to return for a sleep evaluation, but I will be happy to offer one if this is not the solution to her problem. She prefers a supine sleep position.  Social History   Social History  . Marital Status: Married    Spouse Name: [name]  . Number of Children: 0  . Years of Education: 14   Occupational History  . OFFICE  liasion   . [employer]   . [employer]    Social History Main Topics  . Smoking status: Never Smoker   . Smokeless tobacco: Never Used  . Alcohol Use: No  . Drug Use: No  . Sexual Activity: Not on file   Other Topics Concern  . Not on file   Social History Narrative   [employer]  in [place]. Married '90 - marriage is in very good shape '12, No children.  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 Offerred medical services for them if needed (Nov '11)   Patient is married [name]) and lives at home with her husband.   Patient is working Administrator, arts work.   Patient has a high school  education.   Patient is right-handed.   Patient does not drink any caffeine.          Family History  Problem Relation Age of Onset  . Hyperlipidemia Father   . COPD Father     Smoker, deceased May 25, 2014  . Hyperlipidemia Mother   . Diabetes Neg Hx   . Colon cancer Neg Hx   . Breast cancer Neg Hx   . Coronary artery disease Neg Hx   . Cancer Neg Hx     breast or colon  . Hypertension Father   . Hypertension Mother     Past Medical History  Diagnosis Date  . Mild aortic insufficiency   . Mild tricuspid regurgitation   . Internal hemorrhoids without mention of complication   . Unspecified constipation   . Irritable bowel syndrome   . Hemorrhage of rectum and anus   . Microscopic hematuria   . Personal history of urinary calculi   . Other plastic surgery for unacceptable cosmetic appearance     Inj tx fllers/expander  . Esophageal reflux   . Postmenopausal atrophic vaginitis   . Backache, unspecified   . Allergic rhinitis, cause unspecified   . Conversion disorder   . Calculus of kidney   . Migraine headache with aura   . Benign fasciculation-cramp syndrome ([place]) 12/03/2012     Worsening with  stress, fatigue .  [name] Osteopenia   . Premature atrial contractions   . Vasovagal syncope    . GERD (gastroesophageal reflux disease)   . PVC (premature ventricular contraction)     a. Event monitor 2013.  [name] Cotton wool spots     Past Surgical History  Procedure Laterality Date  . Cosmetic procedures w/injection therapy    . Wisdom tooth extraction    . Breast biopsy Right   . Skin cancer excision Left     thigh    Current Outpatient Prescriptions  Medication Sig Dispense Refill  . ALREX 0.2 % SUSP Apply 0.2 drops to eye as needed (allergies).     . Botulinum Toxin Type A, Cosm, (BOTOX COSMETIC) 50 UNITS SOLR Inject 4-6 Units into the muscle. 3 month  injection    . conjugated estrogens (PREMARIN) vaginal cream INSERT 1 GRAM VAGINALLY ONCE A WEEK AS DIRECTED 30 g 3  . Cyanocobalamin (VITAMIN B-12 PO) Take 1 tablet by mouth daily.    [name] EPIPEN 2-PAK 0.3 MG/0.3ML SOAJ injection See admin instructions.  1  . fexofenadine (ALLEGRA ALLERGY CHILDRENS) 30 MG tablet Take 30 mg by mouth 2 (two) times daily as needed (allergies).    . fluocinonide cream (LIDEX) [id number] % Apply 1 application topically 2 (two) times daily. 90 g 2  . fluticasone (FLONASE) 50 MCG/ACT nasal spray Place 1 spray into both nostrils as needed for allergies.   5  . Hyaluronic Acid (RESTYLANE IJ) Inject as directed every 6 (six) months.    . hydrOXYzine (ATARAX/VISTARIL) 10 MG tablet Take 1 tablet (10 mg total) by mouth 3 (three) times daily as needed. 30 tablet 0  . Multiple Vitamins-Minerals (ICAPS AREDS 2) CAPS Take 1 capsule by mouth daily.    . ranitidine (ZANTAC) 150 MG tablet Take 1 tablet (150 mg total) by mouth daily. 90 tablet 3  . tretinoin (RETIN-A) 0.05 % cream Apply 1 application topically at bedtime.    . triamcinolone cream (KENALOG) 0.1 % Apply 1 application topically 2 (two) times daily. 30 g 0   No current facility-administered medications for this visit.  Allergies as of 09/13/2015 - Review Complete 09/13/2015  Allergen Reaction Noted  . Codeine Other (See Comments)   . Epinephrine   08/12/2012  . Neomycin  03/08/2015  . Penicillins Hives and Other (See Comments)   . Sulfonamide derivatives Other (See Comments)   . Zithromax [azithromycin] Rash 08/18/2014    Vitals: BP 120/68 mmHg  Pulse 68  Resp 20  Ht 5\' 2"  (1.575 m)  Wt 99 lb 8 oz (45.133 kg)  BMI 18.19 kg/m2 Last Weight:  Wt Readings from Last 1 Encounters:  09/13/15 99 lb 8 oz (45.133 kg)   Last Height:   Ht Readings from Last 1 Encounters:  09/13/15 5\' 2"  (1.575 m)   Vision Screening:  Left eye with correction 25/20.  Right eye with correction 25/20. Monovision, contacts.   Physical exam:  General: The patient is awake, alert and appears not in acute distress. The patient is well groomed. Head: Normocephalic, atraumatic. Neck is supple. Mallampati 1- wide open-  neck circumference 12.5 inches, No retrognathia. TMJ click on the right.  Cardiovascular:  Regular rate and rhythm, without  murmurs or carotid bruit, and without distended neck veins. Respiratory: Lungs are clear to auscultation. Skin:  Without evidence of edema, or rash Trunk: BMI low normal.  Neurologic exam : The patient is awake and alert, oriented to place and time.   Memory subjective described as intact. There is a normal attention span & concentration ability.  Speech is fluent without  dysarthria, dysphonia or aphasia. Mood and affect are appropriate.  Cranial nerves: Pupils are equal and briskly reactive to light. Extraocular movements  in vertical and horizontal planes intact and without nystagmus.  Hearing to finger rub intact.   Facial sensation intact to fine touch. Facial motor strength is symmetric and tongue and uvula move midline. Motor exam: Normal tone and normal muscle bulk and symmetric normal strength in all extremities. No fasciculations.  Sensory:  Fine touch, pinprick and vibration were normal  in all extremities.  Proprioception is tested in the upper extremities only. This was normal. Coordination: Rapid  alternating movements in the fingers/hands isnormal.  Finger-to-nose maneuver tested and normal without evidence of ataxia, dysmetria or tremor. Gait and station: Patient walks without assistive device.  Strength within normal limits. Stance is stable and normal.  Tandem gait isunfragmented. Romberg testing normal. Deep tendon reflexes: in the upper and lower extremities are symmetric and intact.    Assessment:  After physical and neurologic examination, review of laboratory studies, imaging, neurophysiology testing and pre-existing records.  Benign fasciculations resolved !  Patient presents now with an ear pain, left side only. Nothing visible on the left ear.  Her orthodontist noted some TMJ  click.  At the same time she had half sided headaches. All resolved , see above.   Emmilyn Crooke, MD   CC Dr. Patrecia Pour

## 2015-09-18 ENCOUNTER — Encounter: Payer: Self-pay | Admitting: Internal Medicine

## 2015-09-19 ENCOUNTER — Encounter: Payer: Self-pay | Admitting: *Deleted

## 2015-09-19 ENCOUNTER — Ambulatory Visit: Payer: BLUE CROSS/BLUE SHIELD

## 2015-09-21 ENCOUNTER — Ambulatory Visit: Payer: BLUE CROSS/BLUE SHIELD

## 2015-09-28 ENCOUNTER — Ambulatory Visit (INDEPENDENT_AMBULATORY_CARE_PROVIDER_SITE_OTHER): Payer: BLUE CROSS/BLUE SHIELD

## 2015-09-28 DIAGNOSIS — Z23 Encounter for immunization: Secondary | ICD-10-CM

## 2015-09-29 ENCOUNTER — Telehealth: Payer: Self-pay | Admitting: Internal Medicine

## 2015-09-29 NOTE — Telephone Encounter (Signed)
Patient called in to ask about ranitidine (ZANTAC) 150 MG tablet [id number]. The zantac doesn't seem to be allowing her to step back to one or less per day. She states that she spoke with you last year, and you told her that she could step the zantac up to 2 per day along with taking (what she thought was) pepcid AC. i saw no notes on this, and was unable to advise. Please advise on how she should proceed.

## 2015-10-02 MED ORDER — RANITIDINE HCL 150 MG PO TABS: 150.0000 mg | ORAL_TABLET | Freq: Two times a day (BID) | ORAL | Status: DC

## 2015-10-02 NOTE — Telephone Encounter (Signed)
OK ranitidine 150 mg bid Do not take Pepcid AC Thx

## 2015-10-03 NOTE — Telephone Encounter (Signed)
Called left vm ..

## 2015-10-04 ENCOUNTER — Other Ambulatory Visit (INDEPENDENT_AMBULATORY_CARE_PROVIDER_SITE_OTHER): Payer: BLUE CROSS/BLUE SHIELD

## 2015-10-04 DIAGNOSIS — Z0000 Encounter for general adult medical examination without abnormal findings: Secondary | ICD-10-CM | POA: Diagnosis not present

## 2015-10-04 LAB — BASIC METABOLIC PANEL
BUN: 18 mg/dL (ref 6–23)
CHLORIDE: 104 meq/L (ref 96–112)
CO2: 30 meq/L (ref 19–32)
Calcium: 9.9 mg/dL (ref 8.4–10.5)
Creatinine, Ser: 0.82 mg/dL (ref 0.40–1.20)
GFR: 74.85 mL/min (ref >60.00)
GLUCOSE: 92 mg/dL (ref 70–99)
POTASSIUM: 4.4 meq/L (ref 3.5–5.1)
SODIUM: 140 meq/L (ref 135–145)

## 2015-10-04 LAB — CBC WITH DIFFERENTIAL/PLATELET
BASOS ABS: 0 10*3/uL (ref 0.0–0.1)
Basophils Relative: 1.1 % (ref 0.0–3.0)
Eosinophils Absolute: 0.2 10*3/uL (ref 0.0–0.7)
Eosinophils Relative: 4.4 % (ref 0.0–5.0)
HCT: 38.8 % (ref 36.0–46.0)
Hemoglobin: 13.2 g/dL (ref 12.0–15.0)
LYMPHS ABS: 1.5 10*3/uL (ref 0.7–4.0)
Lymphocytes Relative: 34.9 % (ref 12.0–46.0)
MCHC: 34.1 g/dL (ref 30.0–36.0)
MCV: 90.2 fl (ref 78.0–100.0)
MONO ABS: 0.4 10*3/uL (ref 0.1–1.0)
Monocytes Relative: 8.7 % (ref 3.0–12.0)
Neutro Abs: 2.2 10*3/uL (ref 1.4–7.7)
Neutrophils Relative %: 50.9 % (ref 43.0–77.0)
Platelets: 179 10*3/uL (ref 150.0–400.0)
RBC: 4.3 Mil/uL (ref 3.87–5.11)
RDW: 13 % (ref 11.5–15.5)
WBC: 4.2 10*3/uL (ref 4.0–10.5)

## 2015-10-04 LAB — LIPID PANEL
CHOLESTEROL: 202 mg/dL — AB (ref 0–200)
HDL: 71.2 mg/dL (ref >39.00)
LDL Cholesterol: 116 mg/dL — ABNORMAL HIGH (ref 0–99)
NONHDL: 130.38
Total CHOL/HDL Ratio: 3
Triglycerides: 71 mg/dL (ref 0.0–149.0)
VLDL: 14.2 mg/dL (ref 0.0–40.0)

## 2015-10-04 LAB — URINALYSIS, ROUTINE W REFLEX MICROSCOPIC
BILIRUBIN URINE: NEGATIVE
KETONES UR: NEGATIVE
LEUKOCYTES UA: NEGATIVE
NITRITE: NEGATIVE
PH: 6 (ref 5.0–8.0)
SPECIFIC GRAVITY, URINE: 1.015 (ref 1.000–1.030)
TOTAL PROTEIN, URINE-UPE24: NEGATIVE
UROBILINOGEN UA: 0.2 (ref 0.0–1.0)
Urine Glucose: NEGATIVE

## 2015-10-04 LAB — HEPATIC FUNCTION PANEL
ALBUMIN: 4.4 g/dL (ref 3.5–5.2)
ALK PHOS: 65 U/L (ref 39–117)
ALT: 13 U/L (ref 0–35)
AST: 16 U/L (ref 0–37)
Bilirubin, Direct: 0.1 mg/dL (ref 0.0–0.3)
TOTAL PROTEIN: 7.5 g/dL (ref 6.0–8.3)
Total Bilirubin: 0.6 mg/dL (ref 0.2–1.2)

## 2015-10-04 LAB — TSH: TSH: 1.84 u[IU]/mL (ref 0.35–4.50)

## 2015-10-05 ENCOUNTER — Encounter: Payer: Self-pay | Admitting: Internal Medicine

## 2015-10-05 LAB — HEPATITIS C ANTIBODY: HCV AB: NEGATIVE

## 2015-10-08 ENCOUNTER — Encounter: Payer: Self-pay | Admitting: Family Medicine

## 2015-10-08 ENCOUNTER — Ambulatory Visit (INDEPENDENT_AMBULATORY_CARE_PROVIDER_SITE_OTHER): Payer: BLUE CROSS/BLUE SHIELD | Admitting: Family Medicine

## 2015-10-08 VITALS — BP 104/64 | HR 65 | Temp 98.1°F | Ht 62.0 in | Wt 100.0 lb

## 2015-10-08 DIAGNOSIS — H8111 Benign paroxysmal vertigo, right ear: Secondary | ICD-10-CM

## 2015-10-08 DIAGNOSIS — H8110 Benign paroxysmal vertigo, unspecified ear: Secondary | ICD-10-CM | POA: Insufficient documentation

## 2015-10-08 MED ORDER — MECLIZINE HCL 25 MG PO TABS: 25.0000 mg | ORAL_TABLET | Freq: Three times a day (TID) | ORAL | Status: DC | PRN

## 2015-10-08 NOTE — Progress Notes (Signed)
Pre visit review using our clinic review tool, if applicable. No additional management support is needed unless otherwise documented below in the visit note. 

## 2015-10-08 NOTE — Patient Instructions (Signed)
Continue home desensitization exercises 2-3 times daily.  Increase flonase to 2 sprays per nostril temporarily. Can use meclizine for vertigo as needed.  Call PCP if not improving in 2 weks for further recommendations.  Benign Positional Vertigo Vertigo is the feeling that you or your surroundings are moving when they are not. Benign positional vertigo is the most common form of vertigo. The cause of this condition is not serious (is benign). This condition is triggered by certain movements and positions (is positional). This condition can be dangerous if it occurs while you are doing something that could endanger you or others, such as driving.  CAUSES In many cases, the cause of this condition is not known. It may be caused by a disturbance in an area of the inner ear that helps your brain to sense movement and balance. This disturbance can be caused by a viral infection (labyrinthitis), head injury, or repetitive motion. RISK FACTORS This condition is more likely to develop in:  Women.  People who are 47 years of age or older. SYMPTOMS Symptoms of this condition usually happen when you move your head or your eyes in different directions. Symptoms may start suddenly, and they usually last for less than a minute. Symptoms may include:  Loss of balance and falling.  Feeling like you are spinning or moving.  Feeling like your surroundings are spinning or moving.  Nausea and vomiting.  Blurred vision.  Dizziness.  Involuntary eye movement (nystagmus). Symptoms can be mild and cause only slight annoyance, or they can be severe and interfere with daily life. Episodes of benign positional vertigo may return (recur) over time, and they may be triggered by certain movements. Symptoms may improve over time. DIAGNOSIS This condition is usually diagnosed by medical history and a physical exam of the head, neck, and ears. You may be referred to a health care provider who specializes in ear,  nose, and throat (ENT) problems (otolaryngologist) or a provider who specializes in disorders of the nervous system (neurologist). You may have additional testing, including:  MRI.  A CT scan.  Eye movement tests. Your health care provider may ask you to change positions quickly while he or she watches you for symptoms of benign positional vertigo, such as nystagmus. Eye movement may be tested with an electronystagmogram (ENG), caloric stimulation, the Dix-Hallpike test, or the roll test.  An electroencephalogram (EEG). This records electrical activity in your brain.  Hearing tests. TREATMENT Usually, your health care provider will treat this by moving your head in specific positions to adjust your inner ear back to normal. Surgery may be needed in severe cases, but this is rare. In some cases, benign positional vertigo may resolve on its own in 2-4 weeks. HOME CARE INSTRUCTIONS Safety  Move slowly.Avoid sudden body or head movements.  Avoid driving.  Avoid operating heavy machinery.  Avoid doing any tasks that would be dangerous to you or others if a vertigo episode would occur.  If you have trouble walking or keeping your balance, try using a cane for stability. If you feel dizzy or unstable, sit down right away.  Return to your normal activities as told by your health care provider. Ask your health care provider what activities are safe for you. General Instructions  Take over-the-counter and prescription medicines only as told by your health care provider.  Avoid certain positions or movements as told by your health care provider.  Drink enough fluid to keep your urine clear or pale yellow.  Keep all  follow-up visits as told by your health care provider. This is important. SEEK MEDICAL CARE IF:  You have a fever.  Your condition gets worse or you develop new symptoms.  Your family or friends notice any behavioral changes.  Your nausea or vomiting gets worse.  You  have numbness or a "pins and needles" sensation. SEEK IMMEDIATE MEDICAL CARE IF:  You have difficulty speaking or moving.  You are always dizzy.  You faint.  You develop severe headaches.  You have weakness in your legs or arms.  You have changes in your hearing or vision.  You develop a stiff neck.  You develop sensitivity to light.   This information is not intended to replace advice given to you by your health care provider. Make sure you discuss any questions you have with your health care provider.   Document Released: 03/12/2006 Document Revised: 02/23/2015 Document Reviewed: 09/27/2014 Elsevier Interactive Patient Education [employer].

## 2015-10-08 NOTE — Progress Notes (Signed)
Subjective:    Patient ID: [name], female    DOB: [birth year], 65 y.o.   MRN: [id number]  HPI  65 year old female pt of Dr. Mamie Nick with history of BPPV presetns with worsening vertigo, nausea and emesis.   She reports she was doing well until mid week 4 days ago.  Started with sinus pressure headache.  Tried sudafed. Had episode of emesis. Later that day headache went away. Today this AM she awoke felling lightheaded, nausea.  Dull head pressure, but not headache. When lie side ways or look up vertigo starts.  No nasal congestion, occ sneezing. Treated by Dr. Remus Blake with shots for allergies. Started flonase, only one per nostril daily.  She has been trying positional desensitization treatments at home.  No recent fall or head injury.  Social History /Family History/Past Medical History reviewed and updated if needed.  Review of Systems  Constitutional: Negative for fever and fatigue.  HENT: Negative for ear pain.   Eyes: Negative for pain.  Respiratory: Negative for chest tightness and shortness of breath.   Cardiovascular: Negative for chest pain, palpitations and leg swelling.  Gastrointestinal: Negative for abdominal pain.  Genitourinary: Negative for dysuria.       Objective:   Physical Exam  Constitutional: She is oriented to person, place, and time. Vital signs are normal. She appears well-developed and well-nourished. She is cooperative.  Non-toxic appearance. She does not appear ill. No distress.  HENT:  Head: Normocephalic.  Right Ear: Hearing, tympanic membrane, external ear and ear canal normal. Tympanic membrane is not erythematous, not retracted and not bulging.  Left Ear: Hearing, tympanic membrane, external ear and ear canal normal. Tympanic membrane is not erythematous, not retracted and not bulging.  Nose: Mucosal edema and rhinorrhea present. Right sinus exhibits no maxillary sinus tenderness and no frontal sinus tenderness. Left sinus exhibits no  maxillary sinus tenderness and no frontal sinus tenderness.  Mouth/Throat: Uvula is midline, oropharynx is clear and moist and mucous membranes are normal.  Eyes: Conjunctivae, EOM and lids are normal. Pupils are equal, round, and reactive to light. Lids are everted and swept, no foreign bodies found.  Neck: Trachea normal and normal [name] of motion. Neck supple. Carotid bruit is not present. No thyroid mass and no thyromegaly present.  Cardiovascular: Normal rate, regular rhythm, S1 normal, S2 normal, normal heart sounds, intact distal pulses and normal pulses.  Exam reveals no gallop and no friction rub.   No murmur heard. Pulmonary/Chest: Effort normal and breath sounds normal. No tachypnea. No respiratory distress. She has no decreased breath sounds. She has no wheezes. She has no rhonchi. She has no rales.  Abdominal: Soft. Normal appearance and bowel sounds are normal. There is no tenderness.  Neurological: She is alert and oriented to person, place, and time. She has normal strength and normal reflexes. No cranial nerve deficit or sensory deficit. She exhibits normal muscle tone. She displays a negative Romberg sign. Coordination and gait normal. GCS eye subscore is 4. GCS verbal subscore is 5. GCS motor subscore is 6.  Nml cerebellar exam   No papilledema  Vertigo and nystagmus triggered with Marye Round Modified.  Skin: Skin is warm, dry and intact. No rash noted.  Psychiatric: She has a normal mood and affect. Her speech is normal and behavior is normal. Judgment and thought content normal. Her mood appears not anxious. Cognition and memory are normal. Cognition and memory are not impaired. She does not exhibit a depressed mood. She exhibits  normal recent memory and normal remote memory.          Assessment & Plan:

## 2015-10-08 NOTE — Assessment & Plan Note (Signed)
Treat with home desensitization exercises. Can use meclizine prn. Can increase flonase to 2 sprays per nostril given possible allergy issue. If not improving in 2 weeks call for further eval or referral for balance PT.

## 2015-10-20 ENCOUNTER — Telehealth: Payer: Self-pay | Admitting: Family Medicine

## 2015-10-20 NOTE — Telephone Encounter (Signed)
Left message with recommendations per Dr. Tamala Julian and to schedule something for next week in case it is not resolved.

## 2015-10-20 NOTE — Telephone Encounter (Signed)
Please follow up with patient.  She pulled or agrrevated something in her neck.  Would like to know if she needs to apply heat or cold therapy.

## 2015-10-20 NOTE — Telephone Encounter (Signed)
Ice 20 minutes 2 times daily. Usually after activity and before bed. I would also take  Ibuprofen 600mg  3 times a dya for 3 days  MAke appointment with me next week if not gone.

## 2015-10-24 ENCOUNTER — Ambulatory Visit (INDEPENDENT_AMBULATORY_CARE_PROVIDER_SITE_OTHER): Payer: BLUE CROSS/BLUE SHIELD | Admitting: Family Medicine

## 2015-10-24 ENCOUNTER — Encounter: Payer: Self-pay | Admitting: Family Medicine

## 2015-10-24 VITALS — BP 112/76 | HR 72 | Wt 104.0 lb

## 2015-10-24 DIAGNOSIS — M5010 Cervical disc disorder with radiculopathy, unspecified cervical region: Secondary | ICD-10-CM

## 2015-10-24 NOTE — Progress Notes (Signed)
  [name] [address] [place], [place] [address] Phone: [phone] Subjective:    I'm seeing this patient by the request  of:  [name], MD   CC: Neck pain follow-up  [id number] [name] is a 65 y.o. female coming in with complaint of neck pain. Patient was found to have degenerative disc disease at multiple levels of the cervical spine. Patient's 72 hours ago did have a flare of neck pain. Here for further follow-up. When patient started the ibuprofen and icing regularly she seems to be doing better. Patient was doing lifting over her head when this occurred. Continued to remain active. Was to continue to be active. Patient is wondering what she needs to point not have that type of pain. Denies any radiation down the arm or any numbness or tingling.    Past medical history, social, surgical and family history all reviewed in electronic medical record.   Review of Systems: No headache, visual changes, nausea, vomiting, diarrhea, constipation, dizziness, abdominal pain, skin rash, fevers, chills, night sweats, weight loss, swollen lymph nodes, body aches, joint swelling, muscle aches, chest pain, shortness of breath, mood changes.   Objective Blood pressure 112/76, pulse 72, weight 104 lb (47.174 kg).  General: No apparent distress alert and oriented x3 mood and affect normal, dressed appropriately.  HEENT: Pupils equal, extraocular movements intact  Respiratory: Patient's speak in full sentences and does not appear short of breath  Cardiovascular: No lower extremity edema, non tender, no erythema  Skin: Warm dry intact with no signs of infection or rash on extremities or on axial skeleton.  Abdomen: Patient does have involuntary guarding of the left lower quadrant as well as the epigastric region. No rebound tenderness noted. No masses palpated. Neuro: Cranial nerves II through XII are intact, neurovascularly intact in all extremities  with 2+ DTRs and 2+ pulses.  Lymph: No lymphadenopathy of posterior or anterior cervical chain or axillae bilaterally.  Gait normal with good balance and coordination.  MSK:  Non tender with full range of motion and good stability and symmetric strength and tone of shoulders, elbows, wrist, hip, knee and ankles bilaterally.  Neck: Inspection unremarkable. No palpable stepoffs. Negative Spurling's today Continues to have some difficulty with tightness on the left side. Lacking the last 5 of extension as well as right-sided side bending. Minorly worse than previous exam. Grip strength and sensation normal in bilateral hands Strength good C4 to T1 distribution No sensory change to C4 to T1 Negative Hoffman sign bilaterally Reflexes normal    Impression and Recommendations:     This case required medical decision making of moderate complexity.

## 2015-10-24 NOTE — Patient Instructions (Signed)
Good to see you  Ice 20 minutes 2 times daily. Usually after activity and before bed.Can do heat before as well.  Ibuprofen 600mg  3 times a day for 3 days if in pain  Keep hands within peripheral vision and I would avoid any overhead lifting.  Pennsaid when you need it.  See me again when you need me.

## 2015-10-24 NOTE — Assessment & Plan Note (Signed)
Discussed with patient at this time. Given strict lifting mechanics. We discussed keeping hands within peripheral vision. Discussed topical anti-inflammatories and what to do for any breakthrough pain. Patient and will come back and see me again more on an as-needed basis as long she does well. Persisting symptoms to come back sooner.

## 2015-10-27 ENCOUNTER — Ambulatory Visit: Payer: BLUE CROSS/BLUE SHIELD | Admitting: Family Medicine

## 2015-12-28 ENCOUNTER — Encounter: Payer: Self-pay | Admitting: Internal Medicine

## 2015-12-28 ENCOUNTER — Ambulatory Visit (INDEPENDENT_AMBULATORY_CARE_PROVIDER_SITE_OTHER): Payer: BLUE CROSS/BLUE SHIELD | Admitting: Internal Medicine

## 2015-12-28 VITALS — BP 108/70 | HR 77 | Wt 102.0 lb

## 2015-12-28 DIAGNOSIS — G8929 Other chronic pain: Secondary | ICD-10-CM | POA: Diagnosis not present

## 2015-12-28 DIAGNOSIS — G44209 Tension-type headache, unspecified, not intractable: Secondary | ICD-10-CM | POA: Insufficient documentation

## 2015-12-28 DIAGNOSIS — M542 Cervicalgia: Secondary | ICD-10-CM

## 2015-12-28 DIAGNOSIS — G43109 Migraine with aura, not intractable, without status migrainosus: Secondary | ICD-10-CM

## 2015-12-28 NOTE — Progress Notes (Signed)
Pre visit review using our clinic review tool, if applicable. No additional management support is needed unless otherwise documented below in the visit note. 

## 2015-12-28 NOTE — Assessment & Plan Note (Signed)
Chronic. 

## 2015-12-28 NOTE — Assessment & Plan Note (Signed)
Improve office ergonomics

## 2015-12-28 NOTE — Progress Notes (Signed)
Subjective:  Patient ID: [name], female    DOB: [birth year]  Age: 65 y.o. MRN: [id number]  CC: Neck Pain and Headache   HPI Wells Arispe presents for posterior HAs worse over past 2 weeks - 3 HAs per week in am's mostly - some w/auras and some not; "tesion HAs" w/neck pain. Motrin helps w/either HA. No known triggers. No coffee, no alcohol... H/o migraines  Outpatient Prescriptions Prior to Visit  Medication Sig Dispense Refill  . ALREX 0.2 % SUSP Apply 0.2 drops to eye as needed (allergies).     . Botulinum Toxin Type A, Cosm, (BOTOX COSMETIC) 50 UNITS SOLR Inject 4-6 Units into the muscle. 3 month  injection    . conjugated estrogens (PREMARIN) vaginal cream INSERT 1 GRAM VAGINALLY ONCE A WEEK AS DIRECTED 30 g 3  . Cyanocobalamin (VITAMIN B-12 PO) Take 1 tablet by mouth daily.    [name] EPIPEN 2-PAK 0.3 MG/0.3ML SOAJ injection See admin instructions. Reported on 10/08/2015  1  . fexofenadine (ALLEGRA ALLERGY CHILDRENS) 30 MG tablet Take 30 mg by mouth 2 (two) times daily as needed (allergies).    . fluocinonide cream (LIDEX) [id number] % Apply 1 application topically 2 (two) times daily. 90 g 2  . fluticasone (FLONASE) 50 MCG/ACT nasal spray Place 1 spray into both nostrils as needed for allergies.   5  . Hyaluronic Acid (RESTYLANE IJ) Inject as directed every 6 (six) months.    . meclizine (ANTIVERT) 25 MG tablet Take 1 tablet (25 mg total) by mouth 3 (three) times daily as needed for dizziness. 30 tablet 0  . Multiple Vitamins-Minerals (ICAPS AREDS 2) CAPS Take 1 capsule by mouth daily. Reported on 10/08/2015    . ranitidine (ZANTAC) 150 MG tablet Take 1 tablet (150 mg total) by mouth 2 (two) times daily. 180 tablet 3  . tretinoin (RETIN-A) 0.05 % cream Apply 1 application topically at bedtime.    . triamcinolone cream (KENALOG) 0.1 % Apply 1 application topically 2 (two) times daily. 30 g 0   No facility-administered medications prior to visit.    ROS Review of Systems    Constitutional: Negative for chills, activity change, appetite change, fatigue and unexpected weight change.  HENT: Negative for congestion, mouth sores and sinus pressure.   Eyes: Negative for visual disturbance.  Respiratory: Negative for cough and chest tightness.   Gastrointestinal: Negative for nausea and abdominal pain.  Genitourinary: Negative for frequency, difficulty urinating and vaginal pain.  Musculoskeletal: Positive for neck pain. Negative for back pain and gait problem.  Skin: Negative for pallor and rash.  Neurological: Positive for headaches. Negative for dizziness, tremors, facial asymmetry, weakness, light-headedness and numbness.  Psychiatric/Behavioral: Negative for confusion and sleep disturbance.    Objective:  BP 108/70 mmHg  Pulse 77  Wt 102 lb (46.267 kg)  SpO2 97%  BP Readings from Last 3 Encounters:  12/28/15 108/70  10/24/15 112/76  10/08/15 104/64    Wt Readings from Last 3 Encounters:  12/28/15 102 lb (46.267 kg)  10/24/15 104 lb (47.174 kg)  10/08/15 100 lb (45.36 kg)    Physical Exam  Constitutional: She appears well-developed. No distress.  HENT:  Head: Normocephalic.  Right Ear: External ear normal.  Left Ear: External ear normal.  Nose: Nose normal.  Mouth/Throat: Oropharynx is clear and moist.  Eyes: Conjunctivae are normal. Pupils are equal, round, and reactive to light. Right eye exhibits no discharge. Left eye exhibits no discharge.  Neck: Normal [name] of motion. Neck supple.  No JVD present. No tracheal deviation present. No thyromegaly present.  Cardiovascular: Normal rate, regular rhythm and normal heart sounds.   Pulmonary/Chest: No stridor. No respiratory distress. She has no wheezes.  Abdominal: Soft. Bowel sounds are normal. She exhibits no distension and no mass. There is no tenderness. There is no rebound and no guarding.  Musculoskeletal: She exhibits tenderness. She exhibits no edema.  Lymphadenopathy:    She has no  cervical adenopathy.  Neurological: She displays normal reflexes. No cranial nerve deficit. She exhibits normal muscle tone. Coordination normal.  Skin: No rash noted. No erythema.  Psychiatric: She has a normal mood and affect. Her behavior is normal. Judgment and thought content normal.  posterior neck muscles and temples are sensitive to palpation  Lab Results  Component Value Date   WBC 4.2 10/04/2015   HGB 13.2 10/04/2015   HCT 38.8 10/04/2015   PLT 179.0 10/04/2015   GLUCOSE 92 10/04/2015   CHOL 202* 10/04/2015   TRIG 71.0 10/04/2015   HDL 71.20 10/04/2015   LDLCALC 116* 10/04/2015   ALT 13 10/04/2015   AST 16 10/04/2015   NA 140 10/04/2015   K 4.4 10/04/2015   CL 104 10/04/2015   CREATININE 0.82 10/04/2015   BUN 18 10/04/2015   CO2 30 10/04/2015   TSH 1.84 10/04/2015    No results found.  Assessment & Plan:   There are no diagnoses linked to this encounter. I am having Ms. Whittingham maintain her Botulinum Toxin Type A (Cosm), ALREX, Hyaluronic Acid (RESTYLANE IJ), fluticasone, tretinoin, fluocinonide cream, EPIPEN 2-PAK, triamcinolone cream, Cyanocobalamin (VITAMIN B-12 PO), ICAPS AREDS 2, fexofenadine, conjugated estrogens, ranitidine, and meclizine.  No orders of the defined types were placed in this encounter.     Follow-up: No Follow-up on file.  [name], MD

## 2015-12-28 NOTE — Assessment & Plan Note (Addendum)
  Posterior HAs worse over past 2 weeks - 3 HAs per week in am's mostly - some w/auras and some not; "tesion HAs" w/neck pain. Motrin helps w/either HA.  Improve office ergonomics Neurology referral

## 2015-12-29 ENCOUNTER — Ambulatory Visit: Payer: BLUE CROSS/BLUE SHIELD | Admitting: Internal Medicine

## 2016-01-25 ENCOUNTER — Telehealth: Payer: Self-pay | Admitting: Cardiovascular Disease

## 2016-01-25 NOTE — Telephone Encounter (Signed)
No new recommendations. thanks

## 2016-01-25 NOTE — Telephone Encounter (Signed)
Spoke with pt. She reports episode of double vision in right eye yesterday.  Lasted a few minutes. Vision OK today. No other symptoms associated with this.  States she had similar episode in the past a few years ago but it was in both eyes.  Chart reviewed and this was in 6/14. MRI was OK at that time and she was referred to neuro.  She was recently referred to neuro due to migraine headaches and has appt on 02/15/16.   She reports chest tightness that goes through to back since Sunday. Comes and goes. She has not taken anything for this and it goes away on it's own.  Not related to activity. Gets a little better at night and she is able to sleep but it comes back during the day.  No recent lifting or straining. Reports she has had a lot of company recently and this may be causing some anxiety. I asked pt to follow up with primary care for these issues and to keep appt with neuro.   I told her I would forward to Dr. Angelena Form for review and call her back if he had any additional recommendations.

## 2016-01-25 NOTE — Telephone Encounter (Signed)
[name] is calling because she is experiencing some double vision on yesterday in the right eye and some Chest tightness . Please call   Thanks

## 2016-01-27 ENCOUNTER — Telehealth: Payer: Self-pay | Admitting: *Deleted

## 2016-01-27 MED ORDER — PANTOPRAZOLE SODIUM 40 MG PO TBEC: 40.0000 mg | DELAYED_RELEASE_TABLET | Freq: Every day | ORAL | 11 refills | Status: DC

## 2016-01-27 NOTE — Telephone Encounter (Signed)
OK - done Thx 

## 2016-01-27 NOTE — Telephone Encounter (Signed)
Rec'd call pt states she has been taking the Ranitidine quite sometimes now. She still have acid reflux & cough sxs while taking the medicine. Pt states she tried Pantoprazole before and she feel that works better than the ranitidine. Wanting to see will MD rx Pantoprazole. Pls advise....[name]

## 2016-01-30 NOTE — Telephone Encounter (Signed)
Called pt no answer LMOM MD sent pantoprazole to [facility].../lmb

## 2016-02-03 ENCOUNTER — Ambulatory Visit: Payer: BLUE CROSS/BLUE SHIELD | Admitting: Internal Medicine

## 2016-02-06 ENCOUNTER — Telehealth: Payer: Self-pay | Admitting: Cardiovascular Disease

## 2016-02-06 ENCOUNTER — Encounter: Payer: Self-pay | Admitting: Cardiovascular Disease

## 2016-02-06 DIAGNOSIS — H539 Unspecified visual disturbance: Secondary | ICD-10-CM

## 2016-02-06 NOTE — Telephone Encounter (Signed)
I do not see order in computer. I checked with pt and she reports this was requested by her eye doctor ([name]) today.  I told pt I would look for fax and follow up with her tomorrow on this.

## 2016-02-06 NOTE — Telephone Encounter (Signed)
Mrs. [name] is calling to see if an order was sent for her to have a carotid artery test done . Please call   Thanks

## 2016-02-07 ENCOUNTER — Other Ambulatory Visit: Payer: Self-pay | Admitting: Cardiovascular Disease

## 2016-02-07 DIAGNOSIS — I739 Peripheral vascular disease, unspecified: Secondary | ICD-10-CM

## 2016-02-07 DIAGNOSIS — I779 Disorder of arteries and arterioles, unspecified: Secondary | ICD-10-CM

## 2016-02-07 DIAGNOSIS — H532 Diplopia: Secondary | ICD-10-CM

## 2016-02-07 NOTE — Telephone Encounter (Signed)
Fax from [name] requesting Dr. Angelena Form order carotid doppler due to pt's recent visual symptoms received in office.  I spoke with pt and scheduled her for carotid dopplers on August 23,2017 at 9:00

## 2016-02-07 NOTE — Telephone Encounter (Signed)
Thanks

## 2016-02-07 NOTE — Telephone Encounter (Signed)
Follow up        Calling to see if we have received the order for a carotid artery ultrasound from Dr [name]?

## 2016-02-08 ENCOUNTER — Ambulatory Visit (HOSPITAL_COMMUNITY)
Admission: RE | Admit: 2016-02-08 | Discharge: 2016-02-08 | Disposition: A | Discharge status: Home / Self Care | Payer: BLUE CROSS/BLUE SHIELD | Source: Ambulatory Visit | Attending: Cardiovascular Disease | Admitting: Cardiovascular Disease

## 2016-02-08 DIAGNOSIS — H539 Unspecified visual disturbance: Secondary | ICD-10-CM | POA: Insufficient documentation

## 2016-02-08 DIAGNOSIS — I6523 Occlusion and stenosis of bilateral carotid arteries: Secondary | ICD-10-CM | POA: Insufficient documentation

## 2016-02-08 DIAGNOSIS — K219 Gastro-esophageal reflux disease without esophagitis: Secondary | ICD-10-CM | POA: Insufficient documentation

## 2016-02-15 ENCOUNTER — Ambulatory Visit (INDEPENDENT_AMBULATORY_CARE_PROVIDER_SITE_OTHER): Payer: BLUE CROSS/BLUE SHIELD | Admitting: Neurology

## 2016-02-15 ENCOUNTER — Encounter: Payer: Self-pay | Admitting: Neurology

## 2016-02-15 VITALS — BP 112/72 | HR 64 | Resp 20 | Ht 62.0 in | Wt 102.0 lb

## 2016-02-15 DIAGNOSIS — H532 Diplopia: Secondary | ICD-10-CM | POA: Insufficient documentation

## 2016-02-15 NOTE — Progress Notes (Signed)
[facility]  Provider:  Dr [name] Referring Provider: Alain [name] [name], MD Primary Care Physician:  [name] Kehr, MD    HPI:  [name] is a 65 y.o. female here as a referral from Dr. Alain [name] for follow up on [name] condition of  benign fasciculation.   05-16-15 [name] reports today that she has not noticed as many fasciculations since she has reduced [name] caffeine and chocolate intake. This is a great result. She had only one health scare. She developed a allergic reaction to a Z-pack. She got quite sick with it nauseated and having hives but this passed. A couple of weeks or months later she also had a bout of poisoning. Soon after she had a routine appointment with [name] ophthalmologist, Dr. Katy [name]. He noted a cotton wool change in one of [name] eyes. He explained that there could be a lot of reasons for developing this and referred the patient back to [name] primary care physician, Dr. Milford Sink [name]. Dr. Alain [name] obtained a long list of metabolic and hematologic laboratory tests, all of which returned normal. She was also seen by Dr. Zadie [name], a retinal ophthalmology specialist. He confirmed a cotton-wool finding Within a month or 2 she had a follow-up appointment with Dr. Katy [name], and the [name] area had resolved. [name] is now 59 - she stated he is still flying.   2015 She has meanwhile reduced or eliminated [name] caffeine intake and noticed a reduction in twitches. [name] results of the EMG and NCV study have helped [name] to relax and fee safe with [name] condition.  She is snoring now, which doesn't bother [name] but [name] spouse. She has noted a feeling of thumping at the right ear and [name] ENT suggested that this may be a manifestation of [name] fasciculations.  [name] parents lives in [place], [place]. Close to [name] sister, [name] father is in a rehab facility.  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    Last visit note :  The patient reports that she  had 2 spells of near-syncope one in May 2014 and one in June of 2014 . She also has a history of migraines with aura, benign fasciculations which have affected also facial muscles, at times presenting like a tic. The benign fasciculations have been present for over 2 decades and are not related to a minor atrophic lateral sclerosis. Pulse of the near-syncope spells but the patient reported had a diplopia component and appeared to have been experienced as a vertical. The patient reports that the first spell was likely related to dehydration but is not quite sure about the second. The spells don't last long and if she takes fluid and fluid they quickly resolve and do not return. She was sent by [name] primary care physician for an MRI of the brain which returned normal. She has been seen by Dr. Katy [name]  for the brief dizziness, loss of vision , diplopia.   She is diagnosed of with macular degeneration early stage , myopia, and ocular migraines.  Vision was 20/25 in both eyes, ocular  pressure was 18 in the right, and 19 mm in the left eye and Dr. Katy [name]  reported that the patient is early cataract formation but can see well. The optic nerves are healthy , but there is a macular drusen in the right eye he recommended ocular vitamins and to continue contact lens use the patient was otherwise found to be in good health ,and he did not impose any restrictions  on driving activities of daily living etc. The detailed report is reviewed here today as is [name] MRI report which was entirely normal.   09-13-2015 Benign fasciculations resolved !  Patient presents now with an ear pain, left side only. Nothing visible on the left ear.  [name] orthodontist noted some TMJ  click.  At the same time she had half sided headaches. These were not neuralgic and have resolved.  The patient does not recall having any zoster outbreaks, she did have some aphtose stomatitis non-herpetic. Dentist found no herpetic lesions and Hpe [name] tested for  zoster- negative.  Since she does have a slight click at the left temporomandibular joint I recommend that she will purchase a baby toothbrush, one of those that are worn on top of the index finger like a thimble, and massages from the inside the area of the TMJ joint, she can use it on the outside as well.  This helps TMJ pain and click to disappear quicker. At this time I'm just happy that [name] symptoms have resolved and I'm happy to see [name] for any recurrent issues or new problems in the future.  02-15-2016, [name] has experienced an isolated event of diplopia that she related to the left eye. She also has a recurrence of migrainous headaches but they're all a has changed. She used to experience fortification lines which is  now associated with a very bright light perception. The visual aura the last 20-30 minutes. A lot of times there is no headache to follow. She experienced the diplopia before she saw [name] ophthalmologist Dr. Katy [name], who then ordered a carotid Doppler study which returned with clinically insignificant results. Today, [name] headache is more like a pressure. She continued allergy shots with Dr. Tiajuana [name]. A few years ago she has experienced vertical diplopia, and MRI in 2014  was negative- ordered by Dr [name] .  She may take 3 Motrin in a week.  I explained that migraines can change to an AURA ONLY state with reaching the 5th decade of life. Many women that experienced severe nausea and headaches associated with menstrual cycles, has later in life the aura  without headaches to follow . Is benign fasciculations there is truly not changed there is no treatment available nor is it necessary. Is important for the patient to stay hydrated and could not get hypoglycemic as these will lead to presyncopal spells as well as an aunt who took and frequency rise in fasciculations, an  asked [name] to restrict caffeine intake.      Review of Systems: Out of a complete 14 system review, the  patient complains of only the following symptoms, and all other reviewed systems are negative. Less twitching . Snoring reported again, she is complaining of nasal drip , eye itching . She is treated by Warren Lacy, MD at [facility]. She inquired about a sleep evaluation. I would like for [name] to use [name] nasal spray at night to see if she would stop mouth breathing which is the main reason for snoring. She reports diplopia, and related this to Dr [name] who in return evaluated [name] carotid artery- negative for significant stenosis.   Social History   Social History  . Marital status: Married    Spouse name: [name]  . Number of children: 0  . Years of education: 14   Occupational History  . 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 [employer]   . [employer]   Social  History Main Topics  . Smoking status: Never Smoker  . Smokeless tobacco: Never Used  . Alcohol use No  . Drug use: No  . Sexual activity: Not on file   Other Topics Concern  . Not on file   Social History Narrative   [employer]  in [place]. Married '90 - marriage is in very good shape '12, No children.  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 Offerred medical services for them if needed (Nov '11)   Patient is married [name]) and lives at home with [name].   Patient is working Administrator, arts work.   Patient has a high school education.   Patient is right-handed.   Patient does not drink any caffeine.          Family History  Problem Relation Age of Onset  . Hyperlipidemia Father   . COPD Father     Smoker, deceased 30-May-2014  . Hyperlipidemia Mother   . Diabetes Neg Hx   . Colon cancer Neg Hx   . Breast cancer Neg Hx   . Coronary artery disease Neg Hx   . Cancer Neg Hx     breast or colon  . Hypertension Father   . Hypertension Mother     Past Medical History:  Diagnosis  Date  . Allergic rhinitis, cause unspecified   . Backache, unspecified   . Benign fasciculation-cramp syndrome ([place]) 12/03/2012    Worsening with  stress, fatigue .  [name] Calculus of kidney   . Conversion disorder   . Cotton wool spots   . Esophageal reflux   . GERD (gastroesophageal reflux disease)   . Hemorrhage of rectum and anus   . Internal hemorrhoids without mention of complication   . Irritable bowel syndrome   . Microscopic hematuria   . Migraine headache with aura   . Mild aortic insufficiency   . Mild tricuspid regurgitation   . Osteopenia   . Other plastic surgery for unacceptable cosmetic appearance    Inj tx fllers/expander  . Personal history of urinary calculi   . Postmenopausal atrophic vaginitis   . Premature atrial contractions   . PVC (premature ventricular contraction)    a. Event monitor 2013.  [name] Unspecified constipation   . Vasovagal syncope     Past Surgical History:  Procedure Laterality Date  . BREAST BIOPSY Right   . Cosmetic Procedures w/Injection therapy    . SKIN CANCER EXCISION Left    thigh  . WISDOM TOOTH EXTRACTION      Current Outpatient Prescriptions  Medication Sig Dispense Refill  . ALREX 0.2 % SUSP Apply 0.2 drops to eye as needed (allergies).     . Botulinum Toxin Type A, Cosm, (BOTOX COSMETIC) 50 UNITS SOLR Inject 4-6 Units into the muscle. 3 month  injection    . conjugated estrogens (PREMARIN) vaginal cream INSERT 1 GRAM VAGINALLY ONCE A WEEK AS DIRECTED 30 g 3  . Cyanocobalamin (VITAMIN B-12 PO) Take 1 tablet by mouth daily.    [name] EPIPEN 2-PAK 0.3 MG/0.3ML SOAJ injection See admin instructions. Reported on 10/08/2015  1  . fexofenadine (ALLEGRA ALLERGY CHILDRENS) 30 MG tablet Take 30 mg by mouth 2 (two) times daily as needed (allergies).    . fluocinonide cream (LIDEX) AB-123456789 % Apply 1 application topically 2 (two) times daily. 90 g 2  . fluticasone (FLONASE) 50 MCG/ACT nasal spray Place 1 spray into both nostrils  as needed for  allergies.   5  . Hyaluronic Acid (RESTYLANE IJ) Inject as directed every 6 (six) months.    . meclizine (ANTIVERT) 25 MG tablet Take 1 tablet (25 mg total) by mouth 3 (three) times daily as needed for dizziness. 30 tablet 0  . Multiple Vitamins-Minerals (ICAPS AREDS 2) CAPS Take 1 capsule by mouth daily. Reported on 10/08/2015    . pantoprazole (PROTONIX) 40 MG tablet Take 1 tablet (40 mg total) by mouth daily. 30 tablet 11  . tretinoin (RETIN-A) 0.05 % cream Apply 1 application topically at bedtime.    . triamcinolone cream (KENALOG) 0.1 % Apply 1 application topically 2 (two) times daily. 30 g 0   No current facility-administered medications for this visit.     Allergies as of 02/15/2016 - Review Complete 02/15/2016  Allergen Reaction Noted  . Codeine Other (See Comments)   . Epinephrine  08/12/2012  . Neomycin  03/08/2015  . Penicillins Hives and Other (See Comments)   . Sulfonamide derivatives Other (See Comments)   . Zithromax [azithromycin] Rash 08/18/2014    Vitals: BP 112/72   Pulse 64   Resp 20   Ht 5\' 2"  (1.575 m)   Wt 102 lb (46.3 kg)   BMI 18.66 kg/m  Last Weight:  Wt Readings from Last 1 Encounters:  02/15/16 102 lb (46.3 kg)   Last Height:   Ht Readings from Last 1 Encounters:  02/15/16 5\' 2"  (1.575 m)   Vision Screening:  Left eye with correction 25/20.  Right eye with correction 25/20. Monovision, contacts.   Physical exam:  General: The patient is awake, alert and appears not in acute distress. The patient is well groomed. Head: Normocephalic, atraumatic. Neck is supple. Mallampati 1- wide open-  neck circumference 12.5 inches, No retrognathia. TMJ click on the right.  Cardiovascular:  Regular rate and rhythm, without  murmurs or carotid bruit, and without distended neck veins.  Respiratory: Lungs are clear to auscultation. Skin:  Without evidence of edema, or rash Trunk: BMI low normal.  Neurologic exam : The patient is awake and alert, oriented to  place and time.   Memory subjective described as intact. There is a normal attention span & concentration ability.  Speech is fluent without  dysarthria, dysphonia or aphasia. Mood and affect are appropriate.  Cranial nerves: Pupils are equal and briskly reactive to light. Extraocular movements  in vertical and horizontal planes intact and without nystagmus.  Hearing to finger rub intact.   Facial sensation intact to fine touch. Facial motor strength is symmetric and tongue and uvula move midline. Motor exam: Normal tone and normal muscle bulk and symmetric normal strength in all extremities. No fasciculations.  Sensory:  Fine touch, pinprick and vibration were normal  in all extremities.  Proprioception is tested in the upper extremities only. This was normal. Coordination: Rapid alternating movements in the fingers/hands isnormal.  Finger-to-nose maneuver tested and normal without evidence of ataxia, dysmetria or tremor. Gait and station: Patient walks without assistive device.  Strength within normal limits. Stance is stable and normal.  Tandem gait isunfragmented. Romberg testing normal. Deep tendon reflexes: in the upper and lower extremities are symmetric and intact.    Assessment:  After physical and neurologic examination, review of laboratory studies, imaging, neurophysiology testing and pre-existing records.  Benign fasciculations resolved !  Patient presents now with an ear pain, left side only. Nothing visible on the left ear.  [name] orthodontist noted some TMJ click.   At  the same time she had half sided headaches. All resolved , see above.   Emersyn Kotarski, MD   CC Dr. Patrecia Pour

## 2016-02-15 NOTE — Patient Instructions (Signed)
Diplopia Diplopia is the condition of having double vision or seeing two of a single object. There are many causes of diplopia. Some are not dangerous and can be easily corrected. Diplopia may also be a symptom of a serious medical problem. There are two types of diplopia.  Monocular diplopia. This is double vision that affects only one eye. Monocular diplopia is often caused by a clouding of the lens in your eye (cataract) or by disruptions in the way that your eye focuses light.  Binocular diplopia. This is double vision that affects both eyes. However, when you shut one eye, the double vision will go away. Binocular diplopia may be more serious. It can be caused by:  Problems with the nerves or muscles that are responsible for eye movement.  Neurologic diseases.  Thyroid problems.  Tumors.  An infection near your eyes.  A stroke. You may need to see a health care provider who specializes in eye conditions (ophthalmologist) or a nerve specialist (neurologist) to find the cause. HOME CARE INSTRUCTIONS  Tell your health care provider about any changes in your vision.  Do not drive or operate heavy machinery if diplopia interferes with your vision.  Keep all follow-up visits as directed by your health care provider. This is important. SEEK MEDICAL CARE IF:  Your diplopia gets worse.  You develop any other symptoms along with your diplopia, such as:  Weakness.  Numbness.  Headache.  Eye pain.  Clumsiness.  Nausea.  Drooping eyelids.  Abnormal movement of one of your eyes. SEEK IMMEDIATE MEDICAL CARE IF:  You have sudden vision loss.  You suddenly get a very bad headache.  You have sudden weakness or numbness.  You suddenly lose the ability to speak, understand speech, or both.   This information is not intended to replace advice given to you by your health care provider. Make sure you discuss any questions you have with your health care provider.   Document  Released: 04/05/2004 Document Revised: 10/19/2014 Document Reviewed: 04/28/2014 Elsevier Interactive Patient Education [employer].

## 2016-02-15 NOTE — Addendum Note (Signed)
Addended by: Larey Seat on: 02/15/2016 02:37 PM   Modules accepted: Orders

## 2016-02-16 ENCOUNTER — Telehealth: Payer: Self-pay | Admitting: Neurology

## 2016-02-16 ENCOUNTER — Telehealth: Payer: Self-pay

## 2016-02-16 LAB — COMPREHENSIVE METABOLIC PANEL
ALBUMIN: 4.7 g/dL (ref 3.6–4.8)
ALT: 14 IU/L (ref 0–32)
AST: 19 IU/L (ref 0–40)
Albumin/Globulin Ratio: 1.9 (ref 1.2–2.2)
Alkaline Phosphatase: 68 IU/L (ref 39–117)
BUN / CREAT RATIO: 28 (ref 12–28)
BUN: 20 mg/dL (ref 8–27)
Bilirubin Total: 0.3 mg/dL (ref 0.0–1.2)
CALCIUM: 10.1 mg/dL (ref 8.7–10.3)
CO2: 26 mmol/L (ref 18–29)
CREATININE: 0.72 mg/dL (ref 0.57–1.00)
Chloride: 102 mmol/L (ref 96–106)
GFR, EST AFRICAN AMERICAN: 103 mL/min/{1.73_m2} (ref >59)
GFR, EST NON AFRICAN AMERICAN: 89 mL/min/{1.73_m2} (ref >59)
GLOBULIN, TOTAL: 2.5 g/dL (ref 1.5–4.5)
Glucose: 102 mg/dL — ABNORMAL HIGH (ref 65–99)
Potassium: 5.7 mmol/L — ABNORMAL HIGH (ref 3.5–5.2)
SODIUM: 142 mmol/L (ref 134–144)
TOTAL PROTEIN: 7.2 g/dL (ref 6.0–8.5)

## 2016-02-16 MED ORDER — ALPRAZOLAM 0.5 MG PO TABS: ORAL_TABLET | ORAL | 0 refills | Status: DC

## 2016-02-16 NOTE — Telephone Encounter (Signed)
I spoke to pt. I advised her that her labs revealed that her potassium level is high, but unexplained, and that we will share these results with Dr. [name]. Pt says that her potassium was high another time but once it was retested, it came back down, and her doctor told her that it was a fluke. Pt asked that I send these results to Dr. [name] and she will discuss with him. Pt verbalized understanding of results. Pt had no questions at this time but was encouraged to call back if questions arise.

## 2016-02-16 NOTE — Telephone Encounter (Signed)
Pt called said she is going to have an open MRI at [facility]. Even though it is open she sts she is very claustrophobic and has been given alprozalam 0.5 the last time she had an MRI. She is wanting to get 1 tablet (she said 2 in case she dropped it). She also needs to know if she can continue exercising now until the MRI is done.  She has botox appt next week with Dr Ahriana Coup, should she wait until after the MRI to have the botox.  Please call

## 2016-02-16 NOTE — Telephone Encounter (Signed)
-----   Message from Larey Seat, MD sent at 02/16/2016  4:02 PM EDT ----- High potassium level, unexplained. Will share with PCP. CD

## 2016-02-16 NOTE — Telephone Encounter (Signed)
I spoke to Dr. Brett Fairy. She is ok with pt continuing to exercise and it is ok for pt wait until after her botox appt to have her MRI. It is also ok for pt to have 2 tablets of xanax 0.5 mg prn for MRI.  I spoke to pt. I advised her of this information. Pt verbalized understanding. Pt asked that I fax the xanax RX to [facility] on [address]. Received a receipt of confirmation.

## 2016-02-16 NOTE — Telephone Encounter (Signed)
May pt have xanax 2 tablets for the MRI?  May pt continue exercising?  Should pt wait until after her botox appt with Dr. Ikeya Coup next week to have her MRI?

## 2016-02-17 ENCOUNTER — Ambulatory Visit (INDEPENDENT_AMBULATORY_CARE_PROVIDER_SITE_OTHER): Payer: BLUE CROSS/BLUE SHIELD | Admitting: Nurse Practitioner

## 2016-02-17 ENCOUNTER — Encounter: Payer: Self-pay | Admitting: Nurse Practitioner

## 2016-02-17 VITALS — BP 112/80 | HR 68 | Temp 98.3°F | Ht 62.0 in | Wt 101.2 lb

## 2016-02-17 DIAGNOSIS — E875 Hyperkalemia: Secondary | ICD-10-CM | POA: Insufficient documentation

## 2016-02-17 NOTE — Patient Instructions (Signed)
Will call patient with lab results.

## 2016-02-17 NOTE — Progress Notes (Signed)
Subjective:  Patient ID: [name], female    DOB: November 21, 1950  Age: 65 y.o. MRN: [id number]  CC: Labs Only (Patient is wanting to have labs redone today to recheck K+ due to elevated levels on 02/15/16. )   [name] presents for lab discussion.  HPI She presents to office with concerns about elevated potassium level that was done 2 days ago his neurologist. She wants to know labs could be repeated. She denies any chest pain, shortness of breath, edema, or palpitations. Denies using any salt additives. No recent use of Gatorade or Pedialyte. Tries to stay hydrated after exercise.  Outpatient Medications Prior to Visit  Medication Sig Dispense Refill  . ALPRAZolam (XANAX) 0.5 MG tablet May take 1 tablet 30 mins prior to MRI and 1 tablet before entering MRI if needed. 2 tablet 0  . ALREX 0.2 % SUSP Apply 0.2 drops to eye as needed (allergies).     . Botulinum Toxin Type A, Cosm, (BOTOX COSMETIC) 50 UNITS SOLR Inject 4-6 Units into the muscle. 3 month  injection    . conjugated estrogens (PREMARIN) vaginal cream INSERT 1 GRAM VAGINALLY ONCE A WEEK AS DIRECTED 30 g 3  . Cyanocobalamin (VITAMIN B-12 PO) Take 1 tablet by mouth daily.    [name] EPIPEN 2-PAK 0.3 MG/0.3ML SOAJ injection See admin instructions. Reported on 10/08/2015  1  . fexofenadine (ALLEGRA ALLERGY CHILDRENS) 30 MG tablet Take 30 mg by mouth 2 (two) times daily as needed (allergies).    . fluocinonide cream (LIDEX) [id number] % Apply 1 application topically 2 (two) times daily. 90 g 2  . fluticasone (FLONASE) 50 MCG/ACT nasal spray Place 1 spray into both nostrils as needed for allergies.   5  . Hyaluronic Acid (RESTYLANE IJ) Inject as directed every 6 (six) months.    . meclizine (ANTIVERT) 25 MG tablet Take 1 tablet (25 mg total) by mouth 3 (three) times daily as needed for dizziness. 30 tablet 0  . Multiple Vitamins-Minerals (ICAPS AREDS 2) CAPS Take 1 capsule by mouth daily. Reported on 10/08/2015    . pantoprazole  (PROTONIX) 40 MG tablet Take 1 tablet (40 mg total) by mouth daily. 30 tablet 11  . tretinoin (RETIN-A) 0.05 % cream Apply 1 application topically at bedtime.    . triamcinolone cream (KENALOG) 0.1 % Apply 1 application topically 2 (two) times daily. 30 g 0   No facility-administered medications prior to visit.     ROS See HPI  Objective:  BP 112/80 (BP Location: Left Arm, Patient Position: Sitting, Cuff Size: Normal)   Pulse 68   Temp 98.3 F (36.8 C) (Oral)   Ht 5\' 2"  (1.575 m)   Wt 101 lb 4 oz (45.9 kg)   SpO2 98%   BMI 18.52 kg/m   BP Readings from Last 3 Encounters:  02/17/16 112/80  02/15/16 112/72  12/28/15 108/70    Wt Readings from Last 3 Encounters:  02/17/16 101 lb 4 oz (45.9 kg)  02/15/16 102 lb (46.3 kg)  12/28/15 102 lb (46.3 kg)    Physical Exam  Constitutional: She is oriented to person, place, and time. She appears well-developed. No distress.  Cardiovascular: Normal rate.   Pulmonary/Chest: Effort normal.  Musculoskeletal: She exhibits no edema.  Neurological: She is alert and oriented to person, place, and time.  Skin: Skin is warm and dry.  Vitals reviewed.   Lab Results  Component Value Date   WBC 4.2 10/04/2015   HGB 13.2 10/04/2015   HCT  38.8 10/04/2015   PLT 179.0 10/04/2015   GLUCOSE 102 (H) 02/15/2016   CHOL 202 (H) 10/04/2015   TRIG 71.0 10/04/2015   HDL 71.20 10/04/2015   LDLCALC 116 (H) 10/04/2015   ALT 14 02/15/2016   AST 19 02/15/2016   NA 142 02/15/2016   K 5.7 (H) 02/15/2016   CL 102 02/15/2016   CREATININE 0.72 02/15/2016   BUN 20 02/15/2016   CO2 26 02/15/2016   TSH 1.84 10/04/2015    No results found.  Assessment & Plan:  Spent 15 minutes with the patient discussing . Possible causes of hyperkalemia (diet, hydration status, medication and decreased kidney function). Reassured patient that current potassium level it's a mild elevation, so needs repeat BMP for now. She has no indication of kidney disease, now is she  on any medication that may increase potassium level.  [name] was seen today for labs only.  Diagnoses and all orders for this visit:  Hyperkalemia -     Basic Metabolic Panel (BMET); Future   I am having Ms. [name] maintain her Botulinum Toxin Type A (Cosm), ALREX, Hyaluronic Acid (RESTYLANE IJ), fluticasone, tretinoin, fluocinonide cream, EPIPEN 2-PAK, triamcinolone cream, Cyanocobalamin (VITAMIN B-12 PO), ICAPS AREDS 2, fexofenadine, conjugated estrogens, meclizine, pantoprazole, and ALPRAZolam.  No orders of the defined types were placed in this encounter.   Follow-up: Return if symptoms worsen or fail to improve.  Wilfred Lacy, NP

## 2016-02-17 NOTE — Assessment & Plan Note (Signed)
We'll repeat  BMP on Tuesday. Reassured patient about normal kidney function.

## 2016-02-17 NOTE — Progress Notes (Signed)
Pre visit review using our clinic review tool, if applicable. No additional management support is needed unless otherwise documented below in the visit note. 

## 2016-02-21 ENCOUNTER — Other Ambulatory Visit (INDEPENDENT_AMBULATORY_CARE_PROVIDER_SITE_OTHER): Payer: BLUE CROSS/BLUE SHIELD

## 2016-02-21 DIAGNOSIS — E875 Hyperkalemia: Secondary | ICD-10-CM | POA: Diagnosis not present

## 2016-02-21 LAB — BASIC METABOLIC PANEL
BUN: 22 mg/dL (ref 6–23)
CHLORIDE: 105 meq/L (ref 96–112)
CO2: 29 meq/L (ref 19–32)
Calcium: 9.1 mg/dL (ref 8.4–10.5)
Creatinine, Ser: 0.9 mg/dL (ref 0.40–1.20)
GFR: 67.14 mL/min (ref >60.00)
GLUCOSE: 80 mg/dL (ref 70–99)
POTASSIUM: 5 meq/L (ref 3.5–5.1)
SODIUM: 139 meq/L (ref 135–145)

## 2016-03-01 ENCOUNTER — Telehealth: Payer: Self-pay | Admitting: Neurology

## 2016-03-01 NOTE — Telephone Encounter (Signed)
Patient called to inquire if insurance has approved MRI, please call to advise.

## 2016-03-05 ENCOUNTER — Ambulatory Visit: Payer: BLUE CROSS/BLUE SHIELD | Admitting: Internal Medicine

## 2016-03-05 ENCOUNTER — Ambulatory Visit (INDEPENDENT_AMBULATORY_CARE_PROVIDER_SITE_OTHER): Payer: BLUE CROSS/BLUE SHIELD | Admitting: Internal Medicine

## 2016-03-05 ENCOUNTER — Encounter: Payer: Self-pay | Admitting: Internal Medicine

## 2016-03-05 DIAGNOSIS — H532 Diplopia: Secondary | ICD-10-CM | POA: Diagnosis not present

## 2016-03-05 DIAGNOSIS — H3581 Retinal edema: Secondary | ICD-10-CM | POA: Diagnosis not present

## 2016-03-05 DIAGNOSIS — E875 Hyperkalemia: Secondary | ICD-10-CM

## 2016-03-05 DIAGNOSIS — F411 Generalized anxiety disorder: Secondary | ICD-10-CM

## 2016-03-05 NOTE — Progress Notes (Signed)
Pre visit review using our clinic review tool, if applicable. No additional management support is needed unless otherwise documented below in the visit note. 

## 2016-03-05 NOTE — Assessment & Plan Note (Signed)
Doing well 

## 2016-03-05 NOTE — Assessment & Plan Note (Signed)
Try Valerian root prn °

## 2016-03-05 NOTE — Assessment & Plan Note (Signed)
MRI was ordered by Dr Dohmeier Pt would like to wait on it a little

## 2016-03-05 NOTE — Assessment & Plan Note (Signed)
Recent K was ok Repeat BMET in 3 mo

## 2016-03-05 NOTE — Progress Notes (Signed)
Subjective:  Patient ID: [name], female    DOB: 22-Jul-1950  Age: 65 y.o. MRN: [id number]  CC: No chief complaint on file.   HPI [name] presents for episodic double vision on 8/2 and 8/21 -- 2 sec long. She saw her ophthalmologist and her neurologist. F/u elev K. C/o being stressed about different things  Outpatient Medications Prior to Visit  Medication Sig Dispense Refill  . ALPRAZolam (XANAX) 0.5 MG tablet May take 1 tablet 30 mins prior to MRI and 1 tablet before entering MRI if needed. 2 tablet 0  . ALREX 0.2 % SUSP Apply 0.2 drops to eye as needed (allergies).     . Botulinum Toxin Type A, Cosm, (BOTOX COSMETIC) 50 UNITS SOLR Inject 4-6 Units into the muscle. 3 month  injection    . conjugated estrogens (PREMARIN) vaginal cream INSERT 1 GRAM VAGINALLY ONCE A WEEK AS DIRECTED 30 g 3  . Cyanocobalamin (VITAMIN B-12 PO) Take 1 tablet by mouth daily.    [name] EPIPEN 2-PAK 0.3 MG/0.3ML SOAJ injection See admin instructions. Reported on 10/08/2015  1  . fexofenadine (ALLEGRA ALLERGY CHILDRENS) 30 MG tablet Take 30 mg by mouth 2 (two) times daily as needed (allergies).    . fluocinonide cream (LIDEX) AB-123456789 % Apply 1 application topically 2 (two) times daily. 90 g 2  . fluticasone (FLONASE) 50 MCG/ACT nasal spray Place 1 spray into both nostrils as needed for allergies.   5  . Hyaluronic Acid (RESTYLANE IJ) Inject as directed every 6 (six) months.    . meclizine (ANTIVERT) 25 MG tablet Take 1 tablet (25 mg total) by mouth 3 (three) times daily as needed for dizziness. 30 tablet 0  . Multiple Vitamins-Minerals (ICAPS AREDS 2) CAPS Take 1 capsule by mouth daily. Reported on 10/08/2015    . pantoprazole (PROTONIX) 40 MG tablet Take 1 tablet (40 mg total) by mouth daily. 30 tablet 11  . tretinoin (RETIN-A) 0.05 % cream Apply 1 application topically at bedtime.    . triamcinolone cream (KENALOG) 0.1 % Apply 1 application topically 2 (two) times daily. 30 g 0   No  facility-administered medications prior to visit.     ROS Review of Systems  Constitutional: Negative for activity change, appetite change, chills, fatigue and unexpected weight change.  HENT: Negative for congestion, mouth sores and sinus pressure.   Eyes: Positive for visual disturbance.  Respiratory: Negative for cough and chest tightness.   Gastrointestinal: Negative for abdominal pain and nausea.  Genitourinary: Negative for difficulty urinating, frequency and vaginal pain.  Musculoskeletal: Negative for back pain and gait problem.  Skin: Negative for pallor and rash.  Neurological: Negative for dizziness, tremors, weakness, numbness and headaches.  Psychiatric/Behavioral: Negative for confusion and sleep disturbance. The patient is nervous/anxious.     Objective:  BP 100/70   Pulse 68   Wt 104 lb (47.2 kg)   SpO2 97%   BMI 19.02 kg/m   BP Readings from Last 3 Encounters:  03/05/16 100/70  02/17/16 112/80  02/15/16 112/72    Wt Readings from Last 3 Encounters:  03/05/16 104 lb (47.2 kg)  02/17/16 101 lb 4 oz (45.9 kg)  02/15/16 102 lb (46.3 kg)    Physical Exam  Constitutional: She appears well-developed. No distress.  HENT:  Head: Normocephalic.  Right Ear: External ear normal.  Left Ear: External ear normal.  Nose: Nose normal.  Mouth/Throat: Oropharynx is clear and moist.  Eyes: Conjunctivae are normal. Pupils are equal, round, and reactive  to light. Right eye exhibits no discharge. Left eye exhibits no discharge.  Neck: Normal [name] of motion. Neck supple. No JVD present. No tracheal deviation present. No thyromegaly present.  Cardiovascular: Normal rate, regular rhythm and normal heart sounds.   Pulmonary/Chest: No stridor. No respiratory distress. She has no wheezes.  Abdominal: Soft. Bowel sounds are normal. She exhibits no distension and no mass. There is no tenderness. There is no rebound and no guarding.  Musculoskeletal: She exhibits no edema or  tenderness.  Lymphadenopathy:    She has no cervical adenopathy.  Neurological: She displays normal reflexes. No cranial nerve deficit. She exhibits normal muscle tone. Coordination normal.  Skin: No rash noted. No erythema.  Psychiatric: She has a normal mood and affect. Her behavior is normal. Judgment and thought content normal.    Lab Results  Component Value Date   WBC 4.2 10/04/2015   HGB 13.2 10/04/2015   HCT 38.8 10/04/2015   PLT 179.0 10/04/2015   GLUCOSE 80 02/21/2016   CHOL 202 (H) 10/04/2015   TRIG 71.0 10/04/2015   HDL 71.20 10/04/2015   LDLCALC 116 (H) 10/04/2015   ALT 14 02/15/2016   AST 19 02/15/2016   NA 139 02/21/2016   K 5.0 02/21/2016   CL 105 02/21/2016   CREATININE 0.90 02/21/2016   BUN 22 02/21/2016   CO2 29 02/21/2016   TSH 1.84 10/04/2015    No results found.  Assessment & Plan:   There are no diagnoses linked to this encounter. I am having Ms. Schraeder maintain her Botulinum Toxin Type A (Cosm), ALREX, Hyaluronic Acid (RESTYLANE IJ), fluticasone, tretinoin, fluocinonide cream, EPIPEN 2-PAK, triamcinolone cream, Cyanocobalamin (VITAMIN B-12 PO), ICAPS AREDS 2, fexofenadine, conjugated estrogens, meclizine, pantoprazole, and ALPRAZolam.  No orders of the defined types were placed in this encounter.    Follow-up: No Follow-up on file.  [name], MD

## 2016-03-05 NOTE — Patient Instructions (Signed)
Valerian root [place]'s wart

## 2016-03-12 ENCOUNTER — Encounter: Payer: Self-pay | Admitting: Neurology

## 2016-03-12 ENCOUNTER — Telehealth: Payer: Self-pay | Admitting: Neurology

## 2016-03-12 NOTE — Telephone Encounter (Signed)
Patient has been sent to [facility].

## 2016-03-12 NOTE — Telephone Encounter (Signed)
I spoke to pt and advised her that I faxed the xanax RX again to [facility] on [address]. Received a receipt of confirmation. Pt said that she will check with them again this afternoon. Pt verbalized understanding.

## 2016-03-12 NOTE — Telephone Encounter (Signed)
Patient called back, states she called earlier this morning and was advised to contact [facility] on [address] for ALPRAZolam Duanne Moron) 0.5 MG tablet, patient states she did contact this pharmacy and they have advised her that they have not received this Rx. Please call [phone].

## 2016-03-14 LAB — HM MAMMOGRAPHY

## 2016-03-15 ENCOUNTER — Encounter: Payer: Self-pay | Admitting: Internal Medicine

## 2016-03-15 ENCOUNTER — Telehealth: Payer: Self-pay | Admitting: Neurology

## 2016-03-15 NOTE — Telephone Encounter (Signed)
Pt called in requesting her MRI order be sent to [facility] for the open MRI. Please call pt to give a status.

## 2016-03-20 ENCOUNTER — Encounter: Payer: Self-pay | Admitting: Internal Medicine

## 2016-03-27 ENCOUNTER — Encounter: Payer: Self-pay | Admitting: Neurology

## 2016-03-27 ENCOUNTER — Ambulatory Visit (INDEPENDENT_AMBULATORY_CARE_PROVIDER_SITE_OTHER): Payer: BLUE CROSS/BLUE SHIELD | Admitting: Neurology

## 2016-03-27 ENCOUNTER — Ambulatory Visit: Payer: BLUE CROSS/BLUE SHIELD | Admitting: Family Medicine

## 2016-03-27 VITALS — BP 104/68 | HR 76 | Resp 20 | Ht 62.0 in | Wt 105.0 lb

## 2016-03-27 DIAGNOSIS — R253 Fasciculation: Secondary | ICD-10-CM

## 2016-03-27 DIAGNOSIS — G709 Myoneural disorder, unspecified: Secondary | ICD-10-CM

## 2016-03-27 NOTE — Progress Notes (Signed)
[facility]  Provider:  Dr Brett Fairy Referring Provider: [name] [name], MD Primary Care Physician:  [name], MD    HPI:  [name] is a 65 y.o. female here as a patient of  Dr. [name] for follow up on her condition of  benign fasciculation.   05-16-15 [name] reports today that she has not noticed as many fasciculations since she has reduced her caffeine and chocolate intake. This is a great result. She had only one health scare. She developed a allergic reaction to a Z-pack. She got quite sick with it nauseated and having hives but this passed. A couple of weeks or months later she also had a bout of poisoning. Soon after she had a routine appointment with her ophthalmologist, [name]. He noted a cotton wool change in one of her eyes. He explained that there could be a lot of reasons for developing this and referred the patient back to her primary care physician, Dr. Acequia Sink Plotnikov. Dr. [name] obtained a long list of metabolic and hematologic laboratory tests, all of which returned normal. She was also seen by Dr. [name], a retinal ophthalmology specialist. He confirmed a cotton-wool finding Within a month or 2 she had a follow-up appointment with [name], and the Cottonwool area had resolved, obviously not scar tissue. [name] Her husband is now 46 - she stated he is still flying.     Last visit note :  The patient reports that she had 2 spells of near-syncope one in May 2014 and one in June of 2014 . She also has a history of migraines with aura, benign fasciculations which have affected also facial muscles, at times presenting like a tic. The benign fasciculations have been present for over 2 decades and are not related to a minor atrophic lateral sclerosis. Pulse of the near-syncope spells but the patient reported had a diplopia component and appeared to have been experienced as a vertical. The patient reports that the first spell was likely  related to dehydration but is not quite sure about the second. The spells don't last long and if she takes fluid and fluid they quickly resolve and do not return. She was sent by her primary care physician for an MRI of the brain which returned normal. She has been seen by [name]  for the brief dizziness, loss of vision , diplopia.   She is diagnosed of with macular degeneration early stage , myopia, and ocular migraines.  Vision was 20/25 in both eyes, ocular  pressure was 18 in the right, and 19 mm in the left eye and [name]  reported that the patient is early cataract formation but can see well. The optic nerves are healthy , but there is a macular drusen in the right eye he recommended ocular vitamins and to continue contact lens use the patient was otherwise found to be in good health ,and he did not impose any restrictions on driving activities of daily living etc. The detailed report is reviewed here today as is her MRI report which was entirely normal. She has meanwhile reduced or eliminated her caffeine intake and noticed a reduction in twitches. Her results of the EMG and NCV study have helped her to relax and fee safe with her condition.  She is snoring now, which doesn't bother her but her spouse. She has noted a feeling of thumping at the right ear and her ENT suggested that this may be a manifestation of her fasciculations.  Her  parents lives in [place], [place]. Close to her sister, her father is in a rehab facility.  Her husband is an Emergency planning/management officer, now with [employer].   09-13-2015 Benign fasciculations resolved !  Patient presents now with an ear pain, left side only. Nothing visible on the left ear.  Her orthodontist noted some TMJ  click.  At the same time she had half sided headaches. These were not neuralgic and have resolved.  The patient does not recall having any zoster outbreaks, she did have some aphtose stomatitis non-herpetic. Dentist found no herpetic lesions and  Hpe [name] tested for zoster- negative.  Since she does have a slight click at the left temporomandibular joint I recommend that she will purchase a baby toothbrush, one of those that are worn on top of the index finger like a thimble, and massages from the inside the area of the TMJ joint, she can use it on the outside as well.  This helps TMJ pain and click to disappear quicker. At this time I'm just happy that her symptoms have resolved and I'm happy to see her for any recurrent issues or new problems in the future.  02-15-2016, [name] has experienced an isolated event of diplopia that she related to the left eye. She also has a recurrence of migrainous headaches but they're all a has changed. She used to experience fortification lines which is  now associated with a very bright light perception. The visual aura the last 20-30 minutes. A lot of times there is no headache to follow. She experienced the diplopia before she saw her ophthalmologist [name], who then ordered a carotid Doppler study which returned with clinically insignificant results. Today, her headache is more like a pressure. She continued allergy shots with [name]. A few years ago she has experienced vertical diplopia, and MRI in 2014  was negative- ordered by Dr [name] .  She may take 3 Motrin in a week.  I explained that migraines can change to this state with reaching the 5th decade of life. Many women that experienced severe nausea and headaches associated with menstrual cycles, has later in life the aura  without headaches to follow . Is benign fasciculations there is truly not changed there is no treatment available nor is it necessary. Is important for the patient to stay hydrated and could not get hypoglycemic as these will lead to presyncopal spells as well as an aunt who took and frequency rise in fasciculations, an  asked her to restrict caffeine intake.  03-27-2016,  patient reports resolution of headaches  after quitting chocolate covered almonds ! No more diplopia , rare fasciculation. All tests were normal.        Review of Systems: Out of a complete 14 system review, the patient complains of only the following symptoms, and all other reviewed systems are negative. Less twitching . Snoring reported again, she is complaining of nasal drip , eye itching . She is treated by Warren Lacy, MD at [facility] allergy. She inquired about a sleep evaluation. I would like for her to use her nasal spray at night to see if she would stop mouth breathing which is the main reason for snoring. She reports diplopia, and related this to Dr [name] who in return evaluated her carotid artery- negative for significant stenosis.   Social History   Social History  . Marital status: Married    Spouse name: [name]  . Number of children: 0  . Years of education:  14   Occupational History  . 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 [employer]   . [employer]ICE [employer]   Social History Main Topics  . Smoking status: Never Smoker  . Smokeless tobacco: Never Used  . Alcohol use No  . Drug use: No  . Sexual activity: Not on file   Other Topics Concern  . Not on file   Social History Narrative   [employer]  in [place]. Married '90 - marriage is in very good shape '12, No children.  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 Offerred medical services for them if needed (Nov '11)   Patient is married [name]) and lives at home with her husband.   Patient is working Administrator, arts work.   Patient has a high school education.   Patient is right-handed.   Patient does not drink any caffeine.          Family History  Problem Relation Age of Onset  . Hyperlipidemia Father   . COPD Father     Smoker, deceased 06-01-2014  . Hyperlipidemia Mother   . Diabetes Neg Hx   . Colon cancer Neg Hx   . Breast  cancer Neg Hx   . Coronary artery disease Neg Hx   . Cancer Neg Hx     breast or colon  . Hypertension Father   . Hypertension Mother     Past Medical History:  Diagnosis Date  . Allergic rhinitis, cause unspecified   . Backache, unspecified   . Benign fasciculation-cramp syndrome ([place]) 12/03/2012    Worsening with  stress, fatigue .  [name] Calculus of kidney   . Conversion disorder   . Cotton wool spots   . Esophageal reflux   . GERD (gastroesophageal reflux disease)   . Hemorrhage of rectum and anus   . Internal hemorrhoids without mention of complication   . Irritable bowel syndrome   . Microscopic hematuria   . Migraine headache with aura   . Mild aortic insufficiency   . Mild tricuspid regurgitation   . Osteopenia   . Other plastic surgery for unacceptable cosmetic appearance    Inj tx fllers/expander  . Personal history of urinary calculi   . Postmenopausal atrophic vaginitis   . Premature atrial contractions   . PVC (premature ventricular contraction)    a. Event monitor 2013.  [name] Unspecified constipation   . Vasovagal syncope     Past Surgical History:  Procedure Laterality Date  . BREAST BIOPSY Right   . Cosmetic Procedures w/Injection therapy    . SKIN CANCER EXCISION Left    thigh  . WISDOM TOOTH EXTRACTION      Current Outpatient Prescriptions  Medication Sig Dispense Refill  . ALPRAZolam (XANAX) 0.5 MG tablet May take 1 tablet 30 mins prior to MRI and 1 tablet before entering MRI if needed. 2 tablet 0  . ALREX 0.2 % SUSP Apply 0.2 drops to eye as needed (allergies).     . Botulinum Toxin Type A, Cosm, (BOTOX COSMETIC) 50 UNITS SOLR Inject 4-6 Units into the muscle. 3 month  injection    . conjugated estrogens (PREMARIN) vaginal cream INSERT 1 GRAM VAGINALLY ONCE A WEEK AS DIRECTED 30 g 3  . Cyanocobalamin (VITAMIN B-12 PO) Take 1 tablet by mouth daily.    [name] EPIPEN 2-PAK 0.3 MG/0.3ML SOAJ injection See admin instructions. Reported on 10/08/2015  1  .  fexofenadine (ALLEGRA ALLERGY CHILDRENS) 30 MG tablet Take 30 mg by mouth 2 (two) times daily as needed (allergies).    . fluocinonide cream (LIDEX) AB-123456789 % Apply 1 application topically 2 (two) times daily. 90 g 2  . fluticasone (FLONASE) 50 MCG/ACT nasal spray Place 1 spray into both nostrils as needed for allergies.   5  . Hyaluronic Acid (RESTYLANE IJ) Inject as directed every 6 (six) months.    . meclizine (ANTIVERT) 25 MG tablet Take 1 tablet (25 mg total) by mouth 3 (three) times daily as needed for dizziness. 30 tablet 0  . Multiple Vitamins-Minerals (ICAPS AREDS 2) CAPS Take 1 capsule by mouth daily. Reported on 10/08/2015    . pantoprazole (PROTONIX) 40 MG tablet Take 1 tablet (40 mg total) by mouth daily. 30 tablet 11  . tretinoin (RETIN-A) 0.05 % cream Apply 1 application topically at bedtime.    . triamcinolone cream (KENALOG) 0.1 % Apply 1 application topically 2 (two) times daily. 30 g 0   No current facility-administered medications for this visit.     Allergies as of 03/27/2016 - Review Complete 03/27/2016  Allergen Reaction Noted  . Codeine Other (See Comments)   . Epinephrine  08/12/2012  . Neomycin  03/08/2015  . Penicillins Hives and Other (See Comments)   . Sulfonamide derivatives Other (See Comments)   . Zithromax [azithromycin] Rash 08/18/2014    Vitals: BP 104/68   Pulse 76   Resp 20   Ht 5\' 2"  (1.575 m)   Wt 105 lb (47.6 kg)   BMI 19.20 kg/m  Last Weight:  Wt Readings from Last 1 Encounters:  03/27/16 105 lb (47.6 kg)   Last Height:   Ht Readings from Last 1 Encounters:  03/27/16 5\' 2"  (1.575 m)   Vision Screening:  Left eye with correction 25/20.  Right eye with correction 25/20. Monovision, contacts.   Physical exam:  General: The patient is awake, alert and appears not in acute distress. The patient is well groomed. Head: Normocephalic, atraumatic. Neck is supple. Mallampati 1- wide open-  neck circumference 12.5 inches, No retrognathia. TMJ  click on the right.  Cardiovascular:  Regular rate and rhythm, without  murmurs or carotid bruit, and without distended neck veins.  Respiratory: Lungs are clear to auscultation. Skin:  Without evidence of edema, or rash Trunk: BMI low normal.  Neurologic exam : The patient is awake and alert, oriented to place and time.   Memory subjective described as intact. There is a normal attention span & concentration ability.  Speech is fluent without  dysarthria, dysphonia or aphasia. Mood and affect are appropriate.  Cranial nerves: Pupils are equal and briskly reactive to light. Extraocular movements  in vertical and horizontal planes intact and without nystagmus.  Hearing to finger rub intact.   Facial sensation intact to fine touch. Facial motor strength is symmetric and tongue and uvula move midline. Motor exam: Normal tone and normal muscle bulk and symmetric normal strength in all extremities. No fasciculations.  Sensory:  Fine touch, pinprick and vibration were normal  in all extremities.  Proprioception is tested in the upper extremities only. This was normal. Coordination: Rapid alternating movements in the fingers/hands isnormal.  Finger-to-nose maneuver tested and normal without evidence of ataxia, dysmetria or tremor. Gait and station: Patient walks without assistive device.  Strength within normal limits. Stance is stable and normal.  Tandem gait isunfragmented. Romberg testing normal. Deep tendon reflexes: in the upper and lower extremities are symmetric and intact.  Assessment:  After physical and neurologic examination, review of laboratory studies, imaging, neurophysiology testing and pre-existing records.  Benign fasciculations resolved !  Her orthodontist noted some TMJ click.  Headaches resolved since August after she stopped eating chocolate covered almonds.  Diplopia horizontal, related to stress situations, no repeat since.   Yearly follow up next October.     Avion Kutzer, MD   CC Dr. Patrecia Pour

## 2016-03-28 NOTE — Telephone Encounter (Signed)
Late Entry: Emily faxed orders over to [facility] (please refer to order notes)

## 2016-04-09 ENCOUNTER — Ambulatory Visit (INDEPENDENT_AMBULATORY_CARE_PROVIDER_SITE_OTHER): Payer: BLUE CROSS/BLUE SHIELD

## 2016-04-09 DIAGNOSIS — Z23 Encounter for immunization: Secondary | ICD-10-CM | POA: Diagnosis not present

## 2016-04-10 ENCOUNTER — Telehealth: Payer: Self-pay | Admitting: Internal Medicine

## 2016-04-10 DIAGNOSIS — R300 Dysuria: Secondary | ICD-10-CM

## 2016-04-10 NOTE — Telephone Encounter (Signed)
Ok UA Thx 

## 2016-04-10 NOTE — Telephone Encounter (Signed)
Patient is requesting for urinalysis to be placed for possible UTI

## 2016-04-11 ENCOUNTER — Other Ambulatory Visit (INDEPENDENT_AMBULATORY_CARE_PROVIDER_SITE_OTHER): Payer: BLUE CROSS/BLUE SHIELD

## 2016-04-11 ENCOUNTER — Telehealth: Payer: Self-pay | Admitting: Internal Medicine

## 2016-04-11 DIAGNOSIS — R300 Dysuria: Secondary | ICD-10-CM

## 2016-04-11 LAB — URINALYSIS, ROUTINE W REFLEX MICROSCOPIC
Bilirubin Urine: NEGATIVE
Ketones, ur: NEGATIVE
Leukocytes, UA: NEGATIVE
Nitrite: NEGATIVE
PH: 6 (ref 5.0–8.0)
SPECIFIC GRAVITY, URINE: 1.01 (ref 1.000–1.030)
Total Protein, Urine: NEGATIVE
Urine Glucose: NEGATIVE
Urobilinogen, UA: 0.2 (ref 0.0–1.0)

## 2016-04-11 NOTE — Telephone Encounter (Signed)
Left patient vm to call back to schedule appt  °

## 2016-04-11 NOTE — Telephone Encounter (Signed)
[facility]  Patient Name: [name]  DOB: 1950-08-07    Initial Comment Caller states got flu shot Mon, it's very red, warm to touch, it's bumped up a bit    Nurse Assessment  Nurse: Wayne Sever, RN, Tillie Rung Date/Time (Eastern Time): 04/11/2016 9:07:46 AM  Confirm and document reason for call. If symptomatic, describe symptoms. You must click the next button to save text entered. ---Caller states she got a flu shot Monday and it's red and bumped up. She states it's a little warm to the touch.  Has the patient traveled out of the country within the last 30 days? ---Not Applicable  Does the patient have any new or worsening symptoms? ---Yes  Will a triage be completed? ---Yes  Related visit to physician within the last 2 weeks? ---No  Does the PT have any chronic conditions? (i.e. diabetes, asthma, etc.) ---No  Is this a behavioral health or substance abuse call? ---No     Guidelines    Guideline Title Affirmed Question Affirmed Notes  Immunization Reactions Injection site reaction to any vaccine (all triage questions negative)    Final Disposition User   [place], RN, Tillie Rung    Disagree/Comply: Comply

## 2016-04-11 NOTE — Telephone Encounter (Signed)
Order placed. Left detailed mess informing pt.

## 2016-04-11 NOTE — Telephone Encounter (Signed)
Do we have an opening if pt wants to be seen? If so, will you contact to have site looked at?

## 2016-04-12 ENCOUNTER — Encounter: Payer: Self-pay | Admitting: Nurse Practitioner

## 2016-04-12 ENCOUNTER — Ambulatory Visit (INDEPENDENT_AMBULATORY_CARE_PROVIDER_SITE_OTHER): Payer: BLUE CROSS/BLUE SHIELD | Admitting: Nurse Practitioner

## 2016-04-12 VITALS — BP 112/88 | HR 86 | Temp 98.4°F | Ht 62.0 in | Wt 105.0 lb

## 2016-04-12 DIAGNOSIS — T50Z95A Adverse effect of other vaccines and biological substances, initial encounter: Secondary | ICD-10-CM | POA: Diagnosis not present

## 2016-04-12 NOTE — Progress Notes (Signed)
Subjective:  Patient ID: [name] [name], female    DOB: [birth year]  Age: 65 y.o. MRN: [id number]  CC: Medication Reaction (Pt stated have reaction on the flu shot and have redness)   HPI [name] [name] developed redness and tenderness and swelling at injection site 24 hours after receiving influenza vaccine.(Left deltoid). She denies any fever, arm swelling, numbness or tingling, muscle weakness, or decreased joint [name] [name]. She denies any allergy to latex or eggs.  Outpatient Medications Prior to Visit  Medication Sig Dispense Refill  . ALPRAZolam (XANAX) 0.5 MG tablet May take 1 tablet 30 mins prior to MRI and 1 tablet before entering MRI if needed. 2 tablet 0  . ALREX 0.2 % SUSP Apply 0.2 drops to eye as needed (allergies).     . Botulinum Toxin Type A, Cosm, (BOTOX COSMETIC) 50 UNITS SOLR Inject 4-6 Units into the muscle. 3 month  injection    . conjugated estrogens (PREMARIN) vaginal cream INSERT 1 GRAM VAGINALLY ONCE A WEEK AS DIRECTED 30 g 3  . Cyanocobalamin (VITAMIN B-12 PO) Take 1 tablet by mouth daily.    [name] EPIPEN 2-PAK 0.3 MG/0.3ML SOAJ injection See admin instructions. Reported on 10/08/2015  1  . fexofenadine (ALLEGRA ALLERGY CHILDRENS) 30 MG tablet Take 30 mg by mouth 2 (two) times daily as needed (allergies).    . fluocinonide cream (LIDEX) AB-123456789 % Apply 1 application topically 2 (two) times daily. 90 g 2  . fluticasone (FLONASE) 50 MCG/ACT nasal spray Place 1 spray into both nostrils as needed for allergies.   5  . Hyaluronic Acid (RESTYLANE IJ) Inject as directed every 6 (six) months.    . meclizine (ANTIVERT) 25 MG tablet Take 1 tablet (25 mg total) by mouth 3 (three) times daily as needed for dizziness. 30 tablet 0  . Multiple Vitamins-Minerals (ICAPS AREDS 2) CAPS Take 1 capsule by mouth daily. Reported on 10/08/2015    . pantoprazole (PROTONIX) 40 MG tablet Take 1 tablet (40 mg total) by mouth daily. 30 tablet 11  . tretinoin (RETIN-A) 0.05 % cream Apply 1  application topically at bedtime.    . triamcinolone cream (KENALOG) 0.1 % Apply 1 application topically 2 (two) times daily. 30 g 0   No facility-administered medications prior to visit.     ROS See HPI  Objective:  BP 112/88 (BP Location: Left Arm, Patient Position: Sitting, Cuff Size: Normal)   Pulse 86   Temp 98.4 F (36.9 C)   Ht 5\' 2"  (1.575 m)   Wt 105 lb (47.6 kg)   BMI 19.20 kg/m   BP Readings from Last 3 Encounters:  04/12/16 112/88  03/27/16 104/68  03/05/16 100/70    Wt Readings from Last 3 Encounters:  04/12/16 105 lb (47.6 kg)  03/27/16 105 lb (47.6 kg)  03/05/16 104 lb (47.2 kg)    Physical Exam  Constitutional: No distress.  Neck: Normal [name] [name]. Neck supple.  Cardiovascular: Normal rate.   Pulmonary/Chest: Effort normal.  Musculoskeletal: Normal [name] [name]. She exhibits no edema or tenderness.  Normal radial pulse. Normal [name] [name] of left shoulder, elbow and wrist.  Lymphadenopathy:    She has no cervical adenopathy.  Skin: Skin is warm and dry. No rash noted. There is erythema.  Palpable subcutaneous nodule with Mild surrounding erythema on left deltoid (approximately 5 cm circumference). No increased warmth. No induration. No tenderness.  Psychiatric: She has a normal mood and affect. Her behavior is normal.  Vitals reviewed.  Lab Results  Component Value Date   WBC 4.2 10/04/2015   HGB 13.2 10/04/2015   HCT 38.8 10/04/2015   PLT 179.0 10/04/2015   GLUCOSE 80 02/21/2016   CHOL 202 (H) 10/04/2015   TRIG 71.0 10/04/2015   HDL 71.20 10/04/2015   LDLCALC 116 (H) 10/04/2015   ALT 14 02/15/2016   AST 19 02/15/2016   NA 139 02/21/2016   K 5.0 02/21/2016   CL 105 02/21/2016   CREATININE 0.90 02/21/2016   BUN 22 02/21/2016   CO2 29 02/21/2016   TSH 1.84 10/04/2015    No results found.  Assessment & Plan:   [name] [name] was seen today for medication reaction.  Diagnoses and all orders for this visit:  Vaccine  reaction, initial encounter   I am having [name] [name] maintain her Botulinum Toxin Type A (Cosm), ALREX, Hyaluronic Acid (RESTYLANE IJ), fluticasone, tretinoin, fluocinonide cream, EPIPEN 2-PAK, triamcinolone cream, Cyanocobalamin (VITAMIN B-12 PO), ICAPS AREDS 2, fexofenadine, conjugated estrogens, meclizine, pantoprazole, and ALPRAZolam.  No orders of the defined types were placed in this encounter.   Follow-up: No Follow-up on file.  [name] Lacy, NP

## 2016-04-12 NOTE — Patient Instructions (Addendum)
Patient advised about the signs of cellulitis. She is to call office if persistent redness in one week. Continue use of cold compresses and ibuprofen as needed.  Consider use of oral antibiotics to treat cellulitis is no improvement in one week in the absence of compartment syndrome.

## 2016-04-12 NOTE — Progress Notes (Signed)
Pre visit review using our clinic review tool, if applicable. No additional management support is needed unless otherwise documented below in the visit note. 

## 2016-04-13 ENCOUNTER — Encounter: Payer: Self-pay | Admitting: Family Medicine

## 2016-04-13 ENCOUNTER — Ambulatory Visit (INDEPENDENT_AMBULATORY_CARE_PROVIDER_SITE_OTHER): Payer: BLUE CROSS/BLUE SHIELD | Admitting: Family Medicine

## 2016-04-13 VITALS — BP 100/70 | HR 75 | Temp 98.6°F | Resp 12 | Ht 62.0 in | Wt 104.4 lb

## 2016-04-13 DIAGNOSIS — M542 Cervicalgia: Secondary | ICD-10-CM

## 2016-04-13 DIAGNOSIS — T8089XD Other complications following infusion, transfusion and therapeutic injection, subsequent encounter: Secondary | ICD-10-CM | POA: Diagnosis not present

## 2016-04-13 NOTE — Progress Notes (Signed)
HPI:  ACUTE VISIT:  Chief Complaint  Patient presents with  . skin reaction    [name] is a 65 y.o. female, who is here today complaining of persistent pain on right arm at influenza vaccine site.  She received flu vaccine on 0000000, no complications reported during the procedure. She has had "soreness" on injection site, exacerbated by palpation and right shoulder movement. Alleviated by rest and OTC Ibuprofen. Pain is described as sore, intermittent, max 5/10.  She also had erythema, which has resolved, last noted yesterday. Pain seems to be radiated to right aspect of her neck, she states that she has history of cervical pain.  She states that she has had flu vaccines every year and usually she feels achy for a few days but she feels like this time is lasting longer than usual.  She hasn't noted any fever, chills, odynophagia, dysphagia, cough, wheezing,chest pain,dyspnea, abdominal pain, nausea vomiting, diarrhea, numbness or focal weakness.  She denies limitation of right shoulder range of motion.    Review of Systems  Constitutional: Negative for appetite change, chills, fatigue and fever.  HENT: Negative for mouth sores, sore throat, trouble swallowing and voice change.   Respiratory: Negative for cough, shortness of breath, wheezing and stridor.   Cardiovascular: Negative for chest pain, palpitations and leg swelling.  Gastrointestinal: Negative for abdominal pain, diarrhea, nausea and vomiting.  Musculoskeletal: Positive for myalgias and neck pain. Negative for arthralgias and joint swelling.  Skin: Negative for rash and wound.  Neurological: Negative for syncope, weakness, numbness and headaches.  Hematological: Negative for adenopathy. Does not bruise/bleed easily.  Psychiatric/Behavioral: Negative for confusion. The patient is nervous/anxious.       Current Outpatient Prescriptions on File Prior to Visit  Medication Sig Dispense Refill    . ALPRAZolam (XANAX) 0.5 MG tablet May take 1 tablet 30 mins prior to MRI and 1 tablet before entering MRI if needed. 2 tablet 0  . ALREX 0.2 % SUSP Apply 0.2 drops to eye as needed (allergies).     . Botulinum Toxin Type A, Cosm, (BOTOX COSMETIC) 50 UNITS SOLR Inject 4-6 Units into the muscle. 3 month  injection    . conjugated estrogens (PREMARIN) vaginal cream INSERT 1 GRAM VAGINALLY ONCE A WEEK AS DIRECTED 30 g 3  . Cyanocobalamin (VITAMIN B-12 PO) Take 1 tablet by mouth daily.    [name] EPIPEN 2-PAK 0.3 MG/0.3ML SOAJ injection See admin instructions. Reported on 10/08/2015  1  . fexofenadine (ALLEGRA ALLERGY CHILDRENS) 30 MG tablet Take 30 mg by mouth 2 (two) times daily as needed (allergies).    . fluocinonide cream (LIDEX) AB-123456789 % Apply 1 application topically 2 (two) times daily. 90 g 2  . fluticasone (FLONASE) 50 MCG/ACT nasal spray Place 1 spray into both nostrils as needed for allergies.   5  . Hyaluronic Acid (RESTYLANE IJ) Inject as directed every 6 (six) months.    . meclizine (ANTIVERT) 25 MG tablet Take 1 tablet (25 mg total) by mouth 3 (three) times daily as needed for dizziness. 30 tablet 0  . Multiple Vitamins-Minerals (ICAPS AREDS 2) CAPS Take 1 capsule by mouth daily. Reported on 10/08/2015    . pantoprazole (PROTONIX) 40 MG tablet Take 1 tablet (40 mg total) by mouth daily. 30 tablet 11  . tretinoin (RETIN-A) 0.05 % cream Apply 1 application topically at bedtime.    . triamcinolone cream (KENALOG) 0.1 % Apply 1 application topically 2 (two) times daily. 30 g 0  No current facility-administered medications on file prior to visit.      Past Medical History:  Diagnosis Date  . Allergic rhinitis, cause unspecified   . Backache, unspecified   . Benign fasciculation-cramp syndrome ([place]) 12/03/2012    Worsening with  stress, fatigue .  [name] Calculus of kidney   . Conversion disorder   . Cotton wool spots   . Esophageal reflux   . GERD (gastroesophageal reflux disease)   .  Hemorrhage of rectum and anus   . Internal hemorrhoids without mention of complication   . Irritable bowel syndrome   . Microscopic hematuria   . Migraine headache with aura   . Mild aortic insufficiency   . Mild tricuspid regurgitation   . Osteopenia   . Other plastic surgery for unacceptable cosmetic appearance    Inj tx fllers/expander  . Personal history of urinary calculi   . Postmenopausal atrophic vaginitis   . Premature atrial contractions   . PVC (premature ventricular contraction)    a. Event monitor 2013.  [name] Unspecified constipation   . Vasovagal syncope    Allergies  Allergen Reactions  . Codeine Other (See Comments)    Doesn't remeber  . Epinephrine     Heart racing   . Neomycin     Topical rash from cream  . Penicillins Hives and Other (See Comments)    Has patient had a PCN reaction causing immediate rash, facial/tongue/throat swelling, SOB or lightheadedness with hypotension: Yes Has patient had a PCN reaction causing severe rash involving mucus membranes or skin necrosis: Yes Has patient had a PCN reaction that required hospitalization No Has patient had a PCN reaction occurring within the last 10 years: No If all of the above answers are "NO", then may proceed with Cephalosporin use.   . Sulfonamide Derivatives Other (See Comments)    Doesn't remember   . Zithromax [Azithromycin] Rash    Social History   Social History  . Marital status: Married    Spouse name: [name]  . Number of children: 0  . Years of education: 14   Occupational History  . 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 [employer]   . [employer]ICE [employer]   Social History Main Topics  . Smoking status: Never Smoker  . Smokeless tobacco: Never Used  . Alcohol use No  . Drug use: No  . Sexual activity: Not Asked   Other Topics Concern  . 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 Offerred medical services for them if needed (Nov '11)   Patient is married [name]) and lives at home with her husband.   Patient is working Administrator, arts work.   Patient has a high school education.   Patient is right-handed.   Patient does not drink any caffeine.          Vitals:   04/13/16 0927  BP: 100/70  Pulse: 75  Resp: 12  Temp: 98.6 F (37 C)     Body mass index is 19.09 kg/m.   Physical Exam  Nursing note and vitals reviewed. Constitutional: She is oriented to person, place, and time. She appears well-developed. No distress.  HENT:  Head: Atraumatic.  Mouth/Throat: Oropharynx is clear and moist and mucous membranes are  normal.  Eyes: Conjunctivae are normal.  Cardiovascular:  Pulses:      Radial pulses are 2+ on the right side.  Respiratory: Effort normal and breath sounds normal. No respiratory distress.  Musculoskeletal: She exhibits no edema.  Tenderness upon palpation of right trapezium, minimal limitation rotation, normal flexion and extension. Shoulders normal ROM, no pain elicited.  Lymphadenopathy:    She has no cervical adenopathy.  Neurological: She is alert and oriented to person, place, and time. She has normal strength. No sensory deficit. Coordination and gait normal.  Skin: Skin is warm. No ecchymosis and no rash noted. No erythema.  Injection site mild hyperpigmentation, no induration, erythema, or local heat. Mild tenderness with palpation.  Psychiatric: Her speech is normal. Her mood appears anxious.  Well groomed, good eye contact.      ASSESSMENT AND PLAN:     Burlie was seen today for skin reaction.  Diagnoses and all orders for this visit:  Irritation of injection site, subsequent encounter  Reassured. It seems to be improving, minimal post inflammatory changes on affected area  (hyperpigmentation) but no erythema or induration. We discussed a few side effects of vaccination, usually benign and self-limited. She can continue monitoring for now, instructed about warning signs. Follow-up as needed.   Cervicalgia  It does not seem to be related to influenza vaccine, she is reporting history of cervical pain. Local heat and ROM exercises may help. Follow-up with PCP as needed.      Return if symptoms worsen or fail to improve.     -Ms.Paree Morano was advised to return or notify a doctor immediately if symptoms worsen or persist or new concerns arise, she voices understanding.       Zvi Duplantis G. [place], MD  [facility]. [place] office.

## 2016-04-13 NOTE — Patient Instructions (Addendum)
  [name] I have seen you today for an acute visit.  1. Irritation of injection site, subsequent encounter   I don't think it is concerning today, no signs of infection. Continue monitoring.   Neck pain seems to be related to chronic problem.   In general please monitor for signs of worsening symptoms and seek immediate medical attention if any concerning/warning symptom as we discussed.   If symptoms are not resolved in 7-10 days you should schedule a follow up appointment with your doctor, before if needed.  Please be sure you have an appointment already scheduled with your PCP before you leave today.

## 2016-04-13 NOTE — Progress Notes (Signed)
Pre visit review using our clinic review tool, if applicable. No additional management support is needed unless otherwise documented below in the visit note. 

## 2016-04-17 ENCOUNTER — Ambulatory Visit: Payer: BLUE CROSS/BLUE SHIELD

## 2016-05-15 ENCOUNTER — Ambulatory Visit: Payer: BLUE CROSS/BLUE SHIELD | Admitting: Neurology

## 2016-05-18 DIAGNOSIS — H43392 Other vitreous opacities, left eye: Secondary | ICD-10-CM

## 2016-05-18 HISTORY — DX: Other vitreous opacities, left eye: H43.392

## 2016-05-25 ENCOUNTER — Ambulatory Visit (INDEPENDENT_AMBULATORY_CARE_PROVIDER_SITE_OTHER): Payer: BLUE CROSS/BLUE SHIELD | Admitting: Internal Medicine

## 2016-05-25 ENCOUNTER — Encounter: Payer: Self-pay | Admitting: Internal Medicine

## 2016-05-25 DIAGNOSIS — L989 Disorder of the skin and subcutaneous tissue, unspecified: Secondary | ICD-10-CM

## 2016-05-25 MED ORDER — TRIAMCINOLONE ACETONIDE 0.1 % EX CREA: 1.0000 "application " | TOPICAL_CREAM | Freq: Three times a day (TID) | CUTANEOUS | 0 refills | Status: DC

## 2016-05-25 MED ORDER — DOXYCYCLINE HYCLATE 100 MG PO TABS: 100.0000 mg | ORAL_TABLET | Freq: Two times a day (BID) | ORAL | 0 refills | Status: DC

## 2016-05-25 NOTE — Progress Notes (Signed)
Subjective:  Patient ID: [name], female    DOB: 11/10/1950  Age: 65 y.o. MRN: [id number]  CC: Insect Bite (x 3 days ago Right calf. Itchy and sore to touch)   HPI [name] presents for a painful spot on the R leg   Outpatient Medications Prior to Visit  Medication Sig Dispense Refill  . ALPRAZolam (XANAX) 0.5 MG tablet May take 1 tablet 30 mins prior to MRI and 1 tablet before entering MRI if needed. 2 tablet 0  . ALREX 0.2 % SUSP Apply 0.2 drops to eye as needed (allergies).     . Botulinum Toxin Type A, Cosm, (BOTOX COSMETIC) 50 UNITS SOLR Inject 4-6 Units into the muscle. 3 month  injection    . conjugated estrogens (PREMARIN) vaginal cream INSERT 1 GRAM VAGINALLY ONCE A WEEK AS DIRECTED 30 g 3  . Cyanocobalamin (VITAMIN B-12 PO) Take 1 tablet by mouth daily.    [name] EPIPEN 2-PAK 0.3 MG/0.3ML SOAJ injection See admin instructions. Reported on 10/08/2015  1  . fexofenadine (ALLEGRA ALLERGY CHILDRENS) 30 MG tablet Take 30 mg by mouth 2 (two) times daily as needed (allergies).    . fluocinonide cream (LIDEX) AB-123456789 % Apply 1 application topically 2 (two) times daily. 90 g 2  . fluticasone (FLONASE) 50 MCG/ACT nasal spray Place 1 spray into both nostrils as needed for allergies.   5  . Hyaluronic Acid (RESTYLANE IJ) Inject as directed every 6 (six) months.    . meclizine (ANTIVERT) 25 MG tablet Take 1 tablet (25 mg total) by mouth 3 (three) times daily as needed for dizziness. 30 tablet 0  . Multiple Vitamins-Minerals (ICAPS AREDS 2) CAPS Take 1 capsule by mouth daily. Reported on 10/08/2015    . pantoprazole (PROTONIX) 40 MG tablet Take 1 tablet (40 mg total) by mouth daily. 30 tablet 11  . tretinoin (RETIN-A) 0.05 % cream Apply 1 application topically at bedtime.    . triamcinolone cream (KENALOG) 0.1 % Apply 1 application topically 2 (two) times daily. 30 g 0   No facility-administered medications prior to visit.     ROS Review of Systems  Constitutional: Negative  for activity change, appetite change, chills, fatigue and unexpected weight change.  HENT: Negative for congestion, mouth sores and sinus pressure.   Eyes: Negative for visual disturbance.  Respiratory: Negative for cough and chest tightness.   Gastrointestinal: Negative for abdominal pain and nausea.  Genitourinary: Negative for difficulty urinating, frequency and vaginal pain.  Musculoskeletal: Positive for arthralgias. Negative for back pain and gait problem.  Skin: Negative for pallor and rash.  Neurological: Negative for dizziness, tremors, weakness, numbness and headaches.  Psychiatric/Behavioral: Negative for confusion and sleep disturbance.    Objective:  BP 110/78   Pulse 81   Temp 98.5 F (36.9 C) (Oral)   Wt 105 lb (47.6 kg)   SpO2 97%   BMI 19.20 kg/m   BP Readings from Last 3 Encounters:  05/25/16 110/78  04/13/16 100/70  04/12/16 112/88    Wt Readings from Last 3 Encounters:  05/25/16 105 lb (47.6 kg)  04/13/16 104 lb 6 oz (47.3 kg)  04/12/16 105 lb (47.6 kg)    Physical Exam  Constitutional: She appears well-developed. No distress.  HENT:  Head: Normocephalic.  Right Ear: External ear normal.  Left Ear: External ear normal.  Nose: Nose normal.  Mouth/Throat: Oropharynx is clear and moist.  Eyes: Conjunctivae are normal. Pupils are equal, round, and reactive to light. Right eye exhibits  no discharge. Left eye exhibits no discharge.  Neck: Normal [name] of motion. Neck supple. No JVD present. No tracheal deviation present. No thyromegaly present.  Cardiovascular: Normal rate, regular rhythm and normal heart sounds.   Pulmonary/Chest: No stridor. No respiratory distress. She has no wheezes.  Abdominal: Soft. Bowel sounds are normal. She exhibits no distension and no mass. There is no tenderness. There is no rebound and no guarding.  Musculoskeletal: She exhibits tenderness. She exhibits no edema.  Lymphadenopathy:    She has no cervical adenopathy.    Neurological: She displays normal reflexes. No cranial nerve deficit. She exhibits normal muscle tone. Coordination normal.  Skin: No rash noted. No erythema.  Psychiatric: She has a normal mood and affect. Her behavior is normal. Judgment and thought content normal.    a painful  5x5 mm spot on the R leg - post calf  Lab Results  Component Value Date   WBC 4.2 10/04/2015   HGB 13.2 10/04/2015   HCT 38.8 10/04/2015   PLT 179.0 10/04/2015   GLUCOSE 80 02/21/2016   CHOL 202 (H) 10/04/2015   TRIG 71.0 10/04/2015   HDL 71.20 10/04/2015   LDLCALC 116 (H) 10/04/2015   ALT 14 02/15/2016   AST 19 02/15/2016   NA 139 02/21/2016   K 5.0 02/21/2016   CL 105 02/21/2016   CREATININE 0.90 02/21/2016   BUN 22 02/21/2016   CO2 29 02/21/2016   TSH 1.84 10/04/2015    No results found.  Assessment & Plan:   There are no diagnoses linked to this encounter. I am having Ms. Espaillat maintain her Botulinum Toxin Type A (Cosm), ALREX, Hyaluronic Acid (RESTYLANE IJ), fluticasone, tretinoin, fluocinonide cream, EPIPEN 2-PAK, triamcinolone cream, Cyanocobalamin (VITAMIN B-12 PO), ICAPS AREDS 2, fexofenadine, conjugated estrogens, meclizine, pantoprazole, and ALPRAZolam.  No orders of the defined types were placed in this encounter.    Follow-up: No Follow-up on file.  [name], MD

## 2016-05-25 NOTE — Progress Notes (Signed)
Pre visit review using our clinic review tool, if applicable. No additional management support is needed unless otherwise documented below in the visit note. 

## 2016-05-28 NOTE — Assessment & Plan Note (Signed)
a painful  5x5 mm spot on the R leg - post calf: bug bite vs infection vs other Use Kenalog Rx

## 2016-05-30 ENCOUNTER — Ambulatory Visit: Payer: BLUE CROSS/BLUE SHIELD | Admitting: Internal Medicine

## 2016-05-31 ENCOUNTER — Encounter: Payer: Self-pay | Admitting: Internal Medicine

## 2016-05-31 ENCOUNTER — Ambulatory Visit (INDEPENDENT_AMBULATORY_CARE_PROVIDER_SITE_OTHER): Payer: BLUE CROSS/BLUE SHIELD | Admitting: Internal Medicine

## 2016-05-31 DIAGNOSIS — Z881 Allergy status to other antibiotic agents status: Secondary | ICD-10-CM

## 2016-05-31 DIAGNOSIS — L989 Disorder of the skin and subcutaneous tissue, unspecified: Secondary | ICD-10-CM

## 2016-05-31 NOTE — Progress Notes (Signed)
Subjective:  Patient ID: [name], female    DOB: March 11, 1951  Age: 65 y.o. MRN: [id number]  CC: No chief complaint on file.   HPI [name] presents for a spot on her leg better C/o h/o drug reactions  Outpatient Medications Prior to Visit  Medication Sig Dispense Refill  . ALPRAZolam (XANAX) 0.5 MG tablet May take 1 tablet 30 mins prior to MRI and 1 tablet before entering MRI if needed. 2 tablet 0  . ALREX 0.2 % SUSP Apply 0.2 drops to eye as needed (allergies).     . Botulinum Toxin Type A, Cosm, (BOTOX COSMETIC) 50 UNITS SOLR Inject 4-6 Units into the muscle. 3 month  injection    . conjugated estrogens (PREMARIN) vaginal cream INSERT 1 GRAM VAGINALLY ONCE A WEEK AS DIRECTED 30 g 3  . Cyanocobalamin (VITAMIN B-12 PO) Take 1 tablet by mouth daily.    [name] EPIPEN 2-PAK 0.3 MG/0.3ML SOAJ injection See admin instructions. Reported on 10/08/2015  1  . fexofenadine (ALLEGRA ALLERGY CHILDRENS) 30 MG tablet Take 30 mg by mouth 2 (two) times daily as needed (allergies).    . fluocinonide cream (LIDEX) AB-123456789 % Apply 1 application topically 2 (two) times daily. 90 g 2  . fluticasone (FLONASE) 50 MCG/ACT nasal spray Place 1 spray into both nostrils as needed for allergies.   5  . Hyaluronic Acid (RESTYLANE IJ) Inject as directed every 6 (six) months.    . meclizine (ANTIVERT) 25 MG tablet Take 1 tablet (25 mg total) by mouth 3 (three) times daily as needed for dizziness. 30 tablet 0  . Multiple Vitamins-Minerals (ICAPS AREDS 2) CAPS Take 1 capsule by mouth daily. Reported on 10/08/2015    . pantoprazole (PROTONIX) 40 MG tablet Take 1 tablet (40 mg total) by mouth daily. 30 tablet 11  . tretinoin (RETIN-A) 0.05 % cream Apply 1 application topically at bedtime.    . triamcinolone cream (KENALOG) 0.1 % Apply 1 application topically 3 (three) times daily. 30 g 0  . doxycycline (VIBRA-TABS) 100 MG tablet Take 1 tablet (100 mg total) by mouth 2 (two) times daily. 10 tablet 0   No  facility-administered medications prior to visit.     ROS Review of Systems  HENT: Negative for mouth sores.   Eyes: Negative for visual disturbance.  Gastrointestinal: Negative for abdominal pain and nausea.  Skin: Positive for color change. Negative for rash.  Psychiatric/Behavioral: Negative for confusion.    Objective:  BP 130/70   Pulse 72   Wt 106 lb (48.1 kg)   SpO2 98%   BMI 19.39 kg/m   BP Readings from Last 3 Encounters:  05/31/16 130/70  05/25/16 110/78  04/13/16 100/70    Wt Readings from Last 3 Encounters:  05/31/16 106 lb (48.1 kg)  05/25/16 105 lb (47.6 kg)  04/13/16 104 lb 6 oz (47.3 kg)    Physical Exam  Constitutional: No distress.  Skin: No rash noted. There is erythema. No pallor.  Psychiatric: She has a normal mood and affect. Her behavior is normal.  not painful  5x5 mm spot on the R leg, less red - post calf  Lab Results  Component Value Date   WBC 4.2 10/04/2015   HGB 13.2 10/04/2015   HCT 38.8 10/04/2015   PLT 179.0 10/04/2015   GLUCOSE 80 02/21/2016   CHOL 202 (H) 10/04/2015   TRIG 71.0 10/04/2015   HDL 71.20 10/04/2015   LDLCALC 116 (H) 10/04/2015   ALT 14 02/15/2016  AST 19 02/15/2016   NA 139 02/21/2016   K 5.0 02/21/2016   CL 105 02/21/2016   CREATININE 0.90 02/21/2016   BUN 22 02/21/2016   CO2 29 02/21/2016   TSH 1.84 10/04/2015    No results found.  Assessment & Plan:   There are no diagnoses linked to this encounter. I have discontinued Ms. Lorenzen's doxycycline. I am also having her maintain her Botulinum Toxin Type A (Cosm), ALREX, Hyaluronic Acid (RESTYLANE IJ), fluticasone, tretinoin, fluocinonide cream, EPIPEN 2-PAK, Cyanocobalamin (VITAMIN B-12 PO), ICAPS AREDS 2, fexofenadine, conjugated estrogens, meclizine, pantoprazole, ALPRAZolam, and triamcinolone cream.  No orders of the defined types were placed in this encounter.    Follow-up: No Follow-up on file.  [name], MD

## 2016-05-31 NOTE — Progress Notes (Signed)
Pre visit review using our clinic review tool, if applicable. No additional management support is needed unless otherwise documented below in the visit note. 

## 2016-05-31 NOTE — Assessment & Plan Note (Signed)
Multiple abx allergy F/u w/Dr Doristine Johns We could use Cephalosporins, Quinolones, Nitrofurantoin

## 2016-05-31 NOTE — Assessment & Plan Note (Signed)
Slow improvement 

## 2016-07-27 ENCOUNTER — Ambulatory Visit: Payer: BLUE CROSS/BLUE SHIELD | Admitting: Internal Medicine

## 2016-07-31 ENCOUNTER — Ambulatory Visit: Payer: Self-pay | Admitting: Internal Medicine

## 2016-08-07 ENCOUNTER — Ambulatory Visit: Payer: Self-pay | Admitting: Family Medicine

## 2016-08-09 ENCOUNTER — Ambulatory Visit (INDEPENDENT_AMBULATORY_CARE_PROVIDER_SITE_OTHER): Payer: BLUE CROSS/BLUE SHIELD | Admitting: Family Medicine

## 2016-08-09 ENCOUNTER — Encounter: Payer: Self-pay | Admitting: Family Medicine

## 2016-08-09 ENCOUNTER — Ambulatory Visit: Payer: Self-pay

## 2016-08-09 VITALS — BP 116/72 | HR 73 | Ht 62.0 in | Wt 107.0 lb

## 2016-08-09 DIAGNOSIS — M222X1 Patellofemoral disorders, right knee: Secondary | ICD-10-CM | POA: Diagnosis not present

## 2016-08-09 DIAGNOSIS — M222X2 Patellofemoral disorders, left knee: Secondary | ICD-10-CM | POA: Diagnosis not present

## 2016-08-09 DIAGNOSIS — M25561 Pain in right knee: Secondary | ICD-10-CM | POA: Diagnosis not present

## 2016-08-09 DIAGNOSIS — M25562 Pain in left knee: Secondary | ICD-10-CM | POA: Diagnosis not present

## 2016-08-09 NOTE — Assessment & Plan Note (Addendum)
New problem. Patient doing the home exercises and icing protocol. Patient given topical anti-inflammatories. Work with Product/process development scientist to learn home exercises somewhat but then also sent to formal physical therapy. Will come back and see me again in 4-6 weeks.

## 2016-08-09 NOTE — Patient Instructions (Signed)
Good to see you  Ice 20 minutes 2 times daily. Usually after activity and before bed. pennsaid pinkie amount topically 2 times daily as needed.  Stay active but try more biking for now for cardio.  They will call you on PT  See me again in 6 weeks.

## 2016-08-09 NOTE — Progress Notes (Signed)
[name] [place], [place] [address] Phone: [phone] Subjective:     CC: Knee pain.   [id number]  [name] is a 66 y.o. female coming in with complaint of bilateral knee pain. We discussed the pain as more of a dull, throbbing aching sensation. Patient is very active. Patient states that most the pain seems be on the anterior aspect of the knee but very seldomly happens. Patient states though that she has had a tightness most every night now. States that it seems to be just above the knee. States that by the morning it seems to get somewhat better. Sometimes when she squats down she has a sharp pain anteriorly but only last seconds. No residual affect. No radiation of pain. No numbness. Denies any weakness. No new activities or any new injury. Rates the severity of pain as 5 out of 10 but does not want to get worse.     Past Medical History:  Diagnosis Date  . Allergic rhinitis, cause unspecified   . Backache, unspecified   . Benign fasciculation-cramp syndrome ([place]) 12/03/2012    Worsening with  stress, fatigue .  [name] Calculus of kidney   . Conversion disorder   . Cotton wool spots   . Esophageal reflux   . GERD (gastroesophageal reflux disease)   . Hemorrhage of rectum and anus   . Internal hemorrhoids without mention of complication   . Irritable bowel syndrome   . Microscopic hematuria   . Migraine headache with aura   . Mild aortic insufficiency   . Mild tricuspid regurgitation   . Osteopenia   . Other plastic surgery for unacceptable cosmetic appearance    Inj tx fllers/expander  . Personal history of urinary calculi   . Postmenopausal atrophic vaginitis   . Premature atrial contractions   . PVC (premature ventricular contraction)    a. Event monitor 2013.  [name] Unspecified constipation   . Vasovagal syncope    Past Surgical History:  Procedure Laterality Date  . BREAST BIOPSY Right   . Cosmetic Procedures  w/Injection therapy    . SKIN CANCER EXCISION Left    thigh  . WISDOM TOOTH EXTRACTION     Social History   Social History  . Marital status: Married    Spouse name: [name]  . Number of children: 0  . Years of education: 14   Occupational History  . 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 [employer]   . [employer]   Social History Main Topics  . Smoking status: Never Smoker  . Smokeless tobacco: Never Used  . Alcohol use No  . Drug use: No  . Sexual activity: Not on file   Other Topics Concern  . Not on file   Social History Narrative   [employer]  in [place]. Married '90 - marriage is in very good shape '12, No children.  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 [facility] medical services for them if needed (Nov '11)   Patient is married [name]) and lives at home with her husband.   Patient is working Administrator, arts work.   Patient has a high school education.   Patient is right-handed.   Patient does not drink any caffeine.         Allergies  Allergen Reactions  . Codeine Other (See Comments)    Doesn't remeber  .  Doxycycline Other (See Comments)  . Epinephrine     Heart racing   . Neomycin     Topical rash from cream  . Penicillins Hives and Other (See Comments)    Has patient had a PCN reaction causing immediate rash, facial/tongue/throat swelling, SOB or lightheadedness with hypotension: Yes Has patient had a PCN reaction causing severe rash involving mucus membranes or skin necrosis: Yes Has patient had a PCN reaction that required hospitalization No Has patient had a PCN reaction occurring within the last 10 years: No If all of the above answers are "NO", then may proceed with Cephalosporin use.   . Sulfonamide Derivatives Other (See Comments)    Doesn't remember   . Zithromax [Azithromycin] Rash   Family History  Problem Relation  Age of Onset  . Hyperlipidemia Father   . COPD Father     Smoker, deceased 07/09/14  . Hyperlipidemia Mother   . Diabetes Neg Hx   . Colon cancer Neg Hx   . Breast cancer Neg Hx   . Coronary artery disease Neg Hx   . Cancer Neg Hx     breast or colon  . Hypertension Father   . Hypertension Mother     Past medical history, social, surgical and family history all reviewed in electronic medical record.  No pertanent information unless stated regarding to the chief complaint.   Review of Systems:Review of systems updated and as accurate as of 08/09/16  No headache, visual changes, nausea, vomiting, diarrhea, constipation, dizziness, abdominal pain, skin rash, fevers, chills, night sweats, weight loss, swollen lymph nodes, body aches, joint swelling, muscle aches, chest pain, shortness of breath, mood changes.   Objective  Blood pressure 116/72, pulse 73, height 5\' 2"  (1.575 m), weight 107 lb (48.5 kg), SpO2 99 %. Systems examined below as of 08/09/16   General: No apparent distress alert and oriented x3 mood and affect normal, dressed appropriately.  HEENT: Pupils equal, extraocular movements intact  Respiratory: Patient's speak in full sentences and does not appear short of breath  Cardiovascular: No lower extremity edema, non tender, no erythema  Skin: Warm dry intact with no signs of infection or rash on extremities or on axial skeleton.  Abdomen: Soft nontender  Neuro: Cranial nerves II through XII are intact, neurovascularly intact in all extremities with 2+ DTRs and 2+ pulses.  Lymph: No lymphadenopathy of posterior or anterior cervical chain or axillae bilaterally.  Gait normal with good balance and coordination.  MSK:  Non tender with full range of motion and good stability and symmetric strength and tone of shoulders, elbows, wrist, hip, and ankles bilaterally.  Knee: Bilateral Mild lateral chills of the patella bilaterally Minimal discomfort over the superior lateral  patella ROM full in flexion and extension and lower leg rotation. Ligaments with solid consistent endpoints including ACL, PCL, LCL, MCL. Negative Mcmurray's, Apley's, and Thessalonian tests. Mild painful patellar compression. Patellar glide with minimal crepitus. Patellar and quadriceps tendons unremarkable. Hamstring and quadriceps strength is normal.    MSK US performed of: Bilateral This study was ordered, performed, and interpreted by Charlann Boxer D.O.  Knee: Limited exam shows the patient does have some mild narrowing of the patellofemoral joint bilaterally especially laterally. Patient still though does have good articular cartilage. Trace effusion of the suprapatellar pouch. Otherwise unremarkable.  IMPRESSION: Findings consistent with patellofemoral syndrome.    Impression and Recommendations:     This case required medical decision making of moderate complexity.      Note: This  dictation was prepared with Dragon dictation along with smaller phrase technology. Any transcriptional errors that result from this process are unintentional.

## 2016-08-10 ENCOUNTER — Telehealth: Payer: Self-pay | Admitting: Family Medicine

## 2016-08-10 NOTE — Telephone Encounter (Signed)
Patient would like to know if it is ok to continue to do walking lunges?  Would like to know what is the diagnosis on her knee?

## 2016-08-13 NOTE — Telephone Encounter (Signed)
I would say no for now.  Keep feet planted with any lower body activity for now.

## 2016-08-13 NOTE — Telephone Encounter (Signed)
Spoke with patient regarding patellofemoral and Dr. Thompson Caul recommendations to perform closed verses open chain lower extremity exercise.

## 2016-08-14 ENCOUNTER — Ambulatory Visit (INDEPENDENT_AMBULATORY_CARE_PROVIDER_SITE_OTHER)
Admission: RE | Admit: 2016-08-14 | Discharge: 2016-08-14 | Disposition: A | Discharge status: Home / Self Care | Payer: BLUE CROSS/BLUE SHIELD | Source: Ambulatory Visit | Attending: Family Medicine | Admitting: Family Medicine

## 2016-08-14 ENCOUNTER — Encounter: Payer: Self-pay | Admitting: Family Medicine

## 2016-08-14 ENCOUNTER — Ambulatory Visit (INDEPENDENT_AMBULATORY_CARE_PROVIDER_SITE_OTHER): Payer: BLUE CROSS/BLUE SHIELD | Admitting: Family Medicine

## 2016-08-14 VITALS — BP 128/72 | HR 75 | Ht 62.0 in | Wt 107.0 lb

## 2016-08-14 DIAGNOSIS — M79605 Pain in left leg: Secondary | ICD-10-CM

## 2016-08-14 DIAGNOSIS — S86892A Other injury of other muscle(s) and tendon(s) at lower leg level, left leg, initial encounter: Secondary | ICD-10-CM | POA: Diagnosis not present

## 2016-08-14 MED ORDER — VITAMIN D (ERGOCALCIFEROL) 1.25 MG (50000 UNIT) PO CAPS: 50000.0000 [IU] | ORAL_CAPSULE | ORAL | 0 refills | Status: DC

## 2016-08-14 NOTE — Patient Instructions (Signed)
Good to see you  I have no idea how you did this Try lower impact for now.  Ice is your friend.  Once weekly vitamin D for next 12 weeks Compression sleeve for the calf with activity  See me again in 3 weeks make sure you are doing well.

## 2016-08-14 NOTE — Progress Notes (Signed)
[name] [address] [place], [place] [address] Phone: [phone] Subjective:     CC: Left leg pain  [id number]  Josepha Tousey is a 66 y.o. female coming in with complaint of bilateral knee pain. Seen last week and was diagnosed with patellofemoral syndrome. Patient states that she has been having more of a left leg pain. Seems to be more in the shin area. Worse with activity and a dull throbbing aching pain at night. Denies any fevers or chills. Patient has been very active recently. Patient states that it seems to be more swollen than the contralateral side. Denies any shortness of breath. Denies any true calf pain. All seems to be on the anterior bone.     Past Medical History:  Diagnosis Date  . Allergic rhinitis, cause unspecified   . Backache, unspecified   . Benign fasciculation-cramp syndrome ([place]) 12/03/2012    Worsening with  stress, fatigue .  [name] Calculus of kidney   . Conversion disorder   . Cotton wool spots   . Esophageal reflux   . GERD (gastroesophageal reflux disease)   . Hemorrhage of rectum and anus   . Internal hemorrhoids without mention of complication   . Irritable bowel syndrome   . Microscopic hematuria   . Migraine headache with aura   . Mild aortic insufficiency   . Mild tricuspid regurgitation   . Osteopenia   . Other plastic surgery for unacceptable cosmetic appearance    Inj tx fllers/expander  . Personal history of urinary calculi   . Postmenopausal atrophic vaginitis   . Premature atrial contractions   . PVC (premature ventricular contraction)    a. Event monitor 2013.  [name] Unspecified constipation   . Vasovagal syncope    Past Surgical History:  Procedure Laterality Date  . BREAST BIOPSY Right   . Cosmetic Procedures w/Injection therapy    . SKIN CANCER EXCISION Left    thigh  . WISDOM TOOTH EXTRACTION     Social History   Social History  . Marital status: Married    Spouse name: [name]  .  Number of children: 0  . Years of education: 14   Occupational History  . 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 [employer]   . [employer]   Social History Main Topics  . Smoking status: Never Smoker  . Smokeless tobacco: Never Used  . Alcohol use No  . Drug use: No  . Sexual activity: Not Asked   Other Topics Concern  . 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 [facility] medical services for them if needed (Nov '11)   Patient is married [name]) and lives at home with her husband.   Patient is working Administrator, arts work.   Patient has a high school education.   Patient is right-handed.   Patient does not drink any caffeine.         Allergies  Allergen Reactions  . Codeine Other (See Comments)    Doesn't remeber  . Doxycycline Other (See Comments)  . Epinephrine     Heart racing   . Neomycin     Topical rash from cream  . Penicillins Hives and Other (See Comments)    Has patient  had a PCN reaction causing immediate rash, facial/tongue/throat swelling, SOB or lightheadedness with hypotension: Yes Has patient had a PCN reaction causing severe rash involving mucus membranes or skin necrosis: Yes Has patient had a PCN reaction that required hospitalization No Has patient had a PCN reaction occurring within the last 10 years: No If all of the above answers are "NO", then may proceed with Cephalosporin use.   . Sulfonamide Derivatives Other (See Comments)    Doesn't remember   . Zithromax [Azithromycin] Rash   Family History  Problem Relation Age of Onset  . Hyperlipidemia Father   . COPD Father     Smoker, deceased 06-19-2014  . Hyperlipidemia Mother   . Diabetes Neg Hx   . Colon cancer Neg Hx   . Breast cancer Neg Hx   .  Coronary artery disease Neg Hx   . Cancer Neg Hx     breast or colon  . Hypertension Father   . Hypertension Mother     Past medical history, social, surgical and family history all reviewed in electronic medical record.  No pertanent information unless stated regarding to the chief complaint.   Review of Systems: No headache, visual changes, nausea, vomiting, diarrhea, constipation, dizziness, abdominal pain, skin rash, fevers, chills, night sweats, weight loss, swollen lymph nodes, body aches, joint swelling, muscle aches, chest pain, shortness of breath, mood changes.    Objective  Blood pressure 128/72, pulse 75, height 5\' 2"  (1.575 m), weight 107 lb (48.5 kg), SpO2 99 %.   Systems examined below as of 08/14/16 General: NAD A&O x3 mood, affect normal  HEENT: Pupils equal, extraocular movements intact no nystagmus Respiratory: not short of breath at rest or with speaking Cardiovascular: No lower extremity edema, non tender Skin: Warm dry intact with no signs of infection or rash on extremities or on axial skeleton. Abdomen: Soft nontender, no masses Neuro: Cranial nerves  intact, neurovascularly intact in all extremities with 2+ DTRs and 2+ pulses. Lymph: No lymphadenopathy appreciated today  Gait normal with good balance and coordination.  MSK: Non tender with full range of motion and good stability and symmetric strength and tone of shoulders, elbows, wrist,  knee hips and ankles bilaterally.    Lower leg examination of the patient does have hypertrophia of the bone as well as a anterior tibialis on the medial aspect of the tibia. Patient is tender to palpation in the middle third of the tibia just medial to the tibia ridge. No pain to percussion   MSK US performed of: Left leg This study was ordered, performed, and interpreted by Charlann Boxer D.O.  Left tibial region does show hypoechoic changes right above the bone itself as well as a callus formation. This is the area where  patient is most tender. Increasing Doppler flow noted.  IMPRESSION: Medial tibial stress syndrome    Impression and Recommendations:     This case required medical decision making of moderate complexity.      Note: This dictation was prepared with Dragon dictation along with smaller phrase technology. Any transcriptional errors that result from this process are unintentional.

## 2016-08-14 NOTE — Assessment & Plan Note (Signed)
Real tibial stress syndrome noted on the left sign. I do not think that there is any bony abnormality but x-rays were ordered otherwise. We discussed icing regimen. Discussed home exercises. Discussed compression. Put on once weekly vitamin D. Discussed avoiding high impact exercises. Follow-up again in 4 weeks

## 2016-08-15 ENCOUNTER — Ambulatory Visit: Payer: Self-pay | Admitting: Family Medicine

## 2016-08-15 ENCOUNTER — Ambulatory Visit (INDEPENDENT_AMBULATORY_CARE_PROVIDER_SITE_OTHER): Payer: BLUE CROSS/BLUE SHIELD | Admitting: Family Medicine

## 2016-08-15 ENCOUNTER — Encounter: Payer: Self-pay | Admitting: Family Medicine

## 2016-08-15 VITALS — BP 104/70 | HR 78 | Temp 98.5°F | Resp 12 | Ht 62.0 in | Wt 105.1 lb

## 2016-08-15 DIAGNOSIS — R197 Diarrhea, unspecified: Secondary | ICD-10-CM | POA: Diagnosis not present

## 2016-08-15 DIAGNOSIS — J029 Acute pharyngitis, unspecified: Secondary | ICD-10-CM | POA: Diagnosis not present

## 2016-08-15 DIAGNOSIS — R110 Nausea: Secondary | ICD-10-CM | POA: Diagnosis not present

## 2016-08-15 LAB — POCT RAPID STREP A (OFFICE): Rapid Strep A Screen: NEGATIVE

## 2016-08-15 LAB — POC INFLUENZA A&B (BINAX/QUICKVUE)
Influenza A, POC: NEGATIVE
Influenza B, POC: NEGATIVE

## 2016-08-15 MED ORDER — ONDANSETRON HCL 4 MG PO TABS: 4.0000 mg | ORAL_TABLET | Freq: Three times a day (TID) | ORAL | 0 refills | Status: DC | PRN

## 2016-08-15 MED ORDER — MAGIC MOUTHWASH W/LIDOCAINE: 5.0000 mL | Freq: Three times a day (TID) | ORAL | 0 refills | Status: AC | PRN

## 2016-08-15 NOTE — Progress Notes (Signed)
Pre visit review using our clinic review tool, if applicable. No additional management support is needed unless otherwise documented below in the visit note. 

## 2016-08-15 NOTE — Progress Notes (Signed)
HPI:  ACUTE VISIT  Chief Complaint  Patient presents with  . Sore Throat    [name] is a 66 y.o.female here today complaining of sore throat that started yesterday afternoon. She states that sore throat is worse today, "bad", no stridor or dysphagia.  She has not had cough, nasal congestion, rhinorrhea, or post nasal drainage. No fever,chills, or body aches. + Night sweats last night. Mild nausea this morning and one episode of diarrhea,no blood or mucus.  Denies abdominal pain or vomiting.  She has not noted chest pain, dyspnea, or wheezing.  No Hx of recent travel. Sick contact: She heard about 2 co-workers having "the flu" but she has not been around them. No known insect bite.  Hx of allergies: Yes, she receives allergy shots at the immunologists office.  OTC medications for this problem: Ibuprofen 200 mg 2 tabs last night.   Review of Systems  Constitutional: Positive for fatigue. Negative for activity change, appetite change and fever.  HENT: Positive for sore throat. Negative for ear pain, mouth sores, postnasal drip, sinus pressure, trouble swallowing and voice change.   Eyes: Negative for discharge and redness.  Respiratory: Negative for cough, shortness of breath and wheezing.   Gastrointestinal: Positive for diarrhea and nausea. Negative for abdominal pain and vomiting.  Musculoskeletal: Negative for myalgias and neck pain.  Skin: Negative for rash.  Allergic/Immunologic: Positive for environmental allergies.  Neurological: Negative for weakness and headaches.  Hematological: Negative for adenopathy. Does not bruise/bleed easily.  Psychiatric/Behavioral: Negative for confusion. The patient is nervous/anxious.       Current Outpatient Prescriptions on File Prior to Visit  Medication Sig Dispense Refill  . ALPRAZolam (XANAX) 0.5 MG tablet May take 1 tablet 30 mins prior to MRI and 1 tablet before entering MRI if needed. 2 tablet 0  .  ALREX 0.2 % SUSP Apply 0.2 drops to eye as needed (allergies).     . Botulinum Toxin Type A, Cosm, (BOTOX COSMETIC) 50 UNITS SOLR Inject 4-6 Units into the muscle. 3 month  injection    . conjugated estrogens (PREMARIN) vaginal cream INSERT 1 GRAM VAGINALLY ONCE A WEEK AS DIRECTED 30 g 3  . Cyanocobalamin (VITAMIN B-12 PO) Take 1 tablet by mouth daily.    [name] EPIPEN 2-PAK 0.3 MG/0.3ML SOAJ injection See admin instructions. Reported on 10/08/2015  1  . fexofenadine (ALLEGRA ALLERGY CHILDRENS) 30 MG tablet Take 30 mg by mouth 2 (two) times daily as needed (allergies).    . fluocinonide cream (LIDEX) [id number] % Apply 1 application topically 2 (two) times daily. 90 g 2  . fluticasone (FLONASE) 50 MCG/ACT nasal spray Place 1 spray into both nostrils as needed for allergies.   5  . Hyaluronic Acid (RESTYLANE IJ) Inject as directed every 6 (six) months.    . meclizine (ANTIVERT) 25 MG tablet Take 1 tablet (25 mg total) by mouth 3 (three) times daily as needed for dizziness. 30 tablet 0  . Multiple Vitamins-Minerals (ICAPS AREDS 2) CAPS Take 1 capsule by mouth daily. Reported on 10/08/2015    . pantoprazole (PROTONIX) 40 MG tablet Take 1 tablet (40 mg total) by mouth daily. 30 tablet 11  . tretinoin (RETIN-A) 0.05 % cream Apply 1 application topically at bedtime.    . triamcinolone cream (KENALOG) 0.1 % Apply 1 application topically 3 (three) times daily. 30 g 0  . Vitamin D, Ergocalciferol, (DRISDOL) 50000 units CAPS capsule Take 1 capsule (50,000 Units total) by mouth every  7 (seven) days. 12 capsule 0   No current facility-administered medications on file prior to visit.      Past Medical History:  Diagnosis Date  . Allergic rhinitis, cause unspecified   . Backache, unspecified   . Benign fasciculation-cramp syndrome ([place]) 12/03/2012    Worsening with  stress, fatigue .  [name] Calculus of kidney   . Conversion disorder   . Cotton wool spots   . Esophageal reflux   . GERD (gastroesophageal reflux disease)    . Hemorrhage of rectum and anus   . Internal hemorrhoids without mention of complication   . Irritable bowel syndrome   . Microscopic hematuria   . Migraine headache with aura   . Mild aortic insufficiency   . Mild tricuspid regurgitation   . Osteopenia   . Other plastic surgery for unacceptable cosmetic appearance    Inj tx fllers/expander  . Personal history of urinary calculi   . Postmenopausal atrophic vaginitis   . Premature atrial contractions   . PVC (premature ventricular contraction)    a. Event monitor 2013.  [name] Unspecified constipation   . Vasovagal syncope    Allergies  Allergen Reactions  . Codeine Other (See Comments)    Doesn't remeber  . Doxycycline Other (See Comments)  . Epinephrine     Heart racing   . Neomycin     Topical rash from cream  . Penicillins Hives and Other (See Comments)    Has patient had a PCN reaction causing immediate rash, facial/tongue/throat swelling, SOB or lightheadedness with hypotension: Yes Has patient had a PCN reaction causing severe rash involving mucus membranes or skin necrosis: Yes Has patient had a PCN reaction that required hospitalization No Has patient had a PCN reaction occurring within the last 10 years: No If all of the above answers are "NO", then may proceed with Cephalosporin use.   . Sulfonamide Derivatives Other (See Comments)    Doesn't remember   . Zithromax [Azithromycin] Rash    Social History   Social History  . Marital status: Married    Spouse name: [name]  . Number of children: 0  . Years of education: 14   Occupational History  . 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 [employer]   . [employer]   Social History Main Topics  . Smoking status: Never Smoker  . Smokeless tobacco: Never Used  . Alcohol use No  . Drug use: No  . Sexual activity: Not Asked   Other Topics Concern  . 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 [facility] medical services for them if needed (Nov '11)   Patient is married [name]) and lives at home with her husband.   Patient is working Administrator, arts work.   Patient has a high school education.   Patient is right-handed.   Patient does not drink any caffeine.          Vitals:   08/15/16 0816  BP: 104/70  Pulse: 78  Resp: 12  Temp: 98.5 F (36.9 C)  O2 98% at RA.  Body mass index is 19.23 kg/m.   Physical Exam  Nursing note and vitals reviewed. Constitutional: She is oriented to person, place, and time. She appears well-developed and well-nourished.  She does not appear ill. No distress.  HENT:  Head: Atraumatic.  Nose: Rhinorrhea present. Right sinus exhibits no maxillary sinus tenderness and no frontal sinus tenderness. Left sinus exhibits no maxillary sinus tenderness and no frontal sinus tenderness.  Mouth/Throat: Uvula is midline and mucous membranes are normal. Posterior oropharyngeal erythema (mild) present. No oropharyngeal exudate or posterior oropharyngeal edema.  Eyes: Conjunctivae and EOM are normal.  Neck: No muscular tenderness present. No tracheal deviation, no edema and no erythema present.  Cardiovascular: Normal rate and regular rhythm.   No murmur heard. Respiratory: Effort normal and breath sounds normal. No stridor. No respiratory distress.  GI: Soft. Bowel sounds are normal. She exhibits no mass. There is no hepatomegaly. There is no tenderness.  Musculoskeletal: She exhibits no edema.  Lymphadenopathy:       Head (right side): No submandibular adenopathy present.       Head (left side): No submandibular adenopathy present.    She has no cervical adenopathy.  Neurological: She is alert and oriented to person, place, and time. She has normal strength. Gait normal.  Skin:  Skin is warm. No rash noted. No erythema.  Psychiatric: Her speech is normal. Her mood appears anxious.  Well groomed, good eye contact.      ASSESSMENT AND PLAN:   [name] was seen today for sore throat.  Diagnoses and all orders for this visit:  Acute pharyngitis, unspecified etiology  Symptoms suggests a viral etiology, other possible causes discussed. Rapid strep and flu negative.  I explained patient that symptomatic treatment recommended in this case, so I do not think abx is needed at this time. Instructed to monitor for new symptoms as well as for signs of complications, instructed about warning signs. I also explained that if cough and nasal congestion develop they can last a few days and sometimes weeks. F/U as needed.    -     POC Rapid Strep A -     POC Influenza A&B (Binax test) -     Culture, Group A Strep -     magic mouthwash w/lidocaine SOLN; Take 5 mLs by mouth 3 (three) times daily as needed for mouth pain.  Acute diarrhea  Also most likely viral.Hx of IBS but mainly constipation, denies Hx of diarrhea. Adequate hydration. Good hand hygiene. F/U as needed.  Nausea without vomiting  Zofran if needed. Small and light meals and sips of clear fluids as tolerated. Instructed about warning signs.  -     ondansetron (ZOFRAN) 4 MG tablet; Take 1 tablet (4 mg total) by mouth every 8 (eight) hours as needed for nausea or vomiting.    -Ms. [name] was advised to return or notify a doctor immediately if symptoms worsen or persist or new concerns arise.       Betty G. Martinique, MD  [facility]. [place] office.

## 2016-08-15 NOTE — Patient Instructions (Addendum)
  [name] I have seen you today for an acute visit.  A few things to remember from today's visit:   Acute pharyngitis, unspecified etiology - Plan: POC Rapid Strep A, POC Influenza A&B (Binax test), Culture, Group A Strep, magic mouthwash w/lidocaine SOLN  Acute diarrhea  Nausea without vomiting - Plan: ondansetron (ZOFRAN) 4 MG tablet   Monitor for new symptoms. If fever develops it can last 4-7 days.   Symptomatic treatment: Over the counter Acetaminophen 500 mg and/or Ibuprofen (400-600 mg) if there is not contraindications; you can alternate in between both every 4-6 hours. Gargles with saline water and throat lozenges might also help. Cold fluids.    Seek prompt medical evaluation if you are having difficulty breathing, mouth swelling, throat closing up, not able to swallow liquids (drooling), skin rash/bruising, or worsening symptoms.  Please follow up in 2 weeks if not any better.         Medications prescribed today are intended for short period of time and will not be refill upon request, a follow up appointment might be necessary to discuss continuation of of treatment if appropriate.     In general please monitor for signs of worsening symptoms and seek immediate medical attention if any concerning.  If symptoms are not resolved in 10-14 days you should schedule a follow up appointment with your doctor, before if needed.  Please be sure you have an appointment already scheduled with your PCP before you leave today.

## 2016-08-16 ENCOUNTER — Ambulatory Visit: Payer: BLUE CROSS/BLUE SHIELD | Admitting: Physical Therapy

## 2016-08-16 ENCOUNTER — Telehealth: Payer: Self-pay | Admitting: Internal Medicine

## 2016-08-16 NOTE — Telephone Encounter (Signed)
Pt was calling to check on the strep results and to see if it is okay to return to work she does not have a fever.

## 2016-08-16 NOTE — Telephone Encounter (Signed)
Throat Cx may take a few days for results to come back, we will let her know as soon as we get results. If not fever for 24 hours it is Ok to go back to work.  Thanks, BJ

## 2016-08-16 NOTE — Telephone Encounter (Signed)
Called and spoke with patient. Went over below information. Patient has not had a fever, so advised that she can return to work tomorrow. Advised that the throat cx has not come back yet, and does take a couple of days to get back to [place]. Patient verbalized understanding.

## 2016-08-17 ENCOUNTER — Ambulatory Visit: Payer: Self-pay | Admitting: Internal Medicine

## 2016-08-17 ENCOUNTER — Ambulatory Visit: Payer: Self-pay | Admitting: Family Medicine

## 2016-08-20 ENCOUNTER — Other Ambulatory Visit: Payer: Self-pay

## 2016-08-20 ENCOUNTER — Ambulatory Visit (HOSPITAL_COMMUNITY): Payer: BLUE CROSS/BLUE SHIELD | Attending: Cardiovascular Disease

## 2016-08-20 DIAGNOSIS — I493 Ventricular premature depolarization: Secondary | ICD-10-CM | POA: Insufficient documentation

## 2016-08-20 DIAGNOSIS — I059 Rheumatic mitral valve disease, unspecified: Secondary | ICD-10-CM | POA: Diagnosis present

## 2016-08-20 DIAGNOSIS — I351 Nonrheumatic aortic (valve) insufficiency: Secondary | ICD-10-CM | POA: Diagnosis not present

## 2016-08-24 ENCOUNTER — Encounter: Payer: Self-pay | Admitting: Cardiovascular Disease

## 2016-08-26 NOTE — Progress Notes (Signed)
Chief Complaint  Patient presents with  . Headache     History of Present Illness: 66 yo female with history of premature atrial contractions, palpitations, vasovagal syncope, mild aortic valve insufficiency, mild mitral regurgitation, IBS, GERD here today for cardiac follow up. She has been followed in the past by Dr. Verl Blalock. Echo 09/07/13 with with mild AI, mild MR, normal LV function. Exercise stress test 05/07/14 with good exercise tolerance, no ischemic EKG changes and no chest pain with exercise. Most recent echo March 2018 with normal LV function, moderate AI.   She is here for follow up. She tells me today that she has been doing well. She is very active. She works out every day. No chest pain or SOB. Occasional skipped beats and palpitations. Last for a few seconds. This occurs once every few months.   Primary Care Physician: [name], MD  Past Medical History:  Diagnosis Date  . Allergic rhinitis, cause unspecified   . Backache, unspecified   . Benign fasciculation-cramp syndrome ([place]) 12/03/2012    Worsening with  stress, fatigue .  [name] Calculus of kidney   . Conversion disorder   . Cotton wool spots   . Esophageal reflux   . GERD (gastroesophageal reflux disease)   . Hemorrhage of rectum and anus   . Internal hemorrhoids without mention of complication   . Irritable bowel syndrome   . Microscopic hematuria   . Migraine headache with aura   . Mild aortic insufficiency   . Mild tricuspid regurgitation   . Osteopenia   . Other plastic surgery for unacceptable cosmetic appearance    Inj tx fllers/expander  . Personal history of urinary calculi   . Postmenopausal atrophic vaginitis   . Premature atrial contractions   . PVC (premature ventricular contraction)    a. Event monitor 2013.  [name] Unspecified constipation   . Vasovagal syncope     Past Surgical History:  Procedure Laterality Date  . BREAST BIOPSY Right   . Cosmetic Procedures w/Injection therapy    .  SKIN CANCER EXCISION Left    thigh  . WISDOM TOOTH EXTRACTION      Current Outpatient Prescriptions  Medication Sig Dispense Refill  . ALPRAZolam (XANAX) 0.5 MG tablet May take 1 tablet 30 mins prior to MRI and 1 tablet before entering MRI if needed. 2 tablet 0  . ALREX 0.2 % SUSP Apply 0.2 drops to eye as needed (allergies).     . Botulinum Toxin Type A, Cosm, (BOTOX COSMETIC) 50 UNITS SOLR Inject 4-6 Units into the muscle. 3 month  injection    . conjugated estrogens (PREMARIN) vaginal cream INSERT 1 GRAM VAGINALLY ONCE A WEEK AS DIRECTED 30 g 3  . Cyanocobalamin (VITAMIN B-12 PO) Take 1 tablet by mouth daily.    [name] EPIPEN 2-PAK 0.3 MG/0.3ML SOAJ injection See admin instructions. Reported on 10/08/2015  1  . fexofenadine (ALLEGRA ALLERGY CHILDRENS) 30 MG tablet Take 30 mg by mouth 2 (two) times daily as needed (allergies).    . fluocinonide cream (LIDEX) 5.09 % Apply 1 application topically 2 (two) times daily. 90 g 2  . fluticasone (FLONASE) 50 MCG/ACT nasal spray Place 1 spray into both nostrils as needed for allergies.   5  . Hyaluronic Acid (RESTYLANE IJ) Inject as directed every 6 (six) months.    . meclizine (ANTIVERT) 25 MG tablet Take 1 tablet (25 mg total) by mouth 3 (three) times daily as needed for dizziness. 30 tablet 0  .  Multiple Vitamins-Minerals (ICAPS AREDS 2) CAPS Take 1 capsule by mouth daily. Reported on 10/08/2015    . ondansetron (ZOFRAN) 4 MG tablet Take 1 tablet (4 mg total) by mouth every 8 (eight) hours as needed for nausea or vomiting. 15 tablet 0  . pantoprazole (PROTONIX) 40 MG tablet Take 1 tablet (40 mg total) by mouth daily. 30 tablet 11  . tretinoin (RETIN-A) 0.05 % cream Apply 1 application topically at bedtime.    . triamcinolone cream (KENALOG) 0.1 % Apply 1 application topically 3 (three) times daily. 30 g 0  . Vitamin D, Ergocalciferol, (DRISDOL) 50000 units CAPS capsule Take 1 capsule (50,000 Units total) by mouth every 7 (seven) days. 12 capsule 0   No  current facility-administered medications for this visit.     Allergies  Allergen Reactions  . Codeine Other (See Comments)    Doesn't remeber  . Doxycycline Other (See Comments)  . Epinephrine     Heart racing   . Neomycin     Topical rash from cream  . Penicillins Hives and Other (See Comments)    Has patient had a PCN reaction causing immediate rash, facial/tongue/throat swelling, SOB or lightheadedness with hypotension: Yes Has patient had a PCN reaction causing severe rash involving mucus membranes or skin necrosis: Yes Has patient had a PCN reaction that required hospitalization No Has patient had a PCN reaction occurring within the last 10 years: No If all of the above answers are "NO", then may proceed with Cephalosporin use.   . Sulfonamide Derivatives Other (See Comments)    Doesn't remember   . Zithromax [Azithromycin] Rash    Social History   Social History  . Marital status: Married    Spouse name: [name]  . Number of children: 0  . Years of education: 14   Occupational History  . 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 [employer]   . [employer]   Social History Main Topics  . Smoking status: Never Smoker  . Smokeless tobacco: Never Used  . Alcohol use No  . Drug use: No  . Sexual activity: Not on file   Other Topics Concern  . Not on file   Social History Narrative   [employer]  in [place]. Married '90 - marriage is in very good shape '12, No children.  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 Offerred medical services for them if needed (Nov '11)   Patient is married [name]) and lives at home with her husband.   Patient is working Administrator, arts work.   Patient has a high school education.   Patient is right-handed.   Patient does not drink any caffeine.          Family History  Problem Relation Age of Onset  .  Hyperlipidemia Father   . COPD Father     Smoker, deceased 30-Jun-2014  . Hypertension Father   . Hyperlipidemia Mother   . Hypertension Mother   . Diabetes Neg Hx   . Colon cancer Neg Hx   . Breast cancer Neg Hx   . Coronary artery disease Neg Hx   . Cancer Neg Hx     breast or colon    Review of Systems:  As stated in the HPI and otherwise negative.   BP 122/60   Pulse 72   Ht 5\' 2"  (1.575 m)   Wt 105 lb (  47.6 kg)   BMI 19.20 kg/m   Physical Examination: General: Well developed, well nourished, NAD  HEENT: OP clear, mucus membranes moist  SKIN: warm, dry. No rashes. Neuro: No focal deficits  Musculoskeletal: Muscle strength 5/5 all ext  Psychiatric: Mood and affect normal  Neck: No JVD, no carotid bruits, no thyromegaly, no lymphadenopathy.  Lungs:Clear bilaterally, no wheezes, rhonci, crackles Cardiovascular: Regular rate and rhythm. No murmurs, gallops or rubs. Abdomen:Soft. Bowel sounds present. Non-tender.  Extremities: No lower extremity edema. Pulses are 2 + in the bilateral DP/PT.  Echo 08/20/16: Left ventricle: The cavity size was normal. Systolic function was   normal. The estimated ejection fraction was in the range of 60%   to 65%. Wall motion was normal; there were no regional wall   motion abnormalities. Left ventricular diastolic function   parameters were normal. - Aortic valve: Transvalvular velocity was within the normal range.   There was no stenosis. There was moderate regurgitation. - Mitral valve: Transvalvular velocity was within the normal range.   There was no evidence for stenosis. There was no regurgitation. - Right ventricle: The cavity size was normal. Wall thickness was   normal. Systolic function was normal. - Atrial septum: No defect or patent foramen ovale was identified   by color flow Doppler. - Tricuspid valve: There was trivial regurgitation. - Pulmonary arteries: Systolic pressure was within the normal   range. PA peak pressure: 17  mm Hg (S).  EKG:  EKG is ordered today. The ekg ordered today demonstrates sinus, 72 bpm  Recent Labs: 10/04/2015: Hemoglobin 13.2; Platelets 179.0; TSH 1.84 02/15/2016: ALT 14 02/21/2016: BUN 22; Creatinine, Ser 0.90; Potassium 5.0; Sodium 139   Lipid Panel    Component Value Date/Time   CHOL 202 (H) 10/04/2015 0755   TRIG 71.0 10/04/2015 0755   TRIG 66 06/25/2006 0741   HDL 71.20 10/04/2015 0755   CHOLHDL 3 10/04/2015 0755   VLDL 14.2 10/04/2015 0755   LDLCALC 116 (H) 10/04/2015 0755     Wt Readings from Last 3 Encounters:  08/27/16 105 lb (47.6 kg)  08/15/16 105 lb 2 oz (47.7 kg)  08/14/16 107 lb (48.5 kg)     Other studies Reviewed: Additional studies/ records that were reviewed today include:  Review of the above records demonstrates:    Assessment and Plan:   1. Palpitations: She is known to have PACs/PVCs. Event monitor 2013 with PVCs. No change recently. Rare palpitations. Avoid stimulants.   2. Aortic valve insufficiency: Moderate by echo March 2018. Repeat echo March 2019.  3. Carotid artery disease: Mild disease by u/s August 2017. Will repeat August 2020.   Current medicines are reviewed at length with the patient today.  The patient does not have concerns regarding medicines.  The following changes have been made:  no change  Labs/ tests ordered today include:   Orders Placed This Encounter  Procedures  . EKG 12-Lead  . ECHOCARDIOGRAM COMPLETE     Disposition:   FU with me in 12  months   Signed, Lauree Chandler, MD 08/27/2016 10:06 AM    [facility] [address], [place], [place]  [address] Phone: [phone]; Fax: [phone]

## 2016-08-27 ENCOUNTER — Ambulatory Visit (INDEPENDENT_AMBULATORY_CARE_PROVIDER_SITE_OTHER): Payer: BLUE CROSS/BLUE SHIELD | Admitting: Cardiovascular Disease

## 2016-08-27 ENCOUNTER — Encounter: Payer: Self-pay | Admitting: Cardiovascular Disease

## 2016-08-27 VITALS — BP 122/60 | HR 72 | Ht 62.0 in | Wt 105.0 lb

## 2016-08-27 DIAGNOSIS — I493 Ventricular premature depolarization: Secondary | ICD-10-CM | POA: Diagnosis not present

## 2016-08-27 DIAGNOSIS — I779 Disorder of arteries and arterioles, unspecified: Secondary | ICD-10-CM | POA: Diagnosis not present

## 2016-08-27 DIAGNOSIS — I351 Nonrheumatic aortic (valve) insufficiency: Secondary | ICD-10-CM | POA: Diagnosis not present

## 2016-08-27 DIAGNOSIS — I739 Peripheral vascular disease, unspecified: Secondary | ICD-10-CM

## 2016-08-27 NOTE — Patient Instructions (Signed)
Medication Instructions:  Your physician recommends that you continue on your current medications as directed. Please refer to the Current Medication list given to you today.   Labwork: none  Testing/Procedures: Your physician has requested that you have an echocardiogram. Echocardiography is a painless test that uses sound waves to create images of your heart. It provides your doctor with information about the size and shape of your heart and how well your heart's chambers and valves are working. This procedure takes approximately one hour. There are no restrictions for this procedure. To be done in one year  Follow-Up: Your physician recommends that you schedule a follow-up appointment in: 12 months. (after echo).  Please call our office in about 9 months to schedule this appointment.     Any Other Special Instructions Will Be Listed Below (If Applicable).     If you need a refill on your cardiac medications before your next appointment, please call your pharmacy.

## 2016-09-04 ENCOUNTER — Ambulatory Visit: Payer: BLUE CROSS/BLUE SHIELD | Admitting: Family Medicine

## 2016-09-05 ENCOUNTER — Telehealth: Payer: Self-pay

## 2016-09-05 ENCOUNTER — Telehealth: Payer: Self-pay | Admitting: Internal Medicine

## 2016-09-05 NOTE — Telephone Encounter (Signed)
Pt was supposed to get some Pennsaid, not sure if you can write a RX for it or it has to be ordered. [facility] [address] [phone]

## 2016-09-05 NOTE — Telephone Encounter (Signed)
Called and gave patient the results of her Strep Culture (negative) and will place a copy in the mail for her.

## 2016-09-06 MED ORDER — DICLOFENAC SODIUM 2 % TD SOLN: 2.0000 "application " | Freq: Two times a day (BID) | TRANSDERMAL | 3 refills | Status: DC

## 2016-09-06 NOTE — Telephone Encounter (Signed)
Not coming [facility] [place], instead [facility] [place] much cheaper.  [phone] Have her call and they will mail it.

## 2016-09-10 ENCOUNTER — Ambulatory Visit: Payer: Self-pay | Admitting: Internal Medicine

## 2016-09-12 ENCOUNTER — Other Ambulatory Visit (INDEPENDENT_AMBULATORY_CARE_PROVIDER_SITE_OTHER): Payer: BLUE CROSS/BLUE SHIELD

## 2016-09-12 ENCOUNTER — Ambulatory Visit (INDEPENDENT_AMBULATORY_CARE_PROVIDER_SITE_OTHER): Payer: BLUE CROSS/BLUE SHIELD | Admitting: Internal Medicine

## 2016-09-12 ENCOUNTER — Encounter: Payer: Self-pay | Admitting: Internal Medicine

## 2016-09-12 VITALS — BP 110/62 | HR 65 | Temp 98.4°F | Resp 16 | Ht 62.0 in | Wt 104.0 lb

## 2016-09-12 DIAGNOSIS — Z0000 Encounter for general adult medical examination without abnormal findings: Secondary | ICD-10-CM

## 2016-09-12 LAB — LIPID PANEL
CHOLESTEROL: 225 mg/dL — AB (ref 0–200)
HDL: 71.8 mg/dL (ref >39.00)
LDL CALC: 136 mg/dL — AB (ref 0–99)
NonHDL: 153.05
TRIGLYCERIDES: 85 mg/dL (ref 0.0–149.0)
Total CHOL/HDL Ratio: 3
VLDL: 17 mg/dL (ref 0.0–40.0)

## 2016-09-12 LAB — BASIC METABOLIC PANEL
BUN: 17 mg/dL (ref 6–23)
CHLORIDE: 104 meq/L (ref 96–112)
CO2: 30 meq/L (ref 19–32)
Calcium: 10.1 mg/dL (ref 8.4–10.5)
Creatinine, Ser: 0.83 mg/dL (ref 0.40–1.20)
GFR: 73.59 mL/min (ref >60.00)
GLUCOSE: 93 mg/dL (ref 70–99)
POTASSIUM: 4.8 meq/L (ref 3.5–5.1)
Sodium: 140 mEq/L (ref 135–145)

## 2016-09-12 LAB — CBC WITH DIFFERENTIAL/PLATELET
BASOS PCT: 1.4 % (ref 0.0–3.0)
Basophils Absolute: 0.1 10*3/uL (ref 0.0–0.1)
Eosinophils Absolute: 0.2 10*3/uL (ref 0.0–0.7)
Eosinophils Relative: 4.5 % (ref 0.0–5.0)
HCT: 38.8 % (ref 36.0–46.0)
Hemoglobin: 13.2 g/dL (ref 12.0–15.0)
LYMPHS ABS: 1.4 10*3/uL (ref 0.7–4.0)
Lymphocytes Relative: 28.4 % (ref 12.0–46.0)
MCHC: 34 g/dL (ref 30.0–36.0)
MCV: 91 fl (ref 78.0–100.0)
Monocytes Absolute: 0.4 10*3/uL (ref 0.1–1.0)
Monocytes Relative: 9 % (ref 3.0–12.0)
NEUTROS PCT: 56.7 % (ref 43.0–77.0)
Neutro Abs: 2.8 10*3/uL (ref 1.4–7.7)
Platelets: 180 10*3/uL (ref 150.0–400.0)
RBC: 4.27 Mil/uL (ref 3.87–5.11)
RDW: 12.7 % (ref 11.5–15.5)
WBC: 5 10*3/uL (ref 4.0–10.5)

## 2016-09-12 LAB — URINALYSIS, ROUTINE W REFLEX MICROSCOPIC
Bilirubin Urine: NEGATIVE
Ketones, ur: NEGATIVE
LEUKOCYTES UA: NEGATIVE
NITRITE: NEGATIVE
Specific Gravity, Urine: 1.02 (ref 1.000–1.030)
TOTAL PROTEIN, URINE-UPE24: NEGATIVE
URINE GLUCOSE: NEGATIVE
Urobilinogen, UA: 0.2 (ref 0.0–1.0)
pH: 6 (ref 5.0–8.0)

## 2016-09-12 LAB — HEPATIC FUNCTION PANEL
ALBUMIN: 4.5 g/dL (ref 3.5–5.2)
ALT: 11 U/L (ref 0–35)
AST: 14 U/L (ref 0–37)
Alkaline Phosphatase: 67 U/L (ref 39–117)
Bilirubin, Direct: 0.1 mg/dL (ref 0.0–0.3)
Total Bilirubin: 0.7 mg/dL (ref 0.2–1.2)
Total Protein: 7.3 g/dL (ref 6.0–8.3)

## 2016-09-12 LAB — TSH: TSH: 1.52 u[IU]/mL (ref 0.35–4.50)

## 2016-09-12 MED ORDER — PANTOPRAZOLE SODIUM 40 MG PO TBEC: 40.0000 mg | DELAYED_RELEASE_TABLET | Freq: Every day | ORAL | 3 refills | Status: DC

## 2016-09-12 MED ORDER — CEFDINIR 300 MG PO CAPS: 300.0000 mg | ORAL_CAPSULE | Freq: Two times a day (BID) | ORAL | 0 refills | Status: DC

## 2016-09-12 NOTE — Patient Instructions (Signed)
Shingrix

## 2016-09-12 NOTE — Progress Notes (Signed)
Pre-visit discussion using our clinic review tool. No additional management support is needed unless otherwise documented below in the visit note.  

## 2016-09-12 NOTE — Assessment & Plan Note (Signed)
We discussed age appropriate health related issues, including available/recomended screening tests and vaccinations. We discussed a need for adhering to healthy diet and exercise. Labs/EKG were reviewed/ordered. All questions were answered.  No PAP Labs Shingrix discussed

## 2016-09-12 NOTE — Telephone Encounter (Signed)
Called patient. She has received her Pennsaid.

## 2016-09-12 NOTE — Progress Notes (Signed)
Subjective:  Patient ID: [name], female    DOB: 1950/07/10  Age: 66 y.o. MRN: [id number]  CC: Annual Exam   HPI Frank Pilger presents for a well exam  Outpatient Medications Prior to Visit  Medication Sig Dispense Refill  . ALPRAZolam (XANAX) 0.5 MG tablet May take 1 tablet 30 mins prior to MRI and 1 tablet before entering MRI if needed. 2 tablet 0  . ALREX 0.2 % SUSP Apply 0.2 drops to eye as needed (allergies).     . Botulinum Toxin Type A, Cosm, (BOTOX COSMETIC) 50 UNITS SOLR Inject 4-6 Units into the muscle. 3 month  injection    . conjugated estrogens (PREMARIN) vaginal cream INSERT 1 GRAM VAGINALLY ONCE A WEEK AS DIRECTED 30 g 3  . Cyanocobalamin (VITAMIN B-12 PO) Take 1 tablet by mouth daily.    . Diclofenac Sodium (PENNSAID) 2 % SOLN Place 2 application onto the skin 2 (two) times daily. 112 g 3  . EPIPEN 2-PAK 0.3 MG/0.3ML SOAJ injection See admin instructions. Reported on 10/08/2015  1  . fexofenadine (ALLEGRA ALLERGY CHILDRENS) 30 MG tablet Take 30 mg by mouth 2 (two) times daily as needed (allergies).    . fluocinonide cream (LIDEX) 6.75 % Apply 1 application topically 2 (two) times daily. 90 g 2  . fluticasone (FLONASE) 50 MCG/ACT nasal spray Place 1 spray into both nostrils as needed for allergies.   5  . Hyaluronic Acid (RESTYLANE IJ) Inject as directed every 6 (six) months.    . meclizine (ANTIVERT) 25 MG tablet Take 1 tablet (25 mg total) by mouth 3 (three) times daily as needed for dizziness. 30 tablet 0  . Multiple Vitamins-Minerals (ICAPS AREDS 2) CAPS Take 1 capsule by mouth daily. Reported on 10/08/2015    . ondansetron (ZOFRAN) 4 MG tablet Take 1 tablet (4 mg total) by mouth every 8 (eight) hours as needed for nausea or vomiting. 15 tablet 0  . pantoprazole (PROTONIX) 40 MG tablet Take 1 tablet (40 mg total) by mouth daily. 30 tablet 11  . tretinoin (RETIN-A) 0.05 % cream Apply 1 application topically at bedtime.    . triamcinolone cream (KENALOG)  0.1 % Apply 1 application topically 3 (three) times daily. 30 g 0  . Vitamin D, Ergocalciferol, (DRISDOL) 50000 units CAPS capsule Take 1 capsule (50,000 Units total) by mouth every 7 (seven) days. 12 capsule 0   No facility-administered medications prior to visit.     ROS Review of Systems  Constitutional: Negative for activity change, appetite change, chills, fatigue and unexpected weight change.  HENT: Negative for congestion, mouth sores and sinus pressure.   Eyes: Positive for visual disturbance.       Floaters   Respiratory: Negative for cough and chest tightness.   Gastrointestinal: Negative for abdominal pain and nausea.  Genitourinary: Negative for difficulty urinating, frequency and vaginal pain.  Musculoskeletal: Negative for back pain and gait problem.  Skin: Negative for pallor and rash.  Neurological: Negative for dizziness, tremors, weakness, numbness and headaches.  Psychiatric/Behavioral: Negative for confusion and sleep disturbance.    Objective:  There were no vitals taken for this visit.  BP Readings from Last 3 Encounters:  08/27/16 122/60  08/15/16 104/70  08/14/16 128/72    Wt Readings from Last 3 Encounters:  08/27/16 105 lb (47.6 kg)  08/15/16 105 lb 2 oz (47.7 kg)  08/14/16 107 lb (48.5 kg)    Physical Exam  Constitutional: She appears well-developed. No distress.  HENT:  Head:  Normocephalic.  Right Ear: External ear normal.  Left Ear: External ear normal.  Nose: Nose normal.  Mouth/Throat: Oropharynx is clear and moist.  Eyes: Conjunctivae are normal. Pupils are equal, round, and reactive to light. Right eye exhibits no discharge. Left eye exhibits no discharge.  Neck: Normal range of motion. Neck supple. No JVD present. No tracheal deviation present. No thyromegaly present.  Cardiovascular: Normal rate, regular rhythm and normal heart sounds.   Pulmonary/Chest: No stridor. No respiratory distress. She has no wheezes.  Abdominal: Soft. Bowel  sounds are normal. She exhibits no distension and no mass. There is no tenderness. There is no rebound and no guarding.  Musculoskeletal: She exhibits no edema or tenderness.  Lymphadenopathy:    She has no cervical adenopathy.  Neurological: She displays normal reflexes. No cranial nerve deficit. She exhibits normal muscle tone. Coordination normal.  Skin: No rash noted. No erythema.  Psychiatric: She has a normal mood and affect. Her behavior is normal. Judgment and thought content normal.  breasts - WNL B, no adenopathy PAP - declined Slight murmur  Lab Results  Component Value Date   WBC 4.2 10/04/2015   HGB 13.2 10/04/2015   HCT 38.8 10/04/2015   PLT 179.0 10/04/2015   GLUCOSE 80 02/21/2016   CHOL 202 (H) 10/04/2015   TRIG 71.0 10/04/2015   HDL 71.20 10/04/2015   LDLCALC 116 (H) 10/04/2015   ALT 14 02/15/2016   AST 19 02/15/2016   NA 139 02/21/2016   K 5.0 02/21/2016   CL 105 02/21/2016   CREATININE 0.90 02/21/2016   BUN 22 02/21/2016   CO2 29 02/21/2016   TSH 1.84 10/04/2015    Dg Tibia/fibula Left  Result Date: 08/14/2016 CLINICAL DATA:  Anterior and medial tenderness over the mid tibial region for the past week without known injury. EXAM: LEFT TIBIA AND FIBULA - 2 VIEW COMPARISON:  None in PACs FINDINGS: The left tibia and fibula are adequately mineralized. There is no lytic or blastic lesion. There is no periosteal reaction. The overlying soft tissues are normal. There is an rounded bony density adjacent to the tip of the lateral malleolus which may reflect an accessory ossicle or old avulsion fracture fragment. The observed portions of the knee exhibit no acute abnormalities. IMPRESSION: No acute or significant chronic bony abnormality in the area of clinical concern. These soft tissues are unremarkable as well. Electronically Signed   By: David  Martinique M.D.   On: 08/14/2016 15:40    Assessment & Plan:   There are no diagnoses linked to this encounter. I am having  Ms. Villagomez maintain her Botulinum Toxin Type A (Cosm), ALREX, Hyaluronic Acid (RESTYLANE IJ), fluticasone, tretinoin, fluocinonide cream, EPIPEN 2-PAK, Cyanocobalamin (VITAMIN B-12 PO), ICAPS AREDS 2, fexofenadine, conjugated estrogens, meclizine, pantoprazole, ALPRAZolam, triamcinolone cream, Vitamin D (Ergocalciferol), ondansetron, and Diclofenac Sodium.  No orders of the defined types were placed in this encounter.    Follow-up: No Follow-up on file.  [name], MD

## 2016-09-12 NOTE — Telephone Encounter (Signed)
Fwd response to [employer]

## 2016-09-15 ENCOUNTER — Other Ambulatory Visit: Payer: Self-pay | Admitting: Internal Medicine

## 2016-10-11 ENCOUNTER — Telehealth: Payer: Self-pay | Admitting: Neurology

## 2016-10-11 NOTE — Telephone Encounter (Signed)
Pt called she is wanting to pursue having MRI but she is wanting to have the order to include the neck. Said she's had neck problems for years but she would like to see if that could be causing the HA's. An appt was scheduled for 7/3 to discuss with provider. She is wanting to know if she could be seen sooner or if this could be added to current order. She is aware ins will need to PA since order is from 2017.  Please call to

## 2016-10-16 ENCOUNTER — Encounter: Payer: Self-pay | Admitting: Internal Medicine

## 2016-10-16 ENCOUNTER — Other Ambulatory Visit: Payer: Self-pay | Admitting: Internal Medicine

## 2016-10-16 ENCOUNTER — Ambulatory Visit (INDEPENDENT_AMBULATORY_CARE_PROVIDER_SITE_OTHER): Payer: BLUE CROSS/BLUE SHIELD | Admitting: Family Medicine

## 2016-10-16 ENCOUNTER — Ambulatory Visit (INDEPENDENT_AMBULATORY_CARE_PROVIDER_SITE_OTHER): Payer: BLUE CROSS/BLUE SHIELD | Admitting: Internal Medicine

## 2016-10-16 ENCOUNTER — Encounter: Payer: Self-pay | Admitting: Family Medicine

## 2016-10-16 VITALS — BP 100/62 | HR 74 | Resp 16 | Wt 104.0 lb

## 2016-10-16 VITALS — BP 106/54 | HR 80 | Ht 61.5 in | Wt 103.6 lb

## 2016-10-16 DIAGNOSIS — K581 Irritable bowel syndrome with constipation: Secondary | ICD-10-CM | POA: Diagnosis not present

## 2016-10-16 DIAGNOSIS — M542 Cervicalgia: Secondary | ICD-10-CM | POA: Diagnosis not present

## 2016-10-16 DIAGNOSIS — M5010 Cervical disc disorder with radiculopathy, unspecified cervical region: Secondary | ICD-10-CM | POA: Diagnosis not present

## 2016-10-16 DIAGNOSIS — R198 Other specified symptoms and signs involving the digestive system and abdomen: Secondary | ICD-10-CM

## 2016-10-16 DIAGNOSIS — F458 Other somatoform disorders: Secondary | ICD-10-CM | POA: Diagnosis not present

## 2016-10-16 DIAGNOSIS — R0989 Other specified symptoms and signs involving the circulatory and respiratory systems: Secondary | ICD-10-CM

## 2016-10-16 DIAGNOSIS — K648 Other hemorrhoids: Secondary | ICD-10-CM

## 2016-10-16 DIAGNOSIS — K219 Gastro-esophageal reflux disease without esophagitis: Secondary | ICD-10-CM | POA: Diagnosis not present

## 2016-10-16 MED ORDER — ALPRAZOLAM 0.25 MG PO TABS: ORAL_TABLET | ORAL | 1 refills | Status: DC

## 2016-10-16 NOTE — Telephone Encounter (Signed)
Pls call in Xanax - see Rx Thx

## 2016-10-16 NOTE — Telephone Encounter (Signed)
[facility]. Called pt no answer LMOM [facility].../lmb

## 2016-10-16 NOTE — Progress Notes (Signed)
HISTORY OF PRESENT ILLNESS:  [name] is a 66 y.o. female with past medical history as listed below who presents herself today with a chief complaint of fullness sensation in throat. Also questions on PPI therapy. I last saw the patient August 2015 at which time she had been treated for pulmonary symptoms with a myriad of therapies. At one point she discontinue PPI with what sounded like acid rebound. She was advised with regards to weaning off medication. She did see the GI physician assistant November 2016 for minor rectal bleeding felt secondary to hemorrhoids. She was treated and responded. Current complaints are that of throat tightness. No true dysphagia though she does notice that a peanut butter sandwich is somewhat dry and slow to move after swallowing. Otherwise no issues. She does report significant anxiety regarding the care of her mother. She has questions regarding facial Botox therapy for which I deferred to her to her cosmetic surgeon. She denies weight loss, chest pain, melena, or hematochezia. Chronic alternating bowel habits with a predominance toward constipation persists. No change. Remote upper endoscopy was normal. Last colonoscopy 2014 was normal. Internal hemorrhoids present.  REVIEW OF SYSTEMS:  All non-GI ROS negative except for sinus allergy, headaches, anxiety  Past Medical History:  Diagnosis Date  . Allergic rhinitis, cause unspecified   . Backache, unspecified   . Benign fasciculation-cramp syndrome ([place]) 12/03/2012    Worsening with  stress, fatigue .  [name] Calculus of kidney   . Conversion disorder   . Cotton wool spots   . Esophageal reflux   . Floater, vitreous, left 05/2016  . GERD (gastroesophageal reflux disease)   . Hemorrhage of rectum and anus   . Internal hemorrhoids without mention of complication   . Irritable bowel syndrome   . Microscopic hematuria   . Migraine headache with aura   . Mild aortic insufficiency   . Mild tricuspid  regurgitation   . Osteopenia   . Other plastic surgery for unacceptable cosmetic appearance    Inj tx fllers/expander  . Personal history of urinary calculi   . Postmenopausal atrophic vaginitis   . Premature atrial contractions   . PVC (premature ventricular contraction)    a. Event monitor 2013.  [name] Unspecified constipation   . Vasovagal syncope     Past Surgical History:  Procedure Laterality Date  . BREAST BIOPSY Right   . Cosmetic Procedures w/Injection therapy    . SKIN CANCER EXCISION Left    thigh  . WISDOM TOOTH EXTRACTION      Social History [name]  reports that she has never smoked. She has never used smokeless tobacco. She reports that she does not drink alcohol or use drugs.  family history includes COPD in her father; Hyperlipidemia in her father and mother; Hypertension in her father and mother.  Allergies  Allergen Reactions  . Codeine Other (See Comments)    Doesn't remeber  . Doxycycline Other (See Comments)  . Epinephrine     Heart racing   . Neomycin     Topical rash from cream  . Penicillins Hives and Other (See Comments)    Has patient had a PCN reaction causing immediate rash, facial/tongue/throat swelling, SOB or lightheadedness with hypotension: Yes Has patient had a PCN reaction causing severe rash involving mucus membranes or skin necrosis: Yes Has patient had a PCN reaction that required hospitalization No Has patient had a PCN reaction occurring within the last 10 years: No If all of the above answers are "NO",  then may proceed with Cephalosporin use.   . Sulfonamide Derivatives Other (See Comments)    Doesn't remember   . Zithromax [Azithromycin] Rash       PHYSICAL EXAMINATION:Vital signs: BP (!) 106/54   Pulse 80   Ht 5' 1.5" (1.562 m)   Wt 103 lb 9.6 oz (47 kg)   BMI 19.26 kg/m   Constitutional: generally well-appearing, no acute distress Psychiatric: alert and oriented x3, cooperative Eyes: extraocular movements  intact, anicteric, conjunctiva pink Mouth: oral pharynx moist, no lesions. Normal posterior pharynx Neck: supple without thyromegaly Lymph: no supraclavicular lymphadenopathy Cardiovascular: heart regular rate and rhythm, no murmur Lungs: clear to auscultation bilaterally Abdomen: soft, nontender, nondistended, no obvious ascites, no peritoneal signs, normal bowel sounds, no organomegaly Rectal:Omitted Extremities: no clubbing cyanosis or lower extremity edema bilaterally Skin: no lesions on visible extremities Neuro: No focal deficits. Cranial nerves intact   ASSESSMENT:  #1. Globus sensation. Likely secondary to anxiety. May be seasonal allergies. Less likely GERD. No true esophageal dysphagia. Remote EGD normal #2. GERD. Discussed chronic PPI use and alternatives. No active symptoms on current regimen #3. Constipation predominant IBS. Ongoing #4. Normal screening colonoscopy 2014 #5. History of bleeding internal hemorrhoids. Resolved  PLAN:  #1. Discussion on globus sensation #2. Reassurance #3. For significant anxiety referred back PCP #4. Lowest dose of PPI to control classic GERD symptoms. Weaning strategy discussed as well as 3 years ago #5. Daily fiber supplementation #6. Routine colonoscopy for screening 2024 #7. Interval GI follow-up as needed  25 minutes spent face-to-face with the patient. Greater than 50% a time use for counseling regarding her GI complaints including globus sensation, chronic GERD, PPI weaning strategy, and the need to address non-GI complaints with her appropriate non-GI physicians

## 2016-10-16 NOTE — Telephone Encounter (Signed)
Pt came by and said that Plot was going to give her something for anxiety the last time she was here . She at 1st told him no but she thinks she might needs something

## 2016-10-16 NOTE — Telephone Encounter (Signed)
I called pt to discuss. No answer, her VM is full, unable to leave a message. Will call her again later.

## 2016-10-16 NOTE — Telephone Encounter (Signed)
Patient needs to be seen again before MRI can be ordered. I will offer an MRI brain and neck spine again, but she needs a RV with NP as I am booked.

## 2016-10-16 NOTE — Telephone Encounter (Signed)
Please advise 

## 2016-10-16 NOTE — Progress Notes (Signed)
Pre-visit discussion using our clinic review tool. No additional management support is needed unless otherwise documented below in the visit note.  

## 2016-10-16 NOTE — Patient Instructions (Signed)
goto see you  Sleep study should be good  Ice can help  Tennis ball between shoulder blades with sitting a long time in the car 2 tennis balls in a tube sock and lay on them where head meets neck.  pennsaid pinkie amount topically 2 times daily as needed.  Keep hands within peripheral vision  Send me a message in 2 weeks and tell me how you are doing.

## 2016-10-16 NOTE — Assessment & Plan Note (Signed)
Patient is not having any significant radicular symptoms at this time and continues to have pain. Discussed with patient at great length. We discussed which activities doing which ones to avoid. Patient will start making changes. We discussed ergonomic changes at work and may be beneficial as well. Patient declined formal physical therapy at this moment, and declined manipulation. We will see how patient does and if anything further is necessary such as an MRI if any radicular symptoms occur again. Spent  25 minutes with patient face-to-face and had greater than 50% of counseling including as described above in assessment and plan.

## 2016-10-16 NOTE — Patient Instructions (Signed)
Please follow up as needed 

## 2016-10-16 NOTE — Progress Notes (Signed)
[name] [address], [place] [address] Phone: [phone] Subjective:    CC: neck pain   [id number]  [name] is a 66 y.o. female coming in with complaint of Neck pain. This is a new for patient. Patient states it seems to be giving her more difficulty with daily activities such as work. Patient states and she is noticing that can hurt her when she is looking up for long amount of time when looking down. Patient denies any radiation down the arms or any tingling. Patient denies any weakness. States though that the pain can be very intensely keeps her from falling asleep. Patient has tried to change her hand position when she is working out and had made a little improvement for some time but now does not seem to be helping quite some much. Has not tried any over-the-counter medications. Continues to stay active working out at least 7-10 times a week.     Past Medical History:  Diagnosis Date  . Allergic rhinitis, cause unspecified   . Backache, unspecified   . Benign fasciculation-cramp syndrome ([place]) 12/03/2012    Worsening with  stress, fatigue .  [name] Calculus of kidney   . Conversion disorder   . Cotton wool spots   . Esophageal reflux   . Floater, vitreous, left 05/2016  . GERD (gastroesophageal reflux disease)   . Hemorrhage of rectum and anus   . Internal hemorrhoids without mention of complication   . Irritable bowel syndrome   . Microscopic hematuria   . Migraine headache with aura   . Mild aortic insufficiency   . Mild tricuspid regurgitation   . Osteopenia   . Other plastic surgery for unacceptable cosmetic appearance    Inj tx fllers/expander  . Personal history of urinary calculi   . Postmenopausal atrophic vaginitis   . Premature atrial contractions   . PVC (premature ventricular contraction)    a. Event monitor 2013.  [name] Unspecified constipation   . Vasovagal syncope    Past Surgical History:  Procedure  Laterality Date  . BREAST BIOPSY Right   . Cosmetic Procedures w/Injection therapy    . SKIN CANCER EXCISION Left    thigh  . WISDOM TOOTH EXTRACTION     Social History   Social History  . Marital status: Married    Spouse name: [name]  . Number of children: 0  . Years of education: 14   Occupational History  . 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 [employer]   . [employer]   Social History Main Topics  . Smoking status: Never Smoker  . Smokeless tobacco: Never Used  . Alcohol use No  . Drug use: No  . Sexual activity: Not on file   Other Topics Concern  . Not on file   Social History Narrative   [employer]  in [place]. Married '90 - marriage is in very good shape '12, No children.  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 Offerred medical services for them if needed (Nov '11)   Patient is married [name]) and lives at home with her husband.   Patient is working Administrator, arts work.   Patient has a high school education.   Patient is right-handed.   Patient does not drink any caffeine.         Allergies  Allergen Reactions  . Codeine  Other (See Comments)    Doesn't remeber  . Doxycycline Other (See Comments)  . Epinephrine     Heart racing   . Neomycin     Topical rash from cream  . Penicillins Hives and Other (See Comments)    Has patient had a PCN reaction causing immediate rash, facial/tongue/throat swelling, SOB or lightheadedness with hypotension: Yes Has patient had a PCN reaction causing severe rash involving mucus membranes or skin necrosis: Yes Has patient had a PCN reaction that required hospitalization No Has patient had a PCN reaction occurring within the last 10 years: No If all of the above answers are "NO", then may proceed with Cephalosporin use.   . Sulfonamide Derivatives Other (See Comments)    Doesn't remember   .  Zithromax [Azithromycin] Rash   Family History  Problem Relation Age of Onset  . Hyperlipidemia Father   . COPD Father     Smoker, deceased 07-14-14  . Hypertension Father   . Hyperlipidemia Mother   . Hypertension Mother   . Diabetes Neg Hx   . Colon cancer Neg Hx   . Breast cancer Neg Hx   . Coronary artery disease Neg Hx   . Cancer Neg Hx     breast or colon    Past medical history, social, surgical and family history all reviewed in electronic medical record.  No pertanent information unless stated regarding to the chief complaint.   Review of Systems:Review of systems updated and as accurate as of 10/16/16  No headache, visual changes, nausea, vomiting, diarrhea, constipation, dizziness, abdominal pain, skin rash, fevers, chills, night sweats, weight loss, swollen lymph nodes, body aches, joint swelling, muscle aches, chest pain, shortness of breath, mood changes.   Objective  There were no vitals taken for this visit. Systems examined below as of 10/16/16   General: No apparent distress alert and oriented x3 mood and affect normal, dressed appropriately.  HEENT: Pupils equal, extraocular movements intact  Respiratory: Patient's speak in full sentences and does not appear short of breath  Cardiovascular: No lower extremity edema, non tender, no erythema  Skin: Warm dry intact with no signs of infection or rash on extremities or on axial skeleton.  Abdomen: Soft nontender  Neuro: Cranial nerves II through XII are intact, neurovascularly intact in all extremities with 2+ DTRs and 2+ pulses.  Lymph: No lymphadenopathy of posterior or anterior cervical chain or axillae bilaterally.  Gait normal with good balance and coordination.  MSK:  Non tender with full range of motion and good stability and symmetric strength and tone of shoulders, elbows, wrist, hip, knee and ankles bilaterally.  Neck: Inspection unremarkable. No palpable stepoffs. Negative Spurling's maneuver. Mild  limitation lacking the last 5 of extension as well as left degrees of side bending to the left greater than the right Grip strength and sensation normal in bilateral hands Strength good C4 to T1 distribution No sensory change to C4 to T1 Negative Hoffman sign bilaterally Reflexes normal    Impression and Recommendations:     This case required medical decision making of moderate complexity.      Note: This dictation was prepared with Dragon dictation along with smaller phrase technology. Any transcriptional errors that result from this process are unintentional.

## 2016-10-17 NOTE — Telephone Encounter (Signed)
I called pt again, no answer, VM was full, unable to leave a message. Will try again later.

## 2016-10-18 NOTE — Telephone Encounter (Signed)
I called pt. I advised her that she would need an appt with the NP before we can order more MRIs. Pt says that she has an appt in July with Dr. Brett Fairy and wants to keep that appt and will discuss the MRIs, and possibly a sleep study at that time. Pt verbalized understanding of appt date and time.

## 2016-10-31 ENCOUNTER — Ambulatory Visit: Payer: BLUE CROSS/BLUE SHIELD | Admitting: Internal Medicine

## 2016-11-05 ENCOUNTER — Encounter: Payer: Self-pay | Admitting: Internal Medicine

## 2016-11-05 ENCOUNTER — Ambulatory Visit (INDEPENDENT_AMBULATORY_CARE_PROVIDER_SITE_OTHER): Payer: BLUE CROSS/BLUE SHIELD | Admitting: Internal Medicine

## 2016-11-05 DIAGNOSIS — R3129 Other microscopic hematuria: Secondary | ICD-10-CM | POA: Diagnosis not present

## 2016-11-05 NOTE — Progress Notes (Signed)
Subjective:  Patient ID: [name], female    DOB: [birth year]  Age: 66 y.o. MRN: [id number]  CC: No chief complaint on file.   HPI Grover Woodfield presents for a blood in urine (chronic) - dx'd w/UTI when she was out of town... UA witth trace blood 4 years ago, small  Blood in UA x 3 years. Abd CT w/renal cyst and adrenal cyst in 2016  Outpatient Medications Prior to Visit  Medication Sig Dispense Refill  . ALPRAZolam (XANAX) 0.25 MG tablet 1 po bid prn anxiety 30 tablet 1  . ALREX 0.2 % SUSP Apply 0.2 drops to eye as needed (allergies).     . Botulinum Toxin Type A, Cosm, (BOTOX COSMETIC) 50 UNITS SOLR Inject 4-6 Units into the muscle. 3 month  injection    . Cyanocobalamin (VITAMIN B-12 PO) Take 1 tablet by mouth daily.    . Diclofenac Sodium (PENNSAID) 2 % SOLN Place 2 application onto the skin 2 (two) times daily. 112 g 3  . EPIPEN 2-PAK 0.3 MG/0.3ML SOAJ injection See admin instructions. Reported on 10/08/2015  1  . fexofenadine (ALLEGRA ALLERGY CHILDRENS) 30 MG tablet Take 30 mg by mouth 2 (two) times daily as needed (allergies).    . fluocinonide cream (LIDEX) 9.16 % Apply 1 application topically 2 (two) times daily. 90 g 2  . fluticasone (FLONASE) 50 MCG/ACT nasal spray Place 1 spray into both nostrils as needed for allergies.   5  . Hyaluronic Acid (RESTYLANE IJ) Inject as directed every 6 (six) months.    . meclizine (ANTIVERT) 25 MG tablet Take 1 tablet (25 mg total) by mouth 3 (three) times daily as needed for dizziness. 30 tablet 0  . Multiple Vitamins-Minerals (ICAPS AREDS 2) CAPS Take 1 capsule by mouth daily. Reported on 10/08/2015    . pantoprazole (PROTONIX) 40 MG tablet Take 1 tablet (40 mg total) by mouth daily. 90 tablet 3  . PREMARIN vaginal cream INSERT 1 GRAM VAGINALLY ONCE A WEEK AS DIRECTED 30 g 1  . tretinoin (RETIN-A) 0.05 % cream Apply 1 application topically at bedtime.    . triamcinolone cream (KENALOG) 0.1 % Apply 1 application topically 3 (three)  times daily. 30 g 0  . Vitamin D, Ergocalciferol, (DRISDOL) 50000 units CAPS capsule Take 1 capsule (50,000 Units total) by mouth every 7 (seven) days. 12 capsule 0   No facility-administered medications prior to visit.     ROS Review of Systems  Constitutional: Negative for activity change, appetite change, chills, fatigue and unexpected weight change.  HENT: Negative for congestion, mouth sores and sinus pressure.   Eyes: Negative for visual disturbance.  Respiratory: Negative for cough and chest tightness.   Gastrointestinal: Negative for abdominal pain and nausea.  Genitourinary: Negative for difficulty urinating, frequency and vaginal pain.  Musculoskeletal: Negative for back pain and gait problem.  Skin: Negative for pallor and rash.  Neurological: Negative for dizziness, tremors, weakness, numbness and headaches.  Psychiatric/Behavioral: Negative for confusion and sleep disturbance.    Objective:  BP 110/60 (BP Location: Left Arm, Patient Position: Sitting, Cuff Size: Normal)   Pulse 71   Temp 97.8 F (36.6 C) (Oral)   Ht 5' 1.5" (1.562 m)   Wt 105 lb (47.6 kg)   SpO2 99%   BMI 19.52 kg/m   BP Readings from Last 3 Encounters:  11/05/16 110/60  10/16/16 100/62  10/16/16 (!) 106/54    Wt Readings from Last 3 Encounters:  11/05/16 105 lb (47.6 kg)  10/16/16 104 lb (47.2 kg)  10/16/16 103 lb 9.6 oz (47 kg)    Physical Exam  Constitutional: She appears well-developed. No distress.  HENT:  Head: Normocephalic.  Right Ear: External ear normal.  Left Ear: External ear normal.  Nose: Nose normal.  Mouth/Throat: Oropharynx is clear and moist.  Eyes: Conjunctivae are normal. Pupils are equal, round, and reactive to light. Right eye exhibits no discharge. Left eye exhibits no discharge.  Neck: Normal [name] of motion. Neck supple. No JVD present. No tracheal deviation present. No thyromegaly present.  Cardiovascular: Normal rate, regular rhythm and normal heart sounds.    Pulmonary/Chest: No stridor. No respiratory distress. She has no wheezes.  Abdominal: Soft. Bowel sounds are normal. She exhibits no distension and no mass. There is no tenderness. There is no rebound and no guarding.  Musculoskeletal: She exhibits no edema or tenderness.  Lymphadenopathy:    She has no cervical adenopathy.  Neurological: She displays normal reflexes. No cranial nerve deficit. She exhibits normal muscle tone. Coordination normal.  Skin: No rash noted. No erythema.  Psychiatric: She has a normal mood and affect. Her behavior is normal. Judgment and thought content normal.    Lab Results  Component Value Date   WBC 5.0 09/12/2016   HGB 13.2 09/12/2016   HCT 38.8 09/12/2016   PLT 180.0 09/12/2016   GLUCOSE 93 09/12/2016   CHOL 225 (H) 09/12/2016   TRIG 85.0 09/12/2016   HDL 71.80 09/12/2016   LDLCALC 136 (H) 09/12/2016   ALT 11 09/12/2016   AST 14 09/12/2016   NA 140 09/12/2016   K 4.8 09/12/2016   CL 104 09/12/2016   CREATININE 0.83 09/12/2016   BUN 17 09/12/2016   CO2 30 09/12/2016   TSH 1.52 09/12/2016    Dg Tibia/fibula Left  Result Date: 08/14/2016 CLINICAL DATA:  Anterior and medial tenderness over the mid tibial region for the past week without known injury. EXAM: LEFT TIBIA AND FIBULA - 2 VIEW COMPARISON:  None in PACs FINDINGS: The left tibia and fibula are adequately mineralized. There is no lytic or blastic lesion. There is no periosteal reaction. The overlying soft tissues are normal. There is an rounded bony density adjacent to the tip of the lateral malleolus which may reflect an accessory ossicle or old avulsion fracture fragment. The observed portions of the knee exhibit no acute abnormalities. IMPRESSION: No acute or significant chronic bony abnormality in the area of clinical concern. These soft tissues are unremarkable as well. Electronically Signed   By: David  Martinique M.D.   On: 08/14/2016 15:40    Assessment & Plan:   There are no diagnoses  linked to this encounter. I am having Ms. Cohill maintain her Botulinum Toxin Type A (Cosm), ALREX, Hyaluronic Acid (RESTYLANE IJ), fluticasone, tretinoin, fluocinonide cream, EPIPEN 2-PAK, Cyanocobalamin (VITAMIN B-12 PO), ICAPS AREDS 2, fexofenadine, meclizine, triamcinolone cream, Vitamin D (Ergocalciferol), Diclofenac Sodium, pantoprazole, PREMARIN, and ALPRAZolam.  No orders of the defined types were placed in this encounter.    Follow-up: No Follow-up on file.  [name], MD

## 2016-11-05 NOTE — Assessment & Plan Note (Signed)
UA witth trace blood 4 years ago, small  Blood in UA x 3 years. Abd CT w/renal cyst and adrenal cyst in 2016  Urol ref

## 2016-11-13 ENCOUNTER — Ambulatory Visit: Payer: BLUE CROSS/BLUE SHIELD | Admitting: Family Medicine

## 2016-11-20 ENCOUNTER — Encounter: Payer: Self-pay | Admitting: Internal Medicine

## 2016-11-21 ENCOUNTER — Other Ambulatory Visit: Payer: Self-pay | Admitting: Internal Medicine

## 2016-11-21 DIAGNOSIS — R300 Dysuria: Secondary | ICD-10-CM

## 2016-11-21 NOTE — Telephone Encounter (Signed)
This pt called checking on this message that was sent.

## 2016-11-22 ENCOUNTER — Other Ambulatory Visit (INDEPENDENT_AMBULATORY_CARE_PROVIDER_SITE_OTHER): Payer: BLUE CROSS/BLUE SHIELD

## 2016-11-22 DIAGNOSIS — R300 Dysuria: Secondary | ICD-10-CM

## 2016-11-22 LAB — URINALYSIS, ROUTINE W REFLEX MICROSCOPIC
Bilirubin Urine: NEGATIVE
KETONES UR: NEGATIVE
Leukocytes, UA: NEGATIVE
Nitrite: NEGATIVE
SPECIFIC GRAVITY, URINE: 1.025 (ref 1.000–1.030)
Total Protein, Urine: NEGATIVE
Urine Glucose: NEGATIVE
Urobilinogen, UA: 0.2 (ref 0.0–1.0)
pH: 6 (ref 5.0–8.0)

## 2016-11-23 ENCOUNTER — Encounter: Payer: Self-pay | Admitting: Internal Medicine

## 2016-12-18 ENCOUNTER — Ambulatory Visit (INDEPENDENT_AMBULATORY_CARE_PROVIDER_SITE_OTHER): Payer: BLUE CROSS/BLUE SHIELD | Admitting: Neurology

## 2016-12-18 ENCOUNTER — Encounter: Payer: Self-pay | Admitting: Neurology

## 2016-12-18 ENCOUNTER — Ambulatory Visit: Payer: BLUE CROSS/BLUE SHIELD | Admitting: Neurology

## 2016-12-18 VITALS — BP 111/67 | HR 65 | Ht 62.0 in | Wt 103.0 lb

## 2016-12-18 DIAGNOSIS — G43109 Migraine with aura, not intractable, without status migrainosus: Secondary | ICD-10-CM | POA: Diagnosis not present

## 2016-12-18 DIAGNOSIS — H532 Diplopia: Secondary | ICD-10-CM | POA: Diagnosis not present

## 2016-12-18 DIAGNOSIS — R0683 Snoring: Secondary | ICD-10-CM

## 2016-12-18 DIAGNOSIS — R253 Fasciculation: Secondary | ICD-10-CM | POA: Diagnosis not present

## 2016-12-18 DIAGNOSIS — M542 Cervicalgia: Secondary | ICD-10-CM | POA: Diagnosis not present

## 2016-12-18 NOTE — Patient Instructions (Signed)
Magnetic Resonance Imaging  Magnetic resonance imaging (MRI) is a painless test that takes pictures of the inside of your body. This test uses a strong magnet. This test does not use X-rays or radiation.  What happens before the procedure?   You will be asked to take off all metal. This includes:  ? Your watch, jewelry, and other metal items.  ? Some makeup may have very small bits of metal and may need to be taken off.  ? Braces and fillings normally are not a problem.  What happens during the procedure?   You may be given earplugs or headphones to listen to music. The machine can be noisy.   You might get a shot (injection) with a dye (contrast material) to help the MRI take better pictures.   MRI is done in a tunnel-shaped scanner. You will lie on a table that slides into the tunnel-shaped scanner. Once inside, you will still be able to talk to the person doing the test.   You will be asked to hold very still. You will be told when you can shift position. You may have to wait a few minutes to make sure the images are readable.  What happens after the procedure?   You may go back to your normal activities right away.   If you got a shot of dye, it will pass naturally through your body within a day.   Your doctor will talk to you about the results.  This information is not intended to replace advice given to you by your health care provider. Make sure you discuss any questions you have with your health care provider.  Document Released: 07/07/2010 Document Revised: 11/10/2015 Document Reviewed: 07/30/2013  [employer]vier Interactive Patient Education  2018 [employer]

## 2016-12-18 NOTE — Progress Notes (Signed)
[facility]  Provider:  Dr Brett Fairy Referring Provider: [name], [name], MD Primary Care Physician:  Cassandria Anger, MD    HPI:  [name] is a 66 y.o. female here as a patient of  Dr. [name] for follow up on her condition of  benign fasciculation.   I have the pleasure of seeing [name] today on 12/18/2016. She reports that she has still freedom from headaches, her fasciculations have much resolved, the lifestyle changes she told me about in 2017 have really helped her. She does have some neck arthritis and she has noticed that sometimes pain seems to radiate from the neck towards  the head.  Headaches may be announced by an AURA, nausea, resolve with motrin, can be associated with phonophobia.  We wondered if we should obtain an MRI brain and cervical spine at the same time, I would be very much in favor of this, and [name] will still think about. In addition she reports that her husband plans to retire next year. She has noted that she sleeps best when alone. Her husband is a Insurance underwriter and is often not at home, she is worried that when they finally lift and sleep together in the same room that would be more interruption of her sleep but also her snoring may interrupt his sleep. For this reason we will undergo a home sleep test to see if she has apnea or is truly just snoring. Again, most of the night she will spend alone and there is no witness to her asleep. She continues to be physically active, with a low body mass index and no medical conditions of concern at this time.   HPI The patient reports that she had 2 spells of near-syncope one in May 2014 and one in June of 2014 . She also has a history of migraines with aura, benign fasciculations which have affected also facial muscles, at times presenting like a tic. The benign fasciculations have been present for over 2 decades and are not related to a minor atrophic lateral sclerosis. Pulse of  the near-syncope spells but the patient reported had a diplopia component and appeared to have been experienced as a vertical. The patient reports that the first spell was likely related to dehydration but is not quite sure about the second. The spells don't last long and if she takes fluid and fluid they quickly resolve and do not return. She was sent by her primary care physician for an MRI of the brain which returned normal. She has been seen by [name]  for the brief dizziness, loss of vision , diplopia. She is diagnosed of with macular degeneration early stage , myopia, and ocular migraines.  Vision was 20/25 in both eyes, ocular  pressure was 18 in the right, and 19 mm in the left eye and [name]  reported that the patient is early cataract formation but can see well.The optic nerves are healthy , but there is a macular drusen in the right eye he recommended ocular vitamins and to continue contact lens use the patient was otherwise found to be in good health ,and he did not impose any restrictions on driving activities of daily living etc. The detailed report is reviewed here today as is her MRI report which was entirely normal.She has meanwhile reduced or eliminated her caffeine intake and noticed a reduction in twitches. Her results of the EMG and NCV study have helped her to relax and fee safe with her condition.  She is snoring now, which doesn't bother her but her spouse. She has noted a feeling of thumping at the right ear and her ENT suggested that this may be a manifestation of her fasciculations. Her parents lives in [place], [place]. Close to her sister, her father is in a rehab facility. Her husband is an Emergency planning/management officer, now with [employer].   09-13-2015 Benign fasciculations resolved !  Patient presents now with an ear pain, left side only. Nothing visible on the left ear.  Her orthodontist noted some TMJ  click.  At the same time she had half sided headaches. These were not neuralgic and  have resolved.  The patient does not recall having any zoster outbreaks, she did have some aphtose stomatitis non-herpetic. Dentist found no herpetic lesions and Hpe [name] tested for zoster- negative.  Since she does have a slight click at the left temporomandibular joint I recommend that she will purchase a baby toothbrush, one of those that are worn on top of the index finger like a thimble, and massages from the inside the area of the TMJ joint, she can use it on the outside as well.  This helps TMJ pain and click to disappear quicker. At this time I'm just happy that her symptoms have resolved and I'm happy to see her for any recurrent issues or new problems in the future.  02-15-2016, [name] has experienced an isolated event of diplopia that she related to the left eye. She also has a recurrence of migrainous headaches but they're all a has changed. She used to experience fortification lines which is  now associated with a very bright light perception. The visual aura the last 20-30 minutes. A lot of times there is no headache to follow. She experienced the diplopia before she saw her ophthalmologist [name], who then ordered a carotid Doppler study which returned with clinically insignificant results. Today, her headache is more like a pressure. She continued allergy shots with [name]. A few years ago she has experienced vertical diplopia, and MRI in 2014  was negative- ordered by Dr [name] .  She may take 3 Motrin in a week.  I explained that migraines can change to this state with reaching the 5th decade of life. Many women that experienced severe nausea and headaches associated with menstrual cycles, has later in life the aura  without headaches to follow . Is benign fasciculations there is truly not changed there is no treatment available nor is it necessary. Is important for the patient to stay hydrated and could not get hypoglycemic as these will lead to presyncopal spells as  well as an aunt who took and frequency rise in fasciculations, an  asked her to restrict caffeine intake.  03-27-2016,  patient reports resolution of headaches after quitting chocolate covered almonds ! No more diplopia , rare fasciculation. All tests were normal.        Review of Systems: Out of a complete 14 system review, the patient complains of only the following symptoms, and all other reviewed systems are negative. Less twitching . Snoring reported again, she is complaining of nasal drip , eye itching . She is treated by Warren Lacy, MD at [facility] allergy. She inquired about a sleep evaluation. I would like for her to use her nasal spray at night to see if she would stop mouth breathing which is the main reason for snoring. She reports diplopia, and related this to Dr [name] who in return evaluated her carotid  artery- negative for significant stenosis.   Social History   Social History  . Marital status: Married    Spouse name: [name]  . Number of children: 0  . Years of education: 14   Occupational History  . 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 [employer]   . [employer]ICE [employer]   Social History Main Topics  . Smoking status: Never Smoker  . Smokeless tobacco: Never Used  . Alcohol use No  . Drug use: No  . Sexual activity: Not on file   Other Topics Concern  . Not on file   Social History Narrative   [employer]  in [place]. Married '90 - marriage is in very good shape '12, No children.  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 Offerred medical services for them if needed (Nov '11)   Patient is married [name]) and lives at home with her husband.   Patient is working Administrator, arts work.   Patient has a high school education.   Patient is right-handed.   Patient does not drink any caffeine.          Family History  Problem Relation Age of  Onset  . Hyperlipidemia Father   . COPD Father        Smoker, deceased 2014/06/20  . Hypertension Father   . Hyperlipidemia Mother   . Hypertension Mother   . Diabetes Neg Hx   . Colon cancer Neg Hx   . Breast cancer Neg Hx   . Coronary artery disease Neg Hx   . Cancer Neg Hx        breast or colon    Past Medical History:  Diagnosis Date  . Allergic rhinitis, cause unspecified   . Backache, unspecified   . Benign fasciculation-cramp syndrome ([place]) 12/03/2012    Worsening with  stress, fatigue .  [name] Calculus of kidney   . Conversion disorder   . Cotton wool spots   . Esophageal reflux   . Floater, vitreous, left 20-Jun-2016  . GERD (gastroesophageal reflux disease)   . Hemorrhage of rectum and anus   . Internal hemorrhoids without mention of complication   . Irritable bowel syndrome   . Microscopic hematuria   . Migraine headache with aura   . Mild aortic insufficiency   . Mild tricuspid regurgitation   . Osteopenia   . Other plastic surgery for unacceptable cosmetic appearance    Inj tx fllers/expander  . Personal history of urinary calculi   . Postmenopausal atrophic vaginitis   . Premature atrial contractions   . PVC (premature ventricular contraction)    a. Event monitor 2013.  [name] Unspecified constipation   . Vasovagal syncope     Past Surgical History:  Procedure Laterality Date  . BREAST BIOPSY Right   . Cosmetic Procedures w/Injection therapy    . SKIN CANCER EXCISION Left    thigh  . WISDOM TOOTH EXTRACTION      Current Outpatient Prescriptions  Medication Sig Dispense Refill  . ALPRAZolam (XANAX) 0.25 MG tablet 1 po bid prn anxiety 30 tablet 1  . ALREX 0.2 % SUSP Apply 0.2 drops to eye as needed (allergies).     . Botulinum Toxin Type A, Cosm, (BOTOX COSMETIC) 50 UNITS SOLR Inject 4-6 Units into the muscle. 3 month  injection    . Cyanocobalamin (VITAMIN B-12 PO) Take 1 tablet by mouth daily.    [name]  Diclofenac Sodium (PENNSAID) 2 % SOLN Place 2 application  onto the skin 2 (two) times daily. 112 g 3  . EPIPEN 2-PAK 0.3 MG/0.3ML SOAJ injection See admin instructions. Reported on 10/08/2015  1  . fexofenadine (ALLEGRA ALLERGY CHILDRENS) 30 MG tablet Take 30 mg by mouth 2 (two) times daily as needed (allergies).    . fluocinonide cream (LIDEX) 5.46 % Apply 1 application topically 2 (two) times daily. 90 g 2  . fluticasone (FLONASE) 50 MCG/ACT nasal spray Place 1 spray into both nostrils as needed for allergies.   5  . Hyaluronic Acid (RESTYLANE IJ) Inject as directed every 6 (six) months.    . meclizine (ANTIVERT) 25 MG tablet Take 1 tablet (25 mg total) by mouth 3 (three) times daily as needed for dizziness. 30 tablet 0  . Multiple Vitamins-Minerals (ICAPS AREDS 2) CAPS Take 1 capsule by mouth daily. Reported on 10/08/2015    . pantoprazole (PROTONIX) 40 MG tablet Take 1 tablet (40 mg total) by mouth daily. 90 tablet 3  . PREMARIN vaginal cream INSERT 1 GRAM VAGINALLY ONCE A WEEK AS DIRECTED 30 g 1  . tretinoin (RETIN-A) 0.05 % cream Apply 1 application topically at bedtime.    . triamcinolone cream (KENALOG) 0.1 % Apply 1 application topically 3 (three) times daily. 30 g 0  . Vitamin D, Ergocalciferol, (DRISDOL) 50000 units CAPS capsule Take 1 capsule (50,000 Units total) by mouth every 7 (seven) days. 12 capsule 0   No current facility-administered medications for this visit.     Allergies as of 12/18/2016 - Review Complete 12/18/2016  Allergen Reaction Noted  . Codeine Other (See Comments)   . Doxycycline Other (See Comments) 05/31/2016  . Epinephrine  08/12/2012  . Neomycin  03/08/2015  . Penicillins Hives and Other (See Comments)   . Sulfonamide derivatives Other (See Comments)   . Zithromax [azithromycin] Rash 08/18/2014    Vitals: BP 111/67   Pulse 65   Ht 5\' 2"  (1.575 m)   Wt 103 lb (46.7 kg)   BMI 18.84 kg/m  Last Weight:  Wt Readings from Last 1 Encounters:  12/18/16 103 lb (46.7 kg)   Last Height:   Ht Readings from Last 1  Encounters:  12/18/16 5\' 2"  (1.575 m)   Vision Screening:  Left eye with correction 25/20.  Right eye with correction 25/20. Monovision, contacts.   Physical exam:  General: The patient is awake, alert and appears not in acute distress. The patient is well groomed. Head: Normocephalic, atraumatic. Neck is supple. Mallampati 1- wide open-  neck circumference 12.5 inches, No retrognathia. TMJ click on the right.  Cardiovascular:  Regular rate and rhythm, without  murmurs or carotid bruit, and without distended neck veins.  Respiratory: Lungs are clear to auscultation. Skin:  Without evidence of edema, or rash Trunk: BMI low normal.  Neurologic exam : The patient is awake and alert, oriented to place and time.   Memory subjective described as intact. There is a normal attention span & concentration ability.  Speech is fluent without  dysarthria, dysphonia or aphasia. Mood and affect are appropriate.  Cranial nerves: Pupils are equal and briskly reactive to light. Extraocular movements  in vertical and horizontal planes intact and without nystagmus.  Hearing to finger rub intact.  Facial sensation intact to fine touch. Facial motor strength is symmetric and tongue and uvula move midline. Motor exam: Normal tone and normal muscle bulk and symmetric normal strength in all extremities. No fasciculations.  Sensory:  Fine touch, pinprick and vibration were normal  in all extremities.  Coordination: Rapid alternating movements in the fingers/hands isnormal.  Finger-to-nose maneuver tested and normal without evidence of ataxia, dysmetria or tremor. Gait and station: Patient walks without assistive device.  Deep tendon reflexes: in the upper and lower extremities are symmetric and intact.   Assessment:  After physical and neurologic examination, review of laboratory studies, imaging, neurophysiology testing and pre-existing records.  Again - headaches are much rarer now- but she has a neck tension  component - neck and brain MRI ordered.  Snoring noted, not clear if apnea is present- HST ordered.  Benign fasciculations resolved ! She stayed off caffeine.  Her orthodontist noted some TMJ click.  Headaches resolved since August after she stopped eating chocolate covered almonds.  Diplopia horizontal, related to stress situations isolated events- , no repeat since.   Yearly follow up next July 2019.    Larey Seat, MD   CC Dr. [name]

## 2016-12-25 ENCOUNTER — Ambulatory Visit (INDEPENDENT_AMBULATORY_CARE_PROVIDER_SITE_OTHER): Payer: BLUE CROSS/BLUE SHIELD | Admitting: Family

## 2016-12-25 ENCOUNTER — Other Ambulatory Visit (INDEPENDENT_AMBULATORY_CARE_PROVIDER_SITE_OTHER): Payer: BLUE CROSS/BLUE SHIELD

## 2016-12-25 ENCOUNTER — Encounter: Payer: Self-pay | Admitting: Internal Medicine

## 2016-12-25 ENCOUNTER — Encounter: Payer: Self-pay | Admitting: Family

## 2016-12-25 VITALS — BP 122/74 | HR 72 | Temp 98.8°F | Resp 16 | Ht 62.0 in | Wt 101.8 lb

## 2016-12-25 DIAGNOSIS — R221 Localized swelling, mass and lump, neck: Secondary | ICD-10-CM

## 2016-12-25 LAB — BASIC METABOLIC PANEL
BUN: 18 mg/dL (ref 6–23)
CO2: 26 mEq/L (ref 19–32)
Calcium: 9.7 mg/dL (ref 8.4–10.5)
Chloride: 104 mEq/L (ref 96–112)
Creatinine, Ser: 0.88 mg/dL (ref 0.40–1.20)
GFR: 68.72 mL/min (ref >60.00)
Glucose, Bld: 107 mg/dL — ABNORMAL HIGH (ref 70–99)
POTASSIUM: 4 meq/L (ref 3.5–5.1)
SODIUM: 139 meq/L (ref 135–145)

## 2016-12-25 LAB — CBC
HCT: 38 % (ref 36.0–46.0)
HEMOGLOBIN: 12.8 g/dL (ref 12.0–15.0)
MCHC: 33.8 g/dL (ref 30.0–36.0)
MCV: 91 fl (ref 78.0–100.0)
Platelets: 201 10*3/uL (ref 150.0–400.0)
RBC: 4.17 Mil/uL (ref 3.87–5.11)
RDW: 12.9 % (ref 11.5–15.5)
WBC: 8 10*3/uL (ref 4.0–10.5)

## 2016-12-25 NOTE — Progress Notes (Signed)
Subjective:    Patient ID: [name], female    DOB: 1950/06/19, 66 y.o.   MRN: [id number]  Chief Complaint  Patient presents with  . Neck lump    has a nodule that she noticed on the outside of her neck on the left side, also went to dentist and they saw a nodule in her mouth in the same area as the place she is feeling on the outside noticed it about a week ago    HPI:  Juley Giovanetti is a 66 y.o. female who  has a past medical history of Allergic rhinitis, cause unspecified; Backache, unspecified; Benign fasciculation-cramp syndrome ([place]) (12/03/2012); Calculus of kidney; Conversion disorder; Cotton wool spots; Esophageal reflux; Floater, vitreous, left (05/2016); GERD (gastroesophageal reflux disease); Hemorrhage of rectum and anus; Internal hemorrhoids without mention of complication; Irritable bowel syndrome; Microscopic hematuria; Migraine headache with aura; Mild aortic insufficiency; Mild tricuspid regurgitation; Osteopenia; Other plastic surgery for unacceptable cosmetic appearance; Personal history of urinary calculi; Postmenopausal atrophic vaginitis; Premature atrial contractions; PVC (premature ventricular contraction); Unspecified constipation; and Vasovagal syncope. and presents today for an acute office visit.   This is a new problem. Associated symptom of a nodule located on the left side of her neck approximately one week ago. During a dental appointment she was noted to have a nodule in her mouth in the same area as feeling on the outside of her neck. Has a follow up appointment with the oral surgeon in the next couple of days. No fevers, chills, night sweats or unintential weight loss. Denies any modifying factors or attempted treatments. Has stayed about the same. Course of the lesion appears the same since first noting it. No dysphasia.    Allergies  Allergen Reactions  . Codeine Other (See Comments)    Doesn't remeber  . Doxycycline Other (See Comments)  .  Epinephrine     Heart racing   . Neomycin     Topical rash from cream  . Penicillins Hives and Other (See Comments)    Has patient had a PCN reaction causing immediate rash, facial/tongue/throat swelling, SOB or lightheadedness with hypotension: Yes Has patient had a PCN reaction causing severe rash involving mucus membranes or skin necrosis: Yes Has patient had a PCN reaction that required hospitalization No Has patient had a PCN reaction occurring within the last 10 years: No If all of the above answers are "NO", then may proceed with Cephalosporin use.   . Sulfonamide Derivatives Other (See Comments)    Doesn't remember   . Zithromax [Azithromycin] Rash      Outpatient Medications Prior to Visit  Medication Sig Dispense Refill  . ALPRAZolam (XANAX) 0.25 MG tablet 1 po bid prn anxiety 30 tablet 1  . ALREX 0.2 % SUSP Apply 0.2 drops to eye as needed (allergies).     . Botulinum Toxin Type A, Cosm, (BOTOX COSMETIC) 50 UNITS SOLR Inject 4-6 Units into the muscle. 3 month  injection    . Cyanocobalamin (VITAMIN B-12 PO) Take 1 tablet by mouth daily.    . Diclofenac Sodium (PENNSAID) 2 % SOLN Place 2 application onto the skin 2 (two) times daily. 112 g 3  . EPIPEN 2-PAK 0.3 MG/0.3ML SOAJ injection See admin instructions. Reported on 10/08/2015  1  . fexofenadine (ALLEGRA ALLERGY CHILDRENS) 30 MG tablet Take 30 mg by mouth 2 (two) times daily as needed (allergies).    . fluocinonide cream (LIDEX) 0.48 % Apply 1 application topically 2 (two) times daily. 90 g  2  . fluticasone (FLONASE) 50 MCG/ACT nasal spray Place 1 spray into both nostrils as needed for allergies.   5  . Hyaluronic Acid (RESTYLANE IJ) Inject as directed every 6 (six) months.    . meclizine (ANTIVERT) 25 MG tablet Take 1 tablet (25 mg total) by mouth 3 (three) times daily as needed for dizziness. 30 tablet 0  . Multiple Vitamins-Minerals (ICAPS AREDS 2) CAPS Take 1 capsule by mouth daily. Reported on 10/08/2015    .  pantoprazole (PROTONIX) 40 MG tablet Take 1 tablet (40 mg total) by mouth daily. 90 tablet 3  . PREMARIN vaginal cream INSERT 1 GRAM VAGINALLY ONCE A WEEK AS DIRECTED 30 g 1  . tretinoin (RETIN-A) 0.05 % cream Apply 1 application topically at bedtime.    . triamcinolone cream (KENALOG) 0.1 % Apply 1 application topically 3 (three) times daily. 30 g 0  . Vitamin D, Ergocalciferol, (DRISDOL) 50000 units CAPS capsule Take 1 capsule (50,000 Units total) by mouth every 7 (seven) days. 12 capsule 0   No facility-administered medications prior to visit.       Past Surgical History:  Procedure Laterality Date  . BREAST BIOPSY Right   . Cosmetic Procedures w/Injection therapy    . SKIN CANCER EXCISION Left    thigh  . WISDOM TOOTH EXTRACTION        Past Medical History:  Diagnosis Date  . Allergic rhinitis, cause unspecified   . Backache, unspecified   . Benign fasciculation-cramp syndrome ([place]) 12/03/2012    Worsening with  stress, fatigue .  [name] Calculus of kidney   . Conversion disorder   . Cotton wool spots   . Esophageal reflux   . Floater, vitreous, left 05/2016  . GERD (gastroesophageal reflux disease)   . Hemorrhage of rectum and anus   . Internal hemorrhoids without mention of complication   . Irritable bowel syndrome   . Microscopic hematuria   . Migraine headache with aura   . Mild aortic insufficiency   . Mild tricuspid regurgitation   . Osteopenia   . Other plastic surgery for unacceptable cosmetic appearance    Inj tx fllers/expander  . Personal history of urinary calculi   . Postmenopausal atrophic vaginitis   . Premature atrial contractions   . PVC (premature ventricular contraction)    a. Event monitor 2013.  [name] Unspecified constipation   . Vasovagal syncope       Review of Systems  Constitutional: Negative for appetite change, chills, fatigue, fever and unexpected weight change.  HENT:       Positive for mass of neck  Respiratory: Negative for chest  tightness and shortness of breath.       Objective:    BP 122/74 (BP Location: Left Arm, Patient Position: Sitting, Cuff Size: Normal)   Pulse 72   Temp 98.8 F (37.1 C) (Oral)   Resp 16   Ht 5\' 2"  (1.575 m)   Wt 101 lb 12.8 oz (46.2 kg)   SpO2 98%   BMI 18.62 kg/m  Nursing note and vital signs reviewed.  Physical Exam  Constitutional: She is oriented to person, place, and time. She appears well-developed and well-nourished. No distress.  HENT:  Head:    Cardiovascular: Normal rate, regular rhythm, normal heart sounds and intact distal pulses.   Pulmonary/Chest: Effort normal and breath sounds normal.  Neurological: She is alert and oriented to person, place, and time.  Skin: Skin is warm and dry.  Psychiatric: She has a normal  mood and affect. Her behavior is normal. Judgment and thought content normal.       Assessment & Plan:   Problem List Items Addressed This Visit      Other   Mass of neck - Primary    Mass of neck/mouth of undetermined origin with concern for cyst, infection, or possible malignancy. Has appointment with oral surgery tomorrow. Obtain basic metabolic profile and CBC. Consider follow-up ENT and possible imaging pending oral surgery appointment.       Relevant Orders   Basic Metabolic Panel (BMET)   CBC       I am having Ms. Delone maintain her Botulinum Toxin Type A (Cosm), ALREX, Hyaluronic Acid (RESTYLANE IJ), fluticasone, tretinoin, fluocinonide cream, EPIPEN 2-PAK, Cyanocobalamin (VITAMIN B-12 PO), ICAPS AREDS 2, fexofenadine, meclizine, triamcinolone cream, Vitamin D (Ergocalciferol), Diclofenac Sodium, pantoprazole, PREMARIN, and ALPRAZolam.   Follow-up: Return if symptoms worsen or fail to improve.  Mauricio Po, FNP

## 2016-12-25 NOTE — Assessment & Plan Note (Signed)
Mass of neck/mouth of undetermined origin with concern for cyst, infection, or possible malignancy. Has appointment with oral surgery tomorrow. Obtain basic metabolic profile and CBC. Consider follow-up ENT and possible imaging pending oral surgery appointment.

## 2016-12-25 NOTE — Patient Instructions (Signed)
Thank you for choosing [facility].  SUMMARY AND INSTRUCTIONS:  Please follow up with oral surgery.  Next recommendation would be to follow up with ENT.   We will await imaging until the results tomorrow.   Follow up:  If your symptoms worsen or fail to improve, please contact our office for further instruction, or in case of emergency go directly to the emergency room at the closest medical facility.

## 2016-12-26 ENCOUNTER — Encounter: Payer: Self-pay | Admitting: Internal Medicine

## 2017-01-17 DIAGNOSIS — R221 Localized swelling, mass and lump, neck: Secondary | ICD-10-CM | POA: Insufficient documentation

## 2017-01-21 ENCOUNTER — Ambulatory Visit (INDEPENDENT_AMBULATORY_CARE_PROVIDER_SITE_OTHER): Payer: BLUE CROSS/BLUE SHIELD | Admitting: Neurology

## 2017-01-21 DIAGNOSIS — R0683 Snoring: Secondary | ICD-10-CM

## 2017-01-21 DIAGNOSIS — G4733 Obstructive sleep apnea (adult) (pediatric): Secondary | ICD-10-CM

## 2017-01-28 ENCOUNTER — Telehealth: Payer: Self-pay | Admitting: Neurology

## 2017-01-28 NOTE — Telephone Encounter (Signed)
Pt called for home sleep study results. Pt was advised Dr Brett Fairy was out of the office on Thursday and Friday.

## 2017-01-28 NOTE — Telephone Encounter (Signed)
Will notify patient as soon as Dr Brett Fairy has results ready.

## 2017-01-29 ENCOUNTER — Ambulatory Visit: Payer: Self-pay | Admitting: Internal Medicine

## 2017-01-31 ENCOUNTER — Ambulatory Visit (INDEPENDENT_AMBULATORY_CARE_PROVIDER_SITE_OTHER): Payer: BLUE CROSS/BLUE SHIELD | Admitting: Internal Medicine

## 2017-01-31 ENCOUNTER — Telehealth: Payer: Self-pay | Admitting: Neurology

## 2017-01-31 ENCOUNTER — Encounter: Payer: Self-pay | Admitting: Internal Medicine

## 2017-01-31 ENCOUNTER — Other Ambulatory Visit (INDEPENDENT_AMBULATORY_CARE_PROVIDER_SITE_OTHER): Payer: BLUE CROSS/BLUE SHIELD

## 2017-01-31 VITALS — BP 120/78 | HR 68 | Temp 98.5°F | Ht 62.0 in | Wt 101.0 lb

## 2017-01-31 DIAGNOSIS — R229 Localized swelling, mass and lump, unspecified: Secondary | ICD-10-CM

## 2017-01-31 DIAGNOSIS — L989 Disorder of the skin and subcutaneous tissue, unspecified: Secondary | ICD-10-CM | POA: Diagnosis not present

## 2017-01-31 DIAGNOSIS — E785 Hyperlipidemia, unspecified: Secondary | ICD-10-CM

## 2017-01-31 DIAGNOSIS — I359 Nonrheumatic aortic valve disorder, unspecified: Secondary | ICD-10-CM

## 2017-01-31 LAB — LIPID PANEL
CHOL/HDL RATIO: 3
CHOLESTEROL: 219 mg/dL — AB (ref 0–200)
HDL: 74.6 mg/dL (ref >39.00)
LDL CALC: 128 mg/dL — AB (ref 0–99)
NonHDL: 143.94
TRIGLYCERIDES: 78 mg/dL (ref 0.0–149.0)
VLDL: 15.6 mg/dL (ref 0.0–40.0)

## 2017-01-31 LAB — BASIC METABOLIC PANEL
BUN: 19 mg/dL (ref 6–23)
CHLORIDE: 105 meq/L (ref 96–112)
CO2: 28 meq/L (ref 19–32)
CREATININE: 0.83 mg/dL (ref 0.40–1.20)
Calcium: 9.9 mg/dL (ref 8.4–10.5)
GFR: 73.5 mL/min (ref >60.00)
Glucose, Bld: 92 mg/dL (ref 70–99)
POTASSIUM: 4.8 meq/L (ref 3.5–5.1)
Sodium: 139 mEq/L (ref 135–145)

## 2017-01-31 NOTE — Procedures (Signed)
[place], [place] [address] [name]           DOB: 1951/06/17 MEDICAL RECORD [id number]    DOS: 01/21/17 REFERRING PHYSICIAN: Baltazar Apo, MD  STUDY PERFORMED: HST HISTORY: [name] is a 66 y.o. female patient of Dr. Judeen Hammans with a condition of  benign fasciculation and snoring. She noted that she sleeps best when alone. Her husband is a Insurance underwriter and is often not at home, she is worried that when they finally live and sleep together in the same room her sleep but also her snoring may interrupt his sleep. She continues to be physically active, with a low body mass index and no medical conditions of concern at this time. STUDY RESULTS: Total Recording Time: 6h 14m. Total Apnea/Hypopnea Index (AHI): 2.1/hr. RDI was 4.6/hr.  Average Oxygen Saturation: 95%, Lowest O2 Saturation: 88%, and 0.5 minutes at or below 88% SpO2. Average Heart Rate: 59 bpm, between 51 and 95 bpm. NSR.  IMPRESSION: no indication of sleep apnea, probably mild snoring.  RECOMMENDATION: No indication for intervention.  I certify that I have reviewed the raw data recording prior to the issuance of this report in accordance with the standards of the American Academy of Sleep Medicine (AASM). Larey Seat, MD   01-31-2017  [facility], Medical Director [place], ABPN and ABSM

## 2017-01-31 NOTE — Progress Notes (Signed)
Subjective:  Patient ID: [name], female    DOB: [birth year]  Age: 66 y.o. MRN: [id number]  CC: No chief complaint on file.   HPI [name] presents for L prox lat shin small NT swelling F/u ECHO/AS  Outpatient Medications Prior to Visit  Medication Sig Dispense Refill  . ALPRAZolam (XANAX) 0.25 MG tablet 1 po bid prn anxiety 30 tablet 1  . ALREX 0.2 % SUSP Apply 0.2 drops to eye as needed (allergies).     . Botulinum Toxin Type A, Cosm, (BOTOX COSMETIC) 50 UNITS SOLR Inject 4-6 Units into the muscle. 3 month  injection    . Cyanocobalamin (VITAMIN B-12 PO) Take 1 tablet by mouth daily.    . Diclofenac Sodium (PENNSAID) 2 % SOLN Place 2 application onto the skin 2 (two) times daily. 112 g 3  . EPIPEN 2-PAK 0.3 MG/0.3ML SOAJ injection See admin instructions. Reported on 10/08/2015  1  . fexofenadine (ALLEGRA ALLERGY CHILDRENS) 30 MG tablet Take 30 mg by mouth 2 (two) times daily as needed (allergies).    . fluocinonide cream (LIDEX) 3.82 % Apply 1 application topically 2 (two) times daily. 90 g 2  . fluticasone (FLONASE) 50 MCG/ACT nasal spray Place 1 spray into both nostrils as needed for allergies.   5  . Hyaluronic Acid (RESTYLANE IJ) Inject as directed every 6 (six) months.    . meclizine (ANTIVERT) 25 MG tablet Take 1 tablet (25 mg total) by mouth 3 (three) times daily as needed for dizziness. 30 tablet 0  . Multiple Vitamins-Minerals (ICAPS AREDS 2) CAPS Take 1 capsule by mouth daily. Reported on 10/08/2015    . pantoprazole (PROTONIX) 40 MG tablet Take 1 tablet (40 mg total) by mouth daily. 90 tablet 3  . PREMARIN vaginal cream INSERT 1 GRAM VAGINALLY ONCE A WEEK AS DIRECTED 30 g 1  . tretinoin (RETIN-A) 0.05 % cream Apply 1 application topically at bedtime.    . triamcinolone cream (KENALOG) 0.1 % Apply 1 application topically 3 (three) times daily. 30 g 0  . Vitamin D, Ergocalciferol, (DRISDOL) 50000 units CAPS capsule Take 1 capsule (50,000 Units total) by mouth  every 7 (seven) days. 12 capsule 0   No facility-administered medications prior to visit.     ROS Review of Systems  Constitutional: Negative for activity change, appetite change, chills, fatigue and unexpected weight change.  HENT: Negative for congestion, mouth sores and sinus pressure.   Eyes: Negative for visual disturbance.  Respiratory: Negative for cough and chest tightness.   Gastrointestinal: Negative for abdominal pain and nausea.  Genitourinary: Negative for difficulty urinating, frequency and vaginal pain.  Musculoskeletal: Negative for back pain and gait problem.  Skin: Negative for pallor and rash.  Neurological: Negative for dizziness, tremors, weakness, numbness and headaches.  Psychiatric/Behavioral: Negative for confusion and sleep disturbance. The patient is not nervous/anxious.     Objective:  BP 120/78 (BP Location: Left Arm, Patient Position: Sitting, Cuff Size: Normal)   Pulse 68   Temp 98.5 F (36.9 C) (Oral)   Ht 5\' 2"  (1.575 m)   Wt 101 lb (45.8 kg)   SpO2 98%   BMI 18.47 kg/m   BP Readings from Last 3 Encounters:  01/31/17 120/78  12/25/16 122/74  12/18/16 111/67    Wt Readings from Last 3 Encounters:  01/31/17 101 lb (45.8 kg)  12/25/16 101 lb 12.8 oz (46.2 kg)  12/18/16 103 lb (46.7 kg)    Physical Exam  Constitutional: She appears well-developed. No  distress.  HENT:  Head: Normocephalic.  Right Ear: External ear normal.  Left Ear: External ear normal.  Nose: Nose normal.  Mouth/Throat: Oropharynx is clear and moist.  Eyes: Pupils are equal, round, and reactive to light. Conjunctivae are normal. Right eye exhibits no discharge. Left eye exhibits no discharge.  Neck: Normal [name] of motion. Neck supple. No JVD present. No tracheal deviation present. No thyromegaly present.  Cardiovascular: Normal rate, regular rhythm and normal heart sounds.   Pulmonary/Chest: No stridor. No respiratory distress. She has no wheezes.  Abdominal: Soft.  Bowel sounds are normal. She exhibits no distension and no mass. There is no tenderness. There is no rebound and no guarding.  Musculoskeletal: She exhibits no edema or tenderness.  Lymphadenopathy:    She has no cervical adenopathy.  Neurological: She displays normal reflexes. No cranial nerve deficit. She exhibits normal muscle tone. Coordination normal.  Skin: No rash noted. No erythema.  Psychiatric: She has a normal mood and affect. Her behavior is normal. Judgment and thought content normal.     L lat prox <1 cm prominent vein fully collapsible w/palpation Procedure Note :    Procedure :   Point of care (POC) sonography examination   Indication: L lat prox <1 cm swelling   Equipment used: Lumify linear probe. The images were stored in the unit and later transferred in storage.  The patient was placed in a decubitus position.  This study revealed a prominent superficial vein fully collapsible on US   Impression: a prominent superficial vein fully collapsible on [place]       Lab Results  Component Value Date   WBC 8.0 12/25/2016   HGB 12.8 12/25/2016   HCT 38.0 12/25/2016   PLT 201.0 12/25/2016   GLUCOSE 107 (H) 12/25/2016   CHOL 225 (H) 09/12/2016   TRIG 85.0 09/12/2016   HDL 71.80 09/12/2016   LDLCALC 136 (H) 09/12/2016   ALT 11 09/12/2016   AST 14 09/12/2016   NA 139 12/25/2016   K 4.0 12/25/2016   CL 104 12/25/2016   CREATININE 0.88 12/25/2016   BUN 18 12/25/2016   CO2 26 12/25/2016   TSH 1.52 09/12/2016    Dg Tibia/fibula Left  Result Date: 08/14/2016 CLINICAL DATA:  Anterior and medial tenderness over the mid tibial region for the past week without known injury. EXAM: LEFT TIBIA AND FIBULA - 2 VIEW COMPARISON:  None in PACs FINDINGS: The left tibia and fibula are adequately mineralized. There is no lytic or blastic lesion. There is no periosteal reaction. The overlying soft tissues are normal. There is an rounded bony density adjacent to the tip of the  lateral malleolus which may reflect an accessory ossicle or old avulsion fracture fragment. The observed portions of the knee exhibit no acute abnormalities. IMPRESSION: No acute or significant chronic bony abnormality in the area of clinical concern. These soft tissues are unremarkable as well. Electronically Signed   By: David  Martinique M.D.   On: 08/14/2016 15:40    Assessment & Plan:   There are no diagnoses linked to this encounter. I am having Ms. Gramajo maintain her Botulinum Toxin Type A (Cosm), ALREX, Hyaluronic Acid (RESTYLANE IJ), fluticasone, tretinoin, fluocinonide cream, EPIPEN 2-PAK, Cyanocobalamin (VITAMIN B-12 PO), ICAPS AREDS 2, fexofenadine, meclizine, triamcinolone cream, Vitamin D (Ergocalciferol), Diclofenac Sodium, pantoprazole, PREMARIN, and ALPRAZolam.  No orders of the defined types were placed in this encounter.    Follow-up: No Follow-up on file.  [name], MD

## 2017-01-31 NOTE — Patient Instructions (Signed)
Notes Recorded by Burnell Blanks, MD on 08/20/2016 at 1:48 PM EST Echo shows normal LV systolic function. Her aortic valve has been leaky and this is perhaps slightly worse but still not in a range that we have to worry about. I would recommend repeat echo in one year. Gerald Stabs

## 2017-01-31 NOTE — Assessment & Plan Note (Signed)
LLE: L lat prox <1 cm prominent vein on [place]

## 2017-01-31 NOTE — Telephone Encounter (Signed)
Pt stopped by the lobby asking for results of her home sleep study. Her best call back is [phone]

## 2017-02-03 NOTE — Assessment & Plan Note (Signed)
Repeat ECHO in 1 year

## 2017-02-04 ENCOUNTER — Telehealth: Payer: Self-pay

## 2017-02-04 ENCOUNTER — Telehealth: Payer: Self-pay | Admitting: Neurology

## 2017-02-04 NOTE — Telephone Encounter (Signed)
Left message for patient to let her know that the HST was a good recording and results are accurate. Call back with any further questions.

## 2017-02-04 NOTE — Telephone Encounter (Signed)
Called pt and went over sleep study results. Informed the patient that Dr Brett Fairy did not see any indications that lead her to believe she had sleep apnea. Only mild snoring noted. Pt verbalized understanding and had no further questions.

## 2017-02-04 NOTE — Telephone Encounter (Signed)
-----   Message from Larey Seat, MD sent at 01/31/2017  5:49 PM EDT ----- There is no apnea requiring treatment, but if the patient feels the need to treat her mild snoring, she may consider use a dental device.

## 2017-02-06 NOTE — Telephone Encounter (Signed)
Spoke with patient and explained that her home study was accurate.She was concerned with the apnea box being on her lower sternum it would be a different reading.I explained that  I reviewed raw data and her flow channel worked great throughout night. It was a good reading. She felt better and was appreciative of my call.

## 2017-02-11 ENCOUNTER — Ambulatory Visit: Payer: BLUE CROSS/BLUE SHIELD | Admitting: Podiatry

## 2017-02-11 ENCOUNTER — Telehealth: Payer: Self-pay | Admitting: Podiatry

## 2017-02-11 ENCOUNTER — Encounter: Payer: Self-pay | Admitting: Podiatry

## 2017-02-11 ENCOUNTER — Ambulatory Visit (INDEPENDENT_AMBULATORY_CARE_PROVIDER_SITE_OTHER): Payer: BLUE CROSS/BLUE SHIELD | Admitting: Podiatry

## 2017-02-11 DIAGNOSIS — L600 Ingrowing nail: Secondary | ICD-10-CM | POA: Diagnosis not present

## 2017-02-11 NOTE — Telephone Encounter (Signed)
Returned patient's call at [phone] (Cell #) to answer a question she had about her ingrown toenail procedure that was performed on Monday, February 11, 2017 By Dr. Paulla Dolly.  Patient took off their sock and touched their toe on the grill of their car. Patient was concerned if bacteria got into their bandage. I asked the patient if they injured their toe or punctured the wrapping on their toe. Pt stated, "No, I did not injure my toe or puncture it". I told her I thought her toe would be fine and not to worry. I also told patient to call us if they have any further concerns. Pt stated she understood.

## 2017-02-11 NOTE — Patient Instructions (Signed)

## 2017-02-11 NOTE — Progress Notes (Signed)
Subjective:    Patient ID: [name], female   DOB: 66 y.o.   MRN: [id number]   HPI patient presents stating her left big toenail is sore in the corner and they have tried to use trimming at the pedicure but they're not able to get it out    ROS      Objective:  Physical Exam neurovascular status intact with patient found to have incurvated lateral border left hallux that's painful when pressed     Assessment:   Chronic ingrown toenail deformity left hallux lateral border      Plan:   Reviewed condition and recommended removal of the border and explained procedure and risk. Patient wants this done understanding the risk of surgery and today I infiltrated 60 mg Xylocaine Marcaine mixture remove the lateral border exposed matrix and applied phenol 3 applications 30 seconds followed by alcohol lavaged sterile dressing. Gave instructions on soaks and reappoint

## 2017-02-12 ENCOUNTER — Telehealth: Payer: Self-pay | Admitting: *Deleted

## 2017-02-12 NOTE — Telephone Encounter (Addendum)
Pt states she had a toenail procedure yesterday and it was pretty painful last night, but used ibuprofen, and this morning had blood on the dressing. I told pt she would have oozing, weeping and stinging with redness and swelling to varying degrees over the next 4-6 weeks and the soaking and the antibiotic ointment would help. Pt states she can't use neosporin so I recommended bacitracin. Pt asked if she had to keep the toe covered during the shower and I told her no to perform her soaks after the shower.02/21/2017-Pt presented to office, to have a nurse check right medial 1st toenail border. Pt's right 1st toe medial border had soft appearing scab without redness or drainage. I told pt to continue the daily soaking as recommended by Dr. Paulla Dolly and antibiotic ointment, and she could allow to air dry in the evening, and while resting, but to keep covered in shoes and walking around. I gave pt 12 fabric bandaids. Pt states understanding.

## 2017-02-14 ENCOUNTER — Ambulatory Visit: Payer: BLUE CROSS/BLUE SHIELD | Admitting: Podiatry

## 2017-02-22 ENCOUNTER — Encounter: Payer: Self-pay | Admitting: Cardiovascular Disease

## 2017-02-22 ENCOUNTER — Telehealth: Payer: Self-pay | Admitting: Cardiovascular Disease

## 2017-02-22 NOTE — Telephone Encounter (Signed)
New message    Patient c/o Palpitations:  High priority if patient c/o lightheadedness and shortness of breath.  1. How long have you been having palpitations? Since Thursday   2. Are you currently experiencing lightheadedness and shortness of breath? no  3. Have you checked your BP and heart rate? (document readings) no  4. Are you experiencing any other symptoms? Heart rate was up to 189 resting , after it happened  She had a hard time to catch her breath, she set down and took deep breaths til it subsided

## 2017-02-22 NOTE — Telephone Encounter (Signed)
Spoke with the patient.  She states she had an episode of a fast heart rate on 02/20/17 in the evening that was "quick." She had another episode last night while speaking with her mother on the phone.  She states this lasted a few minutes. She noticed on her IPhone watch that her HR went up to 189 bpm. She states she was walking around at the time and then went to sit down. She reports feeling ok during this episode, but then it was a little harder to take a deep breath after wards.  The only change for the patient is an increase in migraine headaches. She did eat some dark chocolate pecans yesterday. She also reports that she has been working out with a trainer for years, but she has noticed lately that she is having a harder time taking a deep breath.  Otherwise, HR's have been the mid 70's and SBP's have been running 106-120.  I advised the patient with the 2 brief episodes of her elevated HR, that there may not be anything for [place] to do at this time. I will forward to Dr. Angelena Form as an Juluis Rainier and for any further recommendations. She is aware that if symptoms reoccur and persist, she should call the office back as she may need to go to the ER/ come to the office for an EKG to confirm her rhythm.  Otherwise, if episodes remain brief, but become more frequent she may benefit from an event monitor.

## 2017-02-22 NOTE — Telephone Encounter (Signed)
I spoke with the patient- she is aware of Dr. Camillia Herter recommendations and is agreeable. She is grateful for the call back.

## 2017-02-22 NOTE — Telephone Encounter (Signed)
I agree. This was likely SVT. I would reassure her. Agree with you. Avoid stimulants. If recurrence, call back and we can arrange event monitor. Thanks, chris

## 2017-02-28 ENCOUNTER — Encounter: Payer: Self-pay | Admitting: Podiatry

## 2017-02-28 ENCOUNTER — Ambulatory Visit (INDEPENDENT_AMBULATORY_CARE_PROVIDER_SITE_OTHER): Payer: BLUE CROSS/BLUE SHIELD | Admitting: Podiatry

## 2017-02-28 DIAGNOSIS — L03031 Cellulitis of right toe: Secondary | ICD-10-CM

## 2017-02-28 DIAGNOSIS — L600 Ingrowing nail: Secondary | ICD-10-CM

## 2017-02-28 NOTE — Patient Instructions (Signed)

## 2017-02-28 NOTE — Progress Notes (Signed)
Subjective:    Patient ID: [name], female   DOB: 66 y.o.   MRN: [id number]   HPI patient states the nail is slightly red and she is worried about infection big toenail right foot    ROS      Objective:  Physical Exam neurovascular status intact with slight redness on the medial border right hallux with crusted tissue localized with no proximal edema erythema drainage     Assessment:   May be a very mild local paronychia infection right      Plan:    Advised on trimming techniques and that this should heal uneventfully but it increased redness were to occur or drainage patient will reappoint immediately

## 2017-03-06 ENCOUNTER — Telehealth: Payer: Self-pay | Admitting: *Deleted

## 2017-03-06 NOTE — Telephone Encounter (Signed)
Pt states she noticed today at work her toe was bleeding at the toenail area. I told pt to begin soaking in epsom salt again and allow to air dry at night and cover with an antibiotic oinment bandaid while in shoes for the next week.

## 2017-03-12 ENCOUNTER — Other Ambulatory Visit: Payer: Self-pay | Admitting: Internal Medicine

## 2017-03-12 DIAGNOSIS — I6523 Occlusion and stenosis of bilateral carotid arteries: Secondary | ICD-10-CM

## 2017-03-13 ENCOUNTER — Ambulatory Visit: Payer: BLUE CROSS/BLUE SHIELD | Admitting: Internal Medicine

## 2017-03-14 LAB — HM MAMMOGRAPHY

## 2017-03-15 ENCOUNTER — Encounter: Payer: Self-pay | Admitting: Internal Medicine

## 2017-03-21 ENCOUNTER — Ambulatory Visit (INDEPENDENT_AMBULATORY_CARE_PROVIDER_SITE_OTHER)
Admission: RE | Admit: 2017-03-21 | Discharge: 2017-03-21 | Disposition: A | Discharge status: Home / Self Care | Payer: BLUE CROSS/BLUE SHIELD | Source: Ambulatory Visit | Attending: Internal Medicine | Admitting: Internal Medicine

## 2017-03-21 ENCOUNTER — Encounter: Payer: Self-pay | Admitting: Internal Medicine

## 2017-03-21 ENCOUNTER — Ambulatory Visit (INDEPENDENT_AMBULATORY_CARE_PROVIDER_SITE_OTHER): Payer: BLUE CROSS/BLUE SHIELD | Admitting: Internal Medicine

## 2017-03-21 VITALS — BP 118/72 | HR 66 | Temp 98.5°F | Ht 62.0 in | Wt 104.0 lb

## 2017-03-21 DIAGNOSIS — R0789 Other chest pain: Secondary | ICD-10-CM | POA: Diagnosis not present

## 2017-03-21 DIAGNOSIS — R002 Palpitations: Secondary | ICD-10-CM

## 2017-03-21 DIAGNOSIS — I359 Nonrheumatic aortic valve disorder, unspecified: Secondary | ICD-10-CM | POA: Diagnosis not present

## 2017-03-21 NOTE — Assessment & Plan Note (Signed)
stable °

## 2017-03-21 NOTE — Assessment & Plan Note (Addendum)
F/u w/Dr Angelena Form EKG nl CXR

## 2017-03-21 NOTE — Assessment & Plan Note (Signed)
L sided ?etiology CXR EKG nl

## 2017-03-21 NOTE — Progress Notes (Signed)
Subjective:  Patient ID: [name] [name], female    DOB: 20-Jun-1950  Age: 66 y.o. MRN: [id number]  CC: No chief complaint on file.   HPI [name] [name] presents for a rapid HR of 189 x2 min on 02/20/17. The pt contacted cardiology office C/o L chest "vibration" off and on starting on 9/22. Mammo was nl last week   Outpatient Medications Prior to Visit  Medication Sig Dispense Refill  . ALPRAZolam (XANAX) 0.25 MG tablet 1 po bid prn anxiety 30 tablet 1  . ALREX 0.2 % SUSP Apply 0.2 drops to eye as needed (allergies).     . Botulinum Toxin Type A, Cosm, (BOTOX COSMETIC) 50 UNITS SOLR Inject 4-6 Units into the muscle. 3 month  injection    . Cyanocobalamin (VITAMIN B-12 PO) Take 1 tablet by mouth daily.    . Diclofenac Sodium (PENNSAID) 2 % SOLN Place 2 application onto the skin 2 (two) times daily. 112 g 3  . EPIPEN 2-PAK 0.3 MG/0.3ML SOAJ injection See admin instructions. Reported on 10/08/2015  1  . fexofenadine (ALLEGRA ALLERGY CHILDRENS) 30 MG tablet Take 30 mg by mouth 2 (two) times daily as needed (allergies).    . fluocinonide cream (LIDEX) 5.09 % Apply 1 application topically 2 (two) times daily. 90 g 2  . fluticasone (FLONASE) 50 MCG/ACT nasal spray Place 1 spray into both nostrils as needed for allergies.   5  . Hyaluronic Acid (RESTYLANE IJ) Inject as directed every 6 (six) months.    . meclizine (ANTIVERT) 25 MG tablet Take 1 tablet (25 mg total) by mouth 3 (three) times daily as needed for dizziness. 30 tablet 0  . Multiple Vitamins-Minerals (ICAPS AREDS 2) CAPS Take 1 capsule by mouth daily. Reported on 10/08/2015    . pantoprazole (PROTONIX) 40 MG tablet Take 1 tablet (40 mg total) by mouth daily. 90 tablet 3  . PREMARIN vaginal cream INSERT 1 GRAM VAGINALLY ONCE A WEEK AS DIRECTED 30 g 1  . tretinoin (RETIN-A) 0.05 % cream Apply 1 application topically at bedtime.    . triamcinolone cream (KENALOG) 0.1 % Apply 1 application topically 3 (three) times daily. 30 g 0  .  Vitamin D, Ergocalciferol, (DRISDOL) 50000 units CAPS capsule Take 1 capsule (50,000 Units total) by mouth every 7 (seven) days. 12 capsule 0   No facility-administered medications prior to visit.     ROS Review of Systems  Constitutional: Negative for activity change, appetite change, chills, fatigue and unexpected weight change.  HENT: Negative for congestion, mouth sores and sinus pressure.   Eyes: Negative for visual disturbance.  Respiratory: Negative for cough and chest tightness.   Cardiovascular: Positive for palpitations.  Gastrointestinal: Negative for abdominal pain and nausea.  Genitourinary: Negative for difficulty urinating, frequency and vaginal pain.  Musculoskeletal: Negative for back pain and gait problem.  Skin: Negative for pallor and rash.  Neurological: Negative for dizziness, tremors, weakness, numbness and headaches.  Psychiatric/Behavioral: Negative for confusion and sleep disturbance.    Objective:  BP 118/72 (BP Location: Left Arm, Patient Position: Sitting, Cuff Size: Large)   Pulse 66   Temp 98.5 F (36.9 C) (Oral)   Ht 5\' 2"  (1.575 m)   Wt 104 lb (47.2 kg)   SpO2 98%   BMI 19.02 kg/m   BP Readings from Last 3 Encounters:  03/21/17 118/72  01/31/17 120/78  12/25/16 122/74    Wt Readings from Last 3 Encounters:  03/21/17 104 lb (47.2 kg)  01/31/17 101 lb (45.8  kg)  12/25/16 101 lb 12.8 oz (46.2 kg)    Physical Exam  Constitutional: She appears well-developed. No distress.  HENT:  Head: Normocephalic.  Right Ear: External ear normal.  Left Ear: External ear normal.  Nose: Nose normal.  Mouth/Throat: Oropharynx is clear and moist.  Eyes: Pupils are equal, round, and reactive to light. Conjunctivae are normal. Right eye exhibits no discharge. Left eye exhibits no discharge.  Neck: Normal [name] of motion. Neck supple. No JVD present. No tracheal deviation present. No thyromegaly present.  Cardiovascular: Normal rate, regular rhythm and  normal heart sounds.   Pulmonary/Chest: No stridor. No respiratory distress. She has no wheezes.  Abdominal: Soft. Bowel sounds are normal. She exhibits no distension and no mass. There is no tenderness. There is no rebound and no guarding.  Musculoskeletal: She exhibits no edema or tenderness.  Lymphadenopathy:    She has no cervical adenopathy.  Neurological: She displays normal reflexes. No cranial nerve deficit. She exhibits normal muscle tone. Coordination normal.  Skin: No rash noted. No erythema.  Psychiatric: She has a normal mood and affect. Her behavior is normal. Judgment and thought content normal.   Procedure: EKG Indication: chest discomfort, palpitations Impression: NSR. No acute changes.  Lab Results  Component Value Date   WBC 8.0 12/25/2016   HGB 12.8 12/25/2016   HCT 38.0 12/25/2016   PLT 201.0 12/25/2016   GLUCOSE 92 01/31/2017   CHOL 219 (H) 01/31/2017   TRIG 78.0 01/31/2017   HDL 74.60 01/31/2017   LDLCALC 128 (H) 01/31/2017   ALT 11 09/12/2016   AST 14 09/12/2016   NA 139 01/31/2017   K 4.8 01/31/2017   CL 105 01/31/2017   CREATININE 0.83 01/31/2017   BUN 19 01/31/2017   CO2 28 01/31/2017   TSH 1.52 09/12/2016    Dg Tibia/fibula Left  Result Date: 08/14/2016 CLINICAL DATA:  Anterior and medial tenderness over the mid tibial region for the past week without known injury. EXAM: LEFT TIBIA AND FIBULA - 2 VIEW COMPARISON:  None in PACs FINDINGS: The left tibia and fibula are adequately mineralized. There is no lytic or blastic lesion. There is no periosteal reaction. The overlying soft tissues are normal. There is an rounded bony density adjacent to the tip of the lateral malleolus which may reflect an accessory ossicle or old avulsion fracture fragment. The observed portions of the knee exhibit no acute abnormalities. IMPRESSION: No acute or significant chronic bony abnormality in the area of clinical concern. These soft tissues are unremarkable as well.  Electronically Signed   By: David  Martinique M.D.   On: 08/14/2016 15:40    Assessment & Plan:   There are no diagnoses linked to this encounter. I am having [name] [name] maintain her Botulinum Toxin Type A (Cosm), ALREX, Hyaluronic Acid (RESTYLANE IJ), fluticasone, tretinoin, fluocinonide cream, EPIPEN 2-PAK, Cyanocobalamin (VITAMIN B-12 PO), ICAPS AREDS 2, fexofenadine, meclizine, triamcinolone cream, Vitamin D (Ergocalciferol), Diclofenac Sodium, pantoprazole, PREMARIN, and ALPRAZolam.  No orders of the defined types were placed in this encounter.    Follow-up: No Follow-up on file.  [name], MD

## 2017-03-22 ENCOUNTER — Ambulatory Visit: Payer: Self-pay | Admitting: Internal Medicine

## 2017-03-22 ENCOUNTER — Ambulatory Visit: Payer: Self-pay | Admitting: Family Medicine

## 2017-03-25 ENCOUNTER — Ambulatory Visit (HOSPITAL_COMMUNITY)
Admission: RE | Admit: 2017-03-25 | Discharge: 2017-03-25 | Disposition: A | Discharge status: Home / Self Care | Payer: BLUE CROSS/BLUE SHIELD | Source: Ambulatory Visit | Attending: Cardiology | Admitting: Cardiology

## 2017-03-25 DIAGNOSIS — I6523 Occlusion and stenosis of bilateral carotid arteries: Secondary | ICD-10-CM

## 2017-03-25 DIAGNOSIS — G43909 Migraine, unspecified, not intractable, without status migrainosus: Secondary | ICD-10-CM | POA: Diagnosis not present

## 2017-04-01 ENCOUNTER — Ambulatory Visit (INDEPENDENT_AMBULATORY_CARE_PROVIDER_SITE_OTHER): Payer: BLUE CROSS/BLUE SHIELD | Admitting: Neurology

## 2017-04-01 ENCOUNTER — Encounter: Payer: Self-pay | Admitting: Neurology

## 2017-04-01 ENCOUNTER — Telehealth: Payer: Self-pay | Admitting: Internal Medicine

## 2017-04-01 VITALS — BP 115/71 | HR 71 | Ht 62.0 in | Wt 104.0 lb

## 2017-04-01 DIAGNOSIS — R253 Fasciculation: Secondary | ICD-10-CM

## 2017-04-01 DIAGNOSIS — R002 Palpitations: Secondary | ICD-10-CM | POA: Diagnosis not present

## 2017-04-01 NOTE — Telephone Encounter (Signed)
Pt returning phone call states she can be reached at [phone].

## 2017-04-01 NOTE — Patient Instructions (Signed)
Palpitations A palpitation is the feeling that your heartbeat is irregular or is faster than normal. It may feel like your heart is fluttering or skipping a beat. Palpitations are usually not a serious problem. They may be caused by many things, including smoking, caffeine, alcohol, stress, and certain medicines. Although most causes of palpitations are not serious, palpitations can be a sign of a serious medical problem. In some cases, you may need further medical evaluation. Follow these instructions at home: Pay attention to any changes in your symptoms. Take these actions to help with your condition:  Avoid the following: ? Caffeinated coffee, tea, soft drinks, diet pills, and energy drinks. ? Chocolate. ? Alcohol.  Do not use any tobacco products, such as cigarettes, chewing tobacco, and e-cigarettes. If you need help quitting, ask your health care provider.  Try to reduce your stress and anxiety. Things that can help you relax include: ? Yoga. ? Meditation. ? Physical activity, such as swimming, jogging, or walking. ? Biofeedback. This is a method that helps you learn to use your mind to control things in your body, such as your heartbeats.  Get plenty of rest and sleep.  Take over-the-counter and prescription medicines only as told by your health care provider.  Keep all follow-up visits as told by your health care provider. This is important.  Contact a health care provider if:  You continue to have a fast or irregular heartbeat after 24 hours.  Your palpitations occur more often. Get help right away if:  You have chest pain or shortness of breath.  You have a severe headache.  You feel dizzy or you faint. This information is not intended to replace advice given to you by your health care provider. Make sure you discuss any questions you have with your health care provider. Document Released: 06/01/2000 Document Revised: 11/07/2015 Document Reviewed: 02/17/2015 Elsevier  Interactive Patient Education  2017 Elsevier Inc.  

## 2017-04-01 NOTE — Progress Notes (Signed)
[facility]  Provider:  Dr Brett Fairy Referring Provider: Plotnikov, [name], MD Primary Care Physician:  Plotnikov, [name], MD    HPI:  [name] is a 66 y.o. female here as a patient of  Dr. [name] for follow up on her headaches.  I had the pleasure of seeing [name] today on 04/01/2017, she has noticed that she has more headaches since taking pantoprazole. She started an every other day regimen and noted that headaches aren't changing according to the day of intake. She still struggles with reflux however and is awaiting appointments with Dr. Dorann Lodge often Dr. Henrene Pastor to address his underlying condition. Reflux causes her to cough at night and in daytime. She has also noted some heart palpitations, transiently reported substernal chest wall fasciculations that seemed to have resolved as spontaneously as they came.   I have the pleasure of seeing [name] today on 12/18/2016. She reports that she has still freedom from headaches, her fasciculations have much resolved, the lifestyle changes she told me about in 2017 have really helped her. She does have some neck arthritis and she has noticed that sometimes pain seems to radiate from the neck towards  the head.  Headaches may be announced by an AURA, nausea, resolve with motrin, can be associated with phonophobia.  We wondered if we should obtain an MRI brain and cervical spine at the same time, I would be very much in favor of this, and [name] will still think about. In addition she reports that her husband plans to retire next year. She has noted that she sleeps best when alone. Her husband is a Insurance underwriter and is often not at home, she is worried that when they finally lift and sleep together in the same room that would be more interruption of her sleep but also her snoring may interrupt his sleep. For this reason we will undergo a home sleep test to see if she has apnea or is truly just snoring. Again,  most of the night she will spend alone and there is no witness to her asleep. She continues to be physically active, with a low body mass index and no medical conditions of concern at this time.   HPI- beginning 2012 :  The patient reports that she had 2 spells of near-syncope one in May 2014 and one in June of 2014 . She also has a history of migraines with aura, benign fasciculations which have affected also facial muscles, at times presenting like a tic. The benign fasciculations have been present for over 2 decades and are not related to a minor atrophic lateral sclerosis. Pulse of the near-syncope spells but the patient reported had a diplopia component and appeared to have been experienced as a vertical. The patient reports that the first spell was likely related to dehydration but is not quite sure about the second. The spells don't last long and if she takes fluid and fluid they quickly resolve and do not return. She was sent by her primary care physician for an MRI of the brain which returned normal. She has been seen by [name]  for the brief dizziness, loss of vision , diplopia. She is diagnosed of with macular degeneration early stage , myopia, and ocular migraines.  Vision was 20/25 in both eyes, ocular  pressure was 18 in the right, and 19 mm in the left eye and [name]  reported that the patient is early cataract formation but she can see well.The optic  nerves are healthy , but there is a macular drusen in the right eye he recommended ocular vitamins and to continue contact lens use the patient was otherwise found to be in good health ,and he did not impose any restrictions on driving activities of daily living etc. The detailed report is reviewed here today as is her MRI report which was entirely normal.She has meanwhile reduced or eliminated her caffeine intake and noticed a reduction in twitches. Her results of the EMG and NCV study have helped her to relax and fee safe with her condition.  She is snoring now, which doesn't bother her but her spouse. She has noted a feeling of thumping at the right ear and her ENT suggested that this may be a manifestation of her fasciculations. Her parents lives in [place], [place]. Close to her sister, her father is in a rehab facility. Her husband is an Emergency planning/management officer, now with [employer].   09-13-2015 Benign fasciculations resolved !  Patient presents now with an ear pain, left side only. Nothing visible on the left ear.  Her orthodontist noted some TMJ  click.  At the same time she had half sided headaches. These were not neuralgic and have resolved.  The patient does not recall having any zoster outbreaks, she did have some aphtose stomatitis non-herpetic. Dentist found no herpetic lesions and Hpe [name] tested for zoster- negative.  Since she does have a slight click at the left temporomandibular joint I recommend that she will purchase a baby toothbrush, one of those that are worn on top of the index finger like a thimble, and massages from the inside the area of the TMJ joint, she can use it on the outside as well.  This helps TMJ pain and click to disappear quicker. At this time I'm just happy that her symptoms have resolved and I'm happy to see her for any recurrent issues or new problems in the future.  02-15-2016, [name] has experienced an isolated event of diplopia that she related to the left eye. She also has a recurrence of migrainous headaches but they're all a has changed. She used to experience fortification lines which is  now associated with a very bright light perception. The visual aura the last 20-30 minutes. A lot of times there is no headache to follow. She experienced the diplopia before she saw her ophthalmologist [name], who then ordered a carotid Doppler study which returned with clinically insignificant results. Today, her headache is more like a pressure. She continued allergy shots with [name]. A few years  ago she has experienced vertical diplopia, and MRI in 2014  was negative- ordered by Dr [name] at the time  . She may take 3 Motrin in a week. I explained that migraines can change to this state with reaching the 5th decade of life. Many women that experienced severe nausea and headaches associated with menstrual cycles, has later in life the aura  without headaches to follow . Is benign fasciculations there is truly not changed there is no treatment available nor is it necessary. Is important for the patient to stay hydrated and could not get hypoglycemic as these will lead to presyncopal spells as well as an aunt who took and frequency rise in fasciculations, an  asked her to restrict caffeine intake.  03-27-2016,  patient reports resolution of headaches after quitting chocolate covered almonds ! No more diplopia , rare fasciculation. All tests were normal.     Review of Systems:  Out of a complete 14 system review, the patient complains of only the following symptoms, and all other reviewed systems are negative. Less twitching . Snoring reported again, she is complaining of nasal drip , eye itching . HST negative .She is treated by Warren Lacy, MD at [facility] allergy. She inquired about a sleep evaluation. I would like for her to use her nasal spray at night to see if she would stop mouth breathing which is the main reason for snoring. She reports diplopia, and related this to Dr [name] who in return evaluated her carotid artery- negative for significant stenosis.   Social History   Social History  . Marital status: Married    Spouse name: [name]  . Number of children: 0  . Years of education: 14   Occupational History  . 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 [employer]   . [employer]ICE [employer]   Social History Main Topics  . Smoking status: Never Smoker  . Smokeless tobacco: Never Used  . Alcohol use No  . Drug use: No  . Sexual activity: Not on file    Other Topics Concern  . Not on file   Social History Narrative   [employer]  in [place]. Married '90 - marriage is in very good shape '12, No children.  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 Offerred medical services for them if needed (Nov '11)   Patient is married [name]) and lives at home with her husband.   Patient is working Administrator, arts work.   Patient has a high school education.   Patient is right-handed.   Patient does not drink any caffeine.          Family History  Problem Relation Age of Onset  . Hyperlipidemia Father   . COPD Father        Smoker, deceased 2014/06/20  . Hypertension Father   . Hyperlipidemia Mother   . Hypertension Mother   . Diabetes Neg Hx   . Colon cancer Neg Hx   . Breast cancer Neg Hx   . Coronary artery disease Neg Hx   . Cancer Neg Hx        breast or colon    Past Medical History:  Diagnosis Date  . Allergic rhinitis, cause unspecified   . Backache, unspecified   . Benign fasciculation-cramp syndrome 12/03/2012    Worsening with  stress, fatigue .  [name] Calculus of kidney   . Conversion disorder   . Cotton wool spots   . Esophageal reflux   . Floater, vitreous, left 06-20-2016  . GERD (gastroesophageal reflux disease)   . Hemorrhage of rectum and anus   . Internal hemorrhoids without mention of complication   . Irritable bowel syndrome   . Microscopic hematuria   . Migraine headache with aura   . Mild aortic insufficiency   . Mild tricuspid regurgitation   . Osteopenia   . Other plastic surgery for unacceptable cosmetic appearance    Inj tx fllers/expander  . Personal history of urinary calculi   . Postmenopausal atrophic vaginitis   . Premature atrial contractions   . PVC (premature ventricular contraction)    a. Event monitor 2013.  [name] Unspecified constipation   . Vasovagal syncope     Past Surgical History:  Procedure  Laterality Date  . BREAST BIOPSY Right   . Cosmetic Procedures w/Injection therapy    .  SKIN CANCER EXCISION Left    thigh  . WISDOM TOOTH EXTRACTION      Current Outpatient Prescriptions  Medication Sig Dispense Refill  . ALPRAZolam (XANAX) 0.25 MG tablet 1 po bid prn anxiety 30 tablet 1  . ALREX 0.2 % SUSP Apply 0.2 drops to eye as needed (allergies).     . Botulinum Toxin Type A, Cosm, (BOTOX COSMETIC) 50 UNITS SOLR Inject 4-6 Units into the muscle. 3 month  injection    . Cyanocobalamin (VITAMIN B-12 PO) Take 1 tablet by mouth daily.    . Diclofenac Sodium (PENNSAID) 2 % SOLN Place 2 application onto the skin 2 (two) times daily. 112 g 3  . EPIPEN 2-PAK 0.3 MG/0.3ML SOAJ injection See admin instructions. Reported on 10/08/2015  1  . fexofenadine (ALLEGRA ALLERGY CHILDRENS) 30 MG tablet Take 30 mg by mouth 2 (two) times daily as needed (allergies).    . fluocinonide cream (LIDEX) 7.00 % Apply 1 application topically 2 (two) times daily. 90 g 2  . fluticasone (FLONASE) 50 MCG/ACT nasal spray Place 1 spray into both nostrils as needed for allergies.   5  . Hyaluronic Acid (RESTYLANE IJ) Inject as directed every 6 (six) months.    . meclizine (ANTIVERT) 25 MG tablet Take 1 tablet (25 mg total) by mouth 3 (three) times daily as needed for dizziness. 30 tablet 0  . Multiple Vitamins-Minerals (ICAPS AREDS 2) CAPS Take 1 capsule by mouth daily. Reported on 10/08/2015    . pantoprazole (PROTONIX) 40 MG tablet Take 1 tablet (40 mg total) by mouth daily. 90 tablet 3  . PREMARIN vaginal cream INSERT 1 GRAM VAGINALLY ONCE A WEEK AS DIRECTED 30 g 1  . tretinoin (RETIN-A) 0.05 % cream Apply 1 application topically at bedtime.    . triamcinolone cream (KENALOG) 0.1 % Apply 1 application topically 3 (three) times daily. 30 g 0  . Vitamin D, Ergocalciferol, (DRISDOL) 50000 units CAPS capsule Take 1 capsule (50,000 Units total) by mouth every 7 (seven) days. 12 capsule 0   No current facility-administered  medications for this visit.     Allergies as of 04/01/2017 - Review Complete 04/01/2017  Allergen Reaction Noted  . Codeine Other (See Comments)   . Doxycycline Other (See Comments) 05/31/2016  . Epinephrine  08/12/2012  . Neomycin  03/08/2015  . Penicillins Hives and Other (See Comments)   . Sulfonamide derivatives Other (See Comments)   . Zithromax [azithromycin] Rash 08/18/2014    Vitals: BP 115/71   Pulse 71   Ht 5\' 2"  (1.575 m)   Wt 104 lb (47.2 kg)   BMI 19.02 kg/m  Last Weight:  Wt Readings from Last 1 Encounters:  04/01/17 104 lb (47.2 kg)   Last Height:   Ht Readings from Last 1 Encounters:  04/01/17 5\' 2"  (1.575 m)   Vision Screening:  Left eye with correction 25/20.  Right eye with correction 25/20. Monovision, contacts.   Physical exam:  General: The patient is awake, alert and appears not in acute distress. The patient is well groomed. Head: Normocephalic, atraumatic. Neck is supple. Mallampati 1- wide open-  neck circumference 12.5 inches, No retrognathia. TMJ click on the right.  Cardiovascular:  Regular rate and rhythm, without  murmurs or carotid bruit, and without distended neck veins.  Respiratory: Lungs are clear to auscultation. Skin:  Without evidence of edema, or rash Trunk: BMI low normal.  Neurologic exam : The patient is awake and alert, oriented to place and time.  Memory subjective described as intact. There is a normal attention span & concentration ability.  Speech is fluent without  dysarthria, dysphonia or aphasia. Mood and affect are appropriate.  Cranial nerves: Pupils are equal and briskly reactive to light. Extraocular movements  in vertical and horizontal planes intact and without nystagmus.  Hearing to finger rub intact.  Facial sensation intact to fine touch. Facial motor strength is symmetric and tongue and uvula move midline. Motor exam: Normal tone and normal muscle bulk and symmetric normal strength in all extremities. No  fasciculations.  Sensory:  Fine touch, pinprick and vibration were normal  in all extremities.  Coordination: Rapid alternating movements in the fingers/hands isnormal.  Finger-to-nose maneuver tested and normal without evidence of ataxia, dysmetria or tremor. Gait and station: Patient walks without assistive device.  Deep tendon reflexes: in the upper and lower extremities are symmetric and intact.   Assessment:  After physical and neurologic examination, review of laboratory studies, imaging, neurophysiology testing and pre-existing records.  Again - headaches are much rarer now- but she has a neck tension component - negative MRI reviewed.  She noted a pantoprazole affecting the headache frequency and is looking with Dr.Plotnikov to replace this medication.  Benign fasciculations resolved ! She stayed off caffeine. She has palpitations, could be from reflux, too? Dr Henrene Pastor is her GI specialist. She is not drinking alcohol, not eating late.  Her orthodontist noted some TMJ click, will address snoring in a device- she has no need for CPAP. HST negative for apnea.    Headaches resolved since August after she stopped eating chocolate covered almonds. Diplopia horizontal, related to stress situations isolated events- , no repeat since early 2018    Yearly follow up next in July 2019.    Larey Seat, MD   CC Dr. [name]

## 2017-04-01 NOTE — Telephone Encounter (Signed)
Pt states the protonix that she is taking is causing her to have headaches. She states when she doesn't take it she does not have a headache so she feels the protonix is causing the headaches. Requesting something different. Please advise.

## 2017-04-01 NOTE — Telephone Encounter (Signed)
Attempted to call pt on cell number but received message that voicemail box is full and cannot accept new messages. Left message on pts home number for her to call back.

## 2017-04-02 MED ORDER — OMEPRAZOLE 40 MG PO CPDR: 40.0000 mg | DELAYED_RELEASE_CAPSULE | Freq: Every day | ORAL | 3 refills | Status: DC

## 2017-04-02 NOTE — Telephone Encounter (Signed)
Try omeprazole 

## 2017-04-02 NOTE — Telephone Encounter (Signed)
Spoke with pt and she is aware, Script sent to pharmacy.

## 2017-04-03 ENCOUNTER — Other Ambulatory Visit: Payer: Self-pay | Admitting: Internal Medicine

## 2017-04-03 ENCOUNTER — Telehealth: Payer: Self-pay | Admitting: Internal Medicine

## 2017-04-03 DIAGNOSIS — N952 Postmenopausal atrophic vaginitis: Secondary | ICD-10-CM

## 2017-04-03 MED ORDER — ESTROGENS, CONJUGATED 0.625 MG/GM VA CREA: TOPICAL_CREAM | Freq: Every day | VAGINAL | 1 refills | Status: DC

## 2017-04-03 NOTE — Telephone Encounter (Signed)
Pt called requesting a refill on her PREMARIN vaginal cream prescription to be sent to [facility] on [address]. It was previously prescribed for 1 gram a week but after talking with Dr [name] she has increased it to 1-3 grams a week. She wanted to make sure that the quantity was increased so that she would not run out of it before she is due. Her insurance is requiring a 90 day supply.

## 2017-04-03 NOTE — Telephone Encounter (Signed)
MD is out of the office pls advise on msg below../lmb 

## 2017-04-08 ENCOUNTER — Ambulatory Visit (INDEPENDENT_AMBULATORY_CARE_PROVIDER_SITE_OTHER): Payer: BLUE CROSS/BLUE SHIELD

## 2017-04-08 DIAGNOSIS — Z23 Encounter for immunization: Secondary | ICD-10-CM

## 2017-04-15 ENCOUNTER — Telehealth: Payer: Self-pay | Admitting: Internal Medicine

## 2017-04-15 DIAGNOSIS — N952 Postmenopausal atrophic vaginitis: Secondary | ICD-10-CM

## 2017-04-15 MED ORDER — ESTROGENS, CONJUGATED 0.625 MG/GM VA CREA: TOPICAL_CREAM | VAGINAL | 2 refills | Status: DC

## 2017-04-15 NOTE — Telephone Encounter (Signed)
Use PV once every 2-3 days Rx sent Thx

## 2017-04-15 NOTE — Telephone Encounter (Signed)
Please advise 

## 2017-04-15 NOTE — Telephone Encounter (Signed)
Pt called regarding her Premarin Vaginal Cream prescription. She was previously using in once a week but discussed increasing it to 2-3 times a week with Dr [name].  The last script that was sent by Dr Ronnald Ramp said once a day so she just wanted to make sure she should be using it 2-3 times a week instead. Please advise.

## 2017-04-16 NOTE — Telephone Encounter (Signed)
Pt notifed.

## 2017-05-02 ENCOUNTER — Telehealth: Payer: Self-pay | Admitting: Cardiovascular Disease

## 2017-05-02 NOTE — Telephone Encounter (Signed)
I spoke with pt. She is asking about carotid doppler study done in October. She states Dr. [name] told her she had minor carotid disease.  She is asking about exact numbers as she read report showing less than 40%. I explained to her that reports only give a range not exact number.  Pt asks what to do to prevent progression. I told her to watch diet and if on cholesterol medication to continue to take this. Pt questions whether she should be on cholesterol medication. This is followed by primary care. She has no known CAD.  I asked her to follow up with primary care regarding cholesterol management.

## 2017-05-02 NOTE — Telephone Encounter (Signed)
Mrs.Lauritsen is calling with questions about a test that she had. Please call

## 2017-05-28 ENCOUNTER — Encounter: Payer: Self-pay | Admitting: Internal Medicine

## 2017-05-28 NOTE — Telephone Encounter (Signed)
Call when office reopens

## 2017-05-29 NOTE — Telephone Encounter (Signed)
Patient is going to see Dr. Camila Li on 12/20.

## 2017-06-06 ENCOUNTER — Encounter: Payer: Self-pay | Admitting: Internal Medicine

## 2017-06-06 ENCOUNTER — Ambulatory Visit: Payer: BLUE CROSS/BLUE SHIELD | Admitting: Internal Medicine

## 2017-06-06 DIAGNOSIS — R05 Cough: Secondary | ICD-10-CM | POA: Diagnosis not present

## 2017-06-06 DIAGNOSIS — K219 Gastro-esophageal reflux disease without esophagitis: Secondary | ICD-10-CM | POA: Diagnosis not present

## 2017-06-06 DIAGNOSIS — R059 Cough, unspecified: Secondary | ICD-10-CM

## 2017-06-06 MED ORDER — DEXLANSOPRAZOLE 30 MG PO CPDR: 30.0000 mg | DELAYED_RELEASE_CAPSULE | Freq: Every day | ORAL | 11 refills | Status: DC

## 2017-06-06 NOTE — Assessment & Plan Note (Signed)
Dexilant po

## 2017-06-06 NOTE — Progress Notes (Signed)
Subjective:  Patient ID: [name], female    DOB: Dec 21, 1950  Age: 65 y.o. MRN: [id number]  CC: No chief complaint on file.   HPI [name] presents for GERD/cough. In the past Dr Melvyn Novas 2014-16 gave the pt  Protonix/Zantac. C/o HAs on Omeprazole. Not working too well...  Outpatient Medications Prior to Visit  Medication Sig Dispense Refill  . ALPRAZolam (XANAX) 0.25 MG tablet 1 po bid prn anxiety 30 tablet 1  . ALREX 0.2 % SUSP Apply 0.2 drops to eye as needed (allergies).     . Botulinum Toxin Type A, Cosm, (BOTOX COSMETIC) 50 UNITS SOLR Inject 4-6 Units into the muscle. 3 month  injection    . conjugated estrogens (PREMARIN) vaginal cream Use PV once every 2-3 days 90 g 2  . Cyanocobalamin (VITAMIN B-12 PO) Take 1 tablet by mouth daily.    . Diclofenac Sodium (PENNSAID) 2 % SOLN Place 2 application onto the skin 2 (two) times daily. 112 g 3  . EPIPEN 2-PAK 0.3 MG/0.3ML SOAJ injection See admin instructions. Reported on 10/08/2015  1  . fexofenadine (ALLEGRA ALLERGY CHILDRENS) 30 MG tablet Take 30 mg by mouth 2 (two) times daily as needed (allergies).    . fluocinonide cream (LIDEX) 0.27 % Apply 1 application topically 2 (two) times daily. 90 g 2  . fluticasone (FLONASE) 50 MCG/ACT nasal spray Place 1 spray into both nostrils as needed for allergies.   5  . Hyaluronic Acid (RESTYLANE IJ) Inject as directed every 6 (six) months.    . meclizine (ANTIVERT) 25 MG tablet Take 1 tablet (25 mg total) by mouth 3 (three) times daily as needed for dizziness. 30 tablet 0  . Multiple Vitamins-Minerals (ICAPS AREDS 2) CAPS Take 1 capsule by mouth daily. Reported on 10/08/2015    . omeprazole (PRILOSEC) 40 MG capsule Take 1 capsule (40 mg total) by mouth daily. 90 capsule 3  . pantoprazole (PROTONIX) 40 MG tablet Take 1 tablet (40 mg total) by mouth daily. 90 tablet 3  . tretinoin (RETIN-A) 0.05 % cream Apply 1 application topically at bedtime.    . triamcinolone cream (KENALOG) 0.1 %  Apply 1 application topically 3 (three) times daily. 30 g 0  . Vitamin D, Ergocalciferol, (DRISDOL) 50000 units CAPS capsule Take 1 capsule (50,000 Units total) by mouth every 7 (seven) days. 12 capsule 0   No facility-administered medications prior to visit.     ROS Review of Systems  Constitutional: Negative for activity change, appetite change, chills, fatigue and unexpected weight change.  HENT: Negative for congestion, mouth sores and sinus pressure.   Eyes: Negative for visual disturbance.  Respiratory: Positive for cough. Negative for chest tightness.   Gastrointestinal: Negative for abdominal pain and nausea.  Genitourinary: Negative for difficulty urinating, frequency and vaginal pain.  Musculoskeletal: Negative for back pain and gait problem.  Skin: Negative for pallor and rash.  Neurological: Positive for headaches. Negative for dizziness, tremors, weakness and numbness.  Psychiatric/Behavioral: Negative for confusion and sleep disturbance.    Objective:  BP 122/72   Pulse 78   Temp 98.5 F (36.9 C)   Wt 104 lb (47.2 kg)   SpO2 99%   BMI 19.02 kg/m   BP Readings from Last 3 Encounters:  06/06/17 122/72  04/01/17 115/71  03/21/17 118/72    Wt Readings from Last 3 Encounters:  06/06/17 104 lb (47.2 kg)  04/01/17 104 lb (47.2 kg)  03/21/17 104 lb (47.2 kg)    Physical Exam  Constitutional: She appears well-developed. No distress.  HENT:  Head: Normocephalic.  Right Ear: External ear normal.  Left Ear: External ear normal.  Nose: Nose normal.  Mouth/Throat: Oropharynx is clear and moist.  Eyes: Conjunctivae are normal. Pupils are equal, round, and reactive to light. Right eye exhibits no discharge. Left eye exhibits no discharge.  Neck: Normal [name] of motion. Neck supple. No JVD present. No tracheal deviation present. No thyromegaly present.  Cardiovascular: Normal rate, regular rhythm and normal heart sounds.  Pulmonary/Chest: No stridor. No respiratory  distress. She has no wheezes.  Abdominal: Soft. Bowel sounds are normal. She exhibits no distension and no mass. There is no tenderness. There is no rebound and no guarding.  Musculoskeletal: She exhibits no edema or tenderness.  Lymphadenopathy:    She has no cervical adenopathy.  Neurological: She displays normal reflexes. No cranial nerve deficit. She exhibits normal muscle tone. Coordination normal.  Skin: No rash noted. No erythema.  Psychiatric: She has a normal mood and affect. Her behavior is normal. Judgment and thought content normal.    Lab Results  Component Value Date   WBC 8.0 12/25/2016   HGB 12.8 12/25/2016   HCT 38.0 12/25/2016   PLT 201.0 12/25/2016   GLUCOSE 92 01/31/2017   CHOL 219 (H) 01/31/2017   TRIG 78.0 01/31/2017   HDL 74.60 01/31/2017   LDLCALC 128 (H) 01/31/2017   ALT 11 09/12/2016   AST 14 09/12/2016   NA 139 01/31/2017   K 4.8 01/31/2017   CL 105 01/31/2017   CREATININE 0.83 01/31/2017   BUN 19 01/31/2017   CO2 28 01/31/2017   TSH 1.52 09/12/2016    No results found.  Assessment & Plan:   There are no diagnoses linked to this encounter. I am having [name] maintain her Botulinum Toxin Type A (Cosm), ALREX, Hyaluronic Acid (RESTYLANE IJ), fluticasone, tretinoin, fluocinonide cream, EPIPEN 2-PAK, Cyanocobalamin (VITAMIN B-12 PO), ICAPS AREDS 2, fexofenadine, meclizine, triamcinolone cream, Vitamin D (Ergocalciferol), Diclofenac Sodium, pantoprazole, ALPRAZolam, omeprazole, and conjugated estrogens.  No orders of the defined types were placed in this encounter.    Follow-up: No Follow-up on file.  [name], MD

## 2017-06-06 NOTE — Assessment & Plan Note (Signed)
12/18 Dexilant trial

## 2017-06-10 ENCOUNTER — Ambulatory Visit: Payer: Self-pay | Admitting: Internal Medicine

## 2017-07-03 NOTE — Progress Notes (Signed)
[name] [place], [name] [address] Phone: [phone] Subjective:    I'm seeing this patient by the request  of:    CC: Back pain  RFF:[id number]  [name] is a 67 y.o. female coming in for follow up for back pain. She said that her lower back pain has not gone away since last visit. Most of her pain is immediately in the morning. She said that it feels like a bruise. She notices pain when perform hip flexion abdominal exercises. Denies any radiating symptoms. As rest she does have intermittent, achy pain.     X-rays taken June 17, 2015 were independently visualized by me.  Showed multilevel arthropathy with facet hypertrophy.  Past Medical History:  Diagnosis Date  . Allergic rhinitis, cause unspecified   . Backache, unspecified   . Benign fasciculation-cramp syndrome 12/03/2012    Worsening with  stress, fatigue .  [name] Calculus of kidney   . Conversion disorder   . Cotton wool spots   . Esophageal reflux   . Floater, vitreous, left 05/2016  . GERD (gastroesophageal reflux disease)   . Hemorrhage of rectum and anus   . Internal hemorrhoids without mention of complication   . Irritable bowel syndrome   . Microscopic hematuria   . Migraine headache with aura   . Mild aortic insufficiency   . Mild tricuspid regurgitation   . Osteopenia   . Other plastic surgery for unacceptable cosmetic appearance    Inj tx fllers/expander  . Personal history of urinary calculi   . Postmenopausal atrophic vaginitis   . Premature atrial contractions   . PVC (premature ventricular contraction)    a. Event monitor 2013.  [name] Unspecified constipation   . Vasovagal syncope    Past Surgical History:  Procedure Laterality Date  . BREAST BIOPSY Right   . Cosmetic Procedures w/Injection therapy    . SKIN CANCER EXCISION Left    thigh  . WISDOM TOOTH EXTRACTION     Social History   Socioeconomic History  . Marital status: Married   Spouse name: [name]  . Number of children: 0  . Years of education: 33  . Highest education level: None  Social Needs  . Financial resource strain: None  . Food insecurity - worry: None  . Food insecurity - inability: None  . Transportation needs - medical: None  . Transportation needs - non-medical: None  Occupational History  . Occupation: OFFICE liasion    Employer: [employer]  . Occupation: Engineer, structural  . Occupation: OFFICE LEAZON    Employer: [employer]  Tobacco Use  . Smoking status: Never Smoker  . Smokeless tobacco: Never Used  Substance and Sexual Activity  . Alcohol use: No  . Drug use: No  . Sexual activity: None  Other Topics Concern  . 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 Married '90 - marriage is in very good shape '12, No children.  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 Offerred medical services for them if needed (Nov '11)   Patient is married [name]) and lives at home with her husband.   Patient is working Administrator, arts work.   Patient has a high school education.   Patient is right-handed.   Patient does not drink any caffeine.         Allergies  Allergen Reactions  .  Codeine Other (See Comments)    Doesn't remeber  . Doxycycline Other (See Comments)  . Epinephrine     Heart racing   . Neomycin     Topical rash from cream  . Penicillins Hives and Other (See Comments)    Has patient had a PCN reaction causing immediate rash, facial/tongue/throat swelling, SOB or lightheadedness with hypotension: Yes Has patient had a PCN reaction causing severe rash involving mucus membranes or skin necrosis: Yes Has patient had a PCN reaction that required hospitalization No Has patient had a PCN reaction occurring within the last 10 years: No If all of the above answers are "NO", then may proceed with Cephalosporin use.   .  Protonix [Pantoprazole Sodium]     ?HAs  . Sulfonamide Derivatives Other (See Comments)    Doesn't remember   . Zithromax [Azithromycin] Rash   Family History  Problem Relation Age of Onset  . Hyperlipidemia Father   . COPD Father        Smoker, deceased 07-05-2014  . Hypertension Father   . Hyperlipidemia Mother   . Hypertension Mother   . Diabetes Neg Hx   . Colon cancer Neg Hx   . Breast cancer Neg Hx   . Coronary artery disease Neg Hx   . Cancer Neg Hx        breast or colon     Past medical history, social, surgical and family history all reviewed in electronic medical record.  No pertanent information unless stated regarding to the chief complaint.   Review of Systems:Review of systems updated and as accurate as of 07/04/17  No headache, visual changes, nausea, vomiting, diarrhea, constipation, dizziness, abdominal pain, skin rash, fevers, chills, night sweats, weight loss, swollen lymph nodes, body aches, joint swelling, muscle aches, chest pain, shortness of breath, mood changes.   Objective  Blood pressure 100/68, pulse 79, height 5\' 2"  (1.575 m), weight 102 lb (46.3 kg), SpO2 98 %. Systems examined below as of 07/04/17   General: No apparent distress alert and oriented x3 mood and affect normal, dressed appropriately.  HEENT: Pupils equal, extraocular movements intact  Respiratory: Patient's speak in full sentences and does not appear short of breath  Cardiovascular: No lower extremity edema, non tender, no erythema  Skin: Warm dry intact with no signs of infection or rash on extremities or on axial skeleton.  Abdomen: Soft nontender  Neuro: Cranial nerves II through XII are intact, neurovascularly intact in all extremities with 2+ DTRs and 2+ pulses.  Lymph: No lymphadenopathy of posterior or anterior cervical chain or axillae bilaterally.  Gait normal with good balance and coordination.  MSK:  Non tender with full range of motion and good stability and symmetric  strength and tone of shoulders, elbows, wrist, hip, knee and ankles bilaterally.  Back Exam:  Inspection: Patient does have a palpable step-off noted at L3-L4 Motion: Flexion 45 deg, Extension 25 deg, Side Bending to 45 deg bilaterally,  Rotation to 35 deg bilaterally  SLR laying: Negative  XSLR laying: Negative  Palpable tenderness: Mild tenderness to palpation of the paraspinal musculature lumbar spine. FABER: negative. Sensory change: Gross sensation intact to all lumbar and sacral dermatomes.  Reflexes: 2+ at both patellar tendons, 2+ at achilles tendons, Babinski's downgoing.  Strength at foot  Plantar-flexion: 5/5 Dorsi-flexion: 5/5 Eversion: 5/5 Inversion: 5/5  Leg strength  Quad: 5/5 Hamstring: 5/5 Hip flexor: 5/5 Hip abductors: 5/5  Gait unremarkable.    Impression and Recommendations:     This  case required medical decision making of moderate complexity.      Note: This dictation was prepared with Dragon dictation along with smaller phrase technology. Any transcriptional errors that result from this process are unintentional.

## 2017-07-04 ENCOUNTER — Ambulatory Visit (INDEPENDENT_AMBULATORY_CARE_PROVIDER_SITE_OTHER): Payer: BLUE CROSS/BLUE SHIELD | Admitting: Family Medicine

## 2017-07-04 ENCOUNTER — Encounter: Payer: Self-pay | Admitting: Family Medicine

## 2017-07-04 DIAGNOSIS — M5136 Other intervertebral disc degeneration, lumbar region: Secondary | ICD-10-CM | POA: Diagnosis not present

## 2017-07-04 NOTE — Patient Instructions (Signed)
Good to see you  spondylarthrosis  Ice after activity  Myfitnesspal for 3 days Goal protein would be at least 75grams daily  [employer] sport prtein or collegen  [place], foster and son or beauty rest black  Exercises 3 times a week.  See me again in 4 weeks

## 2017-07-04 NOTE — Assessment & Plan Note (Addendum)
DDD lumbar  We discussed HEP, what to avoid like extension. Discussed diet  RTC in 4 weeks

## 2017-07-08 ENCOUNTER — Telehealth: Payer: Self-pay | Admitting: Cardiovascular Disease

## 2017-07-12 NOTE — Telephone Encounter (Signed)
Closed Encounter  °

## 2017-07-15 ENCOUNTER — Telehealth: Payer: Self-pay | Admitting: Family Medicine

## 2017-07-15 NOTE — Telephone Encounter (Signed)
Spoke to pt, she stated that you recommended the [employer] mattress. She stated her & her husband went to go purchase a mattress but there were different versions of this particular mattress. She would like for you to let her know which one you recommend of that line.

## 2017-07-15 NOTE — Telephone Encounter (Signed)
Copied from [place] [phone]. Topic: General - Other >> Jul 15, 2017 11:18 AM Darl Householder, RMA wrote: Reason for CRM: patient is requesting a call back from Dr. Tamala Julian concerning follow up questions about mattresses for back

## 2017-07-15 NOTE — Telephone Encounter (Signed)
Tel her likely the firmer the better.

## 2017-07-16 ENCOUNTER — Telehealth: Payer: Self-pay | Admitting: Internal Medicine

## 2017-07-16 DIAGNOSIS — M549 Dorsalgia, unspecified: Secondary | ICD-10-CM

## 2017-07-16 NOTE — Telephone Encounter (Signed)
Copied from [place]. Topic: Quick Communication - See Telephone Encounter >> Jul 16, 2017  3:32 PM Cleaster Corin, NT wrote: CRM for notification. See Telephone encounter for:   07/16/17. Pt. Calling to see if she can set up to have a urine test done pt. Still having  back pain already seen Dr. Tamala Julian but still having back pain.pt Can be reached at [phone]

## 2017-07-16 NOTE — Telephone Encounter (Signed)
Per Dr. Tamala Julian recommends the K-class firm pillow top.  Pt made aware.

## 2017-07-16 NOTE — Telephone Encounter (Signed)
Please advise 

## 2017-07-17 NOTE — Telephone Encounter (Signed)
OK UA Thx

## 2017-07-17 NOTE — Telephone Encounter (Signed)
UA ordered, pt notified.

## 2017-07-22 ENCOUNTER — Other Ambulatory Visit: Payer: Self-pay | Admitting: *Deleted

## 2017-07-22 ENCOUNTER — Other Ambulatory Visit (INDEPENDENT_AMBULATORY_CARE_PROVIDER_SITE_OTHER): Payer: BLUE CROSS/BLUE SHIELD

## 2017-07-22 DIAGNOSIS — M47819 Spondylosis without myelopathy or radiculopathy, site unspecified: Secondary | ICD-10-CM

## 2017-07-22 DIAGNOSIS — M549 Dorsalgia, unspecified: Secondary | ICD-10-CM

## 2017-07-22 LAB — URINALYSIS
BILIRUBIN URINE: NEGATIVE
Ketones, ur: NEGATIVE
LEUKOCYTES UA: NEGATIVE
NITRITE: NEGATIVE
Specific Gravity, Urine: 1.025 (ref 1.000–1.030)
Total Protein, Urine: NEGATIVE
UROBILINOGEN UA: 0.2 (ref 0.0–1.0)
Urine Glucose: NEGATIVE
pH: 5.5 (ref 5.0–8.0)

## 2017-07-22 LAB — SEDIMENTATION RATE: Sed Rate: 6 mm/hr (ref 0–30)

## 2017-07-23 LAB — URINE CULTURE
MICRO NUMBER: 90146898
Result:: NO GROWTH
SPECIMEN QUALITY:: ADEQUATE

## 2017-07-24 ENCOUNTER — Ambulatory Visit: Payer: Self-pay | Admitting: Internal Medicine

## 2017-07-25 LAB — ANA: Anti Nuclear Antibody(ANA): POSITIVE — AB

## 2017-07-25 LAB — ANTI-NUCLEAR AB-TITER (ANA TITER)

## 2017-07-25 LAB — HLA-B27 ANTIGEN: HLA-B27 Antigen: NEGATIVE

## 2017-07-26 ENCOUNTER — Encounter: Payer: Self-pay | Admitting: Internal Medicine

## 2017-07-26 ENCOUNTER — Ambulatory Visit: Payer: BLUE CROSS/BLUE SHIELD | Admitting: Internal Medicine

## 2017-07-26 DIAGNOSIS — M2550 Pain in unspecified joint: Secondary | ICD-10-CM | POA: Diagnosis not present

## 2017-07-26 DIAGNOSIS — R768 Other specified abnormal immunological findings in serum: Secondary | ICD-10-CM | POA: Diagnosis not present

## 2017-07-26 DIAGNOSIS — R21 Rash and other nonspecific skin eruption: Secondary | ICD-10-CM | POA: Diagnosis not present

## 2017-07-26 NOTE — Assessment & Plan Note (Signed)
Dry skin vs other

## 2017-07-26 NOTE — Assessment & Plan Note (Signed)
CTD vs Rystalane inj vs other Rhem ref

## 2017-07-26 NOTE — Assessment & Plan Note (Signed)
CTD vs Rystalane inj vs other 2019 High ANA titer

## 2017-07-26 NOTE — Progress Notes (Signed)
Subjective:  Patient ID: [name], female    DOB: 1950-10-10  Age: 67 y.o. MRN: [id number]  CC: No chief complaint on file.   HPI Farrin Shadle presents for LBP worse in the past few weeks, neck pain and elevated ANA The pt had skin rash on the back - Dr Delman Cheadle did the bx, occ mouth sores  Outpatient Medications Prior to Visit  Medication Sig Dispense Refill  . conjugated estrogens (PREMARIN) vaginal cream Use PV once every 2-3 days 90 g 2  . Dexlansoprazole (DEXILANT) 30 MG capsule Take 1 capsule (30 mg total) by mouth daily. 30 capsule 11  . ALPRAZolam (XANAX) 0.25 MG tablet 1 po bid prn anxiety (Patient not taking: Reported on 07/04/2017) 30 tablet 1  . ALREX 0.2 % SUSP Apply 0.2 drops to eye as needed (allergies).     . Botulinum Toxin Type A, Cosm, (BOTOX COSMETIC) 50 UNITS SOLR Inject 4-6 Units into the muscle. 3 month  injection    . Cyanocobalamin (VITAMIN B-12 PO) Take 1 tablet by mouth daily.    . Diclofenac Sodium (PENNSAID) 2 % SOLN Place 2 application onto the skin 2 (two) times daily. (Patient not taking: Reported on 07/26/2017) 112 g 3  . EPIPEN 2-PAK 0.3 MG/0.3ML SOAJ injection See admin instructions. Reported on 10/08/2015  1  . fexofenadine (ALLEGRA ALLERGY CHILDRENS) 30 MG tablet Take 30 mg by mouth 2 (two) times daily as needed (allergies).    . fluocinonide cream (LIDEX) 7.25 % Apply 1 application topically 2 (two) times daily. (Patient not taking: Reported on 07/26/2017) 90 g 2  . fluticasone (FLONASE) 50 MCG/ACT nasal spray Place 1 spray into both nostrils as needed for allergies.   5  . Hyaluronic Acid (RESTYLANE IJ) Inject as directed every 6 (six) months.    . meclizine (ANTIVERT) 25 MG tablet Take 1 tablet (25 mg total) by mouth 3 (three) times daily as needed for dizziness. (Patient not taking: Reported on 07/26/2017) 30 tablet 0  . Multiple Vitamins-Minerals (ICAPS AREDS 2) CAPS Take 1 capsule by mouth daily. Reported on 10/08/2015    . tretinoin (RETIN-A)  0.05 % cream Apply 1 application topically at bedtime.    . triamcinolone cream (KENALOG) 0.1 % Apply 1 application topically 3 (three) times daily. (Patient not taking: Reported on 07/26/2017) 30 g 0   No facility-administered medications prior to visit.     ROS Review of Systems  Constitutional: Negative for activity change, appetite change, chills, fatigue and unexpected weight change.  HENT: Negative for congestion, mouth sores and sinus pressure.   Eyes: Negative for visual disturbance.  Respiratory: Negative for cough and chest tightness.   Gastrointestinal: Negative for abdominal pain and nausea.  Genitourinary: Negative for difficulty urinating, frequency and vaginal pain.  Musculoskeletal: Positive for arthralgias, back pain and gait problem.  Skin: Negative for pallor and rash.  Neurological: Negative for dizziness, tremors, weakness, numbness and headaches.  Psychiatric/Behavioral: Negative for confusion and sleep disturbance.    Objective:  BP 110/70 (BP Location: Left Arm, Patient Position: Sitting, Cuff Size: Normal)   Pulse 74   Temp 98.8 F (37.1 C) (Oral)   Ht 5\' 2"  (1.575 m)   Wt 102 lb 4 oz (46.4 kg)   SpO2 99%   BMI 18.70 kg/m   BP Readings from Last 3 Encounters:  07/26/17 110/70  07/04/17 100/68  06/06/17 122/72    Wt Readings from Last 3 Encounters:  07/26/17 102 lb 4 oz (46.4 kg)  07/04/17  102 lb (46.3 kg)  06/06/17 104 lb (47.2 kg)    Physical Exam  Constitutional: She appears well-developed. No distress.  HENT:  Head: Normocephalic.  Right Ear: External ear normal.  Left Ear: External ear normal.  Nose: Nose normal.  Mouth/Throat: Oropharynx is clear and moist.  Eyes: Conjunctivae are normal. Pupils are equal, round, and reactive to light. Right eye exhibits no discharge. Left eye exhibits no discharge.  Neck: Normal [name] of motion. Neck supple. No JVD present. No tracheal deviation present. No thyromegaly present.  Cardiovascular: Normal  rate, regular rhythm and normal heart sounds.  Pulmonary/Chest: No stridor. No respiratory distress. She has no wheezes.  Abdominal: Soft. Bowel sounds are normal. She exhibits no distension and no mass. There is no tenderness. There is no rebound and no guarding.  Musculoskeletal: She exhibits tenderness. She exhibits no edema.  Lymphadenopathy:    She has no cervical adenopathy.  Neurological: She displays normal reflexes. No cranial nerve deficit. She exhibits normal muscle tone. Coordination normal.  Skin: No rash noted. No erythema.  Psychiatric: She has a normal mood and affect. Her behavior is normal. Judgment and thought content normal.  The skin rash on the back (telephone pictures)  Lab Results  Component Value Date   WBC 8.0 12/25/2016   HGB 12.8 12/25/2016   HCT 38.0 12/25/2016   PLT 201.0 12/25/2016   GLUCOSE 92 01/31/2017   CHOL 219 (H) 01/31/2017   TRIG 78.0 01/31/2017   HDL 74.60 01/31/2017   LDLCALC 128 (H) 01/31/2017   ALT 11 09/12/2016   AST 14 09/12/2016   NA 139 01/31/2017   K 4.8 01/31/2017   CL 105 01/31/2017   CREATININE 0.83 01/31/2017   BUN 19 01/31/2017   CO2 28 01/31/2017   TSH 1.52 09/12/2016    Vas US Carotid  Result Date: 03/25/2017 Carotid Arterial Duplex Study Indications:  Migraines. Risk Factors: No history of smoking. Examination Guidelines: A complete evaluation includes B-mode imaging, spectral doppler, color doppler, and power doppler as needed of all accessible portions of each vessel. Bilateral testing is considered an integral part of a complete examination. Limited examinations for reoccurring indications may be performed as noted. Right Carotid Findings: +----------+--------+--------+------------+------------+--------+           PSV cm/sEDV cm/sStenosis    Describe    Comments +----------+--------+--------+------------+------------+--------+ CCA Prox  159     26                                        +----------+--------+--------+------------+------------+--------+ CCA Distal-123    -37                                      +----------+--------+--------+------------+------------+--------+ ICA Prox  -162    -30     Stable 1-39%heterogenous         +----------+--------+--------+------------+------------+--------+ ICA Mid   103     30                                       +----------+--------+--------+------------+------------+--------+ ICA Distal-87     -41                                      +----------+--------+--------+------------+------------+--------+  ECA       -115    -11                                      +----------+--------+--------+------------+------------+--------+ +----------+--------+-------+----------------+------------+           PSV cm/sEDV cmsDescribe        Arm Pressure +----------+--------+-------+----------------+------------+ Subclavian134     0      Multiphasic, [id number]          +----------+--------+-------+----------------+------------+ +---------+--------+---+--------+---+---------+ VertebralPSV cm/s-71EDV cm/s-21Antegrade +---------+--------+---+--------+---+---------+  Left Carotid Findings: +----------+--------+--------+------------+------------+--------+           PSV cm/sEDV cm/sStenosis    Describe    Comments +----------+--------+--------+------------+------------+--------+ CCA Prox  150     34                                       +----------+--------+--------+------------+------------+--------+ CCA Distal-134    -41                                      +----------+--------+--------+------------+------------+--------+ ICA Prox  -104    -31     Stable 1-39%heterogenous         +----------+--------+--------+------------+------------+--------+ ICA Mid   -94     -37                                      +----------+--------+--------+------------+------------+--------+ ICA Distal-106    -41                                       +----------+--------+--------+------------+------------+--------+ ECA       -97     -17                                      +----------+--------+--------+------------+------------+--------+ +----------+--------+--------+----------------+------------+ SubclavianPSV cm/sEDV cm/sDescribe        Arm Pressure +----------+--------+--------+----------------+------------+           149     0       Multiphasic, [id number]          +----------+--------+--------+----------------+------------+ +---------+--------+---+--------+---+---------+ VertebralPSV cm/s-72EDV cm/s-19Antegrade +---------+--------+---+--------+---+---------+  Final Interpretation: Right Carotid: There is evidence in the right ICA of a less than 40% stenosis. Left Carotid: There is evidence in the left ICA of a less than 40% stenosis.  *See table(s) above for measurements and observations.  Electronically signed by Quay Burow on 03/25/2017 at 5:58:22 PM.   Assessment & Plan:   There are no diagnoses linked to this encounter. I am having [name] maintain her Botulinum Toxin Type A (Cosm), ALREX, Hyaluronic Acid (RESTYLANE IJ), fluticasone, tretinoin, fluocinonide cream, EPIPEN 2-PAK, Cyanocobalamin (VITAMIN B-12 PO), ICAPS AREDS 2, fexofenadine, meclizine, triamcinolone cream, Diclofenac Sodium, ALPRAZolam, conjugated estrogens, and Dexlansoprazole.  No orders of the defined types were placed in this encounter.    Follow-up: No Follow-up on file.  [name], MD

## 2017-07-28 ENCOUNTER — Encounter: Payer: Self-pay | Admitting: Internal Medicine

## 2017-07-29 ENCOUNTER — Encounter: Payer: Self-pay | Admitting: Family Medicine

## 2017-08-01 ENCOUNTER — Encounter: Payer: Self-pay | Admitting: Internal Medicine

## 2017-08-02 ENCOUNTER — Telehealth: Payer: Self-pay | Admitting: Internal Medicine

## 2017-08-02 ENCOUNTER — Encounter: Payer: Self-pay | Admitting: Family Medicine

## 2017-08-02 NOTE — Telephone Encounter (Signed)
Pt states she has been having problems with her hemorrhoids. Discussed with pt that she can try prep h suppositories  otc, recticare, and sitz bathes. Pt scheduled an appt to see Dr. Henrene Pastor 09/17/17@10 :45am to discuss if she is a candidate for banding. Pt knows that Dr. Henrene Pastor does not do the banding and she would be referred to another doctor in the practice to have this done.

## 2017-08-02 NOTE — Telephone Encounter (Signed)
Patient states she is having problems with her hemorrhoids and would like to speak with nurse.

## 2017-08-04 ENCOUNTER — Other Ambulatory Visit: Payer: Self-pay | Admitting: Internal Medicine

## 2017-08-05 ENCOUNTER — Encounter: Payer: Self-pay | Admitting: Internal Medicine

## 2017-08-07 ENCOUNTER — Encounter: Payer: Self-pay | Admitting: Internal Medicine

## 2017-08-08 ENCOUNTER — Ambulatory Visit (INDEPENDENT_AMBULATORY_CARE_PROVIDER_SITE_OTHER)
Admission: RE | Admit: 2017-08-08 | Discharge: 2017-08-08 | Disposition: A | Discharge status: Home / Self Care | Payer: BLUE CROSS/BLUE SHIELD | Source: Ambulatory Visit | Attending: Internal Medicine | Admitting: Internal Medicine

## 2017-08-08 ENCOUNTER — Other Ambulatory Visit: Payer: Self-pay | Admitting: Internal Medicine

## 2017-08-08 DIAGNOSIS — G8929 Other chronic pain: Secondary | ICD-10-CM | POA: Diagnosis not present

## 2017-08-08 DIAGNOSIS — M545 Low back pain, unspecified: Secondary | ICD-10-CM

## 2017-08-08 NOTE — Telephone Encounter (Signed)
Spoke to pt and she is aware of the order for Lumbar Spine xray

## 2017-08-12 ENCOUNTER — Encounter: Payer: Self-pay | Admitting: Internal Medicine

## 2017-08-12 ENCOUNTER — Ambulatory Visit (HOSPITAL_COMMUNITY): Payer: BLUE CROSS/BLUE SHIELD | Attending: Cardiovascular Disease

## 2017-08-12 ENCOUNTER — Other Ambulatory Visit: Payer: Self-pay

## 2017-08-12 DIAGNOSIS — I351 Nonrheumatic aortic (valve) insufficiency: Secondary | ICD-10-CM | POA: Diagnosis not present

## 2017-08-12 DIAGNOSIS — I359 Nonrheumatic aortic valve disorder, unspecified: Secondary | ICD-10-CM | POA: Diagnosis present

## 2017-08-12 DIAGNOSIS — I472 Ventricular tachycardia: Secondary | ICD-10-CM | POA: Insufficient documentation

## 2017-08-12 NOTE — Progress Notes (Signed)
[name] [address], [place] [address] Phone: [phone] Subjective:    I'm seeing this patient by the request  of:    CC: Back and neck pain follow-up  [id number]  [name] is a 67 y.o. female coming in with complaint of back and neck pain follow-up.  Patient was having increasing discomfort.  Patient was sent also for labs.  Found to have a positive ANA with a 06-1278 titer. Patient states that she is not doing very well as she has had an increase in pain. She is tender to palpation. She feels better with physical activity than rest.    She did have x-rays of the lumbar spine some degenerative changes of the lumbar spine moderate in nature.  These were independently visualized by me.   Past Medical History:  Diagnosis Date  . Allergic rhinitis, cause unspecified   . Backache, unspecified   . Benign fasciculation-cramp syndrome 12/03/2012    Worsening with  stress, fatigue .  [name] Calculus of kidney   . Conversion disorder   . Cotton wool spots   . Esophageal reflux   . Floater, vitreous, left 05/2016  . GERD (gastroesophageal reflux disease)   . Hemorrhage of rectum and anus   . Internal hemorrhoids without mention of complication   . Irritable bowel syndrome   . Microscopic hematuria   . Migraine headache with aura   . Mild aortic insufficiency   . Mild tricuspid regurgitation   . Osteopenia   . Other plastic surgery for unacceptable cosmetic appearance    Inj tx fllers/expander  . Personal history of urinary calculi   . Postmenopausal atrophic vaginitis   . Premature atrial contractions   . PVC (premature ventricular contraction)    a. Event monitor 2013.  [name] Unspecified constipation   . Vasovagal syncope    Past Surgical History:  Procedure Laterality Date  . BREAST BIOPSY Right   . Cosmetic Procedures w/Injection therapy    . SKIN CANCER EXCISION Left    thigh  . WISDOM TOOTH EXTRACTION     Social History     Socioeconomic History  . Marital status: Married    Spouse name: [name]  . Number of children: 0  . Years of education: 47  . Highest education level: None  Social Needs  . Financial resource strain: None  . Food insecurity - worry: None  . Food insecurity - inability: None  . Transportation needs - medical: None  . Transportation needs - non-medical: None  Occupational History  . Occupation: OFFICE liasion    Employer: Tax inspector  . Occupation: Engineer, structural  . Occupation: OFFICE LEAZON    Employer: [employer]  Tobacco Use  . Smoking status: Never Smoker  . Smokeless tobacco: Never Used  Substance and Sexual Activity  . Alcohol use: No  . Drug use: No  . Sexual activity: None  Other Topics Concern  . 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 Offerred medical services for them if needed (Nov '11)   Patient is married [name]) and lives at home with her husband.   Patient is working Administrator, arts work.   Patient has a high school education.   Patient is right-handed.  Patient does not drink any caffeine.         Allergies  Allergen Reactions  . Codeine Other (See Comments)    Doesn't remeber  . Doxycycline Other (See Comments)  . Epinephrine     Heart racing   . Neomycin     Topical rash from cream  . Penicillins Hives and Other (See Comments)    Has patient had a PCN reaction causing immediate rash, facial/tongue/throat swelling, SOB or lightheadedness with hypotension: Yes Has patient had a PCN reaction causing severe rash involving mucus membranes or skin necrosis: Yes Has patient had a PCN reaction that required hospitalization No Has patient had a PCN reaction occurring within the last 10 years: No If all of the above  answers are "NO", then may proceed with Cephalosporin use.   . Protonix [Pantoprazole Sodium]     ?HAs  . Sulfonamide Derivatives Other (See Comments)    Doesn't remember   . Zithromax [Azithromycin] Rash   Family History  Problem Relation Age of Onset  . Hyperlipidemia Father   . COPD Father        Smoker, deceased 19-Jun-2014  . Hypertension Father   . Hyperlipidemia Mother   . Hypertension Mother   . Diabetes Neg Hx   . Colon cancer Neg Hx   . Breast cancer Neg Hx   . Coronary artery disease Neg Hx   . Cancer Neg Hx        breast or colon     Past medical history, social, surgical and family history all reviewed in electronic medical record.  No pertanent information unless stated regarding to the chief complaint.   Review of Systems:Review of systems updated and as accurate as of 08/13/17  No headache, visual changes, nausea, vomiting, diarrhea, constipation, dizziness, abdominal pain, skin rash, fevers, chills, night sweats, weight loss, swollen lymph nodes, body aches, joint swelling, muscle aches, chest pain, shortness of breath, mood changes.   Objective  Blood pressure 110/66, pulse 75, height 5\' 2"  (1.575 m), weight 102 lb (46.3 kg), SpO2 98 %. Systems examined below as of 08/13/17   General: No apparent distress alert and oriented x3 mood and affect normal, dressed appropriately.  HEENT: Pupils equal, extraocular movements intact  Respiratory: Patient's speak in full sentences and does not appear short of breath  Cardiovascular: No lower extremity edema, non tender, no erythema  Skin: Warm dry intact with no signs of infection or rash on extremities or on axial skeleton.  Abdomen: Soft nontender  Neuro: Cranial nerves II through XII are intact, neurovascularly intact in all extremities with 2+ DTRs and 2+ pulses.  Lymph: No lymphadenopathy of posterior or anterior cervical chain or axillae bilaterally.  Gait normal with good balance and coordination.  MSK:  Non  tender with full range of motion and good stability and symmetric strength and tone of shoulders, elbows, wrist, hip, knee and ankles bilaterally.  Back Exam:  Inspection: Unremarkable  Motion: Flexion 40 deg, Extension 25 deg, Side Bending to 45 deg bilaterally,  Rotation to 45 deg bilaterally  SLR laying: Negative  XSLR laying: Negative  Palpable tenderness: Tender to palpation in the paraspinal musculature. FABER: Positive right. Sensory change: Gross sensation intact to all lumbar and sacral dermatomes.  Reflexes: 2+ at both patellar tendons, 2+ at achilles tendons, Babinski's downgoing.  Strength at foot  Plantar-flexion: 5/5 Dorsi-flexion: 5/5 Eversion: 5/5 Inversion: 5/5  Leg strength  Quad: 5/5 Hamstring: 5/5 Hip flexor: 5/5 Hip abductors: 5/5  Gait unremarkable.  Impression and Recommendations:     This case required medical decision making of moderate complexity.      Note: This dictation was prepared with Dragon dictation along with smaller phrase technology. Any transcriptional errors that result from this process are unintentional.

## 2017-08-13 ENCOUNTER — Encounter: Payer: Self-pay | Admitting: Family Medicine

## 2017-08-13 ENCOUNTER — Other Ambulatory Visit: Payer: Self-pay | Admitting: Internal Medicine

## 2017-08-13 ENCOUNTER — Ambulatory Visit: Payer: BLUE CROSS/BLUE SHIELD | Admitting: Family Medicine

## 2017-08-13 DIAGNOSIS — R768 Other specified abnormal immunological findings in serum: Secondary | ICD-10-CM

## 2017-08-13 DIAGNOSIS — M5136 Other intervertebral disc degeneration, lumbar region: Secondary | ICD-10-CM | POA: Diagnosis not present

## 2017-08-13 MED ORDER — VITAMIN D (ERGOCALCIFEROL) 1.25 MG (50000 UNIT) PO CAPS: 50000.0000 [IU] | ORAL_CAPSULE | ORAL | 0 refills | Status: DC

## 2017-08-13 MED ORDER — RANITIDINE HCL 150 MG PO TABS: 150.0000 mg | ORAL_TABLET | Freq: Two times a day (BID) | ORAL | 3 refills | Status: DC

## 2017-08-13 NOTE — Patient Instructions (Addendum)
Good to see you  You will do great  I will check if we can speed up the appointment  Once weekly vitamin D for 12 weeks.  Turmeric 500mg  1-2 times a day for inflammation  Tart cherry extract any dose at night For now I would try machine weights, biking, elliptical and swimming No free weights jumping or running.  Stay hydrated.  See me again in 3-4 weeks

## 2017-08-13 NOTE — Assessment & Plan Note (Signed)
Patient does have some moderate osteoarthritic changes of the lumbar spine.  Discussed with patient again at great length.  We did discuss that patient likely does have underlying lupus.  Positive ANA is showing this.  Discussed icing regimen and home exercises.  Discussed which activities of doing which wants to avoid.  Follow-up again in 4 weeks.

## 2017-08-13 NOTE — Assessment & Plan Note (Signed)
Likely lupus

## 2017-08-14 ENCOUNTER — Encounter: Payer: Self-pay | Admitting: Family Medicine

## 2017-08-27 NOTE — Progress Notes (Signed)
Chief Complaint  Patient presents with  . Follow-up    aortic insufficiency     History of Present Illness: 67 yo female with history of premature atrial contractions, palpitations, vasovagal syncope, mild aortic valve insufficiency, mild mitral regurgitation, IBS, GERD here today for cardiac follow up. She has been followed in the past by Dr. Verl Blalock. Echo 09/07/13 with with mild AI, mild MR, normal LV function. Exercise stress test 05/07/14 with good exercise tolerance, no ischemic EKG changes and no chest pain with exercise. Most recent echo February 2019 with normal LV function, mild AI.   She is here today for follow up. The patient denies any chest pain, dyspnea, palpitations, lower extremity edema, orthopnea, PND, dizziness, near syncope or syncope. She is exercising every day. Occasional resting chest pain but never with exercise.   Primary Care Physician: Cassandria Anger, MD  Past Medical History:  Diagnosis Date  . Allergic rhinitis, cause unspecified   . Backache, unspecified   . Benign fasciculation-cramp syndrome 12/03/2012    Worsening with  stress, fatigue .  [name] Calculus of kidney   . Conversion disorder   . Cotton wool spots   . Esophageal reflux   . Floater, vitreous, left 05/2016  . GERD (gastroesophageal reflux disease)   . Hemorrhage of rectum and anus   . Internal hemorrhoids without mention of complication   . Irritable bowel syndrome   . Microscopic hematuria   . Migraine headache with aura   . Mild aortic insufficiency   . Mild tricuspid regurgitation   . Osteopenia   . Other plastic surgery for unacceptable cosmetic appearance    Inj tx fllers/expander  . Personal history of urinary calculi   . Postmenopausal atrophic vaginitis   . Premature atrial contractions   . PVC (premature ventricular contraction)    a. Event monitor 2013.  [name] Unspecified constipation   . Vasovagal syncope     Past Surgical History:  Procedure Laterality Date  . BREAST  BIOPSY Right   . Cosmetic Procedures w/Injection therapy    . SKIN CANCER EXCISION Left    thigh  . WISDOM TOOTH EXTRACTION      Current Outpatient Medications  Medication Sig Dispense Refill  . ALPRAZolam (XANAX) 0.25 MG tablet 1 po bid prn anxiety 30 tablet 1  . ALREX 0.2 % SUSP Apply 0.2 drops to eye as needed (allergies).     . Botulinum Toxin Type A, Cosm, (BOTOX COSMETIC) 50 UNITS SOLR Inject 4-6 Units into the muscle. 3 month  injection    . conjugated estrogens (PREMARIN) vaginal cream Use PV once every 2-3 days 90 g 2  . Diclofenac Sodium (PENNSAID) 2 % SOLN Place 2 application onto the skin 2 (two) times daily. 112 g 3  . EPIPEN 2-PAK 0.3 MG/0.3ML SOAJ injection See admin instructions. Reported on 10/08/2015  1  . fexofenadine (ALLEGRA ALLERGY CHILDRENS) 30 MG tablet Take 30 mg by mouth 2 (two) times daily as needed (allergies).    . fluocinonide cream (LIDEX) 2.70 % Apply 1 application topically 2 (two) times daily. 90 g 2  . fluticasone (FLONASE) 50 MCG/ACT nasal spray Place 1 spray into both nostrils as needed for allergies.   5  . Hyaluronic Acid (RESTYLANE IJ) Inject as directed every 6 (six) months.    . meclizine (ANTIVERT) 25 MG tablet Take 1 tablet (25 mg total) by mouth 3 (three) times daily as needed for dizziness. 30 tablet 0  . omeprazole (PRILOSEC) 40 MG capsule  Take 40 mg by mouth daily.    [name] tretinoin (RETIN-A) 0.05 % cream Apply 1 application topically at bedtime.    . triamcinolone cream (KENALOG) 0.1 % APPLY TO AFFECTED AREA 3 TIMES A DAY 30 g 0   No current facility-administered medications for this visit.     Allergies  Allergen Reactions  . Codeine Other (See Comments)    Doesn't remeber  . Doxycycline Other (See Comments)  . Epinephrine     Heart racing   . Neomycin     Topical rash from cream  . Penicillins Hives and Other (See Comments)    Has patient had a PCN reaction causing immediate rash, facial/tongue/throat swelling, SOB or lightheadedness  with hypotension: Yes Has patient had a PCN reaction causing severe rash involving mucus membranes or skin necrosis: Yes Has patient had a PCN reaction that required hospitalization No Has patient had a PCN reaction occurring within the last 10 years: No If all of the above answers are "NO", then may proceed with Cephalosporin use.   . Protonix [Pantoprazole Sodium]     ?HAs  . Sulfonamide Derivatives Other (See Comments)    Doesn't remember   . Zithromax [Azithromycin] Rash    Social History   Socioeconomic History  . Marital status: Married    Spouse name: [name]  . Number of children: 0  . Years of education: 58  . Highest education level: Not on file  Social Needs  . Financial resource strain: Not on file  . Food insecurity - worry: Not on file  . Food insecurity - inability: Not on file  . Transportation needs - medical: Not on file  . Transportation needs - non-medical: Not on file  Occupational History  . Occupation: OFFICE liasion    Employer: [employer]  . Occupation: Engineer, structural  . Occupation: OFFICE LEAZON    Employer: [employer]  Tobacco Use  . Smoking status: Never Smoker  . Smokeless tobacco: Never Used  Substance and Sexual Activity  . Alcohol use: No  . Drug use: No  . Sexual activity: Not on file  Other Topics Concern  . Not on file  Social History Narrative   [employer]  in [place]. Married '90 - marriage is in very good shape '12, No children.  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 Offerred medical services for them if needed (Nov '11)   Patient is married [name]) and lives at home with her husband.   Patient is working Administrator, arts work.   Patient has a high school education.   Patient is right-handed.   Patient does not drink any caffeine.          Family History  Problem Relation Age of Onset  . Hyperlipidemia Father     . COPD Father        Smoker, deceased 07-09-2014  . Hypertension Father   . Hyperlipidemia Mother   . Hypertension Mother   . Diabetes Neg Hx   . Colon cancer Neg Hx   . Breast cancer Neg Hx   . Coronary artery disease Neg Hx   . Cancer Neg Hx        breast or colon    Review of Systems:  As stated in the HPI and otherwise negative.   BP 114/74   Pulse 77   Ht 5\' 2"  (1.575 m)   Wt 102 lb (  46.3 kg)   SpO2 99%   BMI 18.66 kg/m   Physical Examination:  General: Well developed, well nourished, NAD  HEENT: OP clear, mucus membranes moist  SKIN: warm, dry. No rashes. Neuro: No focal deficits  Musculoskeletal: Muscle strength 5/5 all ext  Psychiatric: Mood and affect normal  Neck: No JVD, no carotid bruits, no thyromegaly, no lymphadenopathy.  Lungs:Clear bilaterally, no wheezes, rhonci, crackles Cardiovascular: Regular rate and rhythm. No murmurs, gallops or rubs. Abdomen:Soft. Bowel sounds present. Non-tender.  Extremities: No lower extremity edema. Pulses are 2 + in the bilateral DP/PT.  Echo 08/12/17: - Left ventricle: The cavity size was normal. Wall thickness was   normal. Systolic function was normal. The estimated ejection   fraction was in the range of 60% to 65%. Wall motion was normal;   there were no regional wall motion abnormalities. Left   ventricular diastolic function parameters were normal. - Aortic valve: There was mild regurgitation.  EKG:  EKG is not ordered today. The ekg ordered today demonstrates   Recent Labs: 09/12/2016: ALT 11; TSH 1.52 12/25/2016: Hemoglobin 12.8; Platelets 201.0 01/31/2017: BUN 19; Creatinine, Ser 0.83; Potassium 4.8; Sodium 139   Lipid Panel    Component Value Date/Time   CHOL 219 (H) 01/31/2017 0952   TRIG 78.0 01/31/2017 0952   TRIG 66 06/25/2006 0741   HDL 74.60 01/31/2017 0952   CHOLHDL 3 01/31/2017 0952   VLDL 15.6 01/31/2017 0952   LDLCALC 128 (H) 01/31/2017 0952     Wt Readings from Last 3 Encounters:   08/28/17 102 lb (46.3 kg)  08/13/17 102 lb (46.3 kg)  07/26/17 102 lb 4 oz (46.4 kg)     Other studies Reviewed: Additional studies/ records that were reviewed today include:  Review of the above records demonstrates:    Assessment and Plan:   1. Palpitations: She is known to have PACs/PVCs. Event monitor 2013 with PVCs. Rare palpitations. Avoid stimulants.    2. Aortic valve insufficiency: Mild by echo February 2019. Will repeat echo in February 2021  3. Carotid artery disease: Mild disease by u/s August 2017. Repeat dopplers August 2020.    Current medicines are reviewed at length with the patient today.  The patient does not have concerns regarding medicines.  The following changes have been made:  no change  Labs/ tests ordered today include:   No orders of the defined types were placed in this encounter.    Disposition:   FU with me in 12  months   Signed, Lauree Chandler, MD 08/28/2017 9:23 AM    [address], [place], [place]  [address] Phone: [phone]; Fax: [phone]

## 2017-08-28 ENCOUNTER — Encounter: Payer: Self-pay | Admitting: *Deleted

## 2017-08-28 ENCOUNTER — Ambulatory Visit (INDEPENDENT_AMBULATORY_CARE_PROVIDER_SITE_OTHER): Payer: BLUE CROSS/BLUE SHIELD | Admitting: Cardiovascular Disease

## 2017-08-28 ENCOUNTER — Encounter: Payer: Self-pay | Admitting: Cardiovascular Disease

## 2017-08-28 VITALS — BP 114/74 | HR 77 | Ht 62.0 in | Wt 102.0 lb

## 2017-08-28 DIAGNOSIS — I6523 Occlusion and stenosis of bilateral carotid arteries: Secondary | ICD-10-CM | POA: Diagnosis not present

## 2017-08-28 DIAGNOSIS — I351 Nonrheumatic aortic (valve) insufficiency: Secondary | ICD-10-CM | POA: Diagnosis not present

## 2017-08-28 DIAGNOSIS — I491 Atrial premature depolarization: Secondary | ICD-10-CM | POA: Diagnosis not present

## 2017-08-28 NOTE — Patient Instructions (Signed)

## 2017-09-05 ENCOUNTER — Encounter: Payer: Self-pay | Admitting: Internal Medicine

## 2017-09-12 NOTE — Progress Notes (Signed)
[name] [address] [place], [place] [address] Phone: [phone] Subjective:     CC: Back pain follow-up  WGN:[id number]  [name] is a 67 y.o. female coming in with complaint of back pain.  Patient has the possibility of a diagnosis of lupus and is going to rheumatology in April.  Was having neck and back pain.  Did not want to do manipulation. Patient has been doing more conservative therapy.  Patient states that her back pain has improved. She has not been lifting weights but has done some machines and cardio.  Patient wants to get back to lifting.  Patient actually did see the rheumatologist.  Patient was told that she did not have lupus even though she has a very high ANA titer.   X-rays of the lumbar spine show degenerative changes moderate in nature.  These were independently visualized by me.  Past Medical History:  Diagnosis Date  . Allergic rhinitis, cause unspecified   . Backache, unspecified   . Benign fasciculation-cramp syndrome 12/03/2012    Worsening with  stress, fatigue .  [name] Calculus of kidney   . Conversion disorder   . Cotton wool spots   . Esophageal reflux   . Floater, vitreous, left 05/2016  . GERD (gastroesophageal reflux disease)   . Hemorrhage of rectum and anus   . Internal hemorrhoids without mention of complication   . Irritable bowel syndrome   . Microscopic hematuria   . Migraine headache with aura   . Mild aortic insufficiency   . Mild tricuspid regurgitation   . Osteopenia   . Other plastic surgery for unacceptable cosmetic appearance    Inj tx fllers/expander  . Personal history of urinary calculi   . Postmenopausal atrophic vaginitis   . Premature atrial contractions   . PVC (premature ventricular contraction)    a. Event monitor 2013.  [name] Unspecified constipation   . Vasovagal syncope    Past Surgical History:  Procedure Laterality Date  . BREAST BIOPSY Right   . Cosmetic Procedures  w/Injection therapy    . SKIN CANCER EXCISION Left    thigh  . WISDOM TOOTH EXTRACTION     Social History   Socioeconomic History  . Marital status: Married    Spouse name: [name]  . Number of children: 0  . Years of education: 107  . Highest education level: Not on file  Occupational History  . Occupation: OFFICE liasion    Employer: [employer]  . Occupation: Engineer, structural  . 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 Financial resource strain: Not on file  . Food insecurity:    Worry: Not on file    Inability: Not on file  . Transportation needs:    Medical: Not on file    Non-medical: Not on file  Tobacco Use  . Smoking status: Never Smoker  . Smokeless tobacco: Never Used  Substance and Sexual Activity  . Alcohol use: No  . Drug use: No  . Sexual activity: Not on file  Lifestyle  . Physical activity:    Days per week: Not on file    Minutes per session: Not on file  . Stress: Not on file  Relationships  . Social connections:    Talks on phone: Not on file    Gets together: Not on file    Attends religious service: Not on file    Active member of club  or organization: Not on file    Attends meetings of clubs or organizations: Not on file    Relationship status: Not on file  Other Topics Concern  . Not on file  Social History Narrative   [employer]  in [place]. Married '90 - marriage is in very good shape '12, No children.  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 Offerred medical services for them if needed (Nov '11)   Patient is married [name]) and lives at home with her husband.   Patient is working Administrator, arts work.   Patient has a high school education.   Patient is right-handed.   Patient does not drink any caffeine.         Allergies  Allergen Reactions  . Codeine Other (See Comments)    Doesn't remeber    . Doxycycline Other (See Comments)  . Epinephrine     Heart racing   . Neomycin     Topical rash from cream  . Penicillins Hives and Other (See Comments)    Has patient had a PCN reaction causing immediate rash, facial/tongue/throat swelling, SOB or lightheadedness with hypotension: Yes Has patient had a PCN reaction causing severe rash involving mucus membranes or skin necrosis: Yes Has patient had a PCN reaction that required hospitalization No Has patient had a PCN reaction occurring within the last 10 years: No If all of the above answers are "NO", then may proceed with Cephalosporin use.   . Protonix [Pantoprazole Sodium]     ?HAs  . Sulfonamide Derivatives Other (See Comments)    Doesn't remember   . Zithromax [Azithromycin] Rash   Family History  Problem Relation Age of Onset  . Hyperlipidemia Father   . COPD Father        Smoker, deceased 07/14/2014  . Hypertension Father   . Hyperlipidemia Mother   . Hypertension Mother   . Diabetes Neg Hx   . Colon cancer Neg Hx   . Breast cancer Neg Hx   . Coronary artery disease Neg Hx   . Cancer Neg Hx        breast or colon     Past medical history, social, surgical and family history all reviewed in electronic medical record.  No pertanent information unless stated regarding to the chief complaint.   Review of Systems:Review of systems updated and as accurate as of 09/13/17  No headache, visual changes, nausea, vomiting, diarrhea, constipation, dizziness, abdominal pain, skin rash, fevers, chills, night sweats, weight loss, swollen lymph nodes, body aches, joint swelling, , chest pain, shortness of breath, mood changes.  Positive muscle aches  Objective  Blood pressure 110/72, pulse 66, height 5\' 2"  (1.575 m), weight 101 lb (45.8 kg), SpO2 98 %. Systems examined below as of 09/13/17   General: No apparent distress alert and oriented x3 mood and affect normal, dressed appropriately.  HEENT: Pupils equal, extraocular movements  intact  Respiratory: Patient's speak in full sentences and does not appear short of breath  Cardiovascular: No lower extremity edema, non tender, no erythema  Skin: Warm dry intact with no signs of infection or rash on extremities or on axial skeleton.  Abdomen: Soft nontender  Neuro: Cranial nerves II through XII are intact, neurovascularly intact in all extremities with 2+ DTRs and 2+ pulses.  Lymph: No lymphadenopathy of posterior or anterior cervical chain or axillae bilaterally.  Gait normal with good balance and coordination.  MSK:  Non tender with full range of motion and good stability and symmetric strength and tone of shoulders, elbows, wrist, hip, knee and ankles bilaterally.  Very minimal arthritic changes.  Back Exam:  Inspection: Mild loss of lordosis very mild scoliosis Motion: Flexion 45 deg, Extension 35 deg, Side Bending to 35 deg bilaterally,  Rotation to 45 deg bilaterally  SLR laying: Negative  XSLR laying: Negative  Palpable tenderness: Tender to palpation in the paraspinal musculature lumbar spine left greater than right.  L2-L3 area. FABER: Tightness bilaterally. Sensory change: Gross sensation intact to all lumbar and sacral dermatomes.  Reflexes: 2+ at both patellar tendons, 2+ at achilles tendons, Babinski's downgoing.  Strength at foot  Plantar-flexion: 5/5 Dorsi-flexion: 5/5 Eversion: 5/5 Inversion: 5/5  Leg strength  Quad: 5/5 Hamstring: 5/5 Hip flexor: 5/5 Hip abductors: 4/5 but symmetric Gait unremarkable.        Impression and Recommendations:     This case required medical decision making of moderate complexity.      Note: This dictation was prepared with Dragon dictation along with smaller phrase technology. Any transcriptional errors that result from this process are unintentional.

## 2017-09-13 ENCOUNTER — Ambulatory Visit (INDEPENDENT_AMBULATORY_CARE_PROVIDER_SITE_OTHER)
Admission: RE | Admit: 2017-09-13 | Discharge: 2017-09-13 | Disposition: A | Discharge status: Home / Self Care | Payer: BLUE CROSS/BLUE SHIELD | Source: Ambulatory Visit | Attending: Family Medicine | Admitting: Family Medicine

## 2017-09-13 ENCOUNTER — Ambulatory Visit (INDEPENDENT_AMBULATORY_CARE_PROVIDER_SITE_OTHER): Payer: BLUE CROSS/BLUE SHIELD | Admitting: Family Medicine

## 2017-09-13 ENCOUNTER — Encounter: Payer: Self-pay | Admitting: Family Medicine

## 2017-09-13 VITALS — BP 110/72 | HR 66 | Ht 62.0 in | Wt 101.0 lb

## 2017-09-13 DIAGNOSIS — M25552 Pain in left hip: Secondary | ICD-10-CM

## 2017-09-13 DIAGNOSIS — R768 Other specified abnormal immunological findings in serum: Secondary | ICD-10-CM

## 2017-09-13 DIAGNOSIS — M5136 Other intervertebral disc degeneration, lumbar region: Secondary | ICD-10-CM

## 2017-09-13 NOTE — Assessment & Plan Note (Signed)
Known degenerative disc disease.  I do believe the patient's radiation around the left side is likely contributing to possibly nerve instead.  We discussed further workup including an MRI which patient declined at this time.  Patient will start increasing exercising on a more regular routine.  Patient will slowly get back to her regular routine.  Discussed no changes in medications at this time.  Follow-up with me again in 6 weeks

## 2017-09-13 NOTE — Assessment & Plan Note (Signed)
We will send patient for a second opinion with her not feeling comfortable with the information she received from the rheumatologist.

## 2017-09-13 NOTE — Patient Instructions (Signed)
Good to see you  Ice is your friend OK to lift 1 time a week with free weights at 50%  Then each week either add 25% weight or 1 more time frequency not both  2-3 week break from damien  Rheumatology should call you  Hip xray downstairs See me again in 6 weeks

## 2017-09-17 ENCOUNTER — Ambulatory Visit (INDEPENDENT_AMBULATORY_CARE_PROVIDER_SITE_OTHER): Payer: BLUE CROSS/BLUE SHIELD | Admitting: Internal Medicine

## 2017-09-17 ENCOUNTER — Encounter: Payer: Self-pay | Admitting: Internal Medicine

## 2017-09-17 VITALS — BP 120/80 | HR 76 | Ht 61.5 in | Wt 102.0 lb

## 2017-09-17 DIAGNOSIS — K581 Irritable bowel syndrome with constipation: Secondary | ICD-10-CM

## 2017-09-17 DIAGNOSIS — K648 Other hemorrhoids: Secondary | ICD-10-CM

## 2017-09-17 NOTE — Progress Notes (Signed)
HISTORY OF PRESENT ILLNESS:  [name] is a 67 y.o. female with health related anxiety who has been followed in this office for constipation predominant irritable bowel syndrome, symptomatic internal hemorrhoids, and functional GI complaints. She was last seen in this office 10/16/2016 regarding globus sensation and constipation predominant irritable bowel syndrome. She presents today inquiring about hemorrhoidal banding. She tells me that her sister, who lives in [place], recently underwent in office hemorrhoidal banding on 3 separate sessions. Patient is known to have internal hemorrhoids which are intermittently symptomatic. Small hemorrhoids initially diagnosed on colonoscopy 2005. Moderate hemorrhoids identified on colonoscopy 2014. The examination was otherwise normal. She tells me that with loose bowels she will notice that her hemorrhoids may protrude and are erythematous. Somewhat uncomfortable. She has used various topical agents in the past with success. She has not had bleeding since her last office visit. She does tell me that she was asked to schedule this appointment to discuss the prospects of hemorrhoidal banding, which is performed by my partners. She also has questions today regarding her interval bowel syndrome and the effects of diet  REVIEW OF SYSTEMS:  All non-GI ROS negative unless otherwise stated in the history of present illness except for allergies, dry skin, migraine  Past Medical History:  Diagnosis Date  . Allergic rhinitis, cause unspecified   . Backache, unspecified   . Benign fasciculation-cramp syndrome 12/03/2012    Worsening with  stress, fatigue .  [name] Calculus of kidney   . Conversion disorder   . Cotton wool spots   . Esophageal reflux   . Floater, vitreous, left 05/2016  . GERD (gastroesophageal reflux disease)   . Hemorrhage of rectum and anus   . Internal hemorrhoids without mention of complication   . Irritable bowel syndrome   . Microscopic  hematuria   . Migraine headache with aura   . Mild aortic insufficiency   . Mild tricuspid regurgitation   . Osteopenia   . Other plastic surgery for unacceptable cosmetic appearance    Inj tx fllers/expander  . Personal history of urinary calculi   . Postmenopausal atrophic vaginitis   . Premature atrial contractions   . PVC (premature ventricular contraction)    a. Event monitor 2013.  [name] Unspecified constipation   . Vasovagal syncope     Past Surgical History:  Procedure Laterality Date  . BREAST BIOPSY Right   . Cosmetic Procedures w/Injection therapy    . SKIN CANCER EXCISION Left    thigh  . WISDOM TOOTH EXTRACTION      Social History Gyneth Hubka  reports that she has never smoked. She has never used smokeless tobacco. She reports that she does not drink alcohol or use drugs.  family history includes COPD in her father; Hyperlipidemia in her father and mother; Hypertension in her father and mother.  Allergies  Allergen Reactions  . Codeine Other (See Comments)    Doesn't remeber  . Doxycycline Other (See Comments)  . Epinephrine     Heart racing   . Neomycin     Topical rash from cream  . Penicillins Hives and Other (See Comments)    Has patient had a PCN reaction causing immediate rash, facial/tongue/throat swelling, SOB or lightheadedness with hypotension: Yes Has patient had a PCN reaction causing severe rash involving mucus membranes or skin necrosis: Yes Has patient had a PCN reaction that required hospitalization No Has patient had a PCN reaction occurring within the last 10 years: No If all of the above  answers are "NO", then may proceed with Cephalosporin use.   . Protonix [Pantoprazole Sodium]     ?HAs  . Sulfonamide Derivatives Other (See Comments)    Doesn't remember   . Zithromax [Azithromycin] Rash       PHYSICAL EXAMINATION: Vital signs: BP 120/80 (BP Location: Left Arm, Patient Position: Sitting, Cuff Size: Normal)   Pulse 76   Ht  5' 1.5" (1.562 m) Comment: height measured without shoes  Wt 102 lb (46.3 kg)   BMI 18.96 kg/m   Constitutional: generally well-appearing, no acute distress Psychiatric: alert and oriented x3, cooperative Eyes:  anicteric,  Rectal: not reexamined Neuro: No gross deficits  ASSESSMENT:  #1. Intermittent symptomatic internal hemorrhoids #2. Normal colonoscopy 2005 and 2014 except for internal hemorrhoids #3. Irritable bowel syndrome #4. Health related anxiety   PLAN:  #1. Discussed in office hemorrhoid banding procedure. She had multiple questions. Brochure provided. Encouraged to watch educational video. She will contact the office if she wishes to proceed with an office hemorrhoidal banding. I would refer her to my partner Dr. Zenovia Jarred #2. Discussed irritable bowel syndrome #3. Repeat routine screening colonoscopy around 2024. Interval follow-up as needed  10 minutes spent face-to-face with the patient. The entire time use for counseling regarding management of symptomatic internal hemorrhoids and answering questions regarding irritable bowel

## 2017-09-17 NOTE — Patient Instructions (Signed)
Call the office if you would like to schedule a hemorrhoid banding

## 2017-09-23 ENCOUNTER — Ambulatory Visit: Payer: BLUE CROSS/BLUE SHIELD | Admitting: Internal Medicine

## 2017-09-23 ENCOUNTER — Encounter: Payer: Self-pay | Admitting: Internal Medicine

## 2017-09-23 DIAGNOSIS — R768 Other specified abnormal immunological findings in serum: Secondary | ICD-10-CM

## 2017-09-23 DIAGNOSIS — R1032 Left lower quadrant pain: Secondary | ICD-10-CM

## 2017-09-23 NOTE — Assessment & Plan Note (Signed)
Pelvic vag [place]

## 2017-09-23 NOTE — Progress Notes (Signed)
Subjective:  Patient ID: [name], female    DOB: 1950/07/16  Age: 67 y.o. MRN: [id number]  CC: No chief complaint on file.   HPI [name] presents for elevated ANA, LBP since Jan 2019 C/o pain in the L side - constant and squeezing pain. Dr Trudie Reed ref the pt to "[facility]"  C/o LLQ pain, L groin pain since Feb 2019  Outpatient Medications Prior to Visit  Medication Sig Dispense Refill  . ALPRAZolam (XANAX) 0.25 MG tablet 1 po bid prn anxiety 30 tablet 1  . ALREX 0.2 % SUSP Apply 0.2 drops to eye as needed (allergies).     . Botulinum Toxin Type A, Cosm, (BOTOX COSMETIC) 50 UNITS SOLR Inject 4-6 Units into the muscle. 3 month  injection    . conjugated estrogens (PREMARIN) vaginal cream Use PV once every 2-3 days 90 g 2  . Diclofenac Sodium (PENNSAID) 2 % SOLN Place 2 application onto the skin 2 (two) times daily. 112 g 3  . EPIPEN 2-PAK 0.3 MG/0.3ML SOAJ injection See admin instructions. Reported on 10/08/2015  1  . fexofenadine (ALLEGRA ALLERGY CHILDRENS) 30 MG tablet Take 30 mg by mouth 2 (two) times daily as needed (allergies).    . fluocinonide cream (LIDEX) 2.83 % Apply 1 application topically 2 (two) times daily. 90 g 2  . fluticasone (FLONASE) 50 MCG/ACT nasal spray Place 1 spray into both nostrils as needed for allergies.   5  . Hyaluronic Acid (RESTYLANE IJ) Inject as directed every 6 (six) months.    . meclizine (ANTIVERT) 25 MG tablet Take 1 tablet (25 mg total) by mouth 3 (three) times daily as needed for dizziness. 30 tablet 0  . omeprazole (PRILOSEC) 40 MG capsule Take 40 mg by mouth daily.    [name] tretinoin (RETIN-A) 0.05 % cream Apply 1 application topically at bedtime.    . triamcinolone cream (KENALOG) 0.1 % APPLY TO AFFECTED AREA 3 TIMES A DAY 30 g 0   No facility-administered medications prior to visit.     ROS Review of Systems  Constitutional: Negative for activity change, appetite change, chills, fatigue and unexpected weight  change.  HENT: Negative for congestion, mouth sores and sinus pressure.   Eyes: Negative for visual disturbance.  Respiratory: Negative for cough and chest tightness.   Gastrointestinal: Negative for abdominal pain and nausea.  Genitourinary: Negative for difficulty urinating, frequency and vaginal pain.  Musculoskeletal: Positive for arthralgias and back pain. Negative for gait problem.  Skin: Negative for pallor and rash.  Neurological: Negative for dizziness, tremors, weakness, numbness and headaches.  Psychiatric/Behavioral: Negative for confusion and sleep disturbance.    Objective:  BP 110/78   Pulse 74   Temp 98.6 F (37 C)   Wt 102 lb (46.3 kg)   SpO2 99%   BMI 18.96 kg/m   BP Readings from Last 3 Encounters:  09/23/17 110/78  09/17/17 120/80  09/13/17 110/72    Wt Readings from Last 3 Encounters:  09/23/17 102 lb (46.3 kg)  09/17/17 102 lb (46.3 kg)  09/13/17 101 lb (45.8 kg)    Physical Exam  Constitutional: She appears well-developed. No distress.  HENT:  Head: Normocephalic.  Right Ear: External ear normal.  Left Ear: External ear normal.  Nose: Nose normal.  Mouth/Throat: Oropharynx is clear and moist.  Eyes: Pupils are equal, round, and reactive to light. Conjunctivae are normal. Right eye exhibits no discharge. Left eye exhibits no discharge.  Neck: Normal [name] of motion. Neck supple.  No JVD present. No tracheal deviation present. No thyromegaly present.  Cardiovascular: Normal rate, regular rhythm and normal heart sounds.  Pulmonary/Chest: No stridor. No respiratory distress. She has no wheezes.  Abdominal: Soft. Bowel sounds are normal. She exhibits no distension and no mass. There is tenderness. There is no rebound and no guarding.  Musculoskeletal: She exhibits no edema or tenderness.  Lymphadenopathy:    She has no cervical adenopathy.  Neurological: She displays normal reflexes. No cranial nerve deficit. She exhibits normal muscle tone.  Coordination normal.  Skin: No rash noted. No erythema.  Psychiatric: She has a normal mood and affect. Her behavior is normal. Judgment and thought content normal.    Lab Results  Component Value Date   WBC 8.0 12/25/2016   HGB 12.8 12/25/2016   HCT 38.0 12/25/2016   PLT 201.0 12/25/2016   GLUCOSE 92 01/31/2017   CHOL 219 (H) 01/31/2017   TRIG 78.0 01/31/2017   HDL 74.60 01/31/2017   LDLCALC 128 (H) 01/31/2017   ALT 11 09/12/2016   AST 14 09/12/2016   NA 139 01/31/2017   K 4.8 01/31/2017   CL 105 01/31/2017   CREATININE 0.83 01/31/2017   BUN 19 01/31/2017   CO2 28 01/31/2017   TSH 1.52 09/12/2016    Dg Hip Unilat With Pelvis 2-3 Views Left  Result Date: 09/13/2017 CLINICAL DATA:  Left hip and groin pain.  No known injury. EXAM: DG HIP (WITH OR WITHOUT PELVIS) 2-3V LEFT COMPARISON:  Lumbar spine 08/08/2017. FINDINGS: Mild degenerative changes lumbar spine left hip. Diffuse osteopenia. No acute bony or joint abnormality. Calcific density noted over the left hip may represent small injection granuloma. IMPRESSION: Mild degenerative changes lumbar spine and left hip. No acute abnormality. Electronically Signed   By: Marcello Moores  Register   On: 09/13/2017 14:08    Assessment & Plan:   There are no diagnoses linked to this encounter. I am having [name] maintain her Botulinum Toxin Type A (Cosm), ALREX, Hyaluronic Acid (RESTYLANE IJ), fluticasone, tretinoin, fluocinonide cream, EPIPEN 2-PAK, fexofenadine, meclizine, Diclofenac Sodium, ALPRAZolam, conjugated estrogens, triamcinolone cream, and omeprazole.  No orders of the defined types were placed in this encounter.    Follow-up: No follow-ups on file.  [name], MD

## 2017-09-23 NOTE — Assessment & Plan Note (Addendum)
Dr Trudie Reed - false (+) test  Dr Trudie Reed ref the pt to "[facility]"

## 2017-09-25 ENCOUNTER — Telehealth: Payer: Self-pay | Admitting: Internal Medicine

## 2017-09-25 DIAGNOSIS — R300 Dysuria: Secondary | ICD-10-CM

## 2017-09-25 NOTE — Telephone Encounter (Signed)
Copied from [place] [phone] [phone]. Topic: Inquiry >> Sep 25, 2017  8:08 AM [name] wrote: Reason for CRM: pt called in to check status of [place] as she has not heard from anyone for scheduling. She is asking in the meantime if she can come in the office for a urinalysis. She believes she has a UTI based on an OTC at home test kit. Please call pt regarding both.

## 2017-09-25 NOTE — Addendum Note (Signed)
Addended by: Karren Cobble on: 09/25/2017 10:12 AM   Modules accepted: Orders

## 2017-09-26 NOTE — Telephone Encounter (Signed)
Pt notified orders put in, she also got number to call to schedule [place]

## 2017-09-26 NOTE — Telephone Encounter (Signed)
Pt calling back this morning checking on status.

## 2017-09-27 ENCOUNTER — Telehealth: Payer: Self-pay | Admitting: Internal Medicine

## 2017-09-27 ENCOUNTER — Other Ambulatory Visit (INDEPENDENT_AMBULATORY_CARE_PROVIDER_SITE_OTHER): Payer: BLUE CROSS/BLUE SHIELD

## 2017-09-27 DIAGNOSIS — R300 Dysuria: Secondary | ICD-10-CM

## 2017-09-27 LAB — URINALYSIS, ROUTINE W REFLEX MICROSCOPIC
Bilirubin Urine: NEGATIVE
KETONES UR: NEGATIVE
LEUKOCYTES UA: NEGATIVE
Nitrite: NEGATIVE
PH: 5.5 (ref 5.0–8.0)
SPECIFIC GRAVITY, URINE: 1.02 (ref 1.000–1.030)
Total Protein, Urine: NEGATIVE
URINE GLUCOSE: NEGATIVE
UROBILINOGEN UA: 0.2 (ref 0.0–1.0)

## 2017-09-27 NOTE — Telephone Encounter (Signed)
Copied from [place]. Topic: Quick Communication - See Telephone Encounter >> Sep 27, 2017  3:48 PM Bea Graff, NT wrote: CRM for notification. See Telephone encounter for: 09/27/17. Pt would like a call with her urinalysis results.

## 2017-09-28 LAB — URINE CULTURE
MICRO NUMBER:: 90453459
SPECIMEN QUALITY:: ADEQUATE

## 2017-09-30 ENCOUNTER — Encounter (INDEPENDENT_AMBULATORY_CARE_PROVIDER_SITE_OTHER): Payer: Self-pay

## 2017-09-30 ENCOUNTER — Ambulatory Visit (HOSPITAL_COMMUNITY)
Admission: RE | Admit: 2017-09-30 | Discharge: 2017-09-30 | Disposition: A | Discharge status: Home / Self Care | Payer: BLUE CROSS/BLUE SHIELD | Source: Ambulatory Visit | Attending: Internal Medicine | Admitting: Internal Medicine

## 2017-09-30 DIAGNOSIS — R1032 Left lower quadrant pain: Secondary | ICD-10-CM | POA: Diagnosis present

## 2017-10-03 NOTE — Telephone Encounter (Signed)
Pt notified through mychart.

## 2017-10-16 DIAGNOSIS — H5315 Visual distortions of shape and size: Secondary | ICD-10-CM | POA: Diagnosis not present

## 2017-10-16 DIAGNOSIS — H43393 Other vitreous opacities, bilateral: Secondary | ICD-10-CM | POA: Diagnosis not present

## 2017-10-16 DIAGNOSIS — H43812 Vitreous degeneration, left eye: Secondary | ICD-10-CM | POA: Diagnosis not present

## 2017-10-16 DIAGNOSIS — H2513 Age-related nuclear cataract, bilateral: Secondary | ICD-10-CM | POA: Diagnosis not present

## 2017-10-19 IMAGING — DX DG CHEST 2V
2 series · 2 of 2 positions shown · non-contrast
Comparison: 08/12/2012.

CLINICAL DATA: Intermittent vibration in the chest for 2 weeks.
Palpitations.

EXAM:
CHEST  2 VIEW

[chest pa]
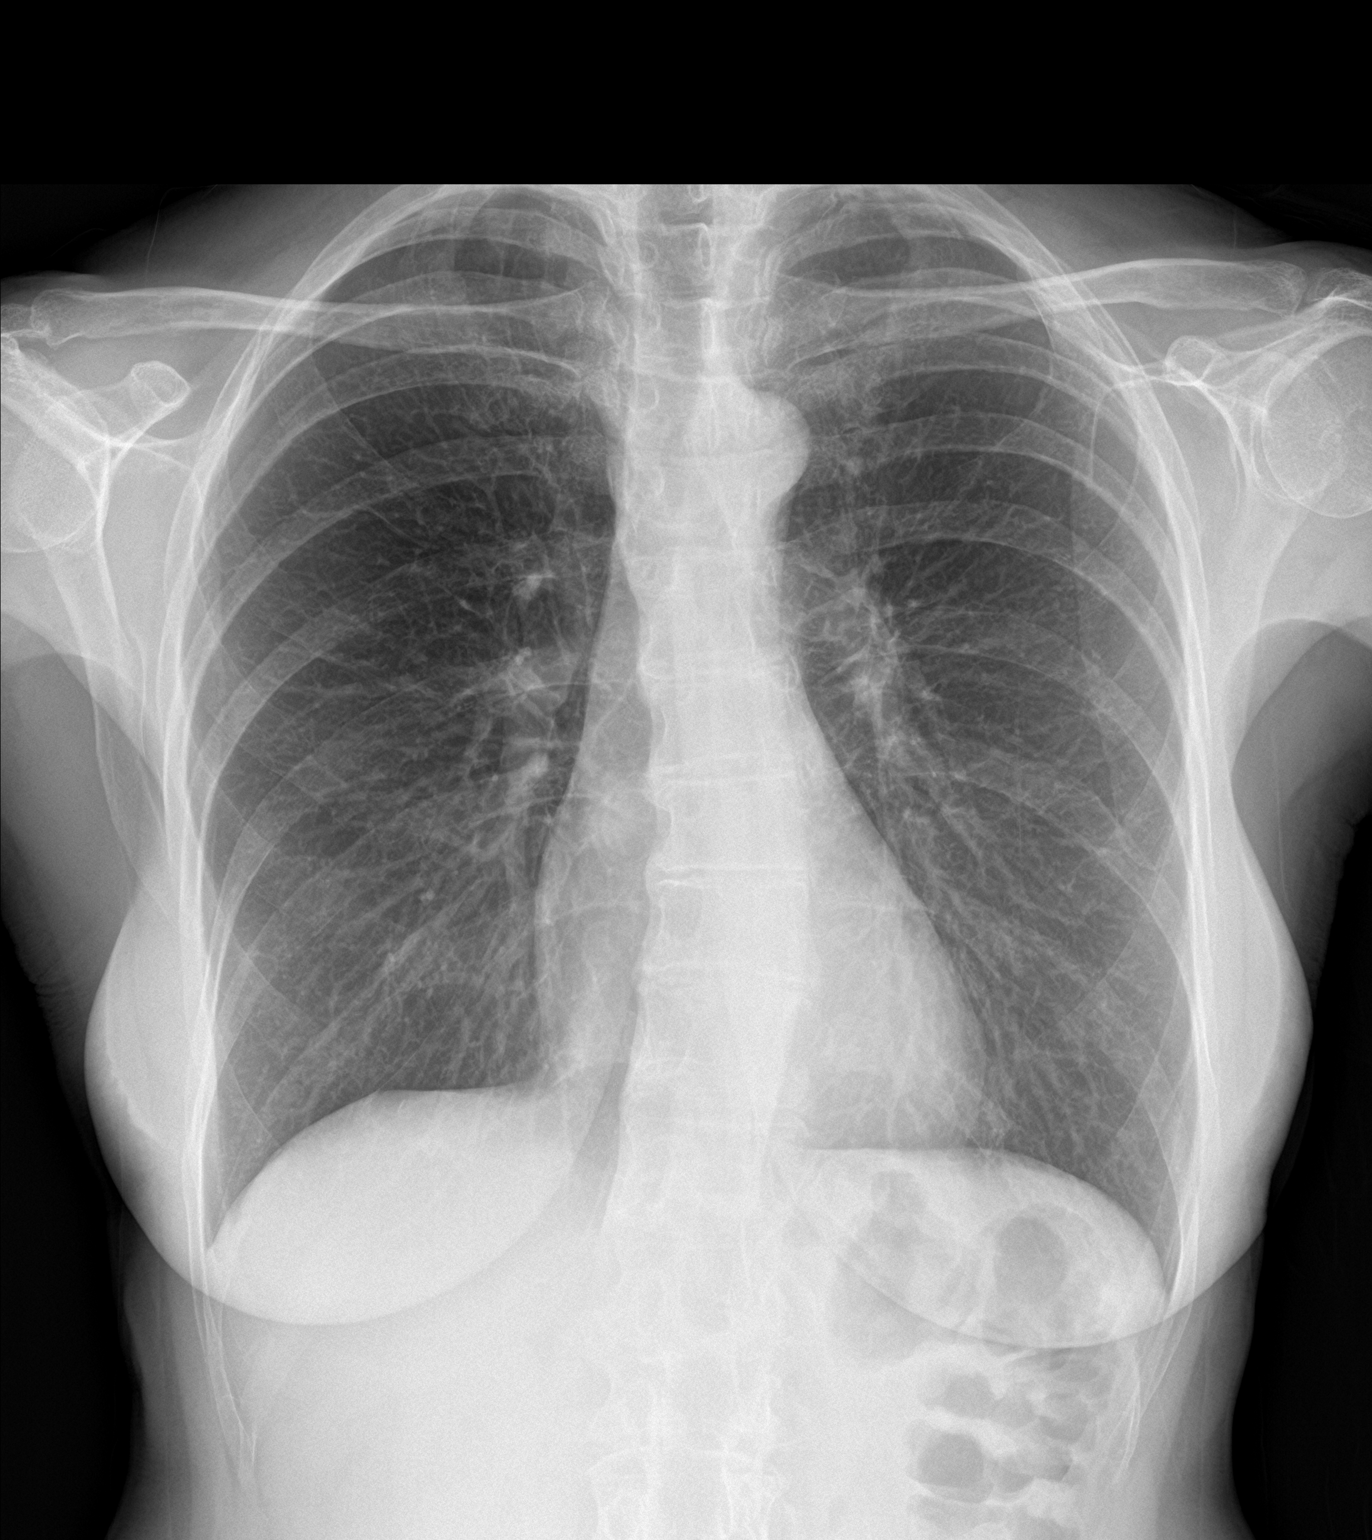

[chest lat]
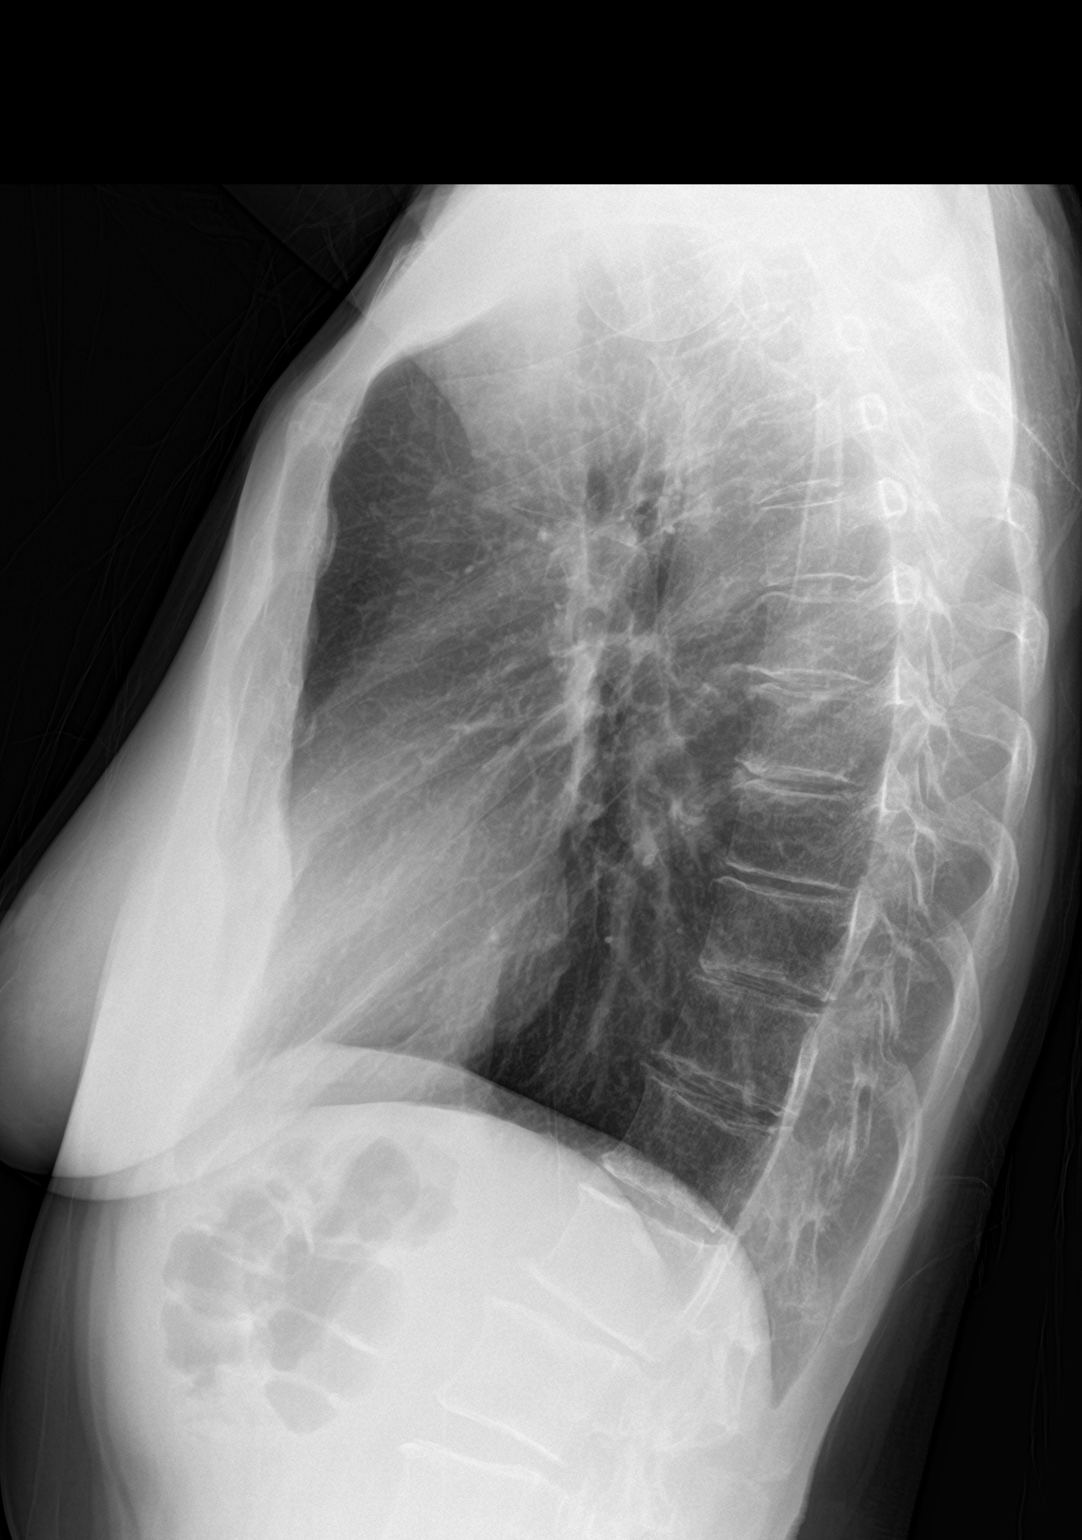

[2 of 2 positions shown; findings below may reference images not displayed]

FINDINGS: The heart size and mediastinal contours are within normal limits.
Both lungs are clear. The visualized skeletal structures are
unremarkable.
IMPRESSION: No active cardiopulmonary disease.

## 2017-10-21 DIAGNOSIS — H6123 Impacted cerumen, bilateral: Secondary | ICD-10-CM | POA: Insufficient documentation

## 2017-10-21 DIAGNOSIS — J3489 Other specified disorders of nose and nasal sinuses: Secondary | ICD-10-CM | POA: Diagnosis not present

## 2017-10-31 ENCOUNTER — Ambulatory Visit (INDEPENDENT_AMBULATORY_CARE_PROVIDER_SITE_OTHER): Payer: BLUE CROSS/BLUE SHIELD | Admitting: Internal Medicine

## 2017-10-31 ENCOUNTER — Encounter: Payer: Self-pay | Admitting: Internal Medicine

## 2017-10-31 DIAGNOSIS — N952 Postmenopausal atrophic vaginitis: Secondary | ICD-10-CM

## 2017-10-31 DIAGNOSIS — Z0000 Encounter for general adult medical examination without abnormal findings: Secondary | ICD-10-CM | POA: Diagnosis not present

## 2017-10-31 DIAGNOSIS — J3089 Other allergic rhinitis: Secondary | ICD-10-CM | POA: Diagnosis not present

## 2017-10-31 DIAGNOSIS — J301 Allergic rhinitis due to pollen: Secondary | ICD-10-CM | POA: Diagnosis not present

## 2017-10-31 MED ORDER — ESTROGENS, CONJUGATED 0.625 MG/GM VA CREA: TOPICAL_CREAM | VAGINAL | 2 refills | Status: DC

## 2017-10-31 MED ORDER — ASPIRIN EC 81 MG PO TBEC: 81.0000 mg | DELAYED_RELEASE_TABLET | Freq: Every day | ORAL | 3 refills | Status: DC

## 2017-10-31 NOTE — Patient Instructions (Signed)
Health Maintenance for Postmenopausal Women Menopause is a normal process in which your reproductive ability comes to an end. This process happens gradually over a span of months to years, usually between the ages of 22 and 9. Menopause is complete when you have missed 12 consecutive menstrual periods. It is important to talk with your health care provider about some of the most common conditions that affect postmenopausal women, such as heart disease, cancer, and bone loss (osteoporosis). Adopting a healthy lifestyle and getting preventive care can help to promote your health and wellness. Those actions can also lower your chances of developing some of these common conditions. What should I know about menopause? During menopause, you may experience a number of symptoms, such as:  Moderate-to-severe hot flashes.  Night sweats.  Decrease in sex drive.  Mood swings.  Headaches.  Tiredness.  Irritability.  Memory problems.  Insomnia.  Choosing to treat or not to treat menopausal changes is an individual decision that you make with your health care provider. What should I know about hormone replacement therapy and supplements? Hormone therapy products are effective for treating symptoms that are associated with menopause, such as hot flashes and night sweats. Hormone replacement carries certain risks, especially as you become older. If you are thinking about using estrogen or estrogen with progestin treatments, discuss the benefits and risks with your health care provider. What should I know about heart disease and stroke? Heart disease, heart attack, and stroke become more likely as you age. This may be due, in part, to the hormonal changes that your body experiences during menopause. These can affect how your body processes dietary fats, triglycerides, and cholesterol. Heart attack and stroke are both medical emergencies. There are many things that you can do to help prevent heart disease  and stroke:  Have your blood pressure checked at least every 1-2 years. High blood pressure causes heart disease and increases the risk of stroke.  If you are 53-22 years old, ask your health care provider if you should take aspirin to prevent a heart attack or a stroke.  Do not use any tobacco products, including cigarettes, chewing tobacco, or electronic cigarettes. If you need help quitting, ask your health care provider.  It is important to eat a healthy diet and maintain a healthy weight. ? Be sure to include plenty of vegetables, fruits, low-fat dairy products, and lean protein. ? Avoid eating foods that are high in solid fats, added sugars, or salt (sodium).  Get regular exercise. This is one of the most important things that you can do for your health. ? Try to exercise for at least 150 minutes each week. The type of exercise that you do should increase your heart rate and make you sweat. This is known as moderate-intensity exercise. ? Try to do strengthening exercises at least twice each week. Do these in addition to the moderate-intensity exercise.  Know your numbers.Ask your health care provider to check your cholesterol and your blood glucose. Continue to have your blood tested as directed by your health care provider.  What should I know about cancer screening? There are several types of cancer. Take the following steps to reduce your risk and to catch any cancer development as early as possible. Breast Cancer  Practice breast self-awareness. ? This means understanding how your breasts normally appear and feel. ? It also means doing regular breast self-exams. Let your health care provider know about any changes, no matter how small.  If you are 40  or older, have a clinician do a breast exam (clinical breast exam or CBE) every year. Depending on your age, family history, and medical history, it may be recommended that you also have a yearly breast X-ray (mammogram).  If you  have a family history of breast cancer, talk with your health care provider about genetic screening.  If you are at high risk for breast cancer, talk with your health care provider about having an MRI and a mammogram every year.  Breast cancer (BRCA) gene test is recommended for women who have family members with BRCA-related cancers. Results of the assessment will determine the need for genetic counseling and BRCA1 and for BRCA2 testing. BRCA-related cancers include these types: ? Breast. This occurs in males or females. ? Ovarian. ? Tubal. This may also be called fallopian tube cancer. ? Cancer of the abdominal or pelvic lining (peritoneal cancer). ? Prostate. ? Pancreatic.  Cervical, Uterine, and Ovarian Cancer Your health care provider may recommend that you be screened regularly for cancer of the pelvic organs. These include your ovaries, uterus, and vagina. This screening involves a pelvic exam, which includes checking for microscopic changes to the surface of your cervix (Pap test).  For women ages 21-65, health care providers may recommend a pelvic exam and a Pap test every three years. For women ages 79-65, they may recommend the Pap test and pelvic exam, combined with testing for human papilloma virus (HPV), every five years. Some types of HPV increase your risk of cervical cancer. Testing for HPV may also be done on women of any age who have unclear Pap test results.  Other health care providers may not recommend any screening for nonpregnant women who are considered low risk for pelvic cancer and have no symptoms. Ask your health care provider if a screening pelvic exam is right for you.  If you have had past treatment for cervical cancer or a condition that could lead to cancer, you need Pap tests and screening for cancer for at least 20 years after your treatment. If Pap tests have been discontinued for you, your risk factors (such as having a new sexual partner) need to be  reassessed to determine if you should start having screenings again. Some women have medical problems that increase the chance of getting cervical cancer. In these cases, your health care provider may recommend that you have screening and Pap tests more often.  If you have a family history of uterine cancer or ovarian cancer, talk with your health care provider about genetic screening.  If you have vaginal bleeding after reaching menopause, tell your health care provider.  There are currently no reliable tests available to screen for ovarian cancer.  Lung Cancer Lung cancer screening is recommended for adults 69-62 years old who are at high risk for lung cancer because of a history of smoking. A yearly low-dose CT scan of the lungs is recommended if you:  Currently smoke.  Have a history of at least 30 pack-years of smoking and you currently smoke or have quit within the past 15 years. A pack-year is smoking an average of one pack of cigarettes per day for one year.  Yearly screening should:  Continue until it has been 15 years since you quit.  Stop if you develop a health problem that would prevent you from having lung cancer treatment.  Colorectal Cancer  This type of cancer can be detected and can often be prevented.  Routine colorectal cancer screening usually begins at  age 42 and continues through age 45.  If you have risk factors for colon cancer, your health care provider may recommend that you be screened at an earlier age.  If you have a family history of colorectal cancer, talk with your health care provider about genetic screening.  Your health care provider may also recommend using home test kits to check for hidden blood in your stool.  A small camera at the end of a tube can be used to examine your colon directly (sigmoidoscopy or colonoscopy). This is done to check for the earliest forms of colorectal cancer.  Direct examination of the colon should be repeated every  5-10 years until age 71. However, if early forms of precancerous polyps or small growths are found or if you have a family history or genetic risk for colorectal cancer, you may need to be screened more often.  Skin Cancer  Check your skin from head to toe regularly.  Monitor any moles. Be sure to tell your health care provider: ? About any new moles or changes in moles, especially if there is a change in a mole's shape or color. ? If you have a mole that is larger than the size of a pencil eraser.  If any of your family members has a history of skin cancer, especially at a young age, talk with your health care provider about genetic screening.  Always use sunscreen. Apply sunscreen liberally and repeatedly throughout the day.  Whenever you are outside, protect yourself by wearing long sleeves, pants, a wide-brimmed hat, and sunglasses.  What should I know about osteoporosis? Osteoporosis is a condition in which bone destruction happens more quickly than new bone creation. After menopause, you may be at an increased risk for osteoporosis. To help prevent osteoporosis or the bone fractures that can happen because of osteoporosis, the following is recommended:  If you are 46-71 years old, get at least 1,000 mg of calcium and at least 600 mg of vitamin D per day.  If you are older than age 55 but younger than age 65, get at least 1,200 mg of calcium and at least 600 mg of vitamin D per day.  If you are older than age 54, get at least 1,200 mg of calcium and at least 800 mg of vitamin D per day.  Smoking and excessive alcohol intake increase the risk of osteoporosis. Eat foods that are rich in calcium and vitamin D, and do weight-bearing exercises several times each week as directed by your health care provider. What should I know about how menopause affects my mental health? Depression may occur at any age, but it is more common as you become older. Common symptoms of depression  include:  Low or sad mood.  Changes in sleep patterns.  Changes in appetite or eating patterns.  Feeling an overall lack of motivation or enjoyment of activities that you previously enjoyed.  Frequent crying spells.  Talk with your health care provider if you think that you are experiencing depression. What should I know about immunizations? It is important that you get and maintain your immunizations. These include:  Tetanus, diphtheria, and pertussis (Tdap) booster vaccine.  Influenza every year before the flu season begins.  Pneumonia vaccine.  Shingles vaccine.  Your health care provider may also recommend other immunizations. This information is not intended to replace advice given to you by your health care provider. Make sure you discuss any questions you have with your health care provider. Document Released: 07/27/2005  Document Revised: 12/23/2015 Document Reviewed: 03/08/2015 Elsevier Interactive Patient Education  2018 [employer]  

## 2017-10-31 NOTE — Progress Notes (Signed)
Subjective:  Patient ID: [name], female    DOB: 04-Oct-1950  Age: 67 y.o. MRN: [id number]  CC: No chief complaint on file.   HPI Tyah Acord presents for a well exam  Outpatient Medications Prior to Visit  Medication Sig Dispense Refill  . ALPRAZolam (XANAX) 0.25 MG tablet 1 po bid prn anxiety 30 tablet 1  . ALREX 0.2 % SUSP Apply 0.2 drops to eye as needed (allergies).     . Botulinum Toxin Type A, Cosm, (BOTOX COSMETIC) 50 UNITS SOLR Inject 4-6 Units into the muscle. 3 month  injection    . conjugated estrogens (PREMARIN) vaginal cream Use PV once every 2-3 days 90 g 2  . Diclofenac Sodium (PENNSAID) 2 % SOLN Place 2 application onto the skin 2 (two) times daily. 112 g 3  . EPIPEN 2-PAK 0.3 MG/0.3ML SOAJ injection See admin instructions. Reported on 10/08/2015  1  . fexofenadine (ALLEGRA ALLERGY CHILDRENS) 30 MG tablet Take 30 mg by mouth 2 (two) times daily as needed (allergies).    . fluocinonide cream (LIDEX) 2.70 % Apply 1 application topically 2 (two) times daily. 90 g 2  . fluticasone (FLONASE) 50 MCG/ACT nasal spray Place 1 spray into both nostrils as needed for allergies.   5  . Hyaluronic Acid (RESTYLANE IJ) Inject as directed every 6 (six) months.    . meclizine (ANTIVERT) 25 MG tablet Take 1 tablet (25 mg total) by mouth 3 (three) times daily as needed for dizziness. 30 tablet 0  . omeprazole (PRILOSEC) 40 MG capsule Take 40 mg by mouth daily.    [name] tretinoin (RETIN-A) 0.05 % cream Apply 1 application topically at bedtime.    . triamcinolone cream (KENALOG) 0.1 % APPLY TO AFFECTED AREA 3 TIMES A DAY 30 g 0   No facility-administered medications prior to visit.     ROS Review of Systems  Constitutional: Negative for activity change, appetite change, chills, fatigue and unexpected weight change.  HENT: Negative for congestion, mouth sores and sinus pressure.   Eyes: Negative for visual disturbance.  Respiratory: Negative for cough and chest tightness.     Gastrointestinal: Negative for abdominal pain and nausea.  Genitourinary: Negative for difficulty urinating, frequency and vaginal pain.  Musculoskeletal: Negative for back pain and gait problem.  Skin: Negative for pallor and rash.  Neurological: Negative for dizziness, tremors, weakness, numbness and headaches.  Psychiatric/Behavioral: Negative for confusion, dysphoric mood, sleep disturbance and suicidal ideas.    Objective:  BP 114/72 (BP Location: Left Arm, Patient Position: Sitting, Cuff Size: Normal)   Pulse 68   Temp 98.2 F (36.8 C) (Oral)   Ht 5' 1.5" (1.562 m)   Wt 103 lb (46.7 kg)   SpO2 100%   BMI 19.15 kg/m   BP Readings from Last 3 Encounters:  10/31/17 114/72  09/23/17 110/78  09/17/17 120/80    Wt Readings from Last 3 Encounters:  10/31/17 103 lb (46.7 kg)  09/23/17 102 lb (46.3 kg)  09/17/17 102 lb (46.3 kg)    Physical Exam  Constitutional: She appears well-developed. No distress.  HENT:  Head: Normocephalic.  Right Ear: External ear normal.  Left Ear: External ear normal.  Nose: Nose normal.  Mouth/Throat: Oropharynx is clear and moist.  Eyes: Pupils are equal, round, and reactive to light. Conjunctivae are normal. Right eye exhibits no discharge. Left eye exhibits no discharge.  Neck: Normal [name] of motion. Neck supple. No JVD present. No tracheal deviation present. No thyromegaly present.  Cardiovascular:  Normal rate, regular rhythm and normal heart sounds.  Pulmonary/Chest: No stridor. No respiratory distress. She has no wheezes.  Abdominal: Soft. Bowel sounds are normal. She exhibits no distension and no mass. There is no tenderness. There is no rebound and no guarding.  Musculoskeletal: She exhibits no edema or tenderness.  Lymphadenopathy:    She has no cervical adenopathy.  Neurological: She displays normal reflexes. No cranial nerve deficit. She exhibits normal muscle tone. Coordination normal.  Skin: No rash noted. No erythema.   Psychiatric: She has a normal mood and affect. Her behavior is normal. Judgment and thought content normal.  Pt declined PAP Breast exam - B WNL  Lab Results  Component Value Date   WBC 8.0 12/25/2016   HGB 12.8 12/25/2016   HCT 38.0 12/25/2016   PLT 201.0 12/25/2016   GLUCOSE 92 01/31/2017   CHOL 219 (H) 01/31/2017   TRIG 78.0 01/31/2017   HDL 74.60 01/31/2017   LDLCALC 128 (H) 01/31/2017   ALT 11 09/12/2016   AST 14 09/12/2016   NA 139 01/31/2017   K 4.8 01/31/2017   CL 105 01/31/2017   CREATININE 0.83 01/31/2017   BUN 19 01/31/2017   CO2 28 01/31/2017   TSH 1.52 09/12/2016    US Pelvis (transabdominal Only)  Result Date: 09/30/2017 CLINICAL DATA:  Left-sided pelvic pain EXAM: TRANSABDOMINAL ULTRASOUND OF PELVIS TECHNIQUE: Transabdominal ultrasound examination of the pelvis was performed in longitudinal and transverse projections. COMPARISON:  CT abdomen and pelvis April 18, 2015 FINDINGS: Uterus Measurements: 5.1 x 1.3 x 2.9 cm. No fibroids or other mass visualized. Endometrium Thickness: 2 mm.  No focal abnormality visualized. Right ovary Unable to visualize.  No right-sided pelvic mass. Left ovary Unable to visualize.  No left-sided pelvic mass. Other findings:  No abnormal free fluid. IMPRESSION: Uterus is rather small consistent with postmenopausal state. No focal uterine lesion. No endometrial thickening. Neither ovary visualized, a finding likely due to overlying gas and postmenopausal atrophy. No extrauterine pelvic lesion evident. No free pelvic fluid. Electronically Signed   By: Lowella Grip III M.D.   On: 09/30/2017 08:50    Assessment & Plan:   There are no diagnoses linked to this encounter. I am having [name] maintain her Botulinum Toxin Type A (Cosm), ALREX, Hyaluronic Acid (RESTYLANE IJ), fluticasone, tretinoin, fluocinonide cream, EPIPEN 2-PAK, fexofenadine, meclizine, Diclofenac Sodium, ALPRAZolam, conjugated estrogens, triamcinolone cream,  and omeprazole.  No orders of the defined types were placed in this encounter.    Follow-up: No follow-ups on file.  [name], MD

## 2017-10-31 NOTE — Assessment & Plan Note (Signed)
We discussed age appropriate health related issues, including available/recomended screening tests and vaccinations. We discussed a need for adhering to healthy diet and exercise. Labs/EKG were reviewed/ordered. All questions were answered. Mammo q 12 mo Eye appt q 12 mo Pt declined shots Labs in 6 mo

## 2017-11-04 DIAGNOSIS — J3089 Other allergic rhinitis: Secondary | ICD-10-CM | POA: Diagnosis not present

## 2017-11-04 DIAGNOSIS — J301 Allergic rhinitis due to pollen: Secondary | ICD-10-CM | POA: Diagnosis not present

## 2017-11-06 ENCOUNTER — Ambulatory Visit: Payer: Medicare Other | Admitting: Podiatry

## 2017-11-12 DIAGNOSIS — Z85828 Personal history of other malignant neoplasm of skin: Secondary | ICD-10-CM | POA: Diagnosis not present

## 2017-11-12 DIAGNOSIS — Z808 Family history of malignant neoplasm of other organs or systems: Secondary | ICD-10-CM | POA: Diagnosis not present

## 2017-11-12 DIAGNOSIS — Z86018 Personal history of other benign neoplasm: Secondary | ICD-10-CM | POA: Diagnosis not present

## 2017-11-12 DIAGNOSIS — L089 Local infection of the skin and subcutaneous tissue, unspecified: Secondary | ICD-10-CM | POA: Diagnosis not present

## 2017-11-12 DIAGNOSIS — D1800 Hemangioma unspecified site: Secondary | ICD-10-CM | POA: Diagnosis not present

## 2017-11-12 DIAGNOSIS — D225 Melanocytic nevi of trunk: Secondary | ICD-10-CM | POA: Diagnosis not present

## 2017-11-14 DIAGNOSIS — J301 Allergic rhinitis due to pollen: Secondary | ICD-10-CM | POA: Diagnosis not present

## 2017-11-14 DIAGNOSIS — J3089 Other allergic rhinitis: Secondary | ICD-10-CM | POA: Diagnosis not present

## 2017-11-18 ENCOUNTER — Ambulatory Visit: Payer: BLUE CROSS/BLUE SHIELD | Admitting: Podiatry

## 2017-11-22 ENCOUNTER — Ambulatory Visit (INDEPENDENT_AMBULATORY_CARE_PROVIDER_SITE_OTHER): Payer: BLUE CROSS/BLUE SHIELD | Admitting: Podiatry

## 2017-11-22 ENCOUNTER — Encounter: Payer: Self-pay | Admitting: Podiatry

## 2017-11-22 DIAGNOSIS — L600 Ingrowing nail: Secondary | ICD-10-CM | POA: Diagnosis not present

## 2017-11-22 DIAGNOSIS — L03031 Cellulitis of right toe: Secondary | ICD-10-CM

## 2017-11-22 DIAGNOSIS — J3089 Other allergic rhinitis: Secondary | ICD-10-CM | POA: Diagnosis not present

## 2017-11-22 DIAGNOSIS — I6523 Occlusion and stenosis of bilateral carotid arteries: Secondary | ICD-10-CM

## 2017-11-22 DIAGNOSIS — J301 Allergic rhinitis due to pollen: Secondary | ICD-10-CM | POA: Diagnosis not present

## 2017-11-24 NOTE — Progress Notes (Signed)
Subjective:   Patient ID: [name], female   DOB: 67 y.o.   MRN: [id number]   HPI Patient presents concerned about the appearance of her nails with long-term history of structural bunion deformity   ROS      Objective:  Physical Exam  Neurovascular status intact with patient noted to have slight discoloration of the nailbeds bilateral with significant structural bunion deformity bilateral     Assessment:  Very localized mycotic and nail condition but more due to the structure of the toes with significant bunion hammertoe deformity     Plan:  Education rendered on condition and do not recommend treatment unless symptoms were to get worse or other pathology were to occur

## 2017-11-25 DIAGNOSIS — J301 Allergic rhinitis due to pollen: Secondary | ICD-10-CM | POA: Diagnosis not present

## 2017-11-25 DIAGNOSIS — J3089 Other allergic rhinitis: Secondary | ICD-10-CM | POA: Diagnosis not present

## 2017-11-27 ENCOUNTER — Encounter: Payer: Self-pay | Admitting: Internal Medicine

## 2017-12-05 ENCOUNTER — Encounter: Payer: Self-pay | Admitting: Cardiovascular Disease

## 2017-12-06 DIAGNOSIS — J3089 Other allergic rhinitis: Secondary | ICD-10-CM | POA: Diagnosis not present

## 2017-12-06 DIAGNOSIS — J301 Allergic rhinitis due to pollen: Secondary | ICD-10-CM | POA: Diagnosis not present

## 2017-12-06 DIAGNOSIS — J3081 Allergic rhinitis due to animal (cat) (dog) hair and dander: Secondary | ICD-10-CM | POA: Diagnosis not present

## 2017-12-12 DIAGNOSIS — J301 Allergic rhinitis due to pollen: Secondary | ICD-10-CM | POA: Diagnosis not present

## 2017-12-12 DIAGNOSIS — J3089 Other allergic rhinitis: Secondary | ICD-10-CM | POA: Diagnosis not present

## 2017-12-18 DIAGNOSIS — J3089 Other allergic rhinitis: Secondary | ICD-10-CM | POA: Diagnosis not present

## 2017-12-18 DIAGNOSIS — J301 Allergic rhinitis due to pollen: Secondary | ICD-10-CM | POA: Diagnosis not present

## 2017-12-23 DIAGNOSIS — L905 Scar conditions and fibrosis of skin: Secondary | ICD-10-CM | POA: Diagnosis not present

## 2017-12-23 DIAGNOSIS — L0100 Impetigo, unspecified: Secondary | ICD-10-CM | POA: Diagnosis not present

## 2017-12-24 DIAGNOSIS — J3089 Other allergic rhinitis: Secondary | ICD-10-CM | POA: Diagnosis not present

## 2017-12-24 DIAGNOSIS — J301 Allergic rhinitis due to pollen: Secondary | ICD-10-CM | POA: Diagnosis not present

## 2017-12-26 ENCOUNTER — Ambulatory Visit (INDEPENDENT_AMBULATORY_CARE_PROVIDER_SITE_OTHER): Payer: Medicare Other | Admitting: Internal Medicine

## 2017-12-26 ENCOUNTER — Encounter: Payer: Self-pay | Admitting: Internal Medicine

## 2017-12-26 DIAGNOSIS — S81801A Unspecified open wound, right lower leg, initial encounter: Secondary | ICD-10-CM | POA: Insufficient documentation

## 2017-12-26 DIAGNOSIS — I6523 Occlusion and stenosis of bilateral carotid arteries: Secondary | ICD-10-CM

## 2017-12-26 DIAGNOSIS — N952 Postmenopausal atrophic vaginitis: Secondary | ICD-10-CM

## 2017-12-26 NOTE — Patient Instructions (Signed)
ScarFade gel

## 2017-12-26 NOTE — Progress Notes (Signed)
Subjective:  Patient ID: [name], female    DOB: [birth year]  Age: 67 y.o. MRN: [id number]  CC: No chief complaint on file.   HPI [name] presents for R lower leg wound C/o atrophic vaginitis  Outpatient Medications Prior to Visit  Medication Sig Dispense Refill  . ALPRAZolam (XANAX) 0.25 MG tablet 1 po bid prn anxiety 30 tablet 1  . ALREX 0.2 % SUSP Apply 0.2 drops to eye as needed (allergies).     [name] aspirin EC 81 MG tablet Take 1 tablet (81 mg total) by mouth daily. 100 tablet 3  . Botulinum Toxin Type A, Cosm, (BOTOX COSMETIC) 50 UNITS SOLR Inject 4-6 Units into the muscle. 3 month  injection    . conjugated estrogens (PREMARIN) vaginal cream Use PV once every 2-3 days 90 g 2  . Diclofenac Sodium (PENNSAID) 2 % SOLN Place 2 application onto the skin 2 (two) times daily. 112 g 3  . EPIPEN 2-PAK 0.3 MG/0.3ML SOAJ injection See admin instructions. Reported on 10/08/2015  1  . fexofenadine (ALLEGRA ALLERGY CHILDRENS) 30 MG tablet Take 30 mg by mouth 2 (two) times daily as needed (allergies).    . fluocinonide cream (LIDEX) 1.69 % Apply 1 application topically 2 (two) times daily. 90 g 2  . fluticasone (FLONASE) 50 MCG/ACT nasal spray Place 1 spray into both nostrils as needed for allergies.   5  . Hyaluronic Acid (RESTYLANE IJ) Inject as directed every 6 (six) months.    . meclizine (ANTIVERT) 25 MG tablet Take 1 tablet (25 mg total) by mouth 3 (three) times daily as needed for dizziness. 30 tablet 0  . mupirocin ointment (BACTROBAN) 2 % APPLY TO AFFECTED AREA TWICE A DAY FOR 7 DAYS  0  . omeprazole (PRILOSEC) 40 MG capsule Take 40 mg by mouth daily.    [name] tretinoin (RETIN-A) 0.05 % cream Apply 1 application topically at bedtime.    . triamcinolone cream (KENALOG) 0.1 % APPLY TO AFFECTED AREA 3 TIMES A DAY 30 g 0   No facility-administered medications prior to visit.     ROS: Review of Systems  Skin: Positive for wound.    Objective:  BP 114/76 (BP Location:  Right Arm, Patient Position: Sitting, Cuff Size: Normal)   Pulse 66   Temp 98.8 F (37.1 C) (Oral)   Ht 5' 1.5" (1.562 m)   Wt 103 lb (46.7 kg)   SpO2 98%   BMI 19.15 kg/m   BP Readings from Last 3 Encounters:  12/26/17 114/76  10/31/17 114/72  09/23/17 110/78    Wt Readings from Last 3 Encounters:  12/26/17 103 lb (46.7 kg)  10/31/17 103 lb (46.7 kg)  09/23/17 102 lb (46.3 kg)    Physical Exam  Constitutional: She appears well-developed and well-nourished.  Skin: No rash noted. No erythema. No pallor.  Psychiatric: She has a normal mood and affect.   R dist lat shin wound w/a scab - clean Lab Results  Component Value Date   WBC 8.0 12/25/2016   HGB 12.8 12/25/2016   HCT 38.0 12/25/2016   PLT 201.0 12/25/2016   GLUCOSE 92 01/31/2017   CHOL 219 (H) 01/31/2017   TRIG 78.0 01/31/2017   HDL 74.60 01/31/2017   LDLCALC 128 (H) 01/31/2017   ALT 11 09/12/2016   AST 14 09/12/2016   NA 139 01/31/2017   K 4.8 01/31/2017   CL 105 01/31/2017   CREATININE 0.83 01/31/2017   BUN 19 01/31/2017   CO2 28  01/31/2017   TSH 1.52 09/12/2016    US Pelvis (transabdominal Only)  Result Date: 09/30/2017 CLINICAL DATA:  Left-sided pelvic pain EXAM: TRANSABDOMINAL ULTRASOUND OF PELVIS TECHNIQUE: Transabdominal ultrasound examination of the pelvis was performed in longitudinal and transverse projections. COMPARISON:  CT abdomen and pelvis April 18, 2015 FINDINGS: Uterus Measurements: 5.1 x 1.3 x 2.9 cm. No fibroids or other mass visualized. Endometrium Thickness: 2 mm.  No focal abnormality visualized. Right ovary Unable to visualize.  No right-sided pelvic mass. Left ovary Unable to visualize.  No left-sided pelvic mass. Other findings:  No abnormal free fluid. IMPRESSION: Uterus is rather small consistent with postmenopausal state. No focal uterine lesion. No endometrial thickening. Neither ovary visualized, a finding likely due to overlying gas and postmenopausal atrophy. No extrauterine  pelvic lesion evident. No free pelvic fluid. Electronically Signed   By: Lowella Grip III M.D.   On: 09/30/2017 08:50    Assessment & Plan:   There are no diagnoses linked to this encounter.   No orders of the defined types were placed in this encounter.    Follow-up: No follow-ups on file.  [name], MD

## 2017-12-26 NOTE — Assessment & Plan Note (Signed)
Looks clean Care discussed

## 2017-12-26 NOTE — Assessment & Plan Note (Addendum)
Use vaginal [place] prn Premarin PV 3/wk

## 2017-12-30 DIAGNOSIS — J301 Allergic rhinitis due to pollen: Secondary | ICD-10-CM | POA: Diagnosis not present

## 2017-12-30 DIAGNOSIS — J3089 Other allergic rhinitis: Secondary | ICD-10-CM | POA: Diagnosis not present

## 2017-12-31 ENCOUNTER — Ambulatory Visit: Payer: BLUE CROSS/BLUE SHIELD | Admitting: Internal Medicine

## 2018-01-03 ENCOUNTER — Telehealth: Payer: Self-pay | Admitting: Neurology

## 2018-01-03 ENCOUNTER — Ambulatory Visit: Payer: BLUE CROSS/BLUE SHIELD | Admitting: Internal Medicine

## 2018-01-03 NOTE — Telephone Encounter (Signed)
Larene Beach at [facility] emailed me and informed me the patient stopped by there to schedule her MRI's. They were order on 12/18/16 and she never had them done because she canceled her appointment from last year. Larene Beach wants to know if it is still good for her to have these MRI's?

## 2018-01-03 NOTE — Telephone Encounter (Signed)
Yes [name] said the order's are good until Sept. 3.

## 2018-01-03 NOTE — Telephone Encounter (Signed)
Yes, she can have her MRIs- are the orders still good ?

## 2018-01-03 NOTE — Telephone Encounter (Signed)
let's go.

## 2018-01-06 DIAGNOSIS — J301 Allergic rhinitis due to pollen: Secondary | ICD-10-CM | POA: Diagnosis not present

## 2018-01-06 DIAGNOSIS — J3089 Other allergic rhinitis: Secondary | ICD-10-CM | POA: Diagnosis not present

## 2018-01-13 DIAGNOSIS — J3089 Other allergic rhinitis: Secondary | ICD-10-CM | POA: Diagnosis not present

## 2018-01-13 DIAGNOSIS — J301 Allergic rhinitis due to pollen: Secondary | ICD-10-CM | POA: Diagnosis not present

## 2018-01-22 ENCOUNTER — Encounter: Payer: Self-pay | Admitting: Cardiovascular Disease

## 2018-01-22 DIAGNOSIS — H5319 Other subjective visual disturbances: Secondary | ICD-10-CM | POA: Diagnosis not present

## 2018-01-22 DIAGNOSIS — J301 Allergic rhinitis due to pollen: Secondary | ICD-10-CM | POA: Diagnosis not present

## 2018-01-22 DIAGNOSIS — J3089 Other allergic rhinitis: Secondary | ICD-10-CM | POA: Diagnosis not present

## 2018-01-23 ENCOUNTER — Encounter: Payer: Self-pay | Admitting: Cardiovascular Disease

## 2018-01-23 ENCOUNTER — Other Ambulatory Visit: Payer: Self-pay | Admitting: Internal Medicine

## 2018-01-23 ENCOUNTER — Telehealth: Payer: Self-pay | Admitting: Cardiovascular Disease

## 2018-01-23 DIAGNOSIS — I6523 Occlusion and stenosis of bilateral carotid arteries: Secondary | ICD-10-CM

## 2018-01-23 DIAGNOSIS — H3402 Transient retinal artery occlusion, left eye: Secondary | ICD-10-CM | POA: Diagnosis not present

## 2018-01-23 DIAGNOSIS — H539 Unspecified visual disturbance: Secondary | ICD-10-CM

## 2018-01-23 NOTE — Telephone Encounter (Signed)
I spoke with [name] at Dr. Zenia Resides office and told her pt had been in touch with our office regarding vision episode.  I told her Dr. Angelena Form has said we could arrange carotid doppler study.   I spoke to pt and scheduled carotid doppler study for 01/24/18 at noon.  Will forward to Dr. Angelena Form to see if he feels office visit or other testing is needed

## 2018-01-23 NOTE — Telephone Encounter (Signed)
New message   Per Larene Beach at [name] office, states that the patient is having an episode of Amaurosys Fugax in her left eye. [name] wants know if Dr. Angelena Form wants to do another evaluation because of the episode she is having. [name] will leave the decision up to him and wants to know if Dr. Angelena Form would like to see the patient. Please contact the patient with the information and send any corresponding notes to [name].

## 2018-01-23 NOTE — Telephone Encounter (Signed)
I spoke with pt and gave her information from Dr. Angelena Form. She reports she had set up an appointment with primary care for evaluation tomorrow in case she did not hear from our office.  She will plan on cancelling appointment in primary care tomorrow since carotid doppler has been arranged. She was seen in Dr. Zenia Resides office today.  I asked her to follow up with primary care if she continues to have visual changes.

## 2018-01-23 NOTE — Telephone Encounter (Signed)
I dont think she needs a cardiac office visit but if she continues to have visual symptoms, she would need to see her primary care doctor. Thanks, chris

## 2018-01-23 NOTE — Telephone Encounter (Signed)
Pt called back and stated she was going to have hair done today. Asking if OK to lean head/neck back to have hair washed. I told her this should be OK.

## 2018-01-24 ENCOUNTER — Ambulatory Visit (HOSPITAL_COMMUNITY)
Admission: RE | Admit: 2018-01-24 | Discharge: 2018-01-24 | Disposition: A | Discharge status: Home / Self Care | Payer: Medicare Other | Source: Ambulatory Visit | Attending: Cardiology | Admitting: Cardiology

## 2018-01-24 ENCOUNTER — Ambulatory Visit: Payer: Medicare Other | Admitting: Internal Medicine

## 2018-01-24 DIAGNOSIS — I6523 Occlusion and stenosis of bilateral carotid arteries: Secondary | ICD-10-CM | POA: Diagnosis not present

## 2018-01-24 DIAGNOSIS — H539 Unspecified visual disturbance: Secondary | ICD-10-CM | POA: Diagnosis not present

## 2018-01-27 ENCOUNTER — Encounter: Payer: Self-pay | Admitting: Internal Medicine

## 2018-01-27 ENCOUNTER — Ambulatory Visit (INDEPENDENT_AMBULATORY_CARE_PROVIDER_SITE_OTHER): Payer: Medicare Other | Admitting: Internal Medicine

## 2018-01-27 ENCOUNTER — Other Ambulatory Visit: Payer: Self-pay | Admitting: Internal Medicine

## 2018-01-27 ENCOUNTER — Other Ambulatory Visit (INDEPENDENT_AMBULATORY_CARE_PROVIDER_SITE_OTHER): Payer: Medicare Other

## 2018-01-27 VITALS — BP 118/70 | HR 69 | Temp 98.6°F | Ht 61.5 in | Wt 101.0 lb

## 2018-01-27 DIAGNOSIS — I6523 Occlusion and stenosis of bilateral carotid arteries: Secondary | ICD-10-CM | POA: Diagnosis not present

## 2018-01-27 DIAGNOSIS — R202 Paresthesia of skin: Secondary | ICD-10-CM

## 2018-01-27 DIAGNOSIS — G43109 Migraine with aura, not intractable, without status migrainosus: Secondary | ICD-10-CM | POA: Diagnosis not present

## 2018-01-27 LAB — BASIC METABOLIC PANEL
BUN: 15 mg/dL (ref 6–23)
CO2: 30 mEq/L (ref 19–32)
Calcium: 9.8 mg/dL (ref 8.4–10.5)
Chloride: 104 mEq/L (ref 96–112)
Creatinine, Ser: 0.86 mg/dL (ref 0.40–1.20)
GFR: 70.33 mL/min (ref >60.00)
Glucose, Bld: 95 mg/dL (ref 70–99)
Potassium: 4 mEq/L (ref 3.5–5.1)
Sodium: 139 mEq/L (ref 135–145)

## 2018-01-27 LAB — CBC WITH DIFFERENTIAL/PLATELET
Basophils Absolute: 0.1 10*3/uL (ref 0.0–0.1)
Basophils Relative: 1.3 % (ref 0.0–3.0)
Eosinophils Absolute: 0.1 10*3/uL (ref 0.0–0.7)
Eosinophils Relative: 1.7 % (ref 0.0–5.0)
HCT: 37.4 % (ref 36.0–46.0)
Hemoglobin: 12.8 g/dL (ref 12.0–15.0)
Lymphocytes Relative: 29.8 % (ref 12.0–46.0)
Lymphs Abs: 1.3 10*3/uL (ref 0.7–4.0)
MCHC: 34.3 g/dL (ref 30.0–36.0)
MCV: 91.2 fl (ref 78.0–100.0)
Monocytes Absolute: 0.3 10*3/uL (ref 0.1–1.0)
Monocytes Relative: 7.5 % (ref 3.0–12.0)
Neutro Abs: 2.6 10*3/uL (ref 1.4–7.7)
Neutrophils Relative %: 59.7 % (ref 43.0–77.0)
Platelets: 192 10*3/uL (ref 150.0–400.0)
RBC: 4.1 Mil/uL (ref 3.87–5.11)
RDW: 12.5 % (ref 11.5–15.5)
WBC: 4.4 10*3/uL (ref 4.0–10.5)

## 2018-01-27 LAB — SEDIMENTATION RATE: Sed Rate: 14 mm/hr (ref 0–30)

## 2018-01-27 LAB — TSH: TSH: 1.29 u[IU]/mL (ref 0.35–4.50)

## 2018-01-27 LAB — VITAMIN B12: VITAMIN B 12: 217 pg/mL (ref 211–911)

## 2018-01-27 MED ORDER — VITAMIN B-12 1000 MCG SL SUBL: 1.0000 | SUBLINGUAL_TABLET | Freq: Every day | SUBLINGUAL | 3 refills | Status: DC

## 2018-01-27 NOTE — Progress Notes (Signed)
Subjective:  Patient ID: [name], female    DOB: Jun 16, 1951  Age: 67 y.o. MRN: [id number]  CC: No chief complaint on file.   HPI [name] presents for a shade that went down the L eye on Wed x 10 min. She saw Dr Annamaria Boots - no retina disorder.Then she saw Dr [name] - ?migraine. Dr Renee Ramus ordered a carotid test - nl.  H/o migraine.  Outpatient Medications Prior to Visit  Medication Sig Dispense Refill  . ALPRAZolam (XANAX) 0.25 MG tablet 1 po bid prn anxiety 30 tablet 1  . ALREX 0.2 % SUSP Apply 0.2 drops to eye as needed (allergies).     [name] aspirin EC 81 MG tablet Take 1 tablet (81 mg total) by mouth daily. 100 tablet 3  . Botulinum Toxin Type A, Cosm, (BOTOX COSMETIC) 50 UNITS SOLR Inject 4-6 Units into the muscle. 3 month  injection    . conjugated estrogens (PREMARIN) vaginal cream Use PV once every 2-3 days 90 g 2  . Diclofenac Sodium (PENNSAID) 2 % SOLN Place 2 application onto the skin 2 (two) times daily. 112 g 3  . EPIPEN 2-PAK 0.3 MG/0.3ML SOAJ injection See admin instructions. Reported on 10/08/2015  1  . fexofenadine (ALLEGRA ALLERGY CHILDRENS) 30 MG tablet Take 30 mg by mouth 2 (two) times daily as needed (allergies).    . fluocinonide cream (LIDEX) 0.10 % Apply 1 application topically 2 (two) times daily. 90 g 2  . fluticasone (FLONASE) 50 MCG/ACT nasal spray Place 1 spray into both nostrils as needed for allergies.   5  . Hyaluronic Acid (RESTYLANE IJ) Inject as directed every 6 (six) months.    . meclizine (ANTIVERT) 25 MG tablet Take 1 tablet (25 mg total) by mouth 3 (three) times daily as needed for dizziness. 30 tablet 0  . mupirocin ointment (BACTROBAN) 2 % APPLY TO AFFECTED AREA TWICE A DAY FOR 7 DAYS  0  . omeprazole (PRILOSEC) 40 MG capsule Take 40 mg by mouth daily.    [name] tretinoin (RETIN-A) 0.05 % cream Apply 1 application topically at bedtime.    . triamcinolone cream (KENALOG) 0.1 % APPLY TO AFFECTED AREA 3 TIMES A DAY 30 g 0   No  facility-administered medications prior to visit.     ROS: Review of Systems  Constitutional: Negative for activity change, appetite change, chills, fatigue and unexpected weight change.  HENT: Negative for congestion, mouth sores and sinus pressure.   Eyes: Positive for visual disturbance.  Respiratory: Negative for cough and chest tightness.   Gastrointestinal: Negative for abdominal pain and nausea.  Genitourinary: Negative for difficulty urinating, frequency and vaginal pain.  Musculoskeletal: Negative for back pain and gait problem.  Skin: Negative for pallor and rash.  Neurological: Negative for dizziness, tremors, weakness, numbness and headaches.  Psychiatric/Behavioral: Negative for confusion, sleep disturbance and suicidal ideas. The patient is nervous/anxious.     Objective:  BP 118/70 (BP Location: Left Arm, Patient Position: Sitting, Cuff Size: Normal)   Pulse 69   Temp 98.6 F (37 C) (Oral)   Ht 5' 1.5" (1.562 m)   Wt 101 lb (45.8 kg)   SpO2 98%   BMI 18.77 kg/m   BP Readings from Last 3 Encounters:  01/27/18 118/70  12/26/17 114/76  10/31/17 114/72    Wt Readings from Last 3 Encounters:  01/27/18 101 lb (45.8 kg)  12/26/17 103 lb (46.7 kg)  10/31/17 103 lb (46.7 kg)    Physical Exam  Constitutional: She  appears well-developed. No distress.  HENT:  Head: Normocephalic.  Right Ear: External ear normal.  Left Ear: External ear normal.  Nose: Nose normal.  Mouth/Throat: Oropharynx is clear and moist.  Eyes: Pupils are equal, round, and reactive to light. Conjunctivae are normal. Right eye exhibits no discharge. Left eye exhibits no discharge.  Neck: Normal [name] of motion. Neck supple. No JVD present. No tracheal deviation present. No thyromegaly present.  Cardiovascular: Normal rate, regular rhythm and normal heart sounds.  Pulmonary/Chest: No stridor. No respiratory distress. She has no wheezes.  Abdominal: Soft. Bowel sounds are normal. She exhibits  no distension and no mass. There is no tenderness. There is no rebound and no guarding.  Musculoskeletal: She exhibits no edema or tenderness.  Lymphadenopathy:    She has no cervical adenopathy.  Neurological: She displays normal reflexes. No cranial nerve deficit. She exhibits normal muscle tone. Coordination normal.  Skin: No rash noted. No erythema.  Psychiatric: She has a normal mood and affect. Her behavior is normal. Judgment and thought content normal.    Lab Results  Component Value Date   WBC 8.0 12/25/2016   HGB 12.8 12/25/2016   HCT 38.0 12/25/2016   PLT 201.0 12/25/2016   GLUCOSE 92 01/31/2017   CHOL 219 (H) 01/31/2017   TRIG 78.0 01/31/2017   HDL 74.60 01/31/2017   LDLCALC 128 (H) 01/31/2017   ALT 11 09/12/2016   AST 14 09/12/2016   NA 139 01/31/2017   K 4.8 01/31/2017   CL 105 01/31/2017   CREATININE 0.83 01/31/2017   BUN 19 01/31/2017   CO2 28 01/31/2017   TSH 1.52 09/12/2016    No results found.  Assessment & Plan:   There are no diagnoses linked to this encounter.   No orders of the defined types were placed in this encounter.    Follow-up: No follow-ups on file.  [name], MD

## 2018-01-27 NOTE — Assessment & Plan Note (Addendum)
Start ASA 81 mg Pt declined a brain MRI a shade that went down the L eye on Wed x 10 min. She saw Dr Annamaria Boots - no retina disorder.Then she saw Dr [name] - ?migraine. Dr Renee Ramus ordered a carotid test - nl.  ECHO 2019

## 2018-01-29 DIAGNOSIS — H01134 Eczematous dermatitis of left upper eyelid: Secondary | ICD-10-CM | POA: Diagnosis not present

## 2018-01-31 DIAGNOSIS — J301 Allergic rhinitis due to pollen: Secondary | ICD-10-CM | POA: Diagnosis not present

## 2018-01-31 DIAGNOSIS — J3089 Other allergic rhinitis: Secondary | ICD-10-CM | POA: Diagnosis not present

## 2018-02-03 DIAGNOSIS — J301 Allergic rhinitis due to pollen: Secondary | ICD-10-CM | POA: Diagnosis not present

## 2018-02-03 DIAGNOSIS — J3089 Other allergic rhinitis: Secondary | ICD-10-CM | POA: Diagnosis not present

## 2018-02-05 DIAGNOSIS — G43109 Migraine with aura, not intractable, without status migrainosus: Secondary | ICD-10-CM | POA: Insufficient documentation

## 2018-02-12 DIAGNOSIS — J3089 Other allergic rhinitis: Secondary | ICD-10-CM | POA: Diagnosis not present

## 2018-02-12 DIAGNOSIS — J301 Allergic rhinitis due to pollen: Secondary | ICD-10-CM | POA: Diagnosis not present

## 2018-02-19 ENCOUNTER — Ambulatory Visit: Payer: Medicare Other | Admitting: Family Medicine

## 2018-02-19 DIAGNOSIS — J301 Allergic rhinitis due to pollen: Secondary | ICD-10-CM | POA: Diagnosis not present

## 2018-02-19 DIAGNOSIS — J3089 Other allergic rhinitis: Secondary | ICD-10-CM | POA: Diagnosis not present

## 2018-02-24 ENCOUNTER — Ambulatory Visit: Payer: Medicare Other | Admitting: Family Medicine

## 2018-02-26 DIAGNOSIS — J3089 Other allergic rhinitis: Secondary | ICD-10-CM | POA: Diagnosis not present

## 2018-02-26 DIAGNOSIS — J301 Allergic rhinitis due to pollen: Secondary | ICD-10-CM | POA: Diagnosis not present

## 2018-02-28 DIAGNOSIS — H10413 Chronic giant papillary conjunctivitis, bilateral: Secondary | ICD-10-CM | POA: Diagnosis not present

## 2018-03-01 ENCOUNTER — Encounter: Payer: Self-pay | Admitting: Family Medicine

## 2018-03-01 ENCOUNTER — Ambulatory Visit (INDEPENDENT_AMBULATORY_CARE_PROVIDER_SITE_OTHER): Payer: Medicare Other | Admitting: Family Medicine

## 2018-03-01 VITALS — BP 116/74 | HR 76 | Temp 98.3°F | Wt 102.0 lb

## 2018-03-01 DIAGNOSIS — S161XXA Strain of muscle, fascia and tendon at neck level, initial encounter: Secondary | ICD-10-CM | POA: Diagnosis not present

## 2018-03-01 DIAGNOSIS — I6523 Occlusion and stenosis of bilateral carotid arteries: Secondary | ICD-10-CM

## 2018-03-01 MED ORDER — TIZANIDINE HCL 4 MG PO TABS: 4.0000 mg | ORAL_TABLET | Freq: Every evening | ORAL | 0 refills | Status: DC

## 2018-03-01 NOTE — Progress Notes (Signed)
[facility] [address] [place], [place] [address] Phone: [phone]  Patient ID: [name] MRN: [id number], DOB: 12-04-1950, 67 y.o. Date of Encounter: 03/01/2018  Primary Physician:  [name] Garter, MD   Chief Complaint:  Chief Complaint  Patient presents with  . Neck Pain    right side--pt reached down to pick up a cup and felt a pul in the base of right side of skull--right side back of head...difficulty turning head to right and looking up.Marland KitchenMarland Kitchenpt has tried OTC ibuprofen w/ some relief   Subjective:   History of Present Illness:  [name] is a 67 y.o. pleasant patient who presents with the following:  01/22/2017 - ? Shade, did not have a retinal detachment - this was worked up thoroughly. Carotid artery [place] was normal. Probable migraine.   Works out a lot.   01/29/2018 - had some pain in the mastoid region. Saw ENT and thought this was TMJ, and that has seemed like this has helped.   Worked out on wed, did some overhead presses and tricep presses. Yest did some weights and R lateral neck pain. She did take some motrin. Difficulty looking up and right now. She has a lot of loss of ROM currently. Yesterday reached down to pick up a cup and felt something pull at the base of her R neck.   Feels stiff all on the R.   The PMH, PSH, Social History, Family History, Medications, and allergies have been reviewed in [facility], and have been updated if relevant.  Review of Systems:  GEN: No fevers, chills. Nontoxic. Primarily MSK c/o today. MSK: Detailed in the HPI GI: tolerating PO intake without difficulty Neuro: No numbness, parasthesias, or tingling associated. Otherwise the pertinent positives of the ROS are noted above.    Objective:   Physical Examination: BP 116/74   Pulse 76   Temp 98.3 F (36.8 C) (Oral)   Wt 102 lb (46.3 kg)   SpO2 98%   BMI 18.96 kg/m    GEN: Well-developed,well-nourished,in  no acute distress; alert,appropriate and cooperative throughout examination HEENT: Normocephalic and atraumatic without obvious abnormalities. Ears, externally no deformities PULM: Breathing comfortably in no respiratory distress EXT: No clubbing, cyanosis, or edema PSYCH: Normally interactive. Cooperative during the interview. Pleasant. Friendly and conversant. Not anxious or depressed appearing. Normal, full affect.  CERVICAL SPINE EXAM Range of motion: Flexion, extension, lateral bending, and rotation: 50-60% loss of motions in all directions Pain with terminal motion: y Spinous Processes: NT SCM: NT Upper paracervical muscles: ttp - mostly on the R Upper traps: TTP on the R C5-T1 intact, sensation and motor   Laboratory and Imaging Data:  Assessment & Plan:   Neck strain, initial encounter  Cont with Ibuprofen 600 mg po tid and zanaflex at night Work on range of motion bid Heat with shower, heating pad, or hot water bottle.   Alter work-outs in the gym, but typically resolves within a few weeks  Follow-up: prn only  Meds ordered this encounter  Medications  . tiZANidine (ZANAFLEX) 4 MG tablet    Sig: Take 1 tablet (4 mg total) by mouth Nightly.    Dispense:  30 tablet    Refill:  0   Signed,  [name] T. [name] Torelli, MD   Allergies as of 03/01/2018      Reactions   Codeine Other (See Comments)   Doesn't remeber   Doxycycline Other (See Comments)   Epinephrine    Heart racing  Neomycin    Topical rash from cream   Penicillins Hives, Other (See Comments)   Has patient had a PCN reaction causing immediate rash, facial/tongue/throat swelling, SOB or lightheadedness with hypotension: Yes Has patient had a PCN reaction causing severe rash involving mucus membranes or skin necrosis: Yes Has patient had a PCN reaction that required hospitalization No Has patient had a PCN reaction occurring within the last 10 years: No If all of the above answers are "NO", then may  proceed with Cephalosporin use.   Protonix [pantoprazole Sodium]    ?HAs   Sulfonamide Derivatives Other (See Comments)   Doesn't remember    Zithromax [azithromycin] Rash      Medication List        Accurate as of 03/01/18 11:59 PM. Always use your most recent med list.          ALLEGRA ALLERGY CHILDRENS 30 MG tablet Generic drug:  fexofenadine Take 30 mg by mouth 2 (two) times daily as needed (allergies).   ALPRAZolam 0.25 MG tablet Commonly known as:  XANAX 1 po bid prn anxiety   ALREX 0.2 % Susp Generic drug:  loteprednol Apply 0.2 drops to eye as needed (allergies).   aspirin EC 81 MG tablet Take 1 tablet (81 mg total) by mouth daily.   BOTOX COSMETIC 50 units Solr Generic drug:  Botulinum Toxin Type A (Cosm) Inject 4-6 Units into the muscle. 3 month  injection   conjugated estrogens vaginal cream Commonly known as:  PREMARIN Use PV once every 2-3 days   Diclofenac Sodium 2 % Soln Place 2 application onto the skin 2 (two) times daily.   EPIPEN 2-PAK 0.3 mg/0.3 mL Soaj injection Generic drug:  EPINEPHrine See admin instructions. Reported on 10/08/2015   fluocinonide cream 0.05 % Commonly known as:  LIDEX Apply 1 application topically 2 (two) times daily.   fluticasone 50 MCG/ACT nasal spray Commonly known as:  FLONASE Place 1 spray into both nostrils as needed for allergies.   meclizine 25 MG tablet Commonly known as:  ANTIVERT Take 1 tablet (25 mg total) by mouth 3 (three) times daily as needed for dizziness.   mupirocin ointment 2 % Commonly known as:  BACTROBAN APPLY TO AFFECTED AREA TWICE A DAY FOR 7 DAYS   omeprazole 40 MG capsule Commonly known as:  PRILOSEC Take 40 mg by mouth daily.   RESTYLANE IJ Inject as directed every 6 (six) months.   tiZANidine 4 MG tablet Commonly known as:  ZANAFLEX Take 1 tablet (4 mg total) by mouth Nightly.   tretinoin 0.05 % cream Commonly known as:  RETIN-A Apply 1 application topically at bedtime.     triamcinolone cream 0.1 % Commonly known as:  KENALOG APPLY TO AFFECTED AREA 3 TIMES A DAY   Vitamin B-12 1000 MCG Subl Place 1 tablet (1,000 mcg total) under the tongue daily.

## 2018-03-01 NOTE — Progress Notes (Signed)
Neck pain

## 2018-03-02 ENCOUNTER — Encounter: Payer: Self-pay | Admitting: Family Medicine

## 2018-03-03 ENCOUNTER — Encounter: Payer: Self-pay | Admitting: Internal Medicine

## 2018-03-03 ENCOUNTER — Ambulatory Visit (INDEPENDENT_AMBULATORY_CARE_PROVIDER_SITE_OTHER): Payer: Medicare Other | Admitting: Internal Medicine

## 2018-03-03 ENCOUNTER — Other Ambulatory Visit (INDEPENDENT_AMBULATORY_CARE_PROVIDER_SITE_OTHER): Payer: Medicare Other

## 2018-03-03 DIAGNOSIS — M5481 Occipital neuralgia: Secondary | ICD-10-CM

## 2018-03-03 DIAGNOSIS — G44209 Tension-type headache, unspecified, not intractable: Secondary | ICD-10-CM | POA: Diagnosis not present

## 2018-03-03 DIAGNOSIS — Z0000 Encounter for general adult medical examination without abnormal findings: Secondary | ICD-10-CM | POA: Diagnosis not present

## 2018-03-03 DIAGNOSIS — I6523 Occlusion and stenosis of bilateral carotid arteries: Secondary | ICD-10-CM

## 2018-03-03 DIAGNOSIS — E538 Deficiency of other specified B group vitamins: Secondary | ICD-10-CM

## 2018-03-03 LAB — CBC WITH DIFFERENTIAL/PLATELET
BASOS ABS: 0.1 10*3/uL (ref 0.0–0.1)
Basophils Relative: 1.2 % (ref 0.0–3.0)
EOS ABS: 0.2 10*3/uL (ref 0.0–0.7)
Eosinophils Relative: 2.5 % (ref 0.0–5.0)
HEMATOCRIT: 38.2 % (ref 36.0–46.0)
Hemoglobin: 13.1 g/dL (ref 12.0–15.0)
LYMPHS PCT: 23.4 % (ref 12.0–46.0)
Lymphs Abs: 1.7 10*3/uL (ref 0.7–4.0)
MCHC: 34.2 g/dL (ref 30.0–36.0)
MCV: 91.4 fl (ref 78.0–100.0)
MONO ABS: 0.5 10*3/uL (ref 0.1–1.0)
Monocytes Relative: 6.7 % (ref 3.0–12.0)
NEUTROS ABS: 4.9 10*3/uL (ref 1.4–7.7)
NEUTROS PCT: 66.2 % (ref 43.0–77.0)
PLATELETS: 215 10*3/uL (ref 150.0–400.0)
RBC: 4.18 Mil/uL (ref 3.87–5.11)
RDW: 12.6 % (ref 11.5–15.5)
WBC: 7.4 10*3/uL (ref 4.0–10.5)

## 2018-03-03 LAB — BASIC METABOLIC PANEL
BUN: 19 mg/dL (ref 6–23)
CALCIUM: 9.7 mg/dL (ref 8.4–10.5)
CO2: 31 meq/L (ref 19–32)
CREATININE: 0.83 mg/dL (ref 0.40–1.20)
Chloride: 103 mEq/L (ref 96–112)
GFR: 73.25 mL/min (ref >60.00)
GLUCOSE: 105 mg/dL — AB (ref 70–99)
Potassium: 3.8 mEq/L (ref 3.5–5.1)
Sodium: 139 mEq/L (ref 135–145)

## 2018-03-03 LAB — URINALYSIS, ROUTINE W REFLEX MICROSCOPIC
Bilirubin Urine: NEGATIVE
KETONES UR: NEGATIVE
Leukocytes, UA: NEGATIVE
Nitrite: NEGATIVE
SPECIFIC GRAVITY, URINE: 1.02 (ref 1.000–1.030)
Total Protein, Urine: NEGATIVE
Urine Glucose: NEGATIVE
Urobilinogen, UA: 0.2 (ref 0.0–1.0)
pH: 5 (ref 5.0–8.0)

## 2018-03-03 LAB — TSH: TSH: 1.7 u[IU]/mL (ref 0.35–4.50)

## 2018-03-03 LAB — LIPID PANEL
Cholesterol: 204 mg/dL — ABNORMAL HIGH (ref 0–200)
HDL: 66.7 mg/dL (ref >39.00)
LDL Cholesterol: 98 mg/dL (ref 0–99)
NONHDL: 137.77
Total CHOL/HDL Ratio: 3
Triglycerides: 197 mg/dL — ABNORMAL HIGH (ref 0.0–149.0)
VLDL: 39.4 mg/dL (ref 0.0–40.0)

## 2018-03-03 LAB — HEPATIC FUNCTION PANEL
ALK PHOS: 66 U/L (ref 39–117)
ALT: 13 U/L (ref 0–35)
AST: 16 U/L (ref 0–37)
Albumin: 4.4 g/dL (ref 3.5–5.2)
Bilirubin, Direct: 0.1 mg/dL (ref 0.0–0.3)
Total Bilirubin: 0.4 mg/dL (ref 0.2–1.2)
Total Protein: 7.3 g/dL (ref 6.0–8.3)

## 2018-03-03 LAB — VITAMIN B12

## 2018-03-03 MED ORDER — METHYLPREDNISOLONE 4 MG PO TBPK: ORAL_TABLET | ORAL | 0 refills | Status: DC

## 2018-03-03 MED ORDER — VITAMIN B-12 1000 MCG SL SUBL: 1.0000 | SUBLINGUAL_TABLET | SUBLINGUAL | 3 refills | Status: DC

## 2018-03-03 NOTE — Progress Notes (Signed)
Subjective:  Patient ID: [name], female    DOB: September 11, 1950  Age: 67 y.o. MRN: [id number]  CC: No chief complaint on file.   HPI [name] presents for B 12 def  C/o pain on the R occip area for several days, relieved with ibuprofen  Outpatient Medications Prior to Visit  Medication Sig Dispense Refill  . ALPRAZolam (XANAX) 0.25 MG tablet 1 po bid prn anxiety 30 tablet 1  . ALREX 0.2 % SUSP Apply 0.2 drops to eye as needed (allergies).     [name] aspirin EC 81 MG tablet Take 1 tablet (81 mg total) by mouth daily. 100 tablet 3  . Botulinum Toxin Type A, Cosm, (BOTOX COSMETIC) 50 UNITS SOLR Inject 4-6 Units into the muscle. 3 month  injection    . conjugated estrogens (PREMARIN) vaginal cream Use PV once every 2-3 days 90 g 2  . Cyanocobalamin (VITAMIN B-12) 1000 MCG SUBL Place 1 tablet (1,000 mcg total) under the tongue daily. 100 tablet 3  . Diclofenac Sodium (PENNSAID) 2 % SOLN Place 2 application onto the skin 2 (two) times daily. 112 g 3  . EPIPEN 2-PAK 0.3 MG/0.3ML SOAJ injection See admin instructions. Reported on 10/08/2015  1  . fexofenadine (ALLEGRA ALLERGY CHILDRENS) 30 MG tablet Take 30 mg by mouth 2 (two) times daily as needed (allergies).    . fluocinonide cream (LIDEX) 9.83 % Apply 1 application topically 2 (two) times daily. 90 g 2  . fluticasone (FLONASE) 50 MCG/ACT nasal spray Place 1 spray into both nostrils as needed for allergies.   5  . Hyaluronic Acid (RESTYLANE IJ) Inject as directed every 6 (six) months.    . meclizine (ANTIVERT) 25 MG tablet Take 1 tablet (25 mg total) by mouth 3 (three) times daily as needed for dizziness. 30 tablet 0  . mupirocin ointment (BACTROBAN) 2 % APPLY TO AFFECTED AREA TWICE A DAY FOR 7 DAYS  0  . omeprazole (PRILOSEC) 40 MG capsule Take 40 mg by mouth daily.    [name] tiZANidine (ZANAFLEX) 4 MG tablet Take 1 tablet (4 mg total) by mouth Nightly. 30 tablet 0  . tretinoin (RETIN-A) 0.05 % cream Apply 1 application topically at  bedtime.    . triamcinolone cream (KENALOG) 0.1 % APPLY TO AFFECTED AREA 3 TIMES A DAY 30 g 0   No facility-administered medications prior to visit.     ROS: Review of Systems  Constitutional: Negative for activity change, appetite change, chills, fatigue and unexpected weight change.  HENT: Negative for congestion, mouth sores and sinus pressure.   Eyes: Negative for visual disturbance.  Respiratory: Negative for cough and chest tightness.   Gastrointestinal: Negative for abdominal pain and nausea.  Genitourinary: Negative for difficulty urinating, frequency and vaginal pain.  Musculoskeletal: Positive for neck pain. Negative for back pain and gait problem.  Skin: Negative for pallor and rash.  Neurological: Positive for headaches. Negative for dizziness, tremors, weakness and numbness.  Psychiatric/Behavioral: Negative for confusion and sleep disturbance.    Objective:  BP 120/72 (BP Location: Left Arm, Patient Position: Sitting, Cuff Size: Normal)   Pulse 69   Temp 98.9 F (37.2 C) (Oral)   Ht 5' 1.5" (1.562 m)   Wt 102 lb (46.3 kg)   SpO2 97%   BMI 18.96 kg/m   BP Readings from Last 3 Encounters:  03/03/18 120/72  03/01/18 116/74  01/27/18 118/70    Wt Readings from Last 3 Encounters:  03/03/18 102 lb (46.3 kg)  03/01/18  102 lb (46.3 kg)  01/27/18 101 lb (45.8 kg)    Physical Exam  Constitutional: She appears well-developed. No distress.  HENT:  Head: Normocephalic.  Right Ear: External ear normal.  Left Ear: External ear normal.  Nose: Nose normal.  Mouth/Throat: Oropharynx is clear and moist.  Eyes: Pupils are equal, round, and reactive to light. Conjunctivae are normal. Right eye exhibits no discharge. Left eye exhibits no discharge.  Neck: Normal [name] of motion. Neck supple. No JVD present. No tracheal deviation present. No thyromegaly present.  Cardiovascular: Normal rate, regular rhythm and normal heart sounds.  Pulmonary/Chest: No stridor. No  respiratory distress. She has no wheezes.  Abdominal: Soft. Bowel sounds are normal. She exhibits no distension and no mass. There is no tenderness. There is no rebound and no guarding.  Musculoskeletal: She exhibits tenderness. She exhibits no edema.  Lymphadenopathy:    She has no cervical adenopathy.  Neurological: She displays normal reflexes. No cranial nerve deficit. She exhibits normal muscle tone. Coordination normal.  Skin: No rash noted. No erythema.  Psychiatric: She has a normal mood and affect. Her behavior is normal. Judgment and thought content normal.  Of the right occipital skull is sensitive to palpation.  The area of right occipital nerve exit is tender.  Neck muscles are nontender.  Neck with full [name] of motion  Lab Results  Component Value Date   WBC 4.4 01/27/2018   HGB 12.8 01/27/2018   HCT 37.4 01/27/2018   PLT 192.0 01/27/2018   GLUCOSE 95 01/27/2018   CHOL 219 (H) 01/31/2017   TRIG 78.0 01/31/2017   HDL 74.60 01/31/2017   LDLCALC 128 (H) 01/31/2017   ALT 11 09/12/2016   AST 14 09/12/2016   NA 139 01/27/2018   K 4.0 01/27/2018   CL 104 01/27/2018   CREATININE 0.86 01/27/2018   BUN 15 01/27/2018   CO2 30 01/27/2018   TSH 1.29 01/27/2018    No results found.  Assessment & Plan:   There are no diagnoses linked to this encounter.   No orders of the defined types were placed in this encounter.    Follow-up: No follow-ups on file.  [name], MD

## 2018-03-03 NOTE — Assessment & Plan Note (Addendum)
Discussed-info provided Medrol dose pack  rice bag Occipital nerve block options discussed.

## 2018-03-03 NOTE — Assessment & Plan Note (Signed)
Borderline - on B12 Labs

## 2018-03-03 NOTE — Patient Instructions (Signed)
Occipital Neuralgia Occipital neuralgia is a type of headache that causes episodes of very bad pain in the back of your head. Pain from occipital neuralgia may spread (radiate) to other parts of your head. The pain is usually brief and often goes away after you rest and relax. These headaches may be caused by irritation of the nerves that leave your spinal cord high up in your neck, just below the base of your skull (occipital nerves). Your occipital nerves transmit sensations from the back of your head, the top of your head, and the areas behind your ears. What are the causes? Occipital neuralgia can occur without any known cause (primary headache syndrome). In other cases, occipital neuralgia is caused by pressure on or irritation of one of the two occipital nerves. Causes of occipital nerve compression or irritation include:  Wear and tear of the vertebrae in the neck (osteoarthritis).  Neck injury.  Disease of the disks that separate the vertebrae.  Tumors.  Gout.  Infections.  Diabetes.  Swollen blood vessels that put pressure on the occipital nerves.  Muscle spasm in the neck.  What are the signs or symptoms? Pain is the main symptom of occipital neuralgia. It usually starts in the back of the head but may also be felt in other areas supplied by the occipital nerves. Pain is usually on one side but may be on both sides. You may have:  Brief episodes of very bad pain that is burning, stabbing, shocking, or shooting.  Pain behind the eye.  Pain triggered by neck movement or hair brushing.  Scalp tenderness.  Aching in the back of the head between episodes of very bad pain.  How is this diagnosed? Your health care provider may diagnose occipital neuralgia based on your symptoms and a physical exam. During the exam, the health care provider may push on areas supplied by the occipital nerves to see if they are painful. Some tests may also be done to help in making the  diagnosis. These may include:  Imaging studies of the upper spinal cord, such as an MRI or CT scan. These may show compression or spinal cord abnormalities.  Nerve block. You will get an injection of numbing medicine (local anesthetic) near the occipital nerve to see if this relieves pain.  How is this treated? Treatment may begin with simple measures, such as:  Rest.  Massage.  Heat.  Over-the-counter pain relievers.  If these measures do not work, you may need other treatments, including:  Medicines such as: ? Prescription-strength anti-inflammatory medicines. ? Muscle relaxants. ? Antiseizure medicines. ? Antidepressants.  Steroid injection. This involves injections of local anesthetic and strong anti-inflammatory drugs (steroids).  Pulsed radiofrequency. Wires are implanted to deliver electrical impulses that block pain signals from the occipital nerve.  Physical therapy.  Surgery to relieve nerve pressure.  Follow these instructions at home:  Take all medicines as directed by your health care provider.  Avoid activities that cause pain.  Rest when you have an attack of pain.  Try gentle massage or a heating pad to relieve pain.  Work with a physical therapist to learn stretching exercises you can do at home.  Try a different pillow or sleeping position.  Practice good posture.  Try to stay active. Get regular exercise that does not cause pain. Ask your health care provider to suggest safe exercises for you.  Keep all follow-up visits as directed by your health care provider. This is important. Contact a health care provider if:    Your medicine is not working.  You have new or worsening symptoms. Get help right away if:  You have very bad head pain that is not going away.  You have a sudden change in vision, balance, or speech. This information is not intended to replace advice given to you by your health care provider. Make sure you discuss any  questions you have with your health care provider. Document Released: 05/29/2001 Document Revised: 11/10/2015 Document Reviewed: 05/27/2013 Elsevier Interactive Patient Education  2017 [employer]  

## 2018-03-03 NOTE — Assessment & Plan Note (Signed)
Better  

## 2018-03-05 NOTE — Telephone Encounter (Signed)
Pt called to check status of question. Please advise

## 2018-03-05 NOTE — Telephone Encounter (Signed)
Please advise 

## 2018-03-06 DIAGNOSIS — J301 Allergic rhinitis due to pollen: Secondary | ICD-10-CM | POA: Diagnosis not present

## 2018-03-06 DIAGNOSIS — J3089 Other allergic rhinitis: Secondary | ICD-10-CM | POA: Diagnosis not present

## 2018-03-08 IMAGING — DX DG LUMBAR SPINE 2-3V
3 series · 3 of 3 positions shown · non-contrast
Comparison: None.

CLINICAL DATA: Lower back pain right>left x 3 weeks. No known
injury.

EXAM:
LUMBAR SPINE - 2-3 VIEW

[l-spine ap]
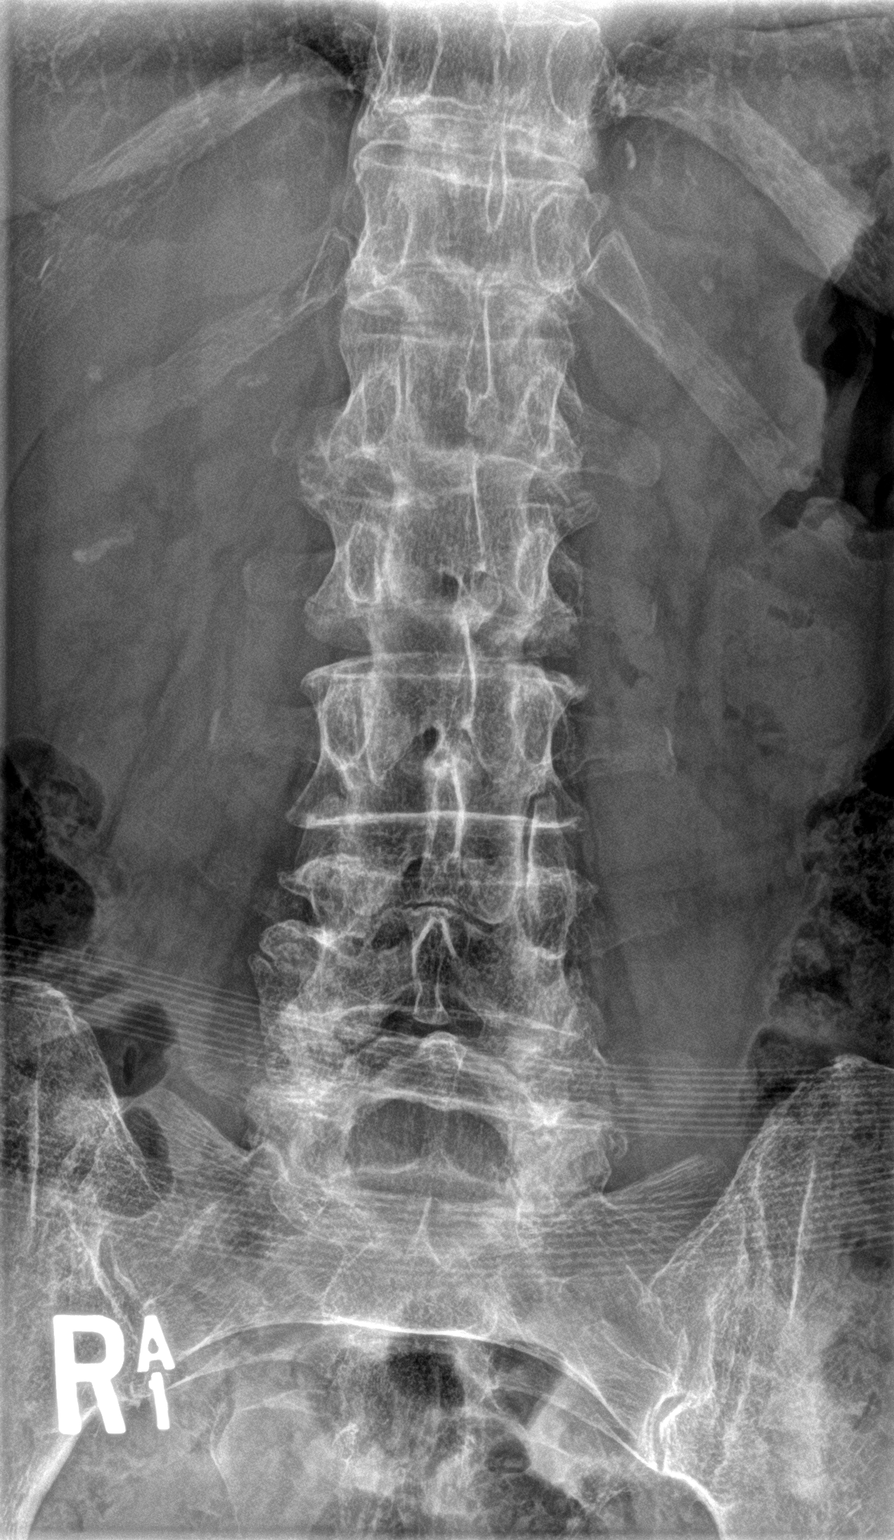

[l-spine lat]
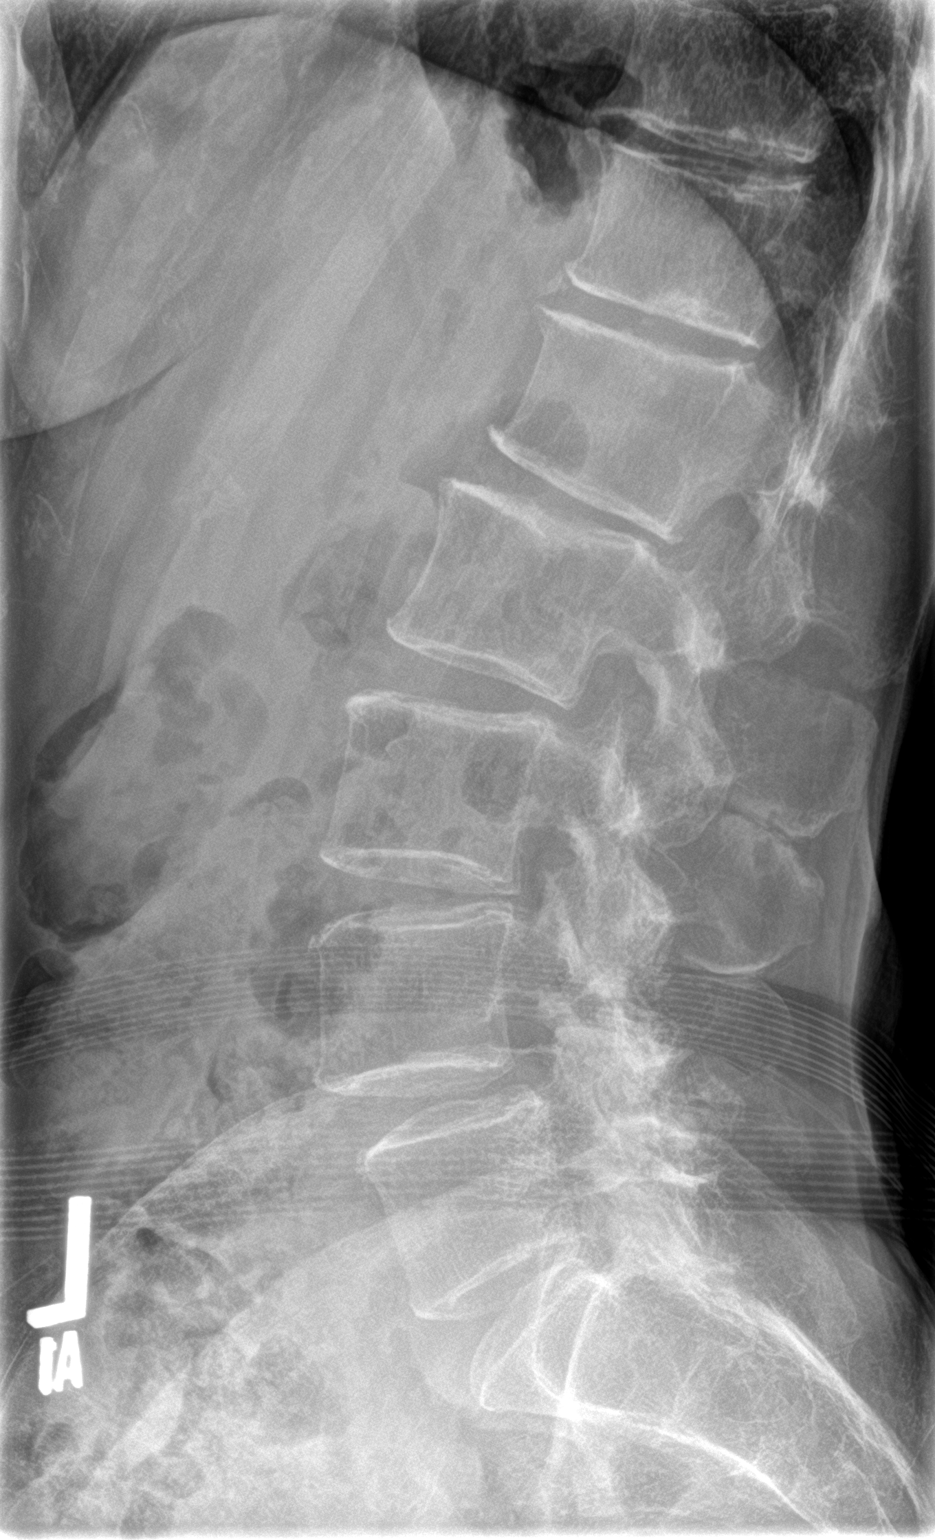

[l-spine spot]
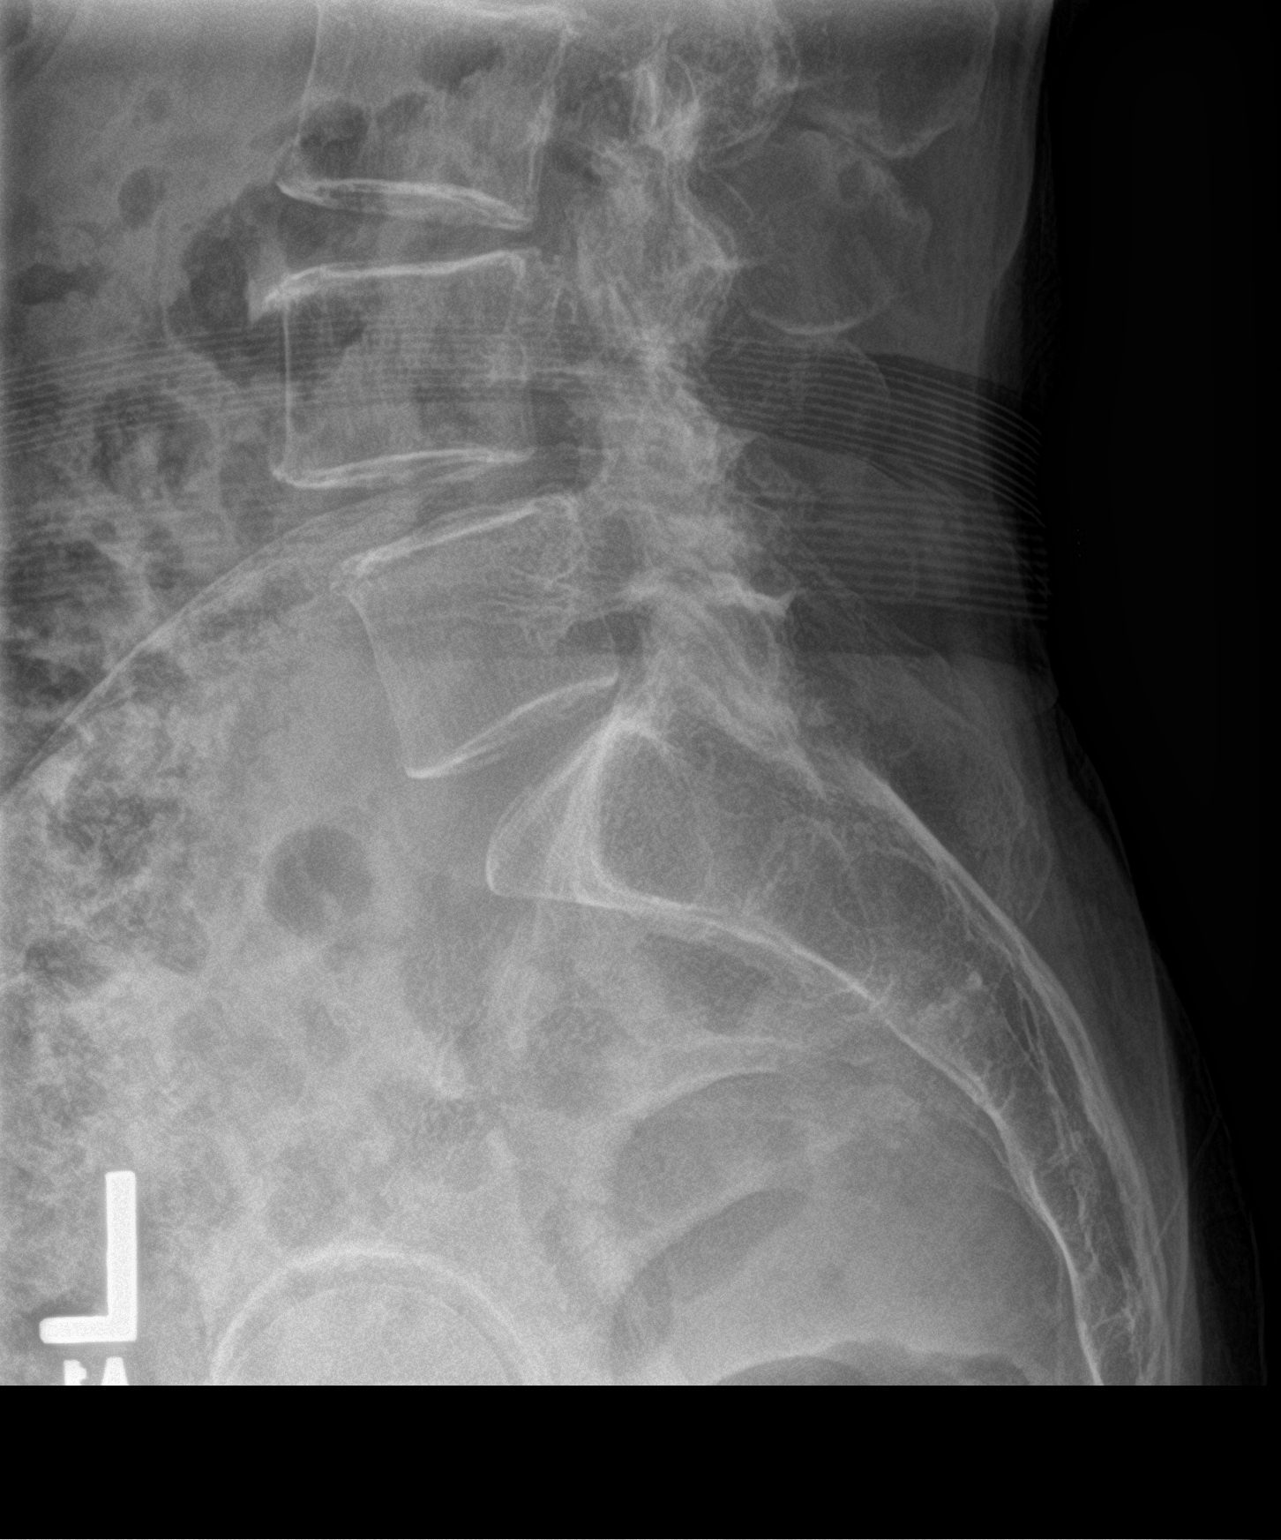

[3 of 3 positions shown; findings below may reference images not displayed]

FINDINGS: Mild disc desiccations at the T12-L1 and L1-L2 levels, with
associated mild disc space narrowings and minimal osseous spurring.
Additional degenerative facet hypertrophy within the lower lumbar
spine, mild to moderate in degree. Degenerative changes of the
abutting spinous processes in the mid and lower lumbar spine,
suggesting Baastrup syndrome.

Minimal anterolisthesis of L4, likely related to the underlying
degenerative changes. No evidence of acute vertebral body
subluxation. No acute or suspicious osseous lesion. No fracture line
or displaced fracture fragment. Visualized paravertebral soft
tissues are unremarkable.
IMPRESSION: 1. No acute findings.
2. Degenerative changes of the lumbar spine, mild to moderate in
degree, as detailed above.
3. Degenerative changes of the abutting spinous processes in the mid
and lower lumbar spine, suggesting Baastrup syndrome.

## 2018-03-11 ENCOUNTER — Ambulatory Visit: Payer: Medicare Other | Admitting: Internal Medicine

## 2018-03-12 NOTE — Progress Notes (Signed)
[name] [place], [place] [address] Phone: [phone] Subjective:    I [name] am serving as a Education administrator for Dr. Hulan Saas.   I'm seeing this patient by the request  of:    CC: Neck pain  [id number]  [name] is a 67 y.o. female coming in with complaint of neck pain. Pain comes and goes. Wants exercise and suggestions for her neck so that the pain doesn't get any worse.  Location- [place]  Character-dull, throbbing Aggravating factors- Reliving factors-  Therapies tried- heat, oral meds Severity-7 out of 10   Recent set of x-rays since June 16, 2018  Past Medical History:  Diagnosis Date  . Allergic rhinitis, cause unspecified   . Backache, unspecified   . Benign fasciculation-cramp syndrome 12/03/2012    Worsening with  stress, fatigue .  [name] Calculus of kidney   . Conversion disorder   . Cotton wool spots   . Esophageal reflux   . Floater, vitreous, left 05/2016  . GERD (gastroesophageal reflux disease)   . Hemorrhage of rectum and anus   . Internal hemorrhoids without mention of complication   . Irritable bowel syndrome   . Microscopic hematuria   . Migraine headache with aura   . Mild aortic insufficiency   . Mild tricuspid regurgitation   . Osteopenia   . Other plastic surgery for unacceptable cosmetic appearance    Inj tx fllers/expander  . Personal history of urinary calculi   . Postmenopausal atrophic vaginitis   . Premature atrial contractions   . PVC (premature ventricular contraction)    a. Event monitor 2013.  [name] Unspecified constipation   . Vasovagal syncope    Past Surgical History:  Procedure Laterality Date  . BREAST BIOPSY Right   . Cosmetic Procedures w/Injection therapy    . SKIN CANCER EXCISION Left    thigh  . WISDOM TOOTH EXTRACTION     Social History   Socioeconomic History  . Marital status: Married    Spouse name: [name]  . Number of children: 0  . Years of  education: 27  . Highest education level: Not on file  Occupational History  . Occupation: OFFICE liasion    Employer: [employer]  . Occupation: Engineer, structural  . Occupation: OFFICE [employer]    Employer: [employer]  Social Needs  . Financial resource strain: Not on file  . Food insecurity:    Worry: Not on file    Inability: Not on file  . Transportation needs:    Medical: Not on file    Non-medical: Not on file  Tobacco Use  . Smoking status: Never Smoker  . Smokeless tobacco: Never Used  Substance and Sexual Activity  . Alcohol use: No  . Drug use: No  . Sexual activity: Not on file  Lifestyle  . Physical activity:    Days per week: Not on file    Minutes per session: Not on file  . Stress: Not on file  Relationships  . Social connections:    Talks on phone: Not on file    Gets together: Not on file    Attends religious service: Not on file    Active member of club or organization: Not on file    Attends meetings of clubs or organizations: Not on file    Relationship status: Not on file  Other Topics Concern  . Not on file  Social History Narrative   Vicational  School in [place]. Married '90 - marriage is in very good shape '12, No children.  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 Offerred medical services for them if needed (Nov '11)   Patient is married [name]) and lives at home with her husband.   Patient is working Administrator, arts work.   Patient has a high school education.   Patient is right-handed.   Patient does not drink any caffeine.         Allergies  Allergen Reactions  . Codeine Other (See Comments)    Doesn't remeber  . Doxycycline Other (See Comments)  . Epinephrine     Heart racing   . Neomycin     Topical rash from cream  . Penicillins Hives and Other (See Comments)    Has patient had a PCN reaction causing immediate rash,  facial/tongue/throat swelling, SOB or lightheadedness with hypotension: Yes Has patient had a PCN reaction causing severe rash involving mucus membranes or skin necrosis: Yes Has patient had a PCN reaction that required hospitalization No Has patient had a PCN reaction occurring within the last 10 years: No If all of the above answers are "NO", then may proceed with Cephalosporin use.   . Protonix [Pantoprazole Sodium]     ?HAs  . Sulfonamide Derivatives Other (See Comments)    Doesn't remember   . Zithromax [Azithromycin] Rash   Family History  Problem Relation Age of Onset  . Hyperlipidemia Father   . COPD Father        Smoker, deceased June 28, 2014  . Hypertension Father   . Hyperlipidemia Mother   . Hypertension Mother   . Diabetes Neg Hx   . Colon cancer Neg Hx   . Breast cancer Neg Hx   . Coronary artery disease Neg Hx   . Cancer Neg Hx        breast or colon    Current Outpatient Medications (Endocrine & Metabolic):  .  methylPREDNISolone (MEDROL DOSEPAK) 4 MG TBPK tablet, As directed  Current Outpatient Medications (Cardiovascular):  [name]  EPIPEN 2-PAK 0.3 MG/0.3ML SOAJ injection, See admin instructions. Reported on 10/08/2015  Current Outpatient Medications (Respiratory):  .  fexofenadine (ALLEGRA ALLERGY CHILDRENS) 30 MG tablet, Take 30 mg by mouth 2 (two) times daily as needed (allergies). .  fluticasone (FLONASE) 50 MCG/ACT nasal spray, Place 1 spray into both nostrils as needed for allergies.   Current Outpatient Medications (Analgesics):  .  aspirin EC 81 MG tablet, Take 1 tablet (81 mg total) by mouth daily.  Current Outpatient Medications (Hematological):  [name]  Cyanocobalamin (VITAMIN B-12) 1000 MCG SUBL, Place 1 tablet (1,000 mcg total) under the tongue every other day.  Current Outpatient Medications (Other):  [name]  ALPRAZolam (XANAX) 0.25 MG tablet, 1 po bid prn anxiety .  ALREX 0.2 % SUSP, Apply 0.2 drops to eye as needed (allergies).  .  Botulinum Toxin Type A,  Cosm, (BOTOX COSMETIC) 50 UNITS SOLR, Inject 4-6 Units into the muscle. 3 month  injection .  conjugated estrogens (PREMARIN) vaginal cream, Use PV once every 2-3 days .  Diclofenac Sodium (PENNSAID) 2 % SOLN, Place 2 application onto the skin 2 (two) times daily. .  fluocinonide cream (LIDEX) 4.69 %, Apply 1 application topically 2 (two) times daily. [name]  Hyaluronic Acid (RESTYLANE IJ), Inject as directed every 6 (six) months. .  meclizine (ANTIVERT) 25 MG tablet, Take 1 tablet (25 mg total)  by mouth 3 (three) times daily as needed for dizziness. .  mupirocin ointment (BACTROBAN) 2 %, APPLY TO AFFECTED AREA TWICE A DAY FOR 7 DAYS .  omeprazole (PRILOSEC) 40 MG capsule, Take 40 mg by mouth daily. [name]  tiZANidine (ZANAFLEX) 4 MG tablet, Take 1 tablet (4 mg total) by mouth Nightly. .  tretinoin (RETIN-A) 0.05 % cream, Apply 1 application topically at bedtime. .  triamcinolone cream (KENALOG) 0.1 %, APPLY TO AFFECTED AREA 3 TIMES A DAY    Past medical history, social, surgical and family history all reviewed in electronic medical record.  No pertanent information unless stated regarding to the chief complaint.   Review of Systems:  No headache, visual changes, nausea, vomiting, diarrhea, constipation, dizziness, abdominal pain, skin rash, fevers, chills, night sweats, weight loss, swollen lymph nodes, body aches, joint swelling, muscle aches, chest pain, shortness of breath, mood changes.   Objective  Blood pressure 100/70, pulse 69, height 5\' 1"  (1.549 m), weight 103 lb (46.7 kg), SpO2 98 %.    General: No apparent distress alert and oriented x3 mood and affect normal, dressed appropriately.  HEENT: Pupils equal, extraocular movements intact  Respiratory: Patient's speak in full sentences and does not appear short of breath  Cardiovascular: No lower extremity edema, non tender, no erythema  Skin: Warm dry intact with no signs of infection or rash on extremities or on axial skeleton.  Abdomen:  Soft nontender  Neuro: Cranial nerves II through XII are intact, neurovascularly intact in all extremities with 2+ DTRs and 2+ pulses.  Lymph: No lymphadenopathy of posterior or anterior cervical chain or axillae bilaterally.  Gait normal with good balance and coordination.  MSK:  Non tender with full range of motion and good stability and symmetric strength and tone of shoulders, elbows, wrist, hip, knee and ankles bilaterally.  Mild changes Neck: Inspection loss of lordosis. No palpable stepoffs. Negative Spurling's maneuver. Loss of extension by 10 degrees.  Worsening pain at the occiput as well as the Grip strength and sensation normal in bilateral hands Strength good C4 to T1 distribution No sensory change to C4 to T1 Negative Hoffman sign bilaterally Reflexes normal Severe tightness in the right trapezius area  [id number]; 15 additional minutes spent for Therapeutic exercises as stated in above notes.  This included exercises focusing on stretching, strengthening, with significant focus on eccentric aspects.   Long term goals include an improvement in range of motion, strength, endurance as well as avoiding reinjury. Patient's frequency would include in 1-2 times a day, 3-5 times a week for a duration of 6-12 weeks. Exercises that included:  Basic scapular stabilization to include adduction and depression of scapula Scaption, focusing on proper movement and good control Internal and External rotation utilizing a theraband, with elbow tucked at side entire time Rows with theraband   Proper technique shown and discussed handout in great detail with ATC.  All questions were discussed and answered. \    Impression and Recommendations:     This case required medical decision making of moderate complexity. The above documentation has been reviewed and is accurate and complete Lyndal Pulley, DO       Note: This dictation was prepared with Dragon dictation along with smaller phrase  technology. Any transcriptional errors that result from this process are unintentional.

## 2018-03-13 ENCOUNTER — Ambulatory Visit (INDEPENDENT_AMBULATORY_CARE_PROVIDER_SITE_OTHER): Payer: Medicare Other | Admitting: Family Medicine

## 2018-03-13 ENCOUNTER — Other Ambulatory Visit: Payer: Self-pay | Admitting: Family Medicine

## 2018-03-13 ENCOUNTER — Ambulatory Visit: Payer: Medicare Other | Admitting: Family Medicine

## 2018-03-13 ENCOUNTER — Encounter

## 2018-03-13 ENCOUNTER — Encounter: Payer: Self-pay | Admitting: Family Medicine

## 2018-03-13 ENCOUNTER — Ambulatory Visit (INDEPENDENT_AMBULATORY_CARE_PROVIDER_SITE_OTHER)
Admission: RE | Admit: 2018-03-13 | Discharge: 2018-03-13 | Disposition: A | Discharge status: Home / Self Care | Payer: Medicare Other | Source: Ambulatory Visit | Attending: Family Medicine | Admitting: Family Medicine

## 2018-03-13 VITALS — BP 100/70 | HR 69 | Ht 61.0 in | Wt 103.0 lb

## 2018-03-13 DIAGNOSIS — G8929 Other chronic pain: Secondary | ICD-10-CM | POA: Diagnosis not present

## 2018-03-13 DIAGNOSIS — I6523 Occlusion and stenosis of bilateral carotid arteries: Secondary | ICD-10-CM

## 2018-03-13 DIAGNOSIS — M542 Cervicalgia: Secondary | ICD-10-CM | POA: Diagnosis not present

## 2018-03-13 DIAGNOSIS — M47812 Spondylosis without myelopathy or radiculopathy, cervical region: Secondary | ICD-10-CM | POA: Diagnosis not present

## 2018-03-13 DIAGNOSIS — M5010 Cervical disc disorder with radiculopathy, unspecified cervical region: Secondary | ICD-10-CM

## 2018-03-13 NOTE — Patient Instructions (Addendum)
Good to see you  Ice 20 minutes 2 times daily. Usually after activity and before bed. Exercises 3 times a week.  Keep hands within peripheral vision  Bring bar down with you when doing pull downs  2 tennis ball in a tube sock and lay on it where head meets neck  Xrays downstairs Have [name] Agar do more isometric exercsies for core (planks, and even mountain climbers would be good) zanaflex at night for 3 nights Add B6 100mg  to the B12 See me again in 3 weeks and if stil not perfect we will discuss trigger point injections

## 2018-03-13 NOTE — Assessment & Plan Note (Signed)
Patient does have known degenerative disc disease.  Concern the patient is having exacerbation.  Trigger points noted in the right trapezius area as well.  Patient declined any type of injection.  Home exercises given, patient has had laboratory work-up without any significant findings.  Repeat x-rays since patient had x-rays 3 years ago.  Follow-up again in 4 to 8 weeks

## 2018-03-14 DIAGNOSIS — J3089 Other allergic rhinitis: Secondary | ICD-10-CM | POA: Diagnosis not present

## 2018-03-14 DIAGNOSIS — J301 Allergic rhinitis due to pollen: Secondary | ICD-10-CM | POA: Diagnosis not present

## 2018-03-17 DIAGNOSIS — J301 Allergic rhinitis due to pollen: Secondary | ICD-10-CM | POA: Diagnosis not present

## 2018-03-17 DIAGNOSIS — Z1231 Encounter for screening mammogram for malignant neoplasm of breast: Secondary | ICD-10-CM | POA: Diagnosis not present

## 2018-03-17 DIAGNOSIS — J3089 Other allergic rhinitis: Secondary | ICD-10-CM | POA: Diagnosis not present

## 2018-03-17 LAB — HM MAMMOGRAPHY

## 2018-03-20 ENCOUNTER — Other Ambulatory Visit: Payer: Self-pay | Admitting: Internal Medicine

## 2018-03-25 ENCOUNTER — Other Ambulatory Visit: Payer: Self-pay | Admitting: Family Medicine

## 2018-03-25 NOTE — Telephone Encounter (Signed)
Last office visit 03/13/2018 with Dr. Charlann Boxer for chronic neck pain.  Last refilled 03/01/2018 for #30 with no refills by Dr. Frederico Hamman Copland.   Ok to refill?

## 2018-03-27 DIAGNOSIS — L309 Dermatitis, unspecified: Secondary | ICD-10-CM | POA: Diagnosis not present

## 2018-03-27 DIAGNOSIS — J301 Allergic rhinitis due to pollen: Secondary | ICD-10-CM | POA: Diagnosis not present

## 2018-03-27 DIAGNOSIS — J3089 Other allergic rhinitis: Secondary | ICD-10-CM | POA: Diagnosis not present

## 2018-03-28 ENCOUNTER — Encounter: Payer: Self-pay | Admitting: Internal Medicine

## 2018-03-31 DIAGNOSIS — J301 Allergic rhinitis due to pollen: Secondary | ICD-10-CM | POA: Diagnosis not present

## 2018-03-31 DIAGNOSIS — J3089 Other allergic rhinitis: Secondary | ICD-10-CM | POA: Diagnosis not present

## 2018-04-01 NOTE — Progress Notes (Deleted)
[name] [place], [place] [address] Phone: [phone] Subjective:    I'm seeing this patient by the request  of:    CC:   [id number]  Evonda Enge is a 67 y.o. female coming in with complaint of ***  Onset-  Location Duration-  Character- Aggravating factors- Reliving factors-  Therapies tried-  Severity-     Past Medical History:  Diagnosis Date  . Allergic rhinitis, cause unspecified   . Backache, unspecified   . Benign fasciculation-cramp syndrome 12/03/2012    Worsening with  stress, fatigue .  [name] Calculus of kidney   . Conversion disorder   . Cotton wool spots   . Esophageal reflux   . Floater, vitreous, left 05/2016  . GERD (gastroesophageal reflux disease)   . Hemorrhage of rectum and anus   . Internal hemorrhoids without mention of complication   . Irritable bowel syndrome   . Microscopic hematuria   . Migraine headache with aura   . Mild aortic insufficiency   . Mild tricuspid regurgitation   . Osteopenia   . Other plastic surgery for unacceptable cosmetic appearance    Inj tx fllers/expander  . Personal history of urinary calculi   . Postmenopausal atrophic vaginitis   . Premature atrial contractions   . PVC (premature ventricular contraction)    a. Event monitor 2013.  [name] Unspecified constipation   . Vasovagal syncope    Past Surgical History:  Procedure Laterality Date  . BREAST BIOPSY Right   . Cosmetic Procedures w/Injection therapy    . SKIN CANCER EXCISION Left    thigh  . WISDOM TOOTH EXTRACTION     Social History   Socioeconomic History  . Marital status: Married    Spouse name: [name]  . Number of children: 0  . Years of education: 10  . Highest education level: Not on file  Occupational History  . Occupation: OFFICE liasion    Employer: [employer]  . Occupation: Engineer, structural  . Occupation: OFFICE [employer]    Employer: [employer]  Social Needs    . Financial resource strain: Not on file  . Food insecurity:    Worry: Not on file    Inability: Not on file  . Transportation needs:    Medical: Not on file    Non-medical: Not on file  Tobacco Use  . Smoking status: Never Smoker  . Smokeless tobacco: Never Used  Substance and Sexual Activity  . Alcohol use: No  . Drug use: No  . Sexual activity: Not on file  Lifestyle  . Physical activity:    Days per week: Not on file    Minutes per session: Not on file  . Stress: Not on file  Relationships  . Social connections:    Talks on phone: Not on file    Gets together: Not on file    Attends religious service: Not on file    Active member of club or organization: Not on file    Attends meetings of clubs or organizations: Not on file    Relationship status: Not on file  Other Topics Concern  . Not on file  Social History Narrative   [employer]  in [place]. Married '90 - marriage is in very good shape '12, No children.  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 Offerred medical services for them if needed (  Nov '11)   Patient is married [name]) and lives at home with her husband.   Patient is working Administrator, arts work.   Patient has a high school education.   Patient is right-handed.   Patient does not drink any caffeine.         Allergies  Allergen Reactions  . Codeine Other (See Comments)    Doesn't remeber  . Doxycycline Other (See Comments)  . Epinephrine     Heart racing   . Neomycin     Topical rash from cream  . Penicillins Hives and Other (See Comments)    Has patient had a PCN reaction causing immediate rash, facial/tongue/throat swelling, SOB or lightheadedness with hypotension: Yes Has patient had a PCN reaction causing severe rash involving mucus membranes or skin necrosis: Yes Has patient had a PCN reaction that required hospitalization No Has patient had a PCN  reaction occurring within the last 10 years: No If all of the above answers are "NO", then may proceed with Cephalosporin use.   . Protonix [Pantoprazole Sodium]     ?HAs  . Sulfonamide Derivatives Other (See Comments)    Doesn't remember   . Zithromax [Azithromycin] Rash   Family History  Problem Relation Age of Onset  . Hyperlipidemia Father   . COPD Father        Smoker, deceased 24-Jun-2014  . Hypertension Father   . Hyperlipidemia Mother   . Hypertension Mother   . Diabetes Neg Hx   . Colon cancer Neg Hx   . Breast cancer Neg Hx   . Coronary artery disease Neg Hx   . Cancer Neg Hx        breast or colon    Current Outpatient Medications (Endocrine & Metabolic):  .  methylPREDNISolone (MEDROL DOSEPAK) 4 MG TBPK tablet, As directed  Current Outpatient Medications (Cardiovascular):  [name]  EPIPEN 2-PAK 0.3 MG/0.3ML SOAJ injection, See admin instructions. Reported on 10/08/2015  Current Outpatient Medications (Respiratory):  .  fexofenadine (ALLEGRA ALLERGY CHILDRENS) 30 MG tablet, Take 30 mg by mouth 2 (two) times daily as needed (allergies). .  fluticasone (FLONASE) 50 MCG/ACT nasal spray, Place 1 spray into both nostrils as needed for allergies.   Current Outpatient Medications (Analgesics):  .  aspirin EC 81 MG tablet, Take 1 tablet (81 mg total) by mouth daily.  Current Outpatient Medications (Hematological):  [name]  Cyanocobalamin (VITAMIN B-12) 1000 MCG SUBL, Place 1 tablet (1,000 mcg total) under the tongue every other day.  Current Outpatient Medications (Other):  [name]  ALPRAZolam (XANAX) 0.25 MG tablet, 1 po bid prn anxiety .  ALREX 0.2 % SUSP, Apply 0.2 drops to eye as needed (allergies).  .  Botulinum Toxin Type A, Cosm, (BOTOX COSMETIC) 50 UNITS SOLR, Inject 4-6 Units into the muscle. 3 month  injection .  conjugated estrogens (PREMARIN) vaginal cream, Use PV once every 2-3 days .  Diclofenac Sodium (PENNSAID) 2 % SOLN, Place 2 application onto the skin 2 (two) times  daily. .  fluocinonide cream (LIDEX) 6.73 %, Apply 1 application topically 2 (two) times daily. [name]  Hyaluronic Acid (RESTYLANE IJ), Inject as directed every 6 (six) months. .  meclizine (ANTIVERT) 25 MG tablet, Take 1 tablet (25 mg total) by mouth 3 (three) times daily as needed for dizziness. .  mupirocin ointment (BACTROBAN) 2 %, APPLY TO AFFECTED AREA TWICE A DAY FOR 7 DAYS .  omeprazole (PRILOSEC) 40 MG capsule, Take 40 mg by mouth daily. [name]  omeprazole (PRILOSEC) 40  MG capsule, TAKE 1 CAPSULE BY MOUTH EVERY DAY .  tiZANidine (ZANAFLEX) 4 MG tablet, TAKE 1 TABLET BY MOUTH EVERY DAY IN THE EVENING .  tretinoin (RETIN-A) 0.05 % cream, Apply 1 application topically at bedtime. .  triamcinolone cream (KENALOG) 0.1 %, APPLY TO AFFECTED AREA 3 TIMES A DAY    Past medical history, social, surgical and family history all reviewed in electronic medical record.  No pertanent information unless stated regarding to the chief complaint.   Review of Systems:  No headache, visual changes, nausea, vomiting, diarrhea, constipation, dizziness, abdominal pain, skin rash, fevers, chills, night sweats, weight loss, swollen lymph nodes, body aches, joint swelling, muscle aches, chest pain, shortness of breath, mood changes.   Objective  There were no vitals taken for this visit. Systems examined below as of    General: No apparent distress alert and oriented x3 mood and affect normal, dressed appropriately.  HEENT: Pupils equal, extraocular movements intact  Respiratory: Patient's speak in full sentences and does not appear short of breath  Cardiovascular: No lower extremity edema, non tender, no erythema  Skin: Warm dry intact with no signs of infection or rash on extremities or on axial skeleton.  Abdomen: Soft nontender  Neuro: Cranial nerves II through XII are intact, neurovascularly intact in all extremities with 2+ DTRs and 2+ pulses.  Lymph: No lymphadenopathy of posterior or anterior cervical chain  or axillae bilaterally.  Gait normal with good balance and coordination.  MSK:  Non tender with full range of motion and good stability and symmetric strength and tone of shoulders, elbows, wrist, hip, knee and ankles bilaterally.     Impression and Recommendations:     This case required medical decision making of moderate complexity. The above documentation has been reviewed and is accurate and complete Lyndal Pulley, DO       Note: This dictation was prepared with Dragon dictation along with smaller phrase technology. Any transcriptional errors that result from this process are unintentional.

## 2018-04-03 ENCOUNTER — Ambulatory Visit: Payer: Medicare Other | Admitting: Family Medicine

## 2018-04-07 ENCOUNTER — Encounter: Payer: Self-pay | Admitting: Neurology

## 2018-04-07 ENCOUNTER — Ambulatory Visit (INDEPENDENT_AMBULATORY_CARE_PROVIDER_SITE_OTHER): Payer: Medicare Other | Admitting: Neurology

## 2018-04-07 VITALS — BP 106/68 | HR 68 | Ht 62.0 in | Wt 103.0 lb

## 2018-04-07 DIAGNOSIS — R253 Fasciculation: Secondary | ICD-10-CM | POA: Diagnosis not present

## 2018-04-07 DIAGNOSIS — I6523 Occlusion and stenosis of bilateral carotid arteries: Secondary | ICD-10-CM

## 2018-04-07 NOTE — Progress Notes (Signed)
[facility]  Provider:  Dr Brett Fairy Referring Provider: Plotnikov, [name], MD Primary Care Physician:  Plotnikov, [name], MD    HPI:  [name] is a 67 y.o. female here as a patient of  Dr. [name] for follow up on [name] headaches.  I see this patient today , 04-07-2018, in a yearly follow up-  [name] husband just retired this Summer, is home.  She had a visual field restriction on one day,  Occurrence on August 7th 2019 - she had seen ophthalmology on call ,followed by [name] regular eye specialist , [name]. Than was send for carotid doppler , which revealed wide open arteries ( Dr. Angelena Form). On 02-28-2018 she suddenly felt a severe neck pain. She had reached for something on the floor of [name] car- The pain went away, but the tenderness remained over the right occipital area. She wonders about occipital neuralgia. She still has benign fasciculations. She still takes omeprazole.   I had the pleasure of seeing [name] today on 04/01/2017, she has noticed that she has more headaches since taking pantoprazole. She started an every other day regimen and noted that headaches aren't changing according to the day of intake. She still struggles with reflux however and is awaiting appointments with Dr. Dorann Lodge often Dr. Henrene Pastor to address his underlying condition. Reflux causes [name] to cough at night and in daytime. She has also noted some heart palpitations, transiently reported substernal chest wall fasciculations that seemed to have resolved as spontaneously as they came.  I have the pleasure of seeing [name] today on 12/18/2016. She reports that she has still freedom from headaches, [name] fasciculations have much resolved, the lifestyle changes she told me about in 2017 have really helped [name].  She does have some neck arthritis and she has noticed that sometimes pain seems to radiate from the neck towards the head. Headaches may be announced by an AURA, nausea,  resolve with motrin, can be associated with phonophobia.  We wondered if we should obtain an MRI brain and cervical spine at the same time, I would be very much in favor of this, and [name] will still think about. In addition she reports that [name] husband plans to retire next year. She has noted that she sleeps best when alone. [name] husband is a Insurance underwriter and is often not at home, she is worried that when they finally lift and sleep together in the same room that would be more interruption of [name] sleep but also [name] snoring may interrupt his sleep. For this reason we will undergo a home sleep test to see if she has apnea or is truly just snoring. Again, most of the night she will spend alone and there is no witness to [name] asleep. She continues to be physically active, with a low body mass index and no medical conditions of concern at this time.   HPI- beginning 2012 :  The patient reports that she had 2 spells of near-syncope one in May 2014 and one in June of 2014 . She also has a history of migraines with aura, benign fasciculations which have affected also facial muscles, at times presenting like a tic. The benign fasciculations have been present for over 2 decades and are not related to a minor atrophic lateral sclerosis. Pulse of the near-syncope spells but the patient reported had a diplopia component and appeared to have been experienced as a vertical. The patient reports that the first spell was likely related to dehydration  but is not quite sure about the second. The spells don't last long and if she takes fluid and fluid they quickly resolve and do not return. She was sent by [name] primary care physician for an MRI of the brain which returned normal. She has been seen by [name]  for the brief dizziness, loss of vision , diplopia. She is diagnosed of with macular degeneration early stage , myopia, and ocular migraines.  Vision was 20/25 in both eyes, ocular  pressure was 18 in the right, and 19 mm in  the left eye and [name]  reported that the patient is early cataract formation but she can see well.The optic nerves are healthy , but there is a macular drusen in the right eye he recommended ocular vitamins and to continue contact lens use the patient was otherwise found to be in good health ,and he did not impose any restrictions on driving activities of daily living etc. The detailed report is reviewed here today as is [name] MRI report which was entirely normal. She has meanwhile reduced or eliminated [name] caffeine intake and noticed a reduction in twitches. [name] results of the EMG and NCV study have helped [name] to relax and fee safe with [name] condition. She is snoring now, which doesn't bother [name] but [name] spouse.  She has noted a feeling of thumping at the right ear and [name] ENT suggested that this may be a manifestation of [name] fasciculations. [name] parents lives in [place], [place]. Close to [name], [name] is in a rehab facility. [name] husband is an Emergency planning/management officer, now with [employer].   09-13-2015 Benign fasciculations resolved !  Patient presents now with an ear pain, left side only. Nothing visible on the left ear.  [name] orthodontist noted some TMJ  click.  At the same time she had half sided headaches. These were not neuralgic and have resolved.  The patient does not recall having any zoster outbreaks, she did have some aphtose stomatitis non-herpetic. Dentist found no herpetic lesions and Crista Luria tested for zoster- negative. Since she does have a slight click at the left temporomandibular joint I recommend that she will purchase a baby toothbrush, one of those that are worn on top of the index finger like a thimble, and massages from the inside the area of the TMJ joint, she can use it on the outside as well.  This helps TMJ pain and click to disappear quicker. At this time I'm just happy that [name] symptoms have resolved and I'm happy to see [name] for any recurrent issues or new problems in the  future.  02-15-2016, Mrs. Minckler has experienced an isolated event of diplopia that she related to the left eye. She also has a recurrence of migrainous headaches but they're all a has changed. She used to experience fortification lines which is  now associated with a very bright light perception. The visual aura the last 20-30 minutes. A lot of times there is no headache to follow. She experienced the diplopia before she saw [name] ophthalmologist [name], who then ordered a carotid Doppler study which returned with clinically insignificant results. Today, [name] headache is more like a pressure. She continued allergy shots with [name]. A few years ago she has experienced vertical diplopia, and MRI in 2014  was negative- ordered by Dr [name] at the time  . She may take 3 Motrin in a week. I explained that migraines can change to this state with reaching the  5th decade of life. Many women that experienced severe nausea and headaches associated with menstrual cycles, has later in life the aura  without headaches to follow . Is benign fasciculations there is truly not changed there is no treatment available nor is it necessary. Is important for the patient to stay hydrated and could not get hypoglycemic as these will lead to presyncopal spells as well as an aunt who took and frequency rise in fasciculations, an  asked [name] to restrict caffeine intake.  03-27-2016,  patient reports resolution of headaches after quitting chocolate covered almonds ! No more diplopia , rare fasciculation. All tests were normal.     Review of Systems: Out of a complete 14 system review, the patient complains of only the following symptoms, and all other reviewed systems are negative. Less twitching . Snoring reported again, she is complaining of nasal drip , eye itching . HST negative .She is treated by Warren Lacy, MD at [facility]. She inquired about a sleep evaluation. I would like for [name] to use [name] nasal spray  at night to see if she would stop mouth breathing which is the main reason for snoring. She reports diplopia, and related this to Dr [name] who in return ordered to evaluate [name] carotid artery- again negative for significant stenosis - 01-2018.   Social History   Socioeconomic History  . Marital status: Married    Spouse name: [name]  . Number of children: 0  . Years of education: 41  . Highest education level: Not on file  Occupational History  . Occupation: OFFICE liasion    Employer: [employer]  . Occupation: Engineer, structural  . Occupation: OFFICE LE[employer]ON    Employer: [employer]  Social Needs  . Financial resource strain: Not on file  . Food insecurity:    Worry: Not on file    Inability: Not on file  . Transportation needs:    Medical: Not on file    Non-medical: Not on file  Tobacco Use  . Smoking status: Never Smoker  . Smokeless tobacco: Never Used  Substance and Sexual Activity  . Alcohol use: No  . Drug use: No  . Sexual activity: Not on file  Lifestyle  . Physical activity:    Days per week: Not on file    Minutes per session: Not on file  . Stress: Not on file  Relationships  . Social connections:    Talks on phone: Not on file    Gets together: Not on file    Attends religious service: Not on file    Active member of club or organization: Not on file    Attends meetings of clubs or organizations: Not on file    Relationship status: Not on file  . Intimate partner violence:    Fear of current or ex partner: Not on file    Emotionally abused: Not on file    Physically abused: Not on file    Forced sexual activity: Not on file  Other Topics Concern  . Not on file  Social History Narrative   [employer]  in [place]. Married '90 - marriage is in very good shape '12, No children.  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 Offerred medical services for them if  needed (Nov '11)   Patient is married [name]) and lives at home with [name] husband.   Patient is working part-time  administrative work.   Patient has a high school education.   Patient is right-handed.   Patient does not drink any caffeine.          Family History  Problem Relation Age of Onset  . Hyperlipidemia [name]   . COPD [name]        Smoker, deceased 07-08-2014  . Hypertension [name]   . Hyperlipidemia Mother   . Hypertension Mother   . Diabetes Neg Hx   . Colon cancer Neg Hx   . Breast cancer Neg Hx   . Coronary artery disease Neg Hx   . Cancer Neg Hx        breast or colon    Past Medical History:  Diagnosis Date  . Allergic rhinitis, cause unspecified   . Backache, unspecified   . Benign fasciculation-cramp syndrome 12/03/2012    Worsening with  stress, fatigue .  [name] Calculus of kidney   . Conversion disorder   . Cotton wool spots   . Esophageal reflux   . Floater, vitreous, left 08-Jul-2016  . GERD (gastroesophageal reflux disease)   . Hemorrhage of rectum and anus   . Internal hemorrhoids without mention of complication   . Irritable bowel syndrome   . Microscopic hematuria   . Migraine headache with aura   . Mild aortic insufficiency   . Mild tricuspid regurgitation   . Osteopenia   . Other plastic surgery for unacceptable cosmetic appearance    Inj tx fllers/expander  . Personal history of urinary calculi   . Postmenopausal atrophic vaginitis   . Premature atrial contractions   . PVC (premature ventricular contraction)    a. Event monitor 2013.  [name] Unspecified constipation   . Vasovagal syncope     Past Surgical History:  Procedure Laterality Date  . BREAST BIOPSY Right   . Cosmetic Procedures w/Injection therapy    . SKIN CANCER EXCISION Left    thigh  . WISDOM TOOTH EXTRACTION      Current Outpatient Medications  Medication Sig Dispense Refill  . ALREX 0.2 % SUSP Apply 0.2 drops to eye as needed (allergies).     . Botulinum Toxin Type A, Cosm,  (BOTOX COSMETIC) 50 UNITS SOLR Inject 4-6 Units into the muscle. 3 month  injection    . conjugated estrogens (PREMARIN) vaginal cream Use PV once every 2-3 days 90 g 2  . Cyanocobalamin (VITAMIN B-12) 1000 MCG SUBL Place 1 tablet (1,000 mcg total) under the tongue every other day. 100 tablet 3  . EPIPEN 2-PAK 0.3 MG/0.3ML SOAJ injection See admin instructions. Reported on 10/08/2015  1  . fluocinonide cream (LIDEX) 4.40 % Apply 1 application topically 2 (two) times daily. 90 g 2  . omeprazole (PRILOSEC) 40 MG capsule TAKE 1 CAPSULE BY MOUTH EVERY DAY 90 capsule 3  . tretinoin (RETIN-A) 0.05 % cream Apply 1 application topically at bedtime.    . triamcinolone cream (KENALOG) 0.1 % APPLY TO AFFECTED AREA 3 TIMES A DAY 30 g 0   No current facility-administered medications for this visit.     Allergies as of 04/07/2018 - Review Complete 04/07/2018  Allergen Reaction Noted  . Codeine Other (See Comments)   . Doxycycline Other (See Comments) 05/31/2016  . Epinephrine  08/12/2012  . Neomycin  03/08/2015  . Penicillins Hives and Other (See Comments)   . Protonix [pantoprazole sodium]  06/06/2017  . Sulfonamide derivatives Other (See Comments)   . Zithromax [azithromycin] Rash 08/18/2014    Vitals: BP 106/68  Pulse 68   Ht 5\' 2"  (1.575 m)   Wt 103 lb (46.7 kg)   BMI 18.84 kg/m  Last Weight:  Wt Readings from Last 1 Encounters:  04/07/18 103 lb (46.7 kg)   Last Height:   Ht Readings from Last 1 Encounters:  04/07/18 5\' 2"  (1.575 m)   Vision Screening:  Left eye with correction 25/20.  Right eye with correction 25/20. Monovision, contacts.   Physical exam:  General: The patient is awake, alert and appears not in acute distress. The patient is well groomed. Head: Normocephalic, atraumatic. Neck is supple. Mallampati 1- wide open-  neck circumference 12.5 inches, No retrognathia. TMJ click on the right.  Cardiovascular:  Regular rate and rhythm, without  murmurs or carotid bruit, and  without distended neck veins.  Respiratory: Lungs are clear to auscultation. Skin:  Without evidence of edema, or rash Trunk: BMI low normal.  Neurologic exam : The patient is awake and alert, oriented to place and time.   Memory subjective described as intact. There is a normal attention span & concentration ability.  Speech is fluent without  dysarthria, dysphonia or aphasia. Mood and affect are appropriate.  Cranial nerves: Pupils are equal and briskly reactive to light. Extraocular movements  in vertical and horizontal planes intact and without nystagmus.  Hearing to finger rub intact.  Facial sensation intact to fine touch. Facial motor strength is symmetric and tongue and uvula move midline. Motor exam: Normal tone and normal muscle bulk and symmetric normal strength in all extremities. No fasciculations.  Sensory:  Fine touch, pinprick and vibration were normal  in all extremities.  Coordination: Rapid alternating movements in the fingers/hands is normal.  Finger-to-nose maneuver tested and normal without evidence of ataxia, dysmetria or tremor. Gait and station: Patient walks without assistive device.  Deep tendon reflexes: in the upper and lower extremities are symmetric and intact.   Assessment:  After physical and neurologic examination, review of laboratory studies, imaging, neurophysiology testing and pre-existing records.  Benign fasciculations resolved ! She stayed off caffeine. She has rarely palpitations, since reflux is controlled. Dr. Henrene Pastor is [name] GI specialist. She is not drinking alcohol, not eating late.  [name] orthodontist noted some TMJ click, will address snoring in a device- she has no need for CPAP. [name] HST was negative for sleep apnea.    Headaches resolved since August 2018 after she stopped eating chocolate covered almonds.  Diplopia horizontal, related to stress situations isolated events- , no longer present since early 2018. Amaurosis fugax episode versus  complicated migraine- negative dopplers. .    Yearly follow up next in 2020    Larey Seat, MD   CC Dr. Loni Muse. Plotnikov

## 2018-04-10 DIAGNOSIS — J301 Allergic rhinitis due to pollen: Secondary | ICD-10-CM | POA: Diagnosis not present

## 2018-04-10 DIAGNOSIS — J3089 Other allergic rhinitis: Secondary | ICD-10-CM | POA: Diagnosis not present

## 2018-04-14 ENCOUNTER — Ambulatory Visit (INDEPENDENT_AMBULATORY_CARE_PROVIDER_SITE_OTHER): Payer: Medicare Other | Admitting: Internal Medicine

## 2018-04-14 ENCOUNTER — Encounter: Payer: Self-pay | Admitting: Internal Medicine

## 2018-04-14 ENCOUNTER — Ambulatory Visit: Payer: Medicare Other

## 2018-04-14 VITALS — BP 116/72 | HR 78 | Temp 98.2°F | Ht 62.0 in | Wt 102.0 lb

## 2018-04-14 DIAGNOSIS — Z23 Encounter for immunization: Secondary | ICD-10-CM | POA: Diagnosis not present

## 2018-04-14 DIAGNOSIS — T148XXA Other injury of unspecified body region, initial encounter: Secondary | ICD-10-CM | POA: Diagnosis not present

## 2018-04-14 DIAGNOSIS — I6523 Occlusion and stenosis of bilateral carotid arteries: Secondary | ICD-10-CM | POA: Diagnosis not present

## 2018-04-14 NOTE — Progress Notes (Signed)
Subjective:  Patient ID: [name], female    DOB: May 25, 1951  Age: 67 y.o. MRN: [id number]  CC: Wound Check   HPI [name] presents for a concern about an area over her right buttocks.  She exercised in an ill fitting thong about 3 days ago, later that day she took a shower and noticed a painful wound over her right buttocks.  She did not see any blisters, exudate, or bleeding.  Outpatient Medications Prior to Visit  Medication Sig Dispense Refill  . ALREX 0.2 % SUSP Apply 0.2 drops to eye as needed (allergies).     . Botulinum Toxin Type A, Cosm, (BOTOX COSMETIC) 50 UNITS SOLR Inject 4-6 Units into the muscle. 3 month  injection    . conjugated estrogens (PREMARIN) vaginal cream Use PV once every 2-3 days 90 g 2  . Cyanocobalamin (VITAMIN B-12) 1000 MCG SUBL Place 1 tablet (1,000 mcg total) under the tongue every other day. 100 tablet 3  . EPIPEN 2-PAK 0.3 MG/0.3ML SOAJ injection See admin instructions. Reported on 10/08/2015  1  . fluocinonide cream (LIDEX) 8.76 % Apply 1 application topically 2 (two) times daily. 90 g 2  . omeprazole (PRILOSEC) 40 MG capsule TAKE 1 CAPSULE BY MOUTH EVERY DAY 90 capsule 3  . tretinoin (RETIN-A) 0.05 % cream Apply 1 application topically at bedtime.    . triamcinolone cream (KENALOG) 0.1 % APPLY TO AFFECTED AREA 3 TIMES A DAY 30 g 0   No facility-administered medications prior to visit.     ROS Review of Systems  Skin: Positive for wound. Negative for color change.  All other systems reviewed and are negative.   Objective:  BP 116/72   Pulse 78   Temp 98.2 F (36.8 C) (Oral)   Ht 5\' 2"  (1.575 m)   Wt 102 lb (46.3 kg)   SpO2 97%   BMI 18.66 kg/m   BP Readings from Last 3 Encounters:  04/14/18 116/72  04/07/18 106/68  03/13/18 100/70    Wt Readings from Last 3 Encounters:  04/14/18 102 lb (46.3 kg)  04/07/18 103 lb (46.7 kg)  03/13/18 103 lb (46.7 kg)    Physical Exam  Musculoskeletal:       Back:    Lab  Results  Component Value Date   WBC 7.4 03/03/2018   HGB 13.1 03/03/2018   HCT 38.2 03/03/2018   PLT 215.0 03/03/2018   GLUCOSE 105 (H) 03/03/2018   CHOL 204 (H) 03/03/2018   TRIG 197.0 (H) 03/03/2018   HDL 66.70 03/03/2018   LDLCALC 98 03/03/2018   ALT 13 03/03/2018   AST 16 03/03/2018   NA 139 03/03/2018   K 3.8 03/03/2018   CL 103 03/03/2018   CREATININE 0.83 03/03/2018   BUN 19 03/03/2018   CO2 31 03/03/2018   TSH 1.70 03/03/2018    Dg Cervical Spine With Flex & Extend  Result Date: 03/14/2018 CLINICAL DATA:  Chronic rt side neck pain x yrs, NKI. Per pt refused to do complete spine views and but only wants to do Flexion AND Extension views. EXAM: CERVICAL SPINE COMPLETE WITH FLEXION AND EXTENSION VIEWS COMPARISON:  06/16/2017 FINDINGS: LATERAL views are performed of the cervical spine during flexion and extension. Patient declined remaining views of the cervical spine. There is disc height loss at C4-5 and C5-6, similar in appearance to previous study. No acute fracture or subluxation. With flexion and extension there is limited [name] of motion but no translocation. IMPRESSION: 1. Mid cervical  degenerative changes. 2. No spondylolisthesis. Electronically Signed   By: Nolon Nations M.D.   On: 03/14/2018 08:10    Assessment & Plan:   Amica was seen today for wound check.  Diagnoses and all orders for this visit:  Friction injury to skin- She will apply bacitracin to prevent the area from becoming infected.  She will keep the area clean and protected.  She will let me know if she develops any new or worsening symptoms.  Other orders -     Flu vaccine HIGH DOSE PF (Fluzone High dose)   I am having [name] maintain her Botulinum Toxin Type A (Cosm), ALREX, tretinoin, fluocinonide cream, EPIPEN 2-PAK, triamcinolone cream, conjugated estrogens, Vitamin B-12, and omeprazole.  No orders of the defined types were placed in this encounter.    Follow-up: Return  if symptoms worsen or fail to improve.  Scarlette Calico, MD

## 2018-04-14 NOTE — Patient Instructions (Signed)

## 2018-04-17 DIAGNOSIS — Z23 Encounter for immunization: Secondary | ICD-10-CM | POA: Diagnosis not present

## 2018-04-17 DIAGNOSIS — S31819A Unspecified open wound of right buttock, initial encounter: Secondary | ICD-10-CM | POA: Diagnosis not present

## 2018-04-17 DIAGNOSIS — L0100 Impetigo, unspecified: Secondary | ICD-10-CM | POA: Diagnosis not present

## 2018-04-24 DIAGNOSIS — J301 Allergic rhinitis due to pollen: Secondary | ICD-10-CM | POA: Diagnosis not present

## 2018-04-24 DIAGNOSIS — J3089 Other allergic rhinitis: Secondary | ICD-10-CM | POA: Diagnosis not present

## 2018-04-28 DIAGNOSIS — J301 Allergic rhinitis due to pollen: Secondary | ICD-10-CM | POA: Diagnosis not present

## 2018-04-28 DIAGNOSIS — J3089 Other allergic rhinitis: Secondary | ICD-10-CM | POA: Diagnosis not present

## 2018-05-05 DIAGNOSIS — J301 Allergic rhinitis due to pollen: Secondary | ICD-10-CM | POA: Diagnosis not present

## 2018-05-05 DIAGNOSIS — J3089 Other allergic rhinitis: Secondary | ICD-10-CM | POA: Diagnosis not present

## 2018-05-05 DIAGNOSIS — J069 Acute upper respiratory infection, unspecified: Secondary | ICD-10-CM | POA: Diagnosis not present

## 2018-05-05 DIAGNOSIS — R21 Rash and other nonspecific skin eruption: Secondary | ICD-10-CM | POA: Diagnosis not present

## 2018-05-12 ENCOUNTER — Encounter: Payer: Self-pay | Admitting: Family Medicine

## 2018-05-12 ENCOUNTER — Ambulatory Visit (INDEPENDENT_AMBULATORY_CARE_PROVIDER_SITE_OTHER): Payer: Medicare Other | Admitting: Family Medicine

## 2018-05-12 ENCOUNTER — Ambulatory Visit: Payer: Self-pay | Admitting: *Deleted

## 2018-05-12 VITALS — BP 118/70 | HR 70 | Temp 98.6°F | Resp 16 | Ht 62.0 in | Wt 102.2 lb

## 2018-05-12 DIAGNOSIS — J301 Allergic rhinitis due to pollen: Secondary | ICD-10-CM | POA: Diagnosis not present

## 2018-05-12 DIAGNOSIS — J3089 Other allergic rhinitis: Secondary | ICD-10-CM | POA: Diagnosis not present

## 2018-05-12 DIAGNOSIS — R070 Pain in throat: Secondary | ICD-10-CM | POA: Diagnosis not present

## 2018-05-12 DIAGNOSIS — F411 Generalized anxiety disorder: Secondary | ICD-10-CM

## 2018-05-12 DIAGNOSIS — I6523 Occlusion and stenosis of bilateral carotid arteries: Secondary | ICD-10-CM | POA: Diagnosis not present

## 2018-05-12 DIAGNOSIS — K219 Gastro-esophageal reflux disease without esophagitis: Secondary | ICD-10-CM

## 2018-05-12 NOTE — Telephone Encounter (Signed)
Please advise 

## 2018-05-12 NOTE — Telephone Encounter (Signed)
Patient has a diagnosis of acid reflux she reports. Lately, she has had increased reflux and no reported dietary changes. She currently takes Prilosec 40 MG daily. Stated she was prescribed Zantac in the past to use along with Prilosec when she needed it. She has recently tried Pepto and Tums which neither helped very much. She is asking for Dr. Judeen Hammans advice on something in addition to Prilosec that she can take-in the form of prescription. [facility] on file. Routing to PCP for consideration.  Reason for Disposition . Pharmacy calling with prescription questions and triager unable to answer question  Answer Assessment - Initial Assessment Questions 1. SYMPTOMS: "Do you have any symptoms?"     Acid reflux with hoarse voice at times. 2. SEVERITY: If symptoms are present, ask "Are they mild, moderate or severe?"     Mild.  Protocols used: MEDICATION QUESTION CALL-A-AH

## 2018-05-12 NOTE — Progress Notes (Signed)
ACUTE VISIT  HPI:  Chief Complaint  Patient presents with  . Raspy voice  . Throat discomfort    started last week    [name] is a 67 y.o.female here today complaining of 5-7 days of respiratory symptoms. Throat pressure/tightness sensation, intermittently. She seems to be feeling better today. No associated fever, chills, or myalgias.  It is alleviated by swallowing. She has not identified exacerbating factors. She wonders if this is associated with anxiety. Mild dysphonia. She denies dysphagia, stridor, or odynophagia.  Sore Throat   This is a new problem. The current episode started in the past 7 days. The problem has been gradually improving. There has been no fever. The pain is at a severity of 0/10. The patient is experiencing no pain. Associated symptoms include a hoarse voice. Pertinent negatives include no abdominal pain, congestion, coughing, diarrhea, drooling, ear discharge, ear pain, headaches, plugged ear sensation, neck pain, shortness of breath, stridor, swollen glands, trouble swallowing or vomiting. She has had no exposure to strep or mono. Treatments tried: Flonase nasal spray. The treatment provided moderate relief.   No Hx of recent travel. Some of her coworkers were sick last week. No known insect bite.  Hx of allergies: Allergic rhinitis, she started Flonase nasal spray yesterday.  Postnasal drainage seems to improve.   OTC medications for this problem: Yesterday she started Flonase nasal spray.  She has a prescription for alprazolam 0.5 mg, which was prescribed in the past for anxiety. She wonders if she can take this medication as needed. Her family is coming to town, which is a stressor for her.  She denies depressed mood. Negative for suicidal thoughts.  She also has history of GERD, symptoms seem well controlled with omeprazole 20 mg daily. She denies abdominal pain, nausea, vomiting, or changes in bowel  habits.   Review of Systems  Constitutional: Negative for activity change, appetite change, fatigue and fever.  HENT: Positive for hoarse voice, postnasal drip and voice change. Negative for congestion, drooling, ear discharge, ear pain, mouth sores, sinus pressure, sore throat and trouble swallowing.   Eyes: Negative for discharge, redness and itching.  Respiratory: Negative for cough, shortness of breath, wheezing and stridor.   Gastrointestinal: Negative for abdominal pain, diarrhea, nausea and vomiting.  Musculoskeletal: Negative for gait problem, myalgias and neck pain.  Skin: Negative for rash.  Allergic/Immunologic: Positive for environmental allergies.  Neurological: Negative for weakness, numbness and headaches.  Hematological: Negative for adenopathy. Does not bruise/bleed easily.  Psychiatric/Behavioral: Negative for confusion. The patient is nervous/anxious.       Current Outpatient Medications on File Prior to Visit  Medication Sig Dispense Refill  . ALREX 0.2 % SUSP Apply 0.2 drops to eye as needed (allergies).     . Botulinum Toxin Type A, Cosm, (BOTOX COSMETIC) 50 UNITS SOLR Inject 4-6 Units into the muscle. 3 month  injection    . conjugated estrogens (PREMARIN) vaginal cream Use PV once every 2-3 days 90 g 2  . Cyanocobalamin (VITAMIN B-12) 1000 MCG SUBL Place 1 tablet (1,000 mcg total) under the tongue every other day. 100 tablet 3  . EPIPEN 2-PAK 0.3 MG/0.3ML SOAJ injection See admin instructions. Reported on 10/08/2015  1  . fluocinonide cream (LIDEX) 5.85 % Apply 1 application topically 2 (two) times daily. 90 g 2  . omeprazole (PRILOSEC) 40 MG capsule TAKE 1 CAPSULE BY MOUTH EVERY DAY 90 capsule 3  . tretinoin (RETIN-A) 0.05 % cream Apply 1 application  topically at bedtime.    . triamcinolone cream (KENALOG) 0.1 % APPLY TO AFFECTED AREA 3 TIMES A DAY 30 g 0   No current facility-administered medications on file prior to visit.      Past Medical History:   Diagnosis Date  . Allergic rhinitis, cause unspecified   . Backache, unspecified   . Benign fasciculation-cramp syndrome 12/03/2012    Worsening with  stress, fatigue .  [name] Calculus of kidney   . Conversion disorder   . Cotton wool spots   . Esophageal reflux   . Floater, vitreous, left 05/2016  . GERD (gastroesophageal reflux disease)   . Hemorrhage of rectum and anus   . Internal hemorrhoids without mention of complication   . Irritable bowel syndrome   . Microscopic hematuria   . Migraine headache with aura   . Mild aortic insufficiency   . Mild tricuspid regurgitation   . Osteopenia   . Other plastic surgery for unacceptable cosmetic appearance    Inj tx fllers/expander  . Personal history of urinary calculi   . Postmenopausal atrophic vaginitis   . Premature atrial contractions   . PVC (premature ventricular contraction)    a. Event monitor 2013.  [name] Unspecified constipation   . Vasovagal syncope    Allergies  Allergen Reactions  . Codeine Other (See Comments)    Doesn't remeber  . Doxycycline Other (See Comments)  . Epinephrine     Heart racing   . Neomycin     Topical rash from cream  . Penicillins Hives and Other (See Comments)    Has patient had a PCN reaction causing immediate rash, facial/tongue/throat swelling, SOB or lightheadedness with hypotension: Yes Has patient had a PCN reaction causing severe rash involving mucus membranes or skin necrosis: Yes Has patient had a PCN reaction that required hospitalization No Has patient had a PCN reaction occurring within the last 10 years: No If all of the above answers are "NO", then may proceed with Cephalosporin use.   . Protonix [Pantoprazole Sodium]     ?HAs  . Sulfonamide Derivatives Other (See Comments)    Doesn't remember   . Zithromax [Azithromycin] Rash    Social History   Socioeconomic History  . Marital status: Married    Spouse name: [name]  . Number of children: 0  . Years of education: 72  .  Highest education level: Not on file  Occupational History  . Occupation: OFFICE liasion    Employer: [employer]  . Occupation: Engineer, structural  . Occupation: OFFICE LEAZON    Employer: [employer]  Social Needs  . Financial resource strain: Not on file  . Food insecurity:    Worry: Not on file    Inability: Not on file  . Transportation needs:    Medical: Not on file    Non-medical: Not on file  Tobacco Use  . Smoking status: Never Smoker  . Smokeless tobacco: Never Used  Substance and Sexual Activity  . Alcohol use: No  . Drug use: No  . Sexual activity: Not on file  Lifestyle  . Physical activity:    Days per week: Not on file    Minutes per session: Not on file  . Stress: Not on file  Relationships  . Social connections:    Talks on phone: Not on file    Gets together: Not on file    Attends religious service: Not on file    Active member of club or organization:  Not on file    Attends meetings of clubs or organizations: Not on file    Relationship status: Not on file  Other Topics Concern  . Not on file  Social History Narrative   [employer]  in [place]. Married '90 - marriage is in very good shape '12, No children.  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 Offerred medical services for them if needed (Nov '11)   Patient is married [name]) and lives at home with her husband.   Patient is working Administrator, arts work.   Patient has a high school education.   Patient is right-handed.   Patient does not drink any caffeine.          Vitals:   05/12/18 0939  BP: 118/70  Pulse: 70  Resp: 16  Temp: 98.6 F (37 C)  SpO2: 99%   Body mass index is 18.7 kg/m.    Physical Exam  Nursing note and vitals reviewed. Constitutional: She is oriented to person, place, and time. She appears well-developed and well-nourished. She does not appear ill. No  distress.  HENT:  Head: Normocephalic and atraumatic.  Right Ear: Tympanic membrane, external ear and ear canal normal.  Left Ear: Tympanic membrane, external ear and ear canal normal.  Mouth/Throat: Oropharynx is clear and moist and mucous membranes are normal.  Eyes: Conjunctivae are normal.  Neck: No muscular tenderness present. No edema and no erythema present.  Cardiovascular: Normal rate and regular rhythm.  No murmur heard. Respiratory: Effort normal and breath sounds normal. No stridor. No respiratory distress.  Lymphadenopathy:       Head (right side): No submandibular adenopathy present.       Head (left side): No submandibular adenopathy present.    She has no cervical adenopathy.  Neurological: She is alert and oriented to person, place, and time. She has normal strength.  Skin: Skin is warm. No rash noted. No erythema.  Psychiatric: Her speech is normal. Her mood appears anxious.  Well groomed, good eye contact.    ASSESSMENT AND PLAN:   Ms. Autumne was seen today for raspy voice and throat discomfort.  Diagnoses and all orders for this visit:  Throat discomfort It seems to be improving. On examination today I do not appreciate any oral mucosa edema or pharyngeal erythema. Lung auscultation is negative. Some of her chronic medical problems could be contributing to this problem, including anxiety and GERD. She was clearly instructed about warning signs  Generalized anxiety disorder Some side effects of alprazolam discussed, she can take 1/2 to 1 tablet daily as needed for anxiety. Follow-up with PCP as needed.  Gastroesophageal reflux disease without esophagitis This problem could contribute to main complaint. Reported as well controlled with omeprazole. GERD precautions to continue. Follow-up with PCP as needed.      Joangel Vanosdol G. Martinique, MD  [facility]. [facility].

## 2018-05-13 MED ORDER — FAMOTIDINE 40 MG PO TABS: 40.0000 mg | ORAL_TABLET | Freq: Every day | ORAL | 3 refills | Status: DC

## 2018-05-13 NOTE — Telephone Encounter (Signed)
Will call in Pepcid D/c Zantac Thx

## 2018-05-13 NOTE — Addendum Note (Signed)
Addended by: Cassandria Anger on: 05/13/2018 07:31 AM   Modules accepted: Orders

## 2018-05-13 NOTE — Telephone Encounter (Signed)
Pt.notified

## 2018-05-19 DIAGNOSIS — J301 Allergic rhinitis due to pollen: Secondary | ICD-10-CM | POA: Diagnosis not present

## 2018-05-19 DIAGNOSIS — J3089 Other allergic rhinitis: Secondary | ICD-10-CM | POA: Diagnosis not present

## 2018-05-21 ENCOUNTER — Ambulatory Visit (INDEPENDENT_AMBULATORY_CARE_PROVIDER_SITE_OTHER): Payer: Medicare Other

## 2018-05-21 DIAGNOSIS — Z23 Encounter for immunization: Secondary | ICD-10-CM

## 2018-05-21 DIAGNOSIS — Z299 Encounter for prophylactic measures, unspecified: Secondary | ICD-10-CM

## 2018-05-22 ENCOUNTER — Ambulatory Visit (INDEPENDENT_AMBULATORY_CARE_PROVIDER_SITE_OTHER): Payer: Medicare Other | Admitting: Physician Assistant

## 2018-05-22 ENCOUNTER — Encounter: Payer: Self-pay | Admitting: Physician Assistant

## 2018-05-22 VITALS — BP 108/60 | HR 84 | Ht 61.5 in | Wt 100.0 lb

## 2018-05-22 DIAGNOSIS — R0989 Other specified symptoms and signs involving the circulatory and respiratory systems: Secondary | ICD-10-CM

## 2018-05-22 DIAGNOSIS — K219 Gastro-esophageal reflux disease without esophagitis: Secondary | ICD-10-CM

## 2018-05-22 DIAGNOSIS — R198 Other specified symptoms and signs involving the digestive system and abdomen: Secondary | ICD-10-CM

## 2018-05-22 DIAGNOSIS — I6523 Occlusion and stenosis of bilateral carotid arteries: Secondary | ICD-10-CM

## 2018-05-22 MED ORDER — DEXLANSOPRAZOLE 60 MG PO CPDR: 60.0000 mg | DELAYED_RELEASE_CAPSULE | Freq: Every day | ORAL | 11 refills | Status: DC

## 2018-05-22 MED ORDER — HYOSCYAMINE SULFATE 0.125 MG SL SUBL: 0.1250 mg | SUBLINGUAL_TABLET | SUBLINGUAL | 3 refills | Status: DC | PRN

## 2018-05-22 NOTE — Patient Instructions (Signed)
If you are age 65 or older, your body mass index should be between 23-30. Your Body mass index is 18.59 kg/m. If this is out of the aforementioned range listed, please consider follow up with your Primary Care Provider.  If you are age 41 or younger, your body mass index should be between 19-25. Your Body mass index is 18.59 kg/m. If this is out of the aformentioned range listed, please consider follow up with your Primary Care Provider.   We have sent the following medications to your pharmacy for you to pick up at your convenience: [place] have been scheduled for a Barium Esophogram at [facility] on 05/27/18 at 8 am. Please arrive 15 minutes prior to your appointment for registration. Make certain not to have anything to eat or drink 3 hours prior to your test. If you need to reschedule for any reason, please contact radiology at [phone] to do so. __________________________________________________________________ A barium swallow is an examination that concentrates on views of the esophagus. This tends to be a double contrast exam (barium and two liquids which, when combined, create a gas to distend the wall of the oesophagus) or single contrast (non-ionic iodine based). The study is usually tailored to your symptoms so a good history is essential. Attention is paid during the study to the form, structure and configuration of the esophagus, looking for functional disorders (such as aspiration, dysphagia, achalasia, motility and reflux) EXAMINATION You may be asked to change into a gown, depending on the type of swallow being performed. A radiologist and radiographer will perform the procedure. The radiologist will advise you of the type of contrast selected for your procedure and direct you during the exam. You will be asked to stand, sit or lie in several different positions and to hold a small amount of fluid in your mouth before being asked to swallow while the imaging  is performed .In some instances you may be asked to swallow barium coated marshmallows to assess the motility of a solid food bolus. The exam can be recorded as a digital or video fluoroscopy procedure. POST PROCEDURE It will take 1-2 days for the barium to pass through your system. To facilitate this, it is important, unless otherwise directed, to increase your fluids for the next 24-48hrs and to resume your normal diet.  This test typically takes about 30 minutes to perform. ____________________________________________________________________________ Dennis Bast have been given anti-reflux diet.  Elevate head of bed.  Thank you for choosing me and [facility].  Amy Esterwood, PA-C

## 2018-05-23 ENCOUNTER — Other Ambulatory Visit: Payer: Self-pay | Admitting: Physician Assistant

## 2018-05-23 ENCOUNTER — Other Ambulatory Visit: Payer: Self-pay

## 2018-05-23 ENCOUNTER — Encounter: Payer: Self-pay | Admitting: Physician Assistant

## 2018-05-23 ENCOUNTER — Telehealth: Payer: Self-pay | Admitting: Physician Assistant

## 2018-05-23 DIAGNOSIS — R0989 Other specified symptoms and signs involving the circulatory and respiratory systems: Secondary | ICD-10-CM

## 2018-05-23 DIAGNOSIS — K219 Gastro-esophageal reflux disease without esophagitis: Secondary | ICD-10-CM

## 2018-05-23 DIAGNOSIS — R198 Other specified symptoms and signs involving the digestive system and abdomen: Secondary | ICD-10-CM

## 2018-05-23 DIAGNOSIS — R09A2 Foreign body sensation, throat: Secondary | ICD-10-CM

## 2018-05-23 NOTE — Progress Notes (Signed)
Reviewed

## 2018-05-23 NOTE — Telephone Encounter (Signed)
Moved per her request. [name] 06/02/18 at 11:00. Arrive 10:45. Fast for 3 hours prior. Patient aware.

## 2018-05-23 NOTE — Telephone Encounter (Signed)
Patient was scheduled for a BE at [facility] but wants to get this done at [facility], patient already cx appt for [facility]

## 2018-05-23 NOTE — Progress Notes (Signed)
Subjective:    Patient ID: [name], female    DOB: [birth year], 67 y.o.   MRN: [id number]  HPI [name] is a pleasant 67 year old white female, known to Dr. Yancey Flemings with history of IBS, constipation, GERD and what is felt to be globus type symptoms.  She was last seen in the office in April 2019. She comes in today with concerns about sent increase in her symptoms.  She has been maintained on Protonix 40 mg p.o. every morning.  She relates that he has been on medications for reflux for many years and had evaluation with Dr. Alferd Apa in 2014 and was told that her cough and throat spasm type symptoms were secondary to reflux.  She has been on the Protonix since then and he also had her on ranitidine 300 mg at bedtime for a period of time.  She says she is done okay without that over the past couple of years.  Eventually Protonix caused headache and she was switched to omeprazole 40 p.o. every morning. She says she had an increase in overall stress level prior to Thanksgiving and had ongoing stress for a couple of weeks in a row.  She said her diet which she is usually very strict about was altered as well and she started feeling symptoms of the fullness and tightness in her throat.  She has not been having recent problems with cough but feels that she has a "raspy" voice intermittently. She wants to discuss potential changes in management to help her symptoms. She had colonoscopy in December 2014 which was normal exam other than internal hemorrhoids and EGD was last done in January 2000 which was a normal exam..  She denies any problems with abdominal pain nausea or vomiting, no regular heartburn or indigestion no dysphagia.  Even when she has the tightness or fullness feeling she is able to swallow.  She says this week she has had less symptoms as her stress level has decreased.   Review of Systems Pertinent positive and negative review of systems were noted in the above HPI  section.  All other review of systems was otherwise negative.  Outpatient Encounter Medications as of 05/22/2018  Medication Sig  . ALREX 0.2 % SUSP Apply 0.2 drops to eye as needed (allergies).   . Botulinum Toxin Type A, Cosm, (BOTOX COSMETIC) 50 UNITS SOLR Inject 4-6 Units into the muscle. 3 month  injection  . conjugated estrogens (PREMARIN) vaginal cream Use PV once every 2-3 days  . Cyanocobalamin (VITAMIN B-12) 1000 MCG SUBL Place 1 tablet (1,000 mcg total) under the tongue every other day.  [name] EPIPEN 2-PAK 0.3 MG/0.3ML SOAJ injection See admin instructions. Reported on 10/08/2015  . fluocinonide cream (LIDEX) 0.05 % Apply 1 application topically 2 (two) times daily.  [name] omeprazole (PRILOSEC) 40 MG capsule TAKE 1 CAPSULE BY MOUTH EVERY DAY  . tretinoin (RETIN-A) 0.05 % cream Apply 1 application topically at bedtime.  . triamcinolone cream (KENALOG) 0.1 % APPLY TO AFFECTED AREA 3 TIMES A DAY  . dexlansoprazole (DEXILANT) 60 MG capsule Take 1 capsule (60 mg total) by mouth daily.  . hyoscyamine (LEVSIN SL) 0.125 MG SL tablet Place 1 tablet (0.125 mg total) under the tongue every 4 (four) hours as needed (for throat tightness).  . [DISCONTINUED] famotidine (PEPCID) 40 MG tablet Take 1 tablet (40 mg total) by mouth at bedtime.   No facility-administered encounter medications on file as of 05/22/2018.    Allergies  Allergen Reactions  . Codeine  Other (See Comments)    Doesn't remeber  . Doxycycline Other (See Comments)  . Epinephrine     Heart racing   . Neomycin     Topical rash from cream  . Penicillins Hives and Other (See Comments)    Has patient had a PCN reaction causing immediate rash, facial/tongue/throat swelling, SOB or lightheadedness with hypotension: Yes Has patient had a PCN reaction causing severe rash involving mucus membranes or skin necrosis: Yes Has patient had a PCN reaction that required hospitalization No Has patient had a PCN reaction occurring within the last 10  years: No If all of the above answers are "NO", then may proceed with Cephalosporin use.   . Protonix [Pantoprazole Sodium]     ?HAs  . Sulfonamide Derivatives Other (See Comments)    Doesn't remember   . Zithromax [Azithromycin] Rash   Patient Active Problem List   Diagnosis Date Noted  . Friction injury to skin 04/14/2018  . Occipital neuralgia 03/03/2018  . B12 deficiency 03/03/2018  . Leg wound, right 12/26/2017  . LLQ abdominal pain 09/23/2017  . Elevated antinuclear antibody (ANA) level 07/26/2017  . Arthralgia 07/26/2017  . Degenerative disc disease, lumbar 07/04/2017  . Cough 06/06/2017  . Chest discomfort 03/21/2017  . Mass of neck 12/25/2016  . Snoring 12/18/2016  . Medial tibial stress syndrome, left, initial encounter 08/14/2016  . Patellofemoral syndrome of both knees 08/09/2016  . Drug allergy, antibiotic 05/31/2016  . Hyperkalemia 02/17/2016  . Vertical diplopia 02/15/2016  . Tension headache 12/28/2015  . BPPV (benign paroxysmal positional vertigo) 10/08/2015  . TMJ click 09/13/2015  . Cold sore 07/18/2015  . Osteopenia 07/18/2015  . Earache on left 07/18/2015  . Cervical disc disorder with radiculopathy of cervical region 06/17/2015  . Cotton wool spots 03/15/2015  . Left LBP 03/08/2015  . Food poisoning 03/08/2015  . Generalized anxiety disorder 03/08/2015  . Dysuria 02/23/2015  . Well adult exam 09/09/2014  . Internal hemorrhoids 09/07/2014  . Allergic urticaria 08/20/2014  . Acute sinusitis 08/18/2014  . Neck muscle spasm 07/30/2014  . Grief 06/17/2014  . Mild aortic insufficiency   . Mild tricuspid regurgitation   . Vasovagal syncope   . Premature atrial contractions   . GERD (gastroesophageal reflux disease)   . PVC (premature ventricular contraction)   . Dense breast tissue 03/23/2014  . Microhematuria 03/22/2014  . Rash and nonspecific skin eruption 12/22/2013  . Chronic meniscal tear of knee 12/21/2013  . Benign fasciculations  12/11/2013  . Strain of adductor magnus muscle of left lower extremity 05/21/2013  . Benign fasciculation-cramp syndrome 12/03/2012  . Vasovagal near syncope 11/02/2012  . Migraine with aura 11/02/2012  . Upper airway cough syndrome 08/16/2012  . Chest tightness 09/21/2011  . Bunion of great toe 09/11/2011  . Routine health maintenance 09/11/2011  . Mild mitral regurgitation by prior echocardiogram 05/15/2011  . Chronic neck pain 03/07/2011  . Skin lesion 03/07/2011  . DYSPLASTIC NEVUS, FACE 02/06/2010  . HAND PAIN, BILATERAL 08/25/2009  . Aortic valve disorder 08/02/2009  . Abnormal involuntary movements(781.0) 07/28/2009  . LUMBAR SPRAIN AND STRAIN 05/21/2009  . Palpitations 02/03/2009  . HEMORRHOIDS-INTERNAL 12/15/2008  . Constipation 12/15/2008  . Irritable bowel syndrome 12/15/2008  . RECTAL BLEEDING 12/15/2008  . RENAL CALCULUS, HX OF 11/29/2007  . Herpes simplex without mention of complication 11/03/2007  . Vaginal atrophy 03/25/2007  . GLOBUS HYSTERICUS 01/01/2007  . RHINITIS, ALLERGIC NOS 01/01/2007   Social History   Socioeconomic History  . Marital status:  Married    Spouse name: Vernia Buff  . Number of children: 0  . Years of education: 74  . Highest education level: Not on file  Occupational History  . Occupation: OFFICE liasion    Employer: [employer]  . Occupation: Engineer, civil (consulting)  . 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 Financial resource strain: Not on file  . Food insecurity:    Worry: Not on file    Inability: Not on file  . Transportation needs:    Medical: Not on file    Non-medical: Not on file  Tobacco Use  . Smoking status: Never Smoker  . Smokeless tobacco: Never Used  Substance and Sexual Activity  . Alcohol use: No  . Drug use: No  . Sexual activity: Not on file  Lifestyle  . Physical activity:    Days per week: Not on file    Minutes per session: Not on file  . Stress: Not on file   Relationships  . Social connections:    Talks on phone: Not on file    Gets together: Not on file    Attends religious service: Not on file    Active member of club or organization: Not on file    Attends meetings of clubs or organizations: Not on file    Relationship status: Not on file  . Intimate partner violence:    Fear of current or ex partner: Not on file    Emotionally abused: Not on file    Physically abused: Not on file    Forced sexual activity: Not on file  Other Topics Concern  . Not on file  Social History Narrative   [employer] in [place]. Married '90 - marriage is in very good shape '12, No children.  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   Patient is working Pensions consultant work.   Patient has a high school education.   Patient is right-handed.   Patient does not drink any caffeine.          [name] family history includes COPD in her father; Hyperlipidemia in her father and mother; Hypertension in her father and mother.      Objective:    Vitals:   05/22/18 1442  BP: 108/60  Pulse: 84    Physical Exam; well-developed white female in no acute distress, pleasant, height 5 foot 1, weight 100, BMI of 18.5.  Not further examined today       Assessment & Plan:   #91 67 year old female with history of chronic GERD, probable LPR and intermittent globus type symptoms. Patient has had an increase in globus symptoms over the past several weeks likely related to increased stress. She does not have any true dysphagia or odynophagia.  Rule out esophageal spasm, rule out component of dysmotility  #2 colon cancer surveillance-up-to-date with colonoscopy last done in 2014 and normal and indicated for 10-year interval follow-up #3.  IBS #4.   Internal hemorrhoids  Plan; Patient will tighten up on antireflux precautions, she was given an antireflux diet.  We also discussed n.p.o. for 2 to 3 hours prior to bedtime and elevation of the head of her bed about 45 degrees.  She would like to try Dexilant 60  mg.  Prescription was sent today if this is effective she will continue this and discontinue omeprazole.  I also gave her a prescription for Levsin sublingual to try on a as needed basis every 4 to 6 hours for lobus/spasm symptoms  Will schedule for barium swallow with tablet. Further plans pending response to medication changes and results of barium swallow.  Ernst Cumpston S Daenerys Buttram PA-C 05/23/2018   Cc: Plotnikov, Georgina Quint, MD

## 2018-05-27 ENCOUNTER — Ambulatory Visit (HOSPITAL_COMMUNITY): Payer: Medicare Other

## 2018-05-27 ENCOUNTER — Telehealth: Payer: Self-pay | Admitting: Gastroenterology

## 2018-05-27 DIAGNOSIS — J301 Allergic rhinitis due to pollen: Secondary | ICD-10-CM | POA: Diagnosis not present

## 2018-05-27 DIAGNOSIS — J3089 Other allergic rhinitis: Secondary | ICD-10-CM | POA: Diagnosis not present

## 2018-05-27 NOTE — Telephone Encounter (Signed)
Pt is having more complications and wanting to know if other medication can be ordered.

## 2018-05-27 NOTE — Telephone Encounter (Signed)
Left a message to call 

## 2018-05-28 ENCOUNTER — Ambulatory Visit: Payer: Medicare Other | Admitting: Internal Medicine

## 2018-05-29 ENCOUNTER — Ambulatory Visit (INDEPENDENT_AMBULATORY_CARE_PROVIDER_SITE_OTHER): Payer: Medicare Other | Admitting: Internal Medicine

## 2018-05-29 ENCOUNTER — Encounter: Payer: Self-pay | Admitting: Internal Medicine

## 2018-05-29 DIAGNOSIS — K219 Gastro-esophageal reflux disease without esophagitis: Secondary | ICD-10-CM | POA: Diagnosis not present

## 2018-05-29 DIAGNOSIS — I6523 Occlusion and stenosis of bilateral carotid arteries: Secondary | ICD-10-CM

## 2018-05-29 DIAGNOSIS — J029 Acute pharyngitis, unspecified: Secondary | ICD-10-CM

## 2018-05-29 MED ORDER — MOMETASONE FUROATE 50 MCG/ACT NA SUSP: 2.0000 | Freq: Every day | NASAL | 12 refills | Status: DC

## 2018-05-29 MED ORDER — ALPRAZOLAM 0.25 MG PO TABS: ORAL_TABLET | ORAL | 1 refills | Status: DC

## 2018-05-29 MED ORDER — LORATADINE 10 MG PO TABS: 10.0000 mg | ORAL_TABLET | Freq: Every day | ORAL | 11 refills | Status: DC

## 2018-05-29 NOTE — Progress Notes (Signed)
Subjective:  Patient ID: [name], female    DOB: 09/13/1950  Age: 67 y.o. MRN: [id number]  CC: No chief complaint on file.   HPI [name] presents for ST since Thanksgiving. Tried Pepcid - not better. Ba swallow test is scheduled. C/o voice changes  Outpatient Medications Prior to Visit  Medication Sig Dispense Refill  . ALREX 0.2 % SUSP Apply 0.2 drops to eye as needed (allergies).     . Botulinum Toxin Type A, Cosm, (BOTOX COSMETIC) 50 UNITS SOLR Inject 4-6 Units into the muscle. 3 month  injection    . conjugated estrogens (PREMARIN) vaginal cream Use PV once every 2-3 days 90 g 2  . Cyanocobalamin (VITAMIN B-12) 1000 MCG SUBL Place 1 tablet (1,000 mcg total) under the tongue every other day. 100 tablet 3  . dexlansoprazole (DEXILANT) 60 MG capsule Take 1 capsule (60 mg total) by mouth daily. 30 capsule 11  . EPIPEN 2-PAK 0.3 MG/0.3ML SOAJ injection See admin instructions. Reported on 10/08/2015  1  . fluocinonide cream (LIDEX) 1.03 % Apply 1 application topically 2 (two) times daily. 90 g 2  . hyoscyamine (LEVSIN SL) 0.125 MG SL tablet Place 1 tablet (0.125 mg total) under the tongue every 4 (four) hours as needed (for throat tightness). 30 tablet 3  . omeprazole (PRILOSEC) 40 MG capsule TAKE 1 CAPSULE BY MOUTH EVERY DAY 90 capsule 3  . tretinoin (RETIN-A) 0.05 % cream Apply 1 application topically at bedtime.    . triamcinolone cream (KENALOG) 0.1 % APPLY TO AFFECTED AREA 3 TIMES A DAY 30 g 0   No facility-administered medications prior to visit.     ROS: Review of Systems  Constitutional: Negative for activity change, appetite change, chills, fatigue and unexpected weight change.  HENT: Positive for sore throat. Negative for congestion, mouth sores and sinus pressure.   Eyes: Negative for visual disturbance.  Respiratory: Negative for cough and chest tightness.   Gastrointestinal: Negative for abdominal pain and nausea.  Genitourinary: Negative for  difficulty urinating, frequency and vaginal pain.  Musculoskeletal: Negative for back pain and gait problem.  Skin: Negative for pallor and rash.  Neurological: Negative for dizziness, tremors, weakness, numbness and headaches.  Psychiatric/Behavioral: Negative for confusion and sleep disturbance.    Objective:  BP 126/64 (BP Location: Left Arm, Patient Position: Sitting, Cuff Size: Normal)   Pulse 78   Temp 98.4 F (36.9 C) (Oral)   Ht 5' 1.5" (1.562 m)   Wt 100 lb (45.4 kg)   SpO2 97%   BMI 18.59 kg/m   BP Readings from Last 3 Encounters:  05/29/18 126/64  05/22/18 108/60  05/12/18 118/70    Wt Readings from Last 3 Encounters:  05/29/18 100 lb (45.4 kg)  05/22/18 100 lb (45.4 kg)  05/12/18 102 lb 4 oz (46.4 kg)    Physical Exam Constitutional:      General: She is not in acute distress.    Appearance: She is well-developed.  HENT:     Head: Normocephalic.     Right Ear: External ear normal.     Left Ear: External ear normal.     Nose: Nose normal.  Eyes:     General:        Right eye: No discharge.        Left eye: No discharge.     Conjunctiva/sclera: Conjunctivae normal.     Pupils: Pupils are equal, round, and reactive to light.  Neck:     Musculoskeletal: Normal [name]  of motion and neck supple.     Thyroid: No thyromegaly.     Vascular: No JVD.     Trachea: No tracheal deviation.  Cardiovascular:     Rate and Rhythm: Normal rate and regular rhythm.     Heart sounds: Normal heart sounds.  Pulmonary:     Effort: No respiratory distress.     Breath sounds: No stridor. No wheezing.  Abdominal:     General: Bowel sounds are normal. There is no distension.     Palpations: Abdomen is soft. There is no mass.     Tenderness: There is no abdominal tenderness. There is no guarding or rebound.  Musculoskeletal:        General: No tenderness.  Lymphadenopathy:     Cervical: No cervical adenopathy.  Skin:    Findings: No erythema or rash.  Neurological:      Cranial Nerves: No cranial nerve deficit.     Motor: No abnormal muscle tone.     Coordination: Coordination normal.     Deep Tendon Reflexes: Reflexes normal.  Psychiatric:        Behavior: Behavior normal.        Thought Content: Thought content normal.        Judgment: Judgment normal.     Lab Results  Component Value Date   WBC 7.4 03/03/2018   HGB 13.1 03/03/2018   HCT 38.2 03/03/2018   PLT 215.0 03/03/2018   GLUCOSE 105 (H) 03/03/2018   CHOL 204 (H) 03/03/2018   TRIG 197.0 (H) 03/03/2018   HDL 66.70 03/03/2018   LDLCALC 98 03/03/2018   ALT 13 03/03/2018   AST 16 03/03/2018   NA 139 03/03/2018   K 3.8 03/03/2018   CL 103 03/03/2018   CREATININE 0.83 03/03/2018   BUN 19 03/03/2018   CO2 31 03/03/2018   TSH 1.70 03/03/2018    Dg Cervical Spine With Flex & Extend  Result Date: 03/14/2018 CLINICAL DATA:  Chronic rt side neck pain x yrs, NKI. Per pt refused to do complete spine views and but only wants to do Flexion AND Extension views. EXAM: CERVICAL SPINE COMPLETE WITH FLEXION AND EXTENSION VIEWS COMPARISON:  06/16/2017 FINDINGS: LATERAL views are performed of the cervical spine during flexion and extension. Patient declined remaining views of the cervical spine. There is disc height loss at C4-5 and C5-6, similar in appearance to previous study. No acute fracture or subluxation. With flexion and extension there is limited [name] of motion but no translocation. IMPRESSION: 1. Mid cervical degenerative changes. 2. No spondylolisthesis. Electronically Signed   By: Nolon Nations M.D.   On: 03/14/2018 08:10    Assessment & Plan:   There are no diagnoses linked to this encounter.   No orders of the defined types were placed in this encounter.    Follow-up: No follow-ups on file.  [name], MD

## 2018-05-30 ENCOUNTER — Ambulatory Visit: Payer: Medicare Other | Admitting: Family Medicine

## 2018-05-30 DIAGNOSIS — J3089 Other allergic rhinitis: Secondary | ICD-10-CM | POA: Diagnosis not present

## 2018-05-30 DIAGNOSIS — J301 Allergic rhinitis due to pollen: Secondary | ICD-10-CM | POA: Diagnosis not present

## 2018-06-02 ENCOUNTER — Ambulatory Visit: Payer: Medicare Other | Admitting: Internal Medicine

## 2018-06-02 ENCOUNTER — Ambulatory Visit (HOSPITAL_COMMUNITY)
Admission: RE | Admit: 2018-06-02 | Discharge: 2018-06-02 | Disposition: A | Discharge status: Home / Self Care | Payer: Medicare Other | Source: Ambulatory Visit | Attending: Physician Assistant | Admitting: Physician Assistant

## 2018-06-02 DIAGNOSIS — K219 Gastro-esophageal reflux disease without esophagitis: Secondary | ICD-10-CM | POA: Diagnosis not present

## 2018-06-02 DIAGNOSIS — R0989 Other specified symptoms and signs involving the circulatory and respiratory systems: Secondary | ICD-10-CM

## 2018-06-02 DIAGNOSIS — R198 Other specified symptoms and signs involving the digestive system and abdomen: Secondary | ICD-10-CM

## 2018-06-02 DIAGNOSIS — R070 Pain in throat: Secondary | ICD-10-CM | POA: Diagnosis not present

## 2018-06-03 DIAGNOSIS — J301 Allergic rhinitis due to pollen: Secondary | ICD-10-CM | POA: Diagnosis not present

## 2018-06-03 DIAGNOSIS — J3089 Other allergic rhinitis: Secondary | ICD-10-CM | POA: Diagnosis not present

## 2018-06-04 ENCOUNTER — Telehealth: Payer: Self-pay | Admitting: Physician Assistant

## 2018-06-04 NOTE — Telephone Encounter (Signed)
Patient has not started Lesin or Dexilant. She is aware of her imaging results. Her symptoms have continued. She agrees to stop Omeprazole. She will begin Dexilant and Levsin tomorrow. Call with an update in 1 to 2 weeks.

## 2018-06-10 DIAGNOSIS — J3089 Other allergic rhinitis: Secondary | ICD-10-CM | POA: Diagnosis not present

## 2018-06-10 DIAGNOSIS — J301 Allergic rhinitis due to pollen: Secondary | ICD-10-CM | POA: Diagnosis not present

## 2018-06-12 ENCOUNTER — Telehealth: Payer: Self-pay | Admitting: Physician Assistant

## 2018-06-12 ENCOUNTER — Other Ambulatory Visit: Payer: Self-pay

## 2018-06-12 NOTE — Telephone Encounter (Signed)
Pt calling back to give an update on how she's doing with Dexilant.

## 2018-06-12 NOTE — Telephone Encounter (Signed)
Yes skipping PPI can cause rebound heartburn. Advise patient to restart Dexilant. Use Carafate as needed 1 tab as needed TID

## 2018-06-12 NOTE — Telephone Encounter (Signed)
Report she feels the Dexilant is helping. She did skip taking Dexilant yesterday because she had diarrhea and "was sick."  Today she has the burning sensation. Asks if missing a dose yesterday could cause this. No diarrhea today.   She will resume Dexilant on a daily basis. Call in a week with an update.

## 2018-06-13 ENCOUNTER — Encounter: Payer: Self-pay | Admitting: Family Medicine

## 2018-06-13 ENCOUNTER — Ambulatory Visit (INDEPENDENT_AMBULATORY_CARE_PROVIDER_SITE_OTHER): Payer: Medicare Other | Admitting: Family Medicine

## 2018-06-13 VITALS — BP 124/82 | HR 81 | Temp 98.3°F | Ht 61.5 in | Wt 101.0 lb

## 2018-06-13 DIAGNOSIS — J029 Acute pharyngitis, unspecified: Secondary | ICD-10-CM | POA: Diagnosis not present

## 2018-06-13 LAB — POCT RAPID STREP A (OFFICE): Rapid Strep A Screen: NEGATIVE

## 2018-06-13 NOTE — Progress Notes (Signed)
[name] - 67 y.o. female MRN [id number]  Date of birth: 08/10/1950  SUBJECTIVE:  Including CC & ROS.  No chief complaint on file.   [name] is a 67 y.o. female that is presenting with sore throat.  Her symptoms been present for a couple of days.  Denies any fevers.  Reports being exposed to someone that had close contact with strep throat.  Has pain with swallowing.  Has a history of aortic valve disorder.    Review of Systems  Constitutional: Negative for fever.  HENT: Positive for sore throat. Negative for congestion.   Respiratory: Negative for cough.   Cardiovascular: Negative for chest pain.  Gastrointestinal: Negative for abdominal pain.    HISTORY: Past Medical, Surgical, Social, and Family History Reviewed & Updated per EMR.   Pertinent Historical Findings include:  Past Medical History:  Diagnosis Date  . Allergic rhinitis, cause unspecified   . Backache, unspecified   . Benign fasciculation-cramp syndrome 12/03/2012    Worsening with  stress, fatigue .  [name] Calculus of kidney   . Conversion disorder   . Cotton wool spots   . Esophageal reflux   . Floater, vitreous, left 06/20/16  . GERD (gastroesophageal reflux disease)   . Hemorrhage of rectum and anus   . Internal hemorrhoids without mention of complication   . Irritable bowel syndrome   . Microscopic hematuria   . Migraine headache with aura   . Mild aortic insufficiency   . Mild tricuspid regurgitation   . Osteopenia   . Other plastic surgery for unacceptable cosmetic appearance    Inj tx fllers/expander  . Personal history of urinary calculi   . Postmenopausal atrophic vaginitis   . Premature atrial contractions   . PVC (premature ventricular contraction)    a. Event monitor 2013.  [name] Unspecified constipation   . Vasovagal syncope     Past Surgical History:  Procedure Laterality Date  . BREAST BIOPSY Right   . Cosmetic Procedures w/Injection therapy    . SKIN CANCER EXCISION Left     thigh  . WISDOM TOOTH EXTRACTION      Allergies  Allergen Reactions  . Codeine Other (See Comments)    Doesn't remeber  . Doxycycline Other (See Comments)  . Epinephrine     Heart racing   . Neomycin     Topical rash from cream  . Penicillins Hives and Other (See Comments)    Has patient had a PCN reaction causing immediate rash, facial/tongue/throat swelling, SOB or lightheadedness with hypotension: Yes Has patient had a PCN reaction causing severe rash involving mucus membranes or skin necrosis: Yes Has patient had a PCN reaction that required hospitalization No Has patient had a PCN reaction occurring within the last 10 years: No If all of the above answers are "NO", then may proceed with Cephalosporin use.   . Protonix [Pantoprazole Sodium]     ?HAs  . Sulfonamide Derivatives Other (See Comments)    Doesn't remember   . Zithromax [Azithromycin] Rash    Family History  Problem Relation Age of Onset  . Hyperlipidemia Father   . COPD Father        Smoker, deceased Jun 20, 2014  . Hypertension Father   . Hyperlipidemia Mother   . Hypertension Mother   . Diabetes Neg Hx   . Colon cancer Neg Hx   . Breast cancer Neg Hx   . Coronary artery disease Neg Hx   . Cancer Neg Hx  breast or colon  . Esophageal cancer Neg Hx   . Stomach cancer Neg Hx   . Pancreatic cancer Neg Hx   . Liver disease Neg Hx      Social History   Socioeconomic History  . Marital status: Married    Spouse name: [name]  . Number of children: 0  . Years of education: 16  . Highest education level: Not on file  Occupational History  . Occupation: OFFICE liasion    Employer: [employer]  . Occupation: Engineer, structural  . Occupation: OFFICE [employer]    Employer: [employer]  Social Needs  . Financial resource strain: Not on file  . Food insecurity:    Worry: Not on file    Inability: Not on file  . Transportation needs:    Medical: Not on file    Non-medical: Not  on file  Tobacco Use  . Smoking status: Never Smoker  . Smokeless tobacco: Never Used  Substance and Sexual Activity  . Alcohol use: No  . Drug use: No  . Sexual activity: Not on file  Lifestyle  . Physical activity:    Days per week: Not on file    Minutes per session: Not on file  . Stress: Not on file  Relationships  . Social connections:    Talks on phone: Not on file    Gets together: Not on file    Attends religious service: Not on file    Active member of club or organization: Not on file    Attends meetings of clubs or organizations: Not on file    Relationship status: Not on file  . Intimate partner violence:    Fear of current or ex partner: Not on file    Emotionally abused: Not on file    Physically abused: Not on file    Forced sexual activity: Not on file  Other Topics Concern  . Not on file  Social History Narrative   [employer]  in [place]. Married '90 - marriage is in very good shape '12, No children.  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 Offerred medical services for them if needed (Nov '11)   Patient is married [name]) and lives at home with her husband.   Patient is working Administrator, arts work.   Patient has a high school education.   Patient is right-handed.   Patient does not drink any caffeine.           PHYSICAL EXAM:  VS: BP 124/82   Pulse 81   Temp 98.3 F (36.8 C) (Oral)   Ht 5' 1.5" (1.562 m)   Wt 101 lb (45.8 kg)   SpO2 98%   BMI 18.77 kg/m  Physical Exam Gen: NAD, alert, cooperative with exam, well-appearing ENT: normal lips, normal nasal mucosa, tympanic membranes clear and intact bilaterally, normal oropharynx, no cervical lymphadenopathy Eye: normal EOM, normal conjunctiva and lids CV:  no edema, +2 pedal pulses, regular rate and rhythm, S1-S2   Resp: no accessory muscle use, non-labored, clear to auscultation bilaterally, no crackles  or wheezes Skin: no rashes, no areas of induration  Neuro: normal tone, normal sensation to touch Psych:  normal insight, alert and oriented MSK: Normal gait, normal strength       ASSESSMENT & PLAN:   Sore throat Likely viral in nature.  Rapid strep is negative. -Counseled on supportive care. -Given indications  to follow-up and return.

## 2018-06-13 NOTE — Patient Instructions (Addendum)
Nice to meet you  Please try things such as zyrtec-D or allegra-D which is an antihistamine and decongestant.  Please try afrin which will help with nasal congestion but use for only three days.  Please also try using a netti pot on a regular occasion. Honey, lozenges, chloraseptic spray can help with a sore throat.

## 2018-06-13 NOTE — Telephone Encounter (Signed)
Pt returning call

## 2018-06-13 NOTE — Telephone Encounter (Signed)
No answer. No voicemail. 

## 2018-06-13 NOTE — Assessment & Plan Note (Signed)
Likely viral in nature.  Rapid strep is negative. -Counseled on supportive care. -Given indications to follow-up and return.

## 2018-06-14 ENCOUNTER — Encounter: Payer: Self-pay | Admitting: Internal Medicine

## 2018-06-14 NOTE — Assessment & Plan Note (Signed)
Dexilant 

## 2018-06-14 NOTE — Assessment & Plan Note (Signed)
Nasonex Treat GERD

## 2018-06-16 ENCOUNTER — Ambulatory Visit: Payer: Medicare Other | Admitting: Family Medicine

## 2018-06-16 DIAGNOSIS — J069 Acute upper respiratory infection, unspecified: Secondary | ICD-10-CM | POA: Diagnosis not present

## 2018-06-16 DIAGNOSIS — J301 Allergic rhinitis due to pollen: Secondary | ICD-10-CM | POA: Diagnosis not present

## 2018-06-16 DIAGNOSIS — R21 Rash and other nonspecific skin eruption: Secondary | ICD-10-CM | POA: Diagnosis not present

## 2018-06-16 DIAGNOSIS — J3089 Other allergic rhinitis: Secondary | ICD-10-CM | POA: Diagnosis not present

## 2018-06-17 ENCOUNTER — Ambulatory Visit (INDEPENDENT_AMBULATORY_CARE_PROVIDER_SITE_OTHER): Payer: Medicare Other | Admitting: Family Medicine

## 2018-06-17 ENCOUNTER — Encounter: Payer: Self-pay | Admitting: Family Medicine

## 2018-06-17 VITALS — BP 104/68 | HR 71 | Temp 98.4°F | Ht 61.5 in | Wt 100.2 lb

## 2018-06-17 DIAGNOSIS — R6889 Other general symptoms and signs: Secondary | ICD-10-CM

## 2018-06-17 LAB — POCT INFLUENZA A/B
INFLUENZA B, POC: NEGATIVE
Influenza A, POC: NEGATIVE

## 2018-06-17 MED ORDER — CEFDINIR 300 MG PO CAPS: 300.0000 mg | ORAL_CAPSULE | Freq: Two times a day (BID) | ORAL | 0 refills | Status: DC

## 2018-06-17 NOTE — Progress Notes (Signed)
   Subjective:  Danikah Budzik is a 67 y.o. female who presents today for same-day appointment with a chief complaint of couhh.   HPI:  Cough, Acute problem Started about a week ago. Stable over that time. Associated with sore throat, malaise and myalgias.  She had 1 day where she had several episodes of vomiting diarrhea about a week ago however the symptoms have since resolved.  No abdominal pain.  She was concerned that symptoms could potentially be related to allergies.  She has been using over-the-counter nasal spray and Claritin with some improvement in her symptoms.  No known sick contacts. No other obvious alleviating or aggravating factors.   ROS: Per HPI  Objective:  Physical Exam: BP 104/68 (BP Location: Left Arm, Patient Position: Sitting, Cuff Size: Normal)   Pulse 71   Temp 98.4 F (36.9 C) (Oral)   Ht 5' 1.5" (1.562 m)   Wt 100 lb 4 oz (45.5 kg)   SpO2 99%   BMI 18.64 kg/m   Wt Readings from Last 3 Encounters:  06/17/18 100 lb 4 oz (45.5 kg)  06/13/18 101 lb (45.8 kg)  05/29/18 100 lb (45.4 kg)  Gen: NAD, resting comfortably HEENT: TMs with clear effusion bilaterally.  OP slightly erythematous with no exudate.  Nasal mucosa normal-appearing. CV: RRR with no murmurs appreciated Pulm: NWOB, CTAB with no crackles, wheezes, or rhonchi  Results for orders placed or performed in visit on 06/17/18 (from the past 24 hour(s))  POCT Influenza A/B     Status: Normal   Collection Time: 06/17/18  8:26 AM  Result Value Ref Range   Influenza A, POC Negative Negative   Influenza B, POC Negative Negative     Assessment/Plan:  Cough Rapid flu negative.  No signs of bacterial infection.  Symptoms likely secondary to viral infection.  She has no red flags.  Reassured patient.  She will continue using her intranasal steroid and Zyrtec.  Encouraged good oral hydration.  Sent in a "pocket prescription for Omnicef with strict instruction to not start unless symptoms worsen or do  not improve over the next few days. Return precautions and reasons to seek emergent care reviewed. Follow up as needed.   Algis Greenhouse. Jerline Pain, MD 06/17/2018 8:53 AM

## 2018-06-17 NOTE — Patient Instructions (Signed)
Your flu test is negative. You have a viral infection. Please continue the nasal spray and zyrtec.   Start the cefdinir if your symptoms worsen or do not improve in a few days.  Please stay well hydrated.  You can take tylenol and/or motrin as needed for low grade fever and pain.  Please let me know if your symptoms worsen or fail to improve.  Take care, Dr Jerline Pain

## 2018-06-17 NOTE — Telephone Encounter (Signed)
Spoke with the patient. She feels she is doing better on Dexilant. She encouraged to call us if that changes.

## 2018-06-20 DIAGNOSIS — J301 Allergic rhinitis due to pollen: Secondary | ICD-10-CM | POA: Diagnosis not present

## 2018-06-20 DIAGNOSIS — J3089 Other allergic rhinitis: Secondary | ICD-10-CM | POA: Diagnosis not present

## 2018-06-23 ENCOUNTER — Ambulatory Visit: Payer: Medicare Other | Admitting: Family Medicine

## 2018-06-27 DIAGNOSIS — J301 Allergic rhinitis due to pollen: Secondary | ICD-10-CM | POA: Diagnosis not present

## 2018-06-27 DIAGNOSIS — J3089 Other allergic rhinitis: Secondary | ICD-10-CM | POA: Diagnosis not present

## 2018-06-30 DIAGNOSIS — J301 Allergic rhinitis due to pollen: Secondary | ICD-10-CM | POA: Diagnosis not present

## 2018-06-30 DIAGNOSIS — J3089 Other allergic rhinitis: Secondary | ICD-10-CM | POA: Diagnosis not present

## 2018-07-11 DIAGNOSIS — J3089 Other allergic rhinitis: Secondary | ICD-10-CM | POA: Diagnosis not present

## 2018-07-11 DIAGNOSIS — J301 Allergic rhinitis due to pollen: Secondary | ICD-10-CM | POA: Diagnosis not present

## 2018-07-16 DIAGNOSIS — J301 Allergic rhinitis due to pollen: Secondary | ICD-10-CM | POA: Diagnosis not present

## 2018-07-16 DIAGNOSIS — J3089 Other allergic rhinitis: Secondary | ICD-10-CM | POA: Diagnosis not present

## 2018-07-21 DIAGNOSIS — J3089 Other allergic rhinitis: Secondary | ICD-10-CM | POA: Diagnosis not present

## 2018-07-21 DIAGNOSIS — J301 Allergic rhinitis due to pollen: Secondary | ICD-10-CM | POA: Diagnosis not present

## 2018-08-01 ENCOUNTER — Telehealth: Payer: Self-pay

## 2018-08-01 ENCOUNTER — Encounter: Payer: Self-pay | Admitting: Cardiology

## 2018-08-01 NOTE — Telephone Encounter (Signed)
Pt presented to the lobby this morning with a c/o her heart racing for "about 60 minutes" this morning. She states Dr Julianne Handler told her that if this happens, she should come to the office for evaluation.   I advised pt [facility] does not accept walk-in appointments and unfortunately at this time we have no openings today. I offered pt an appointment on Monday 2/17 with an APP, which she accepted and scheduled. Pt was advised to seek care at the ED this weekend if she has another sustained episode. She has verbalized understanding and had no additional questions.

## 2018-08-01 NOTE — Telephone Encounter (Signed)
Spoke with the patient, I advised her that caffeine could play a role, since she does not normally use it. I advised her that if she has more events to let our office know. She stated that the events are infrequent.

## 2018-08-01 NOTE — Telephone Encounter (Signed)
Patient called and asked if the chocolate she has eaten over the past few days would have attributed to her rapid heartbeat this morning. She also said she was exercising at the gym before she came to the office

## 2018-08-01 NOTE — Telephone Encounter (Signed)
Thanks

## 2018-08-04 ENCOUNTER — Ambulatory Visit (INDEPENDENT_AMBULATORY_CARE_PROVIDER_SITE_OTHER): Payer: Medicare Other | Admitting: Cardiology

## 2018-08-04 ENCOUNTER — Encounter: Payer: Self-pay | Admitting: Cardiology

## 2018-08-04 VITALS — BP 122/66 | HR 73 | Ht 61.5 in | Wt 101.0 lb

## 2018-08-04 DIAGNOSIS — I351 Nonrheumatic aortic (valve) insufficiency: Secondary | ICD-10-CM

## 2018-08-04 DIAGNOSIS — R002 Palpitations: Secondary | ICD-10-CM

## 2018-08-04 MED ORDER — FLECAINIDE ACETATE 50 MG PO TABS: 100.0000 mg | ORAL_TABLET | ORAL | 3 refills | Status: DC | PRN

## 2018-08-04 NOTE — Patient Instructions (Addendum)
Medication Instructions:  1.) START: Flecainide 50 mg as needed for fast heart beat     If you need a refill on your cardiac medications before your next appointment, please call your pharmacy.   Lab work: TODAY: BMET, MAG & TSH  If you have labs (blood work) drawn today and your tests are completely normal, you will receive your results only by: [name] MyChart Message (if you have MyChart) OR . A paper copy in the mail If you have any lab test that is abnormal or we need to change your treatment, we will call you to review the results.  Testing/Procedures: Your physician has requested that you have an echocardiogram. Echocardiography is a painless test that uses sound waves to create images of your heart. It provides your doctor with information about the size and shape of your heart and how well your heart's chambers and valves are working. This procedure takes approximately one hour. There are no restrictions for this procedure.   Your physician has recommended that you wear an event monitor. Event monitors are medical devices that record the heart's electrical activity. Doctors most often [place] these monitors to diagnose arrhythmias. Arrhythmias are problems with the speed or rhythm of the heartbeat. The monitor is a small, portable device. You can wear one while you do your normal daily activities. This is usually used to diagnose what is causing palpitations/syncope (passing out).    Follow-Up: Keep follow up appointment with Dr. Angelena Form on 09/11/2018 @ 9:20 AM  Any Other Special Instructions Will Be Listed Below (If Applicable).   Supraventricular Tachycardia, Adult Supraventricular tachycardia (SVT) is a kind of abnormal heartbeat. It makes your heart beat very fast and then beat at a normal speed. A normal resting heartbeat is 60-100 times a minute. This condition can make your heart beat more than 150 times a minute. Times of having a fast heartbeat (episodes) can be scary, but they  are usually not dangerous. However, in some cases, they can lead to heart failure if:  They happen several times per day.  Last for longer than a few seconds. What are the causes? Usually, a normal heartbeat starts when an area called the sinoatrial node releases an electrical signal. In SVT, other areas of the heart send out electrical signals that interfere with the signal from the sinoatrial node. What increases the risk?  Being [age] years old.  Being a woman. The following factors may make you more likely to develop this condition:  Stress.  Tiredness.  Smoking.  Stimulant drugs, such as cocaine and methamphetamine  Alcohol.  Caffeine.  Pregnancy.  Anxiety. What are the signs or symptoms?  A pounding heart. ? A feeling that your heart is skipping beats (palpitations). ? Weakness. ? Trouble getting enough air. ? Pain or tightness in your chest. ? Feeling like you are going to pass out. ? Feeling worried or nervous. ? Dizziness. ? Sweating. ? Feeling like you might throw up. ? Passing out (fainting). ? Tiredness. Sometimes, there are no symptoms. How is this treated?  Vagal nerve stimulation. Ways to do this: ? Holding your breath and pushing, as though you are pooping. ? Massaging an area on one side of your neck. Do not try this yourself. Only a doctor should do this. If done the wrong way, it can lead to a stroke. ? Bending forward with your head between your legs. ? Coughing while bending forward with your head between your legs. ? Closing your eyes and massaging your  eyeballs. Ask a doctor how to do this.  Medicines that prevent attacks.  Medicine to stop an attack given through an IV at the hospital.  A small electric shock (cardioversion) that stops an attack.  Radiofrequency ablation. In this procedure, a small, thin tube (catheter) is used to send radiofrequency energy to the area that is causing the rapid heartbeats. Follow these instructions  at home: Stress   Avoid things that make you feel stressed.  To deal with stress, try: ? Doing yoga, meditation, or being out in nature. ? Listening to relaxing music. ? Doing deep breathing. ? Taking steps to be healthy, such as getting lots of sleep, exercising, and eating a balanced diet. ? Talking with a mental health doctor. Sleep  Try to get at least 7 hours of sleep each night. Tobacco and nicotine   Do not use any products that contain nicotine or tobacco, such as cigarettes, e-cigarettes, and chewing tobacco. If you need help quitting, ask your doctor. Alcohol  If alcohol gives you a fast heartbeat, do not drink alcohol.  Even in alcohol does not seem to give you a fast heartbeat, limit alcohol use,. ? For nonpregnant women, this means no more than 1 drink a day. ? For men, this means no more than 2 drinks a day.  "One drink" means one of these:  12 oz of beer (355 mL).  5 oz of wine (148 mL)  1 oz of hard liquor (44 mL). Caffeine  If caffeine gives you a fast heartbeat, do not eat, drink, or use anything with caffeine in it.  Even if caffeine does not seem to give you a fast heartbeat, limit how much caffeine you eat, drink, or use. Stimulant drugs  Do not use drugs such as cocaine or methamphetamine. If you need help quitting, ask your doctor. General instructions  Stay at a healthy weight.  Exercise regularly. Ask your doctor about good activities for you. Try one of these: ? 150 minutes a week of gentle exercise, like walking or yoga. ? 75 minutes a week of exercise that is very active, like running or swimming.  Try a combination of gentle exercise and very active exercise.  Do home treatments to slow down your heartbeat.  Take over-the-counter and prescription medicines only as told by your doctor. Contact a doctor if:  You have a fast heartbeat more often.  Times of having a fast heartbeat last longer than before.  Home treatments to slow  down your heartbeat do not help.  You have new symptoms. Get help right away if:  You have chest pain.  Your symptoms get worse.  You have trouble breathing.  Your heart beats very fast for more than 20 minutes.  You pass out. These symptoms may be an emergency. Do not wait to see if the symptoms will go away. Get medical help right away. Call your local emergency services (911 in the [place] Do not drive yourself to the hospital. Summary  SVT is a type of abnormal heart beat.  During an episode, our heart rate may be higher than 150 beats per minute  Treatment depends on how often the condition happens and your symptoms. This information is not intended to replace advice given to you by your health care provider. Make sure you discuss any questions you have with your health care provider. Document Released: 06/04/2005 Document Revised: 02/09/2016 Document Reviewed: 02/09/2016 Elsevier Interactive Patient Education  2019 [employer].

## 2018-08-04 NOTE — Progress Notes (Addendum)
Cardiology Office Note:    Date:  08/04/2018   ID:  [name], DOB 10/04/1950, MRN [id number]  PCP:  Cassandria Anger, MD  Cardiologist:  Lauree Chandler, MD  Referring MD: Cassandria Anger, MD   Chief Complaint  Patient presents with  . Palpitations    History of Present Illness:    [name] is a 68 y.o. female with a past medical history significant for premature atrial contractions, palpitations, vasovagal syncope, mild aortic valve insufficiency, mild mitral regurgitation, IBS, GERD. Echo 09/07/13 with with mild AI, mild MR, normal LV function. Exercise stress test 05/07/14 with good exercise tolerance, no ischemic EKG changes and no chest pain with exercise. Most recent echo February 2019 with normal LV function, mild AI.   [name] is here today for evaluation of fast heart beat.   On 2/14 she was at [employer] exercising she developed racing heart beat. It continued even with rest. It lasted about about 45 minutes. She came to our office and while she waited in lobby it stopped. She had no chest pain/pressure or lightheadedness or shortness of breath. This is the longest episode that she has had. She also had eaten chocolate for 2 days prior which she does not usually do. She has had about 3 episodes in the last month.  Fast and regular.   On 06/05/18 she started dexalant for reflux. A week later on 12/26 she had an episode of heart racing. She has not taken it for the last 3 days.  She has been found to have low B12 and has been treated, but then it got high so her dose was reduced. She wonders if her B12 is low again. She wander if the Dexilant or low B12 has contributed to her palpitations.   She had a swallow test that was normal.   She has benign fasciculations in her thigh and these were also worse with the dexilant.   Past Medical History:  Diagnosis Date  . Allergic rhinitis, cause unspecified   . Backache, unspecified   . Benign  fasciculation-cramp syndrome 12/03/2012    Worsening with  stress, fatigue .  [name] Calculus of kidney   . Conversion disorder   . Cotton wool spots   . Esophageal reflux   . Floater, vitreous, left 05/2016  . GERD (gastroesophageal reflux disease)   . Hemorrhage of rectum and anus   . Internal hemorrhoids without mention of complication   . Irritable bowel syndrome   . Microscopic hematuria   . Migraine headache with aura   . Mild aortic insufficiency   . Mild tricuspid regurgitation   . Osteopenia   . Other plastic surgery for unacceptable cosmetic appearance    Inj tx fllers/expander  . Personal history of urinary calculi   . Postmenopausal atrophic vaginitis   . Premature atrial contractions   . PVC (premature ventricular contraction)    a. Event monitor 2013.  [name] Unspecified constipation   . Vasovagal syncope     Past Surgical History:  Procedure Laterality Date  . BREAST BIOPSY Right   . Cosmetic Procedures w/Injection therapy    . SKIN CANCER EXCISION Left    thigh  . WISDOM TOOTH EXTRACTION      Current Medications: Current Meds  Medication Sig  . ALPRAZolam (XANAX) 0.25 MG tablet 0.5-1 po bid prn anxiety  . ALREX 0.2 % SUSP Apply 0.2 drops to eye as needed (allergies).   . Botulinum Toxin Type A, Cosm, (BOTOX COSMETIC) 50  UNITS SOLR Inject 4-6 Units into the muscle. 3 month  injection  . cefdinir (OMNICEF) 300 MG capsule Take 1 capsule (300 mg total) by mouth 2 (two) times daily.  [name] conjugated estrogens (PREMARIN) vaginal cream Use PV once every 2-3 days  . Cyanocobalamin (VITAMIN B-12) 1000 MCG SUBL Place 1 tablet (1,000 mcg total) under the tongue every other day.  [name] dexlansoprazole (DEXILANT) 60 MG capsule Take 1 capsule (60 mg total) by mouth daily.  [name] EPIPEN 2-PAK 0.3 MG/0.3ML SOAJ injection See admin instructions. Reported on 10/08/2015  . fluocinonide cream (LIDEX) 3.41 % Apply 1 application topically 2 (two) times daily.  . hyoscyamine (LEVSIN SL) 0.125 MG SL  tablet Place 1 tablet (0.125 mg total) under the tongue every 4 (four) hours as needed (for throat tightness).  [name] loratadine (CLARITIN) 10 MG tablet Take 1 tablet (10 mg total) by mouth daily.  . mometasone (NASONEX) 50 MCG/ACT nasal spray Place 2 sprays into the nose daily.  [name] tretinoin (RETIN-A) 0.05 % cream Apply 1 application topically at bedtime.  . triamcinolone cream (KENALOG) 0.1 % APPLY TO AFFECTED AREA 3 TIMES A DAY     Allergies:   Codeine; Doxycycline; Epinephrine; Neomycin; Penicillins; Protonix [pantoprazole sodium]; Sulfonamide derivatives; and Zithromax [azithromycin]   Social History   Socioeconomic History  . Marital status: Married    Spouse name: [name]  . Number of children: 0  . Years of education: 45  . Highest education level: Not on file  Occupational History  . Occupation: OFFICE liasion    Employer: [employer]  . Occupation: Engineer, structural  . Occupation: OFFICE LEAZON    Employer: [employer]  Social Needs  . Financial resource strain: Not on file  . Food insecurity:    Worry: Not on file    Inability: Not on file  . Transportation needs:    Medical: Not on file    Non-medical: Not on file  Tobacco Use  . Smoking status: Never Smoker  . Smokeless tobacco: Never Used  Substance and Sexual Activity  . Alcohol use: No  . Drug use: No  . Sexual activity: Not on file  Lifestyle  . Physical activity:    Days per week: Not on file    Minutes per session: Not on file  . Stress: Not on file  Relationships  . Social connections:    Talks on phone: Not on file    Gets together: Not on file    Attends religious service: Not on file    Active member of club or organization: Not on file    Attends meetings of clubs or organizations: Not on file    Relationship status: Not on file  Other Topics Concern  . Not on file  Social History Narrative   [employer]  in [place]. Married '90 - marriage is in very good shape '12, No  children.  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 Offerred medical services for them if needed (Nov '11)   Patient is married [name]) and lives at home with her husband.   Patient is working Administrator, arts work.   Patient has a high school education.   Patient is right-handed.   Patient does not drink any caffeine.           Family History: The patient's family history includes COPD in her father; Hyperlipidemia in her father and mother; Hypertension  in her father and mother. There is no history of Diabetes, Colon cancer, Breast cancer, Coronary artery disease, Esophageal cancer, Stomach cancer, Pancreatic cancer, or Liver disease. ROS:   Please see the history of present illness.     All other systems reviewed and are negative.  EKGs/Labs/Other Studies Reviewed:    The following studies were reviewed today:  Echocardiogram 08/12/2017 Study Conclusions  - Left ventricle: The cavity size was normal. Wall thickness was   normal. Systolic function was normal. The estimated ejection   fraction was in the [name] of 60% to 65%. Wall motion was normal;   there were no regional wall motion abnormalities. Left   ventricular diastolic function parameters were normal. - Aortic valve: There was mild regurgitation.   EKG:  EKG is ordered today.  The ekg ordered today demonstrates Sinus rhythm with PACs, 73 bpm, Rightward axis. QTC 398  Recent Labs: 03/03/2018: ALT 13; Hemoglobin 13.1; Platelets 215.0 08/04/2018: BUN 22; Creatinine, Ser 0.87; Magnesium 2.1; Potassium 4.4; Sodium 145; TSH 1.770   Recent Lipid Panel    Component Value Date/Time   CHOL 204 (H) 03/03/2018 1632   TRIG 197.0 (H) 03/03/2018 1632   TRIG 66 06/25/2006 0741   HDL 66.70 03/03/2018 1632   CHOLHDL 3 03/03/2018 1632   VLDL 39.4 03/03/2018 1632   LDLCALC 98 03/03/2018 1632    Physical Exam:    VS:  BP 122/66   Pulse 73    Ht 5' 1.5" (1.562 m)   Wt 101 lb (45.8 kg)   BMI 18.77 kg/m     Wt Readings from Last 3 Encounters:  08/04/18 101 lb (45.8 kg)  06/17/18 100 lb 4 oz (45.5 kg)  06/13/18 101 lb (45.8 kg)     Physical Exam  Constitutional: She is oriented to person, place, and time. She appears well-developed and well-nourished. No distress.  HENT:  Head: Normocephalic and atraumatic.  Neck: Normal [name] of motion. Neck supple. No JVD present.  Cardiovascular: Normal rate, regular rhythm, normal heart sounds and intact distal pulses. Exam reveals no gallop and no friction rub.  No murmur heard. Pulmonary/Chest: Effort normal and breath sounds normal. No respiratory distress. She has no wheezes. She has no rales.  Abdominal: Soft. Bowel sounds are normal.  Musculoskeletal: Normal [name] of motion.        General: No deformity or edema.  Neurological: She is alert and oriented to person, place, and time.  Skin: Skin is warm and dry.  Psychiatric: She has a normal mood and affect. Her behavior is normal. Judgment and thought content normal.  Vitals reviewed.    ASSESSMENT:    1. Palpitations   2. Aortic valve insufficiency, etiology of cardiac valve disease unspecified    PLAN:    In order of problems listed above:  1. Palpitations -She is known to have PACs and PVCS but she has had 3 episodes or prolonged fast/regular heart beats, the longest was ~45 minutes. No associated dizziness or syncope.  -This could represent SVT but would expect her to have some symptoms with the amount of time in it. It could also be afib or more likely aflutter since it felt regular to her. It also could have been just an increase in her PAC's/PVC's causing her to notice them more. She recently started Dexilant and feels that her symptoms became worse with that. She is going to try to go back to another PPI that she tolerated in the past.  -Will check 30 day monitor  and provide pill in a pocket. Pt also taught vagal  maneuver.  -If she has proven afib or flutter we would need to consider anticoagulation (CHA2DS2/VAS Stroke Risk Score 2 for being female and now age 59).  2. Aortic valve insufficiency -With worsened palpitations, will recheck echo for change in valve disease.    Medication Adjustments/Labs and Tests Ordered: Current medicines are reviewed at length with the patient today.  Concerns regarding medicines are outlined above. Labs and tests ordered and medication changes are outlined in the patient instructions below:  Patient Instructions  Medication Instructions:  1.) START: Flecainide 50 mg as needed for fast heart beat     If you need a refill on your cardiac medications before your next appointment, please call your pharmacy.   Lab work: TODAY: BMET, MAG & TSH  If you have labs (blood work) drawn today and your tests are completely normal, you will receive your results only by: [name] MyChart Message (if you have MyChart) OR . A paper copy in the mail If you have any lab test that is abnormal or we need to change your treatment, we will call you to review the results.  Testing/Procedures: Your physician has requested that you have an echocardiogram. Echocardiography is a painless test that uses sound waves to create images of your heart. It provides your doctor with information about the size and shape of your heart and how well your heart's chambers and valves are working. This procedure takes approximately one hour. There are no restrictions for this procedure.   Your physician has recommended that you wear an event monitor. Event monitors are medical devices that record the heart's electrical activity. Doctors most often [place] these monitors to diagnose arrhythmias. Arrhythmias are problems with the speed or rhythm of the heartbeat. The monitor is a small, portable device. You can wear one while you do your normal daily activities. This is usually used to diagnose what is causing  palpitations/syncope (passing out).    Follow-Up: Keep follow up appointment with Dr. Angelena Form on 09/11/2018 @ 9:20 AM  Any Other Special Instructions Will Be Listed Below (If Applicable).   Supraventricular Tachycardia, Adult Supraventricular tachycardia (SVT) is a kind of abnormal heartbeat. It makes your heart beat very fast and then beat at a normal speed. A normal resting heartbeat is 60-100 times a minute. This condition can make your heart beat more than 150 times a minute. Times of having a fast heartbeat (episodes) can be scary, but they are usually not dangerous. However, in some cases, they can lead to heart failure if:  They happen several times per day.  Last for longer than a few seconds. What are the causes? Usually, a normal heartbeat starts when an area called the sinoatrial node releases an electrical signal. In SVT, other areas of the heart send out electrical signals that interfere with the signal from the sinoatrial node. What increases the risk?  Being 12?66 years old.  Being a woman. The following factors may make you more likely to develop this condition:  Stress.  Tiredness.  Smoking.  Stimulant drugs, such as cocaine and methamphetamine  Alcohol.  Caffeine.  Pregnancy.  Anxiety. What are the signs or symptoms?  A pounding heart. ? A feeling that your heart is skipping beats (palpitations). ? Weakness. ? Trouble getting enough air. ? Pain or tightness in your chest. ? Feeling like you are going to pass out. ? Feeling worried or nervous. ? Dizziness. ? Sweating. ? Feeling like  you might throw up. ? Passing out (fainting). ? Tiredness. Sometimes, there are no symptoms. How is this treated?  Vagal nerve stimulation. Ways to do this: ? Holding your breath and pushing, as though you are pooping. ? Massaging an area on one side of your neck. Do not try this yourself. Only a doctor should do this. If done the wrong way, it can lead to a  stroke. ? Bending forward with your head between your legs. ? Coughing while bending forward with your head between your legs. ? Closing your eyes and massaging your eyeballs. Ask a doctor how to do this.  Medicines that prevent attacks.  Medicine to stop an attack given through an IV at the hospital.  A small electric shock (cardioversion) that stops an attack.  Radiofrequency ablation. In this procedure, a small, thin tube (catheter) is used to send radiofrequency energy to the area that is causing the rapid heartbeats. Follow these instructions at home: Stress   Avoid things that make you feel stressed.  To deal with stress, try: ? Doing yoga, meditation, or being out in nature. ? Listening to relaxing music. ? Doing deep breathing. ? Taking steps to be healthy, such as getting lots of sleep, exercising, and eating a balanced diet. ? Talking with a mental health doctor. Sleep  Try to get at least 7 hours of sleep each night. Tobacco and nicotine   Do not use any products that contain nicotine or tobacco, such as cigarettes, e-cigarettes, and chewing tobacco. If you need help quitting, ask your doctor. Alcohol  If alcohol gives you a fast heartbeat, do not drink alcohol.  Even in alcohol does not seem to give you a fast heartbeat, limit alcohol use,. ? For nonpregnant women, this means no more than 1 drink a day. ? For men, this means no more than 2 drinks a day.  "One drink" means one of these:  12 oz of beer (355 mL).  5 oz of wine (148 mL)  1 oz of hard liquor (44 mL). Caffeine  If caffeine gives you a fast heartbeat, do not eat, drink, or use anything with caffeine in it.  Even if caffeine does not seem to give you a fast heartbeat, limit how much caffeine you eat, drink, or use. Stimulant drugs  Do not use drugs such as cocaine or methamphetamine. If you need help quitting, ask your doctor. General instructions  Stay at a healthy weight.  Exercise  regularly. Ask your doctor about good activities for you. Try one of these: ? 150 minutes a week of gentle exercise, like walking or yoga. ? 75 minutes a week of exercise that is very active, like running or swimming.  Try a combination of gentle exercise and very active exercise.  Do home treatments to slow down your heartbeat.  Take over-the-counter and prescription medicines only as told by your doctor. Contact a doctor if:  You have a fast heartbeat more often.  Times of having a fast heartbeat last longer than before.  Home treatments to slow down your heartbeat do not help.  You have new symptoms. Get help right away if:  You have chest pain.  Your symptoms get worse.  You have trouble breathing.  Your heart beats very fast for more than 20 minutes.  You pass out. These symptoms may be an emergency. Do not wait to see if the symptoms will go away. Get medical help right away. Call your local emergency services (911 in the [place]  Do not drive yourself to the hospital. Summary  SVT is a type of abnormal heart beat.  During an episode, our heart rate may be higher than 150 beats per minute  Treatment depends on how often the condition happens and your symptoms. This information is not intended to replace advice given to you by your health care provider. Make sure you discuss any questions you have with your health care provider. Document Released: 06/04/2005 Document Revised: 02/09/2016 Document Reviewed: 02/09/2016 Elsevier Interactive Patient Education  2019 [place], Daune Perch, NP  08/06/2018 5:26 AM    [facility]

## 2018-08-05 DIAGNOSIS — J301 Allergic rhinitis due to pollen: Secondary | ICD-10-CM | POA: Diagnosis not present

## 2018-08-05 DIAGNOSIS — J3089 Other allergic rhinitis: Secondary | ICD-10-CM | POA: Diagnosis not present

## 2018-08-05 LAB — BASIC METABOLIC PANEL
BUN/Creatinine Ratio: 25 (ref 12–28)
BUN: 22 mg/dL (ref 8–27)
CO2: 22 mmol/L (ref 20–29)
Calcium: 9.5 mg/dL (ref 8.7–10.3)
Chloride: 106 mmol/L (ref 96–106)
Creatinine, Ser: 0.87 mg/dL (ref 0.57–1.00)
GFR calc Af Amer: 81 mL/min/{1.73_m2} (ref >59)
GFR calc non Af Amer: 70 mL/min/{1.73_m2} (ref >59)
Glucose: 89 mg/dL (ref 65–99)
Potassium: 4.4 mmol/L (ref 3.5–5.2)
Sodium: 145 mmol/L — ABNORMAL HIGH (ref 134–144)

## 2018-08-05 LAB — TSH: TSH: 1.77 u[IU]/mL (ref 0.450–4.500)

## 2018-08-05 LAB — MAGNESIUM: Magnesium: 2.1 mg/dL (ref 1.6–2.3)

## 2018-08-06 ENCOUNTER — Other Ambulatory Visit: Payer: Self-pay | Admitting: Internal Medicine

## 2018-08-06 DIAGNOSIS — E538 Deficiency of other specified B group vitamins: Secondary | ICD-10-CM

## 2018-08-06 NOTE — Progress Notes (Signed)
b12

## 2018-08-07 ENCOUNTER — Other Ambulatory Visit (INDEPENDENT_AMBULATORY_CARE_PROVIDER_SITE_OTHER): Payer: Medicare Other

## 2018-08-07 DIAGNOSIS — E538 Deficiency of other specified B group vitamins: Secondary | ICD-10-CM | POA: Diagnosis not present

## 2018-08-07 LAB — VITAMIN B12: VITAMIN B 12: 974 pg/mL — AB (ref 211–911)

## 2018-08-11 DIAGNOSIS — J3089 Other allergic rhinitis: Secondary | ICD-10-CM | POA: Diagnosis not present

## 2018-08-11 DIAGNOSIS — J301 Allergic rhinitis due to pollen: Secondary | ICD-10-CM | POA: Diagnosis not present

## 2018-08-18 DIAGNOSIS — J301 Allergic rhinitis due to pollen: Secondary | ICD-10-CM | POA: Diagnosis not present

## 2018-08-18 DIAGNOSIS — J3089 Other allergic rhinitis: Secondary | ICD-10-CM | POA: Diagnosis not present

## 2018-08-25 ENCOUNTER — Ambulatory Visit (INDEPENDENT_AMBULATORY_CARE_PROVIDER_SITE_OTHER): Payer: Medicare Other

## 2018-08-25 ENCOUNTER — Ambulatory Visit (HOSPITAL_COMMUNITY): Payer: Medicare Other | Attending: Cardiology

## 2018-08-25 DIAGNOSIS — J301 Allergic rhinitis due to pollen: Secondary | ICD-10-CM | POA: Diagnosis not present

## 2018-08-25 DIAGNOSIS — I351 Nonrheumatic aortic (valve) insufficiency: Secondary | ICD-10-CM | POA: Diagnosis not present

## 2018-08-25 DIAGNOSIS — R002 Palpitations: Secondary | ICD-10-CM

## 2018-08-25 DIAGNOSIS — J3089 Other allergic rhinitis: Secondary | ICD-10-CM | POA: Diagnosis not present

## 2018-08-26 DIAGNOSIS — L309 Dermatitis, unspecified: Secondary | ICD-10-CM | POA: Diagnosis not present

## 2018-08-26 DIAGNOSIS — L299 Pruritus, unspecified: Secondary | ICD-10-CM | POA: Diagnosis not present

## 2018-08-26 DIAGNOSIS — R002 Palpitations: Secondary | ICD-10-CM | POA: Diagnosis not present

## 2018-08-26 DIAGNOSIS — L821 Other seborrheic keratosis: Secondary | ICD-10-CM | POA: Diagnosis not present

## 2018-08-28 ENCOUNTER — Telehealth: Payer: Self-pay | Admitting: Nurse Practitioner

## 2018-08-28 ENCOUNTER — Other Ambulatory Visit: Payer: Self-pay

## 2018-08-28 ENCOUNTER — Ambulatory Visit (INDEPENDENT_AMBULATORY_CARE_PROVIDER_SITE_OTHER): Payer: Medicare Other | Admitting: Nurse Practitioner

## 2018-08-28 ENCOUNTER — Other Ambulatory Visit: Payer: Medicare Other

## 2018-08-28 ENCOUNTER — Encounter: Payer: Self-pay | Admitting: Nurse Practitioner

## 2018-08-28 VITALS — BP 130/70 | HR 76 | Temp 98.2°F | Ht 61.5 in | Wt 100.0 lb

## 2018-08-28 DIAGNOSIS — R8290 Unspecified abnormal findings in urine: Secondary | ICD-10-CM

## 2018-08-28 DIAGNOSIS — R109 Unspecified abdominal pain: Secondary | ICD-10-CM

## 2018-08-28 DIAGNOSIS — L259 Unspecified contact dermatitis, unspecified cause: Secondary | ICD-10-CM | POA: Diagnosis not present

## 2018-08-28 LAB — POCT URINALYSIS DIPSTICK
Bilirubin, UA: NEGATIVE
Glucose, UA: NEGATIVE
Ketones, UA: NEGATIVE
Leukocytes, UA: NEGATIVE
Nitrite, UA: NEGATIVE
Protein, UA: NEGATIVE
Spec Grav, UA: 1.015 (ref 1.010–1.025)
Urobilinogen, UA: 0.2 E.U./dL
pH, UA: 6 (ref 5.0–8.0)

## 2018-08-28 MED ORDER — TRIAMCINOLONE ACETONIDE 0.5 % EX OINT: 1.0000 "application " | TOPICAL_OINTMENT | Freq: Two times a day (BID) | CUTANEOUS | 0 refills | Status: DC

## 2018-08-28 NOTE — Progress Notes (Signed)
[name] is a 68 y.o. female with the following history as recorded in [employer]:  Patient Active Problem List   Diagnosis Date Noted  . Sore throat 06/13/2018  . Friction injury to skin 04/14/2018  . Occipital neuralgia 03/03/2018  . B12 deficiency 03/03/2018  . Ocular migraine 02/05/2018  . Leg wound, right 12/26/2017  . Bilateral impacted cerumen 10/21/2017  . LLQ abdominal pain 09/23/2017  . Elevated antinuclear antibody (ANA) level 07/26/2017  . Arthralgia 07/26/2017  . Degenerative disc disease, lumbar 07/04/2017  . Cough 06/06/2017  . Chest discomfort 03/21/2017  . Neck mass 01/17/2017  . Mass of neck 12/25/2016  . Snoring 12/18/2016  . Medial tibial stress syndrome, left, initial encounter 08/14/2016  . Patellofemoral syndrome of both knees 08/09/2016  . Drug allergy, antibiotic 05/31/2016  . Hyperkalemia 02/17/2016  . Vertical diplopia 02/15/2016  . Tension headache 12/28/2015  . BPPV (benign paroxysmal positional vertigo) 10/08/2015  . TMJ click 42/68/3419  . Cold sore 07/18/2015  . Osteopenia 07/18/2015  . Earache on left 07/18/2015  . Cervical disc disorder with radiculopathy of cervical region 06/17/2015  . Cotton wool spots 03/15/2015  . Left LBP 03/08/2015  . Food poisoning 03/08/2015  . Generalized anxiety disorder 03/08/2015  . Dysuria 02/23/2015  . Well adult exam 09/09/2014  . Internal hemorrhoids 09/07/2014  . Allergic urticaria 08/20/2014  . Acute sinusitis 08/18/2014  . Neck muscle spasm 07/30/2014  . Grief 06/17/2014  . Mild aortic insufficiency   . Mild tricuspid regurgitation   . Vasovagal syncope   . Premature atrial contractions   . GERD (gastroesophageal reflux disease)   . PVC (premature ventricular contraction)   . Dense breast tissue 03/23/2014  . Microhematuria 03/22/2014  . Changing skin lesion 02/05/2014  . Rash and nonspecific skin eruption 12/22/2013  . Chronic meniscal tear of knee 12/21/2013  . Benign fasciculations  12/11/2013  . Strain of adductor magnus muscle of left lower extremity 05/21/2013  . Benign fasciculation-cramp syndrome 12/03/2012  . Vasovagal near syncope 11/02/2012  . Migraine with aura 11/02/2012  . Upper airway cough syndrome 08/16/2012  . Chest tightness 09/21/2011  . Bunion of great toe 09/11/2011  . Routine health maintenance 09/11/2011  . Mild mitral regurgitation by prior echocardiogram 05/15/2011  . Chronic neck pain 03/07/2011  . Skin lesion 03/07/2011  . DYSPLASTIC NEVUS, [place] 02/06/2010  . HAND PAIN, BILATERAL 08/25/2009  . Aortic valve disorder 08/02/2009  . Abnormal involuntary movements(781.0) 07/28/2009  . LUMBAR SPRAIN AND STRAIN 05/21/2009  . Palpitations 02/03/2009  . HEMORRHOIDS-INTERNAL 12/15/2008  . Constipation 12/15/2008  . Irritable bowel syndrome 12/15/2008  . RECTAL BLEEDING 12/15/2008  . RENAL CALCULUS, HX OF 11/29/2007  . Herpes simplex without mention of complication 62/22/9798  . Vaginal atrophy 03/25/2007  . GLOBUS HYSTERICUS 01/01/2007  . RHINITIS, ALLERGIC NOS 01/01/2007    Current Outpatient Medications  Medication Sig Dispense Refill  . ALPRAZolam (XANAX) 0.25 MG tablet 0.5-1 po bid prn anxiety 30 tablet 1  . ALREX 0.2 % SUSP Apply 0.2 drops to eye as needed (allergies).     . Botulinum Toxin Type A, Cosm, (BOTOX COSMETIC) 50 UNITS SOLR Inject 4-6 Units into the muscle. 3 month  injection    . conjugated estrogens (PREMARIN) vaginal cream Use PV once every 2-3 days 90 g 2  . Cyanocobalamin (VITAMIN B-12) 1000 MCG SUBL Place 1 tablet (1,000 mcg total) under the tongue every other day. 100 tablet 3  . dexlansoprazole (DEXILANT) 60 MG capsule Take 1 capsule (60  mg total) by mouth daily. 30 capsule 11  . EPIPEN 2-PAK 0.3 MG/0.3ML SOAJ injection See admin instructions. Reported on 10/08/2015  1  . flecainide (TAMBOCOR) 50 MG tablet Take 2 tablets (100 mg total) by mouth as needed (Fast heart beat). 10 tablet 3  . fluocinonide cream (LIDEX) 9.38  % Apply 1 application topically 2 (two) times daily. 90 g 2  . hyoscyamine (LEVSIN SL) 0.125 MG SL tablet Place 1 tablet (0.125 mg total) under the tongue every 4 (four) hours as needed (for throat tightness). 30 tablet 3  . loratadine (CLARITIN) 10 MG tablet Take 1 tablet (10 mg total) by mouth daily. 30 tablet 11  . mometasone (NASONEX) 50 MCG/ACT nasal spray Place 2 sprays into the nose daily. 17 g 12  . tretinoin (RETIN-A) 0.05 % cream Apply 1 application topically at bedtime.    . triamcinolone ointment (KENALOG) 0.5 % Apply 1 application topically 2 (two) times daily. 30 g 0   No current facility-administered medications for this visit.     Allergies: Codeine; Doxycycline; Epinephrine; Neomycin; Penicillins; Protonix [pantoprazole sodium]; Sulfonamide derivatives; and Zithromax [azithromycin]  Past Medical History:  Diagnosis Date  . Allergic rhinitis, cause unspecified   . Backache, unspecified   . Benign fasciculation-cramp syndrome 12/03/2012    Worsening with  stress, fatigue .  [name] Calculus of kidney   . Conversion disorder   . Cotton wool spots   . Esophageal reflux   . Floater, vitreous, left July 09, 2016  . GERD (gastroesophageal reflux disease)   . Hemorrhage of rectum and anus   . Internal hemorrhoids without mention of complication   . Irritable bowel syndrome   . Microscopic hematuria   . Migraine headache with aura   . Mild aortic insufficiency   . Mild tricuspid regurgitation   . Osteopenia   . Other plastic surgery for unacceptable cosmetic appearance    Inj tx fllers/expander  . Personal history of urinary calculi   . Postmenopausal atrophic vaginitis   . Premature atrial contractions   . PVC (premature ventricular contraction)    a. Event monitor 2013.  [name] Unspecified constipation   . Vasovagal syncope     Past Surgical History:  Procedure Laterality Date  . BREAST BIOPSY Right   . Cosmetic Procedures w/Injection therapy    . SKIN CANCER EXCISION Left     thigh  . WISDOM TOOTH EXTRACTION      Family History  Problem Relation Age of Onset  . Hyperlipidemia Father   . COPD Father        Smoker, deceased 2014/07/09  . Hypertension Father   . Hyperlipidemia Mother   . Hypertension Mother   . Diabetes Neg Hx   . Colon cancer Neg Hx   . Breast cancer Neg Hx   . Coronary artery disease Neg Hx   . Esophageal cancer Neg Hx   . Stomach cancer Neg Hx   . Pancreatic cancer Neg Hx   . Liver disease Neg Hx     Social History   Tobacco Use  . Smoking status: Never Smoker  . Smokeless tobacco: Never Used  Substance Use Topics  . Alcohol use: No     Subjective:  Ms Augusta is here today for acute visit, CC: UTI symptoms, rash She reports mild pelvic and lower back pressure, dysuria, urinary odor x 3 days. She actually had a few episodes of diarrhea about 3 days ago, but none since. Denies fevers, chills, nausea, vomiting, hematuria, melena, vaginal discharge  or bleeding. Symptoms feel like when she had a UTI a few years ago She admits she often does not drink enough water throughout the day  She is also requesting evaluation of itchy rash to chest, where she had stickers placed for heart monitor, skin is red, dry and irritated.  She has not tried anything for her symptoms at home  ROS- See HPI  Objective:  Vitals:   08/28/18 1033  BP: 130/70  Pulse: 76  Temp: 98.2 F (36.8 C)  TempSrc: Oral  Weight: 100 lb (45.4 kg)  Height: 5' 1.5" (1.562 m)    General: Well developed, well nourished, in no acute distress  Skin : Warm and dry. Mildly erythematous rash to chest in shape of medical stickers Head: Normocephalic and atraumatic  Eyes: Sclera and conjunctiva clear; pupils round and reactive to light; extraocular movements intact  Oropharynx: Pink, supple. No suspicious lesions  Neck: Supple Lungs: Respirations unlabored; clear to auscultation bilaterally  CVS exam: normal rate and regular rhythm.  Abdomen: Soft; nontender;  nondistended; normoactive bowel sounds; no masses or hepatosplenomegaly;no CVA tenderness  Extremities: No edema, cyanosis, clubbing  Vessels: Symmetric bilaterally  Neurologic: Alert and oriented; speech intact; face symmetrical; moves all extremities well; CNII-XII intact without focal deficit  Psychiatric: Normal mood and affect.  Assessment:  1. Abnormal urine odor   2. Flank pain, acute   3. Contact dermatitis, unspecified contact dermatitis type, unspecified trigger     Plan:   1,2. Her visit was done while epic was down. She specifically requested cefdinir to treat UTI, as she reported taking in the past with no reaction, does have many antibiotic allergies, told me she actually had cefdinir at home which she planned to start if it was the right dose and not expired We discussed starting cefdinir 300mg  BID for 5 days while awaiting urine culture based on symptoms of UTI and trace blood in urine I am reaching out to her with phone call to clarify medication allergies prior to sending cefdinir or another antibiotic to treat her possible UTI Home management, hydration, red flags and return precautions including when to seek immediate care discussed and printed on AVS F/U with further recommendations pending culture results  3. triamcinolone course sent- dosing, side effects discussed Home management, red flags and return precautions including when to seek immediate care discussed and printed on AVS She will f/u for new, worsening symptoms if if symptoms persist   No follow-ups on file.  Orders Placed This Encounter  Procedures  . Urine Culture    Standing Status:   Future    Number of Occurrences:   1    Standing Expiration Date:   09/28/2018  . POCT urinalysis dipstick    Requested Prescriptions   Signed Prescriptions Disp Refills  . triamcinolone ointment (KENALOG) 0.5 % 30 g 0    Sig: Apply 1 application topically 2 (two) times daily.

## 2018-08-28 NOTE — Patient Instructions (Signed)
Discharge instructions given verbally as epic was down during pt visit

## 2018-08-28 NOTE — Telephone Encounter (Signed)
I called pt and advised of below. She states she does have Cefdinir 300 mg at home that was previously prescribed in 08/2017 and expires 09/2018. She states she did take one this afternoon. I advised her we will call her back once we have urine culture results. She asked if another Cefdinir script will be sent in.

## 2018-08-28 NOTE — Telephone Encounter (Signed)
We discussed antibiotic this morning for UTI, she told me that she had cefdinir at home she wanted to try, epic was down during her visit, I am now back online and the system is flagging cefdinir since she is allergic to penicillin. Is she sure she has taken cefdinir, has she already started taking the medication? Also please let her know that I have sent triamcinolone Rx for the rash on her chest

## 2018-08-29 ENCOUNTER — Telehealth: Payer: Self-pay | Admitting: Cardiovascular Disease

## 2018-08-29 ENCOUNTER — Ambulatory Visit: Payer: Medicare Other | Admitting: Family Medicine

## 2018-08-29 LAB — URINE CULTURE
MICRO NUMBER:: 311517
Result:: NO GROWTH
SPECIMEN QUALITY:: ADEQUATE

## 2018-08-29 NOTE — Telephone Encounter (Signed)
Echo reviewed by me and unchanged with normal LV size and function, normal RV size and function. Mild Aortic valve leakiness which is unchanged. [name]

## 2018-08-29 NOTE — Telephone Encounter (Signed)
Patient is calling for results to her ECHO.

## 2018-08-29 NOTE — Telephone Encounter (Signed)
I spoke with [name] and reviewed echo results with her.  She is currently wearing 30 day monitor. This was placed on 08/25/18.  [name] agreeable to moving appointment with Dr. Angelena Form from 09/11/18 to 10/06/18 at 3:20 so monitor results would be available.

## 2018-09-01 ENCOUNTER — Telehealth: Payer: Self-pay | Admitting: *Deleted

## 2018-09-01 DIAGNOSIS — J3089 Other allergic rhinitis: Secondary | ICD-10-CM | POA: Diagnosis not present

## 2018-09-01 DIAGNOSIS — J301 Allergic rhinitis due to pollen: Secondary | ICD-10-CM | POA: Diagnosis not present

## 2018-09-01 NOTE — Telephone Encounter (Signed)
Pt informed 08/28/18 urine culture was negative.   Copied from [place] [phone]. Topic: General - Other >> Aug 29, 2018  4:50 PM Leward Quan A wrote: Reason for CRM: Patient called in reference to the visit that she had on 08/28/2018 with Caesar Chestnut stated that Hollie [facility] was going to send an Rx to the pharmacy for cefdinir 300 mg capsule and she is asking for that Rx to be sent today if possible. She stated that she is not allergic to it. Please call Ph# [phone]

## 2018-09-01 NOTE — Telephone Encounter (Signed)
Scheduled to see her PCP on 09/02/2018.

## 2018-09-02 ENCOUNTER — Other Ambulatory Visit: Payer: Self-pay

## 2018-09-02 ENCOUNTER — Ambulatory Visit: Payer: Medicare Other | Admitting: Family Medicine

## 2018-09-02 ENCOUNTER — Ambulatory Visit (INDEPENDENT_AMBULATORY_CARE_PROVIDER_SITE_OTHER): Payer: Medicare Other | Admitting: Internal Medicine

## 2018-09-02 ENCOUNTER — Encounter: Payer: Self-pay | Admitting: Internal Medicine

## 2018-09-02 DIAGNOSIS — R21 Rash and other nonspecific skin eruption: Secondary | ICD-10-CM

## 2018-09-02 DIAGNOSIS — K582 Mixed irritable bowel syndrome: Secondary | ICD-10-CM

## 2018-09-02 DIAGNOSIS — R300 Dysuria: Secondary | ICD-10-CM | POA: Diagnosis not present

## 2018-09-02 DIAGNOSIS — G43109 Migraine with aura, not intractable, without status migrainosus: Secondary | ICD-10-CM

## 2018-09-02 NOTE — Assessment & Plan Note (Signed)
Try gluten free diet 

## 2018-09-02 NOTE — Progress Notes (Signed)
Subjective:  Patient ID: [name], female    DOB: 1950/12/27  Age: 67 y.o. MRN: [id number]  CC: No chief complaint on file.   HPI [name] presents for diarrhea, nausea, UTI and HAs f/u  Outpatient Medications Prior to Visit  Medication Sig Dispense Refill  . ALPRAZolam (XANAX) 0.25 MG tablet 0.5-1 po bid prn anxiety 30 tablet 1  . ALREX 0.2 % SUSP Apply 0.2 drops to eye as needed (allergies).     . Botulinum Toxin Type A, Cosm, (BOTOX COSMETIC) 50 UNITS SOLR Inject 4-6 Units into the muscle. 3 month  injection    . conjugated estrogens (PREMARIN) vaginal cream Use PV once every 2-3 days 90 g 2  . Cyanocobalamin (VITAMIN B-12) 1000 MCG SUBL Place 1 tablet (1,000 mcg total) under the tongue every other day. 100 tablet 3  . dexlansoprazole (DEXILANT) 60 MG capsule Take 1 capsule (60 mg total) by mouth daily. 30 capsule 11  . EPIPEN 2-PAK 0.3 MG/0.3ML SOAJ injection See admin instructions. Reported on 10/08/2015  1  . flecainide (TAMBOCOR) 50 MG tablet Take 2 tablets (100 mg total) by mouth as needed (Fast heart beat). 10 tablet 3  . fluocinonide cream (LIDEX) 6.76 % Apply 1 application topically 2 (two) times daily. 90 g 2  . hyoscyamine (LEVSIN SL) 0.125 MG SL tablet Place 1 tablet (0.125 mg total) under the tongue every 4 (four) hours as needed (for throat tightness). 30 tablet 3  . loratadine (CLARITIN) 10 MG tablet Take 1 tablet (10 mg total) by mouth daily. 30 tablet 11  . mometasone (NASONEX) 50 MCG/ACT nasal spray Place 2 sprays into the nose daily. 17 g 12  . tretinoin (RETIN-A) 0.05 % cream Apply 1 application topically at bedtime.    . triamcinolone ointment (KENALOG) 0.5 % Apply 1 application topically 2 (two) times daily. 30 g 0   No facility-administered medications prior to visit.     ROS: Review of Systems  Constitutional: Negative for activity change, appetite change, chills, fatigue and unexpected weight change.  HENT: Positive for voice change.  Negative for congestion, mouth sores and sinus pressure.   Eyes: Negative for visual disturbance.  Respiratory: Negative for cough and chest tightness.   Cardiovascular: Positive for palpitations.  Gastrointestinal: Positive for diarrhea and nausea. Negative for abdominal pain.  Genitourinary: Negative for difficulty urinating, frequency and vaginal pain.  Musculoskeletal: Negative for back pain and gait problem.  Skin: Negative for pallor and rash.  Neurological: Negative for dizziness, tremors, weakness, numbness and headaches.  Psychiatric/Behavioral: Negative for confusion and sleep disturbance. The patient is nervous/anxious.     Objective:  BP 116/78 (BP Location: Left Arm, Patient Position: Sitting, Cuff Size: Normal)   Pulse 82   Temp 98.2 F (36.8 C) (Oral)   Ht 5' 1.5" (1.562 m)   Wt 102 lb (46.3 kg)   SpO2 97%   BMI 18.96 kg/m   BP Readings from Last 3 Encounters:  09/02/18 116/78  08/28/18 130/70  08/04/18 122/66    Wt Readings from Last 3 Encounters:  09/02/18 102 lb (46.3 kg)  08/28/18 100 lb (45.4 kg)  08/04/18 101 lb (45.8 kg)    Physical Exam Constitutional:      General: She is not in acute distress.    Appearance: She is well-developed.  HENT:     Head: Normocephalic.     Right Ear: External ear normal.     Left Ear: External ear normal.     Nose: Nose  normal.  Eyes:     General:        Right eye: No discharge.        Left eye: No discharge.     Conjunctiva/sclera: Conjunctivae normal.     Pupils: Pupils are equal, round, and reactive to light.  Neck:     Musculoskeletal: Normal [name] of motion and neck supple.     Thyroid: No thyromegaly.     Vascular: No JVD.     Trachea: No tracheal deviation.  Cardiovascular:     Rate and Rhythm: Normal rate and regular rhythm.     Heart sounds: Normal heart sounds.  Pulmonary:     Effort: No respiratory distress.     Breath sounds: No stridor. No wheezing.  Abdominal:     General: Bowel sounds are  normal. There is no distension.     Palpations: Abdomen is soft. There is no mass.     Tenderness: There is no abdominal tenderness. There is no guarding or rebound.  Musculoskeletal:        General: No tenderness.  Lymphadenopathy:     Cervical: No cervical adenopathy.  Skin:    Findings: No erythema or rash.  Neurological:     Cranial Nerves: No cranial nerve deficit.     Motor: No abnormal muscle tone.     Coordination: Coordination normal.     Deep Tendon Reflexes: Reflexes normal.  Psychiatric:        Behavior: Behavior normal.        Thought Content: Thought content normal.        Judgment: Judgment normal.   heart monitor is on  Lab Results  Component Value Date   WBC 7.4 03/03/2018   HGB 13.1 03/03/2018   HCT 38.2 03/03/2018   PLT 215.0 03/03/2018   GLUCOSE 89 08/04/2018   CHOL 204 (H) 03/03/2018   TRIG 197.0 (H) 03/03/2018   HDL 66.70 03/03/2018   LDLCALC 98 03/03/2018   ALT 13 03/03/2018   AST 16 03/03/2018   NA 145 (H) 08/04/2018   K 4.4 08/04/2018   CL 106 08/04/2018   CREATININE 0.87 08/04/2018   BUN 22 08/04/2018   CO2 22 08/04/2018   TSH 1.770 08/04/2018    Dg Esophagus  Result Date: 06/02/2018 CLINICAL DATA:  Tightness in the throat. EXAM: ESOPHOGRAM / BARIUM SWALLOW / BARIUM TABLET STUDY TECHNIQUE: Combined double contrast and single contrast examination performed using effervescent crystals, thick barium liquid, and thin barium liquid. The patient was observed with fluoroscopy swallowing a 13 mm barium sulphate tablet. FLUOROSCOPY TIME:  Fluoroscopy Time:  1 minutes and 12 seconds. Radiation Exposure Index (if provided by the fluoroscopic device): 16.3 mGy COMPARISON:  None. FINDINGS: Frontal and lateral views of the hypopharynx while swallowing are normal. Double contrast imaging of the esophagus is normal. Esophageal motility is normal with good preservation of primary peristalsis. 13 mm barium tablet passes readily into the stomach when taken with  water. IMPRESSION: Normal double contrast barium esophagram. Electronically Signed   By: Misty Stanley M.D.   On: 06/02/2018 12:25    Assessment & Plan:   There are no diagnoses linked to this encounter.   No orders of the defined types were placed in this encounter.    Follow-up: No follow-ups on file.  [name], MD

## 2018-09-02 NOTE — Assessment & Plan Note (Signed)
Doing well 

## 2018-09-02 NOTE — Patient Instructions (Signed)
  Gluten free trial for 4-6 weeks. OK to use gluten-free bread and gluten-free pasta.    Gluten-Free Diet for Celiac Disease, Adult The gluten-free diet includes all foods that do not contain gluten. Gluten is a protein that is found in wheat, rye, barley, and some other grains. Following the gluten-free diet is the only treatment for people with celiac disease. It helps to prevent damage to the intestines and improves or eliminates the symptoms of celiac disease. Following the gluten-free diet requires some planning. It can be challenging at first, but it gets easier with time and practice. There are more gluten-free options available today than ever before. If you need help finding gluten-free foods or if you have questions, talk with your diet and nutrition specialist (registered dietitian) or your health care provider. What do I need to know about a gluten-free diet?  All fruits, vegetables, and meats are safe to eat and do not contain gluten.  When grocery shopping, start by shopping in the produce, meat, and dairy sections. These sections are more likely to contain gluten-free foods. Then move to the aisles that contain packaged foods if you need to.  Read all food labels. Gluten is often added to foods. Always check the ingredient list and look for warnings, such as "may contain gluten."  Talk with your dietitian or health care provider before taking a gluten-free multivitamin or mineral supplement.  Be aware of gluten-free foods having contact with foods that contain gluten (cross-contamination). This can happen at home and with any processed foods. ? Talk with your health care provider or dietitian about how to reduce the risk of cross-contamination in your home. ? If you have questions about how a food is processed, ask the manufacturer. What key words help to identify gluten? Foods that list any of these key words on the label usually contain gluten:  Wheat, flour, enriched  flour, bromated flour, white flour, durum flour, graham flour, phosphated flour, self-rising flour, semolina, farina, barley (malt), rye, and oats.  Starch, dextrin, modified food starch, or cereal.  Thickening, fillers, or emulsifiers.  Malt flavoring, malt extract, or malt syrup.  Hydrolyzed vegetable protein.  In the [place] packaged foods that are gluten-free are required to be labeled "GF." These foods should be easy to identify and are safe to eat. In the [place] food companies are also required to list common food allergens, including wheat, on their labels. Recommended foods Grains  Amaranth, bean flours, 100% buckwheat flour, corn, millet, nut flours or nut meals, GF oats, quinoa, rice, sorghum, teff, rice wafers, pure cornmeal tortillas, popcorn, and hot cereals made from cornmeal. Hominy, rice, wild rice. Some Asian rice noodles or bean noodles. Arrowroot starch, corn bran, corn flour, corn germ, cornmeal, corn starch, potato flour, potato starch flour, and rice bran. Plain, brown, and sweet rice flours. Rice polish, soy flour, and tapioca starch. Vegetables  All plain fresh, frozen, and canned vegetables. Fruits  All plain fresh, frozen, canned, and dried fruits, and 100% fruit juices. Meats and other protein foods  All fresh beef, pork, poultry, fish, seafood, and eggs. Fish canned in water, oil, brine, or vegetable broth. Plain nuts and seeds, peanut butter. Some lunch meat and some frankfurters. Dried beans, dried peas, and lentils. Dairy  Fresh plain, dry, evaporated, or condensed milk. Cream, butter, sour cream, whipping cream, and most yogurts. Unprocessed cheese, most processed cheeses, some cottage cheese, some cream cheeses. Beverages  Coffee, tea, most herbal teas. Carbonated beverages and some root beers.   Wine, sake, and distilled spirits, such as gin, vodka, and whiskey. Most hard ciders. Fats and oils  Butter, margarine, vegetable oil, hydrogenated butter, olive  oil, shortening, lard, cream, and some mayonnaise. Some commercial salad dressings. Olives. Sweets and desserts  Sugar, honey, some syrups, molasses, jelly, and jam. Plain hard candy, marshmallows, and gumdrops. Pure cocoa powder. Plain chocolate. Custard and some pudding mixes. Gelatin desserts, sorbets, frozen ice pops, and sherbet. Cake, cookies, and other desserts prepared with allowed flours. Some commercial ice creams. Cornstarch, tapioca, and rice puddings. Seasoning and other foods  Some canned or frozen soups. Monosodium glutamate (MSG). Cider, rice, and wine vinegar. Baking soda and baking powder. Cream of tartar. Baking and nutritional yeast. Certain soy sauces made without wheat (ask your dietitian about specific brands that are allowed). Nuts, coconut, and chocolate. Salt, pepper, herbs, spices, flavoring extracts, imitation or artificial flavorings, natural flavorings, and food colorings. Some medicines and supplements. Some lip glosses and other cosmetics. Rice syrups. The items listed may not be a complete list. Talk with your dietitian about what dietary choices are best for you. Foods to avoid Grains  Barley, bran, bulgur, couscous, cracked wheat, [place], farro, graham, malt, matzo, semolina, wheat germ, and all wheat and rye cereals including spelt and kamut. Cereals containing malt as a flavoring, such as rice cereal. Noodles, spaghetti, macaroni, most packaged rice mixes, and all mixes containing wheat, rye, barley, or triticale. Vegetables  Most creamed vegetables and most vegetables canned in sauces. Some commercially prepared vegetables and salads. Fruits  Thickened or prepared fruits and some pie fillings. Some fruit snacks and fruit roll-ups. Meats and other protein foods  Any meat or meat alternative containing wheat, rye, barley, or gluten stabilizers. These are often marinated or packaged meats and lunch meats. Bread-containing products, such as Swiss steak,  croquettes, meatballs, and meatloaf. Most tuna canned in vegetable broth and turkey with hydrolyzed vegetable protein (HVP) injected as part of the basting. Seitan. Imitation fish. Eggs in sauces made from ingredients to avoid. Dairy  Commercial chocolate milk drinks and malted milk. Some non-dairy creamers. Any cheese product containing ingredients to avoid. Beverages  Certain cereal beverages. Beer, ale, malted milk, and some root beers. Some hard ciders. Some instant flavored coffees. Some herbal teas made with barley or with barley malt added. Fats and oils  Some commercial salad dressings. Sour cream containing modified food starch. Sweets and desserts  Some toffees. Chocolate-coated nuts (may be rolled in wheat flour) and some commercial candies and candy bars. Most cakes, cookies, donuts, pastries, and other baked goods. Some commercial ice cream. Ice cream cones. Commercially prepared mixes for cakes, cookies, and other desserts. Bread pudding and other puddings thickened with flour. Products containing brown rice syrup made with barley malt enzyme. Desserts and sweets made with malt flavoring. Seasoning and other foods  Some curry powders, some dry seasoning mixes, some gravy extracts, some meat sauces, some ketchups, some prepared mustards, and horseradish. Certain soy sauces. Malt vinegar. Bouillon and bouillon cubes that contain HVP. Some chip dips, and some chewing gum. Yeast extract. Brewer's yeast. Caramel color. Some medicines and supplements. Some lip glosses and other cosmetics. The items listed may not be a complete list. Talk with your dietitian about what dietary choices are best for you. Summary  Gluten is a protein that is found in wheat, rye, barley, and some other grains. The gluten-free diet includes all foods that do not contain gluten.  If you need help finding gluten-free foods or if   you have questions, talk with your diet and nutrition specialist (registered  dietitian) or your health care provider.  Read all food labels. Gluten is often added to foods. Always check the ingredient list and look for warnings, such as "may contain gluten." This information is not intended to replace advice given to you by your health care provider. Make sure you discuss any questions you have with your health care provider. Document Released: 06/04/2005 Document Revised: 03/19/2016 Document Reviewed: 03/19/2016 Elsevier Interactive Patient Education  2018 Elsevier Inc.   

## 2018-09-02 NOTE — Assessment & Plan Note (Signed)
Resolved

## 2018-09-03 ENCOUNTER — Ambulatory Visit: Payer: Medicare Other | Admitting: Internal Medicine

## 2018-09-09 ENCOUNTER — Ambulatory Visit: Payer: Medicare Other | Admitting: Internal Medicine

## 2018-09-10 ENCOUNTER — Ambulatory Visit: Payer: Medicare Other | Admitting: Internal Medicine

## 2018-09-10 ENCOUNTER — Ambulatory Visit: Payer: Medicare Other | Admitting: Cardiology

## 2018-09-11 ENCOUNTER — Ambulatory Visit: Payer: Medicare Other | Admitting: Cardiovascular Disease

## 2018-09-16 ENCOUNTER — Ambulatory Visit: Payer: Medicare Other | Admitting: Family Medicine

## 2018-09-17 ENCOUNTER — Ambulatory Visit: Payer: Medicare Other | Admitting: Internal Medicine

## 2018-09-18 ENCOUNTER — Telehealth: Payer: Self-pay | Admitting: Cardiovascular Disease

## 2018-09-18 DIAGNOSIS — R002 Palpitations: Secondary | ICD-10-CM

## 2018-09-18 NOTE — Telephone Encounter (Signed)
° °  Patient requesting to speak with Shelly (monitor) she would like to provide additional information on extended use of monitor. Please call

## 2018-09-18 NOTE — Telephone Encounter (Signed)
Patient has contacted [employer] regarding checking with her insurance to see if they will cover another 30 day cardiac event monitor and when.  According to [employer], her insurance will cover another 30 day monitor after 31 days  From her original or 09/27/18.  I will need Dr. Angelena Form to order a 2nd cardiac event monitor with the dx: R00.2.  At that time, I will enroll [name] with [employer] for them to ship another cardiac event monitor directly to her home.  Patient also wanted to let Dr. Angelena Form know she will purchase the private Kardio recording device after her 2nd 30 day monitor.

## 2018-09-22 ENCOUNTER — Telehealth: Payer: Self-pay | Admitting: *Deleted

## 2018-09-22 NOTE — Telephone Encounter (Signed)
Called patient to inform her she has been enrolled for [employer] to ship a 2nd 30 day cardiac event monitor to her home.  She requested additional gel type non adhesive electrodes that did not come from [employer].  I will send a contact message to [employer] asking them to ship a sensitive skin electrode adapter and if possible non adhesive gel/fabric type electrodes.  Patient to leave message as to how many Biotel gel/ fabric electrodes she needs.  I can contact in office staff to see if we have any available in monitor room and mail them to the patient.

## 2018-09-22 NOTE — Telephone Encounter (Signed)
I will ask our triage team to help order this. Thanks, Gerald Stabs

## 2018-09-27 ENCOUNTER — Encounter (INDEPENDENT_AMBULATORY_CARE_PROVIDER_SITE_OTHER): Payer: Medicare Other

## 2018-09-27 ENCOUNTER — Encounter: Payer: Self-pay | Admitting: Cardiovascular Disease

## 2018-09-27 DIAGNOSIS — R002 Palpitations: Secondary | ICD-10-CM | POA: Diagnosis not present

## 2018-10-03 ENCOUNTER — Telehealth: Payer: Self-pay | Admitting: *Deleted

## 2018-10-03 ENCOUNTER — Telehealth: Payer: Self-pay | Admitting: Cardiovascular Disease

## 2018-10-03 NOTE — Telephone Encounter (Signed)
° °  VIDEO/Doximity visit on 10/06/2018   Consent confirmed via MyChart   -  10/03/2018

## 2018-10-03 NOTE — Telephone Encounter (Signed)
Reviewed with Dr. Angelena Form and called patient. Cancelled her appt for 10/06/18. She just started wearing her 2nd event monitor. This can be rescheduled for when that is complete. Plan reschedule for end of May.  Pt aware and in agreement. She ordered a Kardia monitor as well.

## 2018-10-06 ENCOUNTER — Telehealth: Payer: Medicare Other | Admitting: Cardiovascular Disease

## 2018-10-06 ENCOUNTER — Telehealth (INDEPENDENT_AMBULATORY_CARE_PROVIDER_SITE_OTHER): Payer: Medicare Other | Admitting: Cardiovascular Disease

## 2018-10-06 ENCOUNTER — Encounter: Payer: Self-pay | Admitting: Cardiovascular Disease

## 2018-10-06 ENCOUNTER — Other Ambulatory Visit: Payer: Self-pay

## 2018-10-06 ENCOUNTER — Telehealth: Payer: Self-pay | Admitting: Cardiovascular Disease

## 2018-10-06 VITALS — BP 104/70 | HR 78 | Temp 98.5°F | Ht 61.5 in | Wt 98.0 lb

## 2018-10-06 DIAGNOSIS — I471 Supraventricular tachycardia, unspecified: Secondary | ICD-10-CM

## 2018-10-06 DIAGNOSIS — I351 Nonrheumatic aortic (valve) insufficiency: Secondary | ICD-10-CM | POA: Diagnosis not present

## 2018-10-06 NOTE — Patient Instructions (Addendum)
Medication Instructions:  Your physician has recommended you make the following change in your medication:   Only take 1 tablet (50 mg) of your flecainide daily AS NEEDED for palpitaions   Lab work: None Ordered  If you have labs (blood work) drawn today and your tests are completely normal, you will receive your results only by: [name] MyChart Message (if you have MyChart) OR . A paper copy in the mail If you have any lab test that is abnormal or we need to change your treatment, we will call you to review the results.  Testing/Procedures: None ordered  Follow-Up: . Follow up with Dr. Angelena Form after your monitor via VIDEO Visit on 10/31/18 at 1:40 PM  Any Other Special Instructions Will Be Listed Below (If Applicable).

## 2018-10-06 NOTE — Telephone Encounter (Signed)
PT HAS A LOT OF QUESTIONS RE RECOMMENDATIONS WAS TOLD TO TAKE FLECAINIDE 50 MG 1 TAB AS NEEDED FOR PALPITATIONS BIT DIRECTIONS SAY 2 PILLS AND ALSO HAD EPISODE ON Friday OF FAST HEART RATE THAT LASTED 15 MIN PT IS WANTING VIRTUAL VISIT DISCUSSED WITH DR Angelena Form AND WILL DO VIRTUAL VISIT TODAY APPT MADE FOR 12:00 TODAY AND PT IS AWARE .Adonis Housekeeper

## 2018-10-06 NOTE — Progress Notes (Signed)
Virtual Visit via Video Note   This visit type was conducted due to national recommendations for restrictions regarding the COVID-19 Pandemic (e.g. social distancing) in an effort to limit this patient's exposure and mitigate transmission in our community.  Due to her co-morbid illnesses, this patient is at least at moderate risk for complications without adequate follow up.  This format is felt to be most appropriate for this patient at this time.  All issues noted in this document were discussed and addressed.  A limited physical exam was performed with this format.  Please refer to the patient's chart for her consent to telehealth for [facility].   Evaluation Performed:  Follow-up visit  Date:  10/06/2018   ID:  [name], DOB August 04, 1950, MRN [id number]  Patient Location: Home Provider Location: Office  PCP:  Cassandria Anger, MD  Cardiologist:  Lauree Chandler, MD  Electrophysiologist:  None   Chief Complaint:  Follow up-palpitations  History of Present Illness:    [name] is a 68 y.o. female with history of premature atrial contractions, palpitations, vasovagal syncope, mild aortic valve insufficiency, mild mitral regurgitation, irritable bowel syndrome and GERD who is being seen today by virtual e-visit due to the [place] pandemic. She has had palpitations for years but no documented arrhythmias. Echo March 2015 with with mild AI, mild MR, normal LV systolic function. Exercise stress test 05/07/14 with good exercise tolerance, no ischemic EKG changes and no chest pain with exercise. Echo February 2019 with normal LV function, mild AI. She was seen in our office 08/04/18 with c/o 3 episodes of feeling her heart racing. The first was while exercising at the gym. Her heart raced for 45 minutes. Echo 08/25/18 with LVEF=55-60%, mild AI, unchanged. 30 day event monitor March 2020 with sinus rhythm with PACs and PVCs. She wished to wear a second 30 day monitor  in April 2020 and is wearing this now. 3 days ago there was a run of SVT.   She tells me today that she has been feeling well but did feel the episode Friday. Her heart raced for 15 minutes. No associated symptoms. No chest pain or dyspnea. No near syncope or syncope.   The patient does not have symptoms concerning for COVID-19 infection (fever, chills, cough, or new shortness of breath).    Past Medical History:  Diagnosis Date   Allergic rhinitis, cause unspecified    Backache, unspecified    Benign fasciculation-cramp syndrome 12/03/2012    Worsening with  stress, fatigue .   Calculus of kidney    Conversion disorder    Cotton wool spots    Esophageal reflux    Floater, vitreous, left 05/2016   GERD (gastroesophageal reflux disease)    Hemorrhage of rectum and anus    Internal hemorrhoids without mention of complication    Irritable bowel syndrome    Microscopic hematuria    Migraine headache with aura    Mild aortic insufficiency    Mild tricuspid regurgitation    Osteopenia    Other plastic surgery for unacceptable cosmetic appearance    Inj tx fllers/expander   Personal history of urinary calculi    Postmenopausal atrophic vaginitis    Premature atrial contractions    PVC (premature ventricular contraction)    a. Event monitor 2013.   Unspecified constipation    Vasovagal syncope    Past Surgical History:  Procedure Laterality Date   BREAST BIOPSY Right    Cosmetic Procedures w/Injection therapy  SKIN CANCER EXCISION Left    thigh   WISDOM TOOTH EXTRACTION       Current Meds  Medication Sig   ALPRAZolam (XANAX) 0.25 MG tablet 0.5-1 po bid prn anxiety   ALREX 0.2 % SUSP Apply 0.2 drops to eye as needed (allergies).    Botulinum Toxin Type A, Cosm, (BOTOX COSMETIC) 50 UNITS SOLR Inject 4-6 Units into the muscle. 3 month  injection   conjugated estrogens (PREMARIN) vaginal cream Use PV once every 2-3 days   Cyanocobalamin  (VITAMIN B-12) 1000 MCG SUBL Place 1 tablet (1,000 mcg total) under the tongue every other day.   dexlansoprazole (DEXILANT) 60 MG capsule Take 1 capsule (60 mg total) by mouth daily.   EPIPEN 2-PAK 0.3 MG/0.3ML SOAJ injection See admin instructions. Reported on 10/08/2015   flecainide (TAMBOCOR) 50 MG tablet Take 50 mg by mouth as needed (take 1 tablet as needed for fast heart rate).   fluocinonide cream (LIDEX) 2.58 % Apply 1 application topically 2 (two) times daily.   hyoscyamine (LEVSIN SL) 0.125 MG SL tablet Place 1 tablet (0.125 mg total) under the tongue every 4 (four) hours as needed (for throat tightness).   loratadine (CLARITIN) 10 MG tablet Take 1 tablet (10 mg total) by mouth daily.   mometasone (NASONEX) 50 MCG/ACT nasal spray Place 2 sprays into the nose daily.   tretinoin (RETIN-A) 0.05 % cream Apply 1 application topically at bedtime.   triamcinolone ointment (KENALOG) 0.5 % Apply 1 application topically 2 (two) times daily.   [DISCONTINUED] flecainide (TAMBOCOR) 50 MG tablet Take 2 tablets (100 mg total) by mouth as needed (Fast heart beat).     Allergies:   Codeine; Doxycycline; Epinephrine; Neomycin; Penicillins; Protonix [pantoprazole sodium]; Sulfonamide derivatives; and Zithromax [azithromycin]   Social History   Tobacco Use   Smoking status: Never Smoker   Smokeless tobacco: Never Used  Substance Use Topics   Alcohol use: No   Drug use: No     Family Hx: The patient's family history includes COPD in her father; Hyperlipidemia in her father and mother; Hypertension in her father and mother. There is no history of Diabetes, Colon cancer, Breast cancer, Coronary artery disease, Esophageal cancer, Stomach cancer, Pancreatic cancer, or Liver disease.  ROS:   Please see the history of present illness.     All other systems reviewed and are negative.   Prior CV studies:   The following studies were reviewed today:  Echo 08/25/18:  1. The left  ventricle has normal systolic function, with an ejection fraction of 55-60%. The cavity size was normal. Left ventricular diastolic parameters were normal.  2. The right ventricle has normal systolic function. The cavity was normal. There is no increase in right ventricular wall thickness.  3. The tricuspid valve is grossly normal.  4. The aortic valve is tricuspid Mild thickening of the aortic valve Aortic valve regurgitation is mild by color flow Doppler.  5. Normal LV function; sclerotic aortic valve with mild AI.  Labs/Other Tests and Data Reviewed:    EKG:  No ECG reviewed.  Recent Labs: 03/03/2018: ALT 13; Hemoglobin 13.1; Platelets 215.0 08/04/2018: BUN 22; Creatinine, Ser 0.87; Magnesium 2.1; Potassium 4.4; Sodium 145; TSH 1.770   Recent Lipid Panel Lab Results  Component Value Date/Time   CHOL 204 (H) 03/03/2018 04:32 PM   TRIG 197.0 (H) 03/03/2018 04:32 PM   TRIG 66 06/25/2006 07:41 AM   HDL 66.70 03/03/2018 04:32 PM   CHOLHDL 3 03/03/2018 04:32 PM  [place] 98 03/03/2018 04:32 PM    Wt Readings from Last 3 Encounters:  10/06/18 98 lb (44.5 kg)  09/02/18 102 lb (46.3 kg)  08/28/18 100 lb (45.4 kg)     Objective:    Vital Signs:  BP 104/70    Pulse 78    Temp 98.5 F (36.9 C)    Ht 5' 1.5" (1.562 m)    Wt 98 lb (44.5 kg)    SpO2 97%    BMI 18.22 kg/m    VITAL SIGNS:  reviewed GEN:  no acute distress  ASSESSMENT & PLAN:    1. SVT/Palpitations: She is known to have PACs/PVCs in the past. Current monitor shows SVT. She currently has flecainide to use as needed for rapid heart rates. I think this is reasonable given the current need to avoid the ED given the [place] pandemic. It may be reasonable to even consider short acting Diltiazem if she has more occurrences. I reviewed this with our EP team this am. I have reviewed vagal maneuvers with the patient as well. She will finish wearing her monitor and I will see her back either in the office by virtual e-visit in 4 weeks.   She knows to go to the ED if prolonged episodes of SVT and symptoms. She would be a good candidate for ablation if she has multiple recurrences.   2. Aortic valve insufficiency: Mild by echo March 2020. No change in last ten years.   3. Carotid artery disease: Mild disease by u/s August 201. Repeat dopplers August 2020.    COVID-19 Education: The signs and symptoms of COVID-19 were discussed with the patient and how to seek care for testing (follow up with PCP or arrange E-visit).  The importance of social distancing was discussed today.  Time:   Today, I have spent 15 minutes with the patient with telehealth technology discussing the above problems.     Medication Adjustments/Labs and Tests Ordered: Current medicines are reviewed at length with the patient today.  Concerns regarding medicines are outlined above.   Tests Ordered: No orders of the defined types were placed in this encounter.   Medication Changes: No orders of the defined types were placed in this encounter.   Disposition:  Follow up in 4 week(s)  Signed, Lauree Chandler, MD  10/06/2018 12:19 PM    [facility]

## 2018-10-06 NOTE — Telephone Encounter (Signed)
  Patient is calling because she had an episode on Friday 10/03/18. She is wearing a holter monitor and she would like to speak with the nurse. She also has some medication questions.

## 2018-10-06 NOTE — Telephone Encounter (Signed)
Spoke with patient who agrees to VIDEO Visit with DOXIMITY with Dr. Angelena Form today at 12:00.      Virtual Visit Pre-Appointment Phone Call   TELEPHONE CALL NOTE  [name] has been deemed a candidate for a follow-up tele-health visit to limit community exposure during the Covid-19 pandemic. I spoke with the patient via phone to ensure availability of phone/video source, confirm preferred email & phone number, and discuss instructions and expectations.  I reminded [name] to be prepared with any vital sign and/or heart rhythm information that could potentially be obtained via home monitoring, at the time of her visit. I reminded [name] to expect a phone call at the time of her visit if her visit.  Patient agrees to consent below.  [name] Gustin, RN 10/06/2018 9:16 AM       FULL LENGTH CONSENT FOR TELE-HEALTH VISIT   I hereby voluntarily request, consent and authorize [facility] and its employed or contracted physicians, physician assistants, nurse practitioners or other licensed health care professionals (the Practitioner), to provide me with telemedicine health care services (the "Services") as deemed necessary by the treating Practitioner. I acknowledge and consent to receive the Services by the Practitioner via telemedicine. I understand that the telemedicine visit will involve communicating with the Practitioner through live audiovisual communication technology and the disclosure of certain medical information by electronic transmission. I acknowledge that I have been given the opportunity to request an in-person assessment or other available alternative prior to the telemedicine visit and am voluntarily participating in the telemedicine visit.  I understand that I have the right to withhold or withdraw my consent to the use of telemedicine in the course of my care at any time, without affecting my right to future care or treatment, and that the  Practitioner or I may terminate the telemedicine visit at any time. I understand that I have the right to inspect all information obtained and/or recorded in the course of the telemedicine visit and may receive copies of available information for a reasonable fee.  I understand that some of the potential risks of receiving the Services via telemedicine include:  [name] Delay or interruption in medical evaluation due to technological equipment failure or disruption; . Information transmitted may not be sufficient (e.g. poor resolution of images) to allow for appropriate medical decision making by the Practitioner; and/or  . In rare instances, security protocols could fail, causing a breach of personal health information.  Furthermore, I acknowledge that it is my responsibility to provide information about my medical history, conditions and care that is complete and accurate to the best of my ability. I acknowledge that Practitioner's advice, recommendations, and/or decision may be based on factors not within their control, such as incomplete or inaccurate data provided by me or distortions of diagnostic images or specimens that may result from electronic transmissions. I understand that the practice of medicine is not an exact science and that Practitioner makes no warranties or guarantees regarding treatment outcomes. I acknowledge that I will receive a copy of this consent concurrently upon execution via email to the email address I last provided but may also request a printed copy by calling the office of [facility].    I understand that my insurance will be billed for this visit.   I have read or had this consent read to me. . I understand the contents of this consent, which adequately explains the benefits and risks of the Services being provided via telemedicine.  [name]  I have been provided ample opportunity to ask questions regarding this consent and the Services and have had my questions answered to my  satisfaction. . I give my informed consent for the services to be provided through the use of telemedicine in my medical care  By participating in this telemedicine visit I agree to the above.

## 2018-10-24 ENCOUNTER — Telehealth: Payer: Self-pay | Admitting: *Deleted

## 2018-10-24 NOTE — Telephone Encounter (Signed)
VIDEO-Doximity-May [id number] Consent sent through my chart.   Virtual Visit Pre-Appointment Phone Call  "(Name), I am calling you today to discuss your upcoming appointment. We are currently trying to limit exposure to the virus that causes COVID-19 by seeing patients at home rather than in the office."  "What is the BEST phone number to call the day of the visit?" - [phone]  1. "Do you have or have access to (through a family member/friend) a smartphone with video capability that we can use for your visit?" yes a. If yes - list this number in appt notes as "cell" (if different from BEST phone #) and list the appointment type as a VIDEO visit in appointment notes b. If no - list the appointment type as a PHONE visit in appointment notes  2. Confirm consent - "In the setting of the current Covid19 crisis, you are scheduled for a video visit with your provider on May 15,2020 at 1:30 Just as we do with many in-office visits, in order for you to participate in this visit, we must obtain consent.  If you'd like, I can send this to your mychart (if signed up) or email for you to review.  Otherwise, I can obtain your verbal consent now.  All virtual visits are billed to your insurance company just like a normal visit would be.  By agreeing to a virtual visit, we'd like you to understand that the technology does not allow for your provider to perform an examination, and thus may limit your provider's ability to fully assess your condition. If your provider identifies any concerns that need to be evaluated in person, we will make arrangements to do so.  Finally, though the technology is pretty good, we cannot assure that it will always work on either your or our end, and in the setting of a video visit, we may have to convert it to a phone-only visit.  In either situation, we cannot ensure that we have a secure connection.  Are you willing to proceed?" STAFF: Did the patient verbally acknowledge consent  to telehealth visit? Document YES/NO here: Consent sent to pt through my chart.   3. Advise patient to be prepared - "Two hours prior to your appointment, go ahead and check your blood pressure, pulse, oxygen saturation, and your weight (if you have the equipment to check those) and write them all down. When your visit starts, your provider will ask you for this information. If you have an Apple Watch or Kardia device, please plan to have heart rate information ready on the day of your appointment. Please have a pen and paper handy nearby the day of the visit as well."  4. Give patient instructions for MyChart download to smartphone OR Doximity/Doxy.me as below if video visit (depending on what platform provider is using)  5. Inform patient they will receive a phone call 15 minutes prior to their appointment time (may be from unknown caller ID) so they should be prepared to answer    TELEPHONE CALL NOTE  Ciena Sampley has been deemed a candidate for a follow-up tele-health visit to limit community exposure during the Covid-19 pandemic. I spoke with the patient via phone to ensure availability of phone/video source, confirm preferred email & phone number, and discuss instructions and expectations.  I reminded Ming Kunka to be prepared with any vital sign and/or heart rhythm information that could potentially be obtained via home monitoring, at the time of her visit. I reminded Vielka Klinedinst to  expect a phone call prior to her visit.  Leodis Liverpool, RN 10/24/2018 11:15 AM   INSTRUCTIONS FOR DOWNLOADING THE MYCHART APP TO SMARTPHONE  - The patient must first make sure to have activated MyChart and know their login information - If Apple, go to [employer] and type in MyChart in the search bar and download the app. If Android, ask patient to go to [employer] and type in [facility] in the search bar and download the app. The app is free but as with any other app downloads, their  phone may require them to verify saved payment information or Apple/Android password.  - The patient will need to then log into the app with their MyChart username and password, and select [facility] as their healthcare provider to link the account. When it is time for your visit, go to the MyChart app, find appointments, and click Begin Video Visit. Be sure to Select Allow for your device to access the Microphone and Camera for your visit. You will then be connected, and your provider will be with you shortly.  **If they have any issues connecting, or need assistance please contact MyChart service desk [phone])**  **If using a computer, in order to ensure the best quality for their visit they will need to use either of the following Internet Browsers: [employer], or Google Chrome**  IF USING DOXIMITY or DOXY.ME - The patient will receive a link just prior to their visit by text.     FULL LENGTH CONSENT FOR TELE-HEALTH VISIT   I hereby voluntarily request, consent and authorize [facility] and its employed or contracted physicians, physician assistants, nurse practitioners or other licensed health care professionals (the Practitioner), to provide me with telemedicine health care services (the "Services") as deemed necessary by the treating Practitioner. I acknowledge and consent to receive the Services by the Practitioner via telemedicine. I understand that the telemedicine visit will involve communicating with the Practitioner through live audiovisual communication technology and the disclosure of certain medical information by electronic transmission. I acknowledge that I have been given the opportunity to request an in-person assessment or other available alternative prior to the telemedicine visit and am voluntarily participating in the telemedicine visit.  I understand that I have the right to withhold or withdraw my consent to the use of telemedicine in the course of  my care at any time, without affecting my right to future care or treatment, and that the Practitioner or I may terminate the telemedicine visit at any time. I understand that I have the right to inspect all information obtained and/or recorded in the course of the telemedicine visit and may receive copies of available information for a reasonable fee.  I understand that some of the potential risks of receiving the Services via telemedicine include:  [name] Delay or interruption in medical evaluation due to technological equipment failure or disruption; . Information transmitted may not be sufficient (e.g. poor resolution of images) to allow for appropriate medical decision making by the Practitioner; and/or  . In rare instances, security protocols could fail, causing a breach of personal health information.  Furthermore, I acknowledge that it is my responsibility to provide information about my medical history, conditions and care that is complete and accurate to the best of my ability. I acknowledge that Practitioner's advice, recommendations, and/or decision may be based on factors not within their control, such as incomplete or inaccurate data provided by me or distortions of diagnostic images or specimens that  may result from electronic transmissions. I understand that the practice of medicine is not an exact science and that Practitioner makes no warranties or guarantees regarding treatment outcomes. I acknowledge that I will receive a copy of this consent concurrently upon execution via email to the email address I last provided but may also request a printed copy by calling the office of [facility].    I understand that my insurance will be billed for this visit.   I have read or had this consent read to me. . I understand the contents of this consent, which adequately explains the benefits and risks of the Services being provided via telemedicine.  . I have been provided ample opportunity to ask  questions regarding this consent and the Services and have had my questions answered to my satisfaction. . I give my informed consent for the services to be provided through the use of telemedicine in my medical care  By participating in this telemedicine visit I agree to the above.

## 2018-10-27 ENCOUNTER — Other Ambulatory Visit: Payer: Self-pay

## 2018-10-31 ENCOUNTER — Telehealth (INDEPENDENT_AMBULATORY_CARE_PROVIDER_SITE_OTHER): Payer: Medicare Other | Admitting: Cardiovascular Disease

## 2018-10-31 ENCOUNTER — Encounter: Payer: Self-pay | Admitting: Cardiovascular Disease

## 2018-10-31 ENCOUNTER — Other Ambulatory Visit: Payer: Self-pay

## 2018-10-31 VITALS — BP 112/57 | HR 63 | Temp 98.1°F | Ht 61.5 in | Wt 100.0 lb

## 2018-10-31 DIAGNOSIS — I471 Supraventricular tachycardia: Secondary | ICD-10-CM

## 2018-10-31 NOTE — Progress Notes (Signed)
Virtual Visit via Video Note   This visit type was conducted due to national recommendations for restrictions regarding the COVID-19 Pandemic (e.g. social distancing) in an effort to limit this patient's exposure and mitigate transmission in our community.  Due to her co-morbid illnesses, this patient is at least at moderate risk for complications without adequate follow up.  This format is felt to be most appropriate for this patient at this time.  All issues noted in this document were discussed and addressed.  A limited physical exam was performed with this format.  Please refer to the patient's chart for her consent to telehealth for [facility].   Date:  10/31/2018   ID:  [name], DOB 1951/05/16, MRN [id number]  Patient Location: Home Provider Location: Home  PCP:  Cassandria Anger, MD  Cardiologist:  Lauree Chandler, MD  Electrophysiologist:  None   Evaluation Performed:  Follow-Up Visit  Chief Complaint:  Follow up- SVT  History of Present Illness:    [name] is a 68 y.o. female with history of SVT, premature atrial contractions, palpitations, vasovagal syncope, mild aortic valve insufficiency, mild mitral regurgitation, irritable bowel syndrome and GERD who is being seen today by virtual e-visit due to the [place] pandemic. She has had palpitations for years but no documented arrhythmias. Most recent cardiac monitor April 2020 showed a short run of SVT. Exercise stress test 05/07/14 with good exercise tolerance, no ischemic Echo 08/25/18 with LVEF=55-60%, mild AI, unchanged. 30 day event monitor March 2020 with sinus rhythm with PACs and PVCs. She wished to wear a second 30 day monitor in April 2020 and is this showed an episode of SVT.   She has no chest pain, dyspnea, dizziness or lower extremity edema. No further palpitations.   The patient does not have symptoms concerning for COVID-19 infection (fever, chills, cough, or new shortness of  breath).    Past Medical History:  Diagnosis Date  . Allergic rhinitis, cause unspecified   . Backache, unspecified   . Benign fasciculation-cramp syndrome 12/03/2012    Worsening with  stress, fatigue .  [name] Calculus of kidney   . Conversion disorder   . Cotton wool spots   . Esophageal reflux   . Floater, vitreous, left 05/2016  . GERD (gastroesophageal reflux disease)   . Hemorrhage of rectum and anus   . Internal hemorrhoids without mention of complication   . Irritable bowel syndrome   . Microscopic hematuria   . Migraine headache with aura   . Mild aortic insufficiency   . Mild tricuspid regurgitation   . Osteopenia   . Other plastic surgery for unacceptable cosmetic appearance    Inj tx fllers/expander  . Personal history of urinary calculi   . Postmenopausal atrophic vaginitis   . Premature atrial contractions   . PVC (premature ventricular contraction)    a. Event monitor 2013.  [name] Unspecified constipation   . Vasovagal syncope    Past Surgical History:  Procedure Laterality Date  . BREAST BIOPSY Right   . Cosmetic Procedures w/Injection therapy    . SKIN CANCER EXCISION Left    thigh  . WISDOM TOOTH EXTRACTION       Current Meds  Medication Sig  . ALPRAZolam (XANAX) 0.25 MG tablet 0.5-1 po bid prn anxiety  . Botulinum Toxin Type A, Cosm, (BOTOX COSMETIC) 50 UNITS SOLR Inject 4-6 Units into the muscle. 3 month  injection  . conjugated estrogens (PREMARIN) vaginal cream Use PV once every  2-3 days  . Cyanocobalamin (VITAMIN B-12) 1000 MCG SUBL Place 1 tablet (1,000 mcg total) under the tongue every other day.  [name] dexlansoprazole (DEXILANT) 60 MG capsule Take 1 capsule (60 mg total) by mouth daily.  [name] EPIPEN 2-PAK 0.3 MG/0.3ML SOAJ injection See admin instructions. Reported on 10/08/2015  . flecainide (TAMBOCOR) 50 MG tablet Take 50 mg by mouth as needed (take 1 tablet as needed for fast heart rate).  . fluocinonide cream (LIDEX) 8.46 % Apply 1 application topically  2 (two) times daily.  . hyoscyamine (LEVSIN SL) 0.125 MG SL tablet Place 1 tablet (0.125 mg total) under the tongue every 4 (four) hours as needed (for throat tightness).  [name] loratadine (CLARITIN) 10 MG tablet Take 1 tablet (10 mg total) by mouth daily.  . mometasone (NASONEX) 50 MCG/ACT nasal spray Place 2 sprays into the nose daily.  [name] tretinoin (RETIN-A) 0.05 % cream Apply 1 application topically at bedtime.  . triamcinolone ointment (KENALOG) 0.5 % Apply 1 application topically 2 (two) times daily.     Allergies:   Codeine; Doxycycline; Epinephrine; Neomycin; Penicillins; Protonix [pantoprazole sodium]; Sulfonamide derivatives; and Zithromax [azithromycin]   Social History   Tobacco Use  . Smoking status: Never Smoker  . Smokeless tobacco: Never Used  Substance Use Topics  . Alcohol use: No  . Drug use: No     Family Hx: The patient's family history includes COPD in her father; Hyperlipidemia in her father and mother; Hypertension in her father and mother. There is no history of Diabetes, Colon cancer, Breast cancer, Coronary artery disease, Esophageal cancer, Stomach cancer, Pancreatic cancer, or Liver disease.  ROS:   Please see the history of present illness.    All other systems reviewed and are negative.   Prior CV studies:   The following studies were reviewed today:  Echo March 2020:  1. The left ventricle has normal systolic function, with an ejection fraction of 55-60%. The cavity size was normal. Left ventricular diastolic parameters were normal.  2. The right ventricle has normal systolic function. The cavity was normal. There is no increase in right ventricular wall thickness.  3. The tricuspid valve is grossly normal.  4. The aortic valve is tricuspid Mild thickening of the aortic valve Aortic valve regurgitation is mild by color flow Doppler.  5. Normal LV function; sclerotic aortic valve with mild AI.  Labs/Other Tests and Data Reviewed:    EKG:  No ECG  reviewed.  Recent Labs: 03/03/2018: ALT 13; Hemoglobin 13.1; Platelets 215.0 08/04/2018: BUN 22; Creatinine, Ser 0.87; Magnesium 2.1; Potassium 4.4; Sodium 145; TSH 1.770   Recent Lipid Panel Lab Results  Component Value Date/Time   CHOL 204 (H) 03/03/2018 04:32 PM   TRIG 197.0 (H) 03/03/2018 04:32 PM   TRIG 66 06/25/2006 07:41 AM   HDL 66.70 03/03/2018 04:32 PM   CHOLHDL 3 03/03/2018 04:32 PM   LDLCALC 98 03/03/2018 04:32 PM    Wt Readings from Last 3 Encounters:  10/31/18 100 lb (45.4 kg)  10/06/18 98 lb (44.5 kg)  09/02/18 102 lb (46.3 kg)     Objective:    Vital Signs:  BP (!) 112/57   Pulse 63   Temp 98.1 F (36.7 C)   Ht 5' 1.5" (1.562 m)   Wt 100 lb (45.4 kg)   SpO2 97%   BMI 18.59 kg/m    VITAL SIGNS:  reviewed GEN:  no acute distress  ASSESSMENT & PLAN:    1. SVT/Palpitations: She is known  to have PACs/PVCs in the past and has been found to have SVT on recent monitor. Rare palpitations. She has flecainide to use as needed. I have again reviewed vagal maneuvers with the patient. She knows to go to the ED if prolonged episodes of SVT and symptoms. She would be a good candidate for ablation if she has multiple recurrences.   2. Aortic valve insufficiency:Mild by echo March 2020. No change in last ten years.   3. Carotid artery disease:Mild disease by u/s August 2019. Repeat dopplers August 2021.  COVID-19 Education: The signs and symptoms of COVID-19 were discussed with the patient and how to seek care for testing (follow up with PCP or arrange E-visit).  The importance of social distancing was discussed today.  Time:   Today, I have spent 15 minutes with the patient with telehealth technology discussing the above problems.     Medication Adjustments/Labs and Tests Ordered: Current medicines are reviewed at length with the patient today.  Concerns regarding medicines are outlined above.   Tests Ordered: No orders of the defined types were placed in  this encounter.   Medication Changes: No orders of the defined types were placed in this encounter.   Disposition:  Follow up in 6 month(s)  Signed, Lauree Chandler, MD  10/31/2018 1:50 PM    [facility]

## 2018-10-31 NOTE — Patient Instructions (Signed)

## 2018-11-12 ENCOUNTER — Encounter: Payer: Medicare Other | Admitting: Internal Medicine

## 2018-12-01 DIAGNOSIS — J3089 Other allergic rhinitis: Secondary | ICD-10-CM | POA: Diagnosis not present

## 2018-12-01 DIAGNOSIS — J301 Allergic rhinitis due to pollen: Secondary | ICD-10-CM | POA: Diagnosis not present

## 2018-12-03 DIAGNOSIS — J301 Allergic rhinitis due to pollen: Secondary | ICD-10-CM | POA: Diagnosis not present

## 2018-12-03 DIAGNOSIS — J3089 Other allergic rhinitis: Secondary | ICD-10-CM | POA: Diagnosis not present

## 2018-12-08 DIAGNOSIS — J301 Allergic rhinitis due to pollen: Secondary | ICD-10-CM | POA: Diagnosis not present

## 2018-12-08 DIAGNOSIS — J3089 Other allergic rhinitis: Secondary | ICD-10-CM | POA: Diagnosis not present

## 2018-12-09 DIAGNOSIS — L720 Epidermal cyst: Secondary | ICD-10-CM | POA: Diagnosis not present

## 2018-12-09 DIAGNOSIS — Z86018 Personal history of other benign neoplasm: Secondary | ICD-10-CM | POA: Diagnosis not present

## 2018-12-09 DIAGNOSIS — L821 Other seborrheic keratosis: Secondary | ICD-10-CM | POA: Diagnosis not present

## 2018-12-09 DIAGNOSIS — Z85828 Personal history of other malignant neoplasm of skin: Secondary | ICD-10-CM | POA: Diagnosis not present

## 2018-12-09 DIAGNOSIS — L859 Epidermal thickening, unspecified: Secondary | ICD-10-CM | POA: Diagnosis not present

## 2018-12-09 DIAGNOSIS — D225 Melanocytic nevi of trunk: Secondary | ICD-10-CM | POA: Diagnosis not present

## 2018-12-09 DIAGNOSIS — Z808 Family history of malignant neoplasm of other organs or systems: Secondary | ICD-10-CM | POA: Diagnosis not present

## 2018-12-09 DIAGNOSIS — D2230 Melanocytic nevi of unspecified part of face: Secondary | ICD-10-CM | POA: Diagnosis not present

## 2018-12-10 DIAGNOSIS — J301 Allergic rhinitis due to pollen: Secondary | ICD-10-CM | POA: Diagnosis not present

## 2018-12-10 DIAGNOSIS — J3089 Other allergic rhinitis: Secondary | ICD-10-CM | POA: Diagnosis not present

## 2018-12-12 ENCOUNTER — Encounter: Payer: Self-pay | Admitting: Family

## 2018-12-12 ENCOUNTER — Ambulatory Visit (INDEPENDENT_AMBULATORY_CARE_PROVIDER_SITE_OTHER): Payer: Medicare Other | Admitting: Family

## 2018-12-12 ENCOUNTER — Ambulatory Visit: Payer: Self-pay | Admitting: *Deleted

## 2018-12-12 DIAGNOSIS — N898 Other specified noninflammatory disorders of vagina: Secondary | ICD-10-CM

## 2018-12-12 MED ORDER — FLUCONAZOLE 150 MG PO TABS: 150.0000 mg | ORAL_TABLET | Freq: Once | ORAL | 0 refills | Status: AC

## 2018-12-12 NOTE — Telephone Encounter (Signed)
Patient called to say that she has itching and burning in her vaginal area not sure if it is from the conjugated estrogens (PREMARIN) vaginal cream. Ph# [phone]   Returned call to patient regarding above message. Used it twice this week. And then started itching for about 3 days. No discharge or smell today. Did have a discharge which she thought was from using the premarin vaginal cream.  She has been using the vaginal cream for over a year. She denies fever or pain or fever. Advised of having a virtual appointment, pt voiced understanding. [facility] notified for an appointment. Call transferred to the office. Routing to practice for review.  Reason for Disposition . [1] Symptoms of a yeast infection (i.e., itchy, white discharge, not bad smelling) AND    [2] not improved > 3 days following CARE ADVICE  Answer Assessment - Initial Assessment Questions 1. SYMPTOM: "What's the main symptom you're concerned about?" (e.g., pain, itching, dryness)     Itching  2. LOCATION: "Where is the  itching located?" (e.g., inside/outside, left/right)     Inside and outside 3. ONSET: "When did the  itching start?"     3 days ago 4. PAIN: "Is there any pain?" If so, ask: "How bad is it?" (Scale: 1-10; mild, moderate, severe)     no 5. ITCHING: "Is there any itching?" If so, ask: "How bad is it?" (Scale: 1-10; mild, moderate, severe)     Mild to moderate 6. CAUSE: "What do you think is causing the discharge?" "Have you had the same problem before? What happened then?"     Not sure , think it may be a yeast infection 7. OTHER SYMPTOMS: "Do you have any other symptoms?" (e.g., fever, itching, vaginal bleeding, pain with urination, injury to genital area, vaginal foreign body)     no 8. PREGNANCY: "Is there any chance you are pregnant?" "When was your last menstrual period?"     no  Protocols used: VAGINAL [facility]

## 2018-12-12 NOTE — Progress Notes (Signed)
[name] is a 68 y.o. female with the following history as recorded in [employer]:  Patient Active Problem List   Diagnosis Date Noted  . Sore throat 06/13/2018  . Friction injury to skin 04/14/2018  . Occipital neuralgia 03/03/2018  . B12 deficiency 03/03/2018  . Ocular migraine 02/05/2018  . Leg wound, right 12/26/2017  . Bilateral impacted cerumen 10/21/2017  . LLQ abdominal pain 09/23/2017  . Elevated antinuclear antibody (ANA) level 07/26/2017  . Arthralgia 07/26/2017  . Degenerative disc disease, lumbar 07/04/2017  . Cough 06/06/2017  . Chest discomfort 03/21/2017  . Neck mass 01/17/2017  . Mass of neck 12/25/2016  . Snoring 12/18/2016  . Medial tibial stress syndrome, left, initial encounter 08/14/2016  . Patellofemoral syndrome of both knees 08/09/2016  . Drug allergy, antibiotic 05/31/2016  . Hyperkalemia 02/17/2016  . Vertical diplopia 02/15/2016  . Tension headache 12/28/2015  . BPPV (benign paroxysmal positional vertigo) 10/08/2015  . TMJ click 37/85/8850  . Cold sore 07/18/2015  . Osteopenia 07/18/2015  . Earache on left 07/18/2015  . Cervical disc disorder with radiculopathy of cervical region 06/17/2015  . Cotton wool spots 03/15/2015  . Left LBP 03/08/2015  . Food poisoning 03/08/2015  . Generalized anxiety disorder 03/08/2015  . Dysuria 02/23/2015  . Well adult exam 09/09/2014  . Internal hemorrhoids 09/07/2014  . Allergic urticaria 08/20/2014  . Acute sinusitis 08/18/2014  . Neck muscle spasm 07/30/2014  . Grief 06/17/2014  . Mild aortic insufficiency   . Mild tricuspid regurgitation   . Vasovagal syncope   . Premature atrial contractions   . GERD (gastroesophageal reflux disease)   . PVC (premature ventricular contraction)   . Dense breast tissue 03/23/2014  . Microhematuria 03/22/2014  . Changing skin lesion 02/05/2014  . Rash and nonspecific skin eruption 12/22/2013  . Chronic meniscal tear of knee 12/21/2013  . Benign fasciculations  12/11/2013  . Strain of adductor magnus muscle of left lower extremity 05/21/2013  . Benign fasciculation-cramp syndrome 12/03/2012  . Vasovagal near syncope 11/02/2012  . Migraine with aura 11/02/2012  . Upper airway cough syndrome 08/16/2012  . Chest tightness 09/21/2011  . Bunion of great toe 09/11/2011  . Routine health maintenance 09/11/2011  . Mild mitral regurgitation by prior echocardiogram 05/15/2011  . Chronic neck pain 03/07/2011  . Skin lesion 03/07/2011  . DYSPLASTIC NEVUS, [place] 02/06/2010  . HAND PAIN, BILATERAL 08/25/2009  . Aortic valve disorder 08/02/2009  . Abnormal involuntary movements(781.0) 07/28/2009  . LUMBAR SPRAIN AND STRAIN 05/21/2009  . Palpitations 02/03/2009  . HEMORRHOIDS-INTERNAL 12/15/2008  . Constipation 12/15/2008  . Irritable bowel syndrome 12/15/2008  . RECTAL BLEEDING 12/15/2008  . RENAL CALCULUS, HX OF 11/29/2007  . Herpes simplex without mention of complication 27/74/1287  . Vaginal atrophy 03/25/2007  . GLOBUS HYSTERICUS 01/01/2007  . RHINITIS, ALLERGIC NOS 01/01/2007    Current Outpatient Medications  Medication Sig Dispense Refill  . ALPRAZolam (XANAX) 0.25 MG tablet 0.5-1 po bid prn anxiety 30 tablet 1  . Botulinum Toxin Type A, Cosm, (BOTOX COSMETIC) 50 UNITS SOLR Inject 4-6 Units into the muscle. 3 month  injection    . conjugated estrogens (PREMARIN) vaginal cream Use PV once every 2-3 days 90 g 2  . Cyanocobalamin (VITAMIN B-12) 1000 MCG SUBL Place 1 tablet (1,000 mcg total) under the tongue every other day. 100 tablet 3  . dexlansoprazole (DEXILANT) 60 MG capsule Take 1 capsule (60 mg total) by mouth daily. 30 capsule 11  . EPIPEN 2-PAK 0.3 MG/0.3ML SOAJ injection See  admin instructions. Reported on 10/08/2015  1  . flecainide (TAMBOCOR) 50 MG tablet Take 50 mg by mouth as needed (take 1 tablet as needed for fast heart rate).    . fluconazole (DIFLUCAN) 150 MG tablet Take 1 tablet (150 mg total) by mouth once for 1 dose. Repeat  after 72 hous 2 tablet 0  . fluocinonide cream (LIDEX) 3.71 % Apply 1 application topically 2 (two) times daily. 90 g 2  . hyoscyamine (LEVSIN SL) 0.125 MG SL tablet Place 1 tablet (0.125 mg total) under the tongue every 4 (four) hours as needed (for throat tightness). 30 tablet 3  . loratadine (CLARITIN) 10 MG tablet Take 1 tablet (10 mg total) by mouth daily. 30 tablet 11  . mometasone (NASONEX) 50 MCG/ACT nasal spray Place 2 sprays into the nose daily. 17 g 12  . tretinoin (RETIN-A) 0.05 % cream Apply 1 application topically at bedtime.    . triamcinolone ointment (KENALOG) 0.5 % Apply 1 application topically 2 (two) times daily. 30 g 0   No current facility-administered medications for this visit.     Allergies: Codeine, Doxycycline, Epinephrine, Neomycin, Penicillins, Protonix [pantoprazole sodium], Sulfonamide derivatives, and Zithromax [azithromycin]  Past Medical History:  Diagnosis Date  . Allergic rhinitis, cause unspecified   . Backache, unspecified   . Benign fasciculation-cramp syndrome 12/03/2012    Worsening with  stress, fatigue .  [name] Calculus of kidney   . Conversion disorder   . Cotton wool spots   . Esophageal reflux   . Floater, vitreous, left June 15, 2016  . GERD (gastroesophageal reflux disease)   . Hemorrhage of rectum and anus   . Internal hemorrhoids without mention of complication   . Irritable bowel syndrome   . Microscopic hematuria   . Migraine headache with aura   . Mild aortic insufficiency   . Mild tricuspid regurgitation   . Osteopenia   . Other plastic surgery for unacceptable cosmetic appearance    Inj tx fllers/expander  . Personal history of urinary calculi   . Postmenopausal atrophic vaginitis   . Premature atrial contractions   . PVC (premature ventricular contraction)    a. Event monitor 2013.  [name] Unspecified constipation   . Vasovagal syncope     Past Surgical History:  Procedure Laterality Date  . BREAST BIOPSY Right   . Cosmetic  Procedures w/Injection therapy    . SKIN CANCER EXCISION Left    thigh  . WISDOM TOOTH EXTRACTION      Family History  Problem Relation Age of Onset  . Hyperlipidemia Father   . COPD Father        Smoker, deceased 06/15/14  . Hypertension Father   . Hyperlipidemia Mother   . Hypertension Mother   . Diabetes Neg Hx   . Colon cancer Neg Hx   . Breast cancer Neg Hx   . Coronary artery disease Neg Hx   . Esophageal cancer Neg Hx   . Stomach cancer Neg Hx   . Pancreatic cancer Neg Hx   . Liver disease Neg Hx     Social History   Tobacco Use  . Smoking status: Never Smoker  . Smokeless tobacco: Never Used  Substance Use Topics  . Alcohol use: No    Subjective:    I connected with [name] on 12/12/18 at  9:20 AM EDT by a video enabled telemedicine application and verified that I am speaking with the correct person using two identifiers. Patient and I are the only 2  people on the video call.   I discussed the limitations of evaluation and management by telemedicine and the availability of in person appointments. The patient expressed understanding and agreed to proceed.  Patient is concerned about 1 week history of increased vaginal itching; seemed to start after using her Premarin cream recently but notes she has used Premarin for "years" with no difficulty; notes that since she has not been able to go to the gym with COVID restrictions, has been riding a stationary bike almost everyday for the past few months; wonders if this could be contributing; no vaginal spotting;     Objective:  There were no vitals filed for this visit.  General: Well developed, well nourished, in no acute distress  Head: Normocephalic and atraumatic  Lungs: Respirations unlabored;  Neurologic: Alert and oriented; speech intact; face symmetrical; moves all extremities well;   Assessment:  1. Vaginal itching     Plan:  ? Yeast infection; try treating with Diflucan; encouraged to change  into dry pants as soon as she finishes exercising; follow-up if symptoms persist.   No follow-ups on file.  No orders of the defined types were placed in this encounter.   Requested Prescriptions   Signed Prescriptions Disp Refills  . fluconazole (DIFLUCAN) 150 MG tablet 2 tablet 0    Sig: Take 1 tablet (150 mg total) by mouth once for 1 dose. Repeat after 72 hous

## 2018-12-15 DIAGNOSIS — J3089 Other allergic rhinitis: Secondary | ICD-10-CM | POA: Diagnosis not present

## 2018-12-15 DIAGNOSIS — J301 Allergic rhinitis due to pollen: Secondary | ICD-10-CM | POA: Diagnosis not present

## 2018-12-16 DIAGNOSIS — L719 Rosacea, unspecified: Secondary | ICD-10-CM | POA: Diagnosis not present

## 2018-12-16 DIAGNOSIS — W57XXXA Bitten or stung by nonvenomous insect and other nonvenomous arthropods, initial encounter: Secondary | ICD-10-CM | POA: Diagnosis not present

## 2018-12-16 DIAGNOSIS — S50869A Insect bite (nonvenomous) of unspecified forearm, initial encounter: Secondary | ICD-10-CM | POA: Diagnosis not present

## 2018-12-17 DIAGNOSIS — J301 Allergic rhinitis due to pollen: Secondary | ICD-10-CM | POA: Diagnosis not present

## 2018-12-17 DIAGNOSIS — J3089 Other allergic rhinitis: Secondary | ICD-10-CM | POA: Diagnosis not present

## 2018-12-22 DIAGNOSIS — J301 Allergic rhinitis due to pollen: Secondary | ICD-10-CM | POA: Diagnosis not present

## 2018-12-22 DIAGNOSIS — J3089 Other allergic rhinitis: Secondary | ICD-10-CM | POA: Diagnosis not present

## 2018-12-24 DIAGNOSIS — J3089 Other allergic rhinitis: Secondary | ICD-10-CM | POA: Diagnosis not present

## 2018-12-24 DIAGNOSIS — J301 Allergic rhinitis due to pollen: Secondary | ICD-10-CM | POA: Diagnosis not present

## 2018-12-29 DIAGNOSIS — J3089 Other allergic rhinitis: Secondary | ICD-10-CM | POA: Diagnosis not present

## 2018-12-29 DIAGNOSIS — J301 Allergic rhinitis due to pollen: Secondary | ICD-10-CM | POA: Diagnosis not present

## 2018-12-30 DIAGNOSIS — M79644 Pain in right finger(s): Secondary | ICD-10-CM | POA: Diagnosis not present

## 2018-12-31 DIAGNOSIS — J3089 Other allergic rhinitis: Secondary | ICD-10-CM | POA: Diagnosis not present

## 2018-12-31 DIAGNOSIS — J301 Allergic rhinitis due to pollen: Secondary | ICD-10-CM | POA: Diagnosis not present

## 2019-01-05 DIAGNOSIS — J3089 Other allergic rhinitis: Secondary | ICD-10-CM | POA: Diagnosis not present

## 2019-01-05 DIAGNOSIS — J301 Allergic rhinitis due to pollen: Secondary | ICD-10-CM | POA: Diagnosis not present

## 2019-01-07 DIAGNOSIS — J301 Allergic rhinitis due to pollen: Secondary | ICD-10-CM | POA: Diagnosis not present

## 2019-01-07 DIAGNOSIS — J3089 Other allergic rhinitis: Secondary | ICD-10-CM | POA: Diagnosis not present

## 2019-01-13 ENCOUNTER — Other Ambulatory Visit: Payer: Self-pay

## 2019-01-13 ENCOUNTER — Other Ambulatory Visit (HOSPITAL_COMMUNITY)
Admission: RE | Admit: 2019-01-13 | Discharge: 2019-01-13 | Disposition: A | Discharge status: Home / Self Care | Payer: Medicare Other | Source: Ambulatory Visit | Attending: Internal Medicine | Admitting: Internal Medicine

## 2019-01-13 ENCOUNTER — Encounter: Payer: Self-pay | Admitting: Internal Medicine

## 2019-01-13 ENCOUNTER — Ambulatory Visit (INDEPENDENT_AMBULATORY_CARE_PROVIDER_SITE_OTHER)
Admission: RE | Admit: 2019-01-13 | Discharge: 2019-01-13 | Disposition: A | Discharge status: Home / Self Care | Payer: Medicare Other | Source: Ambulatory Visit | Attending: Internal Medicine | Admitting: Internal Medicine

## 2019-01-13 ENCOUNTER — Other Ambulatory Visit (INDEPENDENT_AMBULATORY_CARE_PROVIDER_SITE_OTHER): Payer: Medicare Other

## 2019-01-13 ENCOUNTER — Ambulatory Visit (INDEPENDENT_AMBULATORY_CARE_PROVIDER_SITE_OTHER): Payer: Medicare Other | Admitting: Internal Medicine

## 2019-01-13 VITALS — BP 164/90 | HR 90 | Temp 98.7°F | Ht 64.5 in | Wt 101.0 lb

## 2019-01-13 DIAGNOSIS — I491 Atrial premature depolarization: Secondary | ICD-10-CM

## 2019-01-13 DIAGNOSIS — Z124 Encounter for screening for malignant neoplasm of cervix: Secondary | ICD-10-CM | POA: Diagnosis not present

## 2019-01-13 DIAGNOSIS — E559 Vitamin D deficiency, unspecified: Secondary | ICD-10-CM

## 2019-01-13 DIAGNOSIS — G43109 Migraine with aura, not intractable, without status migrainosus: Secondary | ICD-10-CM

## 2019-01-13 DIAGNOSIS — I493 Ventricular premature depolarization: Secondary | ICD-10-CM | POA: Diagnosis not present

## 2019-01-13 DIAGNOSIS — E785 Hyperlipidemia, unspecified: Secondary | ICD-10-CM

## 2019-01-13 DIAGNOSIS — Z780 Asymptomatic menopausal state: Secondary | ICD-10-CM

## 2019-01-13 DIAGNOSIS — F411 Generalized anxiety disorder: Secondary | ICD-10-CM

## 2019-01-13 DIAGNOSIS — R002 Palpitations: Secondary | ICD-10-CM | POA: Diagnosis not present

## 2019-01-13 DIAGNOSIS — R7989 Other specified abnormal findings of blood chemistry: Secondary | ICD-10-CM

## 2019-01-13 DIAGNOSIS — Z0000 Encounter for general adult medical examination without abnormal findings: Secondary | ICD-10-CM

## 2019-01-13 DIAGNOSIS — E538 Deficiency of other specified B group vitamins: Secondary | ICD-10-CM

## 2019-01-13 LAB — URINALYSIS, ROUTINE W REFLEX MICROSCOPIC
Bilirubin Urine: NEGATIVE
Ketones, ur: NEGATIVE
Leukocytes,Ua: NEGATIVE
Nitrite: NEGATIVE
Specific Gravity, Urine: 1.02 (ref 1.000–1.030)
Total Protein, Urine: NEGATIVE
Urine Glucose: NEGATIVE
Urobilinogen, UA: 0.2 (ref 0.0–1.0)
pH: 6.5 (ref 5.0–8.0)

## 2019-01-13 LAB — CBC WITH DIFFERENTIAL/PLATELET
Basophils Absolute: 0.1 10*3/uL (ref 0.0–0.1)
Basophils Relative: 1.4 % (ref 0.0–3.0)
Eosinophils Absolute: 0.1 10*3/uL (ref 0.0–0.7)
Eosinophils Relative: 3.2 % (ref 0.0–5.0)
HCT: 40 % (ref 36.0–46.0)
Hemoglobin: 13.4 g/dL (ref 12.0–15.0)
Lymphocytes Relative: 28.8 % (ref 12.0–46.0)
Lymphs Abs: 1.2 10*3/uL (ref 0.7–4.0)
MCHC: 33.5 g/dL (ref 30.0–36.0)
MCV: 93.2 fl (ref 78.0–100.0)
Monocytes Absolute: 0.3 10*3/uL (ref 0.1–1.0)
Monocytes Relative: 8.3 % (ref 3.0–12.0)
Neutro Abs: 2.4 10*3/uL (ref 1.4–7.7)
Neutrophils Relative %: 58.3 % (ref 43.0–77.0)
Platelets: 190 10*3/uL (ref 150.0–400.0)
RBC: 4.29 Mil/uL (ref 3.87–5.11)
RDW: 12.9 % (ref 11.5–15.5)
WBC: 4.1 10*3/uL (ref 4.0–10.5)

## 2019-01-13 LAB — VITAMIN D 25 HYDROXY (VIT D DEFICIENCY, FRACTURES): VITD: 33.03 ng/mL (ref 30.00–100.00)

## 2019-01-13 LAB — HEPATIC FUNCTION PANEL
ALT: 14 U/L (ref 0–35)
AST: 16 U/L (ref 0–37)
Albumin: 4.7 g/dL (ref 3.5–5.2)
Alkaline Phosphatase: 70 U/L (ref 39–117)
Bilirubin, Direct: 0.1 mg/dL (ref 0.0–0.3)
Total Bilirubin: 0.5 mg/dL (ref 0.2–1.2)
Total Protein: 7.6 g/dL (ref 6.0–8.3)

## 2019-01-13 LAB — LIPID PANEL
Cholesterol: 213 mg/dL — ABNORMAL HIGH (ref 0–200)
HDL: 73.1 mg/dL (ref >39.00)
LDL Cholesterol: 117 mg/dL — ABNORMAL HIGH (ref 0–99)
NonHDL: 139.54
Total CHOL/HDL Ratio: 3
Triglycerides: 113 mg/dL (ref 0.0–149.0)
VLDL: 22.6 mg/dL (ref 0.0–40.0)

## 2019-01-13 LAB — BASIC METABOLIC PANEL
BUN: 18 mg/dL (ref 6–23)
CO2: 29 mEq/L (ref 19–32)
Calcium: 9.8 mg/dL (ref 8.4–10.5)
Chloride: 103 mEq/L (ref 96–112)
Creatinine, Ser: 0.78 mg/dL (ref 0.40–1.20)
GFR: 73.84 mL/min (ref >60.00)
Glucose, Bld: 90 mg/dL (ref 70–99)
Potassium: 4.1 mEq/L (ref 3.5–5.1)
Sodium: 140 mEq/L (ref 135–145)

## 2019-01-13 LAB — TSH: TSH: 2.01 u[IU]/mL (ref 0.35–4.50)

## 2019-01-13 NOTE — Patient Instructions (Signed)
If you have medicare related insurance (such as traditional Medicare, Blue Cross Medicare, United Health[employer] Medicare, or similar), Please make an appointment at the scheduling desk with Jill, the Wellness Health Coach, for your Wellness visit in this office, which is a benefit with your insurance.  

## 2019-01-13 NOTE — Progress Notes (Signed)
Subjective:  Patient ID: [name], female    DOB: 18-Aug-1950  Age: 68 y.o. MRN: [id number]  CC: No chief complaint on file.   HPI [name] presents for a PAP smear F/u palpitations - was wearing a monitor  Outpatient Medications Prior to Visit  Medication Sig Dispense Refill  . ALPRAZolam (XANAX) 0.25 MG tablet 0.5-1 po bid prn anxiety 30 tablet 1  . Botulinum Toxin Type A, Cosm, (BOTOX COSMETIC) 50 UNITS SOLR Inject 4-6 Units into the muscle. 3 month  injection    . conjugated estrogens (PREMARIN) vaginal cream Use PV once every 2-3 days 90 g 2  . Cyanocobalamin (VITAMIN B-12) 1000 MCG SUBL Place 1 tablet (1,000 mcg total) under the tongue every other day. 100 tablet 3  . dexlansoprazole (DEXILANT) 60 MG capsule Take 1 capsule (60 mg total) by mouth daily. 30 capsule 11  . EPIPEN 2-PAK 0.3 MG/0.3ML SOAJ injection See admin instructions. Reported on 10/08/2015  1  . flecainide (TAMBOCOR) 50 MG tablet Take 50 mg by mouth as needed (take 1 tablet as needed for fast heart rate).    . fluocinonide cream (LIDEX) 1.51 % Apply 1 application topically 2 (two) times daily. 90 g 2  . hyoscyamine (LEVSIN SL) 0.125 MG SL tablet Place 1 tablet (0.125 mg total) under the tongue every 4 (four) hours as needed (for throat tightness). 30 tablet 3  . loratadine (CLARITIN) 10 MG tablet Take 1 tablet (10 mg total) by mouth daily. 30 tablet 11  . mometasone (NASONEX) 50 MCG/ACT nasal spray Place 2 sprays into the nose daily. 17 g 12  . tretinoin (RETIN-A) 0.05 % cream Apply 1 application topically at bedtime.    . triamcinolone ointment (KENALOG) 0.5 % Apply 1 application topically 2 (two) times daily. 30 g 0  . urea (CARMOL) 40 % CREA 1 APPLICATION AS NEEDED ONCE A DAY EXTERNALLY 30 DAYS     No facility-administered medications prior to visit.     ROS: Review of Systems  Constitutional: Negative for activity change, appetite change, chills, fatigue and unexpected weight change.   HENT: Negative for congestion, mouth sores and sinus pressure.   Eyes: Negative for visual disturbance.  Respiratory: Negative for cough and chest tightness.   Gastrointestinal: Negative for abdominal pain and nausea.  Genitourinary: Negative for difficulty urinating, frequency and vaginal pain.  Musculoskeletal: Negative for back pain and gait problem.  Skin: Negative for pallor and rash.  Neurological: Negative for dizziness, tremors, weakness, numbness and headaches.  Psychiatric/Behavioral: Negative for confusion and sleep disturbance.    Objective:  BP (!) 164/90 (BP Location: Left Arm, Patient Position: Sitting, Cuff Size: Normal)   Pulse 90   Temp 98.7 F (37.1 C) (Oral)   Ht 5' 4.5" (1.638 m)   Wt 101 lb (45.8 kg)   SpO2 98%   BMI 17.07 kg/m   BP Readings from Last 3 Encounters:  01/13/19 (!) 164/90  10/31/18 (!) 112/57  10/06/18 104/70    Wt Readings from Last 3 Encounters:  01/13/19 101 lb (45.8 kg)  10/31/18 100 lb (45.4 kg)  10/06/18 98 lb (44.5 kg)    Physical Exam Constitutional:      General: She is not in acute distress.    Appearance: She is well-developed.  HENT:     Head: Normocephalic.     Right Ear: External ear normal.     Left Ear: External ear normal.     Nose: Nose normal.  Eyes:  General:        Right eye: No discharge.        Left eye: No discharge.     Conjunctiva/sclera: Conjunctivae normal.     Pupils: Pupils are equal, round, and reactive to light.  Neck:     Musculoskeletal: Normal [name] of motion and neck supple.     Thyroid: No thyromegaly.     Vascular: No JVD.     Trachea: No tracheal deviation.  Cardiovascular:     Rate and Rhythm: Normal rate and regular rhythm.     Heart sounds: Normal heart sounds.  Pulmonary:     Effort: No respiratory distress.     Breath sounds: No stridor. No wheezing.  Abdominal:     General: Bowel sounds are normal. There is no distension.     Palpations: Abdomen is soft. There is no mass.      Tenderness: There is no abdominal tenderness. There is no guarding or rebound.  Genitourinary:    General: Normal vulva.     Vagina: No vaginal discharge.     Rectum: Normal. Guaiac result negative.  Musculoskeletal:        General: No tenderness.  Lymphadenopathy:     Cervical: No cervical adenopathy.  Skin:    Findings: No erythema or rash.  Neurological:     Cranial Nerves: No cranial nerve deficit.     Motor: No abnormal muscle tone.     Coordination: Coordination normal.     Deep Tendon Reflexes: Reflexes normal.  Psychiatric:        Behavior: Behavior normal.        Thought Content: Thought content normal.        Judgment: Judgment normal.   cervix nl, PAP obtained B breasts MNL  Procedure: EKG Indication: palpitations Impression: NSR. No acute changes.  Lab Results  Component Value Date   WBC 7.4 03/03/2018   HGB 13.1 03/03/2018   HCT 38.2 03/03/2018   PLT 215.0 03/03/2018   GLUCOSE 89 08/04/2018   CHOL 204 (H) 03/03/2018   TRIG 197.0 (H) 03/03/2018   HDL 66.70 03/03/2018   LDLCALC 98 03/03/2018   ALT 13 03/03/2018   AST 16 03/03/2018   NA 145 (H) 08/04/2018   K 4.4 08/04/2018   CL 106 08/04/2018   CREATININE 0.87 08/04/2018   BUN 22 08/04/2018   CO2 22 08/04/2018   TSH 1.770 08/04/2018    Dg Esophagus  Result Date: 06/02/2018 CLINICAL DATA:  Tightness in the throat. EXAM: ESOPHOGRAM / BARIUM SWALLOW / BARIUM TABLET STUDY TECHNIQUE: Combined double contrast and single contrast examination performed using effervescent crystals, thick barium liquid, and thin barium liquid. The patient was observed with fluoroscopy swallowing a 13 mm barium sulphate tablet. FLUOROSCOPY TIME:  Fluoroscopy Time:  1 minutes and 12 seconds. Radiation Exposure Index (if provided by the fluoroscopic device): 16.3 mGy COMPARISON:  None. FINDINGS: Frontal and lateral views of the hypopharynx while swallowing are normal. Double contrast imaging of the esophagus is normal.  Esophageal motility is normal with good preservation of primary peristalsis. 13 mm barium tablet passes readily into the stomach when taken with water. IMPRESSION: Normal double contrast barium esophagram. Electronically Signed   By: Misty Stanley M.D.   On: 06/02/2018 12:25    Assessment & Plan:   There are no diagnoses linked to this encounter.   No orders of the defined types were placed in this encounter.    Follow-up: No follow-ups on file.  [name], MD

## 2019-01-13 NOTE — Assessment & Plan Note (Signed)
Worse w/COVID pandemic

## 2019-01-13 NOTE — Assessment & Plan Note (Signed)
ASA

## 2019-01-13 NOTE — Assessment & Plan Note (Signed)
Last B12 was good

## 2019-01-13 NOTE — Assessment & Plan Note (Addendum)
PAP/breast exam Mammo q 12 mo Eye appt q 12 mo Colon 2014

## 2019-01-13 NOTE — Assessment & Plan Note (Signed)
Labs Telemetry transfer prn

## 2019-01-13 NOTE — Assessment & Plan Note (Signed)
EKG

## 2019-01-13 NOTE — Assessment & Plan Note (Signed)
Labs

## 2019-01-15 LAB — CYTOLOGY - PAP
Adequacy: ABSENT
Diagnosis: NEGATIVE

## 2019-01-19 DIAGNOSIS — J3089 Other allergic rhinitis: Secondary | ICD-10-CM | POA: Diagnosis not present

## 2019-01-19 DIAGNOSIS — J301 Allergic rhinitis due to pollen: Secondary | ICD-10-CM | POA: Diagnosis not present

## 2019-02-02 DIAGNOSIS — J3089 Other allergic rhinitis: Secondary | ICD-10-CM | POA: Diagnosis not present

## 2019-02-02 DIAGNOSIS — J301 Allergic rhinitis due to pollen: Secondary | ICD-10-CM | POA: Diagnosis not present

## 2019-02-10 DIAGNOSIS — H2513 Age-related nuclear cataract, bilateral: Secondary | ICD-10-CM | POA: Diagnosis not present

## 2019-02-10 DIAGNOSIS — H43393 Other vitreous opacities, bilateral: Secondary | ICD-10-CM | POA: Diagnosis not present

## 2019-02-10 DIAGNOSIS — H43812 Vitreous degeneration, left eye: Secondary | ICD-10-CM | POA: Diagnosis not present

## 2019-02-11 ENCOUNTER — Other Ambulatory Visit: Payer: Self-pay | Admitting: Internal Medicine

## 2019-02-16 DIAGNOSIS — J301 Allergic rhinitis due to pollen: Secondary | ICD-10-CM | POA: Diagnosis not present

## 2019-02-16 DIAGNOSIS — J3089 Other allergic rhinitis: Secondary | ICD-10-CM | POA: Diagnosis not present

## 2019-02-20 DIAGNOSIS — H11121 Conjunctival concretions, right eye: Secondary | ICD-10-CM | POA: Diagnosis not present

## 2019-03-02 DIAGNOSIS — J301 Allergic rhinitis due to pollen: Secondary | ICD-10-CM | POA: Diagnosis not present

## 2019-03-02 DIAGNOSIS — J3089 Other allergic rhinitis: Secondary | ICD-10-CM | POA: Diagnosis not present

## 2019-03-08 ENCOUNTER — Other Ambulatory Visit: Payer: Self-pay | Admitting: Internal Medicine

## 2019-03-08 DIAGNOSIS — R922 Inconclusive mammogram: Secondary | ICD-10-CM

## 2019-03-10 ENCOUNTER — Telehealth: Payer: Self-pay | Admitting: Internal Medicine

## 2019-03-10 DIAGNOSIS — R922 Inconclusive mammogram: Secondary | ICD-10-CM

## 2019-03-10 NOTE — Telephone Encounter (Signed)
faxed

## 2019-03-10 NOTE — Telephone Encounter (Signed)
[facility] needs order for pt to have ultrasound. Pt is schedule for mammogram on 03-19-2019. Please fax order [phone]

## 2019-03-11 ENCOUNTER — Ambulatory Visit: Payer: Medicare Other

## 2019-03-16 DIAGNOSIS — J3089 Other allergic rhinitis: Secondary | ICD-10-CM | POA: Diagnosis not present

## 2019-03-16 DIAGNOSIS — J301 Allergic rhinitis due to pollen: Secondary | ICD-10-CM | POA: Diagnosis not present

## 2019-03-19 DIAGNOSIS — Z1231 Encounter for screening mammogram for malignant neoplasm of breast: Secondary | ICD-10-CM | POA: Diagnosis not present

## 2019-03-19 DIAGNOSIS — R922 Inconclusive mammogram: Secondary | ICD-10-CM | POA: Diagnosis not present

## 2019-03-19 LAB — HM MAMMOGRAPHY

## 2019-03-30 DIAGNOSIS — J3089 Other allergic rhinitis: Secondary | ICD-10-CM | POA: Diagnosis not present

## 2019-03-30 DIAGNOSIS — J301 Allergic rhinitis due to pollen: Secondary | ICD-10-CM | POA: Diagnosis not present

## 2019-04-01 ENCOUNTER — Ambulatory Visit (INDEPENDENT_AMBULATORY_CARE_PROVIDER_SITE_OTHER): Payer: Medicare Other

## 2019-04-01 ENCOUNTER — Other Ambulatory Visit: Payer: Self-pay

## 2019-04-01 DIAGNOSIS — Z23 Encounter for immunization: Secondary | ICD-10-CM

## 2019-04-09 ENCOUNTER — Encounter: Payer: Self-pay | Admitting: Neurology

## 2019-04-09 ENCOUNTER — Ambulatory Visit (INDEPENDENT_AMBULATORY_CARE_PROVIDER_SITE_OTHER): Payer: Medicare Other | Admitting: Neurology

## 2019-04-09 ENCOUNTER — Other Ambulatory Visit: Payer: Self-pay

## 2019-04-09 VITALS — BP 154/82 | HR 80 | Temp 98.0°F | Ht 62.0 in | Wt 102.0 lb

## 2019-04-09 DIAGNOSIS — I493 Ventricular premature depolarization: Secondary | ICD-10-CM

## 2019-04-09 DIAGNOSIS — R768 Other specified abnormal immunological findings in serum: Secondary | ICD-10-CM

## 2019-04-09 DIAGNOSIS — G709 Myoneural disorder, unspecified: Secondary | ICD-10-CM

## 2019-04-09 DIAGNOSIS — R253 Fasciculation: Secondary | ICD-10-CM

## 2019-04-09 DIAGNOSIS — H532 Diplopia: Secondary | ICD-10-CM

## 2019-04-09 DIAGNOSIS — G43109 Migraine with aura, not intractable, without status migrainosus: Secondary | ICD-10-CM

## 2019-04-09 MED ORDER — CEFALY KIT DEVI: 1.0000 [IU] | Freq: Two times a day (BID) | 1 refills | Status: DC

## 2019-04-09 NOTE — Progress Notes (Signed)
[facility]  Provider:  Dr Brett Fairy Referring Provider: Plotnikov, [name], MD Primary Care Physician:  Plotnikov, [name], MD    HPI:  [name] is a 68 y.o. female here as a patient of  Dr. [name] for follow up on her headaches.     RV  04-09-2019, retired from apartment managing, husband retired Emergency planning/management officer.  She still reports auras but no headaches after these. Rare left eye / temple headache 2 times a mont and these responds to Motrin within hours.  She has benign fasciculations. Takes cosmetic Botox and feels it helps with headches. I will arrange for Cefaly device and perhaps TMS procedure, depending on costs.   I see this patient today , 04-07-2018, in a yearly follow up-  Her husband just retired this Summer, is home.  She had a visual field restriction on one day,  Occurrence on August 7th 2019 - she had seen ophthalmology on call ,followed by her regular eye specialist , [name]. Than was send for carotid doppler , which revealed wide open arteries ( Dr. Angelena Form). On 02-28-2018 she suddenly felt a severe neck pain. She had reached for something on the floor of her car- The pain went away, but the tenderness remained over the right occipital area. She wonders about occipital neuralgia. She still has benign fasciculations. She still takes omeprazole.   I had the pleasure of seeing [name] today on 04/01/2017, she has noticed that she has more headaches since taking pantoprazole. She started an every other day regimen and noted that headaches aren't changing according to the day of intake. She still struggles with reflux however and is awaiting appointments with Dr. Dorann Lodge often Dr. Henrene Pastor to address his underlying condition. Reflux causes her to cough at night and in daytime. She has also noted some heart palpitations, transiently reported substernal chest wall fasciculations that seemed to have resolved as spontaneously as they came.  I  have the pleasure of seeing [name] today on 12/18/2016. She reports that she has still freedom from headaches, her fasciculations have much resolved, the lifestyle changes she told me about in 2017 have really helped her.  She does have some neck arthritis and she has noticed that sometimes pain seems to radiate from the neck towards the head. Headaches may be announced by an AURA, nausea, resolve with motrin, can be associated with phonophobia.  We wondered if we should obtain an MRI brain and cervical spine at the same time, I would be very much in favor of this, and Mrs. Berte will still think about. In addition she reports that her husband plans to retire next year. She has noted that she sleeps best when alone. Her husband is a Insurance underwriter and is often not at home, she is worried that when they finally lift and sleep together in the same room that would be more interruption of her sleep but also her snoring may interrupt his sleep. For this reason we will undergo a home sleep test to see if she has apnea or is truly just snoring. Again, most of the night she will spend alone and there is no witness to her asleep. She continues to be physically active, with a low body mass index and no medical conditions of concern at this time.   HPI- beginning 2012 :  The patient reports that she had 2 spells of near-syncope one in May 2014 and one in June of 2014 . She also has a history of migraines  with aura, benign fasciculations which have affected also facial muscles, at times presenting like a tic. The benign fasciculations have been present for over 2 decades and are not related to a minor atrophic lateral sclerosis. Pulse of the near-syncope spells but the patient reported had a diplopia component and appeared to have been experienced as a vertical. The patient reports that the first spell was likely related to dehydration but is not quite sure about the second. The spells don't last long and if she takes  fluid and fluid they quickly resolve and do not return. She was sent by her primary care physician for an MRI of the brain which returned normal. She has been seen by [name]  for the brief dizziness, loss of vision , diplopia. She is diagnosed of with macular degeneration early stage , myopia, and ocular migraines.  Vision was 20/25 in both eyes, ocular  pressure was 18 in the right, and 19 mm in the left eye and [name]  reported that the patient is early cataract formation but she can see well.The optic nerves are healthy , but there is a macular drusen in the right eye he recommended ocular vitamins and to continue contact lens use the patient was otherwise found to be in good health ,and he did not impose any restrictions on driving activities of daily living etc. The detailed report is reviewed here today as is her MRI report which was entirely normal. She has meanwhile reduced or eliminated her caffeine intake and noticed a reduction in twitches. Her results of the EMG and NCV study have helped her to relax and fee safe with her condition. She is snoring now, which doesn't bother her but her spouse.  She has noted a feeling of thumping at the right ear and her ENT suggested that this may be a manifestation of her fasciculations. Her parents lives in [place], [place]. Close to her sister, her father is in a rehab facility. Her husband is an Emergency planning/management officer, now with [employer].   09-13-2015 Benign fasciculations resolved !  Patient presents now with an ear pain, left side only. Nothing visible on the left ear.  Her orthodontist noted some TMJ  click.  At the same time she had half sided headaches. These were not neuralgic and have resolved.  The patient does not recall having any zoster outbreaks, she did have some aphtose stomatitis non-herpetic. Dentist found no herpetic lesions and Crista Luria tested for zoster- negative. Since she does have a slight click at the left temporomandibular joint I  recommend that she will purchase a baby toothbrush, one of those that are worn on top of the index finger like a thimble, and massages from the inside the area of the TMJ joint, she can use it on the outside as well.  This helps TMJ pain and click to disappear quicker. At this time I'm just happy that her symptoms have resolved and I'm happy to see her for any recurrent issues or new problems in the future.  02-15-2016, Mrs. Geno has experienced an isolated event of diplopia that she related to the left eye. She also has a recurrence of migrainous headaches but they're all a has changed. She used to experience fortification lines which is  now associated with a very bright light perception. The visual aura the last 20-30 minutes. A lot of times there is no headache to follow. She experienced the diplopia before she saw her ophthalmologist [name], who then ordered a  carotid Doppler study which returned with clinically insignificant results. Today, her headache is more like a pressure. She continued allergy shots with [name]. A few years ago she has experienced vertical diplopia, and MRI in 2014  was negative- ordered by Dr [name] at the time  . She may take 3 Motrin in a week. I explained that migraines can change to this state with reaching the 5th decade of life. Many women that experienced severe nausea and headaches associated with menstrual cycles, has later in life the aura  without headaches to follow . Is benign fasciculations there is truly not changed there is no treatment available nor is it necessary. Is important for the patient to stay hydrated and could not get hypoglycemic as these will lead to presyncopal spells as well as an aunt who took and frequency rise in fasciculations, an  asked her to restrict caffeine intake.  03-27-2016,  patient reports resolution of headaches after quitting chocolate covered almonds ! No more diplopia , rare fasciculation. All tests were normal.      Review of Systems: Out of a complete 14 system review, the patient complains of only the following symptoms, and all other reviewed systems are negative. Less twitching . Snoring reported again, she is complaining of nasal drip , eye itching . HST negative .She is treated by Warren Lacy, MD at [facility]. She inquired about a sleep evaluation. I would like for her to use her nasal spray at night to see if she would stop mouth breathing which is the main reason for snoring. She reports diplopia, and related this to Dr [name] who in return ordered to evaluate her carotid artery- again negative for significant stenosis - 01-2018.   Social History   Socioeconomic History  . Marital status: Married    Spouse name: [name]  . Number of children: 0  . Years of education: 53  . Highest education level: Not on file  Occupational History  . Occupation: OFFICE liasion    Employer: [employer]  . Occupation: Engineer, structural  . Occupation: OFFICE LE[employer]ON    Employer: [employer]  Social Needs  . Financial resource strain: Not on file  . Food insecurity    Worry: Not on file    Inability: Not on file  . Transportation needs    Medical: Not on file    Non-medical: Not on file  Tobacco Use  . Smoking status: Never Smoker  . Smokeless tobacco: Never Used  Substance and Sexual Activity  . Alcohol use: No  . Drug use: No  . Sexual activity: Not on file  Lifestyle  . Physical activity    Days per week: Not on file    Minutes per session: Not on file  . Stress: Not on file  Relationships  . Social Herbalist on phone: Not on file    Gets together: Not on file    Attends religious service: Not on file    Active member of club or organization: Not on file    Attends meetings of clubs or organizations: Not on file    Relationship status: Not on file  . Intimate partner violence    Fear of current or ex partner: Not on file    Emotionally abused: Not  on file    Physically abused: Not on file    Forced sexual activity: Not on file  Other Topics Concern  . Not on file  Social History  Narrative   Vocational School in [place]. Married '90 - marriage is in very good shape '12, No children.  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 Offerred medical services for them if needed (Nov '11)   Patient is married [name]) and lives at home with her husband.   Patient is working Administrator, arts work.   Patient has a high school education.   Patient is right-handed.   Patient does not drink any caffeine.       Family History  Problem Relation Age of Onset  . Hyperlipidemia Father   . COPD Father        Smoker, deceased Jul 02, 2014  . Hypertension Father   . Hyperlipidemia Mother   . Hypertension Mother   . Diabetes Neg Hx   . Colon cancer Neg Hx   . Breast cancer Neg Hx   . Coronary artery disease Neg Hx   . Esophageal cancer Neg Hx   . Stomach cancer Neg Hx   . Pancreatic cancer Neg Hx   . Liver disease Neg Hx     Past Medical History:  Diagnosis Date  . Allergic rhinitis, cause unspecified   . Backache, unspecified   . Benign fasciculation-cramp syndrome 12/03/2012    Worsening with  stress, fatigue .  [name] Calculus of kidney   . Conversion disorder   . Cotton wool spots   . Esophageal reflux   . Floater, vitreous, left Jul 02, 2016  . GERD (gastroesophageal reflux disease)   . Hemorrhage of rectum and anus   . Internal hemorrhoids without mention of complication   . Irritable bowel syndrome   . Microscopic hematuria   . Migraine headache with aura   . Mild aortic insufficiency   . Mild tricuspid regurgitation   . Osteopenia   . Other plastic surgery for unacceptable cosmetic appearance    Inj tx fllers/expander  . Personal history of urinary calculi   . Postmenopausal atrophic vaginitis   . Premature atrial contractions   . PVC (premature  ventricular contraction)    a. Event monitor 2013.  [name] Unspecified constipation   . Vasovagal syncope     Past Surgical History:  Procedure Laterality Date  . BREAST BIOPSY Right   . Cosmetic Procedures w/Injection therapy    . SKIN CANCER EXCISION Left    thigh  . WISDOM TOOTH EXTRACTION      Current Outpatient Medications  Medication Sig Dispense Refill  . ALPRAZolam (XANAX) 0.25 MG tablet 0.5-1 po bid prn anxiety 30 tablet 1  . Botulinum Toxin Type A, Cosm, (BOTOX COSMETIC) 50 UNITS SOLR Inject 4-6 Units into the muscle. 3 month  injection    . conjugated estrogens (PREMARIN) vaginal cream Use PV once every 2-3 days 90 g 2  . Cyanocobalamin (VITAMIN B-12) 1000 MCG SUBL PLACE 1 TABLET (1,000 MCG TOTAL) UNDER THE TONGUE DAILY. 100 tablet 3  . dexlansoprazole (DEXILANT) 60 MG capsule Take 1 capsule (60 mg total) by mouth daily. 30 capsule 11  . flecainide (TAMBOCOR) 50 MG tablet Take 50 mg by mouth as needed (take 1 tablet as needed for fast heart rate).    . fluocinonide cream (LIDEX) [id number] % Apply 1 application topically 2 (two) times daily. 90 g 2  . hyoscyamine (LEVSIN SL) 0.125 MG SL tablet Place 1 tablet (0.125 mg total) under the tongue every 4 (four) hours as needed (for throat tightness). 30 tablet 3  .  loratadine (CLARITIN) 10 MG tablet Take 1 tablet (10 mg total) by mouth daily. 30 tablet 11  . mometasone (NASONEX) 50 MCG/ACT nasal spray Place 2 sprays into the nose daily. 17 g 12  . tretinoin (RETIN-A) 0.05 % cream Apply 1 application topically at bedtime.    . triamcinolone ointment (KENALOG) 0.5 % Apply 1 application topically 2 (two) times daily. 30 g 0  . urea (CARMOL) 40 % CREA 1 APPLICATION AS NEEDED ONCE A DAY EXTERNALLY 30 DAYS    . EPIPEN 2-PAK 0.3 MG/0.3ML SOAJ injection See admin instructions. Reported on 10/08/2015  1   No current facility-administered medications for this visit.     Allergies as of 04/09/2019 - Review Complete 04/09/2019  Allergen Reaction  Noted  . Codeine Other (See Comments)   . Doxycycline Other (See Comments) 05/31/2016  . Epinephrine  08/12/2012  . Neomycin  03/08/2015  . Penicillins Hives and Other (See Comments)   . Protonix [pantoprazole sodium]  06/06/2017  . Sulfonamide derivatives Other (See Comments)   . Zithromax [azithromycin] Rash 08/18/2014    Vitals: BP (!) 154/82   Pulse 80   Temp 98 F (36.7 C)   Ht 5\' 2"  (1.575 m)   Wt 102 lb (46.3 kg)   BMI 18.66 kg/m  Last Weight:  Wt Readings from Last 1 Encounters:  04/09/19 102 lb (46.3 kg)   Last Height:   Ht Readings from Last 1 Encounters:  04/09/19 5\' 2"  (1.575 m)   Vision Screening:  Left eye with correction 25/20.  Right eye with correction 25/20. Monovision, contacts.   Physical exam:  General: The patient is awake, alert and appears not in acute distress. The patient is well groomed. Head: Normocephalic, atraumatic. Neck is supple. Mallampati 1- wide open-  neck circumference 12.5 inches, No retrognathia. TMJ click on the right.  Cardiovascular:  Regular rate and rhythm, without  murmurs or carotid bruit, and without distended neck veins.  Respiratory: Lungs are clear to auscultation. Skin:  Without evidence of edema, or rash Trunk: BMI low normal.  Neurologic exam : The patient is awake and alert, oriented to place and time.   Memory subjective described as intact. There is a normal attention span & concentration ability.  Speech is fluent without  dysarthria, dysphonia or aphasia. Mood and affect are appropriate.  Cranial nerves: Pupils are equal and briskly reactive to light. Extraocular movements  in vertical and horizontal planes intact and without nystagmus.  Hearing to finger rub intact.  Facial sensation intact to fine touch. Facial motor strength is symmetric and tongue and uvula move midline. Motor exam: Normal tone and normal muscle bulk and symmetric normal strength in all extremities. No fasciculations.  Sensory:  Fine touch,  pinprick and vibration were normal  in all extremities.  Coordination: Rapid alternating movements in the fingers/hands is normal.  Finger-to-nose maneuver tested and normal without evidence of ataxia, dysmetria or tremor. Gait and station: Patient walks without assistive device.  Deep tendon reflexes: in the upper and lower extremities are symmetric and intact.   Assessment:  After physical and neurologic examination, review of laboratory studies, imaging, neurophysiology testing and pre-existing records.  Benign fasciculations resolved ! She stayed off caffeine. She has rarely palpitations, since reflux is controlled. Dr. Henrene Pastor is her GI specialist. She is not drinking alcohol, not eating late.  Her orthodontist noted some TMJ click, will address snoring in a device- she has no need for CPAP. Her HST was negative for sleep apnea.  Headaches resolved since August 2018 after she stopped eating chocolate covered almonds.  Diplopia horizontal, related to stress situations isolated events- , no longer present since early 2018. Amaurosis fugax episode versus complicated migraine- negative dopplers. .    Yearly follow up next in 2020    Larey Seat, MD   CC Dr. Loni Muse. Plotnikov

## 2019-04-13 ENCOUNTER — Telehealth: Payer: Self-pay | Admitting: Neurology

## 2019-04-13 NOTE — Telephone Encounter (Signed)
Called the patient back and she was questioning the cephaly device. She had some concerns related to the device and I advised that we had also was under the impression that they were not ordering this due to some side effects of burning on the skin as well as the patient states that she gets botox in the area where the device would go and she was nervous about that. She states that she will hold off on that for now. Patient then states that she did receive a call in regards to a referral being placed. Pt states that it was behavioral psychiatry that called her and when she looked this up this was used for depression. Patient was then confused by this and didn't understand the purposes behind this being ordered. Transcranial magnetic stimulation. I advised that I would need to discuss this further with Dr Brett Fairy because it could be that this helps with treatment of migraines in addtition to depression although main purpose may be for treatment of depression. Advised I would discuss and confirm that this was correct for the patient and would reach back to her to discuss. Pt verbalized understanding and was appreciative.

## 2019-04-13 NOTE — Telephone Encounter (Signed)
Pt is asking for a call from RN to  discuss information discuss in appointment with Dr

## 2019-04-14 ENCOUNTER — Encounter: Payer: Self-pay | Admitting: Neurology

## 2019-04-15 DIAGNOSIS — J301 Allergic rhinitis due to pollen: Secondary | ICD-10-CM | POA: Diagnosis not present

## 2019-04-15 DIAGNOSIS — J3089 Other allergic rhinitis: Secondary | ICD-10-CM | POA: Diagnosis not present

## 2019-04-16 DIAGNOSIS — J0110 Acute frontal sinusitis, unspecified: Secondary | ICD-10-CM | POA: Diagnosis not present

## 2019-04-16 DIAGNOSIS — R21 Rash and other nonspecific skin eruption: Secondary | ICD-10-CM | POA: Diagnosis not present

## 2019-04-16 DIAGNOSIS — J301 Allergic rhinitis due to pollen: Secondary | ICD-10-CM | POA: Diagnosis not present

## 2019-04-16 DIAGNOSIS — J3089 Other allergic rhinitis: Secondary | ICD-10-CM | POA: Diagnosis not present

## 2019-04-16 NOTE — Telephone Encounter (Signed)
I have asked Dr Jaynee Eagles to provide a headache specific consultation for the patient, also [place] referral placed upon Dr Jaynee Eagles recommendation/ suggestion . [name]   Can we get the patient in with Dr Jaynee Eagles soon?

## 2019-04-20 DIAGNOSIS — J3089 Other allergic rhinitis: Secondary | ICD-10-CM | POA: Diagnosis not present

## 2019-04-20 DIAGNOSIS — J301 Allergic rhinitis due to pollen: Secondary | ICD-10-CM | POA: Diagnosis not present

## 2019-04-26 NOTE — Progress Notes (Signed)
  Chief Complaint  Patient presents with  . Follow-up    Mild aortic valve insufficiency    History of Present Illness: [name] with history of SVT, premature atrial contractions, palpitations, vasovagal syncope, mild aortic valve insufficiency, mild mitral regurgitation,irritable bowel syndrome and GERD who is here today for cardiac follow up. She has had palpitations for years but no documented arrhythmias.Cardiac monitor April 2020 showed a short run of SVT. Exercise stress test 05/07/14 with good exercise tolerance, no ischemia. Echo 08/25/18 with LVEF=55-60%, mild AI, unchanged. 30 day event monitor March 2020 with sinus rhythm with PACs and PVCs. She wished to wear a second 30 day monitor in April 2020 and is this showed an episode of SVT.   She is here today for follow up. The patient denies any chest pain, palpitations, lower extremity edema, orthopnea, PND, dizziness, near syncope or syncope. 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 She has no exertional dyspnea otherwise. No weight gain.    Primary Care Physician: Plotnikov, Aleksei V, MD  Past Medical History:  Diagnosis Date  . Allergic rhinitis, cause unspecified   . Backache, unspecified   . Benign fasciculation-cramp syndrome 12/03/2012    Worsening with  stress, fatigue .  . Calculus of kidney   . Conversion disorder   . Cotton wool spots   . Esophageal reflux   . Floater, vitreous, left 05/2016  . GERD (gastroesophageal reflux disease)   . Hemorrhage of rectum and anus   . Internal hemorrhoids without mention of complication   . Irritable bowel syndrome   . Microscopic hematuria   . Migraine headache with aura   . Mild aortic insufficiency   . Mild tricuspid regurgitation   . Osteopenia   . Other plastic surgery for unacceptable cosmetic appearance    Inj tx fllers/expander  . Personal history of urinary calculi   . Postmenopausal atrophic vaginitis   . Premature atrial contractions   . PVC  (premature ventricular contraction)    a. Event monitor 2013.  . Unspecified constipation   . Vasovagal syncope     Past Surgical History:  Procedure Laterality Date  . BREAST BIOPSY Right   . Cosmetic Procedures w/Injection therapy    . SKIN CANCER EXCISION Left    thigh  . WISDOM TOOTH EXTRACTION      Current Outpatient Medications  Medication Sig Dispense Refill  . ALPRAZolam (XANAX) 0.25 MG tablet 0.5-1 po bid prn anxiety 30 tablet 1  . Botulinum Toxin Type A, Cosm, (BOTOX COSMETIC) 50 UNITS SOLR Inject 4-6 Units into the muscle. 3 month  injection    . Cholecalciferol (VITAMIN D3 PO) Take 50 mcg by mouth daily.    . conjugated estrogens (PREMARIN) vaginal cream Use PV once every 2-3 days 90 g 2  . Cyanocobalamin (VITAMIN B-12) 1000 MCG SUBL PLACE 1 TABLET (1,000 MCG TOTAL) UNDER THE TONGUE DAILY. 100 tablet 3  . dexlansoprazole (DEXILANT) 60 MG capsule Take 1 capsule (60 mg total) by mouth daily. 30 capsule 11  . EPIPEN 2-PAK 0.3 MG/0.3ML SOAJ injection See admin instructions. Reported on 10/08/2015  1  . flecainide (TAMBOCOR) 50 MG tablet Take 50 mg by mouth as needed (take 1 tablet as needed for fast heart rate).    . fluocinonide cream (LIDEX) 0.05 % Apply 1 application topically 2 (two) times daily. 90 g 2  . hyoscyamine (LEVSIN SL) 0.125 MG SL tablet Place 1 tablet (0.125 mg total) under the tongue every 4 (four) hours   as needed (for throat tightness). 30 tablet 3  . loratadine (CLARITIN) 10 MG tablet Take 1 tablet (10 mg total) by mouth daily. 30 tablet 11  . mometasone (NASONEX) 50 MCG/ACT nasal spray Place 2 sprays into the nose daily. 17 g 12  . Nerve Stimulator (CEFALY KIT) DEVI 1 Units by Does not apply route 2 (two) times daily. 1 application 1  . tretinoin (RETIN-A) 0.05 % cream Apply 1 application topically at bedtime.    . triamcinolone ointment (KENALOG) 0.5 % Apply 1 application topically 2 (two) times daily. 30 g 0  . urea (CARMOL) 40 % CREA 1 APPLICATION AS  NEEDED ONCE A DAY EXTERNALLY 30 DAYS     No current facility-administered medications for this visit.     Allergies  Allergen Reactions  . Codeine Other (See Comments)    Doesn't remeber  . Doxycycline Other (See Comments)  . Epinephrine     Heart racing   . Neomycin     Topical rash from cream  . Penicillins Hives and Other (See Comments)    Has patient had a PCN reaction causing immediate rash, facial/tongue/throat swelling, SOB or lightheadedness with hypotension: Yes Has patient had a PCN reaction causing severe rash involving mucus membranes or skin necrosis: Yes Has patient had a PCN reaction that required hospitalization No Has patient had a PCN reaction occurring within the last 10 years: No If all of the above answers are "NO", then may proceed with Cephalosporin use.   . Protonix [Pantoprazole Sodium]     ?HAs  . Sulfonamide Derivatives Other (See Comments)    Doesn't remember   . Zithromax [Azithromycin] Rash    Social History   Socioeconomic History  . Marital status: Married    Spouse name: Marc  . Number of children: 0  . Years of education: 14  . Highest education level: Not on file  Occupational History  . Occupation: OFFICE liasion    Employer: [employer]  . Occupation: Real Estate Developer  . 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 Financial resource strain: Not on file  . Food insecurity    Worry: Not on file    Inability: Not on file  . Transportation needs    Medical: Not on file    Non-medical: Not on file  Tobacco Use  . Smoking status: Never Smoker  . Smokeless tobacco: Never Used  Substance and Sexual Activity  . Alcohol use: No  . Drug use: No  . Sexual activity: Not on file  Lifestyle  . Physical activity    Days per week: Not on file    Minutes per session: Not on file  . Stress: Not on file  Relationships  . Social connections    Talks on phone: Not on file    Gets together:  Not on file    Attends religious service: Not on file    Active member of club or organization: Not on file    Attends meetings of clubs or organizations: Not on file    Relationship status: Not on file  . Intimate partner violence    Fear of current or ex partner: Not on file    Emotionally abused: Not on file    Physically abused: Not on file    Forced sexual activity: Not on file  Other Topics Concern  . Not on file  Social History Narrative   Vocational School in [place]. Married '90 -   marriage is in very good shape '12, No children.  Regular Exercise -  YES, body builder. [place] who are thinking of "snow birding" in [place]. Offerred medical services for them if needed (Nov '11)   Patient is married (Marc) and lives at home with her husband.   Patient is working part-time administrative work.   Patient has a high school education.   Patient is right-handed.   Patient does not drink any caffeine.       Family History  Problem Relation Age of Onset  . Hyperlipidemia Father   . COPD Father        Smoker, deceased 05/2014  . Hypertension Father   . Hyperlipidemia Mother   . Hypertension Mother   . Diabetes Neg Hx   . Colon cancer Neg Hx   . Breast cancer Neg Hx   . Coronary artery disease Neg Hx   . Esophageal cancer Neg Hx   . Stomach cancer Neg Hx   . Pancreatic cancer Neg Hx   . Liver disease Neg Hx     Review of Systems:  As stated in the HPI and otherwise negative.   BP (!) 144/78   Pulse 85   Ht 5' 2" (1.575 m)   Wt 103 lb (46.7 kg)   SpO2 99%   BMI 18.84 kg/m   Physical Examination:  General: Well developed, well nourished, NAD  HEENT: OP clear, mucus membranes moist  SKIN: warm, dry. No rashes. Neuro: No focal deficits  Musculoskeletal: Muscle strength 5/5 all ext  Psychiatric: Mood and affect normal  Neck: No JVD, no carotid bruits, no thyromegaly, no lymphadenopathy.  Lungs:Clear bilaterally, no wheezes, rhonci, crackles  Cardiovascular: Regular rate and rhythm. No murmurs, gallops or rubs. Abdomen:Soft. Bowel sounds present. Non-tender.  Extremities: No lower extremity edema. Pulses are 2 + in the bilateral DP/PT.  Echo March 2020: 1. The left ventricle has normal systolic function, with an ejection fraction of 55-60%. The cavity size was normal. Left ventricular diastolic parameters were normal.  2. The right ventricle has normal systolic function. The cavity was normal. There is no increase in right ventricular wall thickness.  3. The tricuspid valve is grossly normal.  4. The aortic valve is tricuspid Mild thickening of the aortic valve Aortic valve regurgitation is mild by color flow Doppler.  5. Normal LV function; sclerotic aortic valve with mild AI.  EKG:  EKG is ordered today. The ekg ordered today demonstrates   Recent Labs: 08/04/2018: Magnesium 2.1 01/13/2019: ALT 14; BUN 18; Creatinine, Ser 0.78; Hemoglobin 13.4; Platelets 190.0; Potassium 4.1; Sodium 140; TSH 2.01   Lipid Panel    Component Value Date/Time   CHOL 213 (H) 01/13/2019 0929   TRIG 113.0 01/13/2019 0929   TRIG [name] 06/25/2006 0741   HDL 73.10 01/13/2019 0929   CHOLHDL 3 01/13/2019 0929   VLDL 22.6 01/13/2019 0929   LDLCALC 117 (H) 01/13/2019 0929     Wt Readings from Last 3 Encounters:  04/27/19 103 lb (46.7 kg)  04/09/19 102 lb (46.3 kg)  01/13/19 101 lb (45.8 kg)     Other studies Reviewed: Additional studies/ records that were reviewed today include:  Review of the above records demonstrates:    Assessment and Plan:   1. Palpitations/PACs/PVCs/SVT: SVT noted on cardiac monitor in 2020. No palpitations. Overall feeling well. Continue flecainide as needed. Vagal maneuvers are reviewed today.     2. Aortic valve insufficiency: Mild by echo March 2020.   Repeat echo March 2022.   3. Carotid artery disease: Mild disease by u/s August 2019.   Current medicines are reviewed at length with the patient today.  The  patient does not have concerns regarding medicines.  The following changes have been made:  no change  Labs/ tests ordered today include:   No orders of the defined types were placed in this encounter.    Disposition:   FU with me in 12  months   Signed, Lauree Chandler, MD 04/27/2019 9:42 AM    [facility] [address], [place], [place]  [address] Phone: [phone]; Fax: [phone]

## 2019-04-27 ENCOUNTER — Encounter: Payer: Self-pay | Admitting: Cardiovascular Disease

## 2019-04-27 ENCOUNTER — Other Ambulatory Visit: Payer: Self-pay

## 2019-04-27 ENCOUNTER — Ambulatory Visit (INDEPENDENT_AMBULATORY_CARE_PROVIDER_SITE_OTHER): Payer: Medicare Other | Admitting: Cardiovascular Disease

## 2019-04-27 VITALS — BP 144/78 | HR 85 | Ht 62.0 in | Wt 103.0 lb

## 2019-04-27 DIAGNOSIS — J3089 Other allergic rhinitis: Secondary | ICD-10-CM | POA: Diagnosis not present

## 2019-04-27 DIAGNOSIS — I471 Supraventricular tachycardia, unspecified: Secondary | ICD-10-CM

## 2019-04-27 DIAGNOSIS — I351 Nonrheumatic aortic (valve) insufficiency: Secondary | ICD-10-CM

## 2019-04-27 DIAGNOSIS — I6523 Occlusion and stenosis of bilateral carotid arteries: Secondary | ICD-10-CM

## 2019-04-27 DIAGNOSIS — J301 Allergic rhinitis due to pollen: Secondary | ICD-10-CM | POA: Diagnosis not present

## 2019-04-27 NOTE — Patient Instructions (Addendum)

## 2019-05-04 DIAGNOSIS — J3089 Other allergic rhinitis: Secondary | ICD-10-CM | POA: Diagnosis not present

## 2019-05-04 DIAGNOSIS — J301 Allergic rhinitis due to pollen: Secondary | ICD-10-CM | POA: Diagnosis not present

## 2019-05-05 DIAGNOSIS — N952 Postmenopausal atrophic vaginitis: Secondary | ICD-10-CM

## 2019-05-06 ENCOUNTER — Ambulatory Visit: Payer: Medicare Other

## 2019-05-07 MED ORDER — ALPRAZOLAM 0.25 MG PO TABS: ORAL_TABLET | ORAL | 1 refills | Status: DC

## 2019-05-07 MED ORDER — PREMARIN 0.625 MG/GM VA CREA: TOPICAL_CREAM | VAGINAL | 2 refills | Status: DC

## 2019-05-09 ENCOUNTER — Ambulatory Visit (INDEPENDENT_AMBULATORY_CARE_PROVIDER_SITE_OTHER): Payer: Medicare Other | Admitting: Podiatry

## 2019-05-09 ENCOUNTER — Encounter: Payer: Self-pay | Admitting: Podiatry

## 2019-05-09 ENCOUNTER — Ambulatory Visit: Payer: Medicare Other

## 2019-05-09 ENCOUNTER — Other Ambulatory Visit: Payer: Self-pay

## 2019-05-09 DIAGNOSIS — I6523 Occlusion and stenosis of bilateral carotid arteries: Secondary | ICD-10-CM | POA: Diagnosis not present

## 2019-05-09 DIAGNOSIS — M21619 Bunion of unspecified foot: Secondary | ICD-10-CM | POA: Diagnosis not present

## 2019-05-09 DIAGNOSIS — M79675 Pain in left toe(s): Secondary | ICD-10-CM | POA: Diagnosis not present

## 2019-05-09 DIAGNOSIS — L600 Ingrowing nail: Secondary | ICD-10-CM | POA: Diagnosis not present

## 2019-05-09 NOTE — Progress Notes (Signed)
Subjective:   Patient ID: [name], female   DOB: 68 y.o.   MRN: [id number]   HPI Patient states the side of her nail has been bothering her and also she is concerned about her bunions and toes   ROS      Objective:  Physical Exam  Neurovascular status intact with patient found to have irritation of the right hallux medial side localized in nature and is found to have moderate bunion deformity bilateral with frontal plane rotation of the big toe bilateral     Assessment:  Gets chronic irritation to the nail medial side which I believe is due to the rotation of her toe along with structural bunion hammertoe deformity     Plan:  H&P reviewed all conditions explaining the relationship of the structure of the toe to the deformity and discomfort she experiences.  I do not think there is an ingrown toenail component currently.  I then went ahead and I discussed advanced bunion correction digital correction and we are can continue to hold off on that at the current time

## 2019-05-11 DIAGNOSIS — J3089 Other allergic rhinitis: Secondary | ICD-10-CM | POA: Diagnosis not present

## 2019-05-11 DIAGNOSIS — J301 Allergic rhinitis due to pollen: Secondary | ICD-10-CM | POA: Diagnosis not present

## 2019-05-13 ENCOUNTER — Telehealth: Payer: Self-pay | Admitting: Internal Medicine

## 2019-05-13 NOTE — Telephone Encounter (Signed)
Please advise 

## 2019-05-13 NOTE — Telephone Encounter (Signed)
Pharmacy states insurance will not cover conjugated estrogens (PREMARIN) vaginal cream but will cover estrace, requesting a new Rx  [facility] #[id number] [place], [place]

## 2019-05-16 MED ORDER — ESTRADIOL 0.1 MG/GM VA CREA: 1.0000 | TOPICAL_CREAM | VAGINAL | 11 refills | Status: DC

## 2019-05-16 NOTE — Telephone Encounter (Signed)
Okay.  Will switch to Estrace.  Thanks

## 2019-05-18 ENCOUNTER — Ambulatory Visit: Payer: Medicare Other | Admitting: Podiatry

## 2019-05-18 DIAGNOSIS — N952 Postmenopausal atrophic vaginitis: Secondary | ICD-10-CM

## 2019-05-18 DIAGNOSIS — J3089 Other allergic rhinitis: Secondary | ICD-10-CM | POA: Diagnosis not present

## 2019-05-18 DIAGNOSIS — J301 Allergic rhinitis due to pollen: Secondary | ICD-10-CM | POA: Diagnosis not present

## 2019-05-19 ENCOUNTER — Other Ambulatory Visit: Payer: Self-pay | Admitting: Internal Medicine

## 2019-05-19 DIAGNOSIS — N952 Postmenopausal atrophic vaginitis: Secondary | ICD-10-CM

## 2019-05-19 MED ORDER — PREMARIN 0.625 MG/GM VA CREA: TOPICAL_CREAM | VAGINAL | 2 refills | Status: DC

## 2019-05-25 ENCOUNTER — Other Ambulatory Visit: Payer: Self-pay

## 2019-05-25 ENCOUNTER — Ambulatory Visit (INDEPENDENT_AMBULATORY_CARE_PROVIDER_SITE_OTHER): Payer: Medicare Other

## 2019-05-25 DIAGNOSIS — Z23 Encounter for immunization: Secondary | ICD-10-CM

## 2019-05-25 DIAGNOSIS — Z299 Encounter for prophylactic measures, unspecified: Secondary | ICD-10-CM

## 2019-05-27 ENCOUNTER — Other Ambulatory Visit: Payer: Self-pay

## 2019-05-27 ENCOUNTER — Encounter: Payer: Self-pay | Admitting: Neurology

## 2019-05-27 ENCOUNTER — Ambulatory Visit (INDEPENDENT_AMBULATORY_CARE_PROVIDER_SITE_OTHER): Payer: Medicare Other | Admitting: Neurology

## 2019-05-27 VITALS — BP 143/86 | HR 77 | Temp 97.4°F | Ht 62.0 in | Wt 101.0 lb

## 2019-05-27 DIAGNOSIS — I6523 Occlusion and stenosis of bilateral carotid arteries: Secondary | ICD-10-CM

## 2019-05-27 DIAGNOSIS — G43009 Migraine without aura, not intractable, without status migrainosus: Secondary | ICD-10-CM | POA: Diagnosis not present

## 2019-05-27 DIAGNOSIS — G43109 Migraine with aura, not intractable, without status migrainosus: Secondary | ICD-10-CM | POA: Insufficient documentation

## 2019-05-27 DIAGNOSIS — M5481 Occipital neuralgia: Secondary | ICD-10-CM

## 2019-05-27 MED ORDER — RIZATRIPTAN BENZOATE 10 MG PO TBDP: 10.0000 mg | ORAL_TABLET | ORAL | 11 refills | Status: DC | PRN

## 2019-05-27 NOTE — Progress Notes (Signed)
[facility]    Provider:  Dr Jaynee Eagles Requesting Provider: Larey Seat, MD Primary Care Provider:  Plotnikov, [name], MD  CC:  migraines  HPI:  [name] is a 68 y.o. female here as requested by my colleague Dr. Jaynee Eagles for for headaches.  Past medical history migraine with and without aura, osteopenia, vasovagal syncope.  I reviewed her records: Patient has migraines with aura, sometimes the aura without the head pain, she can have visual restriction, she does follow with ophthalmology Dr. Carolynn Sayers, she was sent for carotid Doppler which was unremarkable.  Patient also has right occipital pain, also benign fasciculations.  She also has TMJ disorder. Tried: topirAmate, tizanidine, meloxicam. She has had migraine with aura all her life, she used to get sick and vomit but now she gets the aura mostly without head pain. However more recently she is having migraine with aura. She has 4 headache days a month. Motrin helps after several hours she may have to take multiple. Headaches are behind the eyes and may be behind the ears on the right. Dull ache, she has sound sensitivity, nausea, no vomiting but used to. Can last all day. The aura is on both eyes, zig zags, colors. 20-30 minutes and then may resolve to be followed by head pain. She does not get the aura very often, and it goes away on its own recently. She has had multiple images of her brain.  Review of Systems: Patient complains of symptoms per HPI as well as the following symptoms: migraine. Pertinent negatives and positives per HPI. All others negative.   Social History   Socioeconomic History   Marital status: Married    Spouse name: [name]   Number of children: 0   Years of education: 14   Highest education level: Not on file  Occupational History   Occupation: OFFICE liasion    Employer: [employer]   Occupation: Real Data processing manager   Occupation: OFFICE LE[employer]ON    Employer: [employer] resource strain: Not on file   Food insecurity    Worry: Not on file    Inability: Not on file   Transportation needs    Medical: Not on file    Non-medical: Not on file  Tobacco Use   Smoking status: Never Smoker   Smokeless tobacco: Never Used  Substance and Sexual Activity   Alcohol use: No   Drug use: No   Sexual activity: Not on file  Lifestyle   Physical activity    Days per week: Not on file    Minutes per session: Not on file   Stress: Not on file  Relationships   Social connections    Talks on phone: Not on file    Gets together: Not on file    Attends religious service: Not on file    Active member of club or organization: Not on file    Attends meetings of clubs or organizations: Not on file    Relationship status: Not on file   Intimate partner violence    Fear of current or ex partner: Not on file    Emotionally abused: Not on file    Physically abused: Not on file    Forced sexual activity: Not on file  Other Topics Concern   Not on file  Social History Narrative   Vocational School in [place]. Married '90 - marriage is in very good shape '12, No children.  Regular Exercise -  YES, Airline pilot. Has aging parents in the [place] states who are thinking of "snow birding" in [place]. Offerred medical services for them if needed (Nov '11)   Patient is married [name]) and lives at home with her husband.   Patient is working Administrator, arts work.   Patient has a high school education.   Patient is right-handed.   Patient does not drink any caffeine.       Family History  Problem Relation Age of Onset   Hyperlipidemia Father    COPD Father        Smoker, deceased 05/30/2014   Hypertension Father    Hyperlipidemia Mother    Hypertension Mother    Headache Sister    Diabetes Neg Hx    Colon cancer Neg Hx    Breast cancer Neg Hx    Coronary artery disease Neg Hx    Esophageal cancer Neg Hx     Stomach cancer Neg Hx    Pancreatic cancer Neg Hx    Liver disease Neg Hx     Past Medical History:  Diagnosis Date   Allergic rhinitis, cause unspecified    Backache, unspecified    Benign fasciculation-cramp syndrome 12/03/2012    Worsening with  stress, fatigue .   Calculus of kidney    Conversion disorder    Cotton wool spots    Esophageal reflux    Floater, vitreous, left 2016-05-30   GERD (gastroesophageal reflux disease)    Hemorrhage of rectum and anus    Internal hemorrhoids without mention of complication    Irritable bowel syndrome    Microscopic hematuria    Migraine headache with aura    Mild aortic insufficiency    Mild tricuspid regurgitation    Osteopenia    Other plastic surgery for unacceptable cosmetic appearance    Inj tx fllers/expander   Personal history of urinary calculi    Postmenopausal atrophic vaginitis    Premature atrial contractions    PVC (premature ventricular contraction)    a. Event monitor 2013.   Unspecified constipation    Vasovagal syncope     Patient Active Problem List   Diagnosis Date Noted   Migraine without aura and without status migrainosus, not intractable 05/27/2019   Migraine aura without headache 05/27/2019   Myoneural disorder, unspecified ([place]) 04/09/2019   Sore throat 06/13/2018   Friction injury to skin 04/14/2018   Occipital neuralgia 03/03/2018   B12 deficiency 03/03/2018   Ocular migraine 02/05/2018   Leg wound, right 12/26/2017   Bilateral impacted cerumen 10/21/2017   LLQ abdominal pain 09/23/2017   Elevated antinuclear antibody (ANA) level 07/26/2017   Arthralgia 07/26/2017   Degenerative disc disease, lumbar 07/04/2017   Cough 06/06/2017   Chest discomfort 03/21/2017   Neck mass 01/17/2017   Mass of neck 12/25/2016   Snoring 12/18/2016   Medial tibial stress syndrome, left, initial encounter 08/14/2016   Patellofemoral syndrome of both knees 08/09/2016    Drug allergy, antibiotic 05/31/2016   Hyperkalemia 02/17/2016   Vertical diplopia 02/15/2016   Tension headache 12/28/2015   BPPV (benign paroxysmal positional vertigo) 69/67/8938   TMJ click 04/03/5101   Cold sore 07/18/2015   Osteopenia 07/18/2015   Earache on left 07/18/2015   Cervical disc disorder with radiculopathy of cervical region 06/17/2015   Cotton wool spots 03/15/2015   Left LBP 03/08/2015   Food poisoning 03/08/2015   Generalized anxiety disorder 03/08/2015   Dysuria 02/23/2015   Well adult exam 09/09/2014   Internal hemorrhoids 09/07/2014  Allergic urticaria 08/20/2014   Acute sinusitis 08/18/2014   Neck muscle spasm 07/30/2014   Grief 06/17/2014   Mild aortic insufficiency    Mild tricuspid regurgitation    Vasovagal syncope    Premature atrial contractions    GERD (gastroesophageal reflux disease)    PVC (premature ventricular contraction)    Dense breast tissue 03/23/2014   Microhematuria 03/22/2014   Changing skin lesion 02/05/2014   Rash and nonspecific skin eruption 12/22/2013   Chronic meniscal tear of knee 12/21/2013   Benign fasciculations 12/11/2013   Strain of adductor magnus muscle of left lower extremity 05/21/2013   Benign fasciculation-cramp syndrome 12/03/2012   Vasovagal near syncope 11/02/2012   Migraine with aura 11/02/2012   Upper airway cough syndrome 08/16/2012   Chest tightness 09/21/2011   Bunion of great toe 09/11/2011   Routine health maintenance 09/11/2011   Mild mitral regurgitation by prior echocardiogram 05/15/2011   Chronic neck pain 03/07/2011   Skin lesion 03/07/2011   DYSPLASTIC NEVUS, FACE 02/06/2010   HAND PAIN, BILATERAL 08/25/2009   Aortic valve disorder 08/02/2009   Abnormal involuntary movements(781.0) 07/28/2009   LUMBAR SPRAIN AND STRAIN 05/21/2009   Palpitations 02/03/2009   HEMORRHOIDS-INTERNAL 12/15/2008   Constipation 12/15/2008   Irritable bowel  syndrome 12/15/2008   RECTAL BLEEDING 12/15/2008   RENAL CALCULUS, HX OF 11/29/2007   Herpes simplex without mention of complication 76/73/4193   Vaginal atrophy 03/25/2007   GLOBUS HYSTERICUS 01/01/2007   RHINITIS, ALLERGIC NOS 01/01/2007    Past Surgical History:  Procedure Laterality Date   BREAST BIOPSY Right    Cosmetic Procedures w/Injection therapy     SKIN CANCER EXCISION Left    thigh   WISDOM TOOTH EXTRACTION      Current Outpatient Medications  Medication Sig Dispense Refill   ALPRAZolam (XANAX) 0.25 MG tablet 0.5-1 po bid prn anxiety 30 tablet 1   Botulinum Toxin Type A, Cosm, (BOTOX COSMETIC) 50 UNITS SOLR Inject 4-6 Units into the muscle. 3 month  injection     cefdinir (OMNICEF) 300 MG capsule Take 300 mg by mouth 2 (two) times daily.     Cholecalciferol (VITAMIN D3 PO) Take 50 mcg by mouth daily.     conjugated estrogens (PREMARIN) vaginal cream Use PV once every 2-3 days 90 g 2   Cyanocobalamin (VITAMIN B-12) 1000 MCG SUBL PLACE 1 TABLET (1,000 MCG TOTAL) UNDER THE TONGUE DAILY. 100 tablet 3   dexlansoprazole (DEXILANT) 60 MG capsule Take 1 capsule (60 mg total) by mouth daily. 30 capsule 11   EPIPEN 2-PAK 0.3 MG/0.3ML SOAJ injection See admin instructions. Reported on 10/08/2015  1   flecainide (TAMBOCOR) 50 MG tablet Take 50 mg by mouth as needed (take 1 tablet as needed for fast heart rate).     fluocinonide cream (LIDEX) 7.90 % Apply 1 application topically 2 (two) times daily. 90 g 2   hyoscyamine (LEVSIN SL) 0.125 MG SL tablet Place 1 tablet (0.125 mg total) under the tongue every 4 (four) hours as needed (for throat tightness). 30 tablet 3   loratadine (CLARITIN) 10 MG tablet Take 1 tablet (10 mg total) by mouth daily. 30 tablet 11   mometasone (NASONEX) 50 MCG/ACT nasal spray Place 2 sprays into the nose daily. 17 g 12   Nerve Stimulator (CEFALY KIT) DEVI 1 Units by Does not apply route 2 (two) times daily. 1 application 1   tretinoin  (RETIN-A) 0.05 % cream Apply 1 application topically at bedtime.     triamcinolone ointment (  KENALOG) 0.5 % Apply 1 application topically 2 (two) times daily. 30 g 0   UNABLE TO FIND once a week. Med Name: allergy shots     urea (CARMOL) 40 % CREA 1 APPLICATION AS NEEDED ONCE A DAY EXTERNALLY 30 DAYS     rizatriptan (MAXALT-MLT) 10 MG disintegrating tablet Take 1 tablet (10 mg total) by mouth as needed for migraine. May repeat in 2 hours if needed 9 tablet 11   No current facility-administered medications for this visit.     Allergies as of 05/27/2019 - Review Complete 05/27/2019  Allergen Reaction Noted   Codeine Other (See Comments)    Doxycycline Other (See Comments) 05/31/2016   Epinephrine  08/12/2012   Neomycin  03/08/2015   Penicillins Hives and Other (See Comments)    Protonix [pantoprazole sodium]  06/06/2017   Sulfonamide derivatives Other (See Comments)    Zithromax [azithromycin] Rash 08/18/2014    Vitals: BP (!) 143/86 (BP Location: Left Arm, Patient Position: Sitting)    Pulse 77    Temp (!) 97.4 F (36.3 C) Comment: taken at front   Ht '5\' 2"'$  (1.575 m)    Wt 101 lb (45.8 kg)    BMI 18.47 kg/m  Last Weight:  Wt Readings from Last 1 Encounters:  05/27/19 101 lb (45.8 kg)   Last Height:   Ht Readings from Last 1 Encounters:  05/27/19 '5\' 2"'$  (1.575 m)     Physical exam: Exam: Gen: NAD, conversant, well nourised, well groomed                     Eyes: Conjunctivae clear without exudates or hemorrhage  Neuro: Detailed Neurologic Exam  Speech:    Speech is normal; fluent and spontaneous with normal comprehension.  Cognition:    The patient is oriented to person, place, and time;     recent and remote memory intact;     language fluent;     normal attention, concentration,     fund of knowledge Cranial Nerves:    The pupils are equal, round, and reactive to light.  Visual fields are full to finger confrontation. Extraocular movements are intact.  Trigeminal sensation is intact and the muscles of mastication are normal. The face is symmetric. The palate elevates in the midline. Hearing intact. Voice is normal. Shoulder shrug is normal. The tongue has normal motion without fasciculations.   Coordination:    No dysmetria  Gait:    Normal gait  Motor Observation:    No asymmetry, no atrophy, and no involuntary movements noted. Tone:    Normal muscle tone.    Posture:    Posture is normal. normal erect    Strength:    Strength is V/V in the upper and lower limbs.        Assessment/Plan:  68 year old with migraines with and without contrast.   Renelda Mom Takacs: Left-sided occipital neuralgia and migraines, dry needling for cervical myofascial pain.  Acute management: Maxalt  Try magnesium for aura  Orders Placed This Encounter  Procedures   Ambulatory referral to Physical Therapy    Meds ordered this encounter  Medications   rizatriptan (MAXALT-MLT) 10 MG disintegrating tablet    Sig: Take 1 tablet (10 mg total) by mouth as needed for migraine. May repeat in 2 hours if needed    Dispense:  9 tablet    Refill:  11    Cc: Plotnikov, [name], MD,  Asencion Partridge Dohmeier MD  Sarina Ill,  MD  [facility] [address] [place], [place] [address]  Phone [phone] Fax [phone]  A total of 40 minutes was spent face-to-face with this patient. Over half this time was spent on counseling patient on the  1. Migraine without aura and without status migrainosus, not intractable   2. Occipital neuralgia of left side   3. Migraine with aura and without status migrainosus, not intractable   4. Migraine aura without headache    diagnosis and different diagnostic and therapeutic options, counseling and coordination of care, risks ans benefits of management, compliance, or risk factor reduction and education.

## 2019-05-27 NOTE — Patient Instructions (Addendum)
Acute management: Maxalt (Rizatriptan): Please take one tablet at the onset of your headache. If it does not improve the symptoms please take one additional tablet. Do not take more then 2 tablets in 24hrs. Do not take use more then 2 to 3 times in a week. May take with ibuprofen.  Dry needling: [place] : [name]: Left-sided occipital neuralgia and migraines, dry needling for cervical myofascial pain  Magnesium: Magnesium citrate 400mg  to 600mg  daily. Also: riboflavin 400mg  daily, Coenzyme Q 10 100mg  three times daily OR Migrelief (on [employer])- Magnesium/ Vitamin B2 and feverfew  Consider Botox/CGRP ([place])   Rizatriptan disintegrating tablets What is this medicine? RIZATRIPTAN (rye za TRIP tan) is used to treat migraines with or without aura. An aura is a strange feeling or visual disturbance that warns you of an attack. It is not used to prevent migraines. This medicine may be used for other purposes; ask your health care provider or pharmacist if you have questions. COMMON BRAND NAME(S): Maxalt-MLT What should I tell my health care provider before I take this medicine? They need to know if you have any of these conditions:  cigarette smoker  circulation problems in fingers and toes  diabetes  heart disease  high blood pressure  high cholesterol  history of irregular heartbeat  history of stroke  kidney disease  liver disease  stomach or intestine problems  an unusual or allergic reaction to rizatriptan, other medicines, foods, dyes, or preservatives  pregnant or trying to get pregnant  breast-feeding How should I use this medicine? Take this medicine by mouth. Follow the directions on the prescription label. Leave the tablet in the sealed blister pack until you are ready to take it. With dry hands, open the blister and gently remove the tablet. If the tablet breaks or crumbles, throw it away and take a new tablet out of the blister pack. Place the  tablet in the mouth and allow it to dissolve, and then swallow. Do not cut, crush, or chew this medicine. You do not need water to take this medicine. Do not take it more often than directed. Talk to your pediatrician regarding the use of this medicine in children. While this drug may be prescribed for children as young as 6 years for selected conditions, precautions do apply. Overdosage: If you think you have taken too much of this medicine contact a poison control center or emergency room at once. NOTE: This medicine is only for you. Do not share this medicine with others. What if I miss a dose? This does not apply. This medicine is not for regular use. What may interact with this medicine? Do not take this medicine with any of the following medicines:  certain medicines for migraine headache like almotriptan, eletriptan, frovatriptan, naratriptan, rizatriptan, sumatriptan, zolmitriptan  ergot alkaloids like dihydroergotamine, ergonovine, ergotamine, methylergonovine  MAOIs like Carbex, Eldepryl, Marplan, Nardil, and Parnate This medicine may also interact with the following medications:  certain medicines for depression, anxiety, or psychotic disorders  propranolol This list may not describe all possible interactions. Give your health care provider a list of all the medicines, herbs, non-prescription drugs, or dietary supplements you use. Also tell them if you smoke, drink alcohol, or use illegal drugs. Some items may interact with your medicine. What should I watch for while using this medicine? Visit your healthcare professional for regular checks on your progress. Tell your healthcare professional if your symptoms do not start to get better or if they get worse. You may  get drowsy or dizzy. Do not drive, use machinery, or do anything that needs mental alertness until you know how this medicine affects you. Do not stand up or sit up quickly, especially if you are an older patient. This  reduces the risk of dizzy or fainting spells. Alcohol may interfere with the effect of this medicine. Your mouth may get dry. Chewing sugarless gum or sucking hard candy and drinking plenty of water may help. Contact your healthcare professional if the problem does not go away or is severe. If you take migraine medicines for 10 or more days a month, your migraines may get worse. Keep a diary of headache days and medicine use. Contact your healthcare professional if your migraine attacks occur more frequently. What side effects may I notice from receiving this medicine? Side effects that you should report to your doctor or health care professional as soon as possible:  allergic reactions like skin rash, itching or hives, swelling of the face, lips, or tongue  chest pain or chest tightness  signs and symptoms of a dangerous change in heartbeat or heart rhythm like chest pain; dizziness; fast, irregular heartbeat; palpitations; feeling faint or lightheaded; falls; breathing problems  signs and symptoms of a stroke like changes in vision; confusion; trouble speaking or understanding; severe headaches; sudden numbness or weakness of the face, arm or leg; trouble walking; dizziness; loss of balance or coordination  signs and symptoms of serotonin syndrome like irritable; confusion; diarrhea; fast or irregular heartbeat; muscle twitching; stiff muscles; trouble walking; sweating; high fever; seizures; chills; vomiting Side effects that usually do not require medical attention (report to your doctor or health care professional if they continue or are bothersome):  diarrhea  dizziness  drowsiness  dry mouth  headache  nausea, vomiting  pain, tingling, numbness in the hands or feet  stomach pain This list may not describe all possible side effects. Call your doctor for medical advice about side effects. You may report side effects to FDA at 1-800-FDA-1088. Where should I keep my  medicine? Keep out of the reach of children. Store at room temperature between 15 and 30 degrees C (59 and 86 degrees F). Protect from light and moisture. Throw away any unused medicine after the expiration date. NOTE: This sheet is a summary. It may not cover all possible information. If you have questions about this medicine, talk to your doctor, pharmacist, or health care provider.  2020 Elsevier/Gold Standard (2017-12-17 14:58:08)

## 2019-06-03 ENCOUNTER — Other Ambulatory Visit: Payer: Self-pay | Admitting: Physician Assistant

## 2019-06-17 ENCOUNTER — Encounter: Payer: Self-pay | Admitting: Family Medicine

## 2019-06-17 ENCOUNTER — Ambulatory Visit (INDEPENDENT_AMBULATORY_CARE_PROVIDER_SITE_OTHER): Payer: Medicare Other | Admitting: Family Medicine

## 2019-06-17 ENCOUNTER — Other Ambulatory Visit: Payer: Self-pay

## 2019-06-17 DIAGNOSIS — M5481 Occipital neuralgia: Secondary | ICD-10-CM | POA: Diagnosis not present

## 2019-06-17 DIAGNOSIS — I6523 Occlusion and stenosis of bilateral carotid arteries: Secondary | ICD-10-CM

## 2019-06-17 NOTE — Patient Instructions (Addendum)
Keep hands within peripheral vision 2 tennis balls Exercise 3 times a week Coop pillow See me again in 6 weeks if not better

## 2019-06-17 NOTE — Assessment & Plan Note (Signed)
Patient has had difficulty before.  Seems to cause more of a neck muscle spasm.  Do not cause any significant difference in treatment options at the moment.  Discussed icing regimen and home exercises, patient is a avid athletic enthusiast who does workout on a regular basis.  Patient is encouraged to continue to do so but keep hands within peripheral vision.  Encouraged what activities to potentially avoid.  Follow-up with me again in 6 to 8 weeks.  Spent  35 minutes with patient face-to-face and had greater than 50% of counseling including as described above in assessment and plan.

## 2019-06-17 NOTE — Progress Notes (Signed)
[facility] [place] Phone: [phone] Subjective:   [name], am serving as a scribe for Dr. Hulan Saas. This visit occurred during the SARS-CoV-2 public health emergency.  Safety protocols were in place, including screening questions prior to the visit, additional usage of staff PPE, and extensive cleaning of exam room while observing appropriate contact time as indicated for disinfecting solutions.      CC: Neck pain  [id number]   03/13/2018 Patient does have known degenerative disc disease.  Concern the patient is having exacerbation.  Trigger points noted in the right trapezius area as well.  Patient declined any type of injection.  Home exercises given, patient has had laboratory work-up without any significant findings.  Repeat x-rays since patient had x-rays 3 years ago.  Follow-up again in 4 to 8 weeks  Update 06/17/2019 [name] is a 68 y.o. female coming in with complaint of neck pain. Patient states she still has issues with her neck. Pain at night. Working with a Engineer, production. Just wants to know what she can and can't do. Occipital nerve issue. States she was reaching one day and felt the pain. Still has pain at times with certain movements.  Patient is remained somewhat active.  Starting to workout again on a more regular basis though.  Concerned that if anything starts to have increasing discomfort it stops her from working out.  Patient denies any radiation down the arm.  Denies any numbness or tingling.    Past Medical History:  Diagnosis Date  . Allergic rhinitis, cause unspecified   . Backache, unspecified   . Benign fasciculation-cramp syndrome 12/03/2012    Worsening with  stress, fatigue .  [name] Calculus of kidney   . Conversion disorder   . Cotton wool spots   . Esophageal reflux   . Floater, vitreous, left 05/2016  . GERD (gastroesophageal reflux disease)   . Hemorrhage of rectum  and anus   . Internal hemorrhoids without mention of complication   . Irritable bowel syndrome   . Microscopic hematuria   . Migraine headache with aura   . Mild aortic insufficiency   . Mild tricuspid regurgitation   . Osteopenia   . Other plastic surgery for unacceptable cosmetic appearance    Inj tx fllers/expander  . Personal history of urinary calculi   . Postmenopausal atrophic vaginitis   . Premature atrial contractions   . PVC (premature ventricular contraction)    a. Event monitor 2013.  [name] Unspecified constipation   . Vasovagal syncope    Past Surgical History:  Procedure Laterality Date  . BREAST BIOPSY Right   . Cosmetic Procedures w/Injection therapy    . SKIN CANCER EXCISION Left    thigh  . WISDOM TOOTH EXTRACTION     Social History   Socioeconomic History  . Marital status: Married    Spouse name: [name]  . Number of children: 0  . Years of education: 51  . Highest education level: Not on file  Occupational History  . Occupation: OFFICE liasion    Employer: [employer]  . Occupation: Engineer, structural  . Occupation: OFFICE LEAZON    Employer: [employer]  Tobacco Use  . Smoking status: Never Smoker  . Smokeless tobacco: Never Used  Substance and Sexual Activity  . Alcohol use: No  . Drug use: No  . Sexual activity: Not on file  Other Topics Concern  . 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 Married '90 - marriage is in very good shape '12, No children.  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 Offerred medical services for them if needed (Nov '11)   Patient is married [name]) and lives at home with her husband.   Patient is working Administrator, arts work.   Patient has a high school education.   Patient is right-handed.   Patient does not drink any caffeine.      Social Determinants of Health   Financial Resource  Strain:   . Difficulty of Paying Living Expenses: Not on file  Food Insecurity:   . Worried About Charity fundraiser in the Last Year: Not on file  . Ran Out of Food in the Last Year: Not on file  Transportation Needs:   . Lack of Transportation (Medical): Not on file  . Lack of Transportation (Non-Medical): Not on file  Physical Activity:   . Days of Exercise per Week: Not on file  . Minutes of Exercise per Session: Not on file  Stress:   . Feeling of Stress : Not on file  Social Connections:   . Frequency of Communication with Friends and Family: Not on file  . Frequency of Social Gatherings with Friends and Family: Not on file  . Attends Religious Services: Not on file  . Active Member of Clubs or Organizations: Not on file  . Attends Archivist Meetings: Not on file  . Marital Status: Not on file   Allergies  Allergen Reactions  . Codeine Other (See Comments)    Doesn't remeber  . Doxycycline Other (See Comments)  . Epinephrine     Heart racing   . Neomycin     Topical rash from cream  . Penicillins Hives and Other (See Comments)    Has patient had a PCN reaction causing immediate rash, facial/tongue/throat swelling, SOB or lightheadedness with hypotension: Yes Has patient had a PCN reaction causing severe rash involving mucus membranes or skin necrosis: Yes Has patient had a PCN reaction that required hospitalization No Has patient had a PCN reaction occurring within the last 10 years: No If all of the above answers are "NO", then may proceed with Cephalosporin use.   . Protonix [Pantoprazole Sodium]     ?HAs  . Sulfonamide Derivatives Other (See Comments)    Doesn't remember   . Zithromax [Azithromycin] Rash   Family History  Problem Relation Age of Onset  . Hyperlipidemia Father   . COPD Father        Smoker, deceased June 17, 2014  . Hypertension Father   . Hyperlipidemia Mother   . Hypertension Mother   . Headache Sister   . Diabetes Neg Hx   . Colon  cancer Neg Hx   . Breast cancer Neg Hx   . Coronary artery disease Neg Hx   . Esophageal cancer Neg Hx   . Stomach cancer Neg Hx   . Pancreatic cancer Neg Hx   . Liver disease Neg Hx      Current Outpatient Medications (Cardiovascular):  [name]  EPIPEN 2-PAK 0.3 MG/0.3ML SOAJ injection, See admin instructions. Reported on 10/08/2015 .  flecainide (TAMBOCOR) 50 MG tablet, Take 50 mg by mouth as needed (take 1 tablet as needed for fast heart rate).  Current Outpatient Medications (Respiratory):  .  loratadine (CLARITIN) 10 MG tablet, Take 1 tablet (10 mg  total) by mouth daily. .  mometasone (NASONEX) 50 MCG/ACT nasal spray, Place 2 sprays into the nose daily.  Current Outpatient Medications (Analgesics):  .  rizatriptan (MAXALT-MLT) 10 MG disintegrating tablet, Take 1 tablet (10 mg total) by mouth as needed for migraine. May repeat in 2 hours if needed  Current Outpatient Medications (Hematological):  [name]  Cyanocobalamin (VITAMIN B-12) 1000 MCG SUBL, PLACE 1 TABLET (1,000 MCG TOTAL) UNDER THE TONGUE DAILY.  Current Outpatient Medications (Other):  [name]  ALPRAZolam (XANAX) 0.25 MG tablet, 0.5-1 po bid prn anxiety .  Botulinum Toxin Type A, Cosm, (BOTOX COSMETIC) 50 UNITS SOLR, Inject 4-6 Units into the muscle. 3 month  injection .  cefdinir (OMNICEF) 300 MG capsule, Take 300 mg by mouth 2 (two) times daily. .  Cholecalciferol (VITAMIN D3 PO), Take 50 mcg by mouth daily. [name]  conjugated estrogens (PREMARIN) vaginal cream, Use PV once every 2-3 days .  DEXILANT 60 MG capsule, TAKE 1 CAPSULE BY MOUTH EVERY DAY .  fluocinonide cream (LIDEX) 9.52 %, Apply 1 application topically 2 (two) times daily. .  hyoscyamine (LEVSIN SL) 0.125 MG SL tablet, Place 1 tablet (0.125 mg total) under the tongue every 4 (four) hours as needed (for throat tightness). .  Nerve Stimulator (CEFALY KIT) DEVI, 1 Units by Does not apply route 2 (two) times daily. [name]  tretinoin (RETIN-A) 0.05 % cream, Apply 1 application topically  at bedtime. .  triamcinolone ointment (KENALOG) 0.5 %, Apply 1 application topically 2 (two) times daily. [name]  UNABLE TO FIND, once a week. Med Name: allergy shots .  urea (CARMOL) 40 % CREA, 1 APPLICATION AS NEEDED ONCE A DAY EXTERNALLY 30 DAYS    Past medical history, social, surgical and family history all reviewed in electronic medical record.  No pertanent information unless stated regarding to the chief complaint.   Review of Systems:  No headache, visual changes, nausea, vomiting, diarrhea, constipation, dizziness, abdominal pain, skin rash, fevers, chills, night sweats, weight loss, swollen lymph nodes, body aches, joint swelling, muscle aches, chest pain, shortness of breath, mood changes.   Objective  Blood pressure 120/86, pulse 78, height '5\' 2"'$  (1.575 m), weight 102 lb (46.3 kg), SpO2 98 %.    General: No apparent distress alert and oriented x3 mood and affect normal, dressed appropriately.  HEENT: Pupils equal, extraocular movements intact  Respiratory: Patient's speak in full sentences and does not appear short of breath  Cardiovascular: No lower extremity edema, non tender, no erythema  Skin: Warm dry intact with no signs of infection or rash on extremities or on axial skeleton.  Abdomen: Soft nontender  Neuro: Cranial nerves II through XII are intact, neurovascularly intact in all extremities with 2+ DTRs and 2+ pulses.  Lymph: No lymphadenopathy of posterior or anterior cervical chain or axillae bilaterally.  Gait normal with good balance and coordination.  MSK:  Non tender with full range of motion and good stability and symmetric strength and tone of shoulders, elbows, wrist, hip, knee and ankles bilaterally.  Neck exam does show the patient has some mild loss of lordosis.  Some very mild crepitus noted with range of motion testing.  Negative Spurling's.  Some tightness noted in the parascapular region right greater than left.   Impression and Recommendations:     This  case required medical decision making of moderate complexity. The above documentation has been reviewed and is accurate and complete Lyndal Pulley, DO       Note: This dictation was  prepared with Dragon dictation along with smaller phrase technology. Any transcriptional errors that result from this process are unintentional.

## 2019-06-20 ENCOUNTER — Other Ambulatory Visit: Payer: Self-pay | Admitting: Internal Medicine

## 2019-06-20 MED ORDER — FAMOTIDINE 40 MG PO TABS: 40.0000 mg | ORAL_TABLET | Freq: Every day | ORAL | 3 refills | Status: DC

## 2019-06-21 ENCOUNTER — Other Ambulatory Visit: Payer: Self-pay | Admitting: Neurology

## 2019-06-23 ENCOUNTER — Telehealth: Payer: Self-pay | Admitting: Family Medicine

## 2019-06-23 NOTE — Telephone Encounter (Signed)
Talked to patient about exercises and stretches. Patient voices understanding.

## 2019-06-23 NOTE — Telephone Encounter (Signed)
Patient called with questions regarding the stretches that she was given at her last appointment.  Can you help her please?

## 2019-06-25 ENCOUNTER — Other Ambulatory Visit: Payer: Self-pay | Admitting: Internal Medicine

## 2019-07-02 DIAGNOSIS — J301 Allergic rhinitis due to pollen: Secondary | ICD-10-CM | POA: Diagnosis not present

## 2019-07-02 DIAGNOSIS — J3089 Other allergic rhinitis: Secondary | ICD-10-CM | POA: Diagnosis not present

## 2019-07-03 ENCOUNTER — Other Ambulatory Visit: Payer: Self-pay | Admitting: Internal Medicine

## 2019-07-06 DIAGNOSIS — J301 Allergic rhinitis due to pollen: Secondary | ICD-10-CM | POA: Diagnosis not present

## 2019-07-06 DIAGNOSIS — J3089 Other allergic rhinitis: Secondary | ICD-10-CM | POA: Diagnosis not present

## 2019-07-17 ENCOUNTER — Ambulatory Visit: Payer: Medicare Other

## 2019-07-20 ENCOUNTER — Other Ambulatory Visit: Payer: Self-pay

## 2019-07-20 ENCOUNTER — Encounter: Payer: Self-pay | Admitting: Internal Medicine

## 2019-07-20 ENCOUNTER — Telehealth: Payer: Self-pay | Admitting: Internal Medicine

## 2019-07-20 ENCOUNTER — Ambulatory Visit (INDEPENDENT_AMBULATORY_CARE_PROVIDER_SITE_OTHER): Payer: Medicare Other | Admitting: Internal Medicine

## 2019-07-20 DIAGNOSIS — F411 Generalized anxiety disorder: Secondary | ICD-10-CM | POA: Diagnosis not present

## 2019-07-20 DIAGNOSIS — G8929 Other chronic pain: Secondary | ICD-10-CM

## 2019-07-20 DIAGNOSIS — T50Z95A Adverse effect of other vaccines and biological substances, initial encounter: Secondary | ICD-10-CM | POA: Diagnosis not present

## 2019-07-20 DIAGNOSIS — G4459 Other complicated headache syndrome: Secondary | ICD-10-CM

## 2019-07-20 DIAGNOSIS — E538 Deficiency of other specified B group vitamins: Secondary | ICD-10-CM

## 2019-07-20 DIAGNOSIS — M542 Cervicalgia: Secondary | ICD-10-CM

## 2019-07-20 DIAGNOSIS — G44209 Tension-type headache, unspecified, not intractable: Secondary | ICD-10-CM | POA: Diagnosis not present

## 2019-07-20 DIAGNOSIS — T50B95A Adverse effect of other viral vaccines, initial encounter: Secondary | ICD-10-CM | POA: Insufficient documentation

## 2019-07-20 MED ORDER — LORATADINE 10 MG PO TABS: 10.0000 mg | ORAL_TABLET | Freq: Every day | ORAL | 3 refills | Status: DC

## 2019-07-20 MED ORDER — DEXILANT 60 MG PO CPDR: 60.0000 mg | DELAYED_RELEASE_CAPSULE | Freq: Every day | ORAL | 3 refills | Status: DC

## 2019-07-20 NOTE — Assessment & Plan Note (Signed)
OK to premedicate w/Ibuprofen and Claritin for her 2nd shot

## 2019-07-20 NOTE — Progress Notes (Signed)
Subjective:  Patient ID: [name], female    DOB: September 17, 1950  Age: 69 y.o. MRN: [id number]  CC: No chief complaint on file.   HPI [name] presents for SVT on Fri 2 hrs after the 1st Diablo shot - HR 200 on pulse ox at home. She called Dr Julianne Handler. On Sat - pt felt nauseated, chills, arm pain and redness F/u allergies, HAs , anxiety C/o neck pain, HA - worse since her head injury in the garage (Oct 2020) Outpatient Medications Prior to Visit  Medication Sig Dispense Refill  . ALPRAZolam (XANAX) 0.25 MG tablet 0.5-1 po bid prn anxiety 30 tablet 1  . Botulinum Toxin Type A, Cosm, (BOTOX COSMETIC) 50 UNITS SOLR Inject 4-6 Units into the muscle. 3 month  injection    . cefdinir (OMNICEF) 300 MG capsule Take 300 mg by mouth 2 (two) times daily.    . Cholecalciferol (VITAMIN D3 PO) Take 50 mcg by mouth daily.    [name] conjugated estrogens (PREMARIN) vaginal cream Use PV once every 2-3 days 90 g 2  . Cyanocobalamin (VITAMIN B-12) 1000 MCG SUBL PLACE 1 TABLET (1,000 MCG TOTAL) UNDER THE TONGUE DAILY. 100 tablet 3  . DEXILANT 60 MG capsule TAKE 1 CAPSULE BY MOUTH EVERY DAY 30 capsule 0  . EPIPEN 2-PAK 0.3 MG/0.3ML SOAJ injection See admin instructions. Reported on 10/08/2015  1  . famotidine (PEPCID) 40 MG tablet Take 1 tablet (40 mg total) by mouth daily. 90 tablet 3  . flecainide (TAMBOCOR) 50 MG tablet Take 50 mg by mouth as needed (take 1 tablet as needed for fast heart rate).    . fluocinonide cream (LIDEX) 9.29 % Apply 1 application topically 2 (two) times daily. 90 g 2  . hyoscyamine (LEVSIN SL) 0.125 MG SL tablet Place 1 tablet (0.125 mg total) under the tongue every 4 (four) hours as needed (for throat tightness). 30 tablet 3  . loratadine (CLARITIN) 10 MG tablet TAKE 1 TABLET BY MOUTH EVERY DAY 30 tablet 5  . mometasone (NASONEX) 50 MCG/ACT nasal spray PLACE 2 SPRAYS INTO THE NOSE DAILY. 17 g 12  . Nerve Stimulator (CEFALY KIT) DEVI 1 Units by Does not apply  route 2 (two) times daily. 1 application 1  . SUMAtriptan (IMITREX) 50 MG tablet Please specify directions, refills and quantity 9 tablet 0  . tretinoin (RETIN-A) 0.05 % cream Apply 1 application topically at bedtime.    . triamcinolone ointment (KENALOG) 0.5 % Apply 1 application topically 2 (two) times daily. 30 g 0  . UNABLE TO FIND once a week. Med Name: allergy shots    . urea (CARMOL) 40 % CREA 1 APPLICATION AS NEEDED ONCE A DAY EXTERNALLY 30 DAYS     No facility-administered medications prior to visit.    ROS: Review of Systems  Constitutional: Negative for activity change, appetite change, chills, fatigue and unexpected weight change.  HENT: Negative for congestion, mouth sores and sinus pressure.   Eyes: Negative for visual disturbance.  Respiratory: Negative for cough and chest tightness.   Gastrointestinal: Negative for abdominal pain and nausea.  Genitourinary: Negative for difficulty urinating, frequency and vaginal pain.  Musculoskeletal: Negative for back pain and gait problem.  Skin: Negative for pallor and rash.  Neurological: Negative for dizziness, tremors, weakness, numbness and headaches.  Psychiatric/Behavioral: Negative for confusion and sleep disturbance.    Objective:  BP 126/78 (BP Location: Left Arm, Patient Position: Sitting, Cuff Size: Normal)   Pulse 85   Temp 98.6  F (37 C) (Oral)   Ht _0  (1.575 m)   Wt 101 lb (45.8 kg)   SpO2 99%   BMI 18.47 kg/m   BP Readings from Last 3 Encounters:  07/20/19 126/78  06/17/19 120/86  05/27/19 (!) 143/86    Wt Readings from Last 3 Encounters:  07/20/19 101 lb (45.8 kg)  06/17/19 102 lb (46.3 kg)  05/27/19 101 lb (45.8 kg)    Physical Exam Constitutional:      General: She is not in acute distress.    Appearance: She is well-developed.  HENT:     Head: Normocephalic.     Right Ear: External ear normal.     Left Ear: External ear normal.     Nose: Nose normal.  Eyes:     General:        Right  eye: No discharge.        Left eye: No discharge.     Conjunctiva/sclera: Conjunctivae normal.     Pupils: Pupils are equal, round, and reactive to light.  Neck:     Thyroid: No thyromegaly.     Vascular: No JVD.     Trachea: No tracheal deviation.  Cardiovascular:     Rate and Rhythm: Normal rate and regular rhythm.     Heart sounds: Normal heart sounds.  Pulmonary:     Effort: No respiratory distress.     Breath sounds: No stridor. No wheezing.  Abdominal:     General: Bowel sounds are normal. There is no distension.     Palpations: Abdomen is soft. There is no mass.     Tenderness: There is no abdominal tenderness. There is no guarding or rebound.  Musculoskeletal:        General: No tenderness.     Cervical back: Normal [name] of motion and neck supple.  Lymphadenopathy:     Cervical: No cervical adenopathy.  Skin:    Findings: No erythema or rash.  Neurological:     Cranial Nerves: No cranial nerve deficit.     Motor: No abnormal muscle tone.     Coordination: Coordination normal.     Deep Tendon Reflexes: Reflexes normal.  Psychiatric:        Behavior: Behavior normal.        Thought Content: Thought content normal.        Judgment: Judgment normal.    Head NT Neck - pain w/ROM Lab Results  Component Value Date   WBC 4.1 01/13/2019   HGB 13.4 01/13/2019   HCT 40.0 01/13/2019   PLT 190.0 01/13/2019   GLUCOSE 90 01/13/2019   CHOL 213 (H) 01/13/2019   TRIG 113.0 01/13/2019   HDL 73.10 01/13/2019   LDLCALC 117 (H) 01/13/2019   ALT 14 01/13/2019   AST 16 01/13/2019   NA 140 01/13/2019   K 4.1 01/13/2019   CL 103 01/13/2019   CREATININE 0.78 01/13/2019   BUN 18 01/13/2019   CO2 29 01/13/2019   TSH 2.01 01/13/2019    DG Bone Density  Result Date: 01/16/2019 Date of study: 01/13/2019 Exam: DUAL X-RAY ABSORPTIOMETRY (DXA) FOR BONE MINERAL DENSITY (BMD) Instrument: [employer] Requesting Provider: PCP Indication: follow up for low BMD Comparison:  07/21/2015 Clinical data: Pt is a 69 y.o. female without previous history of fragility fracture. On calcium. Results:  Lumbar spine L1-L4 (L3) Femoral neck (FN) 33% distal radius T-score -2.0 RFN: -2.3 LFN: -2.1 n/a Change in BMD from previous DXA test (%) -8.4%* -3.3% n/a (*)  statistically significant Assessment: the BMD is low according to the [facility] classification for osteoporosis (see below). Fracture risk: moderate FRAX score: 10 year major osteoporotic risk: 16.9%. 10 year hip fracture risk: 3.6%. The thresholds for treatment are 20% and 3%, respectively. Comments: the technical quality of the study is good, however, L3 vertebra had to be excluded from analysis due to degenerative changes. Evaluation for secondary causes should be considered if clinically indicated. Recommend optimizing calcium (1200 mg/day) and vitamin D (800 IU/day) intake. Followup: Repeat BMD is appropriate after 2 years or after 1-2 years if starting treatment. WHO criteria for diagnosis of osteoporosis in postmenopausal women and in men 89 y/o or older: - normal: T-score -1.0 to + 1.0 - osteopenia/low bone density: T-score between -2.5 and -1.0 - osteoporosis: T-score below -2.5 - severe osteoporosis: T-score below -2.5 with history of fragility fracture Note: although not part of the WHO classification, the presence of a fragility fracture, regardless of the T-score, should be considered diagnostic of osteoporosis, provided other causes for the fracture have been excluded. Treatment: The National Osteoporosis Foundation recommends that treatment be considered in postmenopausal women and men age 29 or older with: 1. Hip or vertebral (clinical or morphometric) fracture 2. T-score of - 2.5 or lower at the spine or hip 3. 10-year fracture probability by FRAX of at least 20% for a major osteoporotic fracture and 3% for a hip fracture Philemon Kingdom, MD [facility]    Assessment & Plan:   There are no diagnoses linked to this  encounter.   No orders of the defined types were placed in this encounter.    Follow-up: No follow-ups on file.  [name], MD

## 2019-07-20 NOTE — Telephone Encounter (Signed)
  Pt c/o medication issue:  1. Name of Medication: DEXILANT 60 MG capsule  2. How are you currently taking this medication (dosage and times per day)? As written  3. Are you having a reaction (difficulty breathing--STAT)? No  4. What is your medication issue? Patient wants to know if Dr [name] will write new RX, patient does not want to schedule with [facility] to renew

## 2019-07-20 NOTE — Telephone Encounter (Signed)
Pls advise if ok to renew was originally rx by her GI provider.Marland KitchenJohny Chess

## 2019-07-20 NOTE — Assessment & Plan Note (Signed)
Worse 

## 2019-07-20 NOTE — Assessment & Plan Note (Signed)
On B12 

## 2019-07-20 NOTE — Telephone Encounter (Signed)
Okay.  Done.  Thanks 

## 2019-07-20 NOTE — Assessment & Plan Note (Signed)
Chronic HA's

## 2019-07-20 NOTE — Assessment & Plan Note (Signed)
Xanax prn 

## 2019-07-21 NOTE — Telephone Encounter (Signed)
Notified pt MD sent rx to [facility] 

## 2019-07-23 ENCOUNTER — Ambulatory Visit: Payer: Medicare Other

## 2019-07-28 ENCOUNTER — Ambulatory Visit: Payer: Medicare Other | Admitting: Family Medicine

## 2019-08-03 ENCOUNTER — Ambulatory Visit: Payer: Medicare Other

## 2019-08-06 ENCOUNTER — Encounter: Payer: Self-pay | Admitting: Internal Medicine

## 2019-08-10 ENCOUNTER — Ambulatory Visit
Admission: RE | Admit: 2019-08-10 | Discharge: 2019-08-10 | Disposition: A | Discharge status: Home / Self Care | Payer: Medicare Other | Source: Ambulatory Visit | Attending: Internal Medicine | Admitting: Internal Medicine

## 2019-08-10 ENCOUNTER — Ambulatory Visit (INDEPENDENT_AMBULATORY_CARE_PROVIDER_SITE_OTHER): Payer: Medicare Other | Admitting: Internal Medicine

## 2019-08-10 ENCOUNTER — Other Ambulatory Visit: Payer: Self-pay

## 2019-08-10 ENCOUNTER — Ambulatory Visit: Payer: Medicare Other | Admitting: Internal Medicine

## 2019-08-10 ENCOUNTER — Encounter: Payer: Self-pay | Admitting: Internal Medicine

## 2019-08-10 DIAGNOSIS — R519 Headache, unspecified: Secondary | ICD-10-CM | POA: Diagnosis not present

## 2019-08-10 DIAGNOSIS — T881XXS Other complications following immunization, not elsewhere classified, sequela: Secondary | ICD-10-CM

## 2019-08-10 DIAGNOSIS — G4459 Other complicated headache syndrome: Secondary | ICD-10-CM

## 2019-08-10 DIAGNOSIS — T50Z95A Adverse effect of other vaccines and biological substances, initial encounter: Secondary | ICD-10-CM | POA: Diagnosis not present

## 2019-08-10 DIAGNOSIS — M4802 Spinal stenosis, cervical region: Secondary | ICD-10-CM | POA: Diagnosis not present

## 2019-08-10 DIAGNOSIS — R000 Tachycardia, unspecified: Secondary | ICD-10-CM

## 2019-08-10 DIAGNOSIS — T881XXA Other complications following immunization, not elsewhere classified, initial encounter: Secondary | ICD-10-CM | POA: Insufficient documentation

## 2019-08-10 DIAGNOSIS — M542 Cervicalgia: Secondary | ICD-10-CM

## 2019-08-10 NOTE — Progress Notes (Signed)
Subjective:  Patient ID: [name], female    DOB: [birth year]  Age: 69 y.o. MRN: [id number]  CC: No chief complaint on file.   HPI [name] presents for a tachycardic episode after her vaccination -   Per pt: "tachycardia at 202 bpm (per my finger pulse oximeter) I had an hour after my first [employer] vaccine on 1/29. I called [employer] today and spoke with one of their medical information agents. Since a fast heart rate is listed as a sign of severe allergic reaction it's a concern about getting the second vaccine which usually has more side effects. John, the agent suggested I take an antibody test through my primary care doctor to see how much coverage I have incase I don't take the 2nd vaccine. I would like to take that. I had already set up an appointment with you to discuss taking the 2nd vaccine.Now that I have this new information can I take that test(I believe he said it was a blood test) when I come in to see you on 2/22 at 10:40? I didn't know if there is any special preparation before the test."    Outpatient Medications Prior to Visit  Medication Sig Dispense Refill  . ALPRAZolam (XANAX) 0.25 MG tablet 0.5-1 po bid prn anxiety 30 tablet 1  . Botulinum Toxin Type A, Cosm, (BOTOX COSMETIC) 50 UNITS SOLR Inject 4-6 Units into the muscle. 3 month  injection    . cefdinir (OMNICEF) 300 MG capsule Take 300 mg by mouth 2 (two) times daily.    . Cholecalciferol (VITAMIN D3 PO) Take 50 mcg by mouth daily.    [name] conjugated estrogens (PREMARIN) vaginal cream Use PV once every 2-3 days 90 g 2  . Cyanocobalamin (VITAMIN B-12) 1000 MCG SUBL PLACE 1 TABLET (1,000 MCG TOTAL) UNDER THE TONGUE DAILY. 100 tablet 3  . dexlansoprazole (DEXILANT) 60 MG capsule Take 1 capsule (60 mg total) by mouth daily. 90 capsule 3  . EPIPEN 2-PAK 0.3 MG/0.3ML SOAJ injection See admin instructions. Reported on 10/08/2015  1  . famotidine (PEPCID) 40 MG tablet Take 1 tablet (40 mg total) by mouth daily.  90 tablet 3  . flecainide (TAMBOCOR) 50 MG tablet Take 50 mg by mouth as needed (take 1 tablet as needed for fast heart rate).    . fluocinonide cream (LIDEX) 6.96 % Apply 1 application topically 2 (two) times daily. 90 g 2  . hyoscyamine (LEVSIN SL) 0.125 MG SL tablet Place 1 tablet (0.125 mg total) under the tongue every 4 (four) hours as needed (for throat tightness). 30 tablet 3  . loratadine (CLARITIN) 10 MG tablet Take 1 tablet (10 mg total) by mouth daily. 90 tablet 3  . mometasone (NASONEX) 50 MCG/ACT nasal spray PLACE 2 SPRAYS INTO THE NOSE DAILY. 17 g 12  . Nerve Stimulator (CEFALY KIT) DEVI 1 Units by Does not apply route 2 (two) times daily. 1 application 1  . SUMAtriptan (IMITREX) 50 MG tablet Please specify directions, refills and quantity 9 tablet 0  . tretinoin (RETIN-A) 0.05 % cream Apply 1 application topically at bedtime.    . triamcinolone ointment (KENALOG) 0.5 % Apply 1 application topically 2 (two) times daily. 30 g 0  . UNABLE TO FIND once a week. Med Name: allergy shots    . urea (CARMOL) 40 % CREA 1 APPLICATION AS NEEDED ONCE A DAY EXTERNALLY 30 DAYS     No facility-administered medications prior to visit.    ROS: Review of Systems  Constitutional: Negative for activity change, appetite change, chills, fatigue and unexpected weight change.  HENT: Negative for congestion, mouth sores and sinus pressure.   Eyes: Negative for visual disturbance.  Respiratory: Negative for cough and chest tightness.   Gastrointestinal: Negative for abdominal pain and nausea.  Genitourinary: Negative for difficulty urinating, frequency and vaginal pain.  Musculoskeletal: Positive for neck pain and neck stiffness. Negative for back pain and gait problem.  Skin: Negative for pallor and rash.  Neurological: Negative for dizziness, tremors, weakness, numbness and headaches.  Psychiatric/Behavioral: Negative for confusion and sleep disturbance.    Objective:  BP (!) 154/82 (BP Location:  Left Arm, Patient Position: Sitting, Cuff Size: Normal)   Pulse 92   Temp 98.6 F (37 C) (Oral)   Ht 5' 2"  (1.575 m)   Wt 101 lb (45.8 kg)   SpO2 98%   BMI 18.47 kg/m   BP Readings from Last 3 Encounters:  08/10/19 (!) 154/82  07/20/19 126/78  06/17/19 120/86    Wt Readings from Last 3 Encounters:  08/10/19 101 lb (45.8 kg)  07/20/19 101 lb (45.8 kg)  06/17/19 102 lb (46.3 kg)    Physical Exam Constitutional:      General: She is not in acute distress.    Appearance: She is well-developed.  HENT:     Head: Normocephalic.     Right Ear: External ear normal.     Left Ear: External ear normal.     Nose: Nose normal.  Eyes:     General:        Right eye: No discharge.        Left eye: No discharge.     Conjunctiva/sclera: Conjunctivae normal.     Pupils: Pupils are equal, round, and reactive to light.  Neck:     Thyroid: No thyromegaly.     Vascular: No JVD.     Trachea: No tracheal deviation.  Cardiovascular:     Rate and Rhythm: Normal rate and regular rhythm.     Heart sounds: Normal heart sounds.  Pulmonary:     Effort: No respiratory distress.     Breath sounds: No stridor. No wheezing.  Abdominal:     General: Bowel sounds are normal. There is no distension.     Palpations: Abdomen is soft. There is no mass.     Tenderness: There is no abdominal tenderness. There is no guarding or rebound.  Musculoskeletal:        General: Tenderness present.     Cervical back: Normal [name] of motion and neck supple.  Lymphadenopathy:     Cervical: No cervical adenopathy.  Skin:    Findings: No erythema or rash.  Neurological:     Cranial Nerves: No cranial nerve deficit.     Motor: No abnormal muscle tone.     Coordination: Coordination normal.     Deep Tendon Reflexes: Reflexes normal.  Psychiatric:        Behavior: Behavior normal.        Thought Content: Thought content normal.        Judgment: Judgment normal.     Lab Results  Component Value Date   WBC  4.1 01/13/2019   HGB 13.4 01/13/2019   HCT 40.0 01/13/2019   PLT 190.0 01/13/2019   GLUCOSE 90 01/13/2019   CHOL 213 (H) 01/13/2019   TRIG 113.0 01/13/2019   HDL 73.10 01/13/2019   LDLCALC 117 (H) 01/13/2019   ALT 14 01/13/2019   AST 16 01/13/2019  NA 140 01/13/2019   K 4.1 01/13/2019   CL 103 01/13/2019   CREATININE 0.78 01/13/2019   BUN 18 01/13/2019   CO2 29 01/13/2019   TSH 2.01 01/13/2019    CT Head Wo Contrast  Result Date: 08/10/2019 CLINICAL DATA:  Complicated headache syndrome.  Neck pain. EXAM: CT HEAD WITHOUT CONTRAST CT CERVICAL SPINE WITHOUT CONTRAST TECHNIQUE: Multidetector CT imaging of the head and cervical spine was performed following the standard protocol without intravenous contrast. Multiplanar CT image reconstructions of the cervical spine were also generated. COMPARISON:  None. FINDINGS: CT HEAD FINDINGS Brain: No evidence of acute infarction, hemorrhage, hydrocephalus, extra-axial collection or mass lesion/mass effect. Vascular: Negative for hyperdense vessel Skull: Negative Sinuses/Orbits: Negative Other: None CT CERVICAL SPINE FINDINGS Alignment: 3 mm anterolisthesis C3-4. Mild anterolisthesis C2-3 and C7-T1 Skull base and vertebrae: Negative for fracture or mass. Soft tissues and spinal canal: Negative for mass or soft tissue swelling. Disc levels: Multilevel disc and facet degeneration in the cervical spine. Mild left foraminal narrowing C3-4 due to spurring. Marked left foraminal narrowing C4-5 due to prominent uncinate spurring and facet hypertrophy. Associated spinal stenosis. Moderate spinal stenosis and moderate left foraminal stenosis at C5-6. Mild foraminal narrowing bilaterally C6-7. Bilateral facet degeneration at C7-T1. Upper chest: Apical scarring bilaterally Other: None IMPRESSION: Negative CT head Cervical spondylosis with spinal and foraminal stenosis as above. No acute cervical spine abnormality. Electronically Signed   By: Franchot Gallo M.D.   On:  08/10/2019 09:06   CT CERVICAL SPINE WO CONTRAST  Result Date: 08/10/2019 CLINICAL DATA:  Complicated headache syndrome.  Neck pain. EXAM: CT HEAD WITHOUT CONTRAST CT CERVICAL SPINE WITHOUT CONTRAST TECHNIQUE: Multidetector CT imaging of the head and cervical spine was performed following the standard protocol without intravenous contrast. Multiplanar CT image reconstructions of the cervical spine were also generated. COMPARISON:  None. FINDINGS: CT HEAD FINDINGS Brain: No evidence of acute infarction, hemorrhage, hydrocephalus, extra-axial collection or mass lesion/mass effect. Vascular: Negative for hyperdense vessel Skull: Negative Sinuses/Orbits: Negative Other: None CT CERVICAL SPINE FINDINGS Alignment: 3 mm anterolisthesis C3-4. Mild anterolisthesis C2-3 and C7-T1 Skull base and vertebrae: Negative for fracture or mass. Soft tissues and spinal canal: Negative for mass or soft tissue swelling. Disc levels: Multilevel disc and facet degeneration in the cervical spine. Mild left foraminal narrowing C3-4 due to spurring. Marked left foraminal narrowing C4-5 due to prominent uncinate spurring and facet hypertrophy. Associated spinal stenosis. Moderate spinal stenosis and moderate left foraminal stenosis at C5-6. Mild foraminal narrowing bilaterally C6-7. Bilateral facet degeneration at C7-T1. Upper chest: Apical scarring bilaterally Other: None IMPRESSION: Negative CT head Cervical spondylosis with spinal and foraminal stenosis as above. No acute cervical spine abnormality. Electronically Signed   By: Franchot Gallo M.D.   On: 08/10/2019 09:06    Assessment & Plan:   There are no diagnoses linked to this encounter.   No orders of the defined types were placed in this encounter.    Follow-up: No follow-ups on file.  [name], MD

## 2019-08-10 NOTE — Assessment & Plan Note (Signed)
SVT post COVID Moderna 1st dose  You can premedicate yourself with Ibuprofen 400 mg and Benadryl 25 mg if you decide to get vaccinated Flecainide prn tachycardia

## 2019-08-10 NOTE — Patient Instructions (Addendum)
You can premedicate yourself with Ibuprofen 400 mg and Benadryl 25 mg if you decide to get vaccinated

## 2019-08-10 NOTE — Assessment & Plan Note (Signed)
Flecainide prn tachycardia

## 2019-08-10 NOTE — Assessment & Plan Note (Addendum)
SVT post COVID Moderna 1st dose  You can premedicate yourself with Ibuprofen 400 mg and Benadryl 25 mg if you decide to get vaccinated Flecainide prn tachycardia

## 2019-08-11 LAB — SAR COV2 SEROLOGY (COVID19)AB(IGG),IA: SARS CoV2 AB IGG: POSITIVE — AB

## 2019-08-14 NOTE — Telephone Encounter (Signed)
Patient calling to check status of this message. Advised patient that results were released before provider could interpret them. Please advise.

## 2019-08-16 ENCOUNTER — Ambulatory Visit: Payer: Medicare Other

## 2019-09-01 DIAGNOSIS — J3089 Other allergic rhinitis: Secondary | ICD-10-CM | POA: Diagnosis not present

## 2019-09-01 DIAGNOSIS — J301 Allergic rhinitis due to pollen: Secondary | ICD-10-CM | POA: Diagnosis not present

## 2019-09-04 DIAGNOSIS — B029 Zoster without complications: Secondary | ICD-10-CM | POA: Diagnosis not present

## 2019-09-04 DIAGNOSIS — L821 Other seborrheic keratosis: Secondary | ICD-10-CM | POA: Diagnosis not present

## 2019-09-04 DIAGNOSIS — Z23 Encounter for immunization: Secondary | ICD-10-CM | POA: Diagnosis not present

## 2019-09-07 DIAGNOSIS — J3089 Other allergic rhinitis: Secondary | ICD-10-CM | POA: Diagnosis not present

## 2019-09-07 DIAGNOSIS — J301 Allergic rhinitis due to pollen: Secondary | ICD-10-CM | POA: Diagnosis not present

## 2019-09-09 DIAGNOSIS — J3089 Other allergic rhinitis: Secondary | ICD-10-CM | POA: Diagnosis not present

## 2019-09-09 DIAGNOSIS — J301 Allergic rhinitis due to pollen: Secondary | ICD-10-CM | POA: Diagnosis not present

## 2019-09-10 ENCOUNTER — Ambulatory Visit: Payer: Medicare Other | Admitting: Internal Medicine

## 2019-09-14 ENCOUNTER — Ambulatory Visit: Payer: Medicare Other | Admitting: Internal Medicine

## 2019-09-14 DIAGNOSIS — J3089 Other allergic rhinitis: Secondary | ICD-10-CM | POA: Diagnosis not present

## 2019-09-14 DIAGNOSIS — J301 Allergic rhinitis due to pollen: Secondary | ICD-10-CM | POA: Diagnosis not present

## 2019-09-15 ENCOUNTER — Ambulatory Visit: Payer: Medicare Other | Admitting: Internal Medicine

## 2019-09-16 DIAGNOSIS — J301 Allergic rhinitis due to pollen: Secondary | ICD-10-CM | POA: Diagnosis not present

## 2019-09-16 DIAGNOSIS — J3089 Other allergic rhinitis: Secondary | ICD-10-CM | POA: Diagnosis not present

## 2019-09-17 ENCOUNTER — Encounter: Payer: Self-pay | Admitting: Internal Medicine

## 2019-09-17 ENCOUNTER — Other Ambulatory Visit: Payer: Self-pay

## 2019-09-17 ENCOUNTER — Ambulatory Visit (INDEPENDENT_AMBULATORY_CARE_PROVIDER_SITE_OTHER): Payer: Medicare Other | Admitting: Internal Medicine

## 2019-09-17 DIAGNOSIS — B029 Zoster without complications: Secondary | ICD-10-CM | POA: Insufficient documentation

## 2019-09-17 DIAGNOSIS — H2513 Age-related nuclear cataract, bilateral: Secondary | ICD-10-CM | POA: Diagnosis not present

## 2019-09-17 MED ORDER — SHINGRIX 50 MCG/0.5ML IM SUSR: 0.5000 mL | Freq: Once | INTRAMUSCULAR | 1 refills | Status: AC

## 2019-09-17 NOTE — Patient Instructions (Signed)
You can get Shingrix vaccine in 3-6 months

## 2019-09-17 NOTE — Assessment & Plan Note (Addendum)
[name] was diagnosed with presumed H zoster and given Valtrex.  The rash has resolved.  She never had pain. Madelena declined Shingrix vaccination in the past.  OK to can get Shingrix vaccine in 3-6 months

## 2019-09-17 NOTE — Progress Notes (Signed)
Subjective:  Patient ID: [name], female    DOB: August 20, 1950  Age: 69 y.o. MRN: [id number]  CC: No chief complaint on file.   HPI [name] presents for bumps on the roof of the mouth on the R that started 2 weeks ago 2 wks ago. Pt had no pain on the hard palate, gum.  No headache.  She saw her dentist and her dermatologist.  She was diagnosed with presumed H zoster and given Valtrex.  The rash has resolved.  She never had pain. Bessie declined Shingrix vaccination in the past.  Outpatient Medications Prior to Visit  Medication Sig Dispense Refill  . ALPRAZolam (XANAX) 0.25 MG tablet 0.5-1 po bid prn anxiety 30 tablet 1  . Botulinum Toxin Type A, Cosm, (BOTOX COSMETIC) 50 UNITS SOLR Inject 4-6 Units into the muscle. 3 month  injection    . Cholecalciferol (VITAMIN D3 PO) Take 50 mcg by mouth daily.    [name] conjugated estrogens (PREMARIN) vaginal cream Use PV once every 2-3 days 90 g 2  . Cyanocobalamin (VITAMIN B-12) 1000 MCG SUBL PLACE 1 TABLET (1,000 MCG TOTAL) UNDER THE TONGUE DAILY. 100 tablet 3  . dexlansoprazole (DEXILANT) 60 MG capsule Take 1 capsule (60 mg total) by mouth daily. 90 capsule 3  . EPIPEN 2-PAK 0.3 MG/0.3ML SOAJ injection See admin instructions. Reported on 10/08/2015  1  . famotidine (PEPCID) 40 MG tablet Take 1 tablet (40 mg total) by mouth daily. 90 tablet 3  . flecainide (TAMBOCOR) 50 MG tablet Take 50 mg by mouth as needed (take 1 tablet as needed for fast heart rate).    . fluocinonide cream (LIDEX) 5.09 % Apply 1 application topically 2 (two) times daily. 90 g 2  . hyoscyamine (LEVSIN SL) 0.125 MG SL tablet Place 1 tablet (0.125 mg total) under the tongue every 4 (four) hours as needed (for throat tightness). 30 tablet 3  . loratadine (CLARITIN) 10 MG tablet Take 1 tablet (10 mg total) by mouth daily. 90 tablet 3  . mometasone (NASONEX) 50 MCG/ACT nasal spray PLACE 2 SPRAYS INTO THE NOSE DAILY. 17 g 12  . Nerve Stimulator (CEFALY KIT) DEVI 1  Units by Does not apply route 2 (two) times daily. 1 application 1  . SUMAtriptan (IMITREX) 50 MG tablet Please specify directions, refills and quantity 9 tablet 0  . tretinoin (RETIN-A) 0.05 % cream Apply 1 application topically at bedtime.    . triamcinolone ointment (KENALOG) 0.5 % Apply 1 application topically 2 (two) times daily. 30 g 0  . UNABLE TO FIND once a week. Med Name: allergy shots    . urea (CARMOL) 40 % CREA 1 APPLICATION AS NEEDED ONCE A DAY EXTERNALLY 30 DAYS    . cefdinir (OMNICEF) 300 MG capsule Take 300 mg by mouth 2 (two) times daily.    [name] nystatin (MYCOSTATIN) 100000 UNIT/ML suspension     . valACYclovir (VALTREX) 1000 MG tablet Take 1,000 mg by mouth 3 (three) times daily.     No facility-administered medications prior to visit.    ROS: Review of Systems  Constitutional: Negative for activity change, appetite change, chills, fatigue and unexpected weight change.  HENT: Negative for congestion, mouth sores and sinus pressure.   Eyes: Negative for visual disturbance.  Respiratory: Negative for cough and chest tightness.   Gastrointestinal: Negative for abdominal pain and nausea.  Genitourinary: Negative for difficulty urinating, frequency and vaginal pain.  Musculoskeletal: Negative for back pain and gait problem.  Skin: Negative for  pallor and rash.  Neurological: Negative for dizziness, tremors, weakness, numbness and headaches.  Psychiatric/Behavioral: Negative for confusion and sleep disturbance.    Objective:  BP 128/80 (BP Location: Left Arm, Patient Position: Sitting, Cuff Size: Normal)   Pulse 80   Temp 98.9 F (37.2 C) (Oral)   Ht _0  (1.575 m)   Wt 100 lb (45.4 kg)   SpO2 99%   BMI 18.29 kg/m   BP Readings from Last 3 Encounters:  09/17/19 128/80  08/10/19 (!) 154/82  07/20/19 126/78    Wt Readings from Last 3 Encounters:  09/17/19 100 lb (45.4 kg)  08/10/19 101 lb (45.8 kg)  07/20/19 101 lb (45.8 kg)    Physical  Exam Constitutional:      General: She is not in acute distress.    Appearance: She is well-developed.  HENT:     Head: Normocephalic.     Right Ear: External ear normal.     Left Ear: External ear normal.     Nose: Nose normal.  Eyes:     General:        Right eye: No discharge.        Left eye: No discharge.     Conjunctiva/sclera: Conjunctivae normal.     Pupils: Pupils are equal, round, and reactive to light.  Neck:     Thyroid: No thyromegaly.     Vascular: No JVD.     Trachea: No tracheal deviation.  Cardiovascular:     Rate and Rhythm: Normal rate and regular rhythm.     Heart sounds: Normal heart sounds.  Pulmonary:     Effort: No respiratory distress.     Breath sounds: No stridor. No wheezing.  Abdominal:     General: Bowel sounds are normal. There is no distension.     Palpations: Abdomen is soft. There is no mass.     Tenderness: There is no abdominal tenderness. There is no guarding or rebound.  Musculoskeletal:        General: No tenderness.     Cervical back: Normal [name] of motion and neck supple.  Lymphadenopathy:     Cervical: No cervical adenopathy.  Skin:    Findings: No erythema or rash.  Neurological:     Cranial Nerves: No cranial nerve deficit.     Motor: No abnormal muscle tone.     Coordination: Coordination normal.     Deep Tendon Reflexes: Reflexes normal.  Psychiatric:        Behavior: Behavior normal.        Thought Content: Thought content normal.        Judgment: Judgment normal.     Lab Results  Component Value Date   WBC 4.1 01/13/2019   HGB 13.4 01/13/2019   HCT 40.0 01/13/2019   PLT 190.0 01/13/2019   GLUCOSE 90 01/13/2019   CHOL 213 (H) 01/13/2019   TRIG 113.0 01/13/2019   HDL 73.10 01/13/2019   LDLCALC 117 (H) 01/13/2019   ALT 14 01/13/2019   AST 16 01/13/2019   NA 140 01/13/2019   K 4.1 01/13/2019   CL 103 01/13/2019   CREATININE 0.78 01/13/2019   BUN 18 01/13/2019   CO2 29 01/13/2019   TSH 2.01 01/13/2019     CT Head Wo Contrast  Result Date: 08/10/2019 CLINICAL DATA:  Complicated headache syndrome.  Neck pain. EXAM: CT HEAD WITHOUT CONTRAST CT CERVICAL SPINE WITHOUT CONTRAST TECHNIQUE: Multidetector CT imaging of the head and cervical spine was performed following the standard  protocol without intravenous contrast. Multiplanar CT image reconstructions of the cervical spine were also generated. COMPARISON:  None. FINDINGS: CT HEAD FINDINGS Brain: No evidence of acute infarction, hemorrhage, hydrocephalus, extra-axial collection or mass lesion/mass effect. Vascular: Negative for hyperdense vessel Skull: Negative Sinuses/Orbits: Negative Other: None CT CERVICAL SPINE FINDINGS Alignment: 3 mm anterolisthesis C3-4. Mild anterolisthesis C2-3 and C7-T1 Skull base and vertebrae: Negative for fracture or mass. Soft tissues and spinal canal: Negative for mass or soft tissue swelling. Disc levels: Multilevel disc and facet degeneration in the cervical spine. Mild left foraminal narrowing C3-4 due to spurring. Marked left foraminal narrowing C4-5 due to prominent uncinate spurring and facet hypertrophy. Associated spinal stenosis. Moderate spinal stenosis and moderate left foraminal stenosis at C5-6. Mild foraminal narrowing bilaterally C6-7. Bilateral facet degeneration at C7-T1. Upper chest: Apical scarring bilaterally Other: None IMPRESSION: Negative CT head Cervical spondylosis with spinal and foraminal stenosis as above. No acute cervical spine abnormality. Electronically Signed   By: Franchot Gallo M.D.   On: 08/10/2019 09:06   CT CERVICAL SPINE WO CONTRAST  Result Date: 08/10/2019 CLINICAL DATA:  Complicated headache syndrome.  Neck pain. EXAM: CT HEAD WITHOUT CONTRAST CT CERVICAL SPINE WITHOUT CONTRAST TECHNIQUE: Multidetector CT imaging of the head and cervical spine was performed following the standard protocol without intravenous contrast. Multiplanar CT image reconstructions of the cervical spine were also  generated. COMPARISON:  None. FINDINGS: CT HEAD FINDINGS Brain: No evidence of acute infarction, hemorrhage, hydrocephalus, extra-axial collection or mass lesion/mass effect. Vascular: Negative for hyperdense vessel Skull: Negative Sinuses/Orbits: Negative Other: None CT CERVICAL SPINE FINDINGS Alignment: 3 mm anterolisthesis C3-4. Mild anterolisthesis C2-3 and C7-T1 Skull base and vertebrae: Negative for fracture or mass. Soft tissues and spinal canal: Negative for mass or soft tissue swelling. Disc levels: Multilevel disc and facet degeneration in the cervical spine. Mild left foraminal narrowing C3-4 due to spurring. Marked left foraminal narrowing C4-5 due to prominent uncinate spurring and facet hypertrophy. Associated spinal stenosis. Moderate spinal stenosis and moderate left foraminal stenosis at C5-6. Mild foraminal narrowing bilaterally C6-7. Bilateral facet degeneration at C7-T1. Upper chest: Apical scarring bilaterally Other: None IMPRESSION: Negative CT head Cervical spondylosis with spinal and foraminal stenosis as above. No acute cervical spine abnormality. Electronically Signed   By: Franchot Gallo M.D.   On: 08/10/2019 09:06    Assessment & Plan:   There are no diagnoses linked to this encounter.   No orders of the defined types were placed in this encounter.    Follow-up: No follow-ups on file.  [name], MD

## 2019-09-21 ENCOUNTER — Other Ambulatory Visit: Payer: Medicare Other

## 2019-09-21 DIAGNOSIS — J3089 Other allergic rhinitis: Secondary | ICD-10-CM | POA: Diagnosis not present

## 2019-09-21 DIAGNOSIS — J301 Allergic rhinitis due to pollen: Secondary | ICD-10-CM | POA: Diagnosis not present

## 2019-09-23 DIAGNOSIS — J3089 Other allergic rhinitis: Secondary | ICD-10-CM | POA: Diagnosis not present

## 2019-09-23 DIAGNOSIS — J301 Allergic rhinitis due to pollen: Secondary | ICD-10-CM | POA: Diagnosis not present

## 2019-09-25 DIAGNOSIS — L905 Scar conditions and fibrosis of skin: Secondary | ICD-10-CM | POA: Diagnosis not present

## 2019-09-25 DIAGNOSIS — R238 Other skin changes: Secondary | ICD-10-CM | POA: Diagnosis not present

## 2019-09-28 DIAGNOSIS — J301 Allergic rhinitis due to pollen: Secondary | ICD-10-CM | POA: Diagnosis not present

## 2019-09-28 DIAGNOSIS — J3089 Other allergic rhinitis: Secondary | ICD-10-CM | POA: Diagnosis not present

## 2019-10-05 DIAGNOSIS — J3089 Other allergic rhinitis: Secondary | ICD-10-CM | POA: Diagnosis not present

## 2019-10-05 DIAGNOSIS — J301 Allergic rhinitis due to pollen: Secondary | ICD-10-CM | POA: Diagnosis not present

## 2019-10-12 DIAGNOSIS — J301 Allergic rhinitis due to pollen: Secondary | ICD-10-CM | POA: Diagnosis not present

## 2019-10-12 DIAGNOSIS — J3089 Other allergic rhinitis: Secondary | ICD-10-CM | POA: Diagnosis not present

## 2019-10-19 DIAGNOSIS — J3089 Other allergic rhinitis: Secondary | ICD-10-CM | POA: Diagnosis not present

## 2019-10-19 DIAGNOSIS — J301 Allergic rhinitis due to pollen: Secondary | ICD-10-CM | POA: Diagnosis not present

## 2019-10-21 ENCOUNTER — Ambulatory Visit (INDEPENDENT_AMBULATORY_CARE_PROVIDER_SITE_OTHER): Payer: Medicare Other | Admitting: Internal Medicine

## 2019-10-21 ENCOUNTER — Other Ambulatory Visit: Payer: Self-pay

## 2019-10-21 ENCOUNTER — Encounter: Payer: Self-pay | Admitting: Internal Medicine

## 2019-10-21 VITALS — BP 134/80 | HR 78 | Temp 98.4°F | Resp 16 | Ht 62.0 in | Wt 100.0 lb

## 2019-10-21 DIAGNOSIS — Z91038 Other insect allergy status: Secondary | ICD-10-CM | POA: Insufficient documentation

## 2019-10-21 MED ORDER — CLOBETASOL PROPIONATE 0.05 % EX OINT: 1.0000 "application " | TOPICAL_OINTMENT | Freq: Two times a day (BID) | CUTANEOUS | 0 refills | Status: DC

## 2019-10-21 NOTE — Progress Notes (Signed)
Subjective:  Patient ID: [name], female    DOB: [birth year]  Age: 69 y.o. MRN: [id number]  CC: Rash  This visit occurred during the SARS-CoV-2 public health emergency.  Safety protocols were in place, including screening questions prior to the visit, additional usage of staff PPE, and extensive cleaning of exam room while observing appropriate contact time as indicated for disinfecting solutions.    HPI [name] presents for a 2 day hx of rash, itching right posterior/medial thigh after an unknown insect bite.  Outpatient Medications Prior to Visit  Medication Sig Dispense Refill  . ALPRAZolam (XANAX) 0.25 MG tablet 0.5-1 po bid prn anxiety 30 tablet 1  . Botulinum Toxin Type A, Cosm, (BOTOX COSMETIC) 50 UNITS SOLR Inject 4-6 Units into the muscle. 3 month  injection    . Cholecalciferol (VITAMIN D3 PO) Take 50 mcg by mouth daily.    [name] conjugated estrogens (PREMARIN) vaginal cream Use PV once every 2-3 days 90 g 2  . Cyanocobalamin (VITAMIN B-12) 1000 MCG SUBL PLACE 1 TABLET (1,000 MCG TOTAL) UNDER THE TONGUE DAILY. 100 tablet 3  . dexlansoprazole (DEXILANT) 60 MG capsule Take 1 capsule (60 mg total) by mouth daily. 90 capsule 3  . EPIPEN 2-PAK 0.3 MG/0.3ML SOAJ injection See admin instructions. Reported on 10/08/2015  1  . famotidine (PEPCID) 40 MG tablet Take 1 tablet (40 mg total) by mouth daily. 90 tablet 3  . flecainide (TAMBOCOR) 50 MG tablet Take 50 mg by mouth as needed (take 1 tablet as needed for fast heart rate).    . fluocinonide cream (LIDEX) 2.22 % Apply 1 application topically 2 (two) times daily. 90 g 2  . hyoscyamine (LEVSIN SL) 0.125 MG SL tablet Place 1 tablet (0.125 mg total) under the tongue every 4 (four) hours as needed (for throat tightness). 30 tablet 3  . loratadine (CLARITIN) 10 MG tablet Take 1 tablet (10 mg total) by mouth daily. 90 tablet 3  . mometasone (NASONEX) 50 MCG/ACT nasal spray PLACE 2 SPRAYS INTO THE NOSE DAILY. 17 g 12  .  Nerve Stimulator (CEFALY KIT) DEVI 1 Units by Does not apply route 2 (two) times daily. 1 application 1  . SUMAtriptan (IMITREX) 50 MG tablet Please specify directions, refills and quantity 9 tablet 0  . tretinoin (RETIN-A) 0.05 % cream Apply 1 application topically at bedtime.    . triamcinolone ointment (KENALOG) 0.5 % Apply 1 application topically 2 (two) times daily. 30 g 0  . UNABLE TO FIND once a week. Med Name: allergy shots    . urea (CARMOL) 40 % CREA 1 APPLICATION AS NEEDED ONCE A DAY EXTERNALLY 30 DAYS     No facility-administered medications prior to visit.    ROS Review of Systems  Constitutional: Negative.  Negative for diaphoresis.  HENT: Negative.  Negative for facial swelling, trouble swallowing and voice change.   Eyes: Negative.   Respiratory: Negative for chest tightness, shortness of breath and wheezing.   Cardiovascular: Negative for chest pain, palpitations and leg swelling.  Gastrointestinal: Negative for abdominal pain, diarrhea, nausea and vomiting.  Endocrine: Negative.   Genitourinary: Negative.   Musculoskeletal: Negative.  Negative for arthralgias.  Skin: Positive for rash.  Neurological: Negative.  Negative for dizziness, weakness, light-headedness and headaches.  Hematological: Negative for adenopathy. Does not bruise/bleed easily.  Psychiatric/Behavioral: Negative.     Objective:  BP 134/80 (BP Location: Left Arm, Patient Position: Sitting, Cuff Size: Normal)   Pulse 78  Temp 98.4 F (36.9 C) (Oral)   Resp 16   Ht 5' 2" (1.575 m)   Wt 100 lb (45.4 kg)   SpO2 97%   BMI 18.29 kg/m   BP Readings from Last 3 Encounters:  10/21/19 134/80  09/17/19 128/80  08/10/19 (!) 154/82    Wt Readings from Last 3 Encounters:  10/21/19 100 lb (45.4 kg)  09/17/19 100 lb (45.4 kg)  08/10/19 101 lb (45.8 kg)    Physical Exam Vitals reviewed.  Constitutional:      Appearance: Normal appearance.  HENT:     Mouth/Throat:     Mouth: Mucous membranes  are moist.  Eyes:     General: No scleral icterus.    Conjunctiva/sclera: Conjunctivae normal.  Cardiovascular:     Rate and Rhythm: Normal rate and regular rhythm.     Pulses: Normal pulses.     Heart sounds: No murmur.  Pulmonary:     Effort: Pulmonary effort is normal.     Breath sounds: No stridor. No wheezing, rhonchi or rales.  Abdominal:     General: Abdomen is flat. Bowel sounds are normal. There is no distension.     Palpations: Abdomen is soft. There is no hepatomegaly, splenomegaly or mass.  Musculoskeletal:        General: Normal [name] of motion.     Cervical back: Neck supple.     Right lower leg: No edema.     Left lower leg: No edema.  Lymphadenopathy:     Cervical: No cervical adenopathy.  Skin:    General: Skin is warm.     Findings: Rash present.     Comments: Right posterior/medial thigh there is a tiny excoriation. See photo.  Neurological:     General: No focal deficit present.     Mental Status: She is alert.  Psychiatric:        Mood and Affect: Mood normal.        Behavior: Behavior normal.     Lab Results  Component Value Date   WBC 4.1 01/13/2019   HGB 13.4 01/13/2019   HCT 40.0 01/13/2019   PLT 190.0 01/13/2019   GLUCOSE 90 01/13/2019   CHOL 213 (H) 01/13/2019   TRIG 113.0 01/13/2019   HDL 73.10 01/13/2019   LDLCALC 117 (H) 01/13/2019   ALT 14 01/13/2019   AST 16 01/13/2019   NA 140 01/13/2019   K 4.1 01/13/2019   CL 103 01/13/2019   CREATININE 0.78 01/13/2019   BUN 18 01/13/2019   CO2 29 01/13/2019   TSH 2.01 01/13/2019    CT Head Wo Contrast  Result Date: 08/10/2019 CLINICAL DATA:  Complicated headache syndrome.  Neck pain. EXAM: CT HEAD WITHOUT CONTRAST CT CERVICAL SPINE WITHOUT CONTRAST TECHNIQUE: Multidetector CT imaging of the head and cervical spine was performed following the standard protocol without intravenous contrast. Multiplanar CT image reconstructions of the cervical spine were also generated. COMPARISON:  None.  FINDINGS: CT HEAD FINDINGS Brain: No evidence of acute infarction, hemorrhage, hydrocephalus, extra-axial collection or mass lesion/mass effect. Vascular: Negative for hyperdense vessel Skull: Negative Sinuses/Orbits: Negative Other: None CT CERVICAL SPINE FINDINGS Alignment: 3 mm anterolisthesis C3-4. Mild anterolisthesis C2-3 and C7-T1 Skull base and vertebrae: Negative for fracture or mass. Soft tissues and spinal canal: Negative for mass or soft tissue swelling. Disc levels: Multilevel disc and facet degeneration in the cervical spine. Mild left foraminal narrowing C3-4 due to spurring. Marked left foraminal narrowing C4-5 due to prominent uncinate spurring and facet  hypertrophy. Associated spinal stenosis. Moderate spinal stenosis and moderate left foraminal stenosis at C5-6. Mild foraminal narrowing bilaterally C6-7. Bilateral facet degeneration at C7-T1. Upper chest: Apical scarring bilaterally Other: None IMPRESSION: Negative CT head Cervical spondylosis with spinal and foraminal stenosis as above. No acute cervical spine abnormality. Electronically Signed   By: Franchot Gallo M.D.   On: 08/10/2019 09:06   CT CERVICAL SPINE WO CONTRAST  Result Date: 08/10/2019 CLINICAL DATA:  Complicated headache syndrome.  Neck pain. EXAM: CT HEAD WITHOUT CONTRAST CT CERVICAL SPINE WITHOUT CONTRAST TECHNIQUE: Multidetector CT imaging of the head and cervical spine was performed following the standard protocol without intravenous contrast. Multiplanar CT image reconstructions of the cervical spine were also generated. COMPARISON:  None. FINDINGS: CT HEAD FINDINGS Brain: No evidence of acute infarction, hemorrhage, hydrocephalus, extra-axial collection or mass lesion/mass effect. Vascular: Negative for hyperdense vessel Skull: Negative Sinuses/Orbits: Negative Other: None CT CERVICAL SPINE FINDINGS Alignment: 3 mm anterolisthesis C3-4. Mild anterolisthesis C2-3 and C7-T1 Skull base and vertebrae: Negative for fracture or  mass. Soft tissues and spinal canal: Negative for mass or soft tissue swelling. Disc levels: Multilevel disc and facet degeneration in the cervical spine. Mild left foraminal narrowing C3-4 due to spurring. Marked left foraminal narrowing C4-5 due to prominent uncinate spurring and facet hypertrophy. Associated spinal stenosis. Moderate spinal stenosis and moderate left foraminal stenosis at C5-6. Mild foraminal narrowing bilaterally C6-7. Bilateral facet degeneration at C7-T1. Upper chest: Apical scarring bilaterally Other: None IMPRESSION: Negative CT head Cervical spondylosis with spinal and foraminal stenosis as above. No acute cervical spine abnormality. Electronically Signed   By: Franchot Gallo M.D.   On: 08/10/2019 09:06    Assessment & Plan:   [name] was seen today for rash.  Diagnoses and all orders for this visit:  Allergic reaction to insect bite- She is having a localized allergic reaction, Will cont H1 and H2 blockers. Will start a topical steroid. -     clobetasol ointment (TEMOVATE) 0.05 %; Apply 1 application topically 2 (two) times daily.   I am having [name] start on clobetasol ointment. I am also having her maintain her Botulinum Toxin Type A (Cosm), tretinoin, fluocinonide cream, EpiPen 2-Pak, hyoscyamine, triamcinolone ointment, flecainide, urea, Vitamin B-12, Cefaly Kit, Cholecalciferol (VITAMIN D3 PO), ALPRAZolam, Premarin, UNABLE TO FIND, famotidine, SUMAtriptan, mometasone, loratadine, and Dexilant.  Meds ordered this encounter  Medications  . clobetasol ointment (TEMOVATE) 0.05 %    Sig: Apply 1 application topically 2 (two) times daily.    Dispense:  15 g    Refill:  0     Follow-up: Return if symptoms worsen or fail to improve.  Scarlette Calico, MD

## 2019-10-21 NOTE — Patient Instructions (Signed)
Insect Bite, Adult An insect bite can make your skin red, itchy, and swollen. An insect bite is different from an insect sting, which happens when an insect injects poison (venom) into the skin. Some insects can spread disease to people through a bite. However, most insect bites do not lead to disease and are not serious. What are the causes? Insects may bite for a variety of reasons, including:  Hunger.  To defend themselves. Insects that bite include:  Spiders.  Mosquitoes.  Ticks.  Fleas.  Ants.  Flies.  Kissing bugs.  Chiggers. What are the signs or symptoms? Symptoms of this condition include:  Itching or pain in the bite area.  Redness and swelling in the bite area.  An open wound (skin ulcer). In many cases, symptoms last for 2-4 days. In rare cases, a person may have a severe allergic reaction (anaphylactic reaction) to a bite. Symptoms of an anaphylactic reaction may include:  Feeling warm in the face (flushed). This may include redness.  Itchy, red, swollen areas of skin (hives).  Swelling of the eyes, lips, face, mouth, tongue, or throat.  Difficulty breathing, speaking, or swallowing.  Noisy breathing (wheezing).  Dizziness or light-headedness.  Fainting.  Pain or cramping in the abdomen.  Vomiting.  Diarrhea. How is this diagnosed? This condition is usually diagnosed based on symptoms and a physical exam. How is this treated? Treatment is usually not needed. Symptoms often go away on their own. When treatment is recommended, it may involve:  Applying a cream or lotion to the bite area. This treatment helps with itching.  Taking an antibiotic medicine. This treatment is needed if the bite area gets infected.  Getting a tetanus shot, if you are not up to date on this vaccine.  Applying ice to the affected area.  Allergy medicines called antihistamines. This treatment may be needed if you develop itching or an allergic reaction to the  insect bite.  Giving yourself an epinephrine injection if you have an anaphylactic reaction to a bite. To give the injection, you will use what is commonly called an auto-injector "pen" (pre-filled automatic epinephrine injection device). Your health care provider will teach you how to use an auto-injector pen. Follow these instructions at home: Bite area care   Do not scratch the bite area.  Keep the bite area clean and dry. Wash it every day with soap and water as told by your health care provider.  Check the bite area every day for signs of infection. Check for: ? Redness, swelling, or pain. ? Fluid or blood. ? Warmth. ? Pus or a bad smell. Managing pain, itching, and swelling   You may apply cortisone cream, calamine lotion, or a paste made of baking soda and water to the bite area as told by your health care provider.  If directed, put ice on the bite area. ? Put ice in a plastic bag. ? Place a towel between your skin and the bag. ? Leave the ice on for 20 minutes, 2-3 times a day. General instructions  Apply or take over-the-counter and prescription medicines only as told by your health care provider.  If you were prescribed an antibiotic medicine, take or apply it as told by your health care provider. Do not stop using the antibiotic even if your condition improves.  Keep all follow-up visits as told by your health care provider. This is important. How is this prevented? To help reduce your risk of insect bites:  When you are outdoors,   wear clothing that covers your arms and legs. This is especially important in the early morning and evening.  Use insect repellent. The best insect repellents contain DEET, picaridin, oil of lemon eucalyptus (OLE), or IR3535.  Consider spraying your clothing with a pesticide called permethrin. Permethrin helps prevent insect bites. It works for several weeks and for up to 5-6 clothing washes. Do not apply permethrin directly to the  skin.  If your home windows do not have screens, consider installing them.  If you will be sleeping in an area where there are mosquitoes, consider covering your sleeping area with a mosquito net. Contact a health care provider if:  You have redness, swelling, or pain in the bite area.  You have fluid or blood coming from the bite area.  The bite area feels warm to the touch.  You have pus or a bad smell coming from the bite area.  You have a fever. Get help right away if:  You have joint pain.  You have a rash.  You feel unusually tired or sleepy.  You have neck pain.  You have a headache.  You have unusual weakness.  You develop symptoms of an anaphylactic reaction. These may include: ? Flushed skin. ? Hives. ? Swelling of the eyes, lips, face, mouth, tongue, or throat. ? Difficulty breathing, speaking, or swallowing. ? Wheezing. ? Dizziness or light-headedness. ? Fainting. ? Pain or cramping in the abdomen. ? Vomiting. ? Diarrhea. These symptoms may represent a serious problem that is an emergency. Do not wait to see if the symptoms will go away. Do the following right away:  Use the auto-injector pen as you have been instructed.  Get medical help. Call your local emergency services (911 in the [place] Do not drive yourself to the hospital. Summary  An insect bite can make your skin red, itchy, and swollen.  Treatment is usually not needed. Symptoms often go away on their own. When treatment is recommended, it may involve taking medicine, applying medicine to the area, or applying ice.  Apply or take over-the-counter and prescription medicines only as told by your health care provider.  Use insect repellent to help prevent insect bites.  Contact a health care provider if you have any signs of infection in the bite area. This information is not intended to replace advice given to you by your health care provider. Make sure you discuss any questions you have  with your health care provider. Document Revised: 12/13/2017 Document Reviewed: 12/13/2017 Elsevier Patient Education  2020 [employer]  

## 2019-10-26 ENCOUNTER — Ambulatory Visit: Payer: Medicare Other | Admitting: Internal Medicine

## 2019-11-02 DIAGNOSIS — J3089 Other allergic rhinitis: Secondary | ICD-10-CM | POA: Diagnosis not present

## 2019-11-02 DIAGNOSIS — J301 Allergic rhinitis due to pollen: Secondary | ICD-10-CM | POA: Diagnosis not present

## 2019-11-04 DIAGNOSIS — H35361 Drusen (degenerative) of macula, right eye: Secondary | ICD-10-CM | POA: Diagnosis not present

## 2019-11-04 DIAGNOSIS — H2513 Age-related nuclear cataract, bilateral: Secondary | ICD-10-CM | POA: Diagnosis not present

## 2019-11-04 DIAGNOSIS — H43393 Other vitreous opacities, bilateral: Secondary | ICD-10-CM | POA: Diagnosis not present

## 2019-11-04 DIAGNOSIS — H43812 Vitreous degeneration, left eye: Secondary | ICD-10-CM | POA: Diagnosis not present

## 2019-11-09 DIAGNOSIS — J3089 Other allergic rhinitis: Secondary | ICD-10-CM | POA: Diagnosis not present

## 2019-11-09 DIAGNOSIS — J301 Allergic rhinitis due to pollen: Secondary | ICD-10-CM | POA: Diagnosis not present

## 2019-11-10 ENCOUNTER — Ambulatory Visit: Payer: Medicare Other | Admitting: Internal Medicine

## 2019-11-13 ENCOUNTER — Other Ambulatory Visit: Payer: Self-pay | Admitting: Internal Medicine

## 2019-11-13 ENCOUNTER — Other Ambulatory Visit (INDEPENDENT_AMBULATORY_CARE_PROVIDER_SITE_OTHER): Payer: Medicare Other

## 2019-11-13 DIAGNOSIS — R300 Dysuria: Secondary | ICD-10-CM | POA: Diagnosis not present

## 2019-11-13 LAB — URINALYSIS
Bilirubin Urine: NEGATIVE
Ketones, ur: NEGATIVE
Leukocytes,Ua: NEGATIVE
Nitrite: NEGATIVE
Specific Gravity, Urine: 1.025 (ref 1.000–1.030)
Total Protein, Urine: NEGATIVE
Urine Glucose: NEGATIVE
Urobilinogen, UA: 0.2 (ref 0.0–1.0)
pH: 5.5 (ref 5.0–8.0)

## 2019-11-17 ENCOUNTER — Ambulatory Visit: Payer: Medicare Other | Admitting: Internal Medicine

## 2019-11-19 DIAGNOSIS — Z86018 Personal history of other benign neoplasm: Secondary | ICD-10-CM | POA: Diagnosis not present

## 2019-11-19 DIAGNOSIS — L309 Dermatitis, unspecified: Secondary | ICD-10-CM | POA: Diagnosis not present

## 2019-11-19 DIAGNOSIS — D225 Melanocytic nevi of trunk: Secondary | ICD-10-CM | POA: Diagnosis not present

## 2019-11-19 DIAGNOSIS — D2230 Melanocytic nevi of unspecified part of face: Secondary | ICD-10-CM | POA: Diagnosis not present

## 2019-11-19 DIAGNOSIS — L821 Other seborrheic keratosis: Secondary | ICD-10-CM | POA: Diagnosis not present

## 2019-11-19 DIAGNOSIS — L578 Other skin changes due to chronic exposure to nonionizing radiation: Secondary | ICD-10-CM | POA: Diagnosis not present

## 2019-11-19 DIAGNOSIS — Z85828 Personal history of other malignant neoplasm of skin: Secondary | ICD-10-CM | POA: Diagnosis not present

## 2019-11-19 DIAGNOSIS — Z808 Family history of malignant neoplasm of other organs or systems: Secondary | ICD-10-CM | POA: Diagnosis not present

## 2019-11-20 DIAGNOSIS — J301 Allergic rhinitis due to pollen: Secondary | ICD-10-CM | POA: Diagnosis not present

## 2019-11-20 DIAGNOSIS — J3089 Other allergic rhinitis: Secondary | ICD-10-CM | POA: Diagnosis not present

## 2019-11-23 DIAGNOSIS — J301 Allergic rhinitis due to pollen: Secondary | ICD-10-CM | POA: Diagnosis not present

## 2019-11-23 DIAGNOSIS — J3089 Other allergic rhinitis: Secondary | ICD-10-CM | POA: Diagnosis not present

## 2019-12-01 ENCOUNTER — Telehealth: Payer: Self-pay | Admitting: Cardiovascular Disease

## 2019-12-01 MED ORDER — DILTIAZEM HCL 30 MG PO TABS: 30.0000 mg | ORAL_TABLET | Freq: Every day | ORAL | 1 refills | Status: DC | PRN

## 2019-12-01 NOTE — Telephone Encounter (Signed)
Pt called back and lied down and elevated feet for period of time and now heart rate is 83 and  Is feeling better .Adonis Housekeeper

## 2019-12-01 NOTE — Telephone Encounter (Signed)
    Pt called back and looking for previous nurse she spoke with. Transferred to [employer]

## 2019-12-01 NOTE — Telephone Encounter (Signed)
Patient has spoke to triage nurse.  Elevated legs, now HR back to normal.  I called her at this time.  She is feeling fine.  Discussed what to do if recurs; vagal exercises, flecainide, diltiazem, ED if sustained or symptomatic.  Pt appreciative for the call.

## 2019-12-01 NOTE — Telephone Encounter (Signed)
STAT if HR is under 50 or over 120 (normal HR is 60-100 beats per minute)  1) What is your heart rate? 166  2) Do you have a log of your heart rate readings (document readings)?   Fluctuated between 177-153 all within the past hour  3) Do you have any other symptoms? No  Pt states she has come in to do an EKG in the past. She tried to send an EKG from her Kardia mobile device via mychart message to Dr. Angelena Form.   She also states that when she does the Kardia readings it will tell her if she is in Tachycardia or not, but today it just said unspecified

## 2019-12-01 NOTE — Telephone Encounter (Signed)
Noted. Thanks.

## 2019-12-01 NOTE — Telephone Encounter (Signed)
Spoke with pt and for the last hour has elevated heart rate and has sent EKG via my chart. Also on  rhythm strip it has dx as unspecified and pt states normally it will read tachycardia Per pt has also has tried vaso vagal maneuver with no relief Pt has not tried taking the Flecainide either as has concerns with side effects Discussed with Dr Curt Bears and pt needs either go to ED or take the Flecainide in addition to short acting Diltiazem 30 mg and EKG was confirmed tachycardia./cy Pt aware of recommendations and agrees will call if need additional assistance .Will forward to Dr Angelena Form for review .Adonis Housekeeper

## 2019-12-16 ENCOUNTER — Ambulatory Visit: Payer: Medicare Other | Admitting: Internal Medicine

## 2019-12-17 ENCOUNTER — Encounter: Payer: Self-pay | Admitting: Internal Medicine

## 2019-12-17 ENCOUNTER — Other Ambulatory Visit: Payer: Self-pay

## 2019-12-17 ENCOUNTER — Ambulatory Visit: Payer: Medicare Other | Admitting: Internal Medicine

## 2019-12-17 ENCOUNTER — Ambulatory Visit (INDEPENDENT_AMBULATORY_CARE_PROVIDER_SITE_OTHER): Payer: Medicare Other | Admitting: Internal Medicine

## 2019-12-17 DIAGNOSIS — B009 Herpesviral infection, unspecified: Secondary | ICD-10-CM | POA: Diagnosis not present

## 2019-12-17 MED ORDER — VALACYCLOVIR HCL 500 MG PO TABS: 500.0000 mg | ORAL_TABLET | Freq: Two times a day (BID) | ORAL | 2 refills | Status: DC

## 2019-12-17 NOTE — Assessment & Plan Note (Addendum)
Three 1 mm gum lesions on the R upper gum Valtrex po Use [employer] Peroxicare tooth paste. Ultra soft toothbrush Alternate floss type

## 2019-12-17 NOTE — Patient Instructions (Signed)
Use [employer] Peroxicare tooth paste. Ultra soft toothbrush Alternate floss type

## 2019-12-17 NOTE — Progress Notes (Signed)
Subjective:  Patient ID: [name], female    DOB: 27-Mar-1951  Age: 69 y.o. MRN: [id number]  CC: No chief complaint on file.   HPI [name] presents for gum lesions on the R upper gum since Mon   Outpatient Medications Prior to Visit  Medication Sig Dispense Refill  . ALPRAZolam (XANAX) 0.25 MG tablet 0.5-1 po bid prn anxiety 30 tablet 1  . Botulinum Toxin Type A, Cosm, (BOTOX COSMETIC) 50 UNITS SOLR Inject 4-6 Units into the muscle. 3 month  injection    . Cholecalciferol (VITAMIN D3 PO) Take 50 mcg by mouth daily.    . clobetasol ointment (TEMOVATE) 9.82 % Apply 1 application topically 2 (two) times daily. 15 g 0  . conjugated estrogens (PREMARIN) vaginal cream Use PV once every 2-3 days 90 g 2  . Cyanocobalamin (VITAMIN B-12) 1000 MCG SUBL PLACE 1 TABLET (1,000 MCG TOTAL) UNDER THE TONGUE DAILY. 100 tablet 3  . dexlansoprazole (DEXILANT) 60 MG capsule Take 1 capsule (60 mg total) by mouth daily. 90 capsule 3  . dexlansoprazole (DEXILANT) 60 MG capsule Take 1 tablet by mouth daily.    [name] diltiazem (CARDIZEM) 30 MG tablet Take 1 tablet (30 mg total) by mouth daily as needed. 30 tablet 1  . EPIPEN 2-PAK 0.3 MG/0.3ML SOAJ injection See admin instructions. Reported on 10/08/2015  1  . famotidine (PEPCID) 40 MG tablet Take 1 tablet (40 mg total) by mouth daily. 90 tablet 3  . flecainide (TAMBOCOR) 50 MG tablet Take 50 mg by mouth as needed (take 1 tablet as needed for fast heart rate).    . fluocinonide cream (LIDEX) 6.41 % Apply 1 application topically 2 (two) times daily. 90 g 2  . hyoscyamine (LEVSIN SL) 0.125 MG SL tablet Place 1 tablet (0.125 mg total) under the tongue every 4 (four) hours as needed (for throat tightness). 30 tablet 3  . loratadine (CLARITIN) 10 MG tablet Take 1 tablet (10 mg total) by mouth daily. 90 tablet 3  . mometasone (NASONEX) 50 MCG/ACT nasal spray PLACE 2 SPRAYS INTO THE NOSE DAILY. 17 g 12  . Nerve Stimulator (CEFALY KIT) DEVI 1 Units by Does  not apply route 2 (two) times daily. 1 application 1  . SUMAtriptan (IMITREX) 50 MG tablet Please specify directions, refills and quantity 9 tablet 0  . tretinoin (RETIN-A) 0.05 % cream Apply 1 application topically at bedtime.    . triamcinolone ointment (KENALOG) 0.5 % Apply 1 application topically 2 (two) times daily. 30 g 0  . UNABLE TO FIND once a week. Med Name: allergy shots    . urea (CARMOL) 40 % CREA 1 APPLICATION AS NEEDED ONCE A DAY EXTERNALLY 30 DAYS    . valACYclovir (VALTREX) 500 MG tablet Take by mouth.     No facility-administered medications prior to visit.    ROS: Review of Systems  Constitutional: Negative for activity change, appetite change, chills, fatigue and unexpected weight change.  HENT: Negative for congestion, mouth sores and sinus pressure.   Eyes: Negative for visual disturbance.  Respiratory: Negative for cough and chest tightness.   Gastrointestinal: Negative for abdominal pain and nausea.  Genitourinary: Negative for difficulty urinating, frequency and vaginal pain.  Musculoskeletal: Negative for back pain and gait problem.  Skin: Negative for pallor and rash.  Neurological: Negative for dizziness, tremors, weakness, numbness and headaches.  Psychiatric/Behavioral: Negative for confusion and sleep disturbance.    Objective:  BP 130/78 (BP Location: Left Arm, Patient Position: Sitting, Cuff  Size: Small)   Pulse 78   Temp 98.6 F (37 C) (Oral)   Ht 5' 2"  (1.575 m)   Wt 101 lb (45.8 kg)   SpO2 97%   BMI 18.47 kg/m   BP Readings from Last 3 Encounters:  12/17/19 130/78  10/21/19 134/80  09/17/19 128/80    Wt Readings from Last 3 Encounters:  12/17/19 101 lb (45.8 kg)  10/21/19 100 lb (45.4 kg)  09/17/19 100 lb (45.4 kg)    Physical Exam Constitutional:      General: She is not in acute distress.    Appearance: She is well-developed.  HENT:     Head: Normocephalic.     Right Ear: External ear normal.     Left Ear: External ear  normal.     Nose: Nose normal.  Eyes:     General:        Right eye: No discharge.        Left eye: No discharge.     Conjunctiva/sclera: Conjunctivae normal.     Pupils: Pupils are equal, round, and reactive to light.  Neck:     Thyroid: No thyromegaly.     Vascular: No JVD.     Trachea: No tracheal deviation.  Cardiovascular:     Rate and Rhythm: Normal rate and regular rhythm.     Heart sounds: Normal heart sounds.  Pulmonary:     Effort: No respiratory distress.     Breath sounds: No stridor. No wheezing.  Abdominal:     General: Bowel sounds are normal. There is no distension.     Palpations: Abdomen is soft. There is no mass.     Tenderness: There is no abdominal tenderness. There is no guarding or rebound.  Musculoskeletal:        General: No tenderness.     Cervical back: Normal [name] of motion and neck supple.  Lymphadenopathy:     Cervical: No cervical adenopathy.  Skin:    Findings: No erythema or rash.  Neurological:     Cranial Nerves: No cranial nerve deficit.     Motor: No abnormal muscle tone.     Coordination: Coordination normal.     Deep Tendon Reflexes: Reflexes normal.  Psychiatric:        Behavior: Behavior normal.        Thought Content: Thought content normal.        Judgment: Judgment normal.    Three 1 mm gum lesions on the R upper gum  Lab Results  Component Value Date   WBC 4.1 01/13/2019   HGB 13.4 01/13/2019   HCT 40.0 01/13/2019   PLT 190.0 01/13/2019   GLUCOSE 90 01/13/2019   CHOL 213 (H) 01/13/2019   TRIG 113.0 01/13/2019   HDL 73.10 01/13/2019   LDLCALC 117 (H) 01/13/2019   ALT 14 01/13/2019   AST 16 01/13/2019   NA 140 01/13/2019   K 4.1 01/13/2019   CL 103 01/13/2019   CREATININE 0.78 01/13/2019   BUN 18 01/13/2019   CO2 29 01/13/2019   TSH 2.01 01/13/2019    CT Head Wo Contrast  Result Date: 08/10/2019 CLINICAL DATA:  Complicated headache syndrome.  Neck pain. EXAM: CT HEAD WITHOUT CONTRAST CT CERVICAL SPINE  WITHOUT CONTRAST TECHNIQUE: Multidetector CT imaging of the head and cervical spine was performed following the standard protocol without intravenous contrast. Multiplanar CT image reconstructions of the cervical spine were also generated. COMPARISON:  None. FINDINGS: CT HEAD FINDINGS Brain: No evidence of acute  infarction, hemorrhage, hydrocephalus, extra-axial collection or mass lesion/mass effect. Vascular: Negative for hyperdense vessel Skull: Negative Sinuses/Orbits: Negative Other: None CT CERVICAL SPINE FINDINGS Alignment: 3 mm anterolisthesis C3-4. Mild anterolisthesis C2-3 and C7-T1 Skull base and vertebrae: Negative for fracture or mass. Soft tissues and spinal canal: Negative for mass or soft tissue swelling. Disc levels: Multilevel disc and facet degeneration in the cervical spine. Mild left foraminal narrowing C3-4 due to spurring. Marked left foraminal narrowing C4-5 due to prominent uncinate spurring and facet hypertrophy. Associated spinal stenosis. Moderate spinal stenosis and moderate left foraminal stenosis at C5-6. Mild foraminal narrowing bilaterally C6-7. Bilateral facet degeneration at C7-T1. Upper chest: Apical scarring bilaterally Other: None IMPRESSION: Negative CT head Cervical spondylosis with spinal and foraminal stenosis as above. No acute cervical spine abnormality. Electronically Signed   By: Franchot Gallo M.D.   On: 08/10/2019 09:06   CT CERVICAL SPINE WO CONTRAST  Result Date: 08/10/2019 CLINICAL DATA:  Complicated headache syndrome.  Neck pain. EXAM: CT HEAD WITHOUT CONTRAST CT CERVICAL SPINE WITHOUT CONTRAST TECHNIQUE: Multidetector CT imaging of the head and cervical spine was performed following the standard protocol without intravenous contrast. Multiplanar CT image reconstructions of the cervical spine were also generated. COMPARISON:  None. FINDINGS: CT HEAD FINDINGS Brain: No evidence of acute infarction, hemorrhage, hydrocephalus, extra-axial collection or mass  lesion/mass effect. Vascular: Negative for hyperdense vessel Skull: Negative Sinuses/Orbits: Negative Other: None CT CERVICAL SPINE FINDINGS Alignment: 3 mm anterolisthesis C3-4. Mild anterolisthesis C2-3 and C7-T1 Skull base and vertebrae: Negative for fracture or mass. Soft tissues and spinal canal: Negative for mass or soft tissue swelling. Disc levels: Multilevel disc and facet degeneration in the cervical spine. Mild left foraminal narrowing C3-4 due to spurring. Marked left foraminal narrowing C4-5 due to prominent uncinate spurring and facet hypertrophy. Associated spinal stenosis. Moderate spinal stenosis and moderate left foraminal stenosis at C5-6. Mild foraminal narrowing bilaterally C6-7. Bilateral facet degeneration at C7-T1. Upper chest: Apical scarring bilaterally Other: None IMPRESSION: Negative CT head Cervical spondylosis with spinal and foraminal stenosis as above. No acute cervical spine abnormality. Electronically Signed   By: Franchot Gallo M.D.   On: 08/10/2019 09:06    Assessment & Plan:     Follow-up: No follow-ups on file.  [name], MD

## 2019-12-30 DIAGNOSIS — J3089 Other allergic rhinitis: Secondary | ICD-10-CM | POA: Diagnosis not present

## 2019-12-30 DIAGNOSIS — J301 Allergic rhinitis due to pollen: Secondary | ICD-10-CM | POA: Diagnosis not present

## 2020-01-01 ENCOUNTER — Telehealth: Payer: Self-pay | Admitting: Internal Medicine

## 2020-01-01 DIAGNOSIS — G43109 Migraine with aura, not intractable, without status migrainosus: Secondary | ICD-10-CM

## 2020-01-01 DIAGNOSIS — R55 Syncope and collapse: Secondary | ICD-10-CM

## 2020-01-01 DIAGNOSIS — E785 Hyperlipidemia, unspecified: Secondary | ICD-10-CM

## 2020-01-01 NOTE — Telephone Encounter (Signed)
New message:   Pt is calling and would like orders put into the system before her next appt. Please advise.

## 2020-01-03 ENCOUNTER — Telehealth: Payer: Self-pay | Admitting: Internal Medicine

## 2020-01-03 NOTE — Telephone Encounter (Signed)
Okay.  Done.  Thanks 

## 2020-01-08 DIAGNOSIS — J301 Allergic rhinitis due to pollen: Secondary | ICD-10-CM | POA: Diagnosis not present

## 2020-01-08 DIAGNOSIS — J3089 Other allergic rhinitis: Secondary | ICD-10-CM | POA: Diagnosis not present

## 2020-01-11 ENCOUNTER — Other Ambulatory Visit: Payer: Self-pay

## 2020-01-11 ENCOUNTER — Other Ambulatory Visit: Payer: Medicare Other

## 2020-01-11 DIAGNOSIS — J3089 Other allergic rhinitis: Secondary | ICD-10-CM | POA: Diagnosis not present

## 2020-01-11 DIAGNOSIS — E785 Hyperlipidemia, unspecified: Secondary | ICD-10-CM

## 2020-01-11 DIAGNOSIS — G43109 Migraine with aura, not intractable, without status migrainosus: Secondary | ICD-10-CM | POA: Diagnosis not present

## 2020-01-11 DIAGNOSIS — J301 Allergic rhinitis due to pollen: Secondary | ICD-10-CM | POA: Diagnosis not present

## 2020-01-11 DIAGNOSIS — R55 Syncope and collapse: Secondary | ICD-10-CM | POA: Diagnosis not present

## 2020-01-12 LAB — CBC WITH DIFFERENTIAL/PLATELET
Absolute Monocytes: 291 cells/uL (ref 200–950)
Basophils Absolute: 32 cells/uL (ref 0–200)
Basophils Relative: 0.9 %
Eosinophils Absolute: 91 cells/uL (ref 15–500)
Eosinophils Relative: 2.6 %
HCT: 39 % (ref 35.0–45.0)
Hemoglobin: 13.1 g/dL (ref 11.7–15.5)
Lymphs Abs: 1460 cells/uL (ref 850–3900)
MCH: 31.8 pg (ref 27.0–33.0)
MCHC: 33.6 g/dL (ref 32.0–36.0)
MCV: 94.7 fL (ref 80.0–100.0)
MPV: 11.2 fL (ref 7.5–12.5)
Monocytes Relative: 8.3 %
Neutro Abs: 1628 cells/uL (ref 1500–7800)
Neutrophils Relative %: 46.5 %
Platelets: 188 10*3/uL (ref 140–400)
RBC: 4.12 10*6/uL (ref 3.80–5.10)
RDW: 11.9 % (ref 11.0–15.0)
Total Lymphocyte: 41.7 %
WBC: 3.5 10*3/uL — ABNORMAL LOW (ref 3.8–10.8)

## 2020-01-12 LAB — BASIC METABOLIC PANEL
BUN: 25 mg/dL (ref 7–25)
CO2: 29 mmol/L (ref 20–32)
Calcium: 9.6 mg/dL (ref 8.6–10.4)
Chloride: 104 mmol/L (ref 98–110)
Creat: 0.83 mg/dL (ref 0.50–0.99)
Glucose, Bld: 92 mg/dL (ref 65–99)
Potassium: 4.2 mmol/L (ref 3.5–5.3)
Sodium: 140 mmol/L (ref 135–146)

## 2020-01-12 LAB — URINALYSIS
Bilirubin Urine: NEGATIVE
Glucose, UA: NEGATIVE
Hgb urine dipstick: NEGATIVE
Ketones, ur: NEGATIVE
Leukocytes,Ua: NEGATIVE
Nitrite: NEGATIVE
Protein, ur: NEGATIVE
Specific Gravity, Urine: 1.019 (ref 1.001–1.03)
pH: 6 (ref 5.0–8.0)

## 2020-01-12 LAB — LIPID PANEL
Cholesterol: 208 mg/dL — ABNORMAL HIGH (ref <200)
HDL: 75 mg/dL (ref >=50)
LDL Cholesterol (Calc): 115 mg/dL (calc) — ABNORMAL HIGH
Non-HDL Cholesterol (Calc): 133 mg/dL (calc) — ABNORMAL HIGH (ref <130)
Total CHOL/HDL Ratio: 2.8 (calc) (ref <5.0)
Triglycerides: 81 mg/dL (ref <150)

## 2020-01-12 LAB — HEPATIC FUNCTION PANEL
AG Ratio: 1.8 (calc) (ref 1.0–2.5)
ALT: 19 U/L (ref 6–29)
AST: 21 U/L (ref 10–35)
Albumin: 4.5 g/dL (ref 3.6–5.1)
Alkaline phosphatase (APISO): 62 U/L (ref 37–153)
Bilirubin, Direct: 0.1 mg/dL (ref 0.0–0.2)
Globulin: 2.5 g/dL (calc) (ref 1.9–3.7)
Indirect Bilirubin: 0.6 mg/dL (calc) (ref 0.2–1.2)
Total Bilirubin: 0.7 mg/dL (ref 0.2–1.2)
Total Protein: 7 g/dL (ref 6.1–8.1)

## 2020-01-12 LAB — TSH: TSH: 2.14 mIU/L (ref 0.40–4.50)

## 2020-01-19 ENCOUNTER — Ambulatory Visit (INDEPENDENT_AMBULATORY_CARE_PROVIDER_SITE_OTHER): Payer: Medicare Other | Admitting: Internal Medicine

## 2020-01-19 ENCOUNTER — Encounter: Payer: Self-pay | Admitting: Internal Medicine

## 2020-01-19 ENCOUNTER — Other Ambulatory Visit: Payer: Self-pay

## 2020-01-19 VITALS — BP 122/88 | HR 78 | Temp 98.5°F | Ht 62.0 in | Wt 101.0 lb

## 2020-01-19 DIAGNOSIS — E538 Deficiency of other specified B group vitamins: Secondary | ICD-10-CM

## 2020-01-19 DIAGNOSIS — G43109 Migraine with aura, not intractable, without status migrainosus: Secondary | ICD-10-CM

## 2020-01-19 DIAGNOSIS — R922 Inconclusive mammogram: Secondary | ICD-10-CM | POA: Diagnosis not present

## 2020-01-19 DIAGNOSIS — D72819 Decreased white blood cell count, unspecified: Secondary | ICD-10-CM | POA: Diagnosis not present

## 2020-01-19 DIAGNOSIS — Z0000 Encounter for general adult medical examination without abnormal findings: Secondary | ICD-10-CM

## 2020-01-19 DIAGNOSIS — K219 Gastro-esophageal reflux disease without esophagitis: Secondary | ICD-10-CM

## 2020-01-19 DIAGNOSIS — N952 Postmenopausal atrophic vaginitis: Secondary | ICD-10-CM | POA: Diagnosis not present

## 2020-01-19 MED ORDER — MOMETASONE FUROATE 50 MCG/ACT NA SUSP: 2.0000 | Freq: Every day | NASAL | 12 refills | Status: DC

## 2020-01-19 MED ORDER — VITAMIN B-12 1000 MCG SL SUBL: SUBLINGUAL_TABLET | SUBLINGUAL | 3 refills | Status: DC

## 2020-01-19 MED ORDER — PREMARIN 0.625 MG/GM VA CREA: TOPICAL_CREAM | VAGINAL | 2 refills | Status: DC

## 2020-01-19 NOTE — Assessment & Plan Note (Signed)
We discussed age appropriate health related issues, including available/recomended screening tests and vaccinations. We discussed a need for adhering to healthy diet and exercise. Labs/EKG were reviewed/ordered. All questions were answered. Mammo q 12 mo Eye appt q 12 mo

## 2020-01-19 NOTE — Assessment & Plan Note (Signed)
Mammo/US

## 2020-01-19 NOTE — Assessment & Plan Note (Signed)
Dexilant 

## 2020-01-19 NOTE — Progress Notes (Signed)
Subjective:  Patient ID: [name], female    DOB: 04/30/1951  Age: 69 y.o. MRN: [id number]  CC: No chief complaint on file.   HPI [name] presents for yearly f/u - allergies, B12 def, atrophic vaginitis   Outpatient Medications Prior to Visit  Medication Sig Dispense Refill  . ALPRAZolam (XANAX) 0.25 MG tablet 0.5-1 po bid prn anxiety 30 tablet 1  . Botulinum Toxin Type A, Cosm, (BOTOX COSMETIC) 50 UNITS SOLR Inject 4-6 Units into the muscle. 3 month  injection    . Cholecalciferol (VITAMIN D3 PO) Take 50 mcg by mouth daily.    . clobetasol ointment (TEMOVATE) 7.85 % Apply 1 application topically 2 (two) times daily. 15 g 0  . conjugated estrogens (PREMARIN) vaginal cream Use PV once every 2-3 days 90 g 2  . Cyanocobalamin (VITAMIN B-12) 1000 MCG SUBL PLACE 1 TABLET (1,000 MCG TOTAL) UNDER THE TONGUE DAILY. 100 tablet 3  . dexlansoprazole (DEXILANT) 60 MG capsule Take 1 capsule (60 mg total) by mouth daily. 90 capsule 3  . dexlansoprazole (DEXILANT) 60 MG capsule Take 1 tablet by mouth daily.    [name] diltiazem (CARDIZEM) 30 MG tablet Take 1 tablet (30 mg total) by mouth daily as needed. 30 tablet 1  . EPIPEN 2-PAK 0.3 MG/0.3ML SOAJ injection See admin instructions. Reported on 10/08/2015  1  . famotidine (PEPCID) 40 MG tablet Take 1 tablet (40 mg total) by mouth daily. 90 tablet 3  . flecainide (TAMBOCOR) 50 MG tablet Take 50 mg by mouth as needed (take 1 tablet as needed for fast heart rate).    . fluocinonide cream (LIDEX) 8.85 % Apply 1 application topically 2 (two) times daily. 90 g 2  . hyoscyamine (LEVSIN SL) 0.125 MG SL tablet Place 1 tablet (0.125 mg total) under the tongue every 4 (four) hours as needed (for throat tightness). 30 tablet 3  . loratadine (CLARITIN) 10 MG tablet Take 1 tablet (10 mg total) by mouth daily. 90 tablet 3  . mometasone (NASONEX) 50 MCG/ACT nasal spray PLACE 2 SPRAYS INTO THE NOSE DAILY. 17 g 12  . Nerve Stimulator (CEFALY KIT) DEVI 1  Units by Does not apply route 2 (two) times daily. 1 application 1  . SUMAtriptan (IMITREX) 50 MG tablet Please specify directions, refills and quantity 9 tablet 0  . tretinoin (RETIN-A) 0.05 % cream Apply 1 application topically at bedtime.    . triamcinolone ointment (KENALOG) 0.5 % Apply 1 application topically 2 (two) times daily. 30 g 0  . UNABLE TO FIND once a week. Med Name: allergy shots    . urea (CARMOL) 40 % CREA 1 APPLICATION AS NEEDED ONCE A DAY EXTERNALLY 30 DAYS    . valACYclovir (VALTREX) 500 MG tablet Take 1 tablet (500 mg total) by mouth 2 (two) times daily. 10 tablet 2   No facility-administered medications prior to visit.    ROS: Review of Systems  Constitutional: Negative for activity change, appetite change, chills, fatigue and unexpected weight change.  HENT: Negative for congestion, mouth sores and sinus pressure.   Eyes: Negative for visual disturbance.  Respiratory: Negative for cough and chest tightness.   Gastrointestinal: Negative for abdominal pain and nausea.  Genitourinary: Negative for difficulty urinating, frequency and vaginal pain.  Musculoskeletal: Negative for back pain and gait problem.  Skin: Negative for pallor and rash.  Neurological: Negative for dizziness, tremors, weakness, numbness and headaches.  Psychiatric/Behavioral: Negative for confusion and sleep disturbance.    Objective:  BP  122/88 (BP Location: Left Arm, Patient Position: Sitting, Cuff Size: Normal)   Pulse 78   Temp 98.5 F (36.9 C) (Oral)   Ht 5' 2"  (1.575 m)   Wt 101 lb (45.8 kg)   SpO2 97%   BMI 18.47 kg/m   BP Readings from Last 3 Encounters:  01/19/20 122/88  12/17/19 130/78  10/21/19 134/80    Wt Readings from Last 3 Encounters:  01/19/20 101 lb (45.8 kg)  12/17/19 101 lb (45.8 kg)  10/21/19 100 lb (45.4 kg)    Physical Exam Constitutional:      General: She is not in acute distress.    Appearance: She is well-developed.  HENT:     Head: Normocephalic.      Right Ear: External ear normal.     Left Ear: External ear normal.     Nose: Nose normal.  Eyes:     General:        Right eye: No discharge.        Left eye: No discharge.     Conjunctiva/sclera: Conjunctivae normal.     Pupils: Pupils are equal, round, and reactive to light.  Neck:     Thyroid: No thyromegaly.     Vascular: No JVD.     Trachea: No tracheal deviation.  Cardiovascular:     Rate and Rhythm: Normal rate and regular rhythm.     Heart sounds: Normal heart sounds.  Pulmonary:     Effort: No respiratory distress.     Breath sounds: No stridor. No wheezing.  Abdominal:     General: Bowel sounds are normal. There is no distension.     Palpations: Abdomen is soft. There is no mass.     Tenderness: There is no abdominal tenderness. There is no guarding or rebound.  Musculoskeletal:        General: No tenderness.     Cervical back: Normal [name] of motion and neck supple.  Lymphadenopathy:     Cervical: No cervical adenopathy.  Skin:    Findings: No erythema or rash.  Neurological:     Mental Status: She is oriented to person, place, and time.     Cranial Nerves: No cranial nerve deficit.     Motor: No abnormal muscle tone.     Coordination: Coordination normal.     Deep Tendon Reflexes: Reflexes normal.  Psychiatric:        Behavior: Behavior normal.        Thought Content: Thought content normal.        Judgment: Judgment normal.    Breasts B fibrocystic changes w/o discrete mass   Lab Results  Component Value Date   WBC 3.5 (L) 01/11/2020   HGB 13.1 01/11/2020   HCT 39.0 01/11/2020   PLT 188 01/11/2020   GLUCOSE 92 01/11/2020   CHOL 208 (H) 01/11/2020   TRIG 81 01/11/2020   HDL 75 01/11/2020   LDLCALC 115 (H) 01/11/2020   ALT 19 01/11/2020   AST 21 01/11/2020   NA 140 01/11/2020   K 4.2 01/11/2020   CL 104 01/11/2020   CREATININE 0.83 01/11/2020   BUN 25 01/11/2020   CO2 29 01/11/2020   TSH 2.14 01/11/2020    CT Head Wo  Contrast  Result Date: 08/10/2019 CLINICAL DATA:  Complicated headache syndrome.  Neck pain. EXAM: CT HEAD WITHOUT CONTRAST CT CERVICAL SPINE WITHOUT CONTRAST TECHNIQUE: Multidetector CT imaging of the head and cervical spine was performed following the standard protocol without intravenous contrast. Multiplanar  CT image reconstructions of the cervical spine were also generated. COMPARISON:  None. FINDINGS: CT HEAD FINDINGS Brain: No evidence of acute infarction, hemorrhage, hydrocephalus, extra-axial collection or mass lesion/mass effect. Vascular: Negative for hyperdense vessel Skull: Negative Sinuses/Orbits: Negative Other: None CT CERVICAL SPINE FINDINGS Alignment: 3 mm anterolisthesis C3-4. Mild anterolisthesis C2-3 and C7-T1 Skull base and vertebrae: Negative for fracture or mass. Soft tissues and spinal canal: Negative for mass or soft tissue swelling. Disc levels: Multilevel disc and facet degeneration in the cervical spine. Mild left foraminal narrowing C3-4 due to spurring. Marked left foraminal narrowing C4-5 due to prominent uncinate spurring and facet hypertrophy. Associated spinal stenosis. Moderate spinal stenosis and moderate left foraminal stenosis at C5-6. Mild foraminal narrowing bilaterally C6-7. Bilateral facet degeneration at C7-T1. Upper chest: Apical scarring bilaterally Other: None IMPRESSION: Negative CT head Cervical spondylosis with spinal and foraminal stenosis as above. No acute cervical spine abnormality. Electronically Signed   By: Franchot Gallo M.D.   On: 08/10/2019 09:06   CT CERVICAL SPINE WO CONTRAST  Result Date: 08/10/2019 CLINICAL DATA:  Complicated headache syndrome.  Neck pain. EXAM: CT HEAD WITHOUT CONTRAST CT CERVICAL SPINE WITHOUT CONTRAST TECHNIQUE: Multidetector CT imaging of the head and cervical spine was performed following the standard protocol without intravenous contrast. Multiplanar CT image reconstructions of the cervical spine were also generated.  COMPARISON:  None. FINDINGS: CT HEAD FINDINGS Brain: No evidence of acute infarction, hemorrhage, hydrocephalus, extra-axial collection or mass lesion/mass effect. Vascular: Negative for hyperdense vessel Skull: Negative Sinuses/Orbits: Negative Other: None CT CERVICAL SPINE FINDINGS Alignment: 3 mm anterolisthesis C3-4. Mild anterolisthesis C2-3 and C7-T1 Skull base and vertebrae: Negative for fracture or mass. Soft tissues and spinal canal: Negative for mass or soft tissue swelling. Disc levels: Multilevel disc and facet degeneration in the cervical spine. Mild left foraminal narrowing C3-4 due to spurring. Marked left foraminal narrowing C4-5 due to prominent uncinate spurring and facet hypertrophy. Associated spinal stenosis. Moderate spinal stenosis and moderate left foraminal stenosis at C5-6. Mild foraminal narrowing bilaterally C6-7. Bilateral facet degeneration at C7-T1. Upper chest: Apical scarring bilaterally Other: None IMPRESSION: Negative CT head Cervical spondylosis with spinal and foraminal stenosis as above. No acute cervical spine abnormality. Electronically Signed   By: Franchot Gallo M.D.   On: 08/10/2019 09:06    Assessment & Plan:     Follow-up: No follow-ups on file.  [name], MD

## 2020-01-19 NOTE — Assessment & Plan Note (Signed)
No relapse Imitrex prn

## 2020-01-20 DIAGNOSIS — J301 Allergic rhinitis due to pollen: Secondary | ICD-10-CM | POA: Diagnosis not present

## 2020-01-20 DIAGNOSIS — J3081 Allergic rhinitis due to animal (cat) (dog) hair and dander: Secondary | ICD-10-CM | POA: Diagnosis not present

## 2020-01-21 ENCOUNTER — Telehealth: Payer: Self-pay | Admitting: Family Medicine

## 2020-01-21 NOTE — Telephone Encounter (Signed)
Pt states

## 2020-01-21 NOTE — Telephone Encounter (Signed)
Patient notified through My Chart

## 2020-01-21 NOTE — Telephone Encounter (Signed)
Pt states that at one of her last visits with [place], Dr. Tamala Julian recommended a female Rheumatologist in [place], but she does not remember her name. Could we provide her with this info again?

## 2020-01-27 NOTE — Telephone Encounter (Signed)
Pt contacted Lorenza Cambridge per our recommendation, but she is no longer taking New Patients. Her office referred patient to Sampson Si at [facility] at [place], pt wants to know if Dr. Tamala Julian is familiar and would recommend this provider.

## 2020-01-28 NOTE — Telephone Encounter (Signed)
Do not know personally but looking into it does appear a good option

## 2020-01-28 NOTE — Telephone Encounter (Signed)
Informed pt of provider response via VM.

## 2020-01-29 DIAGNOSIS — J301 Allergic rhinitis due to pollen: Secondary | ICD-10-CM | POA: Diagnosis not present

## 2020-01-29 DIAGNOSIS — J3089 Other allergic rhinitis: Secondary | ICD-10-CM | POA: Diagnosis not present

## 2020-02-01 DIAGNOSIS — R768 Other specified abnormal immunological findings in serum: Secondary | ICD-10-CM | POA: Diagnosis not present

## 2020-02-01 DIAGNOSIS — Z885 Allergy status to narcotic agent status: Secondary | ICD-10-CM | POA: Diagnosis not present

## 2020-02-01 DIAGNOSIS — Z882 Allergy status to sulfonamides status: Secondary | ICD-10-CM | POA: Diagnosis not present

## 2020-02-01 DIAGNOSIS — Z881 Allergy status to other antibiotic agents status: Secondary | ICD-10-CM | POA: Diagnosis not present

## 2020-02-01 DIAGNOSIS — M2550 Pain in unspecified joint: Secondary | ICD-10-CM | POA: Diagnosis not present

## 2020-02-01 DIAGNOSIS — M542 Cervicalgia: Secondary | ICD-10-CM | POA: Diagnosis not present

## 2020-02-01 DIAGNOSIS — Z880 Allergy status to penicillin: Secondary | ICD-10-CM | POA: Diagnosis not present

## 2020-02-01 DIAGNOSIS — Z888 Allergy status to other drugs, medicaments and biological substances status: Secondary | ICD-10-CM | POA: Diagnosis not present

## 2020-02-03 DIAGNOSIS — J3089 Other allergic rhinitis: Secondary | ICD-10-CM | POA: Diagnosis not present

## 2020-02-03 DIAGNOSIS — J301 Allergic rhinitis due to pollen: Secondary | ICD-10-CM | POA: Diagnosis not present

## 2020-02-08 DIAGNOSIS — J301 Allergic rhinitis due to pollen: Secondary | ICD-10-CM | POA: Diagnosis not present

## 2020-02-08 DIAGNOSIS — J3089 Other allergic rhinitis: Secondary | ICD-10-CM | POA: Diagnosis not present

## 2020-02-10 DIAGNOSIS — H43393 Other vitreous opacities, bilateral: Secondary | ICD-10-CM | POA: Diagnosis not present

## 2020-02-10 DIAGNOSIS — H2513 Age-related nuclear cataract, bilateral: Secondary | ICD-10-CM | POA: Diagnosis not present

## 2020-02-10 DIAGNOSIS — D492 Neoplasm of unspecified behavior of bone, soft tissue, and skin: Secondary | ICD-10-CM | POA: Diagnosis not present

## 2020-02-10 DIAGNOSIS — H43812 Vitreous degeneration, left eye: Secondary | ICD-10-CM | POA: Diagnosis not present

## 2020-02-10 DIAGNOSIS — H35361 Drusen (degenerative) of macula, right eye: Secondary | ICD-10-CM | POA: Diagnosis not present

## 2020-02-12 ENCOUNTER — Encounter: Payer: Self-pay | Admitting: Family Medicine

## 2020-02-15 DIAGNOSIS — J3089 Other allergic rhinitis: Secondary | ICD-10-CM | POA: Diagnosis not present

## 2020-02-15 DIAGNOSIS — J301 Allergic rhinitis due to pollen: Secondary | ICD-10-CM | POA: Diagnosis not present

## 2020-02-16 NOTE — Telephone Encounter (Signed)
Pt called this morning checking to see if you have been able to respond to her mychart msg.

## 2020-02-17 ENCOUNTER — Ambulatory Visit (INDEPENDENT_AMBULATORY_CARE_PROVIDER_SITE_OTHER): Payer: Medicare Other

## 2020-02-17 ENCOUNTER — Encounter: Payer: Self-pay | Admitting: Internal Medicine

## 2020-02-17 ENCOUNTER — Ambulatory Visit (INDEPENDENT_AMBULATORY_CARE_PROVIDER_SITE_OTHER): Payer: Medicare Other | Admitting: Internal Medicine

## 2020-02-17 ENCOUNTER — Other Ambulatory Visit: Payer: Self-pay

## 2020-02-17 VITALS — BP 132/84 | HR 82 | Temp 99.0°F | Resp 16 | Ht 62.0 in | Wt 101.0 lb

## 2020-02-17 DIAGNOSIS — E538 Deficiency of other specified B group vitamins: Secondary | ICD-10-CM | POA: Diagnosis not present

## 2020-02-17 DIAGNOSIS — D72819 Decreased white blood cell count, unspecified: Secondary | ICD-10-CM

## 2020-02-17 DIAGNOSIS — R0789 Other chest pain: Secondary | ICD-10-CM

## 2020-02-17 DIAGNOSIS — M546 Pain in thoracic spine: Secondary | ICD-10-CM

## 2020-02-17 DIAGNOSIS — K219 Gastro-esophageal reflux disease without esophagitis: Secondary | ICD-10-CM

## 2020-02-17 LAB — TROPONIN I (HIGH SENSITIVITY): High Sens Troponin I: 2 ng/L (ref 2–17)

## 2020-02-17 NOTE — Progress Notes (Addendum)
Subjective:  Patient ID: [name], female    DOB: 09-Aug-1950  Age: 69 y.o. MRN: [id number]  CC: Back Pain  This visit occurred during the SARS-CoV-2 public health emergency.  Safety protocols were in place, including screening questions prior to the visit, additional usage of staff PPE, and extensive cleaning of exam room while observing appropriate contact time as indicated for disinfecting solutions.    HPI Fidelis Loth presents for concerns about a 3-day history of pain in her mid thoracic spine to the left side of midline.  She denies any specific trauma or injury.  She says the pain is exacerbated by movement and deep breath.  She says the pain sometimes radiates anteriorly.  She denies cough, shortness of breath, hemoptysis, or wheezing.  She has gotten some relief with Motrin.  She is worried about a history of low white blood cell count and wants to have this rechecked.  She is taking a PPI for GERD.  She denies any recent episodes of heartburn, indigestion, odynophagia, or dysphagia.  Outpatient Medications Prior to Visit  Medication Sig Dispense Refill  . ALPRAZolam (XANAX) 0.25 MG tablet 0.5-1 po bid prn anxiety 30 tablet 1  . Botulinum Toxin Type A, Cosm, (BOTOX COSMETIC) 50 UNITS SOLR Inject 4-6 Units into the muscle. 3 month  injection    . Cholecalciferol (VITAMIN D3 PO) Take 50 mcg by mouth daily.    . clobetasol ointment (TEMOVATE) 7.65 % Apply 1 application topically 2 (two) times daily. 15 g 0  . conjugated estrogens (PREMARIN) vaginal cream Use PV once every 2-3 days 90 g 2  . Cyanocobalamin (VITAMIN B-12) 1000 MCG SUBL PLACE 1 TABLET (1,000 MCG TOTAL) UNDER THE TONGUE DAILY. 100 tablet 3  . dexlansoprazole (DEXILANT) 60 MG capsule Take 1 capsule (60 mg total) by mouth daily. 90 capsule 3  . diltiazem (CARDIZEM) 30 MG tablet Take 1 tablet (30 mg total) by mouth daily as needed. 30 tablet 1  . EPIPEN 2-PAK 0.3 MG/0.3ML SOAJ injection See admin instructions.  Reported on 10/08/2015  1  . famotidine (PEPCID) 40 MG tablet Take 1 tablet (40 mg total) by mouth daily. 90 tablet 3  . flecainide (TAMBOCOR) 50 MG tablet Take 50 mg by mouth as needed (take 1 tablet as needed for fast heart rate).    . fluocinonide cream (LIDEX) 4.65 % Apply 1 application topically 2 (two) times daily. 90 g 2  . hyoscyamine (LEVSIN SL) 0.125 MG SL tablet Place 1 tablet (0.125 mg total) under the tongue every 4 (four) hours as needed (for throat tightness). 30 tablet 3  . loratadine (CLARITIN) 10 MG tablet Take 1 tablet (10 mg total) by mouth daily. 90 tablet 3  . mometasone (NASONEX) 50 MCG/ACT nasal spray Place 2 sprays into the nose daily. 17 g 12  . Nerve Stimulator (CEFALY KIT) DEVI 1 Units by Does not apply route 2 (two) times daily. 1 application 1  . SUMAtriptan (IMITREX) 50 MG tablet Please specify directions, refills and quantity 9 tablet 0  . tretinoin (RETIN-A) 0.05 % cream Apply 1 application topically at bedtime.    . triamcinolone ointment (KENALOG) 0.5 % Apply 1 application topically 2 (two) times daily. 30 g 0  . UNABLE TO FIND once a week. Med Name: allergy shots    . urea (CARMOL) 40 % CREA 1 APPLICATION AS NEEDED ONCE A DAY EXTERNALLY 30 DAYS    . valACYclovir (VALTREX) 500 MG tablet Take 1 tablet (500 mg total) by  mouth 2 (two) times daily. 10 tablet 2   No facility-administered medications prior to visit.    ROS Review of Systems  Constitutional: Negative.  Negative for chills, diaphoresis, fatigue and fever.  HENT: Negative.   Eyes: Negative for visual disturbance.  Respiratory: Negative for cough, chest tightness and shortness of breath.   Cardiovascular: Negative for chest pain, palpitations and leg swelling.  Gastrointestinal: Negative for abdominal pain, constipation, diarrhea, nausea and vomiting.  Genitourinary: Negative.  Negative for difficulty urinating, dysuria and hematuria.  Musculoskeletal: Positive for back pain. Negative for  arthralgias, myalgias and neck pain.  Skin: Negative.  Negative for color change and rash.  Neurological: Negative.  Negative for dizziness, weakness, light-headedness, numbness and headaches.  Hematological: Negative for adenopathy. Does not bruise/bleed easily.  Psychiatric/Behavioral: Negative.     Objective:  BP 132/84   Pulse 82   Temp 99 F (37.2 C) (Oral)   Resp 16   Ht 5' 2"  (1.575 m)   Wt 101 lb (45.8 kg)   SpO2 97%   BMI 18.47 kg/m   BP Readings from Last 3 Encounters:  02/17/20 132/84  01/19/20 122/88  12/17/19 130/78    Wt Readings from Last 3 Encounters:  02/17/20 101 lb (45.8 kg)  01/19/20 101 lb (45.8 kg)  12/17/19 101 lb (45.8 kg)    Physical Exam Vitals reviewed.  Constitutional:      Appearance: Normal appearance.  HENT:     Nose: Nose normal.     Mouth/Throat:     Mouth: Mucous membranes are moist.  Eyes:     General: No scleral icterus.    Conjunctiva/sclera: Conjunctivae normal.  Cardiovascular:     Rate and Rhythm: Normal rate and regular rhythm.     Heart sounds: No murmur heard.  No friction rub. No gallop.      Comments: EKG - NSR, 71 bpm Normal EKG Pulmonary:     Effort: Pulmonary effort is normal.     Breath sounds: No stridor. No wheezing, rhonchi or rales.  Abdominal:     General: Abdomen is flat. Bowel sounds are normal.     Palpations: There is no mass.     Tenderness: There is no abdominal tenderness. There is no guarding.  Musculoskeletal:     Cervical back: Normal and neck supple.     Thoracic back: No swelling, edema, deformity, spasms, tenderness or bony tenderness. Normal [name] of motion.     Lumbar back: Normal.     Right lower leg: No edema.     Left lower leg: No edema.  Skin:    General: Skin is warm and dry.     Findings: No rash.  Neurological:     General: No focal deficit present.     Mental Status: She is alert and oriented to person, place, and time. Mental status is at baseline.  Psychiatric:         Mood and Affect: Mood normal.        Behavior: Behavior normal.     Lab Results  Component Value Date   WBC 4.9 02/17/2020   HGB 13.3 02/17/2020   HCT 40.2 02/17/2020   PLT 201 02/17/2020   GLUCOSE 85 02/17/2020   CHOL 208 (H) 01/11/2020   TRIG 81 01/11/2020   HDL 75 01/11/2020   LDLCALC 115 (H) 01/11/2020   ALT 19 01/11/2020   AST 21 01/11/2020   NA 141 02/17/2020   K 4.5 02/17/2020   CL 105 02/17/2020  CREATININE 0.88 02/17/2020   BUN 19 02/17/2020   CO2 29 02/17/2020   TSH 2.14 01/11/2020    CT Head Wo Contrast  Result Date: 08/10/2019 CLINICAL DATA:  Complicated headache syndrome.  Neck pain. EXAM: CT HEAD WITHOUT CONTRAST CT CERVICAL SPINE WITHOUT CONTRAST TECHNIQUE: Multidetector CT imaging of the head and cervical spine was performed following the standard protocol without intravenous contrast. Multiplanar CT image reconstructions of the cervical spine were also generated. COMPARISON:  None. FINDINGS: CT HEAD FINDINGS Brain: No evidence of acute infarction, hemorrhage, hydrocephalus, extra-axial collection or mass lesion/mass effect. Vascular: Negative for hyperdense vessel Skull: Negative Sinuses/Orbits: Negative Other: None CT CERVICAL SPINE FINDINGS Alignment: 3 mm anterolisthesis C3-4. Mild anterolisthesis C2-3 and C7-T1 Skull base and vertebrae: Negative for fracture or mass. Soft tissues and spinal canal: Negative for mass or soft tissue swelling. Disc levels: Multilevel disc and facet degeneration in the cervical spine. Mild left foraminal narrowing C3-4 due to spurring. Marked left foraminal narrowing C4-5 due to prominent uncinate spurring and facet hypertrophy. Associated spinal stenosis. Moderate spinal stenosis and moderate left foraminal stenosis at C5-6. Mild foraminal narrowing bilaterally C6-7. Bilateral facet degeneration at C7-T1. Upper chest: Apical scarring bilaterally Other: None IMPRESSION: Negative CT head Cervical spondylosis with spinal and foraminal  stenosis as above. No acute cervical spine abnormality. Electronically Signed   By: Franchot Gallo M.D.   On: 08/10/2019 09:06   CT CERVICAL SPINE WO CONTRAST  Result Date: 08/10/2019 CLINICAL DATA:  Complicated headache syndrome.  Neck pain. EXAM: CT HEAD WITHOUT CONTRAST CT CERVICAL SPINE WITHOUT CONTRAST TECHNIQUE: Multidetector CT imaging of the head and cervical spine was performed following the standard protocol without intravenous contrast. Multiplanar CT image reconstructions of the cervical spine were also generated. COMPARISON:  None. FINDINGS: CT HEAD FINDINGS Brain: No evidence of acute infarction, hemorrhage, hydrocephalus, extra-axial collection or mass lesion/mass effect. Vascular: Negative for hyperdense vessel Skull: Negative Sinuses/Orbits: Negative Other: None CT CERVICAL SPINE FINDINGS Alignment: 3 mm anterolisthesis C3-4. Mild anterolisthesis C2-3 and C7-T1 Skull base and vertebrae: Negative for fracture or mass. Soft tissues and spinal canal: Negative for mass or soft tissue swelling. Disc levels: Multilevel disc and facet degeneration in the cervical spine. Mild left foraminal narrowing C3-4 due to spurring. Marked left foraminal narrowing C4-5 due to prominent uncinate spurring and facet hypertrophy. Associated spinal stenosis. Moderate spinal stenosis and moderate left foraminal stenosis at C5-6. Mild foraminal narrowing bilaterally C6-7. Bilateral facet degeneration at C7-T1. Upper chest: Apical scarring bilaterally Other: None IMPRESSION: Negative CT head Cervical spondylosis with spinal and foraminal stenosis as above. No acute cervical spine abnormality. Electronically Signed   By: Franchot Gallo M.D.   On: 08/10/2019 09:06   CLINICAL DATA:  Acute thoracic spine pain.  EXAM: THORACIC SPINE - 3 VIEWS  COMPARISON:  September 21, 2011.  FINDINGS: There is no evidence of thoracic spine fracture. Alignment is normal. No other significant bone abnormalities are  identified.  IMPRESSION: Negative.   Electronically Signed   By: Marijo Conception M.D.   On: 02/17/2020 08:56  Assessment & Plan:   [place] was seen today for back pain.  Diagnoses and all orders for this visit:  Thoracic spine pain- Based on her symptoms, exam, plain films, and labs I favor that this is musculoskeletal pain.  She will continue ibuprofen as needed. -     DG Thoracic Spine W/Swimmers; Future  Chest tightness- Her EKG is reassuring.  Chest x-ray is normal.  Troponin and D-dimer are normal.  I do not think she has any cardiopulmonary causes for chest pain. -     Troponin I (High Sensitivity); Future -     D-dimer, quantitative (not at [facility]); Future -     EKG 12-Lead -     D-dimer, quantitative (not at [facility]) -     Troponin I (High Sensitivity)  B12 deficiency -     Vitamin B12  Gastroesophageal reflux disease without esophagitis- I will see any evidence of complications related to this. -     BASIC METABOLIC PANEL WITH GFR  Leukopenia, unspecified type- Her white blood cell count is normal now. -     CBC with Differential/Platelet   I am having [name] maintain her Botulinum Toxin Type A (Cosm), tretinoin, fluocinonide cream, EpiPen 2-Pak, hyoscyamine, triamcinolone ointment, flecainide, urea, Cefaly Kit, Cholecalciferol (VITAMIN D3 PO), ALPRAZolam, UNABLE TO FIND, famotidine, SUMAtriptan, loratadine, Dexilant, clobetasol ointment, diltiazem, valACYclovir, Vitamin B-12, mometasone, and Premarin.  No orders of the defined types were placed in this encounter.  I spent 60 minutes in preparing to see the patient by review of recent labs, imaging and procedures, obtaining and reviewing separately obtained history, communicating with the patient and family or caregiver, ordering medications, tests or procedures, and documenting clinical information in the EHR including the differential Dx, treatment, and any further evaluation and other management of 1.  Thoracic spine pain 2. Chest tightness 3. B12 deficiency 4. Gastroesophageal reflux disease without esophagitis 5. Leukopenia, unspecified type     Follow-up: Return in about 3 weeks (around 03/09/2020).  Scarlette Calico, MD

## 2020-02-17 NOTE — Patient Instructions (Signed)
Acute Back Pain, Adult Acute back pain is sudden and usually short-lived. It is often caused by an injury to the muscles and tissues in the back. The injury may result from:  A muscle or ligament getting overstretched or torn (strained). Ligaments are tissues that connect bones to each other. Lifting something improperly can cause a back strain.  Wear and tear (degeneration) of the spinal disks. Spinal disks are circular tissue that provides cushioning between the bones of the spine (vertebrae).  Twisting motions, such as while playing sports or doing yard work.  A hit to the back.  Arthritis. You may have a physical exam, lab tests, and imaging tests to find the cause of your pain. Acute back pain usually goes away with rest and home care. Follow these instructions at home: Managing pain, stiffness, and swelling  Take over-the-counter and prescription medicines only as told by your health care provider.  Your health care provider may recommend applying ice during the first 24-48 hours after your pain starts. To do this: ? Put ice in a plastic bag. ? Place a towel between your skin and the bag. ? Leave the ice on for 20 minutes, 2-3 times a day.  If directed, apply heat to the affected area as often as told by your health care provider. Use the heat source that your health care provider recommends, such as a moist heat pack or a heating pad. ? Place a towel between your skin and the heat source. ? Leave the heat on for 20-30 minutes. ? Remove the heat if your skin turns bright red. This is especially important if you are unable to feel pain, heat, or cold. You have a greater risk of getting burned. Activity   Do not stay in bed. Staying in bed for more than 1-2 days can delay your recovery.  Sit up and stand up straight. Avoid leaning forward when you sit, or hunching over when you stand. ? If you work at a desk, sit close to it so you do not need to lean over. Keep your chin tucked  in. Keep your neck drawn back, and keep your elbows bent at a right angle. Your arms should look like the letter "L." ? Sit high and close to the steering wheel when you drive. Add lower back (lumbar) support to your car seat, if needed.  Take short walks on even surfaces as soon as you are able. Try to increase the length of time you walk each day.  Do not sit, drive, or stand in one place for more than 30 minutes at a time. Sitting or standing for long periods of time can put stress on your back.  Do not drive or use heavy machinery while taking prescription pain medicine.  Use proper lifting techniques. When you bend and lift, use positions that put less stress on your back: ? Bend your knees. ? Keep the load close to your body. ? Avoid twisting.  Exercise regularly as told by your health care provider. Exercising helps your back heal faster and helps prevent back injuries by keeping muscles strong and flexible.  Work with a physical therapist to make a safe exercise program, as recommended by your health care provider. Do any exercises as told by your physical therapist. Lifestyle  Maintain a healthy weight. Extra weight puts stress on your back and makes it difficult to have good posture.  Avoid activities or situations that make you feel anxious or stressed. Stress and anxiety increase muscle   tension and can make back pain worse. Learn ways to manage anxiety and stress, such as through exercise. General instructions  Sleep on a firm mattress in a comfortable position. Try lying on your side with your knees slightly bent. If you lie on your back, put a pillow under your knees.  Follow your treatment plan as told by your health care provider. This may include: ? Cognitive or behavioral therapy. ? Acupuncture or massage therapy. ? Meditation or yoga. Contact a health care provider if:  You have pain that is not relieved with rest or medicine.  You have increasing pain going down  into your legs or buttocks.  Your pain does not improve after 2 weeks.  You have pain at night.  You lose weight without trying.  You have a fever or chills. Get help right away if:  You develop new bowel or bladder control problems.  You have unusual weakness or numbness in your arms or legs.  You develop nausea or vomiting.  You develop abdominal pain.  You feel faint. Summary  Acute back pain is sudden and usually short-lived.  Use proper lifting techniques. When you bend and lift, use positions that put less stress on your back.  Take over-the-counter and prescription medicines and apply heat or ice as directed by your health care provider. This information is not intended to replace advice given to you by your health care provider. Make sure you discuss any questions you have with your health care provider. Document Revised: 09/23/2018 Document Reviewed: 01/16/2017 [employer] Patient Education  2020 [employer]  

## 2020-02-18 ENCOUNTER — Encounter: Payer: Self-pay | Admitting: Internal Medicine

## 2020-02-18 DIAGNOSIS — L309 Dermatitis, unspecified: Secondary | ICD-10-CM | POA: Diagnosis not present

## 2020-02-18 DIAGNOSIS — D229 Melanocytic nevi, unspecified: Secondary | ICD-10-CM | POA: Diagnosis not present

## 2020-02-18 LAB — CBC WITH DIFFERENTIAL/PLATELET
Absolute Monocytes: 358 cells/uL (ref 200–950)
Basophils Absolute: 49 cells/uL (ref 0–200)
Basophils Relative: 1 %
Eosinophils Absolute: 29 cells/uL (ref 15–500)
Eosinophils Relative: 0.6 %
HCT: 40.2 % (ref 35.0–45.0)
Hemoglobin: 13.3 g/dL (ref 11.7–15.5)
Lymphs Abs: 1117 cells/uL (ref 850–3900)
MCH: 31 pg (ref 27.0–33.0)
MCHC: 33.1 g/dL (ref 32.0–36.0)
MCV: 93.7 fL (ref 80.0–100.0)
MPV: 11.2 fL (ref 7.5–12.5)
Monocytes Relative: 7.3 %
Neutro Abs: 3347 cells/uL (ref 1500–7800)
Neutrophils Relative %: 68.3 %
Platelets: 201 10*3/uL (ref 140–400)
RBC: 4.29 10*6/uL (ref 3.80–5.10)
RDW: 11.8 % (ref 11.0–15.0)
Total Lymphocyte: 22.8 %
WBC: 4.9 10*3/uL (ref 3.8–10.8)

## 2020-02-18 LAB — BASIC METABOLIC PANEL WITH GFR
BUN: 19 mg/dL (ref 7–25)
CO2: 29 mmol/L (ref 20–32)
Calcium: 9.7 mg/dL (ref 8.6–10.4)
Chloride: 105 mmol/L (ref 98–110)
Creat: 0.88 mg/dL (ref 0.50–0.99)
GFR, Est African American: 79 mL/min/{1.73_m2} (ref >=60)
GFR, Est Non African American: 68 mL/min/{1.73_m2} (ref >=60)
Glucose, Bld: 85 mg/dL (ref 65–99)
Potassium: 4.5 mmol/L (ref 3.5–5.3)
Sodium: 141 mmol/L (ref 135–146)

## 2020-02-18 LAB — VITAMIN B12: Vitamin B-12: 764 pg/mL (ref 200–1100)

## 2020-02-18 LAB — D-DIMER, QUANTITATIVE: D-Dimer, Quant: 0.27 mcg/mL FEU (ref <0.50)

## 2020-02-23 DIAGNOSIS — J301 Allergic rhinitis due to pollen: Secondary | ICD-10-CM | POA: Diagnosis not present

## 2020-02-23 DIAGNOSIS — J3089 Other allergic rhinitis: Secondary | ICD-10-CM | POA: Diagnosis not present

## 2020-02-24 ENCOUNTER — Encounter: Payer: Self-pay | Admitting: Internal Medicine

## 2020-02-26 ENCOUNTER — Encounter: Payer: Self-pay | Admitting: Internal Medicine

## 2020-02-29 DIAGNOSIS — J3089 Other allergic rhinitis: Secondary | ICD-10-CM | POA: Diagnosis not present

## 2020-02-29 DIAGNOSIS — J301 Allergic rhinitis due to pollen: Secondary | ICD-10-CM | POA: Diagnosis not present

## 2020-03-04 ENCOUNTER — Other Ambulatory Visit: Payer: Self-pay

## 2020-03-04 ENCOUNTER — Ambulatory Visit (INDEPENDENT_AMBULATORY_CARE_PROVIDER_SITE_OTHER): Payer: Medicare Other | Admitting: Family

## 2020-03-04 VITALS — BP 110/60 | HR 77 | Temp 98.8°F | Ht 62.0 in | Wt 99.8 lb

## 2020-03-04 DIAGNOSIS — Z113 Encounter for screening for infections with a predominantly sexual mode of transmission: Secondary | ICD-10-CM | POA: Diagnosis not present

## 2020-03-04 DIAGNOSIS — Z20828 Contact with and (suspected) exposure to other viral communicable diseases: Secondary | ICD-10-CM | POA: Diagnosis not present

## 2020-03-04 DIAGNOSIS — Z114 Encounter for screening for human immunodeficiency virus [HIV]: Secondary | ICD-10-CM | POA: Diagnosis not present

## 2020-03-04 DIAGNOSIS — R399 Unspecified symptoms and signs involving the genitourinary system: Secondary | ICD-10-CM

## 2020-03-04 LAB — POCT URINALYSIS DIPSTICK
Bilirubin, UA: NEGATIVE
Blood, UA: 1
Glucose, UA: NEGATIVE
Ketones, UA: NEGATIVE
Leukocytes, UA: NEGATIVE
Nitrite, UA: NEGATIVE
Protein, UA: NEGATIVE
Spec Grav, UA: >=1.03 — AB (ref 1.010–1.025)
Urobilinogen, UA: 0.2 E.U./dL
pH, UA: 6 (ref 5.0–8.0)

## 2020-03-04 MED ORDER — CEFDINIR 300 MG PO CAPS: 300.0000 mg | ORAL_CAPSULE | Freq: Two times a day (BID) | ORAL | 0 refills | Status: DC

## 2020-03-04 NOTE — Progress Notes (Signed)
[name] is a 69 y.o. female with the following history as recorded in EpicCare:  Patient Active Problem List   Diagnosis Date Noted  . Thoracic spine pain 02/17/2020  . Allergic reaction to insect bite 10/21/2019  . Herpes zoster 09/17/2019  . Tachycardia 08/10/2019  . Vaccination complication 08/10/2019  . Vaccine reaction 07/20/2019  . Migraine without aura and without status migrainosus, not intractable 05/27/2019  . Migraine aura without headache 05/27/2019  . Myoneural disorder, unspecified (HCC) 04/09/2019  . Sore throat 06/13/2018  . Friction injury to skin 04/14/2018  . Occipital neuralgia 03/03/2018  . B12 deficiency 03/03/2018  . Ocular migraine 02/05/2018  . Leg wound, right 12/26/2017  . Bilateral impacted cerumen 10/21/2017  . LLQ abdominal pain 09/23/2017  . Elevated antinuclear antibody (ANA) level 07/26/2017  . Arthralgia 07/26/2017  . Degenerative disc disease, lumbar 07/04/2017  . Cough 06/06/2017  . Chest discomfort 03/21/2017  . Neck mass 01/17/2017  . Mass of neck 12/25/2016  . Snoring 12/18/2016  . Medial tibial stress syndrome, left, initial encounter 08/14/2016  . Patellofemoral syndrome of both knees 08/09/2016  . Drug allergy, antibiotic 05/31/2016  . Hyperkalemia 02/17/2016  . Vertical diplopia 02/15/2016  . Tension headache 12/28/2015  . BPPV (benign paroxysmal positional vertigo) 10/08/2015  . TMJ click 09/13/2015  . Cold sore 07/18/2015  . Osteopenia 07/18/2015  . Earache on left 07/18/2015  . Cervical disc disorder with radiculopathy of cervical region 06/17/2015  . Cotton wool spots 03/15/2015  . Left LBP 03/08/2015  . Food poisoning 03/08/2015  . Generalized anxiety disorder 03/08/2015  . Dysuria 02/23/2015  . Well adult exam 09/09/2014  . Internal hemorrhoids 09/07/2014  . Allergic urticaria 08/20/2014  . Acute sinusitis 08/18/2014  . Neck muscle spasm 07/30/2014  . Grief 06/17/2014  . Mild aortic insufficiency   .  Mild tricuspid regurgitation   . Vasovagal syncope   . Premature atrial contractions   . GERD (gastroesophageal reflux disease)   . PVC (premature ventricular contraction)   . Dense breast tissue 03/23/2014  . Microhematuria 03/22/2014  . Changing skin lesion 02/05/2014  . Rash and nonspecific skin eruption 12/22/2013  . Chronic meniscal tear of knee 12/21/2013  . Benign fasciculations 12/11/2013  . Strain of adductor magnus muscle of left lower extremity 05/21/2013  . Benign fasciculation-cramp syndrome 12/03/2012  . Vasovagal near syncope 11/02/2012  . Migraine with aura 11/02/2012  . Upper airway cough syndrome 08/16/2012  . Chest tightness 09/21/2011  . Bunion of great toe 09/11/2011  . Routine health maintenance 09/11/2011  . Mild mitral regurgitation by prior echocardiogram 05/15/2011  . Chronic neck pain 03/07/2011  . Skin lesion 03/07/2011  . DYSPLASTIC NEVUS, FACE 02/06/2010  . HAND PAIN, BILATERAL 08/25/2009  . Aortic valve disorder 08/02/2009  . Abnormal involuntary movements(781.0) 07/28/2009  . LUMBAR SPRAIN AND STRAIN 05/21/2009  . Palpitations 02/03/2009  . HEMORRHOIDS-INTERNAL 12/15/2008  . Constipation 12/15/2008  . Irritable bowel syndrome 12/15/2008  . RECTAL BLEEDING 12/15/2008  . RENAL CALCULUS, HX OF 11/29/2007  . Herpes simplex virus (HSV) infection 11/03/2007  . Vaginal atrophy 03/25/2007  . GLOBUS HYSTERICUS 01/01/2007  . RHINITIS, ALLERGIC NOS 01/01/2007    Current Outpatient Medications  Medication Sig Dispense Refill  . ALPRAZolam (XANAX) 0.25 MG tablet 0.5-1 po bid prn anxiety 30 tablet 1  . Botulinum Toxin Type A, Cosm, (BOTOX COSMETIC) 50 UNITS SOLR Inject 4-6 Units into the muscle. 3 month  injection    . Cholecalciferol (VITAMIN D3 PO) Take 50 mcg   by mouth daily.    . clobetasol ointment (TEMOVATE) 5.10 % Apply 1 application topically 2 (two) times daily. 15 g 0  . conjugated estrogens (PREMARIN) vaginal cream Use PV once every 2-3 days 90  g 2  . Cyanocobalamin (VITAMIN B-12) 1000 MCG SUBL PLACE 1 TABLET (1,000 MCG TOTAL) UNDER THE TONGUE DAILY. 100 tablet 3  . dexlansoprazole (DEXILANT) 60 MG capsule Take 1 capsule (60 mg total) by mouth daily. 90 capsule 3  . diltiazem (CARDIZEM) 30 MG tablet Take 1 tablet (30 mg total) by mouth daily as needed. 30 tablet 1  . EPIPEN 2-PAK 0.3 MG/0.3ML SOAJ injection See admin instructions. Reported on 10/08/2015  1  . famotidine (PEPCID) 40 MG tablet Take 1 tablet (40 mg total) by mouth daily. 90 tablet 3  . flecainide (TAMBOCOR) 50 MG tablet Take 50 mg by mouth as needed (take 1 tablet as needed for fast heart rate).    . fluocinonide cream (LIDEX) 2.58 % Apply 1 application topically 2 (two) times daily. 90 g 2  . hyoscyamine (LEVSIN SL) 0.125 MG SL tablet Place 1 tablet (0.125 mg total) under the tongue every 4 (four) hours as needed (for throat tightness). 30 tablet 3  . loratadine (CLARITIN) 10 MG tablet Take 1 tablet (10 mg total) by mouth daily. 90 tablet 3  . mometasone (NASONEX) 50 MCG/ACT nasal spray Place 2 sprays into the nose daily. 17 g 12  . Nerve Stimulator (CEFALY KIT) DEVI 1 Units by Does not apply route 2 (two) times daily. 1 application 1  . SUMAtriptan (IMITREX) 50 MG tablet Please specify directions, refills and quantity 9 tablet 0  . tretinoin (RETIN-A) 0.05 % cream Apply 1 application topically at bedtime.    . triamcinolone ointment (KENALOG) 0.5 % Apply 1 application topically 2 (two) times daily. 30 g 0  . UNABLE TO FIND once a week. Med Name: allergy shots    . urea (CARMOL) 40 % CREA 1 APPLICATION AS NEEDED ONCE A DAY EXTERNALLY 30 DAYS    . valACYclovir (VALTREX) 500 MG tablet Take 1 tablet (500 mg total) by mouth 2 (two) times daily. 10 tablet 2  . cefdinir (OMNICEF) 300 MG capsule Take 1 capsule (300 mg total) by mouth 2 (two) times daily. 10 capsule 0   No current facility-administered medications for this visit.    Allergies: Codeine, Doxycycline, Epinephrine,  Neomycin, Penicillins, Protonix [pantoprazole sodium], Sulfonamide derivatives, and Zithromax [azithromycin]  Past Medical History:  Diagnosis Date  . Allergic rhinitis, cause unspecified   . Backache, unspecified   . Benign fasciculation-cramp syndrome 12/03/2012    Worsening with  stress, fatigue .  [name] Calculus of kidney   . Conversion disorder   . Cotton wool spots   . Esophageal reflux   . Floater, vitreous, left 05/2016  . GERD (gastroesophageal reflux disease)   . Hemorrhage of rectum and anus   . Internal hemorrhoids without mention of complication   . Irritable bowel syndrome   . Microscopic hematuria   . Migraine headache with aura   . Mild aortic insufficiency   . Mild tricuspid regurgitation   . Osteopenia   . Other plastic surgery for unacceptable cosmetic appearance    Inj tx fllers/expander  . Personal history of urinary calculi   . Postmenopausal atrophic vaginitis   . Premature atrial contractions   . PVC (premature ventricular contraction)    a. Event monitor 2013.  [name] Unspecified constipation   . Vasovagal syncope  Past Surgical History:  Procedure Laterality Date  . BREAST BIOPSY Right   . Cosmetic Procedures w/Injection therapy    . SKIN CANCER EXCISION Left    thigh  . WISDOM TOOTH EXTRACTION      Family History  Problem Relation Age of Onset  . Hyperlipidemia Father   . COPD Father        Smoker, deceased 07-06-2014  . Hypertension Father   . Hyperlipidemia Mother   . Hypertension Mother   . Headache Sister   . Diabetes Neg Hx   . Colon cancer Neg Hx   . Breast cancer Neg Hx   . Coronary artery disease Neg Hx   . Esophageal cancer Neg Hx   . Stomach cancer Neg Hx   . Pancreatic cancer Neg Hx   . Liver disease Neg Hx     Social History   Tobacco Use  . Smoking status: Never Smoker  . Smokeless tobacco: Never Used  Substance Use Topics  . Alcohol use: No    Subjective:  Concerned for UTI; having some flank pain/ feels like she is  urinating more frequently; symptoms x 1 week; no blood in urine; no fever;   Also requesting HIV test today- no specific concerns but wants to get is updated;     Objective:  Vitals:   03/04/20 0921  BP: 110/60  Pulse: 77  Temp: 98.8 F (37.1 C)  TempSrc: Oral  SpO2: 99%  Weight: 99 lb 12.8 oz (45.3 kg)  Height: 5' 2" (1.575 m)    General: Well developed, well nourished, in no acute distress  Head: Normocephalic and atraumatic  Lungs: Respirations unlabored; clear to auscultation bilaterally without wheeze, rales, rhonchi  Abdomen: Soft; nontender; nondistended;  Neurologic: Alert and oriented; speech intact; face symmetrical; moves all extremities well; CNII-XII intact without focal deficit  Assessment:  1. Urinary tract infection symptoms   2. Encounter for screening for HIV   3. Viral disease exposure     Plan:  1. Check U/A and urine culture; Rx for Omnicef 300 mg bid x 5 days; follow up to be determined; 2. Check HIV per patient request;  This visit occurred during the SARS-CoV-2 public health emergency.  Safety protocols were in place, including screening questions prior to the visit, additional usage of staff PPE, and extensive cleaning of exam room while observing appropriate contact time as indicated for disinfecting solutions.     No follow-ups on file.  Orders Placed This Encounter  Procedures  . Urine Culture    Standing Status:   Future    Number of Occurrences:   1    Standing Expiration Date:   04/03/2020  . HIV Antibody (routine testing w rflx)    Standing Status:   Future    Number of Occurrences:   1    Standing Expiration Date:   03/04/2021  . POCT urinalysis dipstick    Requested Prescriptions   Signed Prescriptions Disp Refills  . cefdinir (OMNICEF) 300 MG capsule 10 capsule 0    Sig: Take 1 capsule (300 mg total) by mouth 2 (two) times daily.

## 2020-03-05 LAB — HIV ANTIBODY (ROUTINE TESTING W REFLEX): HIV 1&2 Ab, 4th Generation: NONREACTIVE

## 2020-03-05 LAB — URINE CULTURE: Result:: NO GROWTH

## 2020-03-07 ENCOUNTER — Telehealth: Payer: Self-pay

## 2020-03-07 ENCOUNTER — Ambulatory Visit: Payer: Medicare Other | Admitting: Family Medicine

## 2020-03-07 DIAGNOSIS — J3089 Other allergic rhinitis: Secondary | ICD-10-CM | POA: Diagnosis not present

## 2020-03-07 DIAGNOSIS — J301 Allergic rhinitis due to pollen: Secondary | ICD-10-CM | POA: Diagnosis not present

## 2020-03-07 NOTE — Telephone Encounter (Signed)
Spoke with patient and info given 

## 2020-03-07 NOTE — Telephone Encounter (Signed)
Her labs are normal; not a UTI- stop the antibiotics; suspect it's muscular- keep planned follow-up with Dr. [name] for Wednesday.

## 2020-03-09 ENCOUNTER — Ambulatory Visit (INDEPENDENT_AMBULATORY_CARE_PROVIDER_SITE_OTHER): Payer: Medicare Other | Admitting: Internal Medicine

## 2020-03-09 ENCOUNTER — Other Ambulatory Visit: Payer: Self-pay

## 2020-03-09 ENCOUNTER — Encounter: Payer: Self-pay | Admitting: Internal Medicine

## 2020-03-09 DIAGNOSIS — M545 Low back pain, unspecified: Secondary | ICD-10-CM

## 2020-03-09 DIAGNOSIS — K582 Mixed irritable bowel syndrome: Secondary | ICD-10-CM | POA: Diagnosis not present

## 2020-03-09 DIAGNOSIS — E538 Deficiency of other specified B group vitamins: Secondary | ICD-10-CM

## 2020-03-09 IMAGING — CT CT HEAD W/O CM
3 of 4 series · 15 of 47 positions shown, 18 images · non-contrast
Comparison: None.

CLINICAL DATA: Complicated headache syndrome.  Neck pain.

EXAM:
CT HEAD WITHOUT CONTRAST
CT CERVICAL SPINE WITHOUT CONTRAST
TECHNIQUE: Multidetector CT imaging of the head and cervical spine was
performed following the standard protocol without intravenous
contrast. Multiplanar CT image reconstructions of the cervical spine
were also generated.

[Series 3: head 5.00 hr40 s3 axial ibhc · axial · 0.43mm/px · z∈[-566,-446]mm · 9 of 30 slices shown, 12 images]
[im 3/30  brain]
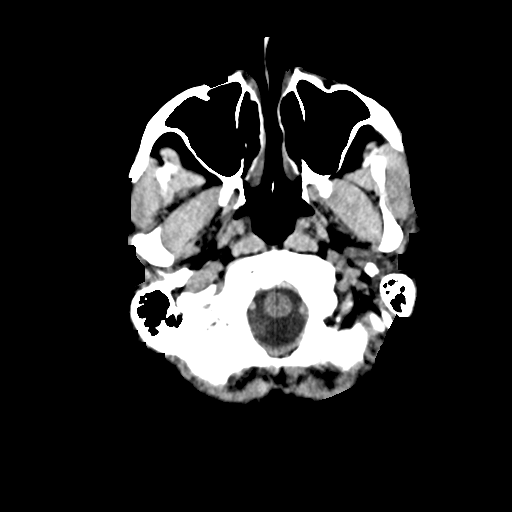
[im 3/30  bone]
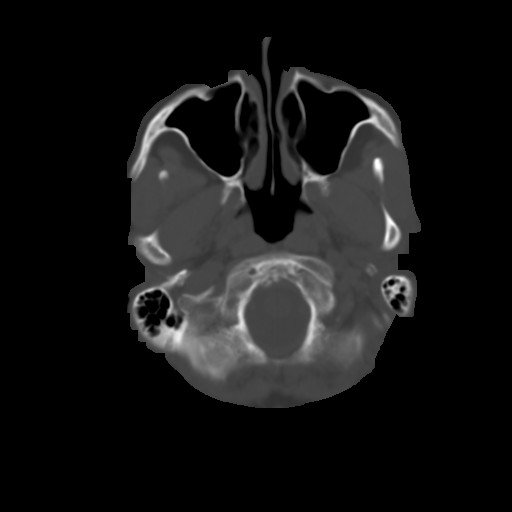
[im 7/30  brain]
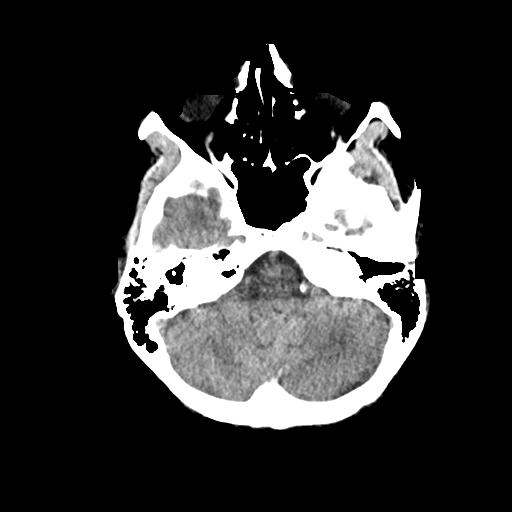
[im 9/30  brain]
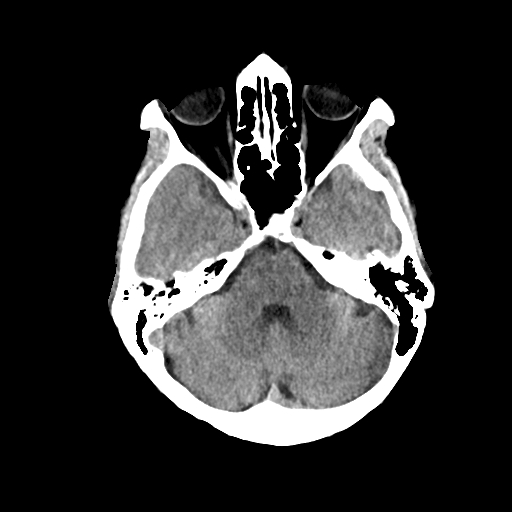
[im 13/30  brain]
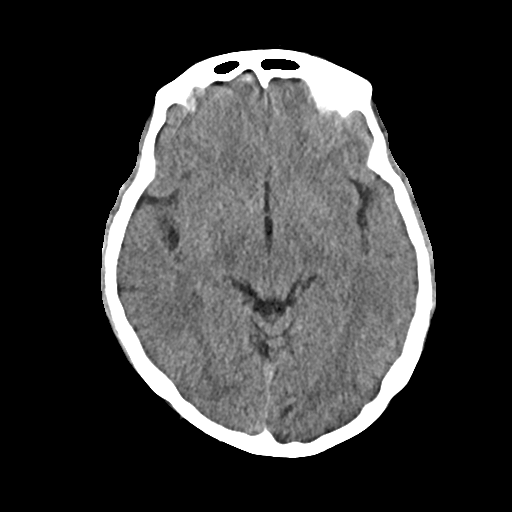
[im 15/30  brain]
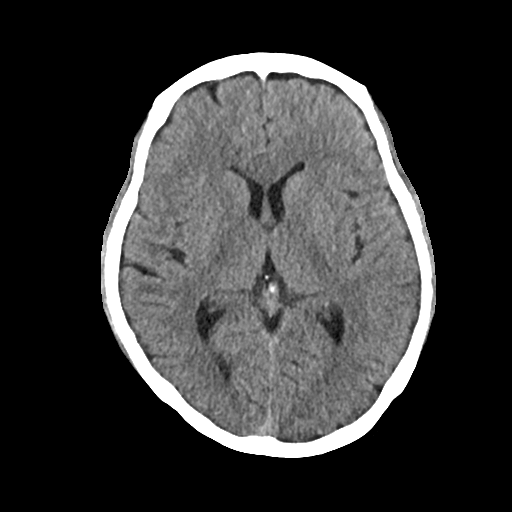
[im 15/30  bone]
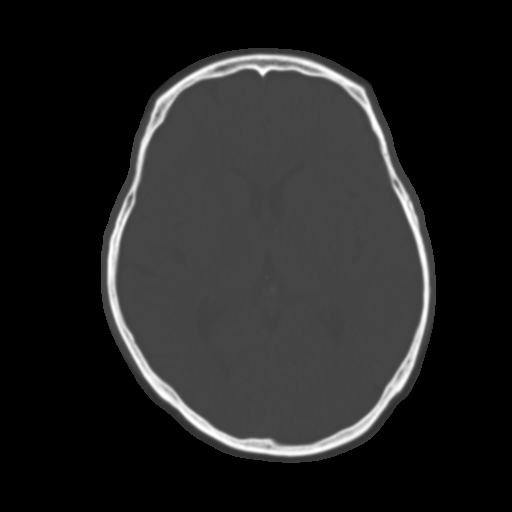
[im 17/30  brain]
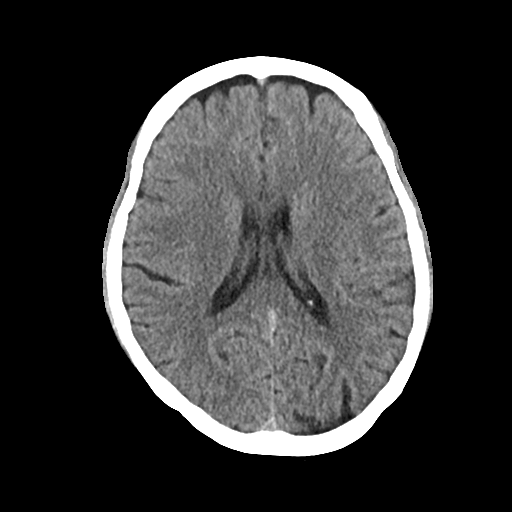
[im 21/30  brain]
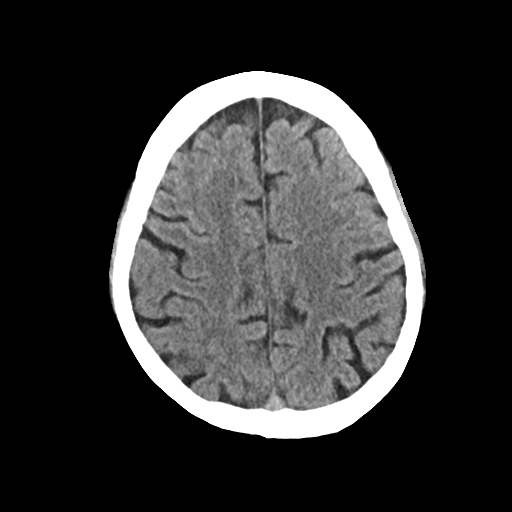
[im 23/30  brain]
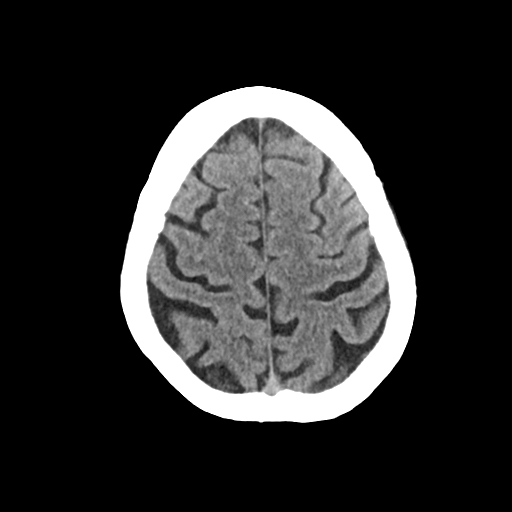
[im 27/30  brain]
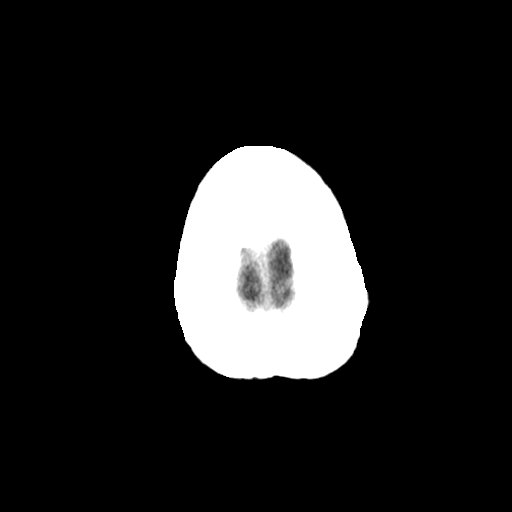
[im 27/30  bone]
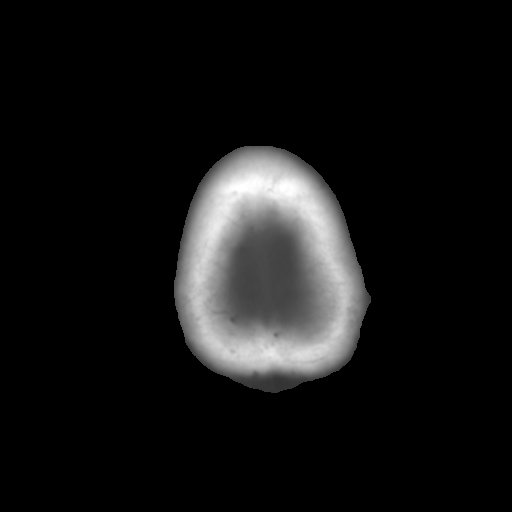

[Series 5: head 3.00 hr40 s3 sag · sagittal · 0.30mm/px · 3 of 56 slices shown]
[im 19/56  brain]
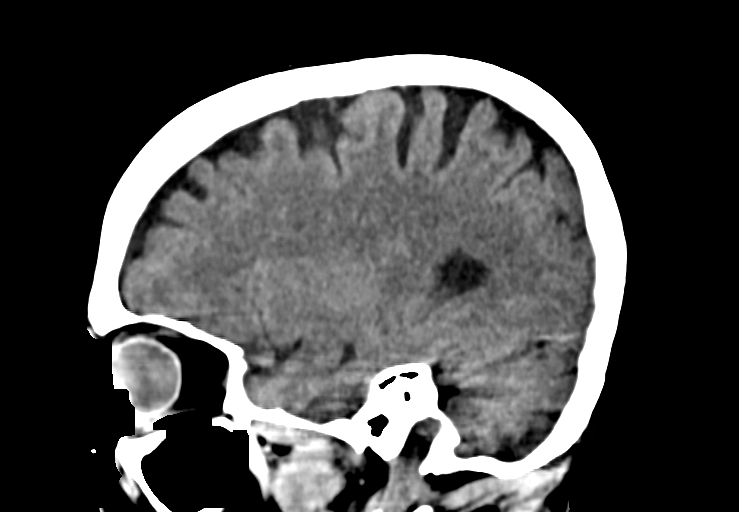
[im 28/56  brain]
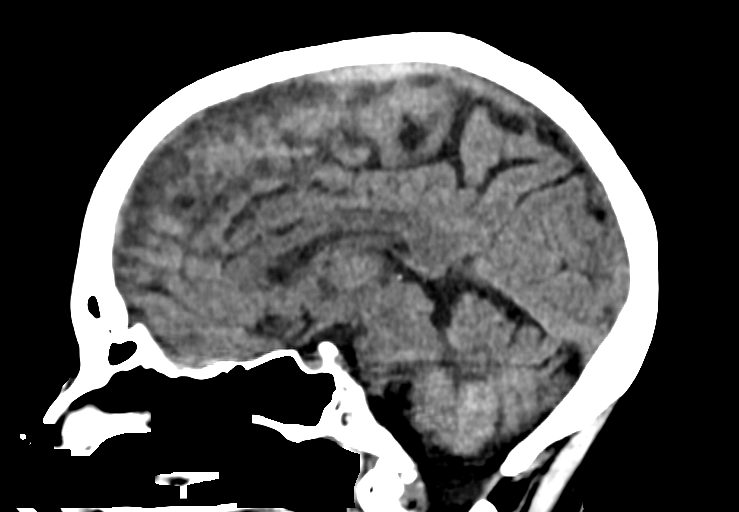
[im 37/56  brain]
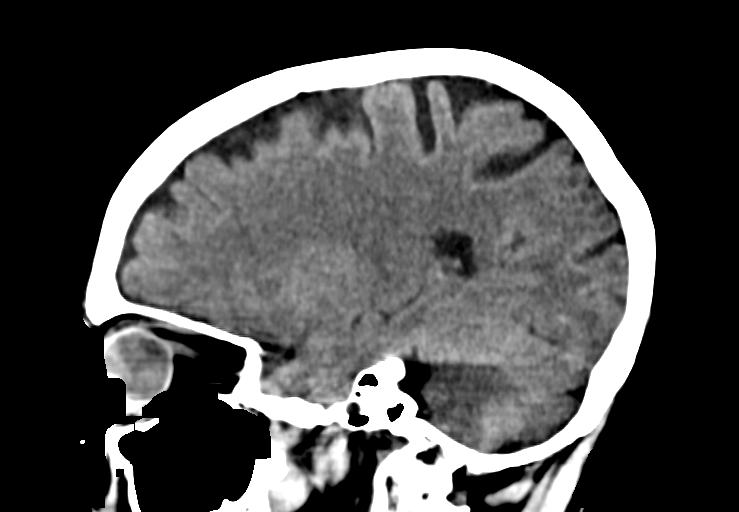

[Series 7: head 3.00 hr40 s3 cor · coronal · 0.30mm/px · 3 of 73 slices shown]
[im 25/73  brain]
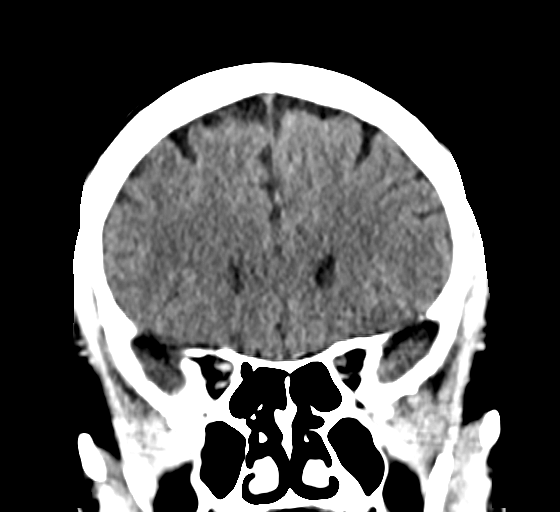
[im 33/73  brain]
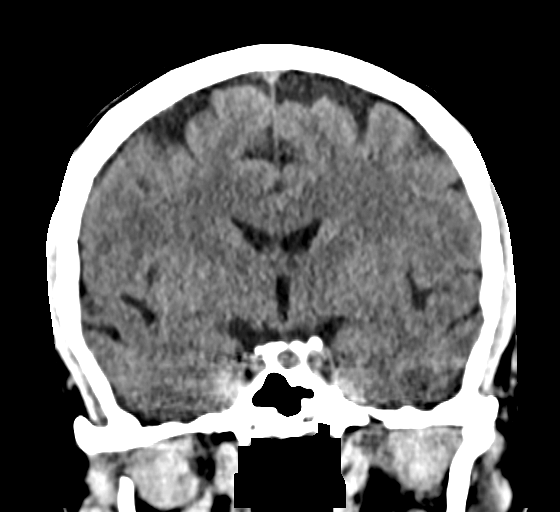
[im 41/73  brain]
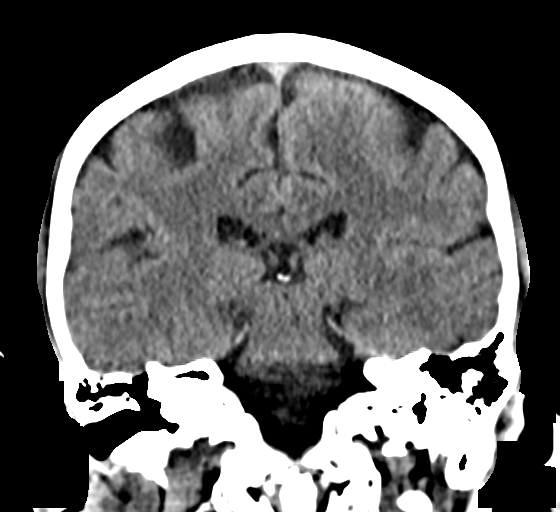

[15 of 47 positions shown; findings below may reference images not displayed]

FINDINGS: CT HEAD FINDINGS

Brain: No evidence of acute infarction, hemorrhage, hydrocephalus,
extra-axial collection or mass lesion/mass effect.

Vascular: Negative for hyperdense vessel

Skull: Negative

Sinuses/Orbits: Negative

Other: None

CT CERVICAL SPINE FINDINGS

Alignment: 3 mm anterolisthesis C3-4. Mild anterolisthesis C2-3 and
C7-T1

Skull base and vertebrae: Negative for fracture or mass.

Soft tissues and spinal canal: Negative for mass or soft tissue
swelling.

Disc levels: Multilevel disc and facet degeneration in the cervical
spine. Mild left foraminal narrowing C3-4 due to spurring. Marked
left foraminal narrowing C4-5 due to prominent uncinate spurring and
facet hypertrophy. Associated spinal stenosis. Moderate spinal
stenosis and moderate left foraminal stenosis at C5-6. Mild
foraminal narrowing bilaterally C6-7. Bilateral facet degeneration
at C7-T1.

Upper chest: Apical scarring bilaterally

Other: None
IMPRESSION: Negative CT head

Cervical spondylosis with spinal and foraminal stenosis as above. No
acute cervical spine abnormality.

## 2020-03-09 MED ORDER — LINACLOTIDE 72 MCG PO CAPS: 72.0000 ug | ORAL_CAPSULE | Freq: Every day | ORAL | 3 refills | Status: DC

## 2020-03-09 NOTE — Assessment & Plan Note (Signed)
Worse Try linzess

## 2020-03-09 NOTE — Progress Notes (Signed)
Subjective:  Patient ID: [name], female    DOB: 1950/07/11  Age: 69 y.o. MRN: [id number]  CC: No chief complaint on file.   HPI Carita Sollars presents for L LBP x 2 wks - treated for a UTI on 9/17, then stopped abx due to (-) cx.Pain was 9/10. She was treated for MSK LBP then. Pain is better today 5/10.  Outpatient Medications Prior to Visit  Medication Sig Dispense Refill  . ALPRAZolam (XANAX) 0.25 MG tablet 0.5-1 po bid prn anxiety 30 tablet 1  . Botulinum Toxin Type A, Cosm, (BOTOX COSMETIC) 50 UNITS SOLR Inject 4-6 Units into the muscle. 3 month  injection    . cefdinir (OMNICEF) 300 MG capsule Take 1 capsule (300 mg total) by mouth 2 (two) times daily. 10 capsule 0  . Cholecalciferol (VITAMIN D3 PO) Take 50 mcg by mouth daily.    . clobetasol ointment (TEMOVATE) 5.91 % Apply 1 application topically 2 (two) times daily. 15 g 0  . conjugated estrogens (PREMARIN) vaginal cream Use PV once every 2-3 days 90 g 2  . Cyanocobalamin (VITAMIN B-12) 1000 MCG SUBL PLACE 1 TABLET (1,000 MCG TOTAL) UNDER THE TONGUE DAILY. 100 tablet 3  . dexlansoprazole (DEXILANT) 60 MG capsule Take 1 capsule (60 mg total) by mouth daily. 90 capsule 3  . diltiazem (CARDIZEM) 30 MG tablet Take 1 tablet (30 mg total) by mouth daily as needed. 30 tablet 1  . EPIPEN 2-PAK 0.3 MG/0.3ML SOAJ injection See admin instructions. Reported on 10/08/2015  1  . famotidine (PEPCID) 40 MG tablet Take 1 tablet (40 mg total) by mouth daily. 90 tablet 3  . flecainide (TAMBOCOR) 50 MG tablet Take 50 mg by mouth as needed (take 1 tablet as needed for fast heart rate).    . fluocinonide cream (LIDEX) 6.38 % Apply 1 application topically 2 (two) times daily. 90 g 2  . hyoscyamine (LEVSIN SL) 0.125 MG SL tablet Place 1 tablet (0.125 mg total) under the tongue every 4 (four) hours as needed (for throat tightness). 30 tablet 3  . loratadine (CLARITIN) 10 MG tablet Take 1 tablet (10 mg total) by mouth daily. 90 tablet 3  .  mometasone (NASONEX) 50 MCG/ACT nasal spray Place 2 sprays into the nose daily. 17 g 12  . Nerve Stimulator (CEFALY KIT) DEVI 1 Units by Does not apply route 2 (two) times daily. 1 application 1  . SUMAtriptan (IMITREX) 50 MG tablet Please specify directions, refills and quantity 9 tablet 0  . tretinoin (RETIN-A) 0.05 % cream Apply 1 application topically at bedtime.    . triamcinolone ointment (KENALOG) 0.5 % Apply 1 application topically 2 (two) times daily. 30 g 0  . UNABLE TO FIND once a week. Med Name: allergy shots    . urea (CARMOL) 40 % CREA 1 APPLICATION AS NEEDED ONCE A DAY EXTERNALLY 30 DAYS    . valACYclovir (VALTREX) 500 MG tablet Take 1 tablet (500 mg total) by mouth 2 (two) times daily. 10 tablet 2   No facility-administered medications prior to visit.    ROS: Review of Systems  Constitutional: Negative for activity change, appetite change, chills, fatigue and unexpected weight change.  HENT: Negative for congestion, mouth sores and sinus pressure.   Eyes: Negative for visual disturbance.  Respiratory: Negative for cough and chest tightness.   Gastrointestinal: Negative for abdominal pain and nausea.  Genitourinary: Negative for difficulty urinating, frequency and vaginal pain.  Musculoskeletal: Positive for back pain. Negative for gait  problem.  Skin: Negative for pallor and rash.  Neurological: Negative for dizziness, tremors, weakness, numbness and headaches.  Psychiatric/Behavioral: Negative for confusion and sleep disturbance.    Objective:  BP 114/78   Pulse 79   Temp 98.5 F (36.9 C) (Oral)   Ht 5' 2"  (1.575 m)   Wt 99 lb (44.9 kg)   SpO2 96%   BMI 18.11 kg/m   BP Readings from Last 3 Encounters:  03/09/20 114/78  03/04/20 110/60  02/17/20 132/84    Wt Readings from Last 3 Encounters:  03/09/20 99 lb (44.9 kg)  03/04/20 99 lb 12.8 oz (45.3 kg)  02/17/20 101 lb (45.8 kg)    Physical Exam Constitutional:      General: She is not in acute  distress.    Appearance: She is well-developed.  HENT:     Head: Normocephalic.     Right Ear: External ear normal.     Left Ear: External ear normal.     Nose: Nose normal.  Eyes:     General:        Right eye: No discharge.        Left eye: No discharge.     Conjunctiva/sclera: Conjunctivae normal.     Pupils: Pupils are equal, round, and reactive to light.  Neck:     Thyroid: No thyromegaly.     Vascular: No JVD.     Trachea: No tracheal deviation.  Cardiovascular:     Rate and Rhythm: Normal rate and regular rhythm.     Heart sounds: Normal heart sounds.  Pulmonary:     Effort: No respiratory distress.     Breath sounds: No stridor. No wheezing.  Abdominal:     General: Bowel sounds are normal. There is no distension.     Palpations: Abdomen is soft. There is no mass.     Tenderness: There is no abdominal tenderness. There is no guarding or rebound.  Musculoskeletal:        General: Tenderness present.     Cervical back: Normal [name] of motion and neck supple.  Lymphadenopathy:     Cervical: No cervical adenopathy.  Skin:    Findings: No erythema or rash.  Neurological:     Cranial Nerves: No cranial nerve deficit.     Motor: No abnormal muscle tone.     Coordination: Coordination normal.     Deep Tendon Reflexes: Reflexes normal.  Psychiatric:        Behavior: Behavior normal.        Thought Content: Thought content normal.        Judgment: Judgment normal.     Lab Results  Component Value Date   WBC 4.9 02/17/2020   HGB 13.3 02/17/2020   HCT 40.2 02/17/2020   PLT 201 02/17/2020   GLUCOSE 85 02/17/2020   CHOL 208 (H) 01/11/2020   TRIG 81 01/11/2020   HDL 75 01/11/2020   LDLCALC 115 (H) 01/11/2020   ALT 19 01/11/2020   AST 21 01/11/2020   NA 141 02/17/2020   K 4.5 02/17/2020   CL 105 02/17/2020   CREATININE 0.88 02/17/2020   BUN 19 02/17/2020   CO2 29 02/17/2020   TSH 2.14 01/11/2020    CT Head Wo Contrast  Result Date: 08/10/2019 CLINICAL  DATA:  Complicated headache syndrome.  Neck pain. EXAM: CT HEAD WITHOUT CONTRAST CT CERVICAL SPINE WITHOUT CONTRAST TECHNIQUE: Multidetector CT imaging of the head and cervical spine was performed following the standard protocol without intravenous contrast.  Multiplanar CT image reconstructions of the cervical spine were also generated. COMPARISON:  None. FINDINGS: CT HEAD FINDINGS Brain: No evidence of acute infarction, hemorrhage, hydrocephalus, extra-axial collection or mass lesion/mass effect. Vascular: Negative for hyperdense vessel Skull: Negative Sinuses/Orbits: Negative Other: None CT CERVICAL SPINE FINDINGS Alignment: 3 mm anterolisthesis C3-4. Mild anterolisthesis C2-3 and C7-T1 Skull base and vertebrae: Negative for fracture or mass. Soft tissues and spinal canal: Negative for mass or soft tissue swelling. Disc levels: Multilevel disc and facet degeneration in the cervical spine. Mild left foraminal narrowing C3-4 due to spurring. Marked left foraminal narrowing C4-5 due to prominent uncinate spurring and facet hypertrophy. Associated spinal stenosis. Moderate spinal stenosis and moderate left foraminal stenosis at C5-6. Mild foraminal narrowing bilaterally C6-7. Bilateral facet degeneration at C7-T1. Upper chest: Apical scarring bilaterally Other: None IMPRESSION: Negative CT head Cervical spondylosis with spinal and foraminal stenosis as above. No acute cervical spine abnormality. Electronically Signed   By: Franchot Gallo M.D.   On: 08/10/2019 09:06   CT CERVICAL SPINE WO CONTRAST  Result Date: 08/10/2019 CLINICAL DATA:  Complicated headache syndrome.  Neck pain. EXAM: CT HEAD WITHOUT CONTRAST CT CERVICAL SPINE WITHOUT CONTRAST TECHNIQUE: Multidetector CT imaging of the head and cervical spine was performed following the standard protocol without intravenous contrast. Multiplanar CT image reconstructions of the cervical spine were also generated. COMPARISON:  None. FINDINGS: CT HEAD FINDINGS Brain:  No evidence of acute infarction, hemorrhage, hydrocephalus, extra-axial collection or mass lesion/mass effect. Vascular: Negative for hyperdense vessel Skull: Negative Sinuses/Orbits: Negative Other: None CT CERVICAL SPINE FINDINGS Alignment: 3 mm anterolisthesis C3-4. Mild anterolisthesis C2-3 and C7-T1 Skull base and vertebrae: Negative for fracture or mass. Soft tissues and spinal canal: Negative for mass or soft tissue swelling. Disc levels: Multilevel disc and facet degeneration in the cervical spine. Mild left foraminal narrowing C3-4 due to spurring. Marked left foraminal narrowing C4-5 due to prominent uncinate spurring and facet hypertrophy. Associated spinal stenosis. Moderate spinal stenosis and moderate left foraminal stenosis at C5-6. Mild foraminal narrowing bilaterally C6-7. Bilateral facet degeneration at C7-T1. Upper chest: Apical scarring bilaterally Other: None IMPRESSION: Negative CT head Cervical spondylosis with spinal and foraminal stenosis as above. No acute cervical spine abnormality. Electronically Signed   By: Franchot Gallo M.D.   On: 08/10/2019 09:06    Assessment & Plan:    [name], MD

## 2020-03-09 NOTE — Assessment & Plan Note (Signed)
On B12 

## 2020-03-09 NOTE — Assessment & Plan Note (Signed)
Try yoga

## 2020-03-14 DIAGNOSIS — J3089 Other allergic rhinitis: Secondary | ICD-10-CM | POA: Diagnosis not present

## 2020-03-14 DIAGNOSIS — J301 Allergic rhinitis due to pollen: Secondary | ICD-10-CM | POA: Diagnosis not present

## 2020-03-21 ENCOUNTER — Telehealth: Payer: Self-pay | Admitting: Emergency Medicine

## 2020-03-21 DIAGNOSIS — N6009 Solitary cyst of unspecified breast: Secondary | ICD-10-CM

## 2020-03-21 DIAGNOSIS — J301 Allergic rhinitis due to pollen: Secondary | ICD-10-CM | POA: Diagnosis not present

## 2020-03-21 DIAGNOSIS — J3089 Other allergic rhinitis: Secondary | ICD-10-CM | POA: Diagnosis not present

## 2020-03-21 NOTE — Telephone Encounter (Signed)
Pt called and needs an order faxed over to [facility] for her ultrasound. The fax number is [phone]. Thanks.

## 2020-03-22 ENCOUNTER — Ambulatory Visit: Payer: Medicare Other | Admitting: Family Medicine

## 2020-03-22 DIAGNOSIS — H9202 Otalgia, left ear: Secondary | ICD-10-CM | POA: Diagnosis not present

## 2020-03-22 NOTE — Telephone Encounter (Signed)
    Patient calling to request order for breast ultrasound be faxed to [facility] at [phone] attn:Jessica Her appointment is 10/6

## 2020-03-22 NOTE — Telephone Encounter (Signed)
OK. Thx

## 2020-03-22 NOTE — Telephone Encounter (Signed)
faxed to [facility] fax # [phone]..03/22/20 mp

## 2020-03-24 ENCOUNTER — Ambulatory Visit: Payer: Medicare Other | Admitting: Internal Medicine

## 2020-03-25 ENCOUNTER — Ambulatory Visit: Payer: Medicare Other | Admitting: Family

## 2020-03-28 ENCOUNTER — Ambulatory Visit: Payer: Medicare Other

## 2020-03-28 DIAGNOSIS — J3089 Other allergic rhinitis: Secondary | ICD-10-CM | POA: Diagnosis not present

## 2020-03-28 DIAGNOSIS — J301 Allergic rhinitis due to pollen: Secondary | ICD-10-CM | POA: Diagnosis not present

## 2020-04-06 ENCOUNTER — Ambulatory Visit (INDEPENDENT_AMBULATORY_CARE_PROVIDER_SITE_OTHER): Payer: Medicare Other

## 2020-04-06 ENCOUNTER — Other Ambulatory Visit: Payer: Self-pay

## 2020-04-06 ENCOUNTER — Encounter: Payer: Self-pay | Admitting: Internal Medicine

## 2020-04-06 DIAGNOSIS — Z23 Encounter for immunization: Secondary | ICD-10-CM

## 2020-04-11 ENCOUNTER — Ambulatory Visit (INDEPENDENT_AMBULATORY_CARE_PROVIDER_SITE_OTHER): Payer: Medicare Other | Admitting: Neurology

## 2020-04-11 ENCOUNTER — Encounter: Payer: Self-pay | Admitting: Neurology

## 2020-04-11 VITALS — BP 119/71 | HR 80 | Ht 62.0 in | Wt 104.0 lb

## 2020-04-11 DIAGNOSIS — R253 Fasciculation: Secondary | ICD-10-CM | POA: Diagnosis not present

## 2020-04-11 NOTE — Progress Notes (Signed)
[facility]  Provider:  Dr Brett Fairy Referring Provider: Plotnikov, [name], MD Primary Care Physician:  Plotnikov, [name], MD    HPI:  [name] is a 69 y.o. female here as a patient of  Dr. [name] for follow up on her headaches. RV 04-11-2020: CT head in February 2021 was negative. CT cervical spine showed C4-5 foraminal narrowing, bone spurs, and DDD.  Her headaches have done better. She presented me a video of her [name] facilitating - benign, no ALS, as we have many times discussed before. She has noted a correlation between caffeine and fasciculation. She is active at [facility]- and she is fully vaccinated.      RV  04-09-2019, retired from apartment managing, husband retired Emergency planning/management officer.  She still reports auras but no headaches after these. Rare left eye / temple headache 2 times a mont and these responds to Motrin within hours.  She has benign fasciculations. Takes cosmetic Botox and feels it helps with headches. I will arrange for Cefaly device and perhaps TMS procedure, depending on costs.   I see this patient today , 04-07-2018, in a yearly follow up-  Her husband just retired this Summer, is home.  She had a visual field restriction on one day,  Occurrence on August 7th 2019 - she had seen ophthalmology on call ,followed by her regular eye specialist , [name]. Than was send for carotid doppler , which revealed wide open arteries ( Dr. Angelena Form). On 02-28-2018 she suddenly felt a severe [name] pain. She had reached for something on the floor of her car- The pain went away, but the tenderness remained over the right occipital area. She wonders about occipital neuralgia. She still has benign fasciculations. She still takes omeprazole.   I had the pleasure of seeing [name] today on 04/01/2017, she has noticed that she has more headaches since taking pantoprazole. She started an every other day regimen and noted that headaches aren't changing  according to the day of intake. She still struggles with reflux however and is awaiting appointments with Dr. Dorann Lodge often Dr. Henrene Pastor to address his underlying condition. Reflux causes her to cough at night and in daytime. She has also noted some heart palpitations, transiently reported substernal chest wall fasciculations that seemed to have resolved as spontaneously as they came.  I have the pleasure of seeing [name] today on 12/18/2016. She reports that she has still freedom from headaches, her fasciculations have much resolved, the lifestyle changes she told me about in 2017 have really helped her. She does have some [name] arthritis and she has noticed that sometimes pain seems to radiate from the [name] towards the head. Headaches may be announced by an AURA, nausea, resolve with motrin, can be associated with phonophobia.  We wondered if we should obtain an MRI brain and cervical spine at the same time, I would be very much in favor of this, and [name] will still think about. In addition she reports that her husband plans to retire next year. She has noted that she sleeps best when alone. Her husband is a Insurance underwriter and is often not at home, she is worried that when they finally lift and sleep together in the same room that would be more interruption of her sleep but also her snoring may interrupt his sleep. For this reason we will undergo a home sleep test to see if she has apnea or is truly just snoring. Again, most of the night she will spend  alone and there is no witness to her asleep. She continues to be physically active, with a low body mass index and no medical conditions of concern at this time.   HPI- beginning 2012 :  The patient reports that she had 2 spells of near-syncope one in May 2014 and one in June of 2014 . She also has a history of migraines with aura, benign fasciculations which have affected also facial muscles, at times presenting like a tic. The benign fasciculations have  been present for over 2 decades and are not related to a minor atrophic lateral sclerosis. Pulse of the near-syncope spells but the patient reported had a diplopia component and appeared to have been experienced as a vertical. The patient reports that the first spell was likely related to dehydration but is not quite sure about the second. The spells don't last long and if she takes fluid and fluid they quickly resolve and do not return. She was sent by her primary care physician for an MRI of the brain which returned normal. She has been seen by [name]  for the brief dizziness, loss of vision , diplopia. She is diagnosed of with macular degeneration early stage , myopia, and ocular migraines.  Vision was 20/25 in both eyes, ocular  pressure was 18 in the right, and 19 mm in the left eye and [name]  reported that the patient is early cataract formation but she can see well.The optic nerves are healthy , but there is a macular drusen in the right eye he recommended ocular vitamins and to continue contact lens use the patient was otherwise found to be in good health ,and he did not impose any restrictions on driving activities of daily living etc. The detailed report is reviewed here today as is her MRI report which was entirely normal. She has meanwhile reduced or eliminated her caffeine intake and noticed a reduction in twitches. Her results of the EMG and NCV study have helped her to relax and fee safe with her condition. She is snoring now, which doesn't bother her but her spouse.  She has noted a feeling of thumping at the right ear and her ENT suggested that this may be a manifestation of her fasciculations. Her parents lives in [place], [place]. Close to her sister, her father is in a rehab facility. Her husband is an Emergency planning/management officer, now with [employer].   09-13-2015 Benign fasciculations resolved !  Patient presents now with an ear pain, left side only. Nothing visible on the left ear.  Her  orthodontist noted some TMJ  click.  At the same time she had half sided headaches. These were not neuralgic and have resolved.  The patient does not recall having any zoster outbreaks, she did have some aphtose stomatitis non-herpetic. Dentist found no herpetic lesions and Crista Luria tested for zoster- negative. Since she does have a slight click at the left temporomandibular joint I recommend that she will purchase a baby toothbrush, one of those that are worn on top of the index finger like a thimble, and massages from the inside the area of the TMJ joint, she can use it on the outside as well.  This helps TMJ pain and click to disappear quicker. At this time I'm just happy that her symptoms have resolved and I'm happy to see her for any recurrent issues or new problems in the future.  02-15-2016, [name] has experienced an isolated event of diplopia that she related to the  left eye. She also has a recurrence of migrainous headaches but they're all a has changed. She used to experience fortification lines which is  now associated with a very bright light perception. The visual aura the last 20-30 minutes. A lot of times there is no headache to follow. She experienced the diplopia before she saw her ophthalmologist [name], who then ordered a carotid Doppler study which returned with clinically insignificant results. Today, her headache is more like a pressure. She continued allergy shots with [name]. A few years ago she has experienced vertical diplopia, and MRI in 2014  was negative- ordered by Dr [name] at the time  . She may take 3 Motrin in a week. I explained that migraines can change to this state with reaching the 5th decade of life. Many women that experienced severe nausea and headaches associated with menstrual cycles, has later in life the aura  without headaches to follow . Is benign fasciculations there is truly not changed there is no treatment available nor is it necessary. Is  important for the patient to stay hydrated and could not get hypoglycemic as these will lead to presyncopal spells as well as an aunt who took and frequency rise in fasciculations, an  asked her to restrict caffeine intake.  03-27-2016,  patient reports resolution of headaches after quitting chocolate covered almonds ! No more diplopia , rare fasciculation. All tests were normal.     Review of Systems: Out of a complete 14 system review, the patient complains of only the following symptoms, and all other reviewed systems are negative. Less twitching . Snoring reported again, she is complaining of nasal drip , eye itching . HST negative .She is treated by Warren Lacy, MD at [facility] allergy. She inquired about a sleep evaluation. I would like for her to use her nasal spray at night to see if she would stop mouth breathing which is the main reason for snoring. She reports diplopia, and related this to Dr [name] who in return ordered to evaluate her carotid artery- again negative for significant stenosis - 01-2018.    She reports no changes in handwriting,  Voice , or  swallowing.   Social History   Socioeconomic History   Marital status: Married    Spouse name: [name]   Number of children: 0   Years of education: 14   Highest education level: Not on file  Occupational History   Occupation: OFFICE liasion    Employer: [employer]   Occupation: Real Data processing manager   Occupation: OFFICE LEAZON    Employer: [employer]  Tobacco Use   Smoking status: Never Smoker   Smokeless tobacco: Never Used  Scientific laboratory technician Use: Never used  Substance and Sexual Activity   Alcohol use: No   Drug use: No   Sexual activity: Not on file  Other Topics Concern   Not on file  Social History Narrative   Vocational School in [place]. Married '90 - marriage is in very good shape '12, No children.  Regular Exercise -  YES, body builder. Has aging parents in the [place]  who are thinking of "snow birding" in [place]. Offerred medical services for them if needed (Nov '11)   Patient is married [name]) and lives at home with her husband.   Patient is working Administrator, arts work.   Patient has a high school education.   Patient is right-handed.   Patient does not drink any caffeine.  Social Determinants of Health   Financial Resource Strain:    Difficulty of Paying Living Expenses: Not on file  Food Insecurity:    Worried About Charity fundraiser in the Last Year: Not on file   [employer] of Food in the Last Year: Not on file  Transportation Needs:    Lack of Transportation (Medical): Not on file   Lack of Transportation (Non-Medical): Not on file  Physical Activity:    Days of Exercise per Week: Not on file   Minutes of Exercise per Session: Not on file  Stress:    Feeling of Stress : Not on file  Social Connections:    Frequency of Communication with Friends and Family: Not on file   Frequency of Social Gatherings with Friends and Family: Not on file   Attends Religious Services: Not on file   Active Member of Clubs or Organizations: Not on file   Attends Archivist Meetings: Not on file   Marital Status: Not on file  Intimate Partner Violence:    Fear of Current or Ex-Partner: Not on file   Emotionally Abused: Not on file   Physically Abused: Not on file   Sexually Abused: Not on file    Family History  Problem Relation Age of Onset   Hyperlipidemia Father    COPD Father        Smoker, deceased Jun 14, 2014   Hypertension Father    Hyperlipidemia Mother    Hypertension Mother    Headache Sister    Diabetes Neg Hx    Colon cancer Neg Hx    Breast cancer Neg Hx    Coronary artery disease Neg Hx    Esophageal cancer Neg Hx    Stomach cancer Neg Hx    Pancreatic cancer Neg Hx    Liver disease Neg Hx     Past Medical History:  Diagnosis Date   Allergic rhinitis, cause unspecified     Backache, unspecified    Benign fasciculation-cramp syndrome 12/03/2012    Worsening with  stress, fatigue .   Calculus of kidney    Conversion disorder    Cotton wool spots    Esophageal reflux    Floater, vitreous, left June 14, 2016   GERD (gastroesophageal reflux disease)    Hemorrhage of rectum and anus    Internal hemorrhoids without mention of complication    Irritable bowel syndrome    Microscopic hematuria    Migraine headache with aura    Mild aortic insufficiency    Mild tricuspid regurgitation    Osteopenia    Other plastic surgery for unacceptable cosmetic appearance    Inj tx fllers/expander   Personal history of urinary calculi    Postmenopausal atrophic vaginitis    Premature atrial contractions    PVC (premature ventricular contraction)    a. Event monitor 2013.   Unspecified constipation    Vasovagal syncope     Past Surgical History:  Procedure Laterality Date   BREAST BIOPSY Right    Cosmetic Procedures w/Injection therapy     SKIN CANCER EXCISION Left    thigh   WISDOM TOOTH EXTRACTION      Current Outpatient Medications  Medication Sig Dispense Refill   ALPRAZolam (XANAX) 0.25 MG tablet 0.5-1 po bid prn anxiety 30 tablet 1   Botulinum Toxin Type A, Cosm, (BOTOX COSMETIC) 50 UNITS SOLR Inject 4-6 Units into the muscle. 3 month  injection     Cholecalciferol (VITAMIN D3 PO) Take 50 mcg by mouth  daily.     conjugated estrogens (PREMARIN) vaginal cream Use PV once every 2-3 days 90 g 2   Cyanocobalamin (VITAMIN B-12) 1000 MCG SUBL PLACE 1 TABLET (1,000 MCG TOTAL) UNDER THE TONGUE DAILY. 100 tablet 3   dexlansoprazole (DEXILANT) 60 MG capsule Take 1 capsule (60 mg total) by mouth daily. 90 capsule 3   diltiazem (CARDIZEM) 30 MG tablet Take 1 tablet (30 mg total) by mouth daily as needed. 30 tablet 1   EPIPEN 2-PAK 0.3 MG/0.3ML SOAJ injection See admin instructions. Reported on 10/08/2015  1   flecainide (TAMBOCOR) 50 MG tablet  Take 50 mg by mouth as needed (take 1 tablet as needed for fast heart rate).     fluocinonide cream (LIDEX) 1.74 % Apply 1 application topically 2 (two) times daily. 90 g 2   linaclotide (LINZESS) 72 MCG capsule Take 1 capsule (72 mcg total) by mouth daily before breakfast. 90 capsule 3   loratadine (CLARITIN) 10 MG tablet Take 1 tablet (10 mg total) by mouth daily. 90 tablet 3   mometasone (NASONEX) 50 MCG/ACT nasal spray Place 2 sprays into the nose daily. 17 g 12   tretinoin (RETIN-A) 0.05 % cream Apply 1 application topically at bedtime.     triamcinolone ointment (KENALOG) 0.5 % Apply 1 application topically 2 (two) times daily. 30 g 0   UNABLE TO FIND once a week. Med Name: allergy shots     urea (CARMOL) 40 % CREA 1 APPLICATION AS NEEDED ONCE A DAY EXTERNALLY 30 DAYS     valACYclovir (VALTREX) 500 MG tablet Take 1 tablet (500 mg total) by mouth 2 (two) times daily. 10 tablet 2   No current facility-administered medications for this visit.    Allergies as of 04/11/2020 - Review Complete 04/11/2020  Allergen Reaction Noted   Codeine Other (See Comments)    Doxycycline Other (See Comments) 05/31/2016   Epinephrine  08/12/2012   Neomycin  03/08/2015   Penicillins Hives and Other (See Comments)    Protonix [pantoprazole sodium]  06/06/2017   Sulfonamide derivatives Other (See Comments)    Zithromax [azithromycin] Rash 08/18/2014    Vitals: BP 119/71    Pulse 80    Ht 5\' 2"  (1.575 m)    Wt 104 lb (47.2 kg)    BMI 19.02 kg/m  Last Weight:  Wt Readings from Last 1 Encounters:  04/11/20 104 lb (47.2 kg)   Last Height:   Ht Readings from Last 1 Encounters:  04/11/20 5\' 2"  (9.449 m)   Vision Screening:  Left eye with correction 25/20.  Right eye with correction 25/20. Monovision, contacts.   Physical exam:  General: The patient is awake, alert and appears not in acute distress. The patient is well groomed. Head: Normocephalic, atraumatic. [name] is supple.  Mallampati 1- wide open-  [name] circumference 12.5 inches, No retrognathia. TMJ click on the right.  Cardiovascular:  Regular rate and rhythm, without  murmurs or carotid bruit, and without distended [name] veins.  Respiratory: Lungs are clear to auscultation. Skin:  Without evidence of edema, or rash Trunk: BMI low normal. 19 kg/m2.   Neurologic exam : The patient is awake and alert, oriented to place and time.   Memory subjective described as intact. There is a normal attention span & concentration ability.  Speech is fluent without  dysarthria, dysphonia or aphasia.  Mood and affect are appropriate.  Cranial nerves: Pupils are equal and briskly reactive to light. Extraocular movements  in vertical and horizontal planes  intact and without nystagmus.  Hearing to finger rub intact.  Facial sensation intact to fine touch. Facial motor strength is symmetric and tongue and uvula move midline. Motor exam: Normal tone and normal muscle bulk and symmetric normal strength in all extremities. No fasciculations.  Sensory:  Fine touch, pinprick and vibration were normal  in all extremities.  Coordination: Rapid alternating movements in the fingers/hands is normal.  Finger-to-nose maneuver tested and normal without evidence of ataxia, dysmetria or tremor. Gait and station: Patient walks without assistive device.  Deep tendon reflexes: in the upper and lower extremities are symmetric and intact.   Assessment:  After physical and neurologic examination, review of laboratory studies, imaging, neurophysiology testing and pre-existing records.  Benign fasciculations resolved ! She stayed off caffeine. She has rarely palpitations, since reflux is controlled. Dr. Henrene Pastor is her GI specialist. She is not drinking alcohol, not eating late.  Her orthodontist noted some TMJ click, will address snoring in a device- she has no need for CPAP. Her HST was negative for sleep apnea.    Headaches resolved since August 2018  after she stopped eating chocolate covered almonds.  Diplopia horizontal, related to stress situations isolated events- , no longer present since early 2018. Amaurosis fugax episode versus complicated migraine- negative dopplers in 2019.   Yearly follow up next in 2022    Larey Seat, MD   CC Dr. Loni Muse. Plotnikov

## 2020-04-13 DIAGNOSIS — J3089 Other allergic rhinitis: Secondary | ICD-10-CM | POA: Diagnosis not present

## 2020-04-13 DIAGNOSIS — J301 Allergic rhinitis due to pollen: Secondary | ICD-10-CM | POA: Diagnosis not present

## 2020-04-15 ENCOUNTER — Telehealth: Payer: Self-pay | Admitting: Cardiovascular Disease

## 2020-04-15 NOTE — Telephone Encounter (Signed)
Answered all questions.

## 2020-04-15 NOTE — Telephone Encounter (Signed)
Patient states she is returning a call to [place].

## 2020-04-16 ENCOUNTER — Ambulatory Visit: Payer: Medicare Other

## 2020-04-18 DIAGNOSIS — J3089 Other allergic rhinitis: Secondary | ICD-10-CM | POA: Diagnosis not present

## 2020-04-18 DIAGNOSIS — J301 Allergic rhinitis due to pollen: Secondary | ICD-10-CM | POA: Diagnosis not present

## 2020-04-21 DIAGNOSIS — J3089 Other allergic rhinitis: Secondary | ICD-10-CM | POA: Diagnosis not present

## 2020-04-21 DIAGNOSIS — R21 Rash and other nonspecific skin eruption: Secondary | ICD-10-CM | POA: Diagnosis not present

## 2020-04-21 DIAGNOSIS — J301 Allergic rhinitis due to pollen: Secondary | ICD-10-CM | POA: Diagnosis not present

## 2020-04-26 DIAGNOSIS — Z872 Personal history of diseases of the skin and subcutaneous tissue: Secondary | ICD-10-CM | POA: Diagnosis not present

## 2020-04-26 DIAGNOSIS — L309 Dermatitis, unspecified: Secondary | ICD-10-CM | POA: Diagnosis not present

## 2020-04-27 ENCOUNTER — Ambulatory Visit (INDEPENDENT_AMBULATORY_CARE_PROVIDER_SITE_OTHER): Payer: Medicare Other | Admitting: Cardiovascular Disease

## 2020-04-27 ENCOUNTER — Other Ambulatory Visit: Payer: Self-pay

## 2020-04-27 ENCOUNTER — Encounter: Payer: Self-pay | Admitting: Cardiovascular Disease

## 2020-04-27 VITALS — BP 120/78 | HR 74 | Ht 62.0 in | Wt 104.6 lb

## 2020-04-27 DIAGNOSIS — I471 Supraventricular tachycardia: Secondary | ICD-10-CM | POA: Diagnosis not present

## 2020-04-27 DIAGNOSIS — I351 Nonrheumatic aortic (valve) insufficiency: Secondary | ICD-10-CM | POA: Diagnosis not present

## 2020-04-27 NOTE — Patient Instructions (Addendum)
Medication Instructions:  No changes today *If you need a refill on your cardiac medications before your next appointment, please call your pharmacy*   Lab Work: none If you have labs (blood work) drawn today and your tests are completely normal, you will receive your results only by: [name] MyChart Message (if you have MyChart) OR . A paper copy in the mail If you have any lab test that is abnormal or we need to change your treatment, we will call you to review the results.   Testing/Procedures:  ECHO DUE MARCH 2022 Your physician has requested that you have an echocardiogram. Echocardiography is a painless test that uses sound waves to create images of your heart. It provides your doctor with information about the size and shape of your heart and how well your heart's chambers and valves are working. This procedure takes approximately one hour. There are no restrictions for this procedure.    Follow-Up: At [facility], you and your health needs are our priority.  As part of our continuing mission to provide you with exceptional heart care, we have created designated Provider Care Teams.  These Care Teams include your primary Cardiologist (physician) and Advanced Practice Providers (APPs -  Physician Assistants and Nurse Practitioners) who all work together to provide you with the care you need, when you need it.  Your next appointment:   12 month(s)  The format for your next appointment:   In Person  Provider:   You may see Lauree Chandler, MD or one of the following Advanced Practice Providers on your designated Care Team:    Melina Copa, PA-C  Ermalinda Barrios, PA-C   Other Instructions You have been referred to [facility]   Addendum 04/27/20 11:00 am patient forgot to bring her calendar and is going to call in to schedule EP appointment and March 2022 echo.  MWilson, Therapist, sports

## 2020-04-27 NOTE — Progress Notes (Signed)
Chief Complaint  Patient presents with  . Follow-up    SVT   History of Present Illness: 69 yo female with history of SVT, premature atrial contractions, palpitations, vasovagal syncope, mild aortic valve insufficiency, mild mitral regurgitation,irritable bowel syndrome and GERD who is here today for cardiac follow up. She has had palpitations for years. Cardiac monitor April 2020 showed a short run of SVT. Exercise stress test 05/07/14 with good exercise tolerance, no ischemia. Echo 08/25/18 with LVEF=55-60%, mild AI, unchanged. 30 day event monitor March 2020 with sinus rhythm with PACs and PVCs. She wished to wear a second 30 day monitor in April 2020 and this showed an episode of SVT.    She is here today for follow up. The patient denies any chest pain, dyspnea, lower extremity edema, orthopnea, PND, dizziness, near syncope or syncope. She now reports having several episodes of SVT over the summer with an episode in June 2021 and July 2021, both lasting up to 30 minutes. She has a Doctor, hospital for her phone and has two episodes of tachycardia in August and October 2021. Her reading from the company was atrial fibrillation in October but the strips look like SVT to me. She feels weak and tired when her heart races.   Primary Care Physician: Cassandria Anger, MD  Past Medical History:  Diagnosis Date  . Allergic rhinitis, cause unspecified   . Backache, unspecified   . Benign fasciculation-cramp syndrome 12/03/2012    Worsening with  stress, fatigue .  [name] Calculus of kidney   . Conversion disorder   . Cotton wool spots   . Esophageal reflux   . Floater, vitreous, left 05/2016  . GERD (gastroesophageal reflux disease)   . Hemorrhage of rectum and anus   . Internal hemorrhoids without mention of complication   . Irritable bowel syndrome   . Microscopic hematuria   . Migraine headache with aura   . Mild aortic insufficiency   . Mild tricuspid regurgitation   . Osteopenia   .  Other plastic surgery for unacceptable cosmetic appearance    Inj tx fllers/expander  . Personal history of urinary calculi   . Postmenopausal atrophic vaginitis   . Premature atrial contractions   . PVC (premature ventricular contraction)    a. Event monitor 2013.  [name] Unspecified constipation   . Vasovagal syncope     Past Surgical History:  Procedure Laterality Date  . BREAST BIOPSY Right   . Cosmetic Procedures w/Injection therapy    . SKIN CANCER EXCISION Left    thigh  . WISDOM TOOTH EXTRACTION      Current Outpatient Medications  Medication Sig Dispense Refill  . ALPRAZolam (XANAX) 0.25 MG tablet 0.5-1 po bid prn anxiety 30 tablet 1  . Botulinum Toxin Type A, Cosm, (BOTOX COSMETIC) 50 UNITS SOLR Inject 4-6 Units into the muscle. 3 month  injection    . Cholecalciferol (VITAMIN D3 PO) Take 50 mcg by mouth daily.    [name] conjugated estrogens (PREMARIN) vaginal cream Use PV once every 2-3 days 90 g 2  . Cyanocobalamin (VITAMIN B-12) 1000 MCG SUBL PLACE 1 TABLET (1,000 MCG TOTAL) UNDER THE TONGUE DAILY. 100 tablet 3  . dexlansoprazole (DEXILANT) 60 MG capsule Take 1 capsule (60 mg total) by mouth daily. 90 capsule 3  . diltiazem (CARDIZEM) 30 MG tablet Take 1 tablet (30 mg total) by mouth daily as needed. 30 tablet 1  . EPIPEN 2-PAK 0.3 MG/0.3ML SOAJ injection See admin instructions. Reported on  10/08/2015  1  . flecainide (TAMBOCOR) 50 MG tablet Take 50 mg by mouth as needed (take 1 tablet as needed for fast heart rate).    . fluocinonide cream (LIDEX) 1.54 % Apply 1 application topically 2 (two) times daily. 90 g 2  . linaclotide (LINZESS) 72 MCG capsule Take 1 capsule (72 mcg total) by mouth daily before breakfast. 90 capsule 3  . loratadine (CLARITIN) 10 MG tablet Take 1 tablet (10 mg total) by mouth daily. 90 tablet 3  . mometasone (NASONEX) 50 MCG/ACT nasal spray Place 2 sprays into the nose daily. 17 g 12  . tretinoin (RETIN-A) 0.05 % cream Apply 1 application topically at  bedtime.    . triamcinolone ointment (KENALOG) 0.5 % Apply 1 application topically 2 (two) times daily. 30 g 0  . UNABLE TO FIND once a week. Med Name: allergy shots    . urea (CARMOL) 40 % CREA 1 APPLICATION AS NEEDED ONCE A DAY EXTERNALLY 30 DAYS    . valACYclovir (VALTREX) 500 MG tablet Take 1 tablet (500 mg total) by mouth 2 (two) times daily. 10 tablet 2   No current facility-administered medications for this visit.    Allergies  Allergen Reactions  . Codeine Other (See Comments)    Doesn't remeber  . Doxycycline Other (See Comments)  . Epinephrine     Heart racing   . Neomycin     Topical rash from cream  . Penicillins Hives and Other (See Comments)    Has patient had a PCN reaction causing immediate rash, facial/tongue/throat swelling, SOB or lightheadedness with hypotension: Yes Has patient had a PCN reaction causing severe rash involving mucus membranes or skin necrosis: Yes Has patient had a PCN reaction that required hospitalization No Has patient had a PCN reaction occurring within the last 10 years: No If all of the above answers are "NO", then may proceed with Cephalosporin use.   . Protonix [Pantoprazole Sodium]     ?HAs  . Sulfonamide Derivatives Other (See Comments)    Doesn't remember   . Zithromax [Azithromycin] Rash    Social History   Socioeconomic History  . Marital status: Married    Spouse name: [name]  . Number of children: 0  . Years of education: 35  . Highest education level: Not on file  Occupational History  . Occupation: OFFICE liasion    Employer: [employer]  . Occupation: Engineer, structural  . Occupation: OFFICE [employer]    Employer: [employer]  Tobacco Use  . Smoking status: Never Smoker  . Smokeless tobacco: Never Used  Vaping Use  . Vaping Use: Never used  Substance and Sexual Activity  . Alcohol use: No  . Drug use: No  . Sexual activity: Not on file  Other Topics Concern  . Not on file  Social History  Narrative   Vocational School in [place]. Married '90 - marriage is in very good shape '12, No children.  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 Offerred medical services for them if needed (Nov '11)   Patient is married [name]) and lives at home with her husband.   Patient is working Administrator, arts work.   Patient has a high school education.   Patient is right-handed.   Patient does not drink any caffeine.      Social Determinants of Health   Financial Resource Strain:   . Difficulty  of Paying Living Expenses: Not on file  Food Insecurity:   . Worried About Charity fundraiser in the Last Year: Not on file  . Ran Out of Food in the Last Year: Not on file  Transportation Needs:   . Lack of Transportation (Medical): Not on file  . Lack of Transportation (Non-Medical): Not on file  Physical Activity:   . Days of Exercise per Week: Not on file  . Minutes of Exercise per Session: Not on file  Stress:   . Feeling of Stress : Not on file  Social Connections:   . Frequency of Communication with Friends and Family: Not on file  . Frequency of Social Gatherings with Friends and Family: Not on file  . Attends Religious Services: Not on file  . Active Member of Clubs or Organizations: Not on file  . Attends Archivist Meetings: Not on file  . Marital Status: Not on file  Intimate Partner Violence:   . Fear of Current or Ex-Partner: Not on file  . Emotionally Abused: Not on file  . Physically Abused: Not on file  . Sexually Abused: Not on file    Family History  Problem Relation Age of Onset  . Hyperlipidemia Father   . COPD Father        Smoker, deceased 2014/07/02  . Hypertension Father   . Hyperlipidemia Mother   . Hypertension Mother   . Headache Sister   . Diabetes Neg Hx   . Colon cancer Neg Hx   . Breast cancer Neg Hx   . Coronary artery disease Neg Hx   . Esophageal cancer Neg  Hx   . Stomach cancer Neg Hx   . Pancreatic cancer Neg Hx   . Liver disease Neg Hx     Review of Systems:  As stated in the HPI and otherwise negative.   BP 120/78   Pulse 74   Ht 5\' 2"  (1.575 m)   Wt 104 lb 9.6 oz (47.4 kg)   SpO2 98%   BMI 19.13 kg/m   Physical Examination:  General: Well developed, well nourished, NAD  HEENT: OP clear, mucus membranes moist  SKIN: warm, dry. No rashes. Neuro: No focal deficits  Musculoskeletal: Muscle strength 5/5 all ext  Psychiatric: Mood and affect normal  Neck: No JVD, no carotid bruits, no thyromegaly, no lymphadenopathy.  Lungs:Clear bilaterally, no wheezes, rhonci, crackles Cardiovascular: Regular rate and rhythm. No murmurs, gallops or rubs. Abdomen:Soft. Bowel sounds present. Non-tender.  Extremities: No lower extremity edema. Pulses are 2 + in the bilateral DP/PT.  Echo March 2020: 1. The left ventricle has normal systolic function, with an ejection fraction of 55-60%. The cavity size was normal. Left ventricular diastolic parameters were normal.  2. The right ventricle has normal systolic function. The cavity was normal. There is no increase in right ventricular wall thickness.  3. The tricuspid valve is grossly normal.  4. The aortic valve is tricuspid Mild thickening of the aortic valve Aortic valve regurgitation is mild by color flow Doppler.  5. Normal LV function; sclerotic aortic valve with mild AI.  EKG:  EKG is ordered today. The ekg ordered today demonstrates   Recent Labs: 01/11/2020: ALT 19; TSH 2.14 02/17/2020: BUN 19; Creat 0.88; Hemoglobin 13.3; Platelets 201; Potassium 4.5; Sodium 141   Lipid Panel    Component Value Date/Time   CHOL 208 (H) 01/11/2020 0800   TRIG 81 01/11/2020 0800   TRIG 66 06/25/2006 0741   HDL 75  01/11/2020 0800   CHOLHDL 2.8 01/11/2020 0800   VLDL 22.6 01/13/2019 0929   LDLCALC 115 (H) 01/11/2020 0800     Wt Readings from Last 3 Encounters:  04/27/20 104 lb 9.6 oz (47.4 kg)    04/11/20 104 lb (47.2 kg)  03/09/20 99 lb (44.9 kg)     Other studies Reviewed: Additional studies/ records that were reviewed today include:  Review of the above records demonstrates:    Assessment and Plan:   1. Palpitations/PACs/PVCs/SVT: SVT noted on cardiac monitor in 2020. She has had 4-5 other episodes since June 2021, some lasting up to 45 minutes. This is documented on her K[employer] app. She has not taken daily Cardizem. Continue Cardizem and Flecainide as needed. Vagal maneuvers are reviewed today.    -Will refer to EP to discuss her SVT and with more frequent episodes, the possibility of ablation.   2. Aortic valve insufficiency: Mild by echo March 2020. Will repeat echo March 2022.    3. Carotid artery disease: Mild disease by u/s August 2019.   Current medicines are reviewed at length with the patient today.  The patient does not have concerns regarding medicines.  The following changes have been made:  no change  Labs/ tests ordered today include:   Orders Placed This Encounter  Procedures  . Ambulatory referral to Cardiac Electrophysiology  . ECHOCARDIOGRAM COMPLETE     Disposition:   F/U with me in 12  months   Signed, Lauree Chandler, MD 04/27/2020 11:06 AM    [facility] [address], [place], [place]  [address] Phone: [phone]; Fax: [phone]

## 2020-04-28 ENCOUNTER — Telehealth: Payer: Self-pay | Admitting: Cardiovascular Disease

## 2020-04-28 NOTE — Telephone Encounter (Signed)
Patient states she would like to further discuss scheduling with an EP provider. Please call.

## 2020-04-28 NOTE — Telephone Encounter (Signed)
Please advise 

## 2020-05-01 ENCOUNTER — Ambulatory Visit: Payer: Medicare Other

## 2020-05-02 DIAGNOSIS — J3089 Other allergic rhinitis: Secondary | ICD-10-CM | POA: Diagnosis not present

## 2020-05-02 DIAGNOSIS — J301 Allergic rhinitis due to pollen: Secondary | ICD-10-CM | POA: Diagnosis not present

## 2020-05-06 ENCOUNTER — Other Ambulatory Visit: Payer: Medicare Other

## 2020-05-06 ENCOUNTER — Other Ambulatory Visit: Payer: Self-pay

## 2020-05-06 ENCOUNTER — Other Ambulatory Visit: Payer: Self-pay | Admitting: Internal Medicine

## 2020-05-06 DIAGNOSIS — T881XXS Other complications following immunization, not elsewhere classified, sequela: Secondary | ICD-10-CM

## 2020-05-09 DIAGNOSIS — J3089 Other allergic rhinitis: Secondary | ICD-10-CM | POA: Diagnosis not present

## 2020-05-09 DIAGNOSIS — J301 Allergic rhinitis due to pollen: Secondary | ICD-10-CM | POA: Diagnosis not present

## 2020-05-10 LAB — SARS-COV-2 SEMI-QUANTITATIVE TOTAL ANTIBODY, SPIKE: SARS COV2 AB, Total Spike Semi QN: >2500 U/mL — ABNORMAL HIGH (ref <0.8)

## 2020-05-11 DIAGNOSIS — H11121 Conjunctival concretions, right eye: Secondary | ICD-10-CM | POA: Diagnosis not present

## 2020-05-11 DIAGNOSIS — J3089 Other allergic rhinitis: Secondary | ICD-10-CM | POA: Diagnosis not present

## 2020-05-11 DIAGNOSIS — J301 Allergic rhinitis due to pollen: Secondary | ICD-10-CM | POA: Diagnosis not present

## 2020-05-14 NOTE — Progress Notes (Signed)
Electrophysiology Office Note   Date:  05/16/2020   ID:  [name], DOB 07-30-1950, MRN [id number]  PCP:  Tresa Garter, MD  Cardiologist:  Clifton James Primary Electrophysiologist:  Amila Callies Jorja Loa, MD    Chief Complaint: SVT   History of Present Illness: [name] is a 69 y.o. female who is being seen today for the evaluation of SVT at the request of Kathleene Hazel*. Presenting today for electrophysiology evaluation.  She has a history of SVT, PACs, vasovagal syncope, aortic insufficiency, mitral regurgitation, IBS, and GERD.  She has been having palpitations for many years.  Cardiac monitor April 2020 showed a short run of SVT.  She has been having several episodes of SVT with an episode in June and July lasting up to 20 minutes.  She has cardia app for her phone that is shown to episodes of tachycardia.  She feels weak and fatigued when she has palpitations.  Today, she denies symptoms of palpitations, chest pain, shortness of breath, orthopnea, PND, lower extremity edema, claudication, dizziness, presyncope, syncope, bleeding, or neurologic sequela. The patient is tolerating medications without difficulties. Her SVT burden has gone up over the last few months. She has an episode 1 or 2 times a month. She has as needed diltiazem and flecainide but has not taken a dose. She has tried vagal maneuvers which intermittently work. Her episodes at times last up to an hour.   Past Medical History:  Diagnosis Date  . Allergic rhinitis, cause unspecified   . Backache, unspecified   . Benign fasciculation-cramp syndrome 12/03/2012    Worsening with  stress, fatigue .  [name] Calculus of kidney   . Conversion disorder   . Cotton wool spots   . Esophageal reflux   . Floater, vitreous, left 05/2016  . GERD (gastroesophageal reflux disease)   . Hemorrhage of rectum and anus   . Internal hemorrhoids without mention of complication   . Irritable bowel syndrome    . Microscopic hematuria   . Migraine headache with aura   . Mild aortic insufficiency   . Mild tricuspid regurgitation   . Osteopenia   . Other plastic surgery for unacceptable cosmetic appearance    Inj tx fllers/expander  . Personal history of urinary calculi   . Postmenopausal atrophic vaginitis   . Premature atrial contractions   . PVC (premature ventricular contraction)    a. Event monitor 2013.  [name] Unspecified constipation   . Vasovagal syncope    Past Surgical History:  Procedure Laterality Date  . BREAST BIOPSY Right   . Cosmetic Procedures w/Injection therapy    . SKIN CANCER EXCISION Left    thigh  . WISDOM TOOTH EXTRACTION       Current Outpatient Medications  Medication Sig Dispense Refill  . ALPRAZolam (XANAX) 0.25 MG tablet 0.5-1 po bid prn anxiety 30 tablet 1  . Botulinum Toxin Type A, Cosm, (BOTOX COSMETIC) 50 UNITS SOLR Inject 4-6 Units into the muscle. 3 month  injection    . Cholecalciferol (VITAMIN D3 PO) Take 50 mcg by mouth daily.    [name] conjugated estrogens (PREMARIN) vaginal cream Use PV once every 2-3 days 90 g 2  . Cyanocobalamin (VITAMIN B-12) 1000 MCG SUBL PLACE 1 TABLET (1,000 MCG TOTAL) UNDER THE TONGUE DAILY. 100 tablet 3  . dexlansoprazole (DEXILANT) 60 MG capsule Take 1 capsule (60 mg total) by mouth daily. 90 capsule 3  . diltiazem (CARDIZEM) 30 MG tablet Take 1 tablet (30 mg total) by mouth  daily as needed. 30 tablet 1  . EPIPEN 2-PAK 0.3 MG/0.3ML SOAJ injection See admin instructions. Reported on 10/08/2015  1  . flecainide (TAMBOCOR) 50 MG tablet Take 50 mg by mouth as needed (take 1 tablet as needed for fast heart rate).    . fluocinonide cream (LIDEX) 0.05 % Apply 1 application topically 2 (two) times daily. 90 g 2  . linaclotide (LINZESS) 72 MCG capsule Take 1 capsule (72 mcg total) by mouth daily before breakfast. 90 capsule 3  . loratadine (CLARITIN) 10 MG tablet Take 1 tablet (10 mg total) by mouth daily. 90 tablet 3  . mometasone  (NASONEX) 50 MCG/ACT nasal spray Place 2 sprays into the nose daily. 17 g 12  . tretinoin (RETIN-A) 0.05 % cream Apply 1 application topically at bedtime.    . triamcinolone ointment (KENALOG) 0.5 % Apply 1 application topically 2 (two) times daily. 30 g 0  . UNABLE TO FIND once a week. Med Name: allergy shots    . urea (CARMOL) 40 % CREA 1 APPLICATION AS NEEDED ONCE A DAY EXTERNALLY 30 DAYS    . valACYclovir (VALTREX) 500 MG tablet Take 1 tablet (500 mg total) by mouth 2 (two) times daily. 10 tablet 2  . diltiazem (CARDIZEM CD) 180 MG 24 hr capsule Take 1 capsule (180 mg total) by mouth daily. 30 capsule 3   No current facility-administered medications for this visit.    Allergies:   Codeine, Doxycycline, Epinephrine, Neomycin, Penicillins, Protonix [pantoprazole sodium], Sulfonamide derivatives, and Zithromax [azithromycin]   Social History:  The patient  reports that she has never smoked. She has never used smokeless tobacco. She reports that she does not drink alcohol and does not use drugs.   Family History:  The patient's family history includes COPD in her father; Headache in her sister; Hyperlipidemia in her father and mother; Hypertension in her father and mother.    ROS:  Please see the history of present illness.   Otherwise, review of systems is positive for none.   All other systems are reviewed and negative.    PHYSICAL EXAM: VS:  BP 138/70   Pulse 74   Ht 5\' 2"  (1.575 m)   Wt 106 lb (48.1 kg)   SpO2 98%   BMI 19.39 kg/m  , BMI Body mass index is 19.39 kg/m. GEN: Well nourished, well developed, in no acute distress  HEENT: normal  Neck: no JVD, carotid bruits, or masses Cardiac: RRR; no murmurs, rubs, or gallops,no edema  Respiratory:  clear to auscultation bilaterally, normal work of breathing GI: soft, nontender, nondistended, + BS MS: no deformity or atrophy  Skin: warm and dry Neuro:  Strength and sensation are intact Psych: euthymic mood, full affect  EKG:   EKG is ordered today. Personal review of the ekg ordered shows sinus rhythm, rate 73  Recent Labs: 01/11/2020: ALT 19; TSH 2.14 02/17/2020: BUN 19; Creat 0.88; Hemoglobin 13.3; Platelets 201; Potassium 4.5; Sodium 141    Lipid Panel     Component Value Date/Time   CHOL 208 (H) 01/11/2020 0800   TRIG 81 01/11/2020 0800   TRIG 66 06/25/2006 0741   HDL 75 01/11/2020 0800   CHOLHDL 2.8 01/11/2020 0800   VLDL 22.6 01/13/2019 0929   LDLCALC 115 (H) 01/11/2020 0800     Wt Readings from Last 3 Encounters:  05/16/20 106 lb (48.1 kg)  04/27/20 104 lb 9.6 oz (47.4 kg)  04/11/20 104 lb (47.2 kg)      Other  studies Reviewed: Additional studies/ records that were reviewed today include: TTE 08/25/18  Review of the above records today demonstrates:  1. The left ventricle has normal systolic function, with an ejection  fraction of 55-60%. The cavity size was normal. Left ventricular diastolic  parameters were normal.  2. The right ventricle has normal systolic function. The cavity was  normal. There is no increase in right ventricular wall thickness.  3. The tricuspid valve is grossly normal.  4. The aortic valve is tricuspid Mild thickening of the aortic valve  Aortic valve regurgitation is mild by color flow Doppler.  5. Normal LV function; sclerotic aortic valve with mild AI.   Cardiac monitor 10/27/2018 personally reviewed Sinus rhythm Premature atrial contractions One short run of supraventricular tachycardia  (one minute)  ASSESSMENT AND PLAN:  1.  SVT: Currently on diltiazem and flecainide as needed. Her symptoms have been increasing over the last few months. She has not yet taken any of her as needed medications. I told her that we could either go with ablation or medications. At this point she would like to hold off on ablation. We [name] plan to start her on diltiazem 180 mg daily. If she does have further episodes, ablation would be reasonable.  2.  Aortic insufficiency:  Mild by echo in 2020.  Case discussed with primary cardiology  Current medicines are reviewed at length with the patient today.   The patient does not have concerns regarding her medicines.  The following changes were made today: Start diltiazem  Labs/ tests ordered today include:  Orders Placed This Encounter  Procedures  . EKG 12-Lead     Disposition:   FU with [name] 3 months  Signed, Linn Clavin Jorja Loa, MD  05/16/2020 10:36 AM     [facility] [address] [place] [address] [phone] (office) [phone] (fax)

## 2020-05-16 ENCOUNTER — Other Ambulatory Visit: Payer: Self-pay

## 2020-05-16 ENCOUNTER — Ambulatory Visit (INDEPENDENT_AMBULATORY_CARE_PROVIDER_SITE_OTHER): Payer: Medicare Other | Admitting: Cardiology

## 2020-05-16 ENCOUNTER — Encounter: Payer: Self-pay | Admitting: Cardiology

## 2020-05-16 VITALS — BP 138/70 | HR 74 | Ht 62.0 in | Wt 106.0 lb

## 2020-05-16 DIAGNOSIS — I471 Supraventricular tachycardia: Secondary | ICD-10-CM | POA: Diagnosis not present

## 2020-05-16 MED ORDER — DILTIAZEM HCL ER COATED BEADS 180 MG PO CP24: 180.0000 mg | ORAL_CAPSULE | Freq: Every day | ORAL | 3 refills | Status: DC

## 2020-05-16 NOTE — Patient Instructions (Signed)
Medication Instructions:  Your physician has recommended you make the following change in your medication:  1. START Diltiazem 180 mg once daily  *If you need a refill on your cardiac medications before your next appointment, please call your pharmacy*   Lab Work: None ordered   Testing/Procedures: None ordered   Follow-Up: At [facility], you and your health needs are our priority.  As part of our continuing mission to provide you with exceptional heart care, we have created designated Provider Care Teams.  These Care Teams include your primary Cardiologist (physician) and Advanced Practice Providers (APPs -  Physician Assistants and Nurse Practitioners) who all work together to provide you with the care you need, when you need it.  Your next appointment:   3 month(s)  The format for your next appointment:   In Person  Provider:   Allegra Lai, MD    Thank you for choosing [facility]!!   Trinidad Curet, RN [phone]   Other Instructions   Diltiazem Oral Tablets What is this medicine? DILTIAZEM (dil TYE a zem) is a calcium channel blocker. It relaxes your blood vessels and decreases the amount of work the heart has to do. It treats and/or prevents chest pain (also called angina). This medicine may be used for other purposes; ask your health care provider or pharmacist if you have questions. COMMON BRAND NAME(S): Cardizem What should I tell my health care provider before I take this medicine? They need to know if you have any of these conditions:  heart attack  heart disease  irregular heartbeat or rhythm  low blood pressure  an unusual or allergic reaction to diltiazem, other drugs, foods, dyes, or preservatives  pregnant or trying to get pregnant  breast-feeding How should I use this medicine? Take this drug by mouth. Take it as directed on the prescription label at the same time every day. Keep taking it unless your health care provider tells  you to stop. Talk to your health care provider about the use of this drug in children. Special care may be needed. Overdosage: If you think you have taken too much of this medicine contact a poison control center or emergency room at once. NOTE: This medicine is only for you. Do not share this medicine with others. What if I miss a dose? If you miss a dose, take it as soon as you can. If it is almost time for your next dose, take only that dose. Do not take double or extra doses. What may interact with this medicine? Do not take this medicine with any of the following:  cisapride  hawthorn  pimozide  ranolazine  red yeast rice This medicine may also interact with the following medications:  buspirone  carbamazepine  cimetidine  cyclosporine  digoxin  local anesthetics or general anesthetics  lovastatin  medicines for anxiety or difficulty sleeping like midazolam and triazolam  medicines for high blood pressure or heart problems  quinidine  rifampin, rifabutin, or rifapentine This list may not describe all possible interactions. Give your health care provider a list of all the medicines, herbs, non-prescription drugs, or dietary supplements you use. Also tell them if you smoke, drink alcohol, or use illegal drugs. Some items may interact with your medicine. What should I watch for while using this medicine? Visit your health care provider for regular checks on your progress. Check your blood pressure as directed. Ask your health care provider what your blood pressure should be. Also, find out when you  should contact him or her. Do not treat yourself for coughs, colds, or pain while you are using this drug without asking your health care provider for advice. Some drugs may increase your blood pressure. This drug may cause serious skin reactions. They can happen weeks to months after starting the drug. Contact your health care provider right away if you notice fevers or  flu-like symptoms with a rash. The rash may be red or purple and then turn into blisters or peeling of the skin. Or, you might notice a red rash with swelling of the face, lips or lymph nodes in your neck or under your arms. You may get drowsy or dizzy. Do not drive, use machinery, or do anything that needs mental alertness until you know how this drug affects you. Do not stand up or sit up quickly, especially if you are an older patient. This reduces the risk of dizzy or fainting spells. What side effects may I notice from receiving this medicine? Side effects that you should report to your doctor or health care provider as soon as possible:  allergic reactions (skin rash, itching or hives; swelling of the face, lips, or tongue)  heart failure (trouble breathing; fast, irregular heartbeat; sudden weight gain; swelling of the ankles, feet, hands; unusually weak or tired)  heartbeat rhythm changes (trouble breathing; chest pain; dizziness; fast, irregular heartbeat; feeling faint or lightheaded, falls)  liver injury (dark yellow or brown urine; general ill feeling or flu-like symptoms; loss of appetite, right upper belly pain; unusually weak or tired, yellowing of the eyes or skin)  low blood pressure (dizziness; feeling faint or lightheaded, falls; unusually weak or tired)  redness, blistering, peeling, or loosening of the skin, including inside the mouth Side effects that usually do not require medical attention (report to your doctor or health care provider if they continue or are bothersome):  changes in sex drive or performance  depressed mood  headache  sudden weight gain  nausea  sudden weight gain  trouble sleeping This list may not describe all possible side effects. Call your doctor for medical advice about side effects. You may report side effects to FDA at 1-800-FDA-1088. Where should I keep my medicine? Keep out of the reach of children and pets. Store at room  temperature between 15 and 30 degrees C (59 and 86 degrees F). Protect from moisture. Keep the container tightly closed. Throw away any unused drug after the expiration date. NOTE: This sheet is a summary. It may not cover all possible information. If you have questions about this medicine, talk to your doctor, pharmacist, or health care provider.  2020 Elsevier/Gold Standard (2019-03-10 18:13:24)

## 2020-05-17 ENCOUNTER — Ambulatory Visit: Payer: Medicare Other

## 2020-05-19 ENCOUNTER — Encounter: Payer: Self-pay | Admitting: Internal Medicine

## 2020-05-19 ENCOUNTER — Other Ambulatory Visit: Payer: Self-pay

## 2020-05-19 ENCOUNTER — Ambulatory Visit (INDEPENDENT_AMBULATORY_CARE_PROVIDER_SITE_OTHER): Payer: Medicare Other | Admitting: Internal Medicine

## 2020-05-19 DIAGNOSIS — R002 Palpitations: Secondary | ICD-10-CM | POA: Diagnosis not present

## 2020-05-19 DIAGNOSIS — F439 Reaction to severe stress, unspecified: Secondary | ICD-10-CM

## 2020-05-19 NOTE — Progress Notes (Signed)
Subjective:  Patient ID: [name], female    DOB: Jun 12, 1951  Age: 69 y.o. MRN: [id number]  CC: Immunizations (DISCUSS VACCINES)   HPI [name] presents for SVT - episodic w/HR up to 166 two episodes per month up to 1 h long. No SOB, syncope etc.   Outpatient Medications Prior to Visit  Medication Sig Dispense Refill   ALPRAZolam (XANAX) 0.25 MG tablet 0.5-1 po bid prn anxiety 30 tablet 1   Botulinum Toxin Type A, Cosm, (BOTOX COSMETIC) 50 UNITS SOLR Inject 4-6 Units into the muscle. 3 month  injection     Cholecalciferol (VITAMIN D3 PO) Take 50 mcg by mouth daily.     conjugated estrogens (PREMARIN) vaginal cream Use PV once every 2-3 days 90 g 2   Cyanocobalamin (VITAMIN B-12) 1000 MCG SUBL PLACE 1 TABLET (1,000 MCG TOTAL) UNDER THE TONGUE DAILY. 100 tablet 3   dexlansoprazole (DEXILANT) 60 MG capsule Take 1 capsule (60 mg total) by mouth daily. 90 capsule 3   diltiazem (CARDIZEM CD) 180 MG 24 hr capsule Take 1 capsule (180 mg total) by mouth daily. 30 capsule 3   diltiazem (CARDIZEM) 30 MG tablet Take 1 tablet (30 mg total) by mouth daily as needed. 30 tablet 1   EPIPEN 2-PAK 0.3 MG/0.3ML SOAJ injection See admin instructions. Reported on 10/08/2015  1   flecainide (TAMBOCOR) 50 MG tablet Take 50 mg by mouth as needed (take 1 tablet as needed for fast heart rate).     fluocinonide cream (LIDEX) 7.12 % Apply 1 application topically 2 (two) times daily. 90 g 2   linaclotide (LINZESS) 72 MCG capsule Take 1 capsule (72 mcg total) by mouth daily before breakfast. 90 capsule 3   loratadine (CLARITIN) 10 MG tablet Take 1 tablet (10 mg total) by mouth daily. 90 tablet 3   mometasone (NASONEX) 50 MCG/ACT nasal spray Place 2 sprays into the nose daily. 17 g 12   tretinoin (RETIN-A) 0.05 % cream Apply 1 application topically at bedtime.     triamcinolone ointment (KENALOG) 0.5 % Apply 1 application topically 2 (two) times daily. 30 g 0   UNABLE TO FIND once a  week. Med Name: allergy shots     urea (CARMOL) 40 % CREA 1 APPLICATION AS NEEDED ONCE A DAY EXTERNALLY 30 DAYS     valACYclovir (VALTREX) 500 MG tablet Take 1 tablet (500 mg total) by mouth 2 (two) times daily. 10 tablet 2   No facility-administered medications prior to visit.    ROS: Review of Systems  Constitutional: Negative for activity change, appetite change, chills, fatigue and unexpected weight change.  HENT: Negative for congestion, mouth sores and sinus pressure.   Eyes: Negative for visual disturbance.  Respiratory: Negative for cough, chest tightness and shortness of breath.   Cardiovascular: Positive for palpitations. Negative for chest pain.  Gastrointestinal: Negative for abdominal pain and nausea.  Genitourinary: Negative for difficulty urinating, frequency and vaginal pain.  Musculoskeletal: Negative for back pain and gait problem.  Skin: Negative for pallor and rash.  Neurological: Negative for dizziness, tremors, weakness, numbness and headaches.  Psychiatric/Behavioral: Negative for confusion, dysphoric mood, sleep disturbance and suicidal ideas. The patient is nervous/anxious.     Objective:  BP 122/82 (BP Location: Left Arm)    Pulse 72    Temp 98.8 F (37.1 C) (Oral)    Wt 105 lb 9.6 oz (47.9 kg)    SpO2 98%    BMI 19.31 kg/m   BP Readings from  Last 3 Encounters:  05/19/20 122/82  05/16/20 138/70  04/27/20 120/78    Wt Readings from Last 3 Encounters:  05/19/20 105 lb 9.6 oz (47.9 kg)  05/16/20 106 lb (48.1 kg)  04/27/20 104 lb 9.6 oz (47.4 kg)    Physical Exam Constitutional:      General: She is not in acute distress.    Appearance: She is well-developed.  HENT:     Head: Normocephalic.     Right Ear: External ear normal.     Left Ear: External ear normal.     Nose: Nose normal.     Mouth/Throat:     Mouth: Oropharynx is clear and moist.  Eyes:     General:        Right eye: No discharge.        Left eye: No discharge.      Conjunctiva/sclera: Conjunctivae normal.     Pupils: Pupils are equal, round, and reactive to light.  Neck:     Thyroid: No thyromegaly.     Vascular: No JVD.     Trachea: No tracheal deviation.  Cardiovascular:     Rate and Rhythm: Normal rate and regular rhythm.     Heart sounds: Normal heart sounds.  Pulmonary:     Effort: No respiratory distress.     Breath sounds: No stridor. No wheezing.  Abdominal:     General: Bowel sounds are normal. There is no distension.     Palpations: Abdomen is soft. There is no mass.     Tenderness: There is no abdominal tenderness. There is no guarding or rebound.  Musculoskeletal:        General: No tenderness or edema.     Cervical back: Normal [name] of motion and neck supple.  Lymphadenopathy:     Cervical: No cervical adenopathy.  Skin:    Findings: No erythema or rash.  Neurological:     Cranial Nerves: No cranial nerve deficit.     Motor: No abnormal muscle tone.     Coordination: Coordination normal.     Deep Tendon Reflexes: Reflexes normal.  Psychiatric:        Mood and Affect: Mood and affect normal.        Behavior: Behavior normal.        Thought Content: Thought content normal.        Judgment: Judgment normal.     Lab Results  Component Value Date   WBC 4.9 02/17/2020   HGB 13.3 02/17/2020   HCT 40.2 02/17/2020   PLT 201 02/17/2020   GLUCOSE 85 02/17/2020   CHOL 208 (H) 01/11/2020   TRIG 81 01/11/2020   HDL 75 01/11/2020   LDLCALC 115 (H) 01/11/2020   ALT 19 01/11/2020   AST 21 01/11/2020   NA 141 02/17/2020   K 4.5 02/17/2020   CL 105 02/17/2020   CREATININE 0.88 02/17/2020   BUN 19 02/17/2020   CO2 29 02/17/2020   TSH 2.14 01/11/2020    CT Head Wo Contrast  Result Date: 08/10/2019 CLINICAL DATA:  Complicated headache syndrome.  Neck pain. EXAM: CT HEAD WITHOUT CONTRAST CT CERVICAL SPINE WITHOUT CONTRAST TECHNIQUE: Multidetector CT imaging of the head and cervical spine was performed following the standard  protocol without intravenous contrast. Multiplanar CT image reconstructions of the cervical spine were also generated. COMPARISON:  None. FINDINGS: CT HEAD FINDINGS Brain: No evidence of acute infarction, hemorrhage, hydrocephalus, extra-axial collection or mass lesion/mass effect. Vascular: Negative for hyperdense vessel Skull: Negative Sinuses/Orbits:  Negative Other: None CT CERVICAL SPINE FINDINGS Alignment: 3 mm anterolisthesis C3-4. Mild anterolisthesis C2-3 and C7-T1 Skull base and vertebrae: Negative for fracture or mass. Soft tissues and spinal canal: Negative for mass or soft tissue swelling. Disc levels: Multilevel disc and facet degeneration in the cervical spine. Mild left foraminal narrowing C3-4 due to spurring. Marked left foraminal narrowing C4-5 due to prominent uncinate spurring and facet hypertrophy. Associated spinal stenosis. Moderate spinal stenosis and moderate left foraminal stenosis at C5-6. Mild foraminal narrowing bilaterally C6-7. Bilateral facet degeneration at C7-T1. Upper chest: Apical scarring bilaterally Other: None IMPRESSION: Negative CT head Cervical spondylosis with spinal and foraminal stenosis as above. No acute cervical spine abnormality. Electronically Signed   By: Franchot Gallo M.D.   On: 08/10/2019 09:06   CT CERVICAL SPINE WO CONTRAST  Result Date: 08/10/2019 CLINICAL DATA:  Complicated headache syndrome.  Neck pain. EXAM: CT HEAD WITHOUT CONTRAST CT CERVICAL SPINE WITHOUT CONTRAST TECHNIQUE: Multidetector CT imaging of the head and cervical spine was performed following the standard protocol without intravenous contrast. Multiplanar CT image reconstructions of the cervical spine were also generated. COMPARISON:  None. FINDINGS: CT HEAD FINDINGS Brain: No evidence of acute infarction, hemorrhage, hydrocephalus, extra-axial collection or mass lesion/mass effect. Vascular: Negative for hyperdense vessel Skull: Negative Sinuses/Orbits: Negative Other: None CT CERVICAL  SPINE FINDINGS Alignment: 3 mm anterolisthesis C3-4. Mild anterolisthesis C2-3 and C7-T1 Skull base and vertebrae: Negative for fracture or mass. Soft tissues and spinal canal: Negative for mass or soft tissue swelling. Disc levels: Multilevel disc and facet degeneration in the cervical spine. Mild left foraminal narrowing C3-4 due to spurring. Marked left foraminal narrowing C4-5 due to prominent uncinate spurring and facet hypertrophy. Associated spinal stenosis. Moderate spinal stenosis and moderate left foraminal stenosis at C5-6. Mild foraminal narrowing bilaterally C6-7. Bilateral facet degeneration at C7-T1. Upper chest: Apical scarring bilaterally Other: None IMPRESSION: Negative CT head Cervical spondylosis with spinal and foraminal stenosis as above. No acute cervical spine abnormality. Electronically Signed   By: Franchot Gallo M.D.   On: 08/10/2019 09:06    Assessment & Plan:    [name], MD

## 2020-05-19 NOTE — Patient Instructions (Addendum)
Sign up for [employer] [facility] ( via Libby app on your phone or your ipad). If you don't have a library card  - go to any library branch. They will set you up in 15 minutes. It is free. You can check out books to read and to listen, check out magazines and newspapers, movies etc.   

## 2020-05-20 DIAGNOSIS — J301 Allergic rhinitis due to pollen: Secondary | ICD-10-CM | POA: Diagnosis not present

## 2020-05-20 DIAGNOSIS — J3089 Other allergic rhinitis: Secondary | ICD-10-CM | POA: Diagnosis not present

## 2020-05-21 ENCOUNTER — Ambulatory Visit: Payer: Medicare Other

## 2020-05-25 DIAGNOSIS — J3089 Other allergic rhinitis: Secondary | ICD-10-CM | POA: Diagnosis not present

## 2020-05-30 ENCOUNTER — Encounter: Payer: Self-pay | Admitting: Internal Medicine

## 2020-05-30 DIAGNOSIS — F439 Reaction to severe stress, unspecified: Secondary | ICD-10-CM | POA: Insufficient documentation

## 2020-05-30 DIAGNOSIS — J301 Allergic rhinitis due to pollen: Secondary | ICD-10-CM | POA: Diagnosis not present

## 2020-05-30 DIAGNOSIS — J3089 Other allergic rhinitis: Secondary | ICD-10-CM | POA: Diagnosis not present

## 2020-05-30 NOTE — Assessment & Plan Note (Addendum)
Palpitations discussed - PSVT.  Continue with diltiazem CD Alprazolam as needed anxiety

## 2020-05-30 NOTE — Assessment & Plan Note (Signed)
Situational -COVID-19 epidemics related. Stress reduction discussed.

## 2020-05-31 ENCOUNTER — Other Ambulatory Visit: Payer: Self-pay | Admitting: Internal Medicine

## 2020-06-02 DIAGNOSIS — H6123 Impacted cerumen, bilateral: Secondary | ICD-10-CM | POA: Diagnosis not present

## 2020-06-06 DIAGNOSIS — J301 Allergic rhinitis due to pollen: Secondary | ICD-10-CM | POA: Diagnosis not present

## 2020-06-06 DIAGNOSIS — J3089 Other allergic rhinitis: Secondary | ICD-10-CM | POA: Diagnosis not present

## 2020-06-08 DIAGNOSIS — H2513 Age-related nuclear cataract, bilateral: Secondary | ICD-10-CM | POA: Diagnosis not present

## 2020-06-08 DIAGNOSIS — H35361 Drusen (degenerative) of macula, right eye: Secondary | ICD-10-CM | POA: Diagnosis not present

## 2020-06-08 DIAGNOSIS — G43B0 Ophthalmoplegic migraine, not intractable: Secondary | ICD-10-CM | POA: Diagnosis not present

## 2020-06-08 DIAGNOSIS — H43812 Vitreous degeneration, left eye: Secondary | ICD-10-CM | POA: Diagnosis not present

## 2020-06-08 DIAGNOSIS — H43393 Other vitreous opacities, bilateral: Secondary | ICD-10-CM | POA: Diagnosis not present

## 2020-06-09 ENCOUNTER — Other Ambulatory Visit: Payer: Self-pay | Admitting: Internal Medicine

## 2020-06-14 DIAGNOSIS — J3089 Other allergic rhinitis: Secondary | ICD-10-CM | POA: Diagnosis not present

## 2020-06-14 DIAGNOSIS — J301 Allergic rhinitis due to pollen: Secondary | ICD-10-CM | POA: Diagnosis not present

## 2020-06-19 ENCOUNTER — Other Ambulatory Visit: Payer: Self-pay | Admitting: Internal Medicine

## 2020-06-20 ENCOUNTER — Other Ambulatory Visit: Payer: Self-pay | Admitting: Internal Medicine

## 2020-06-21 ENCOUNTER — Other Ambulatory Visit: Payer: Self-pay | Admitting: Internal Medicine

## 2020-06-21 ENCOUNTER — Other Ambulatory Visit: Payer: Self-pay | Admitting: *Deleted

## 2020-06-21 MED ORDER — MOMETASONE FUROATE 50 MCG/ACT NA SUSP: 2.0000 | Freq: Every day | NASAL | 5 refills | Status: DC

## 2020-06-21 NOTE — Telephone Encounter (Signed)
Rec'd fax stating pt is requesting refill on the Mometasone nasale spray. We did get the rx for Flunisolide but pt prefers mometasone..[facility]

## 2020-06-22 ENCOUNTER — Ambulatory Visit: Payer: Medicare Other | Admitting: Internal Medicine

## 2020-06-23 ENCOUNTER — Ambulatory Visit: Payer: Medicare Other | Admitting: Internal Medicine

## 2020-06-24 DIAGNOSIS — J3089 Other allergic rhinitis: Secondary | ICD-10-CM | POA: Diagnosis not present

## 2020-06-24 DIAGNOSIS — J301 Allergic rhinitis due to pollen: Secondary | ICD-10-CM | POA: Diagnosis not present

## 2020-06-25 ENCOUNTER — Ambulatory Visit: Payer: Medicare Other

## 2020-06-27 DIAGNOSIS — J3089 Other allergic rhinitis: Secondary | ICD-10-CM | POA: Diagnosis not present

## 2020-06-27 DIAGNOSIS — J301 Allergic rhinitis due to pollen: Secondary | ICD-10-CM | POA: Diagnosis not present

## 2020-06-29 DIAGNOSIS — M79644 Pain in right finger(s): Secondary | ICD-10-CM | POA: Diagnosis not present

## 2020-06-29 DIAGNOSIS — L03011 Cellulitis of right finger: Secondary | ICD-10-CM | POA: Diagnosis not present

## 2020-06-29 DIAGNOSIS — J3081 Allergic rhinitis due to animal (cat) (dog) hair and dander: Secondary | ICD-10-CM | POA: Insufficient documentation

## 2020-06-29 DIAGNOSIS — J301 Allergic rhinitis due to pollen: Secondary | ICD-10-CM | POA: Insufficient documentation

## 2020-07-05 DIAGNOSIS — J301 Allergic rhinitis due to pollen: Secondary | ICD-10-CM | POA: Diagnosis not present

## 2020-07-05 DIAGNOSIS — J3089 Other allergic rhinitis: Secondary | ICD-10-CM | POA: Diagnosis not present

## 2020-07-07 DIAGNOSIS — L03011 Cellulitis of right finger: Secondary | ICD-10-CM | POA: Diagnosis not present

## 2020-07-07 DIAGNOSIS — M79644 Pain in right finger(s): Secondary | ICD-10-CM | POA: Diagnosis not present

## 2020-07-08 ENCOUNTER — Other Ambulatory Visit: Payer: Self-pay

## 2020-07-11 ENCOUNTER — Ambulatory Visit (INDEPENDENT_AMBULATORY_CARE_PROVIDER_SITE_OTHER): Payer: Medicare Other | Admitting: Internal Medicine

## 2020-07-11 ENCOUNTER — Other Ambulatory Visit: Payer: Self-pay

## 2020-07-11 ENCOUNTER — Other Ambulatory Visit: Payer: Self-pay | Admitting: Internal Medicine

## 2020-07-11 ENCOUNTER — Encounter: Payer: Self-pay | Admitting: Internal Medicine

## 2020-07-11 DIAGNOSIS — B009 Herpesviral infection, unspecified: Secondary | ICD-10-CM | POA: Diagnosis not present

## 2020-07-11 DIAGNOSIS — J3089 Other allergic rhinitis: Secondary | ICD-10-CM | POA: Diagnosis not present

## 2020-07-11 DIAGNOSIS — E538 Deficiency of other specified B group vitamins: Secondary | ICD-10-CM | POA: Diagnosis not present

## 2020-07-11 DIAGNOSIS — F411 Generalized anxiety disorder: Secondary | ICD-10-CM

## 2020-07-11 DIAGNOSIS — J301 Allergic rhinitis due to pollen: Secondary | ICD-10-CM | POA: Diagnosis not present

## 2020-07-11 MED ORDER — MOMETASONE FUROATE 50 MCG/ACT NA SUSP
NASAL | 3 refills | Status: DC
Start: 2020-07-11 — End: 2022-01-29

## 2020-07-11 MED ORDER — DEXLANSOPRAZOLE 60 MG PO CPDR: 60.0000 mg | DELAYED_RELEASE_CAPSULE | Freq: Every day | ORAL | 3 refills | Status: DC

## 2020-07-11 MED ORDER — VALACYCLOVIR HCL 500 MG PO TABS: 500.0000 mg | ORAL_TABLET | Freq: Two times a day (BID) | ORAL | 2 refills | Status: DC

## 2020-07-11 MED ORDER — DEXLANSOPRAZOLE 60 MG PO CPDR
60.0000 mg | DELAYED_RELEASE_CAPSULE | Freq: Every day | ORAL | 3 refills | Status: DC
Start: 2020-07-11 — End: 2020-07-11

## 2020-07-11 MED ORDER — MOMETASONE FUROATE 50 MCG/ACT NA SUSP: NASAL | 11 refills | Status: DC

## 2020-07-11 NOTE — Patient Instructions (Signed)
For a mild COVID-19 case you can take zinc 50 mg a day for 1 week, vitamin C 1000 mg daily for 1 week, vitamin D2 50,000 units weekly for 2 months (unless you are taking vitamin D daily already), Quercetin 500 mg twice a day for 1 week (if you can get it quick enough).  Maintain good oral hydration and take Tylenol with high fever. 

## 2020-07-11 NOTE — Assessment & Plan Note (Signed)
Xanax prn  Potential benefits of a long term opioids use as well as potential risks (i.e. addiction risk, apnea etc) and complications (i.e. Somnolence, constipation and others) were explained to the patient and were aknowledged.

## 2020-07-11 NOTE — Progress Notes (Signed)
Subjective:  Patient ID: [name], female    DOB: 27-Sep-1950  Age: 70 y.o. MRN: [id number]  CC: Medication Refill   HPI [name] presents for palpitations, rhinitis  Outpatient Medications Prior to Visit  Medication Sig Dispense Refill  . cefdinir (OMNICEF) 300 MG capsule 1 capsule    . diltiazem (TIAZAC) 180 MG 24 hr capsule     . famotidine (PEPCID) 40 MG tablet     . dexlansoprazole (DEXILANT) 60 MG capsule 1 capsule    . ALPRAZolam (XANAX) 0.25 MG tablet 0.5-1 po bid prn anxiety 30 tablet 1  . Botulinum Toxin Type A, Cosm, (BOTOX COSMETIC) 50 UNITS SOLR Inject 4-6 Units into the muscle. 3 month  injection    . Cholecalciferol (VITAMIN D3 PO) Take 50 mcg by mouth daily.    [name] conjugated estrogens (PREMARIN) vaginal cream Use PV once every 2-3 days 90 g 2  . Cyanocobalamin (VITAMIN B-12) 1000 MCG SUBL PLACE 1 TABLET (1,000 MCG TOTAL) UNDER THE TONGUE DAILY. 100 tablet 3  . dexlansoprazole (DEXILANT) 60 MG capsule Take 1 capsule (60 mg total) by mouth daily. 90 capsule 3  . diltiazem (CARDIZEM CD) 180 MG 24 hr capsule Take 1 capsule (180 mg total) by mouth daily. 30 capsule 3  . diltiazem (CARDIZEM) 30 MG tablet Take 1 tablet (30 mg total) by mouth daily as needed. 30 tablet 1  . EPIPEN 2-PAK 0.3 MG/0.3ML SOAJ injection See admin instructions. Reported on 10/08/2015  1  . flecainide (TAMBOCOR) 50 MG tablet Take 50 mg by mouth as needed (take 1 tablet as needed for fast heart rate).    . flunisolide (NASALIDE) 25 MCG/ACT (0.025%) SOLN PLACE 1 SPRAY INTO THE NOSE 2 (TWO) TIMES DAILY. (Patient not taking: Reported on 07/11/2020) 75 mL 3  . flunisolide (NASALIDE) 25 MCG/ACT (0.025%) SOLN Place 1 spray into the nose 2 (two) times daily. (Patient not taking: Reported on 07/11/2020) 25 mL 3  . flunisolide (NASALIDE) 25 MCG/ACT (0.025%) SOLN Place 1 spray into the nose 2 (two) times daily. (Patient not taking: Reported on 07/11/2020) 25 mL 3  . fluocinonide cream (LIDEX) 6.38 %  Apply 1 application topically 2 (two) times daily. 90 g 2  . linaclotide (LINZESS) 72 MCG capsule Take 1 capsule (72 mcg total) by mouth daily before breakfast. 90 capsule 3  . loratadine (CLARITIN) 10 MG tablet Take 1 tablet (10 mg total) by mouth daily. 90 tablet 3  . mometasone (NASONEX) 50 MCG/ACT nasal spray 2 sprays in each nostril    . tretinoin (RETIN-A) 0.05 % cream Apply 1 application topically at bedtime.    . triamcinolone ointment (KENALOG) 0.5 % Apply 1 application topically 2 (two) times daily. 30 g 0  . UNABLE TO FIND once a week. Med Name: allergy shots    . urea (CARMOL) 40 % CREA 1 APPLICATION AS NEEDED ONCE A DAY EXTERNALLY 30 DAYS    . valACYclovir (VALTREX) 500 MG tablet Take 1 tablet (500 mg total) by mouth 2 (two) times daily. 10 tablet 2  . vitamin B-12 (CYANOCOBALAMIN) 100 MCG tablet     . Botulinum Toxin Type A, Cosm, (BOTOX COSMETIC) 100 units SOLR     . conjugated estrogens (PREMARIN) vaginal cream See admin instructions.    . triamcinolone (KENALOG) 0.1 % SMARTSIG:1 Application Topical 2-3 Times Daily     No facility-administered medications prior to visit.    ROS: Review of Systems  Constitutional: Negative for activity change, appetite change, chills, fatigue  and unexpected weight change.  HENT: Negative for congestion, mouth sores and sinus pressure.   Eyes: Negative for visual disturbance.  Respiratory: Negative for cough and chest tightness.   Gastrointestinal: Negative for abdominal pain and nausea.  Genitourinary: Negative for difficulty urinating, frequency and vaginal pain.  Musculoskeletal: Negative for back pain and gait problem.  Skin: Negative for pallor and rash.  Neurological: Negative for dizziness, tremors, weakness, numbness and headaches.  Psychiatric/Behavioral: Negative for confusion and sleep disturbance. The patient is nervous/anxious.     Objective:  BP (!) 144/80 (BP Location: Left Arm, Patient Position: Sitting, Cuff Size:  Normal)   Pulse 91   Temp 99.1 F (37.3 C) (Oral)   Ht 5\' 2"  (1.575 m)   Wt 103 lb (46.7 kg)   SpO2 97%   BMI 18.84 kg/m   BP Readings from Last 3 Encounters:  07/11/20 (!) 144/80  05/19/20 122/82  05/16/20 138/70    Wt Readings from Last 3 Encounters:  07/11/20 103 lb (46.7 kg)  05/19/20 105 lb 9.6 oz (47.9 kg)  05/16/20 106 lb (48.1 kg)    Physical Exam Constitutional:      General: She is not in acute distress.    Appearance: She is well-developed.  HENT:     Head: Normocephalic.     Right Ear: External ear normal.     Left Ear: External ear normal.     Nose: Nose normal.     Mouth/Throat:     Mouth: Oropharynx is clear and moist.  Eyes:     General:        Right eye: No discharge.        Left eye: No discharge.     Conjunctiva/sclera: Conjunctivae normal.     Pupils: Pupils are equal, round, and reactive to light.  Neck:     Thyroid: No thyromegaly.     Vascular: No JVD.     Trachea: No tracheal deviation.  Cardiovascular:     Rate and Rhythm: Normal rate and regular rhythm.     Heart sounds: Normal heart sounds.  Pulmonary:     Effort: No respiratory distress.     Breath sounds: No stridor. No wheezing.  Abdominal:     General: Bowel sounds are normal. There is no distension.     Palpations: Abdomen is soft. There is no mass.     Tenderness: There is no abdominal tenderness. There is no guarding or rebound.  Musculoskeletal:        General: No tenderness or edema.     Cervical back: Normal [name] of motion and neck supple.  Lymphadenopathy:     Cervical: No cervical adenopathy.  Skin:    Findings: No erythema or rash.  Neurological:     Cranial Nerves: No cranial nerve deficit.     Motor: No abnormal muscle tone.     Coordination: Coordination normal.     Deep Tendon Reflexes: Reflexes normal.  Psychiatric:        Mood and Affect: Mood and affect normal.        Behavior: Behavior normal.        Thought Content: Thought content normal.         Judgment: Judgment normal.     Lab Results  Component Value Date   WBC 4.9 02/17/2020   HGB 13.3 02/17/2020   HCT 40.2 02/17/2020   PLT 201 02/17/2020   GLUCOSE 85 02/17/2020   CHOL 208 (H) 01/11/2020   TRIG 81 01/11/2020  HDL 75 01/11/2020   LDLCALC 115 (H) 01/11/2020   ALT 19 01/11/2020   AST 21 01/11/2020   NA 141 02/17/2020   K 4.5 02/17/2020   CL 105 02/17/2020   CREATININE 0.88 02/17/2020   BUN 19 02/17/2020   CO2 29 02/17/2020   TSH 2.14 01/11/2020    CT Head Wo Contrast  Result Date: 08/10/2019 CLINICAL DATA:  Complicated headache syndrome.  Neck pain. EXAM: CT HEAD WITHOUT CONTRAST CT CERVICAL SPINE WITHOUT CONTRAST TECHNIQUE: Multidetector CT imaging of the head and cervical spine was performed following the standard protocol without intravenous contrast. Multiplanar CT image reconstructions of the cervical spine were also generated. COMPARISON:  None. FINDINGS: CT HEAD FINDINGS Brain: No evidence of acute infarction, hemorrhage, hydrocephalus, extra-axial collection or mass lesion/mass effect. Vascular: Negative for hyperdense vessel Skull: Negative Sinuses/Orbits: Negative Other: None CT CERVICAL SPINE FINDINGS Alignment: 3 mm anterolisthesis C3-4. Mild anterolisthesis C2-3 and C7-T1 Skull base and vertebrae: Negative for fracture or mass. Soft tissues and spinal canal: Negative for mass or soft tissue swelling. Disc levels: Multilevel disc and facet degeneration in the cervical spine. Mild left foraminal narrowing C3-4 due to spurring. Marked left foraminal narrowing C4-5 due to prominent uncinate spurring and facet hypertrophy. Associated spinal stenosis. Moderate spinal stenosis and moderate left foraminal stenosis at C5-6. Mild foraminal narrowing bilaterally C6-7. Bilateral facet degeneration at C7-T1. Upper chest: Apical scarring bilaterally Other: None IMPRESSION: Negative CT head Cervical spondylosis with spinal and foraminal stenosis as above. No acute cervical spine  abnormality. Electronically Signed   By: Franchot Gallo M.D.   On: 08/10/2019 09:06   CT CERVICAL SPINE WO CONTRAST  Result Date: 08/10/2019 CLINICAL DATA:  Complicated headache syndrome.  Neck pain. EXAM: CT HEAD WITHOUT CONTRAST CT CERVICAL SPINE WITHOUT CONTRAST TECHNIQUE: Multidetector CT imaging of the head and cervical spine was performed following the standard protocol without intravenous contrast. Multiplanar CT image reconstructions of the cervical spine were also generated. COMPARISON:  None. FINDINGS: CT HEAD FINDINGS Brain: No evidence of acute infarction, hemorrhage, hydrocephalus, extra-axial collection or mass lesion/mass effect. Vascular: Negative for hyperdense vessel Skull: Negative Sinuses/Orbits: Negative Other: None CT CERVICAL SPINE FINDINGS Alignment: 3 mm anterolisthesis C3-4. Mild anterolisthesis C2-3 and C7-T1 Skull base and vertebrae: Negative for fracture or mass. Soft tissues and spinal canal: Negative for mass or soft tissue swelling. Disc levels: Multilevel disc and facet degeneration in the cervical spine. Mild left foraminal narrowing C3-4 due to spurring. Marked left foraminal narrowing C4-5 due to prominent uncinate spurring and facet hypertrophy. Associated spinal stenosis. Moderate spinal stenosis and moderate left foraminal stenosis at C5-6. Mild foraminal narrowing bilaterally C6-7. Bilateral facet degeneration at C7-T1. Upper chest: Apical scarring bilaterally Other: None IMPRESSION: Negative CT head Cervical spondylosis with spinal and foraminal stenosis as above. No acute cervical spine abnormality. Electronically Signed   By: Franchot Gallo M.D.   On: 08/10/2019 09:06    Assessment & Plan:   Follow-up: No follow-ups on file.  [name], MD

## 2020-07-11 NOTE — Assessment & Plan Note (Signed)
Valtrex po 

## 2020-07-11 NOTE — Assessment & Plan Note (Signed)
On B12 

## 2020-07-12 ENCOUNTER — Ambulatory Visit: Payer: Medicare Other

## 2020-07-13 ENCOUNTER — Other Ambulatory Visit: Payer: Self-pay | Admitting: Internal Medicine

## 2020-07-13 ENCOUNTER — Telehealth: Payer: Self-pay | Admitting: Internal Medicine

## 2020-07-13 DIAGNOSIS — N952 Postmenopausal atrophic vaginitis: Secondary | ICD-10-CM

## 2020-07-13 NOTE — Telephone Encounter (Signed)
Patient called and was wondering if a PA for conjugated estrogens (PREMARIN) vaginal cream could be put in. She can be reached at [phone]. she said that she does not want an alternative called in.

## 2020-07-15 NOTE — Telephone Encounter (Signed)
Tried to proceed w/PA for premarin cream. Both insurance on file is not pulling up pt. Sent pt msg via mychart need updated insurance to proceed.Marland KitchenJohny Chess

## 2020-07-15 NOTE — Telephone Encounter (Signed)
Patient called , she was unable to upload insurance cards. She did read to scheduler the id card numbers on both Medicare and [employer]BS [place] card, which match numbers in system and picture of cards in Epic

## 2020-07-18 DIAGNOSIS — J301 Allergic rhinitis due to pollen: Secondary | ICD-10-CM | POA: Diagnosis not present

## 2020-07-18 DIAGNOSIS — J3089 Other allergic rhinitis: Secondary | ICD-10-CM | POA: Diagnosis not present

## 2020-07-19 ENCOUNTER — Other Ambulatory Visit: Payer: Medicare Other

## 2020-07-19 ENCOUNTER — Other Ambulatory Visit: Payer: Self-pay | Admitting: Internal Medicine

## 2020-07-19 DIAGNOSIS — T881XXS Other complications following immunization, not elsewhere classified, sequela: Secondary | ICD-10-CM | POA: Diagnosis not present

## 2020-07-20 LAB — SARS-COV-2 ANTIBODY(IGG)SPIKE,SEMI-QUANTITATIVE: SARS COV1 AB(IGG)SPIKE,SEMI QN: 27.15 index — ABNORMAL HIGH (ref <1.00)

## 2020-07-21 ENCOUNTER — Telehealth: Payer: Self-pay | Admitting: Internal Medicine

## 2020-07-21 DIAGNOSIS — L739 Follicular disorder, unspecified: Secondary | ICD-10-CM | POA: Diagnosis not present

## 2020-07-21 DIAGNOSIS — L821 Other seborrheic keratosis: Secondary | ICD-10-CM | POA: Diagnosis not present

## 2020-07-21 NOTE — Telephone Encounter (Signed)
    Patient calling to discuss COVID antibody lab result and COT codes for test

## 2020-07-22 ENCOUNTER — Other Ambulatory Visit: Payer: Self-pay | Admitting: Internal Medicine

## 2020-07-22 ENCOUNTER — Other Ambulatory Visit: Payer: Self-pay

## 2020-07-22 ENCOUNTER — Other Ambulatory Visit: Payer: Medicare Other

## 2020-07-22 DIAGNOSIS — E538 Deficiency of other specified B group vitamins: Secondary | ICD-10-CM

## 2020-07-22 DIAGNOSIS — T881XXS Other complications following immunization, not elsewhere classified, sequela: Secondary | ICD-10-CM

## 2020-07-22 NOTE — Telephone Encounter (Signed)
Lucy,  Please see if the lab can run this test from the available specimen: SARS-CoV-2 Semi-Quantitative Total Antibody, Spike?  The order is in.  I replied to [place] and the [employer].  Thanks

## 2020-07-22 NOTE — Telephone Encounter (Signed)
Patient wondering if she needs to go give blood again for the test or if they can run it off the previous specimen

## 2020-07-22 NOTE — Telephone Encounter (Signed)
Faxed add-on sheet to lab.Marland KitchenJohny Chess

## 2020-07-22 NOTE — Telephone Encounter (Signed)
Notified pt another lab visit will be needed.Marland KitchenJohny [name]

## 2020-07-25 ENCOUNTER — Ambulatory Visit: Payer: Medicare Other | Admitting: Internal Medicine

## 2020-07-25 LAB — SARS-COV-2 SEMI-QUANTITATIVE TOTAL ANTIBODY, SPIKE: SARS COV2 AB, Total Spike Semi QN: >2500 U/mL — ABNORMAL HIGH (ref <0.8)

## 2020-07-25 NOTE — Telephone Encounter (Signed)
Pt was able to have labs done on Friday 07/22/20.Marland KitchenJohny [name]

## 2020-07-29 NOTE — Telephone Encounter (Signed)
Resubmitted PA via cover-my-meds with [employer] w/ Key: [id number]. Waiting on insurance response.Marland KitchenJohny Chess

## 2020-08-01 NOTE — Telephone Encounter (Signed)
Rec'd information of portal " This request has received a Cancelled outcome.  This may mean either your patient does not have active coverage with this plan, this authorization was processed as a duplicate request, or an authorization was not needed for this medication... 2nd time trying to do PA both time pt is not found and PA was cancelled../l;mb

## 2020-08-02 ENCOUNTER — Ambulatory Visit: Payer: Medicare Other

## 2020-08-03 ENCOUNTER — Other Ambulatory Visit: Payer: Self-pay | Admitting: Cardiology

## 2020-08-03 ENCOUNTER — Other Ambulatory Visit: Payer: Self-pay | Admitting: Internal Medicine

## 2020-08-03 DIAGNOSIS — J301 Allergic rhinitis due to pollen: Secondary | ICD-10-CM | POA: Diagnosis not present

## 2020-08-03 DIAGNOSIS — J3089 Other allergic rhinitis: Secondary | ICD-10-CM | POA: Diagnosis not present

## 2020-08-08 ENCOUNTER — Ambulatory Visit: Payer: Medicare Other

## 2020-08-08 DIAGNOSIS — J301 Allergic rhinitis due to pollen: Secondary | ICD-10-CM | POA: Diagnosis not present

## 2020-08-08 DIAGNOSIS — J3089 Other allergic rhinitis: Secondary | ICD-10-CM | POA: Diagnosis not present

## 2020-08-09 ENCOUNTER — Ambulatory Visit: Payer: Medicare Other

## 2020-08-19 DIAGNOSIS — J301 Allergic rhinitis due to pollen: Secondary | ICD-10-CM | POA: Diagnosis not present

## 2020-08-19 DIAGNOSIS — J3089 Other allergic rhinitis: Secondary | ICD-10-CM | POA: Diagnosis not present

## 2020-08-22 ENCOUNTER — Other Ambulatory Visit (HOSPITAL_COMMUNITY): Payer: Medicare Other

## 2020-08-23 ENCOUNTER — Ambulatory Visit (HOSPITAL_COMMUNITY): Payer: Medicare Other | Attending: Cardiovascular Disease

## 2020-08-23 ENCOUNTER — Other Ambulatory Visit: Payer: Self-pay

## 2020-08-23 DIAGNOSIS — I351 Nonrheumatic aortic (valve) insufficiency: Secondary | ICD-10-CM | POA: Insufficient documentation

## 2020-08-23 DIAGNOSIS — I471 Supraventricular tachycardia: Secondary | ICD-10-CM | POA: Insufficient documentation

## 2020-08-23 LAB — ECHOCARDIOGRAM COMPLETE
Area-P 1/2: 3.99 cm2
P 1/2 time: 348 msec
S' Lateral: 2.5 cm

## 2020-08-24 DIAGNOSIS — J3089 Other allergic rhinitis: Secondary | ICD-10-CM | POA: Diagnosis not present

## 2020-08-24 DIAGNOSIS — J301 Allergic rhinitis due to pollen: Secondary | ICD-10-CM | POA: Diagnosis not present

## 2020-09-01 DIAGNOSIS — L570 Actinic keratosis: Secondary | ICD-10-CM | POA: Diagnosis not present

## 2020-09-01 DIAGNOSIS — L309 Dermatitis, unspecified: Secondary | ICD-10-CM | POA: Diagnosis not present

## 2020-09-02 DIAGNOSIS — J3089 Other allergic rhinitis: Secondary | ICD-10-CM | POA: Diagnosis not present

## 2020-09-02 DIAGNOSIS — J301 Allergic rhinitis due to pollen: Secondary | ICD-10-CM | POA: Diagnosis not present

## 2020-09-02 DIAGNOSIS — J3081 Allergic rhinitis due to animal (cat) (dog) hair and dander: Secondary | ICD-10-CM | POA: Diagnosis not present

## 2020-09-09 DIAGNOSIS — J3081 Allergic rhinitis due to animal (cat) (dog) hair and dander: Secondary | ICD-10-CM | POA: Diagnosis not present

## 2020-09-09 DIAGNOSIS — J301 Allergic rhinitis due to pollen: Secondary | ICD-10-CM | POA: Diagnosis not present

## 2020-09-09 DIAGNOSIS — J3089 Other allergic rhinitis: Secondary | ICD-10-CM | POA: Diagnosis not present

## 2020-09-12 ENCOUNTER — Ambulatory Visit (INDEPENDENT_AMBULATORY_CARE_PROVIDER_SITE_OTHER): Payer: Medicare Other | Admitting: Cardiology

## 2020-09-12 ENCOUNTER — Other Ambulatory Visit: Payer: Self-pay

## 2020-09-12 ENCOUNTER — Encounter: Payer: Self-pay | Admitting: Cardiology

## 2020-09-12 VITALS — BP 122/60 | HR 72 | Ht 62.0 in | Wt 104.0 lb

## 2020-09-12 DIAGNOSIS — I471 Supraventricular tachycardia: Secondary | ICD-10-CM | POA: Diagnosis not present

## 2020-09-12 DIAGNOSIS — H5509 Other forms of nystagmus: Secondary | ICD-10-CM | POA: Diagnosis not present

## 2020-09-12 NOTE — Patient Instructions (Signed)
Medication Instructions:  °Your physician recommends that you continue on your current medications as directed. Please refer to the Current Medication list given to you today. ° °*If you need a refill on your cardiac medications before your next appointment, please call your pharmacy* ° ° °Lab Work: °None ordered ° ° °Testing/Procedures: °None ordered ° ° °Follow-Up: °At [facility], you and your health needs are our priority.  As part of our continuing mission to provide you with exceptional heart care, we have created designated Provider Care Teams.  These Care Teams include your primary Cardiologist (physician) and Advanced Practice Providers (APPs -  Physician Assistants and Nurse Practitioners) who all work together to provide you with the care you need, when you need it. ° °Your next appointment:   °1 year(s) ° °The format for your next appointment:   °In Person ° °Provider:   °Will Camnitz, MD ° ° ° °Thank you for choosing [facility]!! ° ° °Rebeca Valdivia, RN °[phone] ° ° ° °

## 2020-09-12 NOTE — Progress Notes (Signed)
Electrophysiology Office Note   Date:  09/12/2020   ID:  [name], DOB October 03, 1950, MRN [id number]  PCP:  Tresa Garter, MD  Cardiologist:  Clifton James Primary Electrophysiologist:  Malakye Nolden Jorja Loa, MD    Chief Complaint: SVT   History of Present Illness: [name] is a 70 y.o. female who is being seen today for the evaluation of SVT at the request of Plotnikov, Georgina Quint, MD. Presenting today for electrophysiology evaluation.  She has a history of SVT, PACs, vasovagal syncope, aortic insufficiency, mitral regurgitation, IBS, and GERD.  She has been having palpitations for many years.  Cardiac monitor April 2020 showed short runs of SVT.  She began having more frequent episodes and her diltiazem was increased at the last visit.  She feels she has palpitations.  Today, denies symptoms of palpitations, chest pain, shortness of breath, orthopnea, PND, lower extremity edema, claudication, dizziness, presyncope, syncope, bleeding, or neurologic sequela. The patient is tolerating medications without difficulties.  Since last being seen she has done well.  Her palpitations have improved and she has had less episodes.  She feels that she is having quite a bit less stress now which is improved her symptoms.  She is overall comfortable with her control.   Past Medical History:  Diagnosis Date  . Allergic rhinitis, cause unspecified   . Backache, unspecified   . Benign fasciculation-cramp syndrome 12/03/2012    Worsening with  stress, fatigue .  [name] Calculus of kidney   . Conversion disorder   . Cotton wool spots   . Esophageal reflux   . Floater, vitreous, left 05/2016  . GERD (gastroesophageal reflux disease)   . Hemorrhage of rectum and anus   . Internal hemorrhoids without mention of complication   . Irritable bowel syndrome   . Microscopic hematuria   . Migraine headache with aura   . Mild aortic insufficiency   . Mild tricuspid regurgitation   . Osteopenia    . Other plastic surgery for unacceptable cosmetic appearance    Inj tx fllers/expander  . Personal history of urinary calculi   . Postmenopausal atrophic vaginitis   . Premature atrial contractions   . PVC (premature ventricular contraction)    a. Event monitor 2013.  [name] Unspecified constipation   . Vasovagal syncope    Past Surgical History:  Procedure Laterality Date  . BREAST BIOPSY Right   . Cosmetic Procedures w/Injection therapy    . SKIN CANCER EXCISION Left    thigh  . WISDOM TOOTH EXTRACTION       Current Outpatient Medications  Medication Sig Dispense Refill  . ALPRAZolam (XANAX) 0.25 MG tablet 0.5-1 po bid prn anxiety 30 tablet 1  . Botulinum Toxin Type A, Cosm, 50 units SOLR Inject 4-6 Units into the muscle. 3 month  injection    . cefdinir (OMNICEF) 300 MG capsule 1 capsule    . Cholecalciferol (VITAMIN D3 PO) Take 50 mcg by mouth daily.    . Cyanocobalamin (VITAMIN B-12) 1000 MCG SUBL PLACE 1 TABLET (1,000 MCG TOTAL) UNDER THE TONGUE DAILY. 100 tablet 3  . DEXILANT 60 MG capsule TAKE 1 CAPSULE BY MOUTH EVERY DAY 90 capsule 3  . diltiazem (CARDIZEM CD) 180 MG 24 hr capsule Take 1 capsule (180 mg total) by mouth daily. 30 capsule 3  . diltiazem (CARDIZEM) 30 MG tablet Take 1 tablet (30 mg total) by mouth daily as needed. 30 tablet 1  . diltiazem (TIAZAC) 180 MG 24 hr capsule     .  EPIPEN 2-PAK 0.3 MG/0.3ML SOAJ injection See admin instructions. Reported on 10/08/2015  1  . estradiol (ESTRACE) 0.1 MG/GM vaginal cream As directed vaginally 42.5 g 3  . famotidine (PEPCID) 40 MG tablet     . flecainide (TAMBOCOR) 50 MG tablet Take 1 tablet (50 mg total) by mouth daily as needed. For fast heart rate. 90 tablet 3  . fluocinonide cream (LIDEX) 0.05 % Apply 1 application topically 2 (two) times daily. 90 g 2  . linaclotide (LINZESS) 72 MCG capsule Take 1 capsule (72 mcg total) by mouth daily before breakfast. 90 capsule 3  . loratadine (CLARITIN) 10 MG tablet Take 1 tablet  (10 mg total) by mouth daily. 90 tablet 3  . mometasone (NASONEX) 50 MCG/ACT nasal spray 2 sprays in each nostril 3 each 3  . tretinoin (RETIN-A) 0.05 % cream Apply 1 application topically at bedtime.    . triamcinolone ointment (KENALOG) 0.5 % Apply 1 application topically 2 (two) times daily. 30 g 0  . UNABLE TO FIND once a week. Med Name: allergy shots    . urea (CARMOL) 40 % CREA 1 APPLICATION AS NEEDED ONCE A DAY EXTERNALLY 30 DAYS    . valACYclovir (VALTREX) 500 MG tablet Take 1 tablet (500 mg total) by mouth 2 (two) times daily. 10 tablet 2  . vitamin B-12 (CYANOCOBALAMIN) 100 MCG tablet      No current facility-administered medications for this visit.    Allergies:   Codeine, Doxycycline, Epinephrine, Neomycin, Penicillins, Protonix [pantoprazole sodium], Sulfonamide derivatives, and Zithromax [azithromycin]   Social History:  The patient  reports that she has never smoked. She has never used smokeless tobacco. She reports that she does not drink alcohol and does not use drugs.   Family History:  The patient's family history includes COPD in her father; Headache in her sister; Hyperlipidemia in her father and mother; Hypertension in her father and mother.   ROS:  Please see the history of present illness.   Otherwise, review of systems is positive for none.   All other systems are reviewed and negative.   PHYSICAL EXAM: VS:  BP 122/60   Pulse 72   Ht 5\' 2"  (1.575 m)   Wt 104 lb (47.2 kg)   BMI 19.02 kg/m  , BMI Body mass index is 19.02 kg/m. GEN: Well nourished, well developed, in no acute distress  HEENT: normal  Neck: no JVD, carotid bruits, or masses Cardiac: RRR; no murmurs, rubs, or gallops,no edema  Respiratory:  clear to auscultation bilaterally, normal work of breathing GI: soft, nontender, nondistended, + BS MS: no deformity or atrophy  Skin: warm and dry Neuro:  Strength and sensation are intact Psych: euthymic mood, full affect  EKG:  EKG is ordered  today. Personal review of the ekg ordered shows sinus rhythm, rate 72  Recent Labs: 01/11/2020: ALT 19; TSH 2.14 02/17/2020: BUN 19; Creat 0.88; Hemoglobin 13.3; Platelets 201; Potassium 4.5; Sodium 141    Lipid Panel     Component Value Date/Time   CHOL 208 (H) 01/11/2020 0800   TRIG 81 01/11/2020 0800   TRIG 66 06/25/2006 0741   HDL 75 01/11/2020 0800   CHOLHDL 2.8 01/11/2020 0800   VLDL 22.6 01/13/2019 0929   LDLCALC 115 (H) 01/11/2020 0800     Wt Readings from Last 3 Encounters:  09/12/20 104 lb (47.2 kg)  07/11/20 103 lb (46.7 kg)  05/19/20 105 lb 9.6 oz (47.9 kg)      Other studies Reviewed: Additional  studies/ records that were reviewed today include: TTE 08/25/18  Review of the above records today demonstrates:  1. The left ventricle has normal systolic function, with an ejection  fraction of 55-60%. The cavity size was normal. Left ventricular diastolic  parameters were normal.  2. The right ventricle has normal systolic function. The cavity was  normal. There is no increase in right ventricular wall thickness.  3. The tricuspid valve is grossly normal.  4. The aortic valve is tricuspid Mild thickening of the aortic valve  Aortic valve regurgitation is mild by color flow Doppler.  5. Normal LV function; sclerotic aortic valve with mild AI.   Cardiac monitor 10/27/2018 personally reviewed Sinus rhythm Premature atrial contractions One short run of supraventricular tachycardia  (one minute)  ASSESSMENT AND PLAN:  1.  SVT: Currently on diltiazem and as needed flecainide.  Since last being seen she has had minimal episodes of SVT.  She is comfortable with her overall control.  She has as needed and daily diltiazem, though she has not started the daily medication yet.  We Laszlo Ellerby continue with current management.  2.  Aortic insufficiency: Mild by echo in 2020   Current medicines are reviewed at length with the patient today.   The patient does not have concerns  regarding her medicines.  The following changes were made today: None  Labs/ tests ordered today include:  Orders Placed This Encounter  Procedures  . EKG 12-Lead     Disposition:   FU with Alayshia Marini 12 months  Signed, Mlissa Tamayo Jorja Loa, MD  09/12/2020 10:50 AM     [facility] [address] [place] [address] [phone] (office) [phone] (fax)

## 2020-09-14 ENCOUNTER — Ambulatory Visit: Payer: Medicare Other | Admitting: Family Medicine

## 2020-09-16 DIAGNOSIS — J3081 Allergic rhinitis due to animal (cat) (dog) hair and dander: Secondary | ICD-10-CM | POA: Diagnosis not present

## 2020-09-16 DIAGNOSIS — J3089 Other allergic rhinitis: Secondary | ICD-10-CM | POA: Diagnosis not present

## 2020-09-16 DIAGNOSIS — J301 Allergic rhinitis due to pollen: Secondary | ICD-10-CM | POA: Diagnosis not present

## 2020-09-19 ENCOUNTER — Other Ambulatory Visit: Payer: Self-pay | Admitting: Internal Medicine

## 2020-09-20 DIAGNOSIS — H2513 Age-related nuclear cataract, bilateral: Secondary | ICD-10-CM | POA: Diagnosis not present

## 2020-09-22 DIAGNOSIS — R21 Rash and other nonspecific skin eruption: Secondary | ICD-10-CM | POA: Diagnosis not present

## 2020-09-22 DIAGNOSIS — J3081 Allergic rhinitis due to animal (cat) (dog) hair and dander: Secondary | ICD-10-CM | POA: Diagnosis not present

## 2020-09-22 DIAGNOSIS — J3089 Other allergic rhinitis: Secondary | ICD-10-CM | POA: Diagnosis not present

## 2020-09-22 DIAGNOSIS — J301 Allergic rhinitis due to pollen: Secondary | ICD-10-CM | POA: Diagnosis not present

## 2020-09-28 DIAGNOSIS — H1045 Other chronic allergic conjunctivitis: Secondary | ICD-10-CM | POA: Diagnosis not present

## 2020-09-29 DIAGNOSIS — H11122 Conjunctival concretions, left eye: Secondary | ICD-10-CM | POA: Diagnosis not present

## 2020-09-29 DIAGNOSIS — H16143 Punctate keratitis, bilateral: Secondary | ICD-10-CM | POA: Diagnosis not present

## 2020-09-29 DIAGNOSIS — H1045 Other chronic allergic conjunctivitis: Secondary | ICD-10-CM | POA: Diagnosis not present

## 2020-09-29 DIAGNOSIS — H04123 Dry eye syndrome of bilateral lacrimal glands: Secondary | ICD-10-CM | POA: Diagnosis not present

## 2020-09-29 DIAGNOSIS — H16212 Exposure keratoconjunctivitis, left eye: Secondary | ICD-10-CM | POA: Diagnosis not present

## 2020-09-29 DIAGNOSIS — H11442 Conjunctival cysts, left eye: Secondary | ICD-10-CM | POA: Diagnosis not present

## 2020-10-03 ENCOUNTER — Ambulatory Visit: Payer: Medicare Other | Admitting: Internal Medicine

## 2020-10-04 ENCOUNTER — Ambulatory Visit: Payer: Medicare Other | Admitting: Internal Medicine

## 2020-10-04 DIAGNOSIS — H1045 Other chronic allergic conjunctivitis: Secondary | ICD-10-CM | POA: Diagnosis not present

## 2020-10-04 DIAGNOSIS — H16212 Exposure keratoconjunctivitis, left eye: Secondary | ICD-10-CM | POA: Diagnosis not present

## 2020-10-04 DIAGNOSIS — G43B0 Ophthalmoplegic migraine, not intractable: Secondary | ICD-10-CM | POA: Diagnosis not present

## 2020-10-04 DIAGNOSIS — H35361 Drusen (degenerative) of macula, right eye: Secondary | ICD-10-CM | POA: Diagnosis not present

## 2020-10-04 DIAGNOSIS — H43393 Other vitreous opacities, bilateral: Secondary | ICD-10-CM | POA: Diagnosis not present

## 2020-10-04 DIAGNOSIS — H04123 Dry eye syndrome of bilateral lacrimal glands: Secondary | ICD-10-CM | POA: Diagnosis not present

## 2020-10-04 DIAGNOSIS — H11122 Conjunctival concretions, left eye: Secondary | ICD-10-CM | POA: Diagnosis not present

## 2020-10-04 DIAGNOSIS — H11442 Conjunctival cysts, left eye: Secondary | ICD-10-CM | POA: Diagnosis not present

## 2020-10-04 DIAGNOSIS — H16143 Punctate keratitis, bilateral: Secondary | ICD-10-CM | POA: Diagnosis not present

## 2020-10-04 DIAGNOSIS — H43812 Vitreous degeneration, left eye: Secondary | ICD-10-CM | POA: Diagnosis not present

## 2020-10-04 DIAGNOSIS — H2513 Age-related nuclear cataract, bilateral: Secondary | ICD-10-CM | POA: Diagnosis not present

## 2020-10-05 ENCOUNTER — Ambulatory Visit: Payer: Medicare Other | Admitting: Internal Medicine

## 2020-10-06 ENCOUNTER — Ambulatory Visit (INDEPENDENT_AMBULATORY_CARE_PROVIDER_SITE_OTHER): Payer: Medicare Other | Admitting: Internal Medicine

## 2020-10-06 ENCOUNTER — Encounter: Payer: Self-pay | Admitting: Internal Medicine

## 2020-10-06 ENCOUNTER — Other Ambulatory Visit: Payer: Self-pay

## 2020-10-06 VITALS — BP 118/76 | HR 75 | Temp 98.4°F | Ht 62.0 in | Wt 102.4 lb

## 2020-10-06 DIAGNOSIS — R002 Palpitations: Secondary | ICD-10-CM

## 2020-10-06 DIAGNOSIS — T781XXD Other adverse food reactions, not elsewhere classified, subsequent encounter: Secondary | ICD-10-CM | POA: Diagnosis not present

## 2020-10-06 DIAGNOSIS — T881XXS Other complications following immunization, not elsewhere classified, sequela: Secondary | ICD-10-CM | POA: Diagnosis not present

## 2020-10-06 DIAGNOSIS — E785 Hyperlipidemia, unspecified: Secondary | ICD-10-CM

## 2020-10-06 DIAGNOSIS — F411 Generalized anxiety disorder: Secondary | ICD-10-CM

## 2020-10-06 DIAGNOSIS — J3089 Other allergic rhinitis: Secondary | ICD-10-CM | POA: Diagnosis not present

## 2020-10-06 DIAGNOSIS — R21 Rash and other nonspecific skin eruption: Secondary | ICD-10-CM | POA: Diagnosis not present

## 2020-10-06 DIAGNOSIS — J301 Allergic rhinitis due to pollen: Secondary | ICD-10-CM | POA: Diagnosis not present

## 2020-10-06 NOTE — Progress Notes (Signed)
Subjective:  Patient ID: [name], female    DOB: [birth year]  Age: 70 y.o. MRN: [id number]  CC: Follow-up   HPI Virgene Tirone presents for palpitations, anxiety, reaction to COVID-19 vaccine follow-up.  The patient continues to have anxiety and stress due to COVID-19 pandemics  Outpatient Medications Prior to Visit  Medication Sig Dispense Refill  . ALPRAZolam (XANAX) 0.25 MG tablet 0.5-1 po bid prn anxiety 30 tablet 1  . Botulinum Toxin Type A, Cosm, 50 units SOLR Inject 4-6 Units into the muscle. 3 month  injection    . cefdinir (OMNICEF) 300 MG capsule 1 capsule    . Cholecalciferol (VITAMIN D3 PO) Take 50 mcg by mouth daily.    . Cyanocobalamin (VITAMIN B-12) 1000 MCG SUBL PLACE 1 TABLET (1,000 MCG TOTAL) UNDER THE TONGUE DAILY. 100 tablet 3  . DEXILANT 60 MG capsule TAKE 1 CAPSULE BY MOUTH EVERY DAY 90 capsule 3  . diltiazem (CARDIZEM CD) 180 MG 24 hr capsule Take 1 capsule (180 mg total) by mouth daily. 30 capsule 3  . diltiazem (CARDIZEM) 30 MG tablet Take 1 tablet (30 mg total) by mouth daily as needed. 30 tablet 1  . diltiazem (TIAZAC) 180 MG 24 hr capsule     . EPIPEN 2-PAK 0.3 MG/0.3ML SOAJ injection See admin instructions. Reported on 10/08/2015  1  . EUCRISA 2 % OINT Apply topically. Apply 1 to 2 times a day    . famotidine (PEPCID) 40 MG tablet TAKE 1 TABLET BY MOUTH EVERY DAY 90 tablet 3  . flecainide (TAMBOCOR) 50 MG tablet Take 1 tablet (50 mg total) by mouth daily as needed. For fast heart rate. 90 tablet 3  . linaclotide (LINZESS) 72 MCG capsule Take 1 capsule (72 mcg total) by mouth daily before breakfast. 90 capsule 3  . loratadine (CLARITIN) 10 MG tablet Take 1 tablet (10 mg total) by mouth daily. 90 tablet 3  . mometasone (NASONEX) 50 MCG/ACT nasal spray 2 sprays in each nostril 3 each 3  . tretinoin (RETIN-A) 0.05 % cream Apply 1 application topically at bedtime.    [name] UNABLE TO FIND once a week. Med Name: allergy shots    . valACYclovir (VALTREX)  500 MG tablet Take 1 tablet (500 mg total) by mouth 2 (two) times daily. 10 tablet 2  . estradiol (ESTRACE) 0.1 MG/GM vaginal cream As directed vaginally 42.5 g 3  . fluocinonide cream (LIDEX) 1.51 % Apply 1 application topically 2 (two) times daily. (Patient not taking: Reported on 10/06/2020) 90 g 2  . triamcinolone ointment (KENALOG) 0.5 % Apply 1 application topically 2 (two) times daily. (Patient not taking: Reported on 10/06/2020) 30 g 0  . urea (CARMOL) 40 % CREA 1 APPLICATION AS NEEDED ONCE A DAY EXTERNALLY 30 DAYS (Patient not taking: Reported on 10/06/2020)    . vitamin B-12 (CYANOCOBALAMIN) 100 MCG tablet      No facility-administered medications prior to visit.    ROS: Review of Systems  Constitutional: Negative for activity change, appetite change, chills, fatigue and unexpected weight change.  HENT: Negative for congestion, mouth sores and sinus pressure.   Eyes: Negative for visual disturbance.  Respiratory: Negative for cough and chest tightness.   Gastrointestinal: Negative for abdominal pain and nausea.  Genitourinary: Negative for difficulty urinating, frequency and vaginal pain.  Musculoskeletal: Negative for back pain and gait problem.  Skin: Negative for pallor and rash.  Neurological: Negative for dizziness, tremors, weakness, numbness and headaches.  Psychiatric/Behavioral: Negative for confusion, sleep  disturbance and suicidal ideas. The patient is nervous/anxious.     Objective:  BP 118/76 (BP Location: Left Arm)   Pulse 75   Temp 98.4 F (36.9 C) (Oral)   Ht 5\' 2"  (1.575 m)   Wt 102 lb 6.4 oz (46.4 kg)   SpO2 98%   BMI 18.73 kg/m   BP Readings from Last 3 Encounters:  10/06/20 118/76  09/12/20 122/60  07/11/20 (!) 144/80    Wt Readings from Last 3 Encounters:  10/06/20 102 lb 6.4 oz (46.4 kg)  09/12/20 104 lb (47.2 kg)  07/11/20 103 lb (46.7 kg)    Physical Exam Constitutional:      General: She is not in acute distress.    Appearance: She is  well-developed.  HENT:     Head: Normocephalic.     Right Ear: External ear normal.     Left Ear: External ear normal.     Nose: Nose normal.  Eyes:     General:        Right eye: No discharge.        Left eye: No discharge.     Conjunctiva/sclera: Conjunctivae normal.     Pupils: Pupils are equal, round, and reactive to light.  Neck:     Thyroid: No thyromegaly.     Vascular: No JVD.     Trachea: No tracheal deviation.  Cardiovascular:     Rate and Rhythm: Normal rate and regular rhythm.     Heart sounds: Normal heart sounds.  Pulmonary:     Effort: No respiratory distress.     Breath sounds: No stridor. No wheezing.  Abdominal:     General: Bowel sounds are normal. There is no distension.     Palpations: Abdomen is soft. There is no mass.     Tenderness: There is no abdominal tenderness. There is no guarding or rebound.  Musculoskeletal:        General: No tenderness.     Cervical back: Normal [name] of motion and neck supple.  Lymphadenopathy:     Cervical: No cervical adenopathy.  Skin:    Findings: No erythema or rash.  Neurological:     Mental Status: She is oriented to person, place, and time.     Cranial Nerves: No cranial nerve deficit.     Motor: No abnormal muscle tone.     Coordination: Coordination normal.     Deep Tendon Reflexes: Reflexes normal.  Psychiatric:        Behavior: Behavior normal.        Thought Content: Thought content normal.        Judgment: Judgment normal.     Lab Results  Component Value Date   WBC 4.9 02/17/2020   HGB 13.3 02/17/2020   HCT 40.2 02/17/2020   PLT 201 02/17/2020   GLUCOSE 85 02/17/2020   CHOL 208 (H) 01/11/2020   TRIG 81 01/11/2020   HDL 75 01/11/2020   LDLCALC 115 (H) 01/11/2020   ALT 19 01/11/2020   AST 21 01/11/2020   NA 141 02/17/2020   K 4.5 02/17/2020   CL 105 02/17/2020   CREATININE 0.88 02/17/2020   BUN 19 02/17/2020   CO2 29 02/17/2020   TSH 2.14 01/11/2020    CT Head Wo Contrast  Result  Date: 08/10/2019 CLINICAL DATA:  Complicated headache syndrome.  Neck pain. EXAM: CT HEAD WITHOUT CONTRAST CT CERVICAL SPINE WITHOUT CONTRAST TECHNIQUE: Multidetector CT imaging of the head and cervical spine was performed following the standard  protocol without intravenous contrast. Multiplanar CT image reconstructions of the cervical spine were also generated. COMPARISON:  None. FINDINGS: CT HEAD FINDINGS Brain: No evidence of acute infarction, hemorrhage, hydrocephalus, extra-axial collection or mass lesion/mass effect. Vascular: Negative for hyperdense vessel Skull: Negative Sinuses/Orbits: Negative Other: None CT CERVICAL SPINE FINDINGS Alignment: 3 mm anterolisthesis C3-4. Mild anterolisthesis C2-3 and C7-T1 Skull base and vertebrae: Negative for fracture or mass. Soft tissues and spinal canal: Negative for mass or soft tissue swelling. Disc levels: Multilevel disc and facet degeneration in the cervical spine. Mild left foraminal narrowing C3-4 due to spurring. Marked left foraminal narrowing C4-5 due to prominent uncinate spurring and facet hypertrophy. Associated spinal stenosis. Moderate spinal stenosis and moderate left foraminal stenosis at C5-6. Mild foraminal narrowing bilaterally C6-7. Bilateral facet degeneration at C7-T1. Upper chest: Apical scarring bilaterally Other: None IMPRESSION: Negative CT head Cervical spondylosis with spinal and foraminal stenosis as above. No acute cervical spine abnormality. Electronically Signed   By: Franchot Gallo M.D.   On: 08/10/2019 09:06   CT CERVICAL SPINE WO CONTRAST  Result Date: 08/10/2019 CLINICAL DATA:  Complicated headache syndrome.  Neck pain. EXAM: CT HEAD WITHOUT CONTRAST CT CERVICAL SPINE WITHOUT CONTRAST TECHNIQUE: Multidetector CT imaging of the head and cervical spine was performed following the standard protocol without intravenous contrast. Multiplanar CT image reconstructions of the cervical spine were also generated. COMPARISON:  None.  FINDINGS: CT HEAD FINDINGS Brain: No evidence of acute infarction, hemorrhage, hydrocephalus, extra-axial collection or mass lesion/mass effect. Vascular: Negative for hyperdense vessel Skull: Negative Sinuses/Orbits: Negative Other: None CT CERVICAL SPINE FINDINGS Alignment: 3 mm anterolisthesis C3-4. Mild anterolisthesis C2-3 and C7-T1 Skull base and vertebrae: Negative for fracture or mass. Soft tissues and spinal canal: Negative for mass or soft tissue swelling. Disc levels: Multilevel disc and facet degeneration in the cervical spine. Mild left foraminal narrowing C3-4 due to spurring. Marked left foraminal narrowing C4-5 due to prominent uncinate spurring and facet hypertrophy. Associated spinal stenosis. Moderate spinal stenosis and moderate left foraminal stenosis at C5-6. Mild foraminal narrowing bilaterally C6-7. Bilateral facet degeneration at C7-T1. Upper chest: Apical scarring bilaterally Other: None IMPRESSION: Negative CT head Cervical spondylosis with spinal and foraminal stenosis as above. No acute cervical spine abnormality. Electronically Signed   By: Franchot Gallo M.D.   On: 08/10/2019 09:06    Assessment & Plan:   There are no diagnoses linked to this encounter.   No orders of the defined types were placed in this encounter.    Follow-up: No follow-ups on file.  [name], MD

## 2020-10-08 DIAGNOSIS — J3089 Other allergic rhinitis: Secondary | ICD-10-CM | POA: Diagnosis not present

## 2020-10-10 ENCOUNTER — Ambulatory Visit: Payer: Medicare Other | Admitting: Internal Medicine

## 2020-10-14 DIAGNOSIS — J3089 Other allergic rhinitis: Secondary | ICD-10-CM | POA: Diagnosis not present

## 2020-10-18 ENCOUNTER — Other Ambulatory Visit: Payer: Self-pay

## 2020-10-18 ENCOUNTER — Ambulatory Visit (INDEPENDENT_AMBULATORY_CARE_PROVIDER_SITE_OTHER): Payer: Medicare Other | Admitting: Internal Medicine

## 2020-10-18 ENCOUNTER — Encounter: Payer: Self-pay | Admitting: Internal Medicine

## 2020-10-18 DIAGNOSIS — K219 Gastro-esophageal reflux disease without esophagitis: Secondary | ICD-10-CM

## 2020-10-18 DIAGNOSIS — E785 Hyperlipidemia, unspecified: Secondary | ICD-10-CM

## 2020-10-18 DIAGNOSIS — R0789 Other chest pain: Secondary | ICD-10-CM

## 2020-10-18 MED ORDER — PAXLOVID 20 X 150 MG & 10 X 100MG PO TBPK: 3.0000 | ORAL_TABLET | Freq: Two times a day (BID) | ORAL | 0 refills | Status: DC

## 2020-10-18 NOTE — Assessment & Plan Note (Addendum)
Take Mylanta 4 times a day Coronary calcium score CT ordered EKG - OK

## 2020-10-18 NOTE — Assessment & Plan Note (Addendum)
Worse  Dexilant. Famotidine prn Take Mylanta 4 times a day

## 2020-10-18 NOTE — Patient Instructions (Addendum)
Take Mylanta 4 times a day   Cardiac CT calcium scoring test $99 Tel # is [phone]   Computed tomography, more commonly known as a CT or CAT scan, is a diagnostic medical imaging test. Like traditional x-rays, it produces multiple images or pictures of the inside of the body. The cross-sectional images generated during a CT scan can be reformatted in multiple planes. They can even generate three-dimensional images. These images can be viewed on a computer monitor, printed on film or by a 3D printer, or transferred to a CD or DVD. CT images of internal organs, bones, soft tissue and blood vessels provide greater detail than traditional x-rays, particularly of soft tissues and blood vessels. A cardiac CT scan for coronary calcium is a non-invasive way of obtaining information about the presence, location and extent of calcified plaque in the coronary arteries--the vessels that supply oxygen-containing blood to the heart muscle. Calcified plaque results when there is a build-up of fat and other substances under the inner layer of the artery. This material can calcify which signals the presence of atherosclerosis, a disease of the vessel wall, also called coronary artery disease (CAD). People with this disease have an increased risk for heart attacks. In addition, over time, progression of plaque build up (CAD) can narrow the arteries or even close off blood flow to the heart. The result may be chest pain, sometimes called "angina," or a heart attack. Because calcium is a marker of CAD, the amount of calcium detected on a cardiac CT scan is a helpful prognostic tool. The findings on cardiac CT are expressed as a calcium score. Another name for this test is coronary artery calcium scoring.  What are some common uses of the procedure? The goal of cardiac CT scan for calcium scoring is to determine if CAD is present and to what extent, even if there are no symptoms. It is a screening study that may be  recommended by a physician for patients with risk factors for CAD but no clinical symptoms. The major risk factors for CAD are: . high blood cholesterol levels  . family history of heart attacks  . diabetes  . high blood pressure  . cigarette smoking  . overweight or obese  . physical inactivity   A negative cardiac CT scan for calcium scoring shows no calcification within the coronary arteries. This suggests that CAD is absent or so minimal it cannot be seen by this technique. The chance of having a heart attack over the next two to five years is very low under these circumstances. A positive test means that CAD is present, regardless of whether or not the patient is experiencing any symptoms. The amount of calcification--expressed as the calcium score--may help to predict the likelihood of a myocardial infarction (heart attack) in the coming years and helps your medical doctor or cardiologist decide whether the patient may need to take preventive medicine or undertake other measures such as diet and exercise to lower the risk for heart attack. The extent of CAD is graded according to your calcium score:  Calcium Score  Presence of CAD (coronary artery disease)  0 No evidence of CAD   1-10 Minimal evidence of CAD  11-100 Mild evidence of CAD  101-400 Moderate evidence of CAD  Over 400 Extensive evidence of CAD   Coronary artery calcium (CAC) score is a strong predictor of incident coronary heart disease (CHD) and provides predictive information beyond traditional risk factors. CAC scoring is reasonable to use in  the decision to withhold, postpone, or initiate statin therapy in intermediate-risk or selected borderline-risk asymptomatic adults (age 53-75 years and LDL-C >=70 to <190 mg/dL) who do not have diabetes or established atherosclerotic cardiovascular disease (ASCVD).* In intermediate-risk (10-year ASCVD risk >=7.5% to <20%) adults or selected borderline-risk (10-year ASCVD risk  >=5% to <7.5%) adults in whom a CAC score is measured for the purpose of making a treatment decision the following recommendations have been made:   If CAC=0, it is reasonable to withhold statin therapy and reassess in 5 to 10 years, as long as higher risk conditions are absent (diabetes mellitus, family history of premature CHD in first degree relatives (males <55 years; females <65 years), cigarette smoking, or LDL >=190 mg/dL).   If CAC is 1 to 99, it is reasonable to initiate statin therapy for patients >=42 years of age.   If CAC is >=100 or >=75th percentile, it is reasonable to initiate statin therapy at any age.   Cardiology referral should be considered for patients with CAC scores >=400 or >=75th percentile.   *2018 AHA/ACC/AACVPR/AAPA/ABC/ACPM/ADA/AGS/APhA/ASPC/NLA/PCNA Guideline on the Management of Blood Cholesterol: A Report of the American College of Cardiology/American Heart Association Task Force on Clinical Practice Guidelines. J Am Coll Cardiol. 2019;73(24):3168-3209.

## 2020-10-18 NOTE — Progress Notes (Signed)
Subjective:  Patient ID: [name], female    DOB: 13-Nov-1950  Age: 70 y.o. MRN: [id number]  CC: Gastroesophageal Reflux   HPI [name] presents for CP on Fri after eating spicy enchiladas -  Pt took famotidine last night- not better. On Dexilant every am.  No exertional sx's - a discomfort   Outpatient Medications Prior to Visit  Medication Sig Dispense Refill  . ALPRAZolam (XANAX) 0.25 MG tablet 0.5-1 po bid prn anxiety 30 tablet 1  . Botulinum Toxin Type A, Cosm, 50 units SOLR Inject 4-6 Units into the muscle. 3 month  injection    . cefdinir (OMNICEF) 300 MG capsule 1 capsule    . Cholecalciferol (VITAMIN D3 PO) Take 50 mcg by mouth daily.    . Cyanocobalamin (VITAMIN B-12) 1000 MCG SUBL PLACE 1 TABLET (1,000 MCG TOTAL) UNDER THE TONGUE DAILY. 100 tablet 3  . DEXILANT 60 MG capsule TAKE 1 CAPSULE BY MOUTH EVERY DAY 90 capsule 3  . diltiazem (CARDIZEM CD) 180 MG 24 hr capsule Take 1 capsule (180 mg total) by mouth daily. 30 capsule 3  . diltiazem (CARDIZEM) 30 MG tablet Take 1 tablet (30 mg total) by mouth daily as needed. 30 tablet 1  . diltiazem (TIAZAC) 180 MG 24 hr capsule     . EPIPEN 2-PAK 0.3 MG/0.3ML SOAJ injection See admin instructions. Reported on 10/08/2015  1  . EUCRISA 2 % OINT Apply topically. Apply 1 to 2 times a day    . famotidine (PEPCID) 40 MG tablet TAKE 1 TABLET BY MOUTH EVERY DAY 90 tablet 3  . flecainide (TAMBOCOR) 50 MG tablet Take 1 tablet (50 mg total) by mouth daily as needed. For fast heart rate. 90 tablet 3  . linaclotide (LINZESS) 72 MCG capsule Take 1 capsule (72 mcg total) by mouth daily before breakfast. 90 capsule 3  . loratadine (CLARITIN) 10 MG tablet Take 1 tablet (10 mg total) by mouth daily. 90 tablet 3  . mometasone (NASONEX) 50 MCG/ACT nasal spray 2 sprays in each nostril 3 each 3  . tretinoin (RETIN-A) 0.05 % cream Apply 1 application topically at bedtime.    [name] UNABLE TO FIND once a week. Med Name: allergy shots    .  valACYclovir (VALTREX) 500 MG tablet Take 1 tablet (500 mg total) by mouth 2 (two) times daily. 10 tablet 2   No facility-administered medications prior to visit.    ROS: Review of Systems  Constitutional: Negative for activity change, appetite change, chills, diaphoresis, fatigue, fever and unexpected weight change.  HENT: Negative for congestion, dental problem, ear pain, hearing loss, mouth sores, postnasal drip, sinus pressure, sneezing, sore throat and voice change.   Eyes: Negative for pain and visual disturbance.  Respiratory: Negative for cough, chest tightness, wheezing and stridor.   Cardiovascular: Positive for chest pain. Negative for palpitations and leg swelling.  Gastrointestinal: Negative for abdominal distention, abdominal pain, blood in stool, nausea, rectal pain and vomiting.  Genitourinary: Negative for decreased urine volume, difficulty urinating, dysuria, frequency, hematuria, menstrual problem, vaginal bleeding, vaginal discharge and vaginal pain.  Musculoskeletal: Positive for arthralgias. Negative for back pain, gait problem, joint swelling and neck pain.  Skin: Negative for color change, rash and wound.  Neurological: Negative for dizziness, tremors, syncope, speech difficulty, weakness and light-headedness.  Hematological: Negative for adenopathy.  Psychiatric/Behavioral: Negative for behavioral problems, confusion, decreased concentration, dysphoric mood, hallucinations, sleep disturbance and suicidal ideas. The patient is not nervous/anxious and is not hyperactive.  Objective:  There were no vitals taken for this visit.  BP Readings from Last 3 Encounters:  10/06/20 118/76  09/12/20 122/60  07/11/20 (!) 144/80    Wt Readings from Last 3 Encounters:  10/06/20 102 lb 6.4 oz (46.4 kg)  09/12/20 104 lb (47.2 kg)  07/11/20 103 lb (46.7 kg)    Physical Exam Constitutional:      General: She is not in acute distress.    Appearance: She is  well-developed.  HENT:     Head: Normocephalic.     Right Ear: External ear normal.     Left Ear: External ear normal.     Nose: Nose normal.  Eyes:     General:        Right eye: No discharge.        Left eye: No discharge.     Conjunctiva/sclera: Conjunctivae normal.     Pupils: Pupils are equal, round, and reactive to light.  Neck:     Thyroid: No thyromegaly.     Vascular: No JVD.     Trachea: No tracheal deviation.  Cardiovascular:     Rate and Rhythm: Normal rate and regular rhythm.     Heart sounds: Normal heart sounds.  Pulmonary:     Effort: No respiratory distress.     Breath sounds: No stridor. No wheezing.  Chest:     Chest wall: No tenderness.  Abdominal:     General: Bowel sounds are normal. There is no distension.     Palpations: Abdomen is soft. There is no mass.     Tenderness: There is no abdominal tenderness. There is no guarding or rebound.  Musculoskeletal:        General: No tenderness.     Cervical back: Normal [name] of motion and neck supple.  Lymphadenopathy:     Cervical: No cervical adenopathy.  Skin:    Findings: No erythema or rash.  Neurological:     Cranial Nerves: No cranial nerve deficit.     Motor: No abnormal muscle tone.     Coordination: Coordination normal.     Deep Tendon Reflexes: Reflexes normal.  Psychiatric:        Behavior: Behavior normal.        Thought Content: Thought content normal.        Judgment: Judgment normal.   Procedure: EKG Indication: chest pain Impression: NSR. No acute changes.   Lab Results  Component Value Date   WBC 4.9 02/17/2020   HGB 13.3 02/17/2020   HCT 40.2 02/17/2020   PLT 201 02/17/2020   GLUCOSE 85 02/17/2020   CHOL 208 (H) 01/11/2020   TRIG 81 01/11/2020   HDL 75 01/11/2020   LDLCALC 115 (H) 01/11/2020   ALT 19 01/11/2020   AST 21 01/11/2020   NA 141 02/17/2020   K 4.5 02/17/2020   CL 105 02/17/2020   CREATININE 0.88 02/17/2020   BUN 19 02/17/2020   CO2 29 02/17/2020   TSH  2.14 01/11/2020    CT Head Wo Contrast  Result Date: 08/10/2019 CLINICAL DATA:  Complicated headache syndrome.  Neck pain. EXAM: CT HEAD WITHOUT CONTRAST CT CERVICAL SPINE WITHOUT CONTRAST TECHNIQUE: Multidetector CT imaging of the head and cervical spine was performed following the standard protocol without intravenous contrast. Multiplanar CT image reconstructions of the cervical spine were also generated. COMPARISON:  None. FINDINGS: CT HEAD FINDINGS Brain: No evidence of acute infarction, hemorrhage, hydrocephalus, extra-axial collection or mass lesion/mass effect. Vascular: Negative for hyperdense vessel Skull:  Negative Sinuses/Orbits: Negative Other: None CT CERVICAL SPINE FINDINGS Alignment: 3 mm anterolisthesis C3-4. Mild anterolisthesis C2-3 and C7-T1 Skull base and vertebrae: Negative for fracture or mass. Soft tissues and spinal canal: Negative for mass or soft tissue swelling. Disc levels: Multilevel disc and facet degeneration in the cervical spine. Mild left foraminal narrowing C3-4 due to spurring. Marked left foraminal narrowing C4-5 due to prominent uncinate spurring and facet hypertrophy. Associated spinal stenosis. Moderate spinal stenosis and moderate left foraminal stenosis at C5-6. Mild foraminal narrowing bilaterally C6-7. Bilateral facet degeneration at C7-T1. Upper chest: Apical scarring bilaterally Other: None IMPRESSION: Negative CT head Cervical spondylosis with spinal and foraminal stenosis as above. No acute cervical spine abnormality. Electronically Signed   By: Franchot Gallo M.D.   On: 08/10/2019 09:06   CT CERVICAL SPINE WO CONTRAST  Result Date: 08/10/2019 CLINICAL DATA:  Complicated headache syndrome.  Neck pain. EXAM: CT HEAD WITHOUT CONTRAST CT CERVICAL SPINE WITHOUT CONTRAST TECHNIQUE: Multidetector CT imaging of the head and cervical spine was performed following the standard protocol without intravenous contrast. Multiplanar CT image reconstructions of the cervical  spine were also generated. COMPARISON:  None. FINDINGS: CT HEAD FINDINGS Brain: No evidence of acute infarction, hemorrhage, hydrocephalus, extra-axial collection or mass lesion/mass effect. Vascular: Negative for hyperdense vessel Skull: Negative Sinuses/Orbits: Negative Other: None CT CERVICAL SPINE FINDINGS Alignment: 3 mm anterolisthesis C3-4. Mild anterolisthesis C2-3 and C7-T1 Skull base and vertebrae: Negative for fracture or mass. Soft tissues and spinal canal: Negative for mass or soft tissue swelling. Disc levels: Multilevel disc and facet degeneration in the cervical spine. Mild left foraminal narrowing C3-4 due to spurring. Marked left foraminal narrowing C4-5 due to prominent uncinate spurring and facet hypertrophy. Associated spinal stenosis. Moderate spinal stenosis and moderate left foraminal stenosis at C5-6. Mild foraminal narrowing bilaterally C6-7. Bilateral facet degeneration at C7-T1. Upper chest: Apical scarring bilaterally Other: None IMPRESSION: Negative CT head Cervical spondylosis with spinal and foraminal stenosis as above. No acute cervical spine abnormality. Electronically Signed   By: Franchot Gallo M.D.   On: 08/10/2019 09:06    Assessment & Plan:    [name], MD

## 2020-10-19 ENCOUNTER — Ambulatory Visit: Payer: Medicare Other | Admitting: Internal Medicine

## 2020-10-19 NOTE — Addendum Note (Signed)
Addended by: Earnstine Regal on: 10/19/2020 09:17 AM   Modules accepted: Orders

## 2020-10-21 DIAGNOSIS — J3089 Other allergic rhinitis: Secondary | ICD-10-CM | POA: Diagnosis not present

## 2020-10-26 NOTE — Telephone Encounter (Signed)
Seen by PCP 5/3 for chest tightness and burning.  This is not exertional per his progress note.  Has calcium score ct scan arranged for 11/22/20.  Taking Dexilant and Pepcid was added.  Forwarded to Dr. Angelena Form.

## 2020-10-28 DIAGNOSIS — J3089 Other allergic rhinitis: Secondary | ICD-10-CM | POA: Diagnosis not present

## 2020-10-31 ENCOUNTER — Ambulatory Visit: Payer: Medicare Other | Admitting: Internal Medicine

## 2020-10-31 ENCOUNTER — Telehealth: Payer: Self-pay | Admitting: Physician Assistant

## 2020-10-31 NOTE — Telephone Encounter (Signed)
Patient has been on the [place] for a while. Famotidine was added about 2 weeks ago. She states her symptoms are not worse, but only marginally better. She can feel a burning sensation with swallowing, when she gets up in the morning and after she has eaten. No cough. No CP or dysphagia. Avoiding citrus which prior to this episode, she had been eating grapefruit every morning. She does not consume any coffee. Her question is, should she continue the [place] and Pepcid until seen?

## 2020-10-31 NOTE — Telephone Encounter (Signed)
Yes , definitely - sxs sound like esophageal irritation

## 2020-10-31 NOTE — Telephone Encounter (Signed)
Patient called said she has been having chest discomfort and is not sure if its from acid reflux. Said she was giving Famotidine 40 mg by her PCP and is not sure if she should stop it until her OV with Amy Swannanoa on 11/22/20 please advise.

## 2020-10-31 NOTE — Telephone Encounter (Signed)
Spoke with the patient and advised of this option.

## 2020-11-02 ENCOUNTER — Ambulatory Visit: Payer: Medicare Other | Admitting: Internal Medicine

## 2020-11-03 ENCOUNTER — Ambulatory Visit (INDEPENDENT_AMBULATORY_CARE_PROVIDER_SITE_OTHER): Payer: Medicare Other | Admitting: Internal Medicine

## 2020-11-03 ENCOUNTER — Other Ambulatory Visit: Payer: Self-pay

## 2020-11-03 ENCOUNTER — Ambulatory Visit: Payer: Medicare Other | Admitting: Internal Medicine

## 2020-11-03 ENCOUNTER — Encounter: Payer: Self-pay | Admitting: Internal Medicine

## 2020-11-03 DIAGNOSIS — K219 Gastro-esophageal reflux disease without esophagitis: Secondary | ICD-10-CM

## 2020-11-03 DIAGNOSIS — F439 Reaction to severe stress, unspecified: Secondary | ICD-10-CM

## 2020-11-03 DIAGNOSIS — F419 Anxiety disorder, unspecified: Secondary | ICD-10-CM | POA: Diagnosis not present

## 2020-11-03 MED ORDER — SUCRALFATE 1 GM/10ML PO SUSP: 1.0000 g | Freq: Three times a day (TID) | ORAL | 5 refills | Status: DC

## 2020-11-03 MED ORDER — SUCRALFATE 1 G PO TABS: 1.0000 g | ORAL_TABLET | Freq: Four times a day (QID) | ORAL | 3 refills | Status: DC

## 2020-11-03 NOTE — Assessment & Plan Note (Signed)
Stress discussed 

## 2020-11-03 NOTE — Progress Notes (Signed)
Subjective:  Patient ID: [name], female    DOB: Oct 20, 1950  Age: 70 y.o. MRN: [id number]  CC: Follow-up (Pt states she is still having the chest discomfort..been taking the famotidine but sxs has not subsided )   HPI [name] presents for GERD, burning - not better  Outpatient Medications Prior to Visit  Medication Sig Dispense Refill  . ALPRAZolam (XANAX) 0.25 MG tablet 0.5-1 po bid prn anxiety 30 tablet 1  . Botulinum Toxin Type A, Cosm, 50 units SOLR Inject 4-6 Units into the muscle. 3 month  injection    . Cholecalciferol (VITAMIN D3 PO) Take 50 mcg by mouth daily.    . Cyanocobalamin (VITAMIN B-12) 1000 MCG SUBL PLACE 1 TABLET (1,000 MCG TOTAL) UNDER THE TONGUE DAILY. 100 tablet 3  . DEXILANT 60 MG capsule TAKE 1 CAPSULE BY MOUTH EVERY DAY 90 capsule 3  . diltiazem (CARDIZEM CD) 180 MG 24 hr capsule Take 1 capsule (180 mg total) by mouth daily. 30 capsule 3  . diltiazem (CARDIZEM) 30 MG tablet Take 1 tablet (30 mg total) by mouth daily as needed. 30 tablet 1  . diltiazem (TIAZAC) 180 MG 24 hr capsule     . EPIPEN 2-PAK 0.3 MG/0.3ML SOAJ injection See admin instructions. Reported on 10/08/2015  1  . EUCRISA 2 % OINT Apply topically. Apply 1 to 2 times a day    . famotidine (PEPCID) 40 MG tablet TAKE 1 TABLET BY MOUTH EVERY DAY 90 tablet 3  . flecainide (TAMBOCOR) 50 MG tablet Take 1 tablet (50 mg total) by mouth daily as needed. For fast heart rate. 90 tablet 3  . linaclotide (LINZESS) 72 MCG capsule Take 1 capsule (72 mcg total) by mouth daily before breakfast. 90 capsule 3  . loratadine (CLARITIN) 10 MG tablet Take 1 tablet (10 mg total) by mouth daily. 90 tablet 3  . mometasone (NASONEX) 50 MCG/ACT nasal spray 2 sprays in each nostril 3 each 3  . tretinoin (RETIN-A) 0.05 % cream Apply 1 application topically at bedtime.    [name] UNABLE TO FIND once a week. Med Name: allergy shots    . valACYclovir (VALTREX) 500 MG tablet Take 1 tablet (500 mg total) by mouth 2  (two) times daily. 10 tablet 2  . Nirmatrelvir & Ritonavir (PAXLOVID) 20 x 150 MG & 10 x 100MG  TBPK Take 3 tablets by mouth in the morning and at bedtime. As directed on a package (Patient not taking: Reported on 11/03/2020) 30 tablet 0   No facility-administered medications prior to visit.    ROS: Review of Systems  Constitutional: Negative for activity change, appetite change, chills, fatigue and unexpected weight change.  HENT: Negative for congestion, mouth sores and sinus pressure.   Eyes: Negative for visual disturbance.  Respiratory: Negative for cough and chest tightness.   Gastrointestinal: Negative for abdominal pain and nausea.  Genitourinary: Negative for difficulty urinating, frequency and vaginal pain.  Musculoskeletal: Negative for back pain and gait problem.  Skin: Negative for pallor and rash.  Neurological: Negative for dizziness, tremors, weakness, numbness and headaches.  Psychiatric/Behavioral: Negative for confusion and sleep disturbance. The patient is nervous/anxious.     Objective:  BP 110/72 (BP Location: Left Arm)   Pulse 79   Temp 98.5 F (36.9 C) (Oral)   Ht 5\' 2"  (1.575 m)   Wt 101 lb 3.2 oz (45.9 kg)   SpO2 97%   BMI 18.51 kg/m   BP Readings from Last 3 Encounters:  11/03/20 110/72  10/06/20 118/76  09/12/20 122/60    Wt Readings from Last 3 Encounters:  11/03/20 101 lb 3.2 oz (45.9 kg)  10/06/20 102 lb 6.4 oz (46.4 kg)  09/12/20 104 lb (47.2 kg)    Physical Exam Constitutional:      General: She is not in acute distress.    Appearance: She is well-developed.  HENT:     Head: Normocephalic.     Right Ear: External ear normal.     Left Ear: External ear normal.     Nose: Nose normal.  Eyes:     General:        Right eye: No discharge.        Left eye: No discharge.     Conjunctiva/sclera: Conjunctivae normal.     Pupils: Pupils are equal, round, and reactive to light.  Neck:     Thyroid: No thyromegaly.     Vascular: No JVD.      Trachea: No tracheal deviation.  Cardiovascular:     Rate and Rhythm: Normal rate and regular rhythm.     Heart sounds: Normal heart sounds.  Pulmonary:     Effort: No respiratory distress.     Breath sounds: No stridor. No wheezing.  Abdominal:     General: Bowel sounds are normal. There is no distension.     Palpations: Abdomen is soft. There is no mass.     Tenderness: There is no abdominal tenderness. There is no guarding or rebound.  Musculoskeletal:        General: No tenderness.     Cervical back: Normal [name] of motion and neck supple.  Lymphadenopathy:     Cervical: No cervical adenopathy.  Skin:    Findings: No erythema or rash.  Neurological:     Cranial Nerves: No cranial nerve deficit.     Motor: No abnormal muscle tone.     Coordination: Coordination normal.     Deep Tendon Reflexes: Reflexes normal.  Psychiatric:        Behavior: Behavior normal.        Thought Content: Thought content normal.        Judgment: Judgment normal.     Lab Results  Component Value Date   WBC 4.9 02/17/2020   HGB 13.3 02/17/2020   HCT 40.2 02/17/2020   PLT 201 02/17/2020   GLUCOSE 85 02/17/2020   CHOL 208 (H) 01/11/2020   TRIG 81 01/11/2020   HDL 75 01/11/2020   LDLCALC 115 (H) 01/11/2020   ALT 19 01/11/2020   AST 21 01/11/2020   NA 141 02/17/2020   K 4.5 02/17/2020   CL 105 02/17/2020   CREATININE 0.88 02/17/2020   BUN 19 02/17/2020   CO2 29 02/17/2020   TSH 2.14 01/11/2020    CT Head Wo Contrast  Result Date: 08/10/2019 CLINICAL DATA:  Complicated headache syndrome.  Neck pain. EXAM: CT HEAD WITHOUT CONTRAST CT CERVICAL SPINE WITHOUT CONTRAST TECHNIQUE: Multidetector CT imaging of the head and cervical spine was performed following the standard protocol without intravenous contrast. Multiplanar CT image reconstructions of the cervical spine were also generated. COMPARISON:  None. FINDINGS: CT HEAD FINDINGS Brain: No evidence of acute infarction, hemorrhage,  hydrocephalus, extra-axial collection or mass lesion/mass effect. Vascular: Negative for hyperdense vessel Skull: Negative Sinuses/Orbits: Negative Other: None CT CERVICAL SPINE FINDINGS Alignment: 3 mm anterolisthesis C3-4. Mild anterolisthesis C2-3 and C7-T1 Skull base and vertebrae: Negative for fracture or mass. Soft tissues and spinal canal: Negative for mass or soft tissue swelling.  Disc levels: Multilevel disc and facet degeneration in the cervical spine. Mild left foraminal narrowing C3-4 due to spurring. Marked left foraminal narrowing C4-5 due to prominent uncinate spurring and facet hypertrophy. Associated spinal stenosis. Moderate spinal stenosis and moderate left foraminal stenosis at C5-6. Mild foraminal narrowing bilaterally C6-7. Bilateral facet degeneration at C7-T1. Upper chest: Apical scarring bilaterally Other: None IMPRESSION: Negative CT head Cervical spondylosis with spinal and foraminal stenosis as above. No acute cervical spine abnormality. Electronically Signed   By: Franchot Gallo M.D.   On: 08/10/2019 09:06   CT CERVICAL SPINE WO CONTRAST  Result Date: 08/10/2019 CLINICAL DATA:  Complicated headache syndrome.  Neck pain. EXAM: CT HEAD WITHOUT CONTRAST CT CERVICAL SPINE WITHOUT CONTRAST TECHNIQUE: Multidetector CT imaging of the head and cervical spine was performed following the standard protocol without intravenous contrast. Multiplanar CT image reconstructions of the cervical spine were also generated. COMPARISON:  None. FINDINGS: CT HEAD FINDINGS Brain: No evidence of acute infarction, hemorrhage, hydrocephalus, extra-axial collection or mass lesion/mass effect. Vascular: Negative for hyperdense vessel Skull: Negative Sinuses/Orbits: Negative Other: None CT CERVICAL SPINE FINDINGS Alignment: 3 mm anterolisthesis C3-4. Mild anterolisthesis C2-3 and C7-T1 Skull base and vertebrae: Negative for fracture or mass. Soft tissues and spinal canal: Negative for mass or soft tissue swelling.  Disc levels: Multilevel disc and facet degeneration in the cervical spine. Mild left foraminal narrowing C3-4 due to spurring. Marked left foraminal narrowing C4-5 due to prominent uncinate spurring and facet hypertrophy. Associated spinal stenosis. Moderate spinal stenosis and moderate left foraminal stenosis at C5-6. Mild foraminal narrowing bilaterally C6-7. Bilateral facet degeneration at C7-T1. Upper chest: Apical scarring bilaterally Other: None IMPRESSION: Negative CT head Cervical spondylosis with spinal and foraminal stenosis as above. No acute cervical spine abnormality. Electronically Signed   By: Franchot Gallo M.D.   On: 08/10/2019 09:06    Assessment & Plan:    [name], MD

## 2020-11-03 NOTE — Assessment & Plan Note (Signed)
Worse Jury excuse letter

## 2020-11-03 NOTE — Assessment & Plan Note (Signed)
On Dexilant, d/c Famotidine Try Carafate GI appt on 11/21/20

## 2020-11-04 DIAGNOSIS — J3089 Other allergic rhinitis: Secondary | ICD-10-CM | POA: Diagnosis not present

## 2020-11-07 DIAGNOSIS — J3089 Other allergic rhinitis: Secondary | ICD-10-CM | POA: Diagnosis not present

## 2020-11-09 ENCOUNTER — Telehealth: Payer: Self-pay | Admitting: Physician Assistant

## 2020-11-09 NOTE — Telephone Encounter (Signed)
I believe this may be intended for you

## 2020-11-09 NOTE — Telephone Encounter (Signed)
Patient called to advise that her PCP provider gave her Famotidine but it did not help so now they gave her Carafate on May 19th. And stopped the Famotidine.

## 2020-11-10 ENCOUNTER — Ambulatory Visit: Payer: Medicare Other | Admitting: Internal Medicine

## 2020-11-10 NOTE — Telephone Encounter (Signed)
Responded to by Nicoletta Ba

## 2020-11-11 ENCOUNTER — Telehealth: Payer: Self-pay | Admitting: Internal Medicine

## 2020-11-11 MED ORDER — HYOSCYAMINE SULFATE 0.125 MG PO TABS: 0.1250 mg | ORAL_TABLET | ORAL | 3 refills | Status: DC | PRN

## 2020-11-11 NOTE — Telephone Encounter (Signed)
Patient called and said that sucralfate (CARAFATE) 1 GM/10ML suspension has not been helping. She said that she has been taking it for a week. She was wondering if she needed to get a chest x-ray. Please advise

## 2020-11-11 NOTE — Telephone Encounter (Signed)
I am sorry.   A better test would be a cardiac CT calcium scoring test $99 She needs to call tel # is [phone] and schedule her test time. In the meantime I emailed a prescription for hyoscyamine.  She can try it in place of Carafate. Thx

## 2020-11-11 NOTE — Telephone Encounter (Signed)
Notified pt w/MD response.Pt states she is already set-up for the CT scoring test. Will pick rx up for hyoscyamine.Marland KitchenJohny Chess

## 2020-11-15 ENCOUNTER — Other Ambulatory Visit: Payer: Self-pay

## 2020-11-15 ENCOUNTER — Ambulatory Visit (INDEPENDENT_AMBULATORY_CARE_PROVIDER_SITE_OTHER)
Admission: RE | Admit: 2020-11-15 | Discharge: 2020-11-15 | Disposition: A | Discharge status: Home / Self Care | Payer: Self-pay | Source: Ambulatory Visit | Attending: Internal Medicine | Admitting: Internal Medicine

## 2020-11-15 DIAGNOSIS — E785 Hyperlipidemia, unspecified: Secondary | ICD-10-CM

## 2020-11-17 DIAGNOSIS — J3089 Other allergic rhinitis: Secondary | ICD-10-CM | POA: Diagnosis not present

## 2020-11-21 ENCOUNTER — Encounter: Payer: Self-pay | Admitting: Physician Assistant

## 2020-11-21 ENCOUNTER — Other Ambulatory Visit: Payer: Medicare Other

## 2020-11-21 ENCOUNTER — Ambulatory Visit (INDEPENDENT_AMBULATORY_CARE_PROVIDER_SITE_OTHER): Payer: Medicare Other | Admitting: Physician Assistant

## 2020-11-21 VITALS — BP 140/78 | HR 72 | Ht 62.0 in | Wt 101.8 lb

## 2020-11-21 DIAGNOSIS — K219 Gastro-esophageal reflux disease without esophagitis: Secondary | ICD-10-CM | POA: Diagnosis not present

## 2020-11-21 DIAGNOSIS — K582 Mixed irritable bowel syndrome: Secondary | ICD-10-CM | POA: Diagnosis not present

## 2020-11-21 MED ORDER — FAMOTIDINE 40 MG PO TABS: ORAL_TABLET | ORAL | 2 refills | Status: DC

## 2020-11-21 MED ORDER — DEXILANT 60 MG PO CPDR: 60.0000 mg | DELAYED_RELEASE_CAPSULE | Freq: Every day | ORAL | 3 refills | Status: DC

## 2020-11-21 NOTE — Patient Instructions (Signed)
If you are age 68 or older, your body mass index should be between 23-30. Your Body mass index is 18.62 kg/m. If this is out of the aforementioned range listed, please consider follow up with your Primary Care Provider. __________________________________________________________  The [facility] providers would like to encourage you to use MYCHART to communicate with providers for non-urgent requests or questions.  Due to long hold times on the telephone, sending your provider a message by MYCHART may be a faster and more efficient way to get a response.  Please allow 48 business hours for a response.  Please remember that this is for non-urgent requests.   Your provider has requested that you go to the basement level for lab work before leaving today. Press "B" on the elevator. The lab is located at the first door on the left as you exit the elevator.  You will receive a test for your Lactose Breath Test through the mail from a company named [facility]. Make sure to return your test in the mail using the return mailing label given to you along with the kit. Your demographic and insurance information have already been sent to the company and they should be in contact with you over the next week regarding this test.  Make sure to discuss with [facility] PRIOR to having the test if they have gotten informatoin from your insurance company as to how much your testing will cost out of pocket, if any. Please keep in mind that you will be getting a call from phone number [phone] or a similar number. If you do not hear from them within this time frame, please call our office at [phone].   Start anti-reflux diet/regimen.  Thank you for entrusting me with your care and choosing [facility].  Amy Esterwood, PA-C 

## 2020-11-21 NOTE — Progress Notes (Signed)
Subjective:    Patient ID: [name], female    DOB: June 15, 1951, 70 y.o.   MRN: [id number]  HPI [name] is a 70 year old white female, established with Dr. Marina Goodell, last seen in the office in 2019.  She comes in today after a recent exacerbation of esophageal symptoms with burning and heartburn. She has history of GERD, LPR and globus sensation.  Also with IBS, anxiety, BPPV and history of migraines. She had colonoscopy done in 2014 for screening and this was normal. Very remote EGD done in 2000 normal.  She had barium swallow after her office visit in 2019 which was normal. She had been maintained on Dexilant 60 mg p.o. every morning.  She says that she was trying to wean down on the dose of the Dexilant and per Dr. Loren Racer instruction had been opening the capsule and just taking half of it per day.  This was in March/April.  Around that same time she started eating grapefruit every morning for breakfast.  On 10/07/2020 she had a spicy meal with enchiladas and then a week or so after that had eaten cake and ice cream which is quite unusual for her as she usually follows a lactose-free diet.  She started noticing increased burning discomfort in her esophagus which she said became fairly constant.  She has no complaints of dysphagia or odynophagia, no abdominal pain no nausea or vomiting. She was seen by Dr. Posey Rea who added famotidine at bedtime x2 weeks, after that she still was having some symptoms so she was given a 2-week course of Carafate 4 times daily.  She had remained on the 30 mg of Dexilant.  At this point she says she is definitely feeling better than she was but is still having symptoms though not as constant and not as intense. She did have an EKG done and a cardiac CT both of which were unremarkable with a calcium score of 0. She is currently not having any nocturnal symptoms but frequently will feel symptoms early in the morning. She is asking about the possibility of  gluten intolerance, and also about lactose intolerance.  She has for the most part avoid lactose for the past several years, does not generally avoid gluten but is wondering if either of these things could have to do with her current symptoms and/or underlying IBS symptoms.  Review of Systems Pertinent positive and negative review of systems were noted in the above HPI section.  All other review of systems was otherwise negative.  Outpatient Encounter Medications as of 11/21/2020  Medication Sig  . ALPRAZolam (XANAX) 0.25 MG tablet 0.5-1 po bid prn anxiety  . Botulinum Toxin Type A, Cosm, 50 units SOLR Inject 4-6 Units into the muscle. 3 month  injection  . Cholecalciferol (VITAMIN D3 PO) Take 50 mcg by mouth daily.  . Cyanocobalamin (VITAMIN B-12) 1000 MCG SUBL PLACE 1 TABLET (1,000 MCG TOTAL) UNDER THE TONGUE DAILY.  [name] diltiazem (CARDIZEM) 30 MG tablet Take 1 tablet (30 mg total) by mouth daily as needed.  . diltiazem (TIAZAC) 180 MG 24 hr capsule   . EPIPEN 2-PAK 0.3 MG/0.3ML SOAJ injection See admin instructions. Reported on 10/08/2015  . EUCRISA 2 % OINT Apply topically. Apply 1 to 2 times a day  . famotidine (PEPCID) 40 MG tablet Take 1 tablet 1 hour before bed  . flecainide (TAMBOCOR) 50 MG tablet Take 1 tablet (50 mg total) by mouth daily as needed. For fast heart rate.  . hyoscyamine (LEVSIN) 0.125 MG  tablet Take 1 tablet (0.125 mg total) by mouth every 4 (four) hours as needed for up to 10 days (chest spasms).  . loratadine (CLARITIN) 10 MG tablet Take 1 tablet (10 mg total) by mouth daily.  . mometasone (NASONEX) 50 MCG/ACT nasal spray 2 sprays in each nostril  . tretinoin (RETIN-A) 0.05 % cream Apply 1 application topically at bedtime.  [name] UNABLE TO FIND once a week. Med Name: allergy shots  . valACYclovir (VALTREX) 500 MG tablet Take 1 tablet (500 mg total) by mouth 2 (two) times daily.  . [DISCONTINUED] DEXILANT 60 MG capsule TAKE 1 CAPSULE BY MOUTH EVERY DAY  . DEXILANT 60 MG  capsule Take 1 capsule (60 mg total) by mouth daily.  [name] linaclotide (LINZESS) 72 MCG capsule Take 1 capsule (72 mcg total) by mouth daily before breakfast. (Patient not taking: Reported on 11/21/2020)  . [DISCONTINUED] diltiazem (CARDIZEM CD) 180 MG 24 hr capsule Take 1 capsule (180 mg total) by mouth daily.   No facility-administered encounter medications on file as of 11/21/2020.   Allergies  Allergen Reactions  . Codeine Other (See Comments)    Doesn't remeber  . Doxycycline Other (See Comments)  . Epinephrine     Heart racing   . Neomycin     Topical rash from cream  . Penicillins Hives and Other (See Comments)    Has patient had a PCN reaction causing immediate rash, facial/tongue/throat swelling, SOB or lightheadedness with hypotension: Yes Has patient had a PCN reaction causing severe rash involving mucus membranes or skin necrosis: Yes Has patient had a PCN reaction that required hospitalization No Has patient had a PCN reaction occurring within the last 10 years: No If all of the above answers are "NO", then may proceed with Cephalosporin use.   . Protonix [Pantoprazole Sodium]     ?HAs  . Sulfonamide Derivatives Other (See Comments)    Doesn't remember   . Zithromax [Azithromycin] Rash   Patient Active Problem List   Diagnosis Date Noted  . Anxiety 11/03/2020  . Stress 05/30/2020  . Thoracic spine pain 02/17/2020  . Allergic reaction to insect bite 10/21/2019  . Herpes zoster 09/17/2019  . Tachycardia 08/10/2019  . Vaccination complication 08/10/2019  . Vaccine reaction 07/20/2019  . Migraine without aura and without status migrainosus, not intractable 05/27/2019  . Migraine aura without headache 05/27/2019  . Myoneural disorder, unspecified (HCC) 04/09/2019  . Sore throat 06/13/2018  . Friction injury to skin 04/14/2018  . Occipital neuralgia 03/03/2018  . B12 deficiency 03/03/2018  . Ocular migraine 02/05/2018  . Leg wound, right 12/26/2017  . Bilateral impacted  cerumen 10/21/2017  . LLQ abdominal pain 09/23/2017  . Elevated antinuclear antibody (ANA) level 07/26/2017  . Arthralgia 07/26/2017  . Degenerative disc disease, lumbar 07/04/2017  . Cough 06/06/2017  . Chest discomfort 03/21/2017  . Neck mass 01/17/2017  . Mass of neck 12/25/2016  . Snoring 12/18/2016  . Medial tibial stress syndrome, left, initial encounter 08/14/2016  . Patellofemoral syndrome of both knees 08/09/2016  . Drug allergy, antibiotic 05/31/2016  . Hyperkalemia 02/17/2016  . Vertical diplopia 02/15/2016  . Tension headache 12/28/2015  . BPPV (benign paroxysmal positional vertigo) 10/08/2015  . TMJ click 09/13/2015  . Cold sore 07/18/2015  . Osteopenia 07/18/2015  . Earache on left 07/18/2015  . Cervical disc disorder with radiculopathy of cervical region 06/17/2015  . Cotton wool spots 03/15/2015  . Low back pain 03/08/2015  . Food poisoning 03/08/2015  . Generalized anxiety disorder  03/08/2015  . Dysuria 02/23/2015  . Well adult exam 09/09/2014  . Internal hemorrhoids 09/07/2014  . Allergic urticaria 08/20/2014  . Acute sinusitis 08/18/2014  . Neck muscle spasm 07/30/2014  . Grief 06/17/2014  . Mild aortic insufficiency   . Mild tricuspid regurgitation   . Vasovagal syncope   . Premature atrial contractions   . GERD (gastroesophageal reflux disease)   . PVC (premature ventricular contraction)   . Dense breast tissue 03/23/2014  . Microhematuria 03/22/2014  . Changing skin lesion 02/05/2014  . Rash and nonspecific skin eruption 12/22/2013  . Chronic meniscal tear of knee 12/21/2013  . Benign fasciculations 12/11/2013  . Strain of adductor magnus muscle of left lower extremity 05/21/2013  . Benign fasciculation-cramp syndrome 12/03/2012  . Vasovagal near syncope 11/02/2012  . Migraine with aura 11/02/2012  . Upper airway cough syndrome 08/16/2012  . Bunion of great toe 09/11/2011  . Routine health maintenance 09/11/2011  . Mild mitral regurgitation by  prior echocardiogram 05/15/2011  . Chronic neck pain 03/07/2011  . Skin lesion 03/07/2011  . DYSPLASTIC NEVUS, FACE 02/06/2010  . HAND PAIN, BILATERAL 08/25/2009  . Aortic valve disorder 08/02/2009  . Abnormal involuntary movements(781.0) 07/28/2009  . LUMBAR SPRAIN AND STRAIN 05/21/2009  . Palpitations 02/03/2009  . HEMORRHOIDS-INTERNAL 12/15/2008  . Constipation 12/15/2008  . IBS (irritable bowel syndrome) 12/15/2008  . RECTAL BLEEDING 12/15/2008  . RENAL CALCULUS, HX OF 11/29/2007  . Herpes simplex virus (HSV) infection 11/03/2007  . Vaginal atrophy 03/25/2007  . GLOBUS HYSTERICUS 01/01/2007  . RHINITIS, ALLERGIC NOS 01/01/2007   Social History   Socioeconomic History  . Marital status: Married    Spouse name: Vernia Buff  . Number of children: 0  . Years of education: 5  . Highest education level: Not on file  Occupational History  . Occupation: OFFICE liasion    Employer: [employer]  . Occupation: Engineer, civil (consulting)  . Occupation: OFFICE LE[employer]ON    Employer: [employer]  Tobacco Use  . Smoking status: Never Smoker  . Smokeless tobacco: Never Used  Vaping Use  . Vaping Use: Never used  Substance and Sexual Activity  . Alcohol use: No  . Drug use: No  . Sexual activity: Not Currently  Other Topics Concern  . Not on file  Social History Narrative   Vocational School in [place]. Married '90 - marriage is in very good shape '12, No children.  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   Patient is working Pensions consultant work.   Patient has a high school education.   Patient is right-handed.   Patient does not drink any caffeine.      Social Determinants of Health   Financial Resource Strain: Not on file  Food Insecurity: Not on file   Transportation Needs: Not on file  Physical Activity: Not on file  Stress: Not on file  Social Connections: Not on file  Intimate Partner Violence: Not on file    Ms. Pipkins's family history includes COPD in her father; Headache in her sister; Hyperlipidemia in her father and mother; Hypertension in her father and mother.      Objective:    Vitals:   11/21/20 1449  BP: 140/78  Pulse: 72    Physical Exam Well-developed well-nourished older white  female in no acute distress.  Height, Weight, 101 BMI 18.6  HEENT; nontraumatic normocephalic, EOMI, PE R LA, sclera anicteric.  Neuro/Psych; alert and oriented x4, grossly nonfocal mood and affect appropriate       Assessment & Plan:   #50 70 year old white female with history of chronic GERD/LPR who comes in with recent exacerbation of burning in her chest, heartburn after self reducing her Dexilant from 60 mg to 30 mg daily, and having some dietary indiscretion over her baseline.  I think her symptoms are consistent with GERD and are actually improving at this point, she may have had a component of mild esophagitis.  #2 probable lactose intolerance-patient would like to be tested #3 underlying IBS 4.  Colon cancer screening-up-to-date with negative colonoscopy 2014 and indicated for interval follow-up 2024  Plan; reinstitute strict antireflux regimen and diet, n.p.o. for 3 hours prior to bedtime, elevate the back 45 degrees for sleep Restart Dexilant 60 mg p.o. every morning AC breakfast.  We will also add a 1 month course of Pepcid 40 mg p.o. every afternoon 1 hour before bed.  If she is feeling better we will plan to discontinue the Pepcid after 1 month. Patient will follow-up with Dr. Marina Goodell or myself on an as-needed basis. Plan colonoscopy 2024  Shemekia Patane Oswald Hillock PA-C 11/21/2020   Cc: Plotnikov, Georgina Quint, MD

## 2020-11-22 ENCOUNTER — Inpatient Hospital Stay: Admission: RE | Admit: 2020-11-22 | Payer: Medicare Other | Source: Ambulatory Visit

## 2020-11-22 ENCOUNTER — Other Ambulatory Visit: Payer: Medicare Other

## 2020-11-22 DIAGNOSIS — Z808 Family history of malignant neoplasm of other organs or systems: Secondary | ICD-10-CM | POA: Diagnosis not present

## 2020-11-22 DIAGNOSIS — D2230 Melanocytic nevi of unspecified part of face: Secondary | ICD-10-CM | POA: Diagnosis not present

## 2020-11-22 DIAGNOSIS — D225 Melanocytic nevi of trunk: Secondary | ICD-10-CM | POA: Diagnosis not present

## 2020-11-22 DIAGNOSIS — L821 Other seborrheic keratosis: Secondary | ICD-10-CM | POA: Diagnosis not present

## 2020-11-22 DIAGNOSIS — L578 Other skin changes due to chronic exposure to nonionizing radiation: Secondary | ICD-10-CM | POA: Diagnosis not present

## 2020-11-22 DIAGNOSIS — Z85828 Personal history of other malignant neoplasm of skin: Secondary | ICD-10-CM | POA: Diagnosis not present

## 2020-11-22 DIAGNOSIS — Z86018 Personal history of other benign neoplasm: Secondary | ICD-10-CM | POA: Diagnosis not present

## 2020-11-22 LAB — TISSUE TRANSGLUTAMINASE, IGA: (tTG) Ab, IgA: <1 U/mL

## 2020-11-22 LAB — IGA: Immunoglobulin A: 261 mg/dL (ref 70–320)

## 2020-11-22 NOTE — Progress Notes (Signed)
Noted  

## 2020-11-24 DIAGNOSIS — J3089 Other allergic rhinitis: Secondary | ICD-10-CM | POA: Diagnosis not present

## 2020-12-02 DIAGNOSIS — J3089 Other allergic rhinitis: Secondary | ICD-10-CM | POA: Diagnosis not present

## 2020-12-09 DIAGNOSIS — J3089 Other allergic rhinitis: Secondary | ICD-10-CM | POA: Diagnosis not present

## 2020-12-14 DIAGNOSIS — J3089 Other allergic rhinitis: Secondary | ICD-10-CM | POA: Diagnosis not present

## 2020-12-15 DIAGNOSIS — H04222 Epiphora due to insufficient drainage, left lacrimal gland: Secondary | ICD-10-CM | POA: Diagnosis not present

## 2020-12-16 ENCOUNTER — Other Ambulatory Visit: Payer: Medicare Other

## 2020-12-16 ENCOUNTER — Other Ambulatory Visit (INDEPENDENT_AMBULATORY_CARE_PROVIDER_SITE_OTHER): Payer: Medicare Other

## 2020-12-16 ENCOUNTER — Other Ambulatory Visit: Payer: Self-pay

## 2020-12-16 DIAGNOSIS — F411 Generalized anxiety disorder: Secondary | ICD-10-CM

## 2020-12-16 DIAGNOSIS — T881XXS Other complications following immunization, not elsewhere classified, sequela: Secondary | ICD-10-CM | POA: Diagnosis not present

## 2020-12-16 DIAGNOSIS — E785 Hyperlipidemia, unspecified: Secondary | ICD-10-CM | POA: Diagnosis not present

## 2020-12-16 DIAGNOSIS — R002 Palpitations: Secondary | ICD-10-CM

## 2020-12-16 LAB — COMPREHENSIVE METABOLIC PANEL
ALT: 15 U/L (ref 0–35)
AST: 18 U/L (ref 0–37)
Albumin: 4.5 g/dL (ref 3.5–5.2)
Alkaline Phosphatase: 70 U/L (ref 39–117)
BUN: 23 mg/dL (ref 6–23)
CO2: 30 mEq/L (ref 19–32)
Calcium: 9.8 mg/dL (ref 8.4–10.5)
Chloride: 104 mEq/L (ref 96–112)
Creatinine, Ser: 0.87 mg/dL (ref 0.40–1.20)
GFR: 68.59 mL/min (ref >60.00)
Glucose, Bld: 81 mg/dL (ref 70–99)
Potassium: 4.6 mEq/L (ref 3.5–5.1)
Sodium: 141 mEq/L (ref 135–145)
Total Bilirubin: 0.8 mg/dL (ref 0.2–1.2)
Total Protein: 7.3 g/dL (ref 6.0–8.3)

## 2020-12-16 LAB — URINALYSIS
Bilirubin Urine: NEGATIVE
Ketones, ur: NEGATIVE
Leukocytes,Ua: NEGATIVE
Nitrite: NEGATIVE
Specific Gravity, Urine: 1.025 (ref 1.000–1.030)
Total Protein, Urine: NEGATIVE
Urine Glucose: NEGATIVE
Urobilinogen, UA: 0.2 (ref 0.0–1.0)
pH: 6 (ref 5.0–8.0)

## 2020-12-16 LAB — LIPID PANEL
Cholesterol: 192 mg/dL (ref 0–200)
HDL: 74.3 mg/dL (ref >39.00)
LDL Cholesterol: 107 mg/dL — ABNORMAL HIGH (ref 0–99)
NonHDL: 117.71
Total CHOL/HDL Ratio: 3
Triglycerides: 52 mg/dL (ref 0.0–149.0)
VLDL: 10.4 mg/dL (ref 0.0–40.0)

## 2020-12-16 LAB — CBC WITH DIFFERENTIAL/PLATELET
Basophils Absolute: 0.1 10*3/uL (ref 0.0–0.1)
Basophils Relative: 1.1 % (ref 0.0–3.0)
Eosinophils Absolute: 0.1 10*3/uL (ref 0.0–0.7)
Eosinophils Relative: 2.3 % (ref 0.0–5.0)
HCT: 38.1 % (ref 36.0–46.0)
Hemoglobin: 12.9 g/dL (ref 12.0–15.0)
Lymphocytes Relative: 37.6 % (ref 12.0–46.0)
Lymphs Abs: 1.7 10*3/uL (ref 0.7–4.0)
MCHC: 33.8 g/dL (ref 30.0–36.0)
MCV: 92.1 fl (ref 78.0–100.0)
Monocytes Absolute: 0.5 10*3/uL (ref 0.1–1.0)
Monocytes Relative: 10.3 % (ref 3.0–12.0)
Neutro Abs: 2.2 10*3/uL (ref 1.4–7.7)
Neutrophils Relative %: 48.7 % (ref 43.0–77.0)
Platelets: 170 10*3/uL (ref 150.0–400.0)
RBC: 4.14 Mil/uL (ref 3.87–5.11)
RDW: 13.2 % (ref 11.5–15.5)
WBC: 4.5 10*3/uL (ref 4.0–10.5)

## 2020-12-16 LAB — TSH: TSH: 1.61 u[IU]/mL (ref 0.35–5.50)

## 2020-12-17 ENCOUNTER — Other Ambulatory Visit: Payer: Self-pay | Admitting: Internal Medicine

## 2020-12-17 DIAGNOSIS — K219 Gastro-esophageal reflux disease without esophagitis: Secondary | ICD-10-CM

## 2020-12-17 DIAGNOSIS — E538 Deficiency of other specified B group vitamins: Secondary | ICD-10-CM

## 2020-12-19 LAB — SARS-COV-2 SEMI-QUANTITATIVE TOTAL ANTIBODY, SPIKE: SARS COV2 AB, Total Spike Semi QN: >2500 U/mL — ABNORMAL HIGH (ref <0.8)

## 2020-12-20 NOTE — Progress Notes (Signed)
Rito Ehrlich, am serving as a Education administrator for Dr. Lynne Leader.   Subjective:    CC: Left knee pain  HPI: Pt is a 70 y/o female c/o L knee pain x1 week. Pt was previously seen by Dr. Tamala Julian on 06/17/19 for neck pain. Today, pt locates pain to Lateral L knee pain and sometimes the tendon behind the knee will hurt from calf to right above the knee. Patient started the rowing machine and has started some impact exercises at her gym that she has not done in the past. Patient states the pain comes and goes and today it is not hurting.   L knee swelling: no  Mechanical symptoms: no Aggravates: first thing in the morning and sometimes with bending  Treatments tried: no   Pertinent review of Systems: No fevers or chills  Relevant historical information: Mild valvular heart disease.   Objective:    Vitals:   12/21/20 1450  BP: 130/70  Pulse: 71  SpO2: 98%   General: Well Developed, well nourished, and in no acute distress.  Petite fit athletic woman.  MSK: Left knee normal-appearing Normal motion. Mildly tender palpation lateral femoral condyle and distal IT band. Not particularly tender palpation posterior lateral knee along course of distal hamstring tendon or proximal calf. Intact strength. Stable ligamentous exam. Negative McMurray's test.  Spine nontender normal lumbar motion negative slump test.  Lab and Radiology Results  Diagnostic Limited MSK Ultrasound of: Left knee Quad tendon intact normal-appearing Trace joint effusion. Patellar tendon normal. Medial joint line normal-appearing Lateral joint line very slightly narrowed without clear torn lateral meniscus. Distal IT band intact with mild hypoechoic change deep to the IT band consistent with mild distal IT band friction syndrome. Posterior knee no Baker's cyst. Impression: Distal IT band syndrome    Impression and Recommendations:    Assessment and Plan: 70 y.o. female with left knee pain.  Patient  has anterior/lateral knee pain that is most likely related to distal IT band syndrome.  Additionally she has some posterior lateral pain which is less clear cause at this time.  Plan to treat with home exercise program and Voltaren gel for distal IT band syndrome and provide some home exercise program for potential hamstring tendinitis.  This should work quite well however if not improving would consider formal referral to physical therapy or nitroglycerin patches or even x-ray MRI or injection..  Check back in about a month especially if not improved. Home exercises taught today in clinic by ATC.  PDMP not reviewed this encounter. Orders Placed This Encounter  Procedures   [place] LIMITED JOINT SPACE STRUCTURES LOW LEFT(NO LINKED CHARGES)    Standing Status:   Future    Number of Occurrences:   1    Standing Expiration Date:   12/21/2021    Order Specific Question:   Reason for Exam (SYMPTOM  OR DIAGNOSIS REQUIRED)    Answer:   Left knee pain    Order Specific Question:   Preferred imaging location?    Answer:   Internal   No orders of the defined types were placed in this encounter.   Discussed warning signs or symptoms. Please see discharge instructions. Patient expresses understanding.   The above documentation has been reviewed and is accurate and complete Lynne Leader, M.D. Total encounter time 30 minutes including face-to-face time with the patient and, reviewing past medical record, and charting on the date of service.   Time excludes time to perform ultrasound.  Discussed treatment plan  options and potential next steps

## 2020-12-21 ENCOUNTER — Encounter: Payer: Self-pay | Admitting: Family Medicine

## 2020-12-21 ENCOUNTER — Other Ambulatory Visit: Payer: Self-pay

## 2020-12-21 ENCOUNTER — Ambulatory Visit: Payer: Self-pay

## 2020-12-21 ENCOUNTER — Ambulatory Visit (INDEPENDENT_AMBULATORY_CARE_PROVIDER_SITE_OTHER): Payer: Medicare Other | Admitting: Family Medicine

## 2020-12-21 VITALS — BP 130/70 | HR 71 | Ht 62.0 in | Wt 100.0 lb

## 2020-12-21 DIAGNOSIS — M25562 Pain in left knee: Secondary | ICD-10-CM | POA: Diagnosis not present

## 2020-12-21 DIAGNOSIS — M7632 Iliotibial band syndrome, left leg: Secondary | ICD-10-CM

## 2020-12-21 NOTE — Patient Instructions (Addendum)
Thank you for coming in today.   Please use Voltaren gel (Generic Diclofenac Gel) up to 4x daily for pain as needed.  This is available over-the-counter as both the name brand Voltaren gel and the generic diclofenac gel.   I think the primary problem is distal IT band syndrome.  You may also have hamstring tenontitis.   Modify activity and do the home exercises. Please complete the exercises that the athletic trainer went over with you:  View at [email or web address] using code: [email or web address]  Recheck in 1 month specially if not better.   Let me know sooner if you are not improving. Physical Therapy would help a lot.   Keep me updated.

## 2020-12-22 DIAGNOSIS — J3089 Other allergic rhinitis: Secondary | ICD-10-CM | POA: Diagnosis not present

## 2020-12-29 DIAGNOSIS — J3089 Other allergic rhinitis: Secondary | ICD-10-CM | POA: Diagnosis not present

## 2021-01-01 ENCOUNTER — Encounter (HOSPITAL_COMMUNITY): Payer: Self-pay

## 2021-01-01 ENCOUNTER — Ambulatory Visit (HOSPITAL_COMMUNITY)
Admission: RE | Admit: 2021-01-01 | Discharge: 2021-01-01 | Disposition: A | Discharge status: Home / Self Care | Payer: Medicare Other | Source: Ambulatory Visit | Attending: Student | Admitting: Student

## 2021-01-01 ENCOUNTER — Other Ambulatory Visit: Payer: Self-pay

## 2021-01-01 VITALS — BP 155/80 | HR 84 | Temp 98.7°F | Resp 16

## 2021-01-01 DIAGNOSIS — B009 Herpesviral infection, unspecified: Secondary | ICD-10-CM

## 2021-01-01 MED ORDER — ACYCLOVIR 5 % EX CREA: 1.0000 "application " | TOPICAL_CREAM | Freq: Every day | CUTANEOUS | 0 refills | Status: AC

## 2021-01-01 NOTE — Discharge Instructions (Addendum)
-  Complete course of valacyclovir -You can try the acyclovir cream, applied to outer lip 5x daily x4 days.  -Follow-up with PCP as scheduled next week.

## 2021-01-01 NOTE — ED Triage Notes (Signed)
Pt reports having a  sore in the lower lip x 3 days. Reports she had botox around the lower lip 5 days ago, started feeling the lower lip felt hot 1 day after the Botox injection. Pt started Valtrex 3 days ago, as she think it may be a herpes break out.

## 2021-01-01 NOTE — ED Provider Notes (Signed)
[facility]    CSN: [id number] Arrival date & time: 01/01/21  1035      History   Chief Complaint Chief Complaint  Patient presents with   Appointment    1100   lip problem    HPI [name] is a 70 y.o. female presenting with sore on lip. Medical history cold sores.  States she had Botox performed about 5 days ago, following this she had burning and stinging of the lower lip followed by a vesicular rash that has now crusted over somewhat.  Initially painful, but now is painless.  She already has a prescription of Valtrex as prescribed by her primary care for this, she has been taking this with some improvement.  States she wanted to make sure that this was her typical cold sores.  She has enough Valtrex for 5 more days, and she has a follow-up with her primary care next week.  HPI  Past Medical History:  Diagnosis Date   Allergic rhinitis, cause unspecified    Backache, unspecified    Benign fasciculation-cramp syndrome 12/03/2012    Worsening with  stress, fatigue .   Calculus of kidney    Conversion disorder    Cotton wool spots    Esophageal reflux    Floater, vitreous, left 05/2016   GERD (gastroesophageal reflux disease)    Hemorrhage of rectum and anus    Internal hemorrhoids without mention of complication    Irritable bowel syndrome    Microscopic hematuria    Migraine headache with aura    Mild aortic insufficiency    Mild tricuspid regurgitation    Osteopenia    Other plastic surgery for unacceptable cosmetic appearance    Inj tx fllers/expander   Personal history of urinary calculi    Postmenopausal atrophic vaginitis    Premature atrial contractions    PVC (premature ventricular contraction)    a. Event monitor 2013.   Unspecified constipation    Vasovagal syncope     Patient Active Problem List   Diagnosis Date Noted   Anxiety 11/03/2020   Stress 05/30/2020   Thoracic spine pain 02/17/2020   Allergic reaction to insect bite  10/21/2019   Herpes zoster 09/17/2019   Tachycardia 08/10/2019   Vaccination complication 42/68/3419   Vaccine reaction 07/20/2019   Migraine without aura and without status migrainosus, not intractable 05/27/2019   Migraine aura without headache 05/27/2019   Myoneural disorder, unspecified ([facility]) 04/09/2019   Sore throat 06/13/2018   Friction injury to skin 04/14/2018   Occipital neuralgia 03/03/2018   B12 deficiency 03/03/2018   Ocular migraine 02/05/2018   Leg wound, right 12/26/2017   Bilateral impacted cerumen 10/21/2017   LLQ abdominal pain 09/23/2017   Elevated antinuclear antibody (ANA) level 07/26/2017   Arthralgia 07/26/2017   Degenerative disc disease, lumbar 07/04/2017   Cough 06/06/2017   Chest discomfort 03/21/2017   Neck mass 01/17/2017   Mass of neck 12/25/2016   Snoring 12/18/2016   Medial tibial stress syndrome, left, initial encounter 08/14/2016   Patellofemoral syndrome of both knees 08/09/2016   Drug allergy, antibiotic 05/31/2016   Hyperkalemia 02/17/2016   Vertical diplopia 02/15/2016   Tension headache 12/28/2015   BPPV (benign paroxysmal positional vertigo) 62/22/9798   TMJ click 92/04/9416   Cold sore 07/18/2015   Osteopenia 07/18/2015   Earache on left 07/18/2015   Cervical disc disorder with radiculopathy of cervical region 06/17/2015   Cotton wool spots 03/15/2015   Low back pain 03/08/2015   Food poisoning  03/08/2015   Generalized anxiety disorder 03/08/2015   Dysuria 02/23/2015   Well adult exam 09/09/2014   Internal hemorrhoids 09/07/2014   Allergic urticaria 08/20/2014   Acute sinusitis 08/18/2014   Neck muscle spasm 07/30/2014   Grief 06/17/2014   Mild aortic insufficiency    Mild tricuspid regurgitation    Vasovagal syncope    Premature atrial contractions    GERD (gastroesophageal reflux disease)    PVC (premature ventricular contraction)    Dense breast tissue 03/23/2014   Microhematuria 03/22/2014   Changing skin lesion  02/05/2014   Rash and nonspecific skin eruption 12/22/2013   Chronic meniscal tear of knee 12/21/2013   Benign fasciculations 12/11/2013   Strain of adductor magnus muscle of left lower extremity 05/21/2013   Benign fasciculation-cramp syndrome 12/03/2012   Vasovagal near syncope 11/02/2012   Migraine with aura 11/02/2012   Upper airway cough syndrome 08/16/2012   Bunion of great toe 09/11/2011   Routine health maintenance 09/11/2011   Mild mitral regurgitation by prior echocardiogram 05/15/2011   Chronic neck pain 03/07/2011   Skin lesion 03/07/2011   DYSPLASTIC NEVUS, FACE 02/06/2010   HAND PAIN, BILATERAL 08/25/2009   Aortic valve disorder 08/02/2009   Abnormal involuntary movements(781.0) 07/28/2009   LUMBAR SPRAIN AND STRAIN 05/21/2009   Palpitations 02/03/2009   HEMORRHOIDS-INTERNAL 12/15/2008   Constipation 12/15/2008   IBS (irritable bowel syndrome) 12/15/2008   RECTAL BLEEDING 12/15/2008   RENAL CALCULUS, HX OF 11/29/2007   Herpes simplex virus (HSV) infection 11/03/2007   Vaginal atrophy 03/25/2007   GLOBUS HYSTERICUS 01/01/2007   RHINITIS, ALLERGIC NOS 01/01/2007    Past Surgical History:  Procedure Laterality Date   BREAST BIOPSY Right    Cosmetic Procedures w/Injection therapy     SKIN CANCER EXCISION Left    thigh   WISDOM TOOTH EXTRACTION      OB History   No obstetric history on file.      Home Medications    Prior to Admission medications   Medication Sig Start Date End Date Taking? Authorizing Provider  acyclovir cream (ZOVIRAX) 5 % Apply 1 application topically 5 (five) times daily for 4 days. 01/01/21 01/05/21 Yes Hazel Sams, PA-C  ALPRAZolam Duanne Moron) 0.25 MG tablet 0.5-1 po bid prn anxiety 05/07/19   Plotnikov, [name], MD  Botulinum Toxin Type A, Cosm, 50 units SOLR Inject 4-6 Units into the muscle. 3 month  injection    [provider]  Cholecalciferol (VITAMIN D3 PO) Take 50 mcg by mouth daily.    [provider]   Cyanocobalamin (VITAMIN B-12) 1000 MCG SUBL PLACE 1 TABLET (1,000 MCG TOTAL) UNDER THE TONGUE DAILY. 01/19/20   Plotnikov, [name], MD  DEXILANT 60 MG capsule Take 1 capsule (60 mg total) by mouth daily. 11/21/20   Esterwood, Amy S, PA-C  diltiazem (CARDIZEM) 30 MG tablet Take 1 tablet (30 mg total) by mouth daily as needed. 12/01/19   Camnitz, Will Hassell Done, MD  diltiazem [facility]) 180 MG 24 hr capsule  05/16/20   [provider]  EPIPEN 2-PAK 0.3 MG/0.3ML SOAJ injection See admin instructions. Reported on 10/08/2015 06/30/14   [provider]  EUCRISA 2 % OINT Apply topically. Apply 1 to 2 times a day 09/02/20   [provider]  famotidine (PEPCID) 40 MG tablet Take 1 tablet 1 hour before bed 11/21/20   Esterwood, Amy S, PA-C  flecainide (TAMBOCOR) 50 MG tablet Take 1 tablet (50 mg total) by mouth daily as needed. For fast heart rate.  08/04/20   Burnell Blanks, MD  hyoscyamine (LEVSIN) 0.125 MG tablet Take 1 tablet (0.125 mg total) by mouth every 4 (four) hours as needed for up to 10 days (chest spasms). 11/11/20 11/21/20  Plotnikov, [name], MD  linaclotide Rolan Lipa) 72 MCG capsule Take 1 capsule (72 mcg total) by mouth daily before breakfast. 03/09/20   Plotnikov, [name], MD  loratadine (CLARITIN) 10 MG tablet Take 1 tablet (10 mg total) by mouth daily. 07/20/19   Plotnikov, [name], MD  mometasone (NASONEX) 50 MCG/ACT nasal spray 2 sprays in each nostril 07/11/20   Plotnikov, [name], MD  tretinoin (RETIN-A) 0.05 % cream Apply 1 application topically at bedtime.    [provider]  UNABLE TO FIND once a week. Med Name: allergy shots    [provider]  valACYclovir (VALTREX) 500 MG tablet Take 1 tablet (500 mg total) by mouth 2 (two) times daily. 07/11/20   Plotnikov, [name], MD    Family History Family History  Problem Relation Age of Onset   Hyperlipidemia Father    COPD Father        Smoker, deceased 06/12/2014   Hypertension Father     Hyperlipidemia Mother    Hypertension Mother    Headache Sister    Diabetes Neg Hx    Colon cancer Neg Hx    Breast cancer Neg Hx    Coronary artery disease Neg Hx    Esophageal cancer Neg Hx    Stomach cancer Neg Hx    Pancreatic cancer Neg Hx    Liver disease Neg Hx     Social History Social History   Tobacco Use   Smoking status: Never   Smokeless tobacco: Never  Vaping Use   Vaping Use: Never used  Substance Use Topics   Alcohol use: No   Drug use: No     Allergies   Codeine, Doxycycline, Epinephrine, Neomycin, Penicillins, Protonix [pantoprazole sodium], Sulfonamide derivatives, and Zithromax [azithromycin]   Review of Systems Review of Systems  Skin:  Positive for rash.  All other systems reviewed and are negative.   Physical Exam Triage Vital Signs ED Triage Vitals  Enc Vitals Group     BP      Pulse      Resp      Temp      Temp src      SpO2      Weight      Height      Head Circumference      Peak Flow      Pain Score      Pain Loc      Pain Edu?      Excl. in [place]?    No data found.  Updated Vital Signs BP (!) 155/80 (BP Location: Right Arm)   Pulse 84   Temp 98.7 F (37.1 C) (Oral)   Resp 16   SpO2 96%   Visual Acuity Right Eye Distance:   Left Eye Distance:   Bilateral Distance:    Right Eye Near:   Left Eye Near:    Bilateral Near:     Physical Exam Vitals reviewed.  Constitutional:      Appearance: Normal appearance.  HENT:     Head: Normocephalic and atraumatic.  Pulmonary:     Effort: Pulmonary effort is normal.  Skin:    Comments: Middle of lower lip with two small vesicles on erythematous base with crusting. Nontender. No lesions inside mouth.  Neurological:  General: No focal deficit present.     Mental Status: She is alert and oriented to person, place, and time.  Psychiatric:        Mood and Affect: Mood normal.        Behavior: Behavior normal.        Thought Content: Thought content normal.         Judgment: Judgment normal.     UC Treatments / Results  Labs (all labs ordered are listed, but only abnormal results are displayed) Labs Reviewed - No data to display  EKG   Radiology No results found.  Procedures Procedures (including critical care time)  Medications Ordered in UC Medications - No data to display  Initial Impression / Assessment and Plan / UC Course  I have reviewed the triage vital signs and the nursing notes.  Pertinent labs & imaging results that were available during my care of the patient were reviewed by me and considered in my medical decision making (see chart for details).     This patient is a very pleasant 70 y.o. year old female presenting to confirm that she has a herpes simplex outbreak.  I confirmed that she does indeed have a cold sore on her lower lip.  She already has prescription of Valtrex as prescribed by PCP, continue this.  Also sent acyclovir at patient request.  Follow-up with PCP as scheduled in few days.   Final Clinical Impressions(s) / UC Diagnoses   Final diagnoses:  Herpes simplex     Discharge Instructions      -Complete course of valacyclovir -You can try the acyclovir cream, applied to outer lip 5x daily x4 days.  -Follow-up with PCP as scheduled next week.     ED Prescriptions     Medication Sig Dispense Auth. Provider   acyclovir cream (ZOVIRAX) 5 % Apply 1 application topically 5 (five) times daily for 4 days. 15 g Hazel Sams, PA-C      PDMP not reviewed this encounter.   Hazel Sams, PA-C 01/01/21 1118

## 2021-01-03 DIAGNOSIS — B009 Herpesviral infection, unspecified: Secondary | ICD-10-CM | POA: Diagnosis not present

## 2021-01-05 ENCOUNTER — Ambulatory Visit (INDEPENDENT_AMBULATORY_CARE_PROVIDER_SITE_OTHER): Payer: Medicare Other | Admitting: Internal Medicine

## 2021-01-05 ENCOUNTER — Encounter: Payer: Self-pay | Admitting: Internal Medicine

## 2021-01-05 ENCOUNTER — Other Ambulatory Visit: Payer: Self-pay

## 2021-01-05 ENCOUNTER — Ambulatory Visit: Payer: Medicare Other

## 2021-01-05 VITALS — BP 138/82 | HR 80 | Temp 98.5°F | Ht 62.0 in | Wt 101.6 lb

## 2021-01-05 DIAGNOSIS — R922 Inconclusive mammogram: Secondary | ICD-10-CM

## 2021-01-05 DIAGNOSIS — Z0000 Encounter for general adult medical examination without abnormal findings: Secondary | ICD-10-CM

## 2021-01-05 DIAGNOSIS — F411 Generalized anxiety disorder: Secondary | ICD-10-CM | POA: Diagnosis not present

## 2021-01-05 MED ORDER — ALPRAZOLAM 0.25 MG PO TABS: ORAL_TABLET | ORAL | 1 refills | Status: DC

## 2021-01-05 NOTE — Progress Notes (Signed)
Subjective:  Patient ID: [name], female    DOB: 1950-10-03  Age: 70 y.o. MRN: [id number]  CC: Annual Exam   HPI [name] presents for a well exam  Outpatient Medications Prior to Visit  Medication Sig Dispense Refill   acyclovir cream (ZOVIRAX) 5 % Apply 1 application topically 5 (five) times daily for 4 days. 15 g 0   ALPRAZolam (XANAX) 0.25 MG tablet 0.5-1 po bid prn anxiety 30 tablet 1   Botulinum Toxin Type A, Cosm, 50 units SOLR Inject 4-6 Units into the muscle. 3 month  injection     Cholecalciferol (VITAMIN D3 PO) Take 50 mcg by mouth daily.     Cyanocobalamin (VITAMIN B-12) 1000 MCG SUBL PLACE 1 TABLET (1,000 MCG TOTAL) UNDER THE TONGUE DAILY. 100 tablet 3   DEXILANT 60 MG capsule Take 1 capsule (60 mg total) by mouth daily. 90 capsule 3   diltiazem (CARDIZEM) 30 MG tablet Take 1 tablet (30 mg total) by mouth daily as needed. 30 tablet 1   diltiazem (TIAZAC) 180 MG 24 hr capsule      EPIPEN 2-PAK 0.3 MG/0.3ML SOAJ injection See admin instructions. Reported on 10/08/2015  1   EUCRISA 2 % OINT Apply topically. Apply 1 to 2 times a day     famotidine (PEPCID) 40 MG tablet Take 1 tablet 1 hour before bed 30 tablet 2   flecainide (TAMBOCOR) 50 MG tablet Take 1 tablet (50 mg total) by mouth daily as needed. For fast heart rate. 90 tablet 3   linaclotide (LINZESS) 72 MCG capsule Take 1 capsule (72 mcg total) by mouth daily before breakfast. 90 capsule 3   loratadine (CLARITIN) 10 MG tablet Take 1 tablet (10 mg total) by mouth daily. 90 tablet 3   mometasone (NASONEX) 50 MCG/ACT nasal spray 2 sprays in each nostril 3 each 3   tretinoin (RETIN-A) 0.05 % cream Apply 1 application topically at bedtime.     UNABLE TO FIND once a week. Med Name: allergy shots     valACYclovir (VALTREX) 500 MG tablet Take 1 tablet (500 mg total) by mouth 2 (two) times daily. 10 tablet 2   hyoscyamine (LEVSIN) 0.125 MG tablet Take 1 tablet (0.125 mg total) by mouth every 4 (four) hours as  needed for up to 10 days (chest spasms). 100 tablet 3   No facility-administered medications prior to visit.    ROS: Review of Systems  Constitutional:  Negative for activity change, appetite change, chills, fatigue and unexpected weight change.  HENT:  Negative for congestion, mouth sores and sinus pressure.   Eyes:  Negative for visual disturbance.  Respiratory:  Negative for cough and chest tightness.   Gastrointestinal:  Negative for abdominal pain and nausea.  Genitourinary:  Negative for difficulty urinating, frequency and vaginal pain.  Musculoskeletal:  Negative for back pain and gait problem.  Skin:  Negative for pallor and rash.  Neurological:  Negative for dizziness, tremors, weakness, numbness and headaches.  Psychiatric/Behavioral:  Negative for confusion, sleep disturbance and suicidal ideas.    Objective:  BP 138/82 (BP Location: Left Arm)   Pulse 80   Temp 98.5 F (36.9 C) (Oral)   Ht 5\' 2"  (1.575 m)   Wt 101 lb 9.6 oz (46.1 kg)   SpO2 96%   BMI 18.58 kg/m   BP Readings from Last 3 Encounters:  01/05/21 138/82  01/01/21 (!) 155/80  12/21/20 130/70    Wt Readings from Last 3 Encounters:  01/05/21 101 lb  9.6 oz (46.1 kg)  12/21/20 100 lb (45.4 kg)  11/21/20 101 lb 12.8 oz (46.2 kg)    Physical Exam Constitutional:      General: She is not in acute distress.    Appearance: She is well-developed.  HENT:     Head: Normocephalic.     Right Ear: External ear normal.     Left Ear: External ear normal.     Nose: Nose normal.  Eyes:     General:        Right eye: No discharge.        Left eye: No discharge.     Conjunctiva/sclera: Conjunctivae normal.     Pupils: Pupils are equal, round, and reactive to light.  Neck:     Thyroid: No thyromegaly.     Vascular: No JVD.     Trachea: No tracheal deviation.  Cardiovascular:     Rate and Rhythm: Normal rate and regular rhythm.     Heart sounds: Normal heart sounds.  Pulmonary:     Effort: No respiratory  distress.     Breath sounds: No stridor. No wheezing.  Abdominal:     General: Bowel sounds are normal. There is no distension.     Palpations: Abdomen is soft. There is no mass.     Tenderness: There is no abdominal tenderness. There is no guarding or rebound.  Musculoskeletal:        General: No tenderness.     Cervical back: Normal [name] of motion and neck supple. No rigidity.  Lymphadenopathy:     Cervical: No cervical adenopathy.  Skin:    Findings: No erythema or rash.  Neurological:     Cranial Nerves: No cranial nerve deficit.     Motor: No abnormal muscle tone.     Coordination: Coordination normal.     Deep Tendon Reflexes: Reflexes normal.  Psychiatric:        Behavior: Behavior normal.        Thought Content: Thought content normal.        Judgment: Judgment normal.    Lab Results  Component Value Date   WBC 4.5 12/16/2020   HGB 12.9 12/16/2020   HCT 38.1 12/16/2020   PLT 170.0 12/16/2020   GLUCOSE 81 12/16/2020   CHOL 192 12/16/2020   TRIG 52.0 12/16/2020   HDL 74.30 12/16/2020   LDLCALC 107 (H) 12/16/2020   ALT 15 12/16/2020   AST 18 12/16/2020   NA 141 12/16/2020   K 4.6 12/16/2020   CL 104 12/16/2020   CREATININE 0.87 12/16/2020   BUN 23 12/16/2020   CO2 30 12/16/2020   TSH 1.61 12/16/2020    No results found.  Assessment & Plan:    [name], MD

## 2021-01-05 NOTE — Assessment & Plan Note (Signed)
We discussed age appropriate health related issues, including available/recomended screening tests and vaccinations. We discussed a need for adhering to healthy diet and exercise. Labs/EKG were reviewed/ordered. All questions were answered. Mammo q 12 mo Eye appt q 12 mo

## 2021-01-05 NOTE — Patient Instructions (Signed)
Novovax

## 2021-01-06 DIAGNOSIS — J3089 Other allergic rhinitis: Secondary | ICD-10-CM | POA: Diagnosis not present

## 2021-01-08 NOTE — Assessment & Plan Note (Signed)
Xanax prn  Potential benefits of a long term opioids use as well as potential risks (i.e. addiction risk, apnea etc) and complications (i.e. Somnolence, constipation and others) were explained to the patient and were aknowledged.

## 2021-01-09 ENCOUNTER — Ambulatory Visit (INDEPENDENT_AMBULATORY_CARE_PROVIDER_SITE_OTHER): Payer: Medicare Other

## 2021-01-09 ENCOUNTER — Other Ambulatory Visit: Payer: Self-pay

## 2021-01-09 VITALS — BP 138/82 | Ht 62.0 in | Wt 101.6 lb

## 2021-01-09 DIAGNOSIS — Z0000 Encounter for general adult medical examination without abnormal findings: Secondary | ICD-10-CM

## 2021-01-09 DIAGNOSIS — J3089 Other allergic rhinitis: Secondary | ICD-10-CM | POA: Diagnosis not present

## 2021-01-09 DIAGNOSIS — Z13820 Encounter for screening for osteoporosis: Secondary | ICD-10-CM

## 2021-01-09 NOTE — Progress Notes (Addendum)
Subjective:   [name] is a 70 y.o. female who presents for an Initial Medicare Annual Wellness Visit.  Review of Systems     Cardiac Risk Factors include: advanced age (>33mn, >>68women);dyslipidemia;family history of premature cardiovascular disease;hypertension     Objective:    Today's Vitals   01/09/21 1329  BP: 138/82  Weight: 101 lb 10.1 oz (46.1 kg)  Height: '5\' 2"'$  (1.575 m)  PainSc: 0-No pain   Body mass index is 18.59 kg/m.  Advanced Directives 01/09/2021 09/12/2016 07/06/2015 04/18/2015 08/22/2014 05/07/2014  Does Patient Have a Medical Advance Directive? Yes Yes Yes No No Yes  Type of Advance Directive Living will;Healthcare Power of Attorney Living will [place] will - - Living will;Healthcare Power of Attorney  Does patient want to make changes to medical advance directive? No - Patient declined No - Patient declined No - Patient declined - - -  [place] of [place]? No - copy requested - No - copy requested - - -    Current Medications (verified) Outpatient Encounter Medications as of 01/09/2021  Medication Sig   ALPRAZolam (XANAX) 0.25 MG tablet 0.5-1 po bid prn anxiety   Botulinum Toxin Type A, Cosm, 50 units SOLR Inject 4-6 Units into the muscle. 3 month  injection   Cholecalciferol (VITAMIN D3 PO) Take 50 mcg by mouth daily.   Cyanocobalamin (VITAMIN B-12) 1000 MCG SUBL PLACE 1 TABLET (1,000 MCG TOTAL) UNDER THE TONGUE DAILY.   DEXILANT 60 MG capsule Take 1 capsule (60 mg total) by mouth daily.   diltiazem (CARDIZEM) 30 MG tablet Take 1 tablet (30 mg total) by mouth daily as needed.   diltiazem (TIAZAC) 180 MG 24 hr capsule    EPIPEN 2-PAK 0.3 MG/0.3ML SOAJ injection See admin instructions. Reported on 10/08/2015   EUCRISA 2 % OINT Apply topically. Apply 1 to 2 times a day   famotidine (PEPCID) 40 MG tablet Take 1 tablet 1 hour before bed   flecainide (TAMBOCOR) 50 MG tablet Take 1 tablet (50 mg  total) by mouth daily as needed. For fast heart rate.   hyoscyamine (LEVSIN) 0.125 MG tablet Take 1 tablet (0.125 mg total) by mouth every 4 (four) hours as needed for up to 10 days (chest spasms).   linaclotide (LINZESS) 72 MCG capsule Take 1 capsule (72 mcg total) by mouth daily before breakfast.   loratadine (CLARITIN) 10 MG tablet Take 1 tablet (10 mg total) by mouth daily.   mometasone (NASONEX) 50 MCG/ACT nasal spray 2 sprays in each nostril   tretinoin (RETIN-A) 0.05 % cream Apply 1 application topically at bedtime.   UNABLE TO FIND once a week. Med Name: allergy shots   valACYclovir (VALTREX) 500 MG tablet Take 1 tablet (500 mg total) by mouth 2 (two) times daily.   No facility-administered encounter medications on file as of 01/09/2021.    Allergies (verified) Codeine, Doxycycline, Epinephrine, Neomycin, Penicillins, Protonix [pantoprazole sodium], Sulfonamide derivatives, and Zithromax [azithromycin]   History: Past Medical History:  Diagnosis Date   Allergic rhinitis, cause unspecified    Backache, unspecified    Benign fasciculation-cramp syndrome 12/03/2012    Worsening with  stress, fatigue .   Calculus of kidney    Conversion disorder    Cotton wool spots    Esophageal reflux    Floater, vitreous, left 05/2016   GERD (gastroesophageal reflux disease)    Hemorrhage of rectum and anus    Internal hemorrhoids without mention of complication  Irritable bowel syndrome    Microscopic hematuria    Migraine headache with aura    Mild aortic insufficiency    Mild tricuspid regurgitation    Osteopenia    Other plastic surgery for unacceptable cosmetic appearance    Inj tx fllers/expander   Personal history of urinary calculi    Postmenopausal atrophic vaginitis    Premature atrial contractions    PVC (premature ventricular contraction)    a. Event monitor 2013.   Unspecified constipation    Vasovagal syncope    Past Surgical History:  Procedure Laterality Date    BREAST BIOPSY Right    Cosmetic Procedures w/Injection therapy     SKIN CANCER EXCISION Left    thigh   WISDOM TOOTH EXTRACTION     Family History  Problem Relation Age of Onset   Hyperlipidemia Father    COPD Father        Smoker, deceased 07-01-2014   Hypertension Father    Hyperlipidemia Mother    Hypertension Mother    Headache Sister    Diabetes Neg Hx    Colon cancer Neg Hx    Breast cancer Neg Hx    Coronary artery disease Neg Hx    Esophageal cancer Neg Hx    Stomach cancer Neg Hx    Pancreatic cancer Neg Hx    Liver disease Neg Hx    Social History   Socioeconomic History   Marital status: Married    Spouse name: [name]   Number of children: 0   Years of education: 14   Highest education level: Not on file  Occupational History   Occupation: OFFICE liasion    Employer: [employer]   Occupation: Real Data processing manager   Occupation: OFFICE LEAZON    Employer: [employer]  Tobacco Use   Smoking status: Never   Smokeless tobacco: Never  Vaping Use   Vaping Use: Never used  Substance and Sexual Activity   Alcohol use: No   Drug use: No   Sexual activity: Not Currently  Other Topics Concern   Not on file  Social History Narrative   Vocational School in [place]. Married '90 - marriage is in very good shape '12, No children.  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 Offerred medical services for them if needed (Nov '11)   Patient is married [name]) and lives at home with her husband.   Patient is working Administrator, arts work.   Patient has a high school education.   Patient is right-handed.   Patient does not drink any caffeine.      Social Determinants of Health   Financial Resource Strain: Low Risk    Difficulty of Paying Living Expenses: Not hard at all  Food Insecurity: No Food Insecurity   Worried About Charity fundraiser in the Last Year: Never true    [place] in the Last Year: Never true  Transportation Needs: No Transportation Needs   Lack of Transportation (Medical): No   Lack of Transportation (Non-Medical): No  Physical Activity: Sufficiently Active   Days of Exercise per Week: 6 days   Minutes of Exercise per Session: 60 min  Stress: No Stress Concern Present   Feeling of Stress : Not at all  Social Connections: Socially Integrated   Frequency of Communication with Friends and Family: More than three times a week   Frequency of Social  Gatherings with Friends and Family: Once a week   Attends Religious Services: More than 4 times per year   Active Member of [employer] or Organizations: No   Attends Music therapist: More than 4 times per year   Marital Status: Married    Tobacco Counseling Counseling given: Not Answered   Clinical Intake:  Pre-visit preparation completed: Yes  Pain : No/denies pain Pain Score: 0-No pain     BMI - recorded: 18.59 Nutritional Status: BMI <19  Underweight Nutritional Risks: None Diabetes: No  How often do you need to have someone help you when you read instructions, pamphlets, or other written materials from your doctor or pharmacy?: 1 - Never What is the last grade level you completed in school?: HSG; [employer]  Diabetic? no  Interpreter Needed?: No  Information entered by :: Lisette Abu, LPN   Activities of Daily Living In your present state of health, do you have any difficulty performing the following activities: 01/09/2021 07/11/2020  Hearing? N N  Vision? N N  Difficulty concentrating or making decisions? N N  Walking or climbing stairs? N N  Dressing or bathing? N N  Doing errands, shopping? N N  Preparing Food and eating ? N -  Using the Toilet? N -  In the past six months, have you accidently leaked urine? N -  Do you have problems with loss of bowel control? N -  Managing your Medications? N -  Managing your Finances? N -   Housekeeping or managing your Housekeeping? N -  Some recent data might be hidden    Patient Care Team: Plotnikov, [name], MD as PCP - General (Internal Medicine) Burnell Blanks, MD as PCP - Cardiology (Cardiology) Constance Haw, MD as PCP - Electrophysiology (Cardiology) Clent Jacks, MD as Consulting Physician (Ophthalmology) Dohmeier, Asencion Partridge, MD as Consulting Physician (Neurology) Irene Shipper, MD as Consulting Physician (Gastroenterology) Macarthur Critchley, OD as Referring Physician (Optometry)  Indicate any recent Medical Services you may have received from other than [facility] providers in the past year (date may be approximate).     Assessment:   This is a routine wellness examination for [place].  Hearing/Vision screen Hearing Screening - Comments:: Patient declined any hearing difficulty. Vision Screening - Comments:: Patient wear contacts.  Eye exams done by Dr. Warden Fillers &amp; Dr. Ilona Sorrel.  Dietary issues and exercise activities discussed: Current Exercise Habits: Structured exercise class, Type of exercise: walking;Other - see comments (personal trainer), Time (Minutes): 60, Frequency (Times/Week): 6, Weekly Exercise (Minutes/Week): 360, Intensity: Moderate, Exercise limited by: None identified   Goals Addressed               This Visit's Progress     Patient Stated (pt-stated)        My goal is to continue to physically train at my level and increase my body weight.      Depression Screen PHQ 2/9 Scores 01/09/2021 07/11/2020 09/17/2019 09/23/2017 09/12/2016  PHQ - 2 Score 0 0 0 0 0  PHQ- 9 Score - - - 0 -  Exception Documentation - - - - Patient refusal    Fall Risk Fall Risk  01/09/2021 04/11/2020 01/19/2020 01/13/2019 10/31/2017  Falls in the past year? 0 0 0 0 No  Number falls in past yr: 0 - 0 0 -  Injury with Fall? 0 - 0 0 -  Risk for fall due to : No Fall Risks - - - -  Follow up  Falls evaluation completed - - - -    FALL RISK  PREVENTION PERTAINING TO THE HOME:  Any stairs in or around the home? Yes  If so, are there any without handrails? No  Home free of loose throw rugs in walkways, pet beds, electrical cords, etc? Yes  Adequate lighting in your home to reduce risk of falls? Yes   ASSISTIVE DEVICES UTILIZED TO PREVENT FALLS:  Life alert? No  Use of a cane, walker or w/c? No  Grab bars in the bathroom? No  Shower chair or bench in shower? No  Elevated toilet seat or a handicapped toilet? No   TIMED UP AND GO:  Was the test performed? Yes .  Length of time to ambulate 10 feet: 5 sec.   Gait steady and fast without use of assistive device  Cognitive Function: Normal cognitive status assessed by direct observation by this Nurse Health Advisor. No abnormalities found.          Immunizations Immunization History  Administered Date(s) Administered   Fluad Quad(high Dose 65+) 04/01/2019, 04/06/2020   Influenza Split 03/29/2011, 04/08/2012   Influenza Whole 04/09/2008, 04/12/2009, 04/10/2010   Influenza, High Dose Seasonal PF 05/06/2017, 04/14/2018, 04/16/2019, 04/21/2020   Influenza,inj,Quad PF,6+ Mos 04/08/2013, 03/22/2014, 04/08/2015, 04/09/2016, 04/08/2017   Moderna Sars-Covid-2 Vaccination 07/17/2019, 08/17/2019   Pneumococcal Conjugate-13 05/21/2018   Pneumococcal Polysaccharide-23 05/25/2019, 04/21/2020   Tdap 09/28/2015    TDAP status: Up to date  Flu Vaccine status: Up to date  Pneumococcal vaccine status: Up to date  Covid-19 vaccine status: Completed vaccines  Qualifies for Shingles Vaccine? Yes   Zostavax completed No   Shingrix Completed?: No.    Education has been provided regarding the importance of this vaccine. Patient has been advised to call insurance company to determine out of pocket expense if they have not yet received this vaccine. Advised may also receive vaccine at local pharmacy or Health Dept. Verbalized acceptance and understanding.  Screening Tests Health  Maintenance  Topic Date Due   Zoster Vaccines- Shingrix (1 of 2) Never done   COVID-19 Vaccine (3 - Moderna risk series) 09/14/2019   INFLUENZA VACCINE  01/16/2021   MAMMOGRAM  03/18/2021   COLONOSCOPY (Pts 45-58yr Insurance coverage will need to be confirmed)  06/05/2023   TETANUS/TDAP  09/27/2025   DEXA SCAN  Completed   Hepatitis C Screening  Completed   PNA vac Low Risk Adult  Completed   HPV VACCINES  Aged Out    Health Maintenance  Health Maintenance Due  Topic Date Due   Zoster Vaccines- Shingrix (1 of 2) Never done   COVID-19 Vaccine (3 - Moderna risk series) 09/14/2019    Colorectal cancer screening: Type of screening: Colonoscopy. Completed 06/04/2013. Repeat every 10 years  Mammogram status: Ordered 01/08/2021. Pt provided with contact info and advised to call to schedule appt.   Bone Density status: Ordered 01/09/2021. Pt provided with contact info and advised to call to schedule appt.  Lung Cancer Screening: (Low Dose CT Chest recommended if Age 351-80years, 30 pack-year currently smoking OR have quit w/in 15years.) does not qualify.   Lung Cancer Screening Referral: no  Additional Screening:  Hepatitis C Screening: does qualify; Completed yes  Vision Screening: Recommended annual ophthalmology exams for early detection of glaucoma and other disorders of the eye. Is the patient up to date with their annual eye exam?  Yes  Who is the provider or what is the name of the office in which the patient attends  annual eye exams? Warden Fillers, MD and Ilona Sorrel, OD If pt is not established with a provider, would they like to be referred to a provider to establish care? No .   Dental Screening: Recommended annual dental exams for proper oral hygiene  Community Resource Referral / Chronic Care Management: CRR required this visit?  No   CCM required this visit?  No      Plan:     I have personally reviewed and noted the following in the patient's chart:    Medical and social history Use of alcohol, tobacco or illicit drugs  Current medications and supplements including opioid prescriptions. Patient is not currently taking opioid prescriptions. Functional ability and status Nutritional status Physical activity Advanced directives List of other physicians Hospitalizations, surgeries, and ER visits in previous 12 months Vitals Screenings to include cognitive, depression, and falls Referrals and appointments  In addition, I have reviewed and discussed with patient certain preventive protocols, quality metrics, and best practice recommendations. A written personalized care plan for preventive services as well as general preventive health recommendations were provided to patient.     Sheral Flow, LPN   [id number]   Nurse Notes: n/a   Medical screening examination/treatment/procedure(s) were performed by non-physician practitioner and as supervising physician I was immediately available for consultation/collaboration.  I agree with above. Lew Dawes, MD

## 2021-01-09 NOTE — Patient Instructions (Signed)
[name] , Thank you for taking time to come for your Medicare Wellness Visit. I appreciate your ongoing commitment to your health goals. Please review the following plan we discussed and let me know if I can assist you in the future.   Screening recommendations/referrals: Colonoscopy: 06/04/2013; due every 10 years Mammogram: 03/23/2020; order placed 01/08/2021 Bone Density: 01/13/2019; order placed 01/09/2021 Recommended yearly ophthalmology/optometry visit for glaucoma screening and checkup Recommended yearly dental visit for hygiene and checkup  Vaccinations: Influenza vaccine: 04/21/2020 Pneumococcal vaccine: 05/21/2018, 04/21/2020 Tdap vaccine: 09/28/2015; due every 10 years Shingles vaccine: never done   Covid-19: 07/17/2019, 08/17/2019  Advanced directives: Please bring a copy of your health care power of attorney and living will to the office at your convenience.  Conditions/risks identified: Client understands the importance of follow-up with providers by attending scheduled visits and discussed goals to eat healthier, increase physical activity, exercise the brain, socialize more,  get enough rest and make time for laughter.  Next appointment: Please schedule your next Medicare Wellness Visit with your Nurse Health Advisor in 1 year by calling [phone].   Preventive Care 68 Years and Older, Female Preventive care refers to lifestyle choices and visits with your health care provider that can promote health and wellness. What does preventive care include? A yearly physical exam. This is also called an annual well check. Dental exams once or twice a year. Routine eye exams. Ask your health care provider how often you should have your eyes checked. Personal lifestyle choices, including: Daily care of your teeth and gums. Regular physical activity. Eating a healthy diet. Avoiding tobacco and drug use. Limiting alcohol use. Practicing safe sex. Taking low-dose aspirin every  day. Taking vitamin and mineral supplements as recommended by your health care provider. What happens during an annual well check? The services and screenings done by your health care provider during your annual well check will depend on your age, overall health, lifestyle risk factors, and family history of disease. Counseling  Your health care provider may ask you questions about your: Alcohol use. Tobacco use. Drug use. Emotional well-being. Home and relationship well-being. Sexual activity. Eating habits. History of falls. Memory and ability to understand (cognition). Work and work Statistician. Reproductive health. Screening  You may have the following tests or measurements: Height, weight, and BMI. Blood pressure. Lipid and cholesterol levels. These may be checked every 5 years, or more frequently if you are over 65 years old. Skin check. Lung cancer screening. You may have this screening every year starting at age 63 if you have a 30-pack-year history of smoking and currently smoke or have quit within the past 15 years. Fecal occult blood test (FOBT) of the stool. You may have this test every year starting at age 53. Flexible sigmoidoscopy or colonoscopy. You may have a sigmoidoscopy every 5 years or a colonoscopy every 10 years starting at age 76. Hepatitis C blood test. Hepatitis B blood test. Sexually transmitted disease (STD) testing. Diabetes screening. This is done by checking your blood sugar (glucose) after you have not eaten for a while (fasting). You may have this done every 1-3 years. Bone density scan. This is done to screen for osteoporosis. You may have this done starting at age 64. Mammogram. This may be done every 1-2 years. Talk to your health care provider about how often you should have regular mammograms. Talk with your health care provider about your test results, treatment options, and if necessary, the need for more tests. Vaccines  Your health care  provider may recommend certain vaccines, such as: Influenza vaccine. This is recommended every year. Tetanus, diphtheria, and acellular pertussis (Tdap, Td) vaccine. You may need a Td booster every 10 years. Zoster vaccine. You may need this after age 25. Pneumococcal 13-valent conjugate (PCV13) vaccine. One dose is recommended after age 29. Pneumococcal polysaccharide (PPSV23) vaccine. One dose is recommended after age 2. Talk to your health care provider about which screenings and vaccines you need and how often you need them. This information is not intended to replace advice given to you by your health care provider. Make sure you discuss any questions you have with your health care provider. Document Released: 07/01/2015 Document Revised: 02/22/2016 Document Reviewed: 04/05/2015 Elsevier Interactive Patient Education  2017 [place] Prevention in the Home Falls can cause injuries. They can happen to people of all ages. There are many things you can do to make your home safe and to help prevent falls. What can I do on the outside of my home? Regularly fix the edges of walkways and driveways and fix any cracks. Remove anything that might make you trip as you walk through a door, such as a raised step or threshold. Trim any bushes or trees on the path to your home. Use bright outdoor lighting. Clear any walking paths of anything that might make someone trip, such as rocks or tools. Regularly check to see if handrails are loose or broken. Make sure that both sides of any steps have handrails. Any raised decks and porches should have guardrails on the edges. Have any leaves, snow, or ice cleared regularly. Use sand or salt on walking paths during winter. Clean up any spills in your garage right away. This includes oil or grease spills. What can I do in the bathroom? Use night lights. Install grab bars by the toilet and in the tub and shower. Do not use towel bars as grab  bars. Use non-skid mats or decals in the tub or shower. If you need to sit down in the shower, use a plastic, non-slip stool. Keep the floor dry. Clean up any water that spills on the floor as soon as it happens. Remove soap buildup in the tub or shower regularly. Attach bath mats securely with double-sided non-slip rug tape. Do not have throw rugs and other things on the floor that can make you trip. What can I do in the bedroom? Use night lights. Make sure that you have a light by your bed that is easy to reach. Do not use any sheets or blankets that are too big for your bed. They should not hang down onto the floor. Have a firm chair that has side arms. You can use this for support while you get dressed. Do not have throw rugs and other things on the floor that can make you trip. What can I do in the kitchen? Clean up any spills right away. Avoid walking on wet floors. Keep items that you use a lot in easy-to-reach places. If you need to reach something above you, use a strong step stool that has a grab bar. Keep electrical cords out of the way. Do not use floor polish or wax that makes floors slippery. If you must use wax, use non-skid floor wax. Do not have throw rugs and other things on the floor that can make you trip. What can I do with my stairs? Do not leave any items on the stairs. Make sure that there are  handrails on both sides of the stairs and use them. Fix handrails that are broken or loose. Make sure that handrails are as long as the stairways. Check any carpeting to make sure that it is firmly attached to the stairs. Fix any carpet that is loose or worn. Avoid having throw rugs at the top or bottom of the stairs. If you do have throw rugs, attach them to the floor with carpet tape. Make sure that you have a light switch at the top of the stairs and the bottom of the stairs. If you do not have them, ask someone to add them for you. What else can I do to help prevent  falls? Wear shoes that: Do not have high heels. Have rubber bottoms. Are comfortable and fit you well. Are closed at the toe. Do not wear sandals. If you use a stepladder: Make sure that it is fully opened. Do not climb a closed stepladder. Make sure that both sides of the stepladder are locked into place. Ask someone to hold it for you, if possible. Clearly mark and make sure that you can see: Any grab bars or handrails. First and last steps. Where the edge of each step is. Use tools that help you move around (mobility aids) if they are needed. These include: Canes. Walkers. Scooters. Crutches. Turn on the lights when you go into a dark area. Replace any light bulbs as soon as they burn out. Set up your furniture so you have a clear path. Avoid moving your furniture around. If any of your floors are uneven, fix them. If there are any pets around you, be aware of where they are. Review your medicines with your doctor. Some medicines can make you feel dizzy. This can increase your chance of falling. Ask your doctor what other things that you can do to help prevent falls. This information is not intended to replace advice given to you by your health care provider. Make sure you discuss any questions you have with your health care provider. Document Released: 03/31/2009 Document Revised: 11/10/2015 Document Reviewed: 07/09/2014 Elsevier Interactive Patient Education  2017 [employer].

## 2021-01-16 ENCOUNTER — Ambulatory Visit (INDEPENDENT_AMBULATORY_CARE_PROVIDER_SITE_OTHER): Payer: Medicare Other | Admitting: Family Medicine

## 2021-01-16 ENCOUNTER — Encounter: Payer: Self-pay | Admitting: Family Medicine

## 2021-01-16 ENCOUNTER — Other Ambulatory Visit: Payer: Self-pay

## 2021-01-16 DIAGNOSIS — R627 Adult failure to thrive: Secondary | ICD-10-CM | POA: Diagnosis not present

## 2021-01-16 DIAGNOSIS — E639 Nutritional deficiency, unspecified: Secondary | ICD-10-CM | POA: Diagnosis not present

## 2021-01-16 NOTE — Patient Instructions (Addendum)
To build muscle, you have to provide both amino acids (building blocks) and carb (energy) to synthesize protein.  This is especially important right after working out, when your muscle protein synthesis has been stimulated by exercise.   It makes good sense to include carb at breakfast especially b/c you have fasted overnight.  It makes sense to have at least 1/3 cup (or up to 1/2 cup) dry steel-cut oats per serving, along with the fruit and egg whites you usually have.    If you want to experiment with a collagen-based protein supplement, try 2 tbsp (1 oz) of Pro-Stat in 10 oz WF 365 lemonade, diluted with water up to 20 oz.    Fresh or frozen veg's are WAY better than canned in terms of nutrition.    Reasonable goals for you:  2000 calories per day BUT I don't recommend counting calories.   At least 20 grams of protein per meal.  (Each one ounce of meat, fish, poultry, hard cheese, and 1 egg = 7 grams.  1 cup of most beans = ~15 grams.  8 oz milk = 8 grams.    General recommendations:  1. Drink at least 48 oz of water.  Before you go to bed, pour 12 oz of water, and place this in the bathroom or bedroom - drink it first thing in the morning.  Aim for at least 24 oz consumed by lunch time.   2. For both lunch and dinner, as often as possible, get twice the volume of veg's vs. either protein or starch foods.  (Include some starchy foods!)   Sometimes in place of veg's, you can include fruit at those meals.    Suggestion for bone health: [facility] on [employer].  Ask for [facility].  Also recommended is 250 mg calcium citrate 1-2 X day.  You can get this dosage at [facility] on [address]    Follow-up appt in the office on Mon, Sept 19 at 11 AM.

## 2021-01-16 NOTE — Progress Notes (Addendum)
Medical Nutrition Therapy PCP Lew Dawes, MD  Patient has completed 2nd dose of COVID-19 vaccine 07/17/19. Appt start time: 1100 end time: 1145 (45 minutes) Primary concerns today:  Wants to gain weight, but with a healthy diet, referred by her PCP Lew Dawes, MD .  Relevant history/background: Asheley has worked out regularly since her 20's, and did body building and figure competition from her late 70's to early 49's.  She used a keto diet much of this time.  She is now concerned she has lost too much weight, and wants to optimize her diet and health.  She wants to know how many kcal she should eat.  A test for gluten intolerance was negative.  She suspects she has a lactase deficiency, but has not yet been tested.  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  LDL tends to run a bit high (although HDL on 12/16/20 was 74.3), so she limits total and saturated fat.  Diagnoses also include osteopenia.    Assessment: Honora wants to gain weight, and to maintain or build muscle.  Usual routine is breakfast, followed by workout, followed by protein shake.    Learning Readiness: Ready  Usual eating pattern: 3 meals and 2 snacks per day. Frequent foods and beverages: water, sparkling water; egg whites, steel-cut oatmeal, chx, lean ground beef, pork chops.   Avoided foods: most beans, dairy, many bread items, sweets, fried foods.   Usual physical activity: Works out 6 days a week, which includes 30-45 min cardio (HR 113-120) and 20-30 min resistance ex, usually 3 sets of 12-15 reps of moderate intensity. Sleep: Estimates she averages 6-7 hrs/night.  Not good about getting to bed on time.    24-hr recall suggests intake of ~1225 kcal: (Up at 6:30 AM) B (8:15 AM)-   3 egg whites, 1/4 c dry steel-cut oats, 1/2 c mixed fruit, 1/2 peach, 1/4 c lactaid  milk 18 g pro, 320 kcal Snk ( AM)-   --- L (12 PM)-  1/2 hambrgr patty, 2 slc [place], 1 rice cake       21 g pro, 240 kcal - worked out at [employer]; 12 oz water -  Snk (4:30)-  1 c lactaid milk, 1 scoop pro powder (25 g pro), 1 tbsp peanut butter   37 g pro, 210 kcal D (6:30 PM)-  1 thin pork chop, 1 c canned green beans, 4 oz sparkling water    30 g pro, 315 kcal Snk (7:30)-  5 oz fruit yogurt             6 g pro, 140 kcal  Typical day? Yes.  , although may have been a little less than normal.      Nutritional Diagnosis:  NI-1.4 Inadequate energy intake As related to energy expenditure.  As evidenced by estimated intake of 1225 kcal yesterday. ICD 10 diagnoses used today: Poor weight gain in adult, R62.7 and inadequate dietary caloric intake, E63.9.    Handouts given during visit include: After-Visit Summary (AVS)  Demonstrated degree of understanding via:  Teach Back  Barriers to learning/adherence to lifestyle change: Longstanding practices of restrictive eating.    Monitoring/Evaluation:  Dietary intake, exercise, and body weight in 8 week(s).

## 2021-01-17 DIAGNOSIS — J3089 Other allergic rhinitis: Secondary | ICD-10-CM | POA: Diagnosis not present

## 2021-01-23 ENCOUNTER — Ambulatory Visit: Payer: Medicare Other | Admitting: Family Medicine

## 2021-01-23 DIAGNOSIS — J3089 Other allergic rhinitis: Secondary | ICD-10-CM | POA: Diagnosis not present

## 2021-01-24 ENCOUNTER — Telehealth: Payer: Self-pay | Admitting: Internal Medicine

## 2021-01-24 NOTE — Telephone Encounter (Signed)
Patient is requesting Dr. [name] to submit referral to [facility] for her to have mammogram done in October  Please follow up w/ patient [phone]

## 2021-01-30 ENCOUNTER — Ambulatory Visit (INDEPENDENT_AMBULATORY_CARE_PROVIDER_SITE_OTHER): Payer: Medicare Other | Admitting: Podiatry

## 2021-01-30 ENCOUNTER — Encounter: Payer: Self-pay | Admitting: Podiatry

## 2021-01-30 ENCOUNTER — Other Ambulatory Visit: Payer: Self-pay

## 2021-01-30 ENCOUNTER — Ambulatory Visit: Payer: Medicare Other | Admitting: Internal Medicine

## 2021-01-30 DIAGNOSIS — J3089 Other allergic rhinitis: Secondary | ICD-10-CM | POA: Diagnosis not present

## 2021-01-30 DIAGNOSIS — B351 Tinea unguium: Secondary | ICD-10-CM

## 2021-01-30 DIAGNOSIS — L600 Ingrowing nail: Secondary | ICD-10-CM

## 2021-01-30 NOTE — Progress Notes (Signed)
Subjective:   Patient ID: [name], female   DOB: 70 y.o.   MRN: [id number]   HPI Patient presents concerned about third nail right being white and whether it may have been traumatized and she may lose it and also just wants everything checked   ROS      Objective:  Physical Exam  Neurovascular status intact with damaged third nail right that is partially loose discolored and mildly thick     Assessment:  Probability that there was trauma to the third digit right which created an abnormal nail     Plan:  H&P reviewed condition recommended the continuation of debridement technique which I did and it did for the most part come off and I advised on soaks and it should heal uneventfully.  Reappoint to recheck as needed

## 2021-01-31 DIAGNOSIS — J3089 Other allergic rhinitis: Secondary | ICD-10-CM | POA: Diagnosis not present

## 2021-01-31 NOTE — Telephone Encounter (Signed)
Patient states that [name] has not received the order for the imaging.   Please advise.

## 2021-01-31 NOTE — Telephone Encounter (Signed)
Called pt there ws no answer lmom referral has been sent.Marland KitchenJohny Chess

## 2021-01-31 NOTE — Telephone Encounter (Signed)
Printed order and faxed to [facility].Marland KitchenJohny Chess

## 2021-01-31 NOTE — Telephone Encounter (Signed)
It was done on 7/24.  Thanks

## 2021-02-03 DIAGNOSIS — H5712 Ocular pain, left eye: Secondary | ICD-10-CM | POA: Diagnosis not present

## 2021-02-06 ENCOUNTER — Other Ambulatory Visit: Payer: Self-pay | Admitting: Internal Medicine

## 2021-02-10 DIAGNOSIS — L821 Other seborrheic keratosis: Secondary | ICD-10-CM | POA: Diagnosis not present

## 2021-02-10 DIAGNOSIS — L304 Erythema intertrigo: Secondary | ICD-10-CM | POA: Diagnosis not present

## 2021-02-10 DIAGNOSIS — J3089 Other allergic rhinitis: Secondary | ICD-10-CM | POA: Diagnosis not present

## 2021-02-13 DIAGNOSIS — J3089 Other allergic rhinitis: Secondary | ICD-10-CM | POA: Diagnosis not present

## 2021-02-14 DIAGNOSIS — H11442 Conjunctival cysts, left eye: Secondary | ICD-10-CM | POA: Diagnosis not present

## 2021-02-14 DIAGNOSIS — H43393 Other vitreous opacities, bilateral: Secondary | ICD-10-CM | POA: Diagnosis not present

## 2021-02-14 DIAGNOSIS — H35361 Drusen (degenerative) of macula, right eye: Secondary | ICD-10-CM | POA: Diagnosis not present

## 2021-02-14 DIAGNOSIS — H2513 Age-related nuclear cataract, bilateral: Secondary | ICD-10-CM | POA: Diagnosis not present

## 2021-02-14 DIAGNOSIS — H16143 Punctate keratitis, bilateral: Secondary | ICD-10-CM | POA: Diagnosis not present

## 2021-02-14 DIAGNOSIS — G43B0 Ophthalmoplegic migraine, not intractable: Secondary | ICD-10-CM | POA: Diagnosis not present

## 2021-02-14 DIAGNOSIS — H43812 Vitreous degeneration, left eye: Secondary | ICD-10-CM | POA: Diagnosis not present

## 2021-02-14 DIAGNOSIS — H11122 Conjunctival concretions, left eye: Secondary | ICD-10-CM | POA: Diagnosis not present

## 2021-02-14 DIAGNOSIS — H1045 Other chronic allergic conjunctivitis: Secondary | ICD-10-CM | POA: Diagnosis not present

## 2021-02-14 DIAGNOSIS — H04123 Dry eye syndrome of bilateral lacrimal glands: Secondary | ICD-10-CM | POA: Diagnosis not present

## 2021-02-21 DIAGNOSIS — J3089 Other allergic rhinitis: Secondary | ICD-10-CM | POA: Diagnosis not present

## 2021-03-02 DIAGNOSIS — L249 Irritant contact dermatitis, unspecified cause: Secondary | ICD-10-CM | POA: Diagnosis not present

## 2021-03-03 ENCOUNTER — Other Ambulatory Visit: Payer: Self-pay | Admitting: Internal Medicine

## 2021-03-03 ENCOUNTER — Other Ambulatory Visit: Payer: Self-pay

## 2021-03-03 DIAGNOSIS — J3089 Other allergic rhinitis: Secondary | ICD-10-CM | POA: Diagnosis not present

## 2021-03-06 ENCOUNTER — Ambulatory Visit: Payer: Medicare Other | Admitting: Internal Medicine

## 2021-03-06 ENCOUNTER — Other Ambulatory Visit: Payer: Self-pay

## 2021-03-06 ENCOUNTER — Encounter: Payer: Self-pay | Admitting: Family Medicine

## 2021-03-06 ENCOUNTER — Ambulatory Visit (INDEPENDENT_AMBULATORY_CARE_PROVIDER_SITE_OTHER): Payer: Medicare Other | Admitting: Family Medicine

## 2021-03-06 DIAGNOSIS — R635 Abnormal weight gain: Secondary | ICD-10-CM | POA: Diagnosis not present

## 2021-03-06 DIAGNOSIS — R627 Adult failure to thrive: Secondary | ICD-10-CM | POA: Diagnosis not present

## 2021-03-06 DIAGNOSIS — E639 Nutritional deficiency, unspecified: Secondary | ICD-10-CM | POA: Diagnosis not present

## 2021-03-06 NOTE — Patient Instructions (Addendum)
Most hard cheeses are lactose-free.  Soft cheeses like brie have lactose, and cottage cheese has a lot of lactose.    Dairy milk alternatives: Soy is the ONLY alternative with any meaningful amount of protein.   Protein powder can be added to a soy or dairy milk at half-dose b/c you are getting 8 g of protein in the milk.   Prunes have been shown to improve bone density - 5-10 per day.    AREDS: There is good evidence behind these supplements.  You may want to use just Zeaxanthin and Lutein, and take a MVM supplement.    Nighttime snack:  If you still feel full from dinner, don't worry about having a snack.  If you are a bit hungry, however, it's probably a good idea to have a snack before bed, especially b/c you work out first thing in the AM usually.  Snack ideas:  Apple w/ PB, yogurt with granola or trail mix or just mixed nuts.    Continue with your previous goals:   1. Drink at least 48 oz of water.  Before you go to bed, pour 12 oz of water, and place this in the bathroom or bedroom - drink it first thing in the morning.  Aim for at least 24 oz consumed by lunch time.   2. For both lunch and dinner, as often as possible, get twice the volume of veg's vs. either protein or starch foods.  (Include some starchy foods!)   Sometimes in place of veg's, you can include fruit at those meals.    And: Suggestion for bone health: [facility] on [employer].  Ask for [facility].  Also recommended is 250 mg calcium citrate 1-2 X day.  You can get this dosage at [facility] on [address]    Follow-up:  As needed.

## 2021-03-06 NOTE — Progress Notes (Signed)
Medical Nutrition Therapy PCP Lew Dawes, MD  Patient has completed 2nd dose of COVID-19 vaccine 07/17/19. Appt start time: 1100 end time: 1200 (1 hour) Primary concerns today:  Wants to gain weight, but with a healthy diet, referred by her PCP Lew Dawes, MD .  Poor weight gain in adult, R62.7, and inadequate dietary caloric intake, E63.9.    Relevant history/background: [name] has worked out regularly since her 20's, and did body building and figure competition from her late 36's to early 84's.  She used a keto diet much of this time.  She is now concerned she has lost too much weight, and wants to optimize her diet and health.  She wants to know how many kcal she should eat.  A test for gluten intolerance was negative.  She suspects she has a lactase deficiency, but has not yet been tested.  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  LDL tends to run a bit high (although HDL on 12/16/20 was 74.3), so she limits total and saturated fat.  Diagnoses also include osteopenia.    Assessment: Namine said she has felt much better since her nutrition appt; she has increased water signficantly, is eating more veg's and protein foods.  She is weighing meats to ensure getting at least 3 oz per meal.  She was pleased to see a 2-lb weight gain today, which concurs with her home scale.    Learning Readiness: Ready  Weight: 102.6 lb, up 2 lb since 8/1 appt.   Recent eating pattern: 3 meals and 2 snacks per day.   Recent physical activity: Same: Works out 6 days a week, which includes 30-45 min cardio (HR 113-120) and 20-30 min resistance ex, usually 3 sets of 12-15 reps of moderate intensity. Sleep: Estimates she averages 6-7 hrs/night.  Not good about getting to bed on time.   24-hr recall suggests intake of ~1620 kcal:  (Up at 7 AM; 24 oz water  pre-bkfst) B (8 AM)-  1/3 c (dry) steel-cut oats, 3 egg whites, 1/4 c blueberries, 1/4 c almond milk Snk ( AM)-  water L (1 PM)-  pasta pizza: 1/2 c pasta, 1 egg, pizza sauce, 3/4 c mozzarella, olives, side salad, 2 tbsp drsng, water - worked out at gym: 20 min stair climber, 10 min row, 20-30 min biceps, triceps, & abs.  Snk ( PM)-  20 oz Pro-stat lemonade drink D (6:30 PM)-  3 oz pork chop, 1/2 c cottage chs, 1/2 pear, 1 c brown rice, 1 pkg potatoes/asparagus, water Snk ( PM)-  water Typical day? No. Usually has apple with PB with lunch, and usually works out in AM.      Previous estimated kcal intakes:  01/16/21:  1225   Nutritional Diagnosis:  NI-1.4 Inadequate energy intake As related to energy expenditure.  As evidenced by estimated intake of 1225 kcal yesterday.  Handouts given during visit include: After-Visit Summary (AVS)  Barriers to learning/adherence to lifestyle change: Longstanding practices of restrictive eating.    Monitoring/Evaluation:  Dietary intake, exercise, and body weight prn.  Pt will call for appt as questions arise.

## 2021-03-07 ENCOUNTER — Other Ambulatory Visit: Payer: Self-pay | Admitting: Internal Medicine

## 2021-03-10 DIAGNOSIS — J3089 Other allergic rhinitis: Secondary | ICD-10-CM | POA: Diagnosis not present

## 2021-03-11 ENCOUNTER — Other Ambulatory Visit: Payer: Self-pay | Admitting: Internal Medicine

## 2021-03-11 MED ORDER — DEPO-ESTRADIOL 5 MG/ML IM OIL: TOPICAL_OIL | INTRAMUSCULAR | 12 refills | Status: DC

## 2021-03-15 ENCOUNTER — Ambulatory Visit: Payer: Medicare Other | Admitting: Family Medicine

## 2021-03-15 NOTE — Progress Notes (Signed)
[name] [place] [address] [name] Phone: [phone] Subjective:   IVilma [name], am serving as a scribe for Dr. Hulan Saas. This visit occurred during the SARS-CoV-2 public health emergency.  Safety protocols were in place, including screening questions prior to the visit, additional usage of staff PPE, and extensive cleaning of exam room while observing appropriate contact time as indicated for disinfecting solutions.   I'm seeing this patient by the request  of:  Plotnikov, [name] Lacks, MD  CC: Low back pain  [id number]  Last seen in 2019/2020. Did see Dr. Georgina [name] July 2022. [name] is a 70 y.o. female coming in with complaint of back pain. Since she started doing single leg (asymmetrical) workouts with her personal trainer her lower back particularly on the right side has been hurting. She notices the pain first thing in the morning and when going from sitting to standing. The pain is short lived as the day progresses. This pain is characterized as dull and achy. At some point she does get back going across the lower abdomen.    Patient did have thoracic x-rays taken September 2021.  These were independently visualized by me showing no significant bony abnormality Reviewed patient's CT of the cervical spine done in February 2021 showing the patient did have spondylosis with foraminal and moderate spinal stenosis mostly at C5-C6. Last lumbar x-rays were taken in February 2019 that did show degenerative changes of the lumbar spine mild to moderate Patient also had x-rays of the left hip done in 2019 that did show mild osteoarthritic changes.    Past Medical History:  Diagnosis Date   Allergic rhinitis, cause unspecified    Backache, unspecified    Benign fasciculation-cramp syndrome 12/03/2012    Worsening with  stress, fatigue .   Calculus of kidney    Conversion disorder    Cotton wool spots    Esophageal reflux     Floater, vitreous, left 05/2016   GERD (gastroesophageal reflux disease)    Hemorrhage of rectum and anus    Internal hemorrhoids without mention of complication    Irritable bowel syndrome    Microscopic hematuria    Migraine headache with aura    Mild aortic insufficiency    Mild tricuspid regurgitation    Osteopenia    Other plastic surgery for unacceptable cosmetic appearance    Inj tx fllers/expander   Personal history of urinary calculi    Postmenopausal atrophic vaginitis    Premature atrial contractions    PVC (premature ventricular contraction)    a. Event monitor 2013.   Unspecified constipation    Vasovagal syncope    Past Surgical History:  Procedure Laterality Date   BREAST BIOPSY Right    Cosmetic Procedures w/Injection therapy     SKIN CANCER EXCISION Left    thigh   WISDOM TOOTH EXTRACTION     Social History   Socioeconomic History   Marital status: Married    Spouse name: [name]   Number of children: 0   Years of education: 14   Highest education level: Not on file  Occupational History   Occupation: OFFICE liasion    Employer: [employer]   Occupation: Real Data processing manager   Occupation: OFFICE LEAZON    Employer: [employer]  Tobacco Use   Smoking status: Never   Smokeless tobacco: Never  Vaping Use   Vaping Use: Never used  Substance and Sexual Activity   Alcohol use: No  Drug use: No   Sexual activity: Not Currently  Other Topics Concern   Not on file  Social History Narrative   Vocational School in [place]. Married '90 - marriage is in very good shape '12, No children.  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 Offerred medical services for them if needed (Nov '11)   Patient is married [name]) and lives at home with her husband.   Patient is working Administrator, arts work.   Patient has a high school education.   Patient is right-handed.    Patient does not drink any caffeine.      Social Determinants of Health   Financial Resource Strain: Low Risk    Difficulty of Paying Living Expenses: Not hard at all  Food Insecurity: No Food Insecurity   Worried About Charity fundraiser in the Last Year: Never true   [place] in the Last Year: Never true  Transportation Needs: No Transportation Needs   Lack of Transportation (Medical): No   Lack of Transportation (Non-Medical): No  Physical Activity: Sufficiently Active   Days of Exercise per Week: 6 days   Minutes of Exercise per Session: 60 min  Stress: No Stress Concern Present   Feeling of Stress : Not at all  Social Connections: Socially Integrated   Frequency of Communication with Friends and Family: More than three times a week   Frequency of Social Gatherings with Friends and Family: Once a week   Attends Religious Services: More than 4 times per year   Active Member of [employer] or Organizations: No   Attends Music therapist: More than 4 times per year   Marital Status: Married   Allergies  Allergen Reactions   Codeine Other (See Comments)    Doesn't remeber   Doxycycline Other (See Comments)   Epinephrine     Heart racing    Neomycin     Topical rash from cream   Penicillins Hives and Other (See Comments)    Has patient had a PCN reaction causing immediate rash, facial/tongue/throat swelling, SOB or lightheadedness with hypotension: Yes Has patient had a PCN reaction causing severe rash involving mucus membranes or skin necrosis: Yes Has patient had a PCN reaction that required hospitalization No Has patient had a PCN reaction occurring within the last 10 years: No If all of the above answers are "NO", then may proceed with Cephalosporin use.    Protonix [Pantoprazole Sodium]     ?HAs   Sulfonamide Derivatives Other (See Comments)    Doesn't remember    Zithromax [Azithromycin] Rash   Family History  Problem Relation Age of Onset    Hyperlipidemia Father    COPD Father        Smoker, deceased 07/02/14   Hypertension Father    Hyperlipidemia Mother    Hypertension Mother    Headache Sister    Diabetes Neg Hx    Colon cancer Neg Hx    Breast cancer Neg Hx    Coronary artery disease Neg Hx    Esophageal cancer Neg Hx    Stomach cancer Neg Hx    Pancreatic cancer Neg Hx    Liver disease Neg Hx     Current Outpatient Medications (Endocrine & Metabolic):    estradiol cypionate (DEPO-ESTRADIOL) 5 MG/ML injection, USE PV ONCE EVERY 3 DAYS  Current Outpatient Medications (Cardiovascular):    diltiazem (CARDIZEM) 30 MG  tablet, Take 1 tablet (30 mg total) by mouth daily as needed. (Patient not taking: Reported on 01/16/2021)   diltiazem (TIAZAC) 180 MG 24 hr capsule,    EPIPEN 2-PAK 0.3 MG/0.3ML SOAJ injection, See admin instructions. Reported on 10/08/2015 (Patient not taking: Reported on 01/16/2021)   flecainide (TAMBOCOR) 50 MG tablet, Take 1 tablet (50 mg total) by mouth daily as needed. For fast heart rate. (Patient not taking: Reported on 01/16/2021)  Current Outpatient Medications (Respiratory):    loratadine (CLARITIN) 10 MG tablet, Take 1 tablet (10 mg total) by mouth daily. (Patient not taking: Reported on 01/16/2021)   mometasone (NASONEX) 50 MCG/ACT nasal spray, 2 sprays in each nostril (Patient not taking: Reported on 01/16/2021)   Current Outpatient Medications (Hematological):    Cyanocobalamin (B-12) 1000 MCG SUBL, PLACE 1 TABLET (1,000 MCG TOTAL) UNDER THE TONGUE DAILY.  Current Outpatient Medications (Other):    ALPRAZolam (XANAX) 0.25 MG tablet, 0.5-1 po bid prn anxiety   Botulinum Toxin Type A, Cosm, 50 units SOLR, Inject 4-6 Units into the muscle. 3 month  injection   Cholecalciferol (VITAMIN D3 PO), Take 50 mcg by mouth daily.   DEXILANT 60 MG capsule, Take 1 capsule (60 mg total) by mouth daily.   EUCRISA 2 % OINT, Apply topically. Apply 1 to 2 times a day   famotidine (PEPCID) 40 MG tablet, Take 1 tablet  1 hour before bed (Patient not taking: Reported on 01/16/2021)   hyoscyamine (LEVSIN) 0.125 MG tablet, Take 1 tablet (0.125 mg total) by mouth every 4 (four) hours as needed for up to 10 days (chest spasms).   linaclotide (LINZESS) 72 MCG capsule, Take 1 capsule (72 mcg total) by mouth daily before breakfast. (Patient not taking: Reported on 01/16/2021)   tretinoin (RETIN-A) 0.05 % cream, Apply 1 application topically at bedtime.   UNABLE TO FIND, once a week. Med Name: allergy shots   valACYclovir (VALTREX) 500 MG tablet, Take 1 tablet (500 mg total) by mouth 2 (two) times daily. (Patient not taking: Reported on 01/16/2021)   Reviewed prior external information including notes and imaging from  primary care provider As well as notes that were available from care everywhere and other healthcare systems.  Past medical history, social, surgical and family history all reviewed in electronic medical record.  No pertanent information unless stated regarding to the chief complaint.   Review of Systems:  No headache, visual changes, nausea, vomiting, diarrhea, constipation, dizziness, abdominal pain, skin rash, fevers, chills, night sweats, weight loss, swollen lymph nodes, body aches, joint swelling, chest pain, shortness of breath, mood changes. POSITIVE muscle aches  Objective  Blood pressure 124/72, pulse 69, height 5\' 2"  (1.575 m), weight 102 lb (46.3 kg), SpO2 98 %.   General: No apparent distress alert and oriented x3 mood and affect normal, dressed appropriately.  HEENT: Pupils equal, extraocular movements intact  Respiratory: Patient's speak in full sentences and does not appear short of breath  Cardiovascular: No lower extremity edema, non tender, no erythema  Gait normal with good balance and coordination.  MSK: Exam shows the patient does have some mild loss lordosis.  Patient does have some tenderness to palpation in the gluteal area on the right side.  Seems to be more of the gluteus  minimus.  Patient does have tightness with FABER test on the right compared to the left.  Negative straight leg test.  No significant difficulty in the hamstring.    Impression and Recommendations:     The above  documentation has been reviewed and is accurate and complete Lyndal Pulley, DO

## 2021-03-16 ENCOUNTER — Ambulatory Visit (INDEPENDENT_AMBULATORY_CARE_PROVIDER_SITE_OTHER): Payer: Medicare Other | Admitting: Family Medicine

## 2021-03-16 ENCOUNTER — Other Ambulatory Visit: Payer: Self-pay

## 2021-03-16 ENCOUNTER — Ambulatory Visit: Payer: Medicare Other | Admitting: Family Medicine

## 2021-03-16 DIAGNOSIS — M7601 Gluteal tendinitis, right hip: Secondary | ICD-10-CM

## 2021-03-16 NOTE — Patient Instructions (Signed)
Do prescribed exercises at least 3x a week Ice after activity Decrease reps and slow down movement See you again in 5 weeks

## 2021-03-16 NOTE — Assessment & Plan Note (Signed)
I believe the patient is continuing to have more gluteal tendinitis with many of the possibility of sacroiliac dysfunction.  Discussed with patient about hip abductor strengthening and given some exercises.  Discussed decreasing the amount of repetitions patient is doing certain activities.  Follow-up with me again in 5 to 6 weeks to make sure patient is improving

## 2021-03-17 DIAGNOSIS — J3089 Other allergic rhinitis: Secondary | ICD-10-CM | POA: Diagnosis not present

## 2021-03-20 ENCOUNTER — Telehealth: Payer: Self-pay | Admitting: Internal Medicine

## 2021-03-20 DIAGNOSIS — R300 Dysuria: Secondary | ICD-10-CM

## 2021-03-20 NOTE — Telephone Encounter (Signed)
Patient would like referral to be placed for a gynecologist

## 2021-03-21 ENCOUNTER — Other Ambulatory Visit: Payer: Self-pay | Admitting: Internal Medicine

## 2021-03-21 DIAGNOSIS — R350 Frequency of micturition: Secondary | ICD-10-CM

## 2021-03-22 ENCOUNTER — Other Ambulatory Visit (INDEPENDENT_AMBULATORY_CARE_PROVIDER_SITE_OTHER): Payer: Medicare Other

## 2021-03-22 ENCOUNTER — Other Ambulatory Visit: Payer: Self-pay

## 2021-03-22 DIAGNOSIS — R350 Frequency of micturition: Secondary | ICD-10-CM | POA: Diagnosis not present

## 2021-03-22 LAB — URINALYSIS
Bilirubin Urine: NEGATIVE
Hgb urine dipstick: NEGATIVE
Ketones, ur: NEGATIVE
Leukocytes,Ua: NEGATIVE
Nitrite: NEGATIVE
Specific Gravity, Urine: 1.01 (ref 1.000–1.030)
Total Protein, Urine: NEGATIVE
Urine Glucose: NEGATIVE
Urobilinogen, UA: 0.2 (ref 0.0–1.0)
pH: 7 (ref 5.0–8.0)

## 2021-03-23 DIAGNOSIS — J3089 Other allergic rhinitis: Secondary | ICD-10-CM | POA: Diagnosis not present

## 2021-03-24 NOTE — Telephone Encounter (Signed)
OK. Thx

## 2021-03-24 NOTE — Telephone Encounter (Signed)
Notified pt w/MD response.../lmb 

## 2021-03-27 ENCOUNTER — Ambulatory Visit: Payer: Medicare Other | Admitting: Family Medicine

## 2021-03-27 DIAGNOSIS — N6489 Other specified disorders of breast: Secondary | ICD-10-CM | POA: Diagnosis not present

## 2021-03-27 DIAGNOSIS — Z1231 Encounter for screening mammogram for malignant neoplasm of breast: Secondary | ICD-10-CM | POA: Diagnosis not present

## 2021-03-27 LAB — HM MAMMOGRAPHY

## 2021-03-30 ENCOUNTER — Other Ambulatory Visit: Payer: Self-pay

## 2021-03-30 ENCOUNTER — Ambulatory Visit (INDEPENDENT_AMBULATORY_CARE_PROVIDER_SITE_OTHER): Payer: Medicare Other | Admitting: Obstetrics and Gynecology

## 2021-03-30 ENCOUNTER — Encounter: Payer: Self-pay | Admitting: Obstetrics and Gynecology

## 2021-03-30 VITALS — Ht 61.0 in | Wt 100.0 lb

## 2021-03-30 DIAGNOSIS — N760 Acute vaginitis: Secondary | ICD-10-CM

## 2021-03-30 DIAGNOSIS — N952 Postmenopausal atrophic vaginitis: Secondary | ICD-10-CM

## 2021-03-30 NOTE — Patient Instructions (Addendum)
Vaginitis Vaginitis is a condition in which the vaginal tissue swells and becomes irritated. This condition is most often caused by a change in the normal balance of bacteria and yeast that live in the vagina. This change causes an overgrowth of certain bacteria or yeast, which causes the inflammation. There are different types of vaginitis. What are the causes? The cause of this condition depends on the type of vaginitis. It can be caused by: Bacteria (bacterial vaginosis). Yeast, which is a fungus (candidiasis). A parasite (trichomoniasis vaginitis). A virus (viral vaginitis). Low hormone levels (atrophic vaginitis). Low hormone levels can occur during pregnancy, breastfeeding, or after menopause. Irritants, such as bubble baths, scented tampons, and feminine sprays (allergic vaginitis). Other factors can change the normal balance of the yeast and bacteria that live in the vagina. These include: Antibiotic medicines. Poor hygiene. Diaphragms, vaginal sponges, spermicides, birth control pills, and intrauterine devices (IUDs). Sex. Infection. Uncontrolled diabetes. A weakened body defense system (immune system). What increases the risk? This condition is more likely to develop in women who: Smoke or are exposed to secondhand smoke. Use vaginal douches, scented tampons, or scented sanitary pads. Wear tight-fitting pants or thong underwear. Use oral birth control pills or an IUD. Have sex without a condom or have multiple partners. Have an STI. Frequently use the spermicide nonoxynol-9. Eat lots of foods high in sugar or who have uncontrolled diabetes. Have low estrogen levels. Have a weakened immune system from an immune disorder or medical treatment. Are pregnant or breastfeeding. What are the signs or symptoms? Symptoms vary depending on the cause of the vaginitis. Common symptoms include: Abnormal vaginal discharge. The discharge is white, gray, or yellow with bacterial  vaginosis. The discharge is thick, white, and cheesy with a yeast infection. The discharge is frothy and yellow or greenish with trichomoniasis. A bad vaginal smell. The smell is fishy with bacterial vaginosis. Vaginal itching, pain, or swelling. Pain with sex. Pain or burning when urinating. Sometimes there are no symptoms. How is this diagnosed? This condition is diagnosed based on your symptoms and medical history. A physical exam, including a pelvic exam, will also be done. You may also have other tests, including: Tests to determine the pH level (acidity or alkalinity) of your vagina. A whiff test to assess the odor that results when a sample of your vaginal discharge is mixed with a potassium hydroxide solution. Tests of vaginal fluid. A sample will be examined under a microscope. How is this treated? Treatment varies depending on the type of vaginitis you have. Your treatment may include: Antibiotic creams or pills to treat bacterial vaginosis and trichomoniasis. Antifungal medicines, such as vaginal creams or suppositories, to treat a yeast infection. Medicine to ease discomfort if you have viral vaginitis. Your sexual partner should also be treated. Estrogen delivered in a cream, pill, suppository, or vaginal ring to treat atrophic vaginitis. If vaginal dryness occurs, lubricants and moisturizing creams may help. You may need to avoid scented soaps, sprays, or douches. Stopping use of a product that is causing allergic vaginitis and then using a vaginal cream to treat the symptoms. Follow these instructions at home: Lifestyle Keep your genital area clean and dry. Avoid soap, and only rinse the area with water. Do not douche or use tampons until your health care provider says it is okay. Use sanitary pads, if needed. Do not have sex until your health care provider approves. When you can return to sex, practice safe sex and use condoms. Wipe from front to   back. This avoids the spread  of bacteria from the rectum to the vagina. General instructions Take over-the-counter and prescription medicines only as told by your health care provider. If you were prescribed an antibiotic medicine, take or use it as told by your health care provider. Do not stop taking or using the antibiotic even if you start to feel better. Keep all follow-up visits. This is important. How is this prevented? Use mild, unscented products. Do not use things that can irritate the vagina, such as fabric softeners. Avoid the following products if they are scented: Feminine sprays. Detergents. Tampons. Feminine hygiene products. Soaps or bubble baths. Let air reach your genital area. To do this: Wear cotton underwear to reduce moisture buildup. Avoid wearing underwear while you sleep. Avoid wearing tight pants and underwear or nylons without a cotton panel. Avoid wearing thong underwear. Take off any wet clothing, such as bathing suits, as soon as possible. Practice safe sex and use condoms. Contact a health care provider if: You have abdominal or pelvic pain. You have a fever or chills. You have symptoms that last for more than 2-3 days. Get help right away if: You have a fever and your symptoms suddenly get worse. Summary Vaginitis is a condition in which the vaginal tissue becomes inflamed.This condition is most often caused by a change in the normal balance of bacteria and yeast that live in the vagina. Treatment varies depending on the type of vaginitis you have. Do not douche, use tampons, or have sex until your health care provider approves. When you can return to sex, practice safe sex and use condoms. This information is not intended to replace advice given to you by your health care provider. Make sure you discuss any questions you have with your health care provider. Document Revised: 12/03/2019 Document Reviewed: 12/03/2019 Elsevier Patient Education  2022 [place].  Atrophic  Vaginitis Atrophic vaginitis is a condition in which the tissues that line the vagina become dry and thin. This condition is most common in women who have stopped having regular menstrual periods (are in menopause). This usually starts when a woman is 31 to 68 years old. That is the time when a woman's estrogen levels begin to decrease. Estrogen is a female hormone. It helps to keep the tissues of the vagina moist. It stimulates the vagina to produce a clear fluid that lubricates the vagina for sex. This fluid also protects the vagina from infection. Lack of estrogen can cause the lining of the vagina to get thinner and dryer. The vagina may also shrink in size. It may become less elastic. Atrophic vaginitis tends to get worse over time as a woman's estrogen level drops. What are the causes? This condition is caused by the normal drop in estrogen that happens around the time of menopause. What increases the risk? Certain conditions or situations may lower a woman's estrogen level, leading to a higher risk for atrophic vaginitis. You are more likely to develop this condition if: You are taking medicines that block estrogen. You have had your ovaries removed. You are being treated for cancer with radiation or medicines (chemotherapy). You have given birth or are breastfeeding. You are older than age 43. You smoke. What are the signs or symptoms? Symptoms of this condition include: Pain, soreness, a feeling of pressure, or bleeding during sex (dyspareunia). Vaginal burning, irritation, or itching. Pain or bleeding when a speculum is used in a vaginal exam. Having burning pain while urinating. Vaginal discharge. In some  cases, there are no symptoms. How is this diagnosed? This condition is diagnosed based on your medical history and a physical exam. This will include a pelvic exam that checks the vaginal tissues. Though rare, you may also have other tests, including: A urine test. A test that  checks the acid balance in your vagina (acid balance test). How is this treated? Treatment for this condition depends on how severe your symptoms are. Treatment may include: Using an over-the-counter vaginal lubricant before sex. Using a long-acting vaginal moisturizer. Using low-dose estrogen for moderate to severe symptoms that do not respond to other treatments. Options include creams, tablets, and inserts (vaginal rings). Before you use a vaginal estrogen, tell your health care provider if you have a history of: Breast cancer. Endometrial cancer. Blood clots. If you are not sexually active and your symptoms are very mild, you may not need treatment. Follow these instructions at home: Medicines Take over-the-counter and prescription medicines only as told by your health care provider. Do not use herbal or alternative medicines unless your health care provider says that you can. Use over-the-counter creams, lubricants, or moisturizers for dryness only as told by your health care provider. General instructions If your atrophic vaginitis is caused by menopause, discuss all of your menopause symptoms and treatment options with your health care provider. Do not douche. Do not use products that can make your vagina dry. These include: Scented feminine sprays. Scented tampons. Scented soaps. Vaginal sex can help to improve blood flow and elasticity of vaginal tissue. If you choose to have sex and it hurts, try using a water-soluble lubricant or moisturizer right before having sex. Contact a health care provider if: Your discharge looks different than normal. Your vagina has an unusual smell. You have new symptoms. Your symptoms do not improve with treatment. Your symptoms get worse. Summary Atrophic vaginitis is a condition in which the tissues that line the vagina become dry and thin. It is most common in women who have stopped having regular menstrual periods (are in  menopause). Treatment options include using vaginal lubricants and low-dose vaginal estrogen. Contact a health care provider if your vagina has an unusual smell, or if your symptoms get worse or do not improve after treatment. This information is not intended to replace advice given to you by your health care provider. Make sure you discuss any questions you have with your health care provider. Document Revised: 12/03/2019 Document Reviewed: 12/03/2019 Elsevier Patient Education  [place].

## 2021-03-30 NOTE — Progress Notes (Signed)
GYNECOLOGY  VISIT   HPI: 70 y.o.   Married  Caucasian  female   No obstetric history on file. with No LMP recorded. Patient is postmenopausal.   here for external vaginal irritation for a couple of weeks. It is irritating but not all of the time.  No pain.  Patient had a urinalysis with her PCP and it was normal.   Works out daily.  With gym clothes on, she has some irritation.  She waxes and is due for this again tomorrow.   No vaginal discharge or odor.  No history of vaginitis.   Hx HSV I.  Uses Valtrex.   Using Premarin vaginal cream.  She uses 1 gram three times a week. Uses Ky jelly with intercourse.  She is familiar with Replens but has not tried this.   Asking about Josph Macho touch.   GYNECOLOGIC HISTORY: No LMP recorded. Patient is postmenopausal.  LMP age 85 yo.  Contraception:  PMP Menopausal hormone therapy:  Premarin vaginal cream Last mammogram:  03/2021--[facility] - normal per patient.  States cat C.  She usually does an ultrasound alternating with a mammogram every 6 month. Last pap smear: 01-13-19 Neg, 09-12-15 Neg, 09-11-12 Neg  No history of abnormal paps.         OB History   No obstetric history on file.        Patient Active Problem List   Diagnosis Date Noted   Gluteal tendinitis of right buttock 03/16/2021   Anxiety 11/03/2020   Stress 05/30/2020   Thoracic spine pain 02/17/2020   Allergic reaction to insect bite 10/21/2019   Herpes zoster 09/17/2019   Tachycardia 08/10/2019   Vaccination complication 16/03/9603   Vaccine reaction 07/20/2019   Migraine without aura and without status migrainosus, not intractable 05/27/2019   Migraine aura without headache 05/27/2019   Myoneural disorder, unspecified ([place]) 04/09/2019   Sore throat 06/13/2018   Friction injury to skin 04/14/2018   Occipital neuralgia 03/03/2018   B12 deficiency 03/03/2018   Ocular migraine 02/05/2018   Leg wound, right 12/26/2017   Bilateral impacted cerumen 10/21/2017    LLQ abdominal pain 09/23/2017   Elevated antinuclear antibody (ANA) level 07/26/2017   Arthralgia 07/26/2017   Degenerative disc disease, lumbar 07/04/2017   Cough 06/06/2017   Chest discomfort 03/21/2017   Neck mass 01/17/2017   Mass of neck 12/25/2016   Snoring 12/18/2016   Medial tibial stress syndrome, left, initial encounter 08/14/2016   Patellofemoral syndrome of both knees 08/09/2016   Drug allergy, antibiotic 05/31/2016   Hyperkalemia 02/17/2016   Vertical diplopia 02/15/2016   Tension headache 12/28/2015   BPPV (benign paroxysmal positional vertigo) 54/02/8118   TMJ click 14/78/2956   Cold sore 07/18/2015   Osteopenia 07/18/2015   Earache on left 07/18/2015   Cervical disc disorder with radiculopathy of cervical region 06/17/2015   Cotton wool spots 03/15/2015   Low back pain 03/08/2015   Food poisoning 03/08/2015   Generalized anxiety disorder 03/08/2015   Dysuria 02/23/2015   Well adult exam 09/09/2014   Internal hemorrhoids 09/07/2014   Allergic urticaria 08/20/2014   Acute sinusitis 08/18/2014   Neck muscle spasm 07/30/2014   Grief 06/17/2014   Mild aortic insufficiency    Mild tricuspid regurgitation    Vasovagal syncope    Premature atrial contractions    GERD (gastroesophageal reflux disease)    PVC (premature ventricular contraction)    Dense breast tissue 03/23/2014   Microhematuria 03/22/2014   Changing skin lesion 02/05/2014  Rash and nonspecific skin eruption 12/22/2013   Chronic meniscal tear of knee 12/21/2013   Benign fasciculations 12/11/2013   Strain of adductor magnus muscle of left lower extremity 05/21/2013   Benign fasciculation-cramp syndrome 12/03/2012   Vasovagal near syncope 11/02/2012   Migraine with aura 11/02/2012   Upper airway cough syndrome 08/16/2012   Bunion of great toe 09/11/2011   Routine health maintenance 09/11/2011   Mild mitral regurgitation by prior echocardiogram 05/15/2011   Chronic neck pain 03/07/2011   Skin  lesion 03/07/2011   DYSPLASTIC NEVUS, FACE 02/06/2010   HAND PAIN, BILATERAL 08/25/2009   Aortic valve disorder 08/02/2009   Abnormal involuntary movements(781.0) 07/28/2009   LUMBAR SPRAIN AND STRAIN 05/21/2009   Palpitations 02/03/2009   HEMORRHOIDS-INTERNAL 12/15/2008   Constipation 12/15/2008   IBS (irritable bowel syndrome) 12/15/2008   RECTAL BLEEDING 12/15/2008   RENAL CALCULUS, HX OF 11/29/2007   Herpes simplex virus (HSV) infection 11/03/2007   Vaginal atrophy 03/25/2007   GLOBUS HYSTERICUS 01/01/2007   RHINITIS, ALLERGIC NOS 01/01/2007    Past Medical History:  Diagnosis Date   Allergic rhinitis, cause unspecified    Backache, unspecified    Benign fasciculation-cramp syndrome 12/03/2012    Worsening with  stress, fatigue .   Calculus of kidney    Conversion disorder    Cotton wool spots    Esophageal reflux    Floater, vitreous, left 05/2016   GERD (gastroesophageal reflux disease)    Hemorrhage of rectum and anus    Internal hemorrhoids without mention of complication    Irritable bowel syndrome    Microscopic hematuria    Migraine headache with aura    Mild aortic insufficiency    Mild tricuspid regurgitation    Osteopenia    Other plastic surgery for unacceptable cosmetic appearance    Inj tx fllers/expander   Personal history of urinary calculi    Postmenopausal atrophic vaginitis    Premature atrial contractions    PVC (premature ventricular contraction)    a. Event monitor 2013.   Unspecified constipation    Vasovagal syncope     Past Surgical History:  Procedure Laterality Date   BREAST BIOPSY Right    Cosmetic Procedures w/Injection therapy     SKIN CANCER EXCISION Left    thigh   WISDOM TOOTH EXTRACTION      Current Outpatient Medications  Medication Sig Dispense Refill   ALPRAZolam (XANAX) 0.25 MG tablet 0.5-1 po bid prn anxiety 30 tablet 1   Botulinum Toxin Type A, Cosm, 50 units SOLR Inject 4-6 Units into the muscle. 3 month   injection     Cholecalciferol (VITAMIN D3 PO) Take 50 mcg by mouth daily.     Cyanocobalamin (B-12) 1000 MCG SUBL PLACE 1 TABLET (1,000 MCG TOTAL) UNDER THE TONGUE DAILY. 100 tablet 3   DEXILANT 60 MG capsule Take 1 capsule (60 mg total) by mouth daily. 90 capsule 3   EUCRISA 2 % OINT Apply topically. Apply 1 to 2 times a day     loratadine (CLARITIN) 10 MG tablet Take 1 tablet (10 mg total) by mouth daily. 90 tablet 3   mometasone (NASONEX) 50 MCG/ACT nasal spray 2 sprays in each nostril 3 each 3   PREMARIN vaginal cream SMARTSIG:Vaginal Every 12 Hours     tretinoin (RETIN-A) 0.05 % cream Apply 1 application topically at bedtime.     triamcinolone ointment (KENALOG) 0.1 % Apply topically 2 (two) times daily.     UNABLE TO FIND once a week. Med  Name: allergy shots     valACYclovir (VALTREX) 500 MG tablet Take 1 tablet (500 mg total) by mouth 2 (two) times daily. 10 tablet 2   diltiazem (CARDIZEM) 30 MG tablet Take 1 tablet (30 mg total) by mouth daily as needed. (Patient not taking: No sig reported) 30 tablet 1   diltiazem (TIAZAC) 180 MG 24 hr capsule  (Patient not taking: No sig reported)     EPIPEN 2-PAK 0.3 MG/0.3ML SOAJ injection See admin instructions. Reported on 10/08/2015 (Patient not taking: No sig reported)  1   famotidine (PEPCID) 40 MG tablet Take 1 tablet 1 hour before bed (Patient not taking: No sig reported) 30 tablet 2   flecainide (TAMBOCOR) 50 MG tablet Take 1 tablet (50 mg total) by mouth daily as needed. For fast heart rate. (Patient not taking: No sig reported) 90 tablet 3   hyoscyamine (LEVSIN) 0.125 MG tablet Take 1 tablet (0.125 mg total) by mouth every 4 (four) hours as needed for up to 10 days (chest spasms). 100 tablet 3   No current facility-administered medications for this visit.     ALLERGIES: Codeine, Doxycycline, Epinephrine, Neomycin, Penicillins, Protonix [pantoprazole sodium], Sulfonamide derivatives, and Zithromax [azithromycin]  Family History  Problem  Relation Age of Onset   Hyperlipidemia Mother    Hypertension Mother    Hyperlipidemia Father    COPD Father        Smoker, deceased 07/03/14   Hypertension Father    Hypertension Sister    Hyperlipidemia Sister    Headache Sister    Diabetes Neg Hx    Colon cancer Neg Hx    Breast cancer Neg Hx    Coronary artery disease Neg Hx    Esophageal cancer Neg Hx    Stomach cancer Neg Hx    Pancreatic cancer Neg Hx    Liver disease Neg Hx     Social History   Socioeconomic History   Marital status: Married    Spouse name: [name]   Number of children: 0   Years of education: 14   Highest education level: Not on file  Occupational History   Occupation: OFFICE liasion    Employer: [employer]   Occupation: Real Data processing manager   Occupation: OFFICE [employer]    Employer: [employer]  Tobacco Use   Smoking status: Never   Smokeless tobacco: Never  Vaping Use   Vaping Use: Never used  Substance and Sexual Activity   Alcohol use: No   Drug use: No   Sexual activity: Yes    Birth control/protection: Post-menopausal  Other Topics Concern   Not on file  Social History Narrative   Vocational School in [place]. Married '90 - marriage is in very good shape '12, No children.  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 Offerred medical services for them if needed (Nov '11)   Patient is married [name]) and lives at home with her husband.   Patient is working Administrator, arts work.   Patient has a high school education.   Patient is right-handed.   Patient does not drink any caffeine.      Social Determinants of Health   Financial Resource Strain: Low Risk    Difficulty of Paying Living Expenses: Not hard at all  Food Insecurity: No Food Insecurity   Worried About Charity fundraiser in the Last Year: Never true   Ran Out of  Food in the Last Year: Never true  Transportation Needs: No  Transportation Needs   Lack of Transportation (Medical): No   Lack of Transportation (Non-Medical): No  Physical Activity: Sufficiently Active   Days of Exercise per Week: 6 days   Minutes of Exercise per Session: 60 min  Stress: No Stress Concern Present   Feeling of Stress : Not at all  Social Connections: Socially Integrated   Frequency of Communication with Friends and Family: More than three times a week   Frequency of Social Gatherings with Friends and Family: Once a week   Attends Religious Services: More than 4 times per year   Active Member of [employer] or Organizations: No   Attends Music therapist: More than 4 times per year   Marital Status: Married  Human resources officer Violence: Not At Risk   Fear of Current or Ex-Partner: No   Emotionally Abused: No   Physically Abused: No   Sexually Abused: No    Review of Systems  All other systems reviewed and are negative.  PHYSICAL EXAMINATION:    Ht 5\' 1"  (1.549 m)   Wt 100 lb (45.4 kg)   BMI 18.89 kg/m     General appearance: alert, cooperative and appears stated age Head: Normocephalic, without obvious abnormality, atraumatic Lungs: clear to auscultation bilaterally Heart: regular rate and rhythm Abdomen: soft, non-tender, no masses,  no organomegaly Extremities: extremities normal, atraumatic, no cyanosis or edema No abnormal inguinal nodes palpated   Pelvic: External genitalia:  no lesions              Urethra:  normal appearing urethra with no masses, tenderness or lesions              Bartholins and Skenes: normal                 Vagina: normal appearing vagina with normal color and discharge, no lesions              Cervix: no lesions                Bimanual Exam:  Uterus:  normal size, contour, position, consistency, mobility, non-tender              Adnexa: no mass, fullness, tenderness         Chaperone was present for exam:  Estill Bamberg, CMA  ASSESSMENT  Vulvovaginitis. Vaginal atrophy.  Hx  migraine with aura. Hx oral HSV I.  Uses Valtrex.   PLAN  Nuswab.  She has Triamcinolone ointment already. She may used this bid x 3 - 7 days if needed for vulvar irritation.  Ok to do bikini wax.  Ok to use premarin cream per vagina.  I discussed potential side effects of vaginal estrogen cream to stimulate breast cancer growth, cause thromboembolic events, and increase fluid retention.  We reviewed water based lubricants, cooking oils, and vaginal vitamin E suppositories for treating menopausal vaginal dryness.  Josph Macho Touch reviewed.  This was not recommended.  She will see her PCP for breast and pelvic exams every 2 years.  She has declined pap continuation.  FU here prn.    An After Visit Summary was printed and given to the patient.  42 min total time was spent for this patient encounter, including preparation, face-to-face counseling with the patient, coordination of care, and documentation of the encounter.

## 2021-03-31 ENCOUNTER — Encounter: Payer: Self-pay | Admitting: Internal Medicine

## 2021-03-31 DIAGNOSIS — J3089 Other allergic rhinitis: Secondary | ICD-10-CM | POA: Diagnosis not present

## 2021-03-31 LAB — SURESWAB® ADVANCED VAGINITIS,TMA
CANDIDA SPECIES: NOT DETECTED
Candida glabrata: NOT DETECTED
SURESWAB(R) ADV BACTERIAL VAGINOSIS(BV),TMA: NEGATIVE
TRICHOMONAS VAGINALIS (TV),TMA: NOT DETECTED

## 2021-03-31 NOTE — Progress Notes (Signed)
Abstracted mammo

## 2021-04-06 DIAGNOSIS — J3089 Other allergic rhinitis: Secondary | ICD-10-CM | POA: Diagnosis not present

## 2021-04-11 ENCOUNTER — Ambulatory Visit (INDEPENDENT_AMBULATORY_CARE_PROVIDER_SITE_OTHER): Payer: Medicare Other | Admitting: Neurology

## 2021-04-11 VITALS — BP 122/70 | HR 69 | Ht 61.0 in | Wt 102.5 lb

## 2021-04-11 DIAGNOSIS — R253 Fasciculation: Secondary | ICD-10-CM | POA: Diagnosis not present

## 2021-04-11 DIAGNOSIS — G43109 Migraine with aura, not intractable, without status migrainosus: Secondary | ICD-10-CM | POA: Insufficient documentation

## 2021-04-11 NOTE — Progress Notes (Signed)
[facility]  Provider:  Dr Brett Fairy Referring Provider: Plotnikov, [name], MD Primary Care Physician:  Plotnikov, [name], MD    HPI:  [name] is a 70 y.o. female here as a patient of  Dr. [name] for follow up on her headaches. [name] has reported some intermittent palpitations be had intermittent palpitations discussed even in October 2018 but she feels that at this time they have been more common after her COVID vaccination with [facility] and for the last 4 months she has been symptom-free in that regard.  As I have mentioned before she no longer takes any caffeine she does not drink alcohol and she has occasional but rather rare benign fasciculations.  She is doing very well overall and we need once a year to address any questions that come up she sometimes reports auras but she no longer has daily headaches following these.      RV 04-11-2020: CT head in February 2021 was negative. CT cervical spine showed C4-5 foraminal narrowing, bone spurs, and DDD.  Her headaches have done better. She presented me a video of her neck facilitating - benign, no ALS, as we have many times discussed before. She has noted a correlation between caffeine and fasciculation. She is active at [facility]- and she is fully vaccinated.      RV  04-09-2019, retired from apartment managing, husband retired Emergency planning/management officer.  She still reports auras but no headaches after these. Rare left eye / temple headache 2 times a mont and these responds to Motrin within hours.  She has benign fasciculations. Takes cosmetic Botox and feels it helps with headches. I will arrange for Cefaly device and perhaps TMS procedure, depending on costs.   I see this patient today , 04-07-2018, in a yearly follow up-  Her husband just retired this Summer, is home.  She had a visual field restriction on one day,  Occurrence on August 7th 2019 - she had seen ophthalmology on call ,followed by her regular eye  specialist , [name]. Than was send for carotid doppler , which revealed wide open arteries ( Dr. Angelena Form). On 02-28-2018 she suddenly felt a severe neck pain. She had reached for something on the floor of her car- The pain went away, but the tenderness remained over the right occipital area. She wonders about occipital neuralgia. She still has benign fasciculations. She still takes omeprazole.   I had the pleasure of seeing [name] today on 04/01/2017, she has noticed that she has more headaches since taking pantoprazole. She started an every other day regimen and noted that headaches aren't changing according to the day of intake. She still struggles with reflux however and is awaiting appointments with Dr. Dorann Lodge often Dr. Henrene Pastor to address his underlying condition. Reflux causes her to cough at night and in daytime. She has also noted some heart palpitations, transiently reported substernal chest wall fasciculations that seemed to have resolved as spontaneously as they came.  I have the pleasure of seeing [name] today on 12/18/2016. She reports that she has still freedom from headaches, her fasciculations have much resolved, the lifestyle changes she told me about in 2017 have really helped her. She does have some neck arthritis and she has noticed that sometimes pain seems to radiate from the neck towards the head. Headaches may be announced by an AURA, nausea, resolve with motrin, can be associated with phonophobia.  We wondered if we should obtain an MRI brain and cervical spine at  the same time, I would be very much in favor of this, and [name] will still think about. In addition she reports that her husband plans to retire next year. She has noted that she sleeps best when alone. Her husband is a Insurance underwriter and is often not at home, she is worried that when they finally lift and sleep together in the same room that would be more interruption of her sleep but also her snoring may  interrupt his sleep. For this reason we will undergo a home sleep test to see if she has apnea or is truly just snoring. Again, most of the night she will spend alone and there is no witness to her asleep. She continues to be physically active, with a low body mass index and no medical conditions of concern at this time.   HPI- beginning 2012 :  The patient reports that she had 2 spells of near-syncope one in May 2014 and one in June of 2014 . She also has a history of migraines with aura, benign fasciculations which have affected also facial muscles, at times presenting like a tic. The benign fasciculations have been present for over 2 decades and are not related to a minor atrophic lateral sclerosis. Pulse of the near-syncope spells but the patient reported had a diplopia component and appeared to have been experienced as a vertical. The patient reports that the first spell was likely related to dehydration but is not quite sure about the second. The spells don't last long and if she takes fluid and fluid they quickly resolve and do not return. She was sent by her primary care physician for an MRI of the brain which returned normal. She has been seen by [name]  for the brief dizziness, loss of vision , diplopia. She is diagnosed of with macular degeneration early stage , myopia, and ocular migraines.  Vision was 20/25 in both eyes, ocular  pressure was 18 in the right, and 19 mm in the left eye and [name]  reported that the patient is early cataract formation but she can see well.The optic nerves are healthy , but there is a macular drusen in the right eye he recommended ocular vitamins and to continue contact lens use the patient was otherwise found to be in good health ,and he did not impose any restrictions on driving activities of daily living etc. The detailed report is reviewed here today as is her MRI report which was entirely normal. She has meanwhile reduced or eliminated her caffeine intake  and noticed a reduction in twitches. Her results of the EMG and NCV study have helped her to relax and fee safe with her condition. She is snoring now, which doesn't bother her but her spouse.  She has noted a feeling of thumping at the right ear and her ENT suggested that this may be a manifestation of her fasciculations. Her parents lives in [place], [place]. Close to her sister, her father is in a rehab facility. Her husband is an Emergency planning/management officer, now with [employer].   09-13-2015 Benign fasciculations resolved !  Patient presents now with an ear pain, left side only. Nothing visible on the left ear.  Her orthodontist noted some TMJ  click.  At the same time she had half sided headaches. These were not neuralgic and have resolved.  The patient does not recall having any zoster outbreaks, she did have some aphtose stomatitis non-herpetic. Dentist found no herpetic lesions and [name] tested for  zoster- negative. Since she does have a slight click at the left temporomandibular joint I recommend that she will purchase a baby toothbrush, one of those that are worn on top of the index finger like a thimble, and massages from the inside the area of the TMJ joint, she can use it on the outside as well.  This helps TMJ pain and click to disappear quicker. At this time I'm just happy that her symptoms have resolved and I'm happy to see her for any recurrent issues or new problems in the future.  02-15-2016, [name] has experienced an isolated event of diplopia that she related to the left eye. She also has a recurrence of migrainous headaches but they're all a has changed. She used to experience fortification lines which is  now associated with a very bright light perception. The visual aura the last 20-30 minutes. A lot of times there is no headache to follow. She experienced the diplopia before she saw her ophthalmologist [name], who then ordered a carotid Doppler study which returned with  clinically insignificant results. Today, her headache is more like a pressure. She continued allergy shots with [name]. A few years ago she has experienced vertical diplopia, and MRI in 2014  was negative- ordered by Dr [name] at the time  . She may take 3 Motrin in a week. I explained that migraines can change to this state with reaching the 5th decade of life. Many women that experienced severe nausea and headaches associated with menstrual cycles, has later in life the aura  without headaches to follow . Is benign fasciculations there is truly not changed there is no treatment available nor is it necessary. Is important for the patient to stay hydrated and could not get hypoglycemic as these will lead to presyncopal spells as well as an aunt who took and frequency rise in fasciculations, an  asked her to restrict caffeine intake.  03-27-2016,  patient reports resolution of headaches after quitting chocolate covered almonds ! No more diplopia , rare fasciculation. All tests were normal.     Review of Systems: Out of a complete 14 system review, the patient complains of only the following symptoms, and all other reviewed systems are negative. Less twitching . Snoring reported again, she is complaining of nasal drip , eye itching . HST negative .She is treated by Warren Lacy, MD at [facility]. She inquired about a sleep evaluation. I would like for her to use her nasal spray at night to see if she would stop mouth breathing which is the main reason for snoring. She reports diplopia, and related this to Dr [name] who in return ordered to evaluate her carotid artery- again negative for significant stenosis - 01-2018.    She reports no changes in handwriting,  Voice , or  swallowing.   Social History   Socioeconomic History   Marital status: Married    Spouse name: [name]   Number of children: 0   Years of education: 14   Highest education level: Not on file  Occupational History    Occupation: OFFICE liasion    Employer: [employer]   Occupation: Real Data processing manager   Occupation: OFFICE LEAZON    Employer: [employer]  Tobacco Use   Smoking status: Never   Smokeless tobacco: Never  Vaping Use   Vaping Use: Never used  Substance and Sexual Activity   Alcohol use: No   Drug use: No   Sexual activity:  Yes    Birth control/protection: Post-menopausal  Other Topics Concern   Not on file  Social History Narrative   Vocational School in [place]. Married '90 - marriage is in very good shape '12, No children.  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 Offerred medical services for them if needed (Nov '11)   Patient is married [name]) and lives at home with her husband.   Patient is working Administrator, arts work.   Patient has a high school education.   Patient is right-handed.   Patient does not drink any caffeine.      Social Determinants of Health   Financial Resource Strain: Low Risk    Difficulty of Paying Living Expenses: Not hard at all  Food Insecurity: No Food Insecurity   Worried About Charity fundraiser in the Last Year: Never true   [place] in the Last Year: Never true  Transportation Needs: No Transportation Needs   Lack of Transportation (Medical): No   Lack of Transportation (Non-Medical): No  Physical Activity: Sufficiently Active   Days of Exercise per Week: 6 days   Minutes of Exercise per Session: 60 min  Stress: No Stress Concern Present   Feeling of Stress : Not at all  Social Connections: Socially Integrated   Frequency of Communication with Friends and Family: More than three times a week   Frequency of Social Gatherings with Friends and Family: Once a week   Attends Religious Services: More than 4 times per year   Active Member of [employer] or Organizations: No   Attends Music therapist: More than 4 times per year    Marital Status: Married  Human resources officer Violence: Not At Risk   Fear of Current or Ex-Partner: No   Emotionally Abused: No   Physically Abused: No   Sexually Abused: No    Family History  Problem Relation Age of Onset   Hyperlipidemia Mother    Hypertension Mother    Hyperlipidemia Father    COPD Father        Smoker, deceased 06-18-14   Hypertension Father    Hypertension Sister    Hyperlipidemia Sister    Headache Sister    Diabetes Neg Hx    Colon cancer Neg Hx    Breast cancer Neg Hx    Coronary artery disease Neg Hx    Esophageal cancer Neg Hx    Stomach cancer Neg Hx    Pancreatic cancer Neg Hx    Liver disease Neg Hx     Past Medical History:  Diagnosis Date   Allergic rhinitis, cause unspecified    Backache, unspecified    Benign fasciculation-cramp syndrome 12/03/2012    Worsening with  stress, fatigue .   Calculus of kidney    Conversion disorder    Cotton wool spots    Esophageal reflux    Floater, vitreous, left Jun 18, 2016   GERD (gastroesophageal reflux disease)    Hemorrhage of rectum and anus    Internal hemorrhoids without mention of complication    Irritable bowel syndrome    Microscopic hematuria    Migraine headache with aura    Mild aortic insufficiency    Mild tricuspid regurgitation    Osteopenia    Other plastic surgery for unacceptable cosmetic appearance    Inj tx fllers/expander   Personal history of urinary calculi    Postmenopausal atrophic vaginitis  Premature atrial contractions    PVC (premature ventricular contraction)    a. Event monitor 2013.   Unspecified constipation    Vasovagal syncope     Past Surgical History:  Procedure Laterality Date   BREAST BIOPSY Right    Cosmetic Procedures w/Injection therapy     SKIN CANCER EXCISION Left    thigh   WISDOM TOOTH EXTRACTION      Current Outpatient Medications  Medication Sig Dispense Refill   ALPRAZolam (XANAX) 0.25 MG tablet 0.5-1 po bid prn anxiety 30 tablet 1    Botulinum Toxin Type A, Cosm, 50 units SOLR Inject 4-6 Units into the muscle. 3 month  injection     Cholecalciferol (VITAMIN D3 PO) Take 50 mcg by mouth daily.     Cyanocobalamin (B-12) 1000 MCG SUBL PLACE 1 TABLET (1,000 MCG TOTAL) UNDER THE TONGUE DAILY. 100 tablet 3   DEXILANT 60 MG capsule Take 1 capsule (60 mg total) by mouth daily. 90 capsule 3   diltiazem (CARDIZEM) 30 MG tablet Take 1 tablet (30 mg total) by mouth daily as needed. 30 tablet 1   diltiazem (TIAZAC) 180 MG 24 hr capsule      EPIPEN 2-PAK 0.3 MG/0.3ML SOAJ injection See admin instructions. Reported on 10/08/2015  1   EUCRISA 2 % OINT Apply topically. Apply 1 to 2 times a day     famotidine (PEPCID) 40 MG tablet Take 1 tablet 1 hour before bed 30 tablet 2   flecainide (TAMBOCOR) 50 MG tablet Take 1 tablet (50 mg total) by mouth daily as needed. For fast heart rate. 90 tablet 3   loratadine (CLARITIN) 10 MG tablet Take 1 tablet (10 mg total) by mouth daily. 90 tablet 3   mometasone (NASONEX) 50 MCG/ACT nasal spray 2 sprays in each nostril 3 each 3   PREMARIN vaginal cream SMARTSIG:Vaginal Every 12 Hours     tretinoin (RETIN-A) 0.05 % cream Apply 1 application topically at bedtime.     triamcinolone ointment (KENALOG) 0.1 % Apply topically 2 (two) times daily.     UNABLE TO FIND once a week. Med Name: allergy shots     valACYclovir (VALTREX) 500 MG tablet Take 1 tablet (500 mg total) by mouth 2 (two) times daily. 10 tablet 2   hyoscyamine (LEVSIN) 0.125 MG tablet Take 1 tablet (0.125 mg total) by mouth every 4 (four) hours as needed for up to 10 days (chest spasms). 100 tablet 3   No current facility-administered medications for this visit.    Allergies as of 04/11/2021 - Review Complete 03/30/2021  Allergen Reaction Noted   Codeine Other (See Comments)    Doxycycline Other (See Comments) 05/31/2016   Epinephrine  08/12/2012   Neomycin  03/08/2015   Penicillins Hives and Other (See Comments)    Protonix [pantoprazole  sodium]  06/06/2017   Sulfonamide derivatives Other (See Comments)    Zithromax [azithromycin] Rash 08/18/2014    Vitals: BP 122/70   Pulse 69   Ht 5\' 1"  (1.549 m)   Wt 102 lb 8 oz (46.5 kg)   BMI 19.37 kg/m  Last Weight:  Wt Readings from Last 1 Encounters:  04/11/21 102 lb 8 oz (46.5 kg)   Last Height:   Ht Readings from Last 1 Encounters:  04/11/21 5\' 1"  (1.549 m)   Vision Screening:  Left eye with correction 25/20.  Right eye with correction 25/20. Monovision, contacts.   Physical exam:  General: The patient is awake, alert and appears not in acute  distress. The patient is well groomed. Head: Normocephalic, atraumatic. Neck is supple. Mallampati 1- wide open-  neck circumference 12.5 inches, No retrognathia. TMJ click on the right.  Cardiovascular:  Regular rate and rhythm, without  murmurs or carotid bruit, and without distended neck veins.  Respiratory: Lungs are clear to auscultation. Skin:  Without evidence of edema, or rash Trunk: BMI low normal. 19 kg/m2.   Neurologic exam : The patient is awake and alert, oriented to place and time.   Memory subjective described as intact. There is a normal attention span & concentration ability.  Speech is fluent without  dysarthria, dysphonia or aphasia.  Mood and affect are appropriate.  Cranial nerves: Pupils are equal and briskly reactive to light. Extraocular movements  in vertical and horizontal planes intact and without nystagmus. Never had nystagmus.  Hearing to finger rub intact.  Facial sensation intact to fine touch. Facial motor strength is symmetric and tongue and uvula move midline. Motor exam: Normal tone and normal muscle bulk and symmetric normal strength in all extremities. No fasciculations.  Sensory:  vibration sensation intact in all extremities.  Coordination: Rapid alternating movements in the fingers/hands is normal.  Finger-to-nose maneuver tested and normal without evidence of ataxia, dysmetria or  tremor. Gait and station: Patient walks without assistive device.  Deep tendon reflexes: in the upper and lower extremities are symmetric and intact.   Assessment:  After physical and neurologic examination, review of laboratory studies, imaging, neurophysiology testing and pre-existing records.  Benign fasciculations are rare if not resolved ! She stayed off caffeine.   She has rarely palpitations, since reflux is controlled. Dr. Henrene Pastor is her GI specialist.  She is not drinking alcohol, not eating late.  Her orthodontist noted some TMJ click, she did not have pain. We will address snoring in a device- she has no need for CPAP. Her HST was negative for sleep apnea.   She had tachycardia twice a month following her Covid vaccination- DR. Camnitz has seen her.    Headaches resolved since August 2018 after she stopped eating chocolate covered almonds.  Diplopia horizontal, related to stress situations isolated events- , no longer present since early 2018. Amaurosis fugax episode versus complicated migraine- negative dopplers in 2019.  We assume that this was migraine related.  Dr [name] has been mentioning macular changes , she has started a vitamin for prevention.  Yearly follow up next in 20223   Larey Seat, MD   CC Dr. Loni Muse. Plotnikov

## 2021-04-12 ENCOUNTER — Other Ambulatory Visit: Payer: Self-pay

## 2021-04-12 ENCOUNTER — Ambulatory Visit (INDEPENDENT_AMBULATORY_CARE_PROVIDER_SITE_OTHER): Payer: Medicare Other | Admitting: Internal Medicine

## 2021-04-12 ENCOUNTER — Encounter: Payer: Self-pay | Admitting: Internal Medicine

## 2021-04-12 VITALS — BP 120/76 | HR 75 | Ht 61.0 in | Wt 101.6 lb

## 2021-04-12 DIAGNOSIS — R922 Inconclusive mammogram: Secondary | ICD-10-CM | POA: Diagnosis not present

## 2021-04-12 DIAGNOSIS — E538 Deficiency of other specified B group vitamins: Secondary | ICD-10-CM

## 2021-04-12 DIAGNOSIS — R9230 Dense breasts, unspecified: Secondary | ICD-10-CM

## 2021-04-12 DIAGNOSIS — R002 Palpitations: Secondary | ICD-10-CM | POA: Diagnosis not present

## 2021-04-12 NOTE — Progress Notes (Deleted)
[place] Phone: [phone] Subjective:    I'm seeing this patient by the request  of:  Plotnikov, [name], MD  CC:   [id number]  03/16/2021 I believe the patient is continuing to have more gluteal tendinitis with many of the possibility of sacroiliac dysfunction.  Discussed with patient about hip abductor strengthening and given some exercises.  Discussed decreasing the amount of repetitions patient is doing certain activities.  Follow-up with me again in 5 to 6 weeks to make sure patient is improving  Update 04/20/2021 Nealie Mchatton is a 70 y.o. female coming in with complaint of R glute pain. Patient states        Past Medical History:  Diagnosis Date   Allergic rhinitis, cause unspecified    Backache, unspecified    Benign fasciculation-cramp syndrome 12/03/2012    Worsening with  stress, fatigue .   Calculus of kidney    Conversion disorder    Cotton wool spots    Esophageal reflux    Floater, vitreous, left 05/2016   GERD (gastroesophageal reflux disease)    Hemorrhage of rectum and anus    Internal hemorrhoids without mention of complication    Irritable bowel syndrome    Microscopic hematuria    Migraine headache with aura    Mild aortic insufficiency    Mild tricuspid regurgitation    Osteopenia    Other plastic surgery for unacceptable cosmetic appearance    Inj tx fllers/expander   Personal history of urinary calculi    Postmenopausal atrophic vaginitis    Premature atrial contractions    PVC (premature ventricular contraction)    a. Event monitor 2013.   Unspecified constipation    Vasovagal syncope    Past Surgical History:  Procedure Laterality Date   BREAST BIOPSY Right    Cosmetic Procedures w/Injection therapy     SKIN CANCER EXCISION Left    thigh   WISDOM TOOTH EXTRACTION     Social History   Socioeconomic History   Marital status: Married    Spouse name:  [name]   Number of children: 0   Years of education: 14   Highest education level: Not on file  Occupational History   Occupation: OFFICE liasion    Employer: [employer]   Occupation: Real Data processing manager   Occupation: OFFICE LEAZON    Employer: [employer]  Tobacco Use   Smoking status: Never   Smokeless tobacco: Never  Vaping Use   Vaping Use: Never used  Substance and Sexual Activity   Alcohol use: No   Drug use: No   Sexual activity: Yes    Birth control/protection: Post-menopausal  Other Topics Concern   Not on file  Social History Narrative   Vocational School in [place]. Married '90 - marriage is in very good shape '12, No children.  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 Offerred medical services for them if needed (Nov '11)   Patient is married [name]) and lives at home with her husband.   Patient is working Administrator, arts work.   Patient has a high school education.   Patient is right-handed.   Patient does not drink any caffeine.      Social Determinants of Health   Financial Resource Strain: Low Risk    Difficulty of Paying Living Expenses: Not hard at all  Food Insecurity:  No Food Insecurity   Worried About Charity fundraiser in the Last Year: Never true   Ran Out of Food in the Last Year: Never true  Transportation Needs: No Transportation Needs   Lack of Transportation (Medical): No   Lack of Transportation (Non-Medical): No  Physical Activity: Sufficiently Active   Days of Exercise per Week: 6 days   Minutes of Exercise per Session: 60 min  Stress: No Stress Concern Present   Feeling of Stress : Not at all  Social Connections: Socially Integrated   Frequency of Communication with Friends and Family: More than three times a week   Frequency of Social Gatherings with Friends and Family: Once a week   Attends Religious Services: More than 4 times per  year   Active Member of [employer] or Organizations: No   Attends Music therapist: More than 4 times per year   Marital Status: Married   Allergies  Allergen Reactions   Codeine Other (See Comments)    Doesn't remeber   Doxycycline Other (See Comments)   Epinephrine     Heart racing    Neomycin     Topical rash from cream   Penicillins Hives and Other (See Comments)    Has patient had a PCN reaction causing immediate rash, facial/tongue/throat swelling, SOB or lightheadedness with hypotension: Yes Has patient had a PCN reaction causing severe rash involving mucus membranes or skin necrosis: Yes Has patient had a PCN reaction that required hospitalization No Has patient had a PCN reaction occurring within the last 10 years: No If all of the above answers are "NO", then may proceed with Cephalosporin use.    Protonix [Pantoprazole Sodium]     ?HAs   Sulfonamide Derivatives Other (See Comments)    Doesn't remember    Zithromax [Azithromycin] Rash   Family History  Problem Relation Age of Onset   Hyperlipidemia Mother    Hypertension Mother    Hyperlipidemia Father    COPD Father        Smoker, deceased 21-Jun-2014   Hypertension Father    Hypertension Sister    Hyperlipidemia Sister    Headache Sister    Diabetes Neg Hx    Colon cancer Neg Hx    Breast cancer Neg Hx    Coronary artery disease Neg Hx    Esophageal cancer Neg Hx    Stomach cancer Neg Hx    Pancreatic cancer Neg Hx    Liver disease Neg Hx      Current Outpatient Medications (Cardiovascular):    diltiazem (CARDIZEM) 30 MG tablet, Take 1 tablet (30 mg total) by mouth daily as needed.   diltiazem (TIAZAC) 180 MG 24 hr capsule,    EPIPEN 2-PAK 0.3 MG/0.3ML SOAJ injection, See admin instructions. Reported on 10/08/2015   flecainide (TAMBOCOR) 50 MG tablet, Take 1 tablet (50 mg total) by mouth daily as needed. For fast heart rate.  Current Outpatient Medications (Respiratory):    loratadine (CLARITIN)  10 MG tablet, Take 1 tablet (10 mg total) by mouth daily.   mometasone (NASONEX) 50 MCG/ACT nasal spray, 2 sprays in each nostril   Current Outpatient Medications (Hematological):    Cyanocobalamin (B-12) 1000 MCG SUBL, PLACE 1 TABLET (1,000 MCG TOTAL) UNDER THE TONGUE DAILY.  Current Outpatient Medications (Other):    ALPRAZolam (XANAX) 0.25 MG tablet, 0.5-1 po bid prn anxiety   Botulinum Toxin Type A, Cosm, 50 units SOLR, Inject 4-6 Units into the muscle. 3 month  injection  Cholecalciferol (VITAMIN D3 PO), Take 50 mcg by mouth daily.   DEXILANT 60 MG capsule, Take 1 capsule (60 mg total) by mouth daily.   EUCRISA 2 % OINT, Apply topically. Apply 1 to 2 times a day   famotidine (PEPCID) 40 MG tablet, Take 1 tablet 1 hour before bed   hyoscyamine (LEVSIN) 0.125 MG tablet, Take 1 tablet (0.125 mg total) by mouth every 4 (four) hours as needed for up to 10 days (chest spasms).   PREMARIN vaginal cream, SMARTSIG:Vaginal Every 12 Hours   tretinoin (RETIN-A) 0.05 % cream, Apply 1 application topically at bedtime.   triamcinolone ointment (KENALOG) 0.1 %, Apply topically 2 (two) times daily.   UNABLE TO FIND, once a week. Med Name: allergy shots   valACYclovir (VALTREX) 500 MG tablet, Take 1 tablet (500 mg total) by mouth 2 (two) times daily.   Reviewed prior external information including notes and imaging from  primary care provider As well as notes that were available from care everywhere and other healthcare systems.  Past medical history, social, surgical and family history all reviewed in electronic medical record.  No pertanent information unless stated regarding to the chief complaint.   Review of Systems:  No headache, visual changes, nausea, vomiting, diarrhea, constipation, dizziness, abdominal pain, skin rash, fevers, chills, night sweats, weight loss, swollen lymph nodes, body aches, joint swelling, chest pain, shortness of breath, mood changes. POSITIVE muscle  aches  Objective  There were no vitals taken for this visit.   General: No apparent distress alert and oriented x3 mood and affect normal, dressed appropriately.  HEENT: Pupils equal, extraocular movements intact  Respiratory: Patient's speak in full sentences and does not appear short of breath  Cardiovascular: No lower extremity edema, non tender, no erythema  Gait normal with good balance and coordination.  MSK:  Non tender with full range of motion and good stability and symmetric strength and tone of shoulders, elbows, wrist, hip, knee and ankles bilaterally.     Impression and Recommendations:     The above documentation has been reviewed and is accurate and complete Jacqualin Combes

## 2021-04-12 NOTE — Patient Instructions (Signed)
Valerian root for anxiety 

## 2021-04-12 NOTE — Progress Notes (Signed)
Subjective:  Patient ID: [name], female    DOB: 04-25-1951  Age: 70 y.o. MRN: [id number]  CC: Follow-up (Want to discuss getting covid booster)   HPI Nickisha Hum presents for dense breasts - needs [place] in April A question about COVID vaccine  Outpatient Medications Prior to Visit  Medication Sig Dispense Refill   ALPRAZolam (XANAX) 0.25 MG tablet 0.5-1 po bid prn anxiety 30 tablet 1   Botulinum Toxin Type A, Cosm, 50 units SOLR Inject 4-6 Units into the muscle. 3 month  injection     Cholecalciferol (VITAMIN D3 PO) Take 50 mcg by mouth daily.     Cyanocobalamin (B-12) 1000 MCG SUBL PLACE 1 TABLET (1,000 MCG TOTAL) UNDER THE TONGUE DAILY. 100 tablet 3   DEXILANT 60 MG capsule Take 1 capsule (60 mg total) by mouth daily. 90 capsule 3   diltiazem (CARDIZEM) 30 MG tablet Take 1 tablet (30 mg total) by mouth daily as needed. 30 tablet 1   diltiazem (TIAZAC) 180 MG 24 hr capsule      EPIPEN 2-PAK 0.3 MG/0.3ML SOAJ injection See admin instructions. Reported on 10/08/2015  1   EUCRISA 2 % OINT Apply topically. Apply 1 to 2 times a day     famotidine (PEPCID) 40 MG tablet Take 1 tablet 1 hour before bed 30 tablet 2   flecainide (TAMBOCOR) 50 MG tablet Take 1 tablet (50 mg total) by mouth daily as needed. For fast heart rate. 90 tablet 3   loratadine (CLARITIN) 10 MG tablet Take 1 tablet (10 mg total) by mouth daily. 90 tablet 3   mometasone (NASONEX) 50 MCG/ACT nasal spray 2 sprays in each nostril 3 each 3   Multiple Vitamins-Minerals (ICAPS) TABS Take 1 tablet by mouth 2 (two) times daily.     PREMARIN vaginal cream SMARTSIG:Vaginal Every 12 Hours     tretinoin (RETIN-A) 0.05 % cream Apply 1 application topically at bedtime.     triamcinolone ointment (KENALOG) 0.1 % Apply topically 2 (two) times daily.     UNABLE TO FIND once a week. Med Name: allergy shots     valACYclovir (VALTREX) 500 MG tablet Take 1 tablet (500 mg total) by mouth 2 (two) times daily. 10 tablet 2    hyoscyamine (LEVSIN) 0.125 MG tablet Take 1 tablet (0.125 mg total) by mouth every 4 (four) hours as needed for up to 10 days (chest spasms). 100 tablet 3   No facility-administered medications prior to visit.    ROS: Review of Systems  Constitutional:  Negative for activity change, appetite change, chills, fatigue and unexpected weight change.  HENT:  Negative for congestion, mouth sores and sinus pressure.   Eyes:  Negative for visual disturbance.  Respiratory:  Negative for cough and chest tightness.   Gastrointestinal:  Negative for abdominal pain and nausea.  Genitourinary:  Negative for difficulty urinating, frequency and vaginal pain.  Musculoskeletal:  Negative for back pain and gait problem.  Skin:  Negative for pallor and rash.  Neurological:  Negative for dizziness, tremors, weakness, numbness and headaches.  Psychiatric/Behavioral:  Negative for confusion, sleep disturbance and suicidal ideas.    Objective:  BP 120/76 (BP Location: Left Arm)   Pulse 75   Ht 5\' 1"  (1.549 m)   Wt 101 lb 9.6 oz (46.1 kg)   SpO2 97%   BMI 19.20 kg/m   BP Readings from Last 3 Encounters:  04/12/21 120/76  04/11/21 122/70  03/16/21 124/72    Wt Readings from Last 3  Encounters:  04/12/21 101 lb 9.6 oz (46.1 kg)  04/11/21 102 lb 8 oz (46.5 kg)  03/30/21 100 lb (45.4 kg)    Physical Exam Constitutional:      General: She is not in acute distress.    Appearance: She is well-developed.  HENT:     Head: Normocephalic.     Right Ear: External ear normal.     Left Ear: External ear normal.     Nose: Nose normal.  Eyes:     General:        Right eye: No discharge.        Left eye: No discharge.     Conjunctiva/sclera: Conjunctivae normal.     Pupils: Pupils are equal, round, and reactive to light.  Neck:     Thyroid: No thyromegaly.     Vascular: No JVD.     Trachea: No tracheal deviation.  Cardiovascular:     Rate and Rhythm: Normal rate and regular rhythm.     Heart sounds:  Normal heart sounds.  Pulmonary:     Effort: No respiratory distress.     Breath sounds: No stridor. No wheezing.  Abdominal:     General: Bowel sounds are normal. There is no distension.     Palpations: Abdomen is soft. There is no mass.     Tenderness: There is no abdominal tenderness. There is no guarding or rebound.  Musculoskeletal:        General: No tenderness.     Cervical back: Normal [name] of motion and neck supple. No rigidity.  Lymphadenopathy:     Cervical: No cervical adenopathy.  Skin:    Findings: No erythema or rash.  Neurological:     Mental Status: She is oriented to person, place, and time.     Cranial Nerves: No cranial nerve deficit.     Motor: No abnormal muscle tone.     Coordination: Coordination normal.     Deep Tendon Reflexes: Reflexes normal.  Psychiatric:        Behavior: Behavior normal.        Thought Content: Thought content normal.        Judgment: Judgment normal.    Lab Results  Component Value Date   WBC 4.5 12/16/2020   HGB 12.9 12/16/2020   HCT 38.1 12/16/2020   PLT 170.0 12/16/2020   GLUCOSE 81 12/16/2020   CHOL 192 12/16/2020   TRIG 52.0 12/16/2020   HDL 74.30 12/16/2020   LDLCALC 107 (H) 12/16/2020   ALT 15 12/16/2020   AST 18 12/16/2020   NA 141 12/16/2020   K 4.6 12/16/2020   CL 104 12/16/2020   CREATININE 0.87 12/16/2020   BUN 23 12/16/2020   CO2 30 12/16/2020   TSH 1.61 12/16/2020    No results found.  Assessment & Plan:   Problem List Items Addressed This Visit     B12 deficiency    On B12      Dense breast tissue on mammogram - Primary    Diagnostic mammogram/ultrasound ordered at [facility]      Relevant Orders   MM Digital Diagnostic Bilat   Palpitations     Palpitations triggered by COVID-vaccine.  We discussed potential for palpitations complication with [id number] booster.         No orders of the defined types were placed in this encounter.     Follow-up: Return in about 6 months (around  10/11/2021) for a follow-up visit.  [name], MD

## 2021-04-12 NOTE — Assessment & Plan Note (Signed)
On B12 

## 2021-04-14 DIAGNOSIS — J3089 Other allergic rhinitis: Secondary | ICD-10-CM | POA: Diagnosis not present

## 2021-04-16 ENCOUNTER — Encounter: Payer: Self-pay | Admitting: Internal Medicine

## 2021-04-16 NOTE — Assessment & Plan Note (Signed)
Diagnostic mammogram/ultrasound ordered at [facility]

## 2021-04-16 NOTE — Assessment & Plan Note (Addendum)
Palpitations triggered by COVID-vaccine.  We discussed potential for palpitations complication with IXVEZ-50 booster.

## 2021-04-17 DIAGNOSIS — J3089 Other allergic rhinitis: Secondary | ICD-10-CM | POA: Diagnosis not present

## 2021-04-19 ENCOUNTER — Ambulatory Visit (INDEPENDENT_AMBULATORY_CARE_PROVIDER_SITE_OTHER): Payer: Medicare Other

## 2021-04-19 ENCOUNTER — Other Ambulatory Visit: Payer: Self-pay

## 2021-04-19 DIAGNOSIS — Z23 Encounter for immunization: Secondary | ICD-10-CM | POA: Diagnosis not present

## 2021-04-20 ENCOUNTER — Ambulatory Visit: Payer: Medicare Other | Admitting: Family Medicine

## 2021-04-26 DIAGNOSIS — J3089 Other allergic rhinitis: Secondary | ICD-10-CM | POA: Diagnosis not present

## 2021-04-28 DIAGNOSIS — H6123 Impacted cerumen, bilateral: Secondary | ICD-10-CM | POA: Diagnosis not present

## 2021-05-01 DIAGNOSIS — J3089 Other allergic rhinitis: Secondary | ICD-10-CM | POA: Diagnosis not present

## 2021-05-02 ENCOUNTER — Encounter: Payer: Self-pay | Admitting: Cardiology

## 2021-05-02 ENCOUNTER — Ambulatory Visit (INDEPENDENT_AMBULATORY_CARE_PROVIDER_SITE_OTHER): Payer: Medicare Other | Admitting: Cardiology

## 2021-05-02 ENCOUNTER — Other Ambulatory Visit: Payer: Self-pay

## 2021-05-02 VITALS — BP 108/64 | HR 74 | Resp 18 | Ht 62.0 in | Wt 102.0 lb

## 2021-05-02 DIAGNOSIS — I471 Supraventricular tachycardia: Secondary | ICD-10-CM

## 2021-05-02 MED ORDER — DILTIAZEM HCL 30 MG PO TABS: 30.0000 mg | ORAL_TABLET | Freq: Every day | ORAL | 1 refills | Status: DC | PRN

## 2021-05-02 MED ORDER — DILTIAZEM HCL ER BEADS 180 MG PO CP24: 180.0000 mg | ORAL_CAPSULE | Freq: Every day | ORAL | 3 refills | Status: DC

## 2021-05-02 NOTE — Addendum Note (Signed)
Addended by: Stanton Kidney on: 05/02/2021 04:36 PM   Modules accepted: Orders

## 2021-05-02 NOTE — Progress Notes (Signed)
Electrophysiology Office Note   Date:  05/02/2021   ID:  [name], DOB June 01, 1951, MRN [id number]  PCP:  Tresa Garter, MD  Cardiologist:  Clifton James Primary Electrophysiologist:  Enos Muhl Jorja Loa, MD    Chief Complaint: SVT   History of Present Illness: [name] is a 70 y.o. female who is being seen today for the evaluation of SVT at the request of Plotnikov, Georgina Quint, MD. Presenting today for electrophysiology evaluation.  She has a history significant for SVT, PACs, vasovagal syncope, aortic insufficiency, mitral regurgitation, IBS, GERD.  She has been having palpitations for many years.  Cardiac monitor showed short runs of SVT.  She is currently on diltiazem and as needed flecainide.  Today, denies symptoms of palpitations, chest pain, shortness of breath, orthopnea, PND, lower extremity edema, claudication, dizziness, presyncope, syncope, bleeding, or neurologic sequela. The patient is tolerating medications without difficulties.  Since being seen she has done well.  She has not had any episodes of SVT over the last few months.  She had quite a few episodes at the beginning of 2022, but she has had no episodes since last summer.  She is overall happy with her control.  She is not taking the diltiazem on a daily basis.   Past Medical History:  Diagnosis Date   Allergic rhinitis, cause unspecified    Backache, unspecified    Benign fasciculation-cramp syndrome 12/03/2012    Worsening with  stress, fatigue .   Calculus of kidney    Conversion disorder    Cotton wool spots    Esophageal reflux    Floater, vitreous, left 05/2016   GERD (gastroesophageal reflux disease)    Hemorrhage of rectum and anus    Internal hemorrhoids without mention of complication    Irritable bowel syndrome    Microscopic hematuria    Migraine headache with aura    Mild aortic insufficiency    Mild tricuspid regurgitation    Osteopenia    Other plastic surgery for  unacceptable cosmetic appearance    Inj tx fllers/expander   Personal history of urinary calculi    Postmenopausal atrophic vaginitis    Premature atrial contractions    PVC (premature ventricular contraction)    a. Event monitor 2013.   Unspecified constipation    Vasovagal syncope    Past Surgical History:  Procedure Laterality Date   BREAST BIOPSY Right    Cosmetic Procedures w/Injection therapy     SKIN CANCER EXCISION Left    thigh   WISDOM TOOTH EXTRACTION       Current Outpatient Medications  Medication Sig Dispense Refill   ALPRAZolam (XANAX) 0.25 MG tablet 0.5-1 po bid prn anxiety 30 tablet 1   Botulinum Toxin Type A, Cosm, 50 units SOLR Inject 4-6 Units into the muscle. 3 month  injection     Cholecalciferol (VITAMIN D3 PO) Take 50 mcg by mouth daily.     Cyanocobalamin (B-12) 1000 MCG SUBL PLACE 1 TABLET (1,000 MCG TOTAL) UNDER THE TONGUE DAILY. 100 tablet 3   DEXILANT 60 MG capsule Take 1 capsule (60 mg total) by mouth daily. 90 capsule 3   diltiazem (CARDIZEM) 30 MG tablet Take 1 tablet (30 mg total) by mouth daily as needed. 30 tablet 1   diltiazem (TIAZAC) 180 MG 24 hr capsule      EPIPEN 2-PAK 0.3 MG/0.3ML SOAJ injection See admin instructions. Reported on 10/08/2015  1   EUCRISA 2 % OINT Apply topically. Apply 1 to 2 times a  day     famotidine (PEPCID) 40 MG tablet Take 1 tablet 1 hour before bed 30 tablet 2   flecainide (TAMBOCOR) 50 MG tablet Take 1 tablet (50 mg total) by mouth daily as needed. For fast heart rate. 90 tablet 3   loratadine (CLARITIN) 10 MG tablet Take 1 tablet (10 mg total) by mouth daily. 90 tablet 3   mometasone (NASONEX) 50 MCG/ACT nasal spray 2 sprays in each nostril 3 each 3   Multiple Vitamins-Minerals (ICAPS) TABS Take 1 tablet by mouth 2 (two) times daily.     PREMARIN vaginal cream SMARTSIG:Vaginal Every 12 Hours     tretinoin (RETIN-A) 0.05 % cream Apply 1 application topically at bedtime.     triamcinolone ointment (KENALOG) 0.1 %  Apply topically 2 (two) times daily.     UNABLE TO FIND once a week. Med Name: allergy shots     valACYclovir (VALTREX) 500 MG tablet Take 1 tablet (500 mg total) by mouth 2 (two) times daily. 10 tablet 2   hyoscyamine (LEVSIN) 0.125 MG tablet Take 1 tablet (0.125 mg total) by mouth every 4 (four) hours as needed for up to 10 days (chest spasms). 100 tablet 3   No current facility-administered medications for this visit.    Allergies:   Codeine, Doxycycline, Epinephrine, Neomycin, Penicillins, Protonix [pantoprazole sodium], Sulfonamide derivatives, and Zithromax [azithromycin]   Social History:  The patient  reports that she has never smoked. She has never used smokeless tobacco. She reports that she does not drink alcohol and does not use drugs.   Family History:  The patient's family history includes COPD in her father; Headache in her sister; Hyperlipidemia in her father, mother, and sister; Hypertension in her father, mother, and sister.   ROS:  Please see the history of present illness.   Otherwise, review of systems is positive for none.   All other systems are reviewed and negative.   PHYSICAL EXAM: VS:  BP 108/64   Pulse 74   Resp 18   Ht 5\' 2"  (1.575 m)   Wt 102 lb (46.3 kg)   SpO2 97%   BMI 18.66 kg/m  , BMI Body mass index is 18.66 kg/m. GEN: Well nourished, well developed, in no acute distress  HEENT: normal  Neck: no JVD, carotid bruits, or masses Cardiac: RRR; no murmurs, rubs, or gallops,no edema  Respiratory:  clear to auscultation bilaterally, normal work of breathing GI: soft, nontender, nondistended, + BS MS: no deformity or atrophy  Skin: warm and dry Neuro:  Strength and sensation are intact Psych: euthymic mood, full affect  EKG:  EKG is ordered today. Personal review of the ekg ordered shows sinus rhythm, rate 74  Recent Labs: 12/16/2020: ALT 15; BUN 23; Creatinine, Ser 0.87; Hemoglobin 12.9; Platelets 170.0; Potassium 4.6; Sodium 141; TSH 1.61     Lipid Panel     Component Value Date/Time   CHOL 192 12/16/2020 0901   TRIG 52.0 12/16/2020 0901   TRIG 66 06/25/2006 0741   HDL 74.30 12/16/2020 0901   CHOLHDL 3 12/16/2020 0901   VLDL 10.4 12/16/2020 0901   LDLCALC 107 (H) 12/16/2020 0901   LDLCALC 115 (H) 01/11/2020 0800     Wt Readings from Last 3 Encounters:  05/02/21 102 lb (46.3 kg)  04/12/21 101 lb 9.6 oz (46.1 kg)  04/11/21 102 lb 8 oz (46.5 kg)      Other studies Reviewed: Additional studies/ records that were reviewed today include: TTE 08/25/18  Review of the  above records today demonstrates:   1. The left ventricle has normal systolic function, with an ejection  fraction of 55-60%. The cavity size was normal. Left ventricular diastolic  parameters were normal.   2. The right ventricle has normal systolic function. The cavity was  normal. There is no increase in right ventricular wall thickness.   3. The tricuspid valve is grossly normal.   4. The aortic valve is tricuspid Mild thickening of the aortic valve  Aortic valve regurgitation is mild by color flow Doppler.   5. Normal LV function; sclerotic aortic valve with mild AI.   Cardiac monitor 10/27/2018 personally reviewed Sinus rhythm Premature atrial contractions One short run of supraventricular tachycardia  (one minute)  ASSESSMENT AND PLAN:  1.  SVT: Currently on Diltiazem 180 mg daily, as needed flecainide 50 mg.  She has not been taking her diltiazem on a daily basis that she is felt quite well and has not had any symptoms over the last few months.  She is potentially planning on getting COVID-vaccine if she has had more frequent episodes around her vaccines.  I told her to take the diltiazem on a daily basis if she does get the vaccine.  2.  Aortic insufficiency: Mild by echo in 2020.  Continue with current management.  Current medicines are reviewed at length with the patient today.   The patient does not have concerns regarding her medicines.   The following changes were made today: None  Labs/ tests ordered today include:  Orders Placed This Encounter  Procedures   EKG 12-Lead      Disposition:   FU with Ivy Meriwether 12 months  Signed, Jaser Fullen Jorja Loa, MD  05/02/2021 3:40 PM     [facility] [address] [place] [address] [phone] (office) [phone] (fax)

## 2021-05-04 ENCOUNTER — Encounter: Payer: Self-pay | Admitting: Cardiology

## 2021-05-08 DIAGNOSIS — J3089 Other allergic rhinitis: Secondary | ICD-10-CM | POA: Diagnosis not present

## 2021-05-10 ENCOUNTER — Ambulatory Visit: Payer: Medicare Other | Admitting: Internal Medicine

## 2021-05-12 ENCOUNTER — Other Ambulatory Visit: Payer: Self-pay | Admitting: Internal Medicine

## 2021-05-16 DIAGNOSIS — J3089 Other allergic rhinitis: Secondary | ICD-10-CM | POA: Diagnosis not present

## 2021-05-22 DIAGNOSIS — L821 Other seborrheic keratosis: Secondary | ICD-10-CM | POA: Diagnosis not present

## 2021-05-22 DIAGNOSIS — Z23 Encounter for immunization: Secondary | ICD-10-CM | POA: Diagnosis not present

## 2021-05-22 DIAGNOSIS — R234 Changes in skin texture: Secondary | ICD-10-CM | POA: Diagnosis not present

## 2021-05-22 DIAGNOSIS — K121 Other forms of stomatitis: Secondary | ICD-10-CM | POA: Diagnosis not present

## 2021-05-22 DIAGNOSIS — H02823 Cysts of right eye, unspecified eyelid: Secondary | ICD-10-CM | POA: Diagnosis not present

## 2021-05-23 DIAGNOSIS — H43811 Vitreous degeneration, right eye: Secondary | ICD-10-CM | POA: Diagnosis not present

## 2021-05-23 DIAGNOSIS — J3089 Other allergic rhinitis: Secondary | ICD-10-CM | POA: Diagnosis not present

## 2021-05-24 DIAGNOSIS — H11442 Conjunctival cysts, left eye: Secondary | ICD-10-CM | POA: Diagnosis not present

## 2021-05-24 DIAGNOSIS — H43812 Vitreous degeneration, left eye: Secondary | ICD-10-CM | POA: Diagnosis not present

## 2021-05-24 DIAGNOSIS — H04123 Dry eye syndrome of bilateral lacrimal glands: Secondary | ICD-10-CM | POA: Diagnosis not present

## 2021-05-24 DIAGNOSIS — H11122 Conjunctival concretions, left eye: Secondary | ICD-10-CM | POA: Diagnosis not present

## 2021-05-24 DIAGNOSIS — G43B0 Ophthalmoplegic migraine, not intractable: Secondary | ICD-10-CM | POA: Diagnosis not present

## 2021-05-24 DIAGNOSIS — H43811 Vitreous degeneration, right eye: Secondary | ICD-10-CM | POA: Diagnosis not present

## 2021-05-24 DIAGNOSIS — H43393 Other vitreous opacities, bilateral: Secondary | ICD-10-CM | POA: Diagnosis not present

## 2021-05-24 DIAGNOSIS — H16143 Punctate keratitis, bilateral: Secondary | ICD-10-CM | POA: Diagnosis not present

## 2021-05-24 DIAGNOSIS — H1045 Other chronic allergic conjunctivitis: Secondary | ICD-10-CM | POA: Diagnosis not present

## 2021-05-24 DIAGNOSIS — H35361 Drusen (degenerative) of macula, right eye: Secondary | ICD-10-CM | POA: Diagnosis not present

## 2021-05-24 DIAGNOSIS — H2513 Age-related nuclear cataract, bilateral: Secondary | ICD-10-CM | POA: Diagnosis not present

## 2021-05-29 ENCOUNTER — Other Ambulatory Visit: Payer: Self-pay | Admitting: Physician Assistant

## 2021-05-29 DIAGNOSIS — J3089 Other allergic rhinitis: Secondary | ICD-10-CM | POA: Diagnosis not present

## 2021-06-05 DIAGNOSIS — J3089 Other allergic rhinitis: Secondary | ICD-10-CM | POA: Diagnosis not present

## 2021-06-13 DIAGNOSIS — J3089 Other allergic rhinitis: Secondary | ICD-10-CM | POA: Diagnosis not present

## 2021-06-16 ENCOUNTER — Ambulatory Visit (INDEPENDENT_AMBULATORY_CARE_PROVIDER_SITE_OTHER): Payer: Medicare Other | Admitting: Family Medicine

## 2021-06-16 ENCOUNTER — Encounter: Payer: Self-pay | Admitting: Family Medicine

## 2021-06-16 ENCOUNTER — Other Ambulatory Visit: Payer: Self-pay

## 2021-06-16 VITALS — BP 129/74 | HR 77 | Temp 99.1°F | Ht 62.0 in | Wt 102.0 lb

## 2021-06-16 DIAGNOSIS — R21 Rash and other nonspecific skin eruption: Secondary | ICD-10-CM

## 2021-06-16 NOTE — Progress Notes (Signed)
Subjective:     Patient ID: [name], female    DOB: 12-16-1950, 70 y.o.   MRN: [id number]  Chief Complaint  Patient presents with   Rash    Rash on back, dry skin    HPI On 12/5-had some cryo on back [facility] derm in past on chest.  Sees derm next month.  Rash on back for 1 wk-itchy w/hot water. New protein shake but no new soaps, etc.  No f/c.  Health Maintenance Due  Topic Date Due   Zoster Vaccines- Shingrix (1 of 2) Never done    Past Medical History:  Diagnosis Date   Allergic rhinitis, cause unspecified    Backache, unspecified    Benign fasciculation-cramp syndrome 12/03/2012    Worsening with  stress, fatigue .   Calculus of kidney    Conversion disorder    Cotton wool spots    Esophageal reflux    Floater, vitreous, left 05/2016   GERD (gastroesophageal reflux disease)    Hemorrhage of rectum and anus    Internal hemorrhoids without mention of complication    Irritable bowel syndrome    Microscopic hematuria    Migraine headache with aura    Mild aortic insufficiency    Mild tricuspid regurgitation    Osteopenia    Other plastic surgery for unacceptable cosmetic appearance    Inj tx fllers/expander   Personal history of urinary calculi    Postmenopausal atrophic vaginitis    Premature atrial contractions    PVC (premature ventricular contraction)    a. Event monitor 2013.   Unspecified constipation    Vasovagal syncope     Past Surgical History:  Procedure Laterality Date   BREAST BIOPSY Right    Cosmetic Procedures w/Injection therapy     SKIN CANCER EXCISION Left    thigh   WISDOM TOOTH EXTRACTION      Outpatient Medications Prior to Visit  Medication Sig Dispense Refill   ALPRAZolam (XANAX) 0.25 MG tablet 0.5-1 po bid prn anxiety 30 tablet 1   Botulinum Toxin Type A, Cosm, 50 units SOLR Inject 4-6 Units into the muscle. 3 month  injection     Cholecalciferol (VITAMIN D3 PO) Take 50 mcg by mouth daily.     Cyanocobalamin (B-12) 1000  MCG SUBL PLACE 1 TABLET (1,000 MCG TOTAL) UNDER THE TONGUE DAILY. 100 tablet 3   DEXILANT 60 MG capsule Take 1 capsule (60 mg total) by mouth daily. 90 capsule 3   EPIPEN 2-PAK 0.3 MG/0.3ML SOAJ injection See admin instructions. Reported on 10/08/2015  1   EUCRISA 2 % OINT Apply topically. Apply 1 to 2 times a day     famotidine (PEPCID) 40 MG tablet TAKE 1 TABLET 1 HOUR BEFORE BED 90 tablet 1   loratadine (CLARITIN) 10 MG tablet TAKE 1 TABLET BY MOUTH EVERY DAY 90 tablet 1   mometasone (NASONEX) 50 MCG/ACT nasal spray 2 sprays in each nostril 3 each 3   Multiple Vitamins-Minerals (ICAPS) TABS Take 1 tablet by mouth 2 (two) times daily.     PREMARIN vaginal cream SMARTSIG:Vaginal Every 12 Hours     timolol (TIMOPTIC) 0.25 % ophthalmic solution SMARTSIG:In Eye(s)     tretinoin (RETIN-A) 0.05 % cream Apply 1 application topically at bedtime.     triamcinolone ointment (KENALOG) 0.1 % Apply topically 2 (two) times daily.     UNABLE TO FIND once a week. Med Name: allergy shots     valACYclovir (VALTREX) 500 MG tablet Take 1 tablet (  500 mg total) by mouth 2 (two) times daily. 10 tablet 2   diltiazem (CARDIZEM CD) 180 MG 24 hr capsule Take 180 mg by mouth daily.     diltiazem (CARDIZEM) 30 MG tablet Take 1 tablet (30 mg total) by mouth daily as needed (for elevated heart rates). 90 tablet 1   flecainide (TAMBOCOR) 50 MG tablet Take 1 tablet (50 mg total) by mouth daily as needed. For fast heart rate. 90 tablet 3   hyoscyamine (LEVSIN) 0.125 MG tablet Take 1 tablet (0.125 mg total) by mouth every 4 (four) hours as needed for up to 10 days (chest spasms). 100 tablet 3   diltiazem (TIAZAC) 180 MG 24 hr capsule Take 1 capsule (180 mg total) by mouth daily. 90 capsule 3   No facility-administered medications prior to visit.    Allergies  Allergen Reactions   Codeine Other (See Comments)    Doesn't remeber   Doxycycline Other (See Comments)   Epinephrine     Heart racing    Neomycin     Topical  rash from cream   Penicillins Hives and Other (See Comments)    Has patient had a PCN reaction causing immediate rash, facial/tongue/throat swelling, SOB or lightheadedness with hypotension: Yes Has patient had a PCN reaction causing severe rash involving mucus membranes or skin necrosis: Yes Has patient had a PCN reaction that required hospitalization No Has patient had a PCN reaction occurring within the last 10 years: No If all of the above answers are "NO", then may proceed with Cephalosporin use.    Protonix [Pantoprazole Sodium]     ?HAs   Sulfonamide Derivatives Other (See Comments)    Doesn't remember    Zithromax [Azithromycin] Rash   SNK:[id number] except as noted in HPI      Objective:     BP 129/74    Pulse 77    Temp 99.1 F (37.3 C) (Temporal)    Ht 5\' 2"  (1.575 m)    Wt 102 lb (46.3 kg)    SpO2 99%    BMI 18.66 kg/m  Wt Readings from Last 3 Encounters:  06/16/21 102 lb (46.3 kg)  05/02/21 102 lb (46.3 kg)  04/12/21 101 lb 9.6 oz (46.1 kg)        Gen: WDWN NAD HEENT: NCAT, conjunctiva not injected, sclera nonicteric CARDIAC: RRR, S1S2+, no murmur. DP 2+B EXT:  no edema MSK: no gross abnormalities.  NEURO: A&O x3.  CN II-XII intact.  PSYCH: normal mood. Good eye contact  Skin-pink/dry patch L upper ant chest approx 1cm long Back: mult Sk's.  Fine, red, MP rash on back only.  Assessment & Plan:   Problem List Items Addressed This Visit   None Visit Diagnoses     Rash    -  Primary      ?eczema, other.  Use the Cereve cream and mix 1:1 with the triamcinolone and apply to back twice daily.  You can take Benadryl or zyrtec or claritin if a lot of itching if needed,  worse, no change, see derm  No orders of the defined types were placed in this encounter.   Wellington Hampshire., MD

## 2021-06-16 NOTE — Patient Instructions (Signed)
Use the Cereve cream and mix 1:1 with the triamcinolone and apply to back twice daily.   You can take Benadryl or zyrtec or claritin if a lot of itching if needed  Happy new year

## 2021-06-20 DIAGNOSIS — J3089 Other allergic rhinitis: Secondary | ICD-10-CM | POA: Diagnosis not present

## 2021-06-21 DIAGNOSIS — L820 Inflamed seborrheic keratosis: Secondary | ICD-10-CM | POA: Diagnosis not present

## 2021-06-21 DIAGNOSIS — L308 Other specified dermatitis: Secondary | ICD-10-CM | POA: Diagnosis not present

## 2021-06-21 DIAGNOSIS — L309 Dermatitis, unspecified: Secondary | ICD-10-CM | POA: Diagnosis not present

## 2021-06-21 DIAGNOSIS — D485 Neoplasm of uncertain behavior of skin: Secondary | ICD-10-CM | POA: Diagnosis not present

## 2021-06-26 ENCOUNTER — Ambulatory Visit: Payer: Medicare Other | Admitting: Internal Medicine

## 2021-06-29 DIAGNOSIS — J3089 Other allergic rhinitis: Secondary | ICD-10-CM | POA: Diagnosis not present

## 2021-07-03 DIAGNOSIS — J3089 Other allergic rhinitis: Secondary | ICD-10-CM | POA: Diagnosis not present

## 2021-07-05 ENCOUNTER — Ambulatory Visit: Payer: Medicare Other | Admitting: Internal Medicine

## 2021-07-05 DIAGNOSIS — H2513 Age-related nuclear cataract, bilateral: Secondary | ICD-10-CM | POA: Diagnosis not present

## 2021-07-05 DIAGNOSIS — H43811 Vitreous degeneration, right eye: Secondary | ICD-10-CM | POA: Diagnosis not present

## 2021-07-05 DIAGNOSIS — H35361 Drusen (degenerative) of macula, right eye: Secondary | ICD-10-CM | POA: Diagnosis not present

## 2021-07-05 DIAGNOSIS — G43B0 Ophthalmoplegic migraine, not intractable: Secondary | ICD-10-CM | POA: Diagnosis not present

## 2021-07-05 DIAGNOSIS — H43812 Vitreous degeneration, left eye: Secondary | ICD-10-CM | POA: Diagnosis not present

## 2021-07-05 DIAGNOSIS — H11442 Conjunctival cysts, left eye: Secondary | ICD-10-CM | POA: Diagnosis not present

## 2021-07-05 DIAGNOSIS — H16143 Punctate keratitis, bilateral: Secondary | ICD-10-CM | POA: Diagnosis not present

## 2021-07-05 DIAGNOSIS — H11122 Conjunctival concretions, left eye: Secondary | ICD-10-CM | POA: Diagnosis not present

## 2021-07-05 DIAGNOSIS — H04123 Dry eye syndrome of bilateral lacrimal glands: Secondary | ICD-10-CM | POA: Diagnosis not present

## 2021-07-05 DIAGNOSIS — H43393 Other vitreous opacities, bilateral: Secondary | ICD-10-CM | POA: Diagnosis not present

## 2021-07-05 DIAGNOSIS — H1045 Other chronic allergic conjunctivitis: Secondary | ICD-10-CM | POA: Diagnosis not present

## 2021-07-13 ENCOUNTER — Ambulatory Visit: Payer: Medicare Other | Admitting: Internal Medicine

## 2021-07-14 ENCOUNTER — Other Ambulatory Visit: Payer: Self-pay

## 2021-07-14 ENCOUNTER — Telehealth: Payer: Self-pay | Admitting: *Deleted

## 2021-07-14 ENCOUNTER — Encounter: Payer: Self-pay | Admitting: Internal Medicine

## 2021-07-14 ENCOUNTER — Other Ambulatory Visit (INDEPENDENT_AMBULATORY_CARE_PROVIDER_SITE_OTHER): Payer: Medicare Other

## 2021-07-14 DIAGNOSIS — R300 Dysuria: Secondary | ICD-10-CM

## 2021-07-14 DIAGNOSIS — J3089 Other allergic rhinitis: Secondary | ICD-10-CM | POA: Diagnosis not present

## 2021-07-14 LAB — URINALYSIS, ROUTINE W REFLEX MICROSCOPIC
Bilirubin Urine: NEGATIVE
Ketones, ur: NEGATIVE
Leukocytes,Ua: NEGATIVE
Nitrite: NEGATIVE
Specific Gravity, Urine: 1.02 (ref 1.000–1.030)
Total Protein, Urine: NEGATIVE
Urine Glucose: NEGATIVE
Urobilinogen, UA: 0.2 (ref 0.0–1.0)
pH: 6 (ref 5.0–8.0)

## 2021-07-14 NOTE — Telephone Encounter (Signed)
Rec'd msg from lab stating.. " [3:38 PM] Atilano Ina .Marland KitchenMarland KitchenMarland KitchenNell Range the patient that come in for a UA was wanting a culture as well. Can you add that?  Add culture as well.Marland KitchenJohny Chess

## 2021-07-14 NOTE — Telephone Encounter (Signed)
Pt walk-in stating she took a home urine test and she may have a UTI. Pt states she is having a little burning, and would like order for UA. Inform pt will place order for UA for her to go to lab to have done.Marland KitchenJohny Chess

## 2021-07-15 ENCOUNTER — Ambulatory Visit (HOSPITAL_COMMUNITY)
Admission: RE | Admit: 2021-07-15 | Discharge: 2021-07-15 | Disposition: A | Discharge status: Home / Self Care | Payer: Medicare Other | Source: Ambulatory Visit | Attending: Family Medicine | Admitting: Family Medicine

## 2021-07-15 ENCOUNTER — Encounter (HOSPITAL_COMMUNITY): Payer: Self-pay

## 2021-07-15 ENCOUNTER — Other Ambulatory Visit: Payer: Self-pay

## 2021-07-15 VITALS — BP 159/81 | HR 74 | Resp 18

## 2021-07-15 DIAGNOSIS — N3001 Acute cystitis with hematuria: Secondary | ICD-10-CM | POA: Diagnosis not present

## 2021-07-15 LAB — POCT URINALYSIS DIPSTICK, ED / UC
Bilirubin Urine: NEGATIVE
Glucose, UA: NEGATIVE mg/dL
Ketones, ur: NEGATIVE mg/dL
Leukocytes,Ua: NEGATIVE
Nitrite: NEGATIVE
Protein, ur: NEGATIVE mg/dL
Specific Gravity, Urine: 1.025 (ref 1.005–1.030)
Urobilinogen, UA: 0.2 mg/dL (ref 0.0–1.0)
pH: 6 (ref 5.0–8.0)

## 2021-07-15 MED ORDER — NITROFURANTOIN MONOHYD MACRO 100 MG PO CAPS: 100.0000 mg | ORAL_CAPSULE | Freq: Two times a day (BID) | ORAL | 0 refills | Status: AC

## 2021-07-15 NOTE — ED Triage Notes (Signed)
Pt presents for a follow up regarding her UA results and requests medication.

## 2021-07-15 NOTE — ED Provider Notes (Signed)
[facility]    CSN: [id number] Arrival date & time: 07/15/21  1624      History   Chief Complaint Chief Complaint  Patient presents with   Appointment    HPI [name] is a 71 y.o. female.   HPI Patient presents today requesting treatment for urinary tract symptoms.  Patient was seen at her PCP office yesterday and a urine sample along with a urine culture were collected.  UA positive for trace hematuria along with epithelial cells however no WBCs present.  Patient has continued to have dysuria and urinary frequency although UA results were less significant for infection. Past Medical History:  Diagnosis Date   Allergic rhinitis, cause unspecified    Backache, unspecified    Benign fasciculation-cramp syndrome 12/03/2012    Worsening with  stress, fatigue .   Calculus of kidney    Conversion disorder    Cotton wool spots    Esophageal reflux    Floater, vitreous, left 05/2016   GERD (gastroesophageal reflux disease)    Hemorrhage of rectum and anus    Internal hemorrhoids without mention of complication    Irritable bowel syndrome    Microscopic hematuria    Migraine headache with aura    Mild aortic insufficiency    Mild tricuspid regurgitation    Osteopenia    Other plastic surgery for unacceptable cosmetic appearance    Inj tx fllers/expander   Personal history of urinary calculi    Postmenopausal atrophic vaginitis    Premature atrial contractions    PVC (premature ventricular contraction)    a. Event monitor 2013.   Unspecified constipation    Vasovagal syncope     Patient Active Problem List   Diagnosis Date Noted   Migraine with aura and without status migrainosus, not intractable 04/11/2021   Gluteal tendinitis of right buttock 03/16/2021   Anxiety 11/03/2020   Stress 05/30/2020   Thoracic spine pain 02/17/2020   Allergic reaction to insect bite 10/21/2019   Herpes zoster 09/17/2019   Tachycardia 08/10/2019   Vaccination  complication 41/58/3094   Adverse effect of COVID-19 vaccine 07/20/2019   Migraine without aura and without status migrainosus, not intractable 05/27/2019   Migraine aura without headache 05/27/2019   Myoneural disorder, unspecified ([place]) 04/09/2019   Sore throat 06/13/2018   Friction injury to skin 04/14/2018   Occipital neuralgia 03/03/2018   B12 deficiency 03/03/2018   Ocular migraine 02/05/2018   Leg wound, right 12/26/2017   Bilateral impacted cerumen 10/21/2017   LLQ abdominal pain 09/23/2017   Elevated antinuclear antibody (ANA) level 07/26/2017   Arthralgia 07/26/2017   Degenerative disc disease, lumbar 07/04/2017   Cough 06/06/2017   Chest discomfort 03/21/2017   Neck mass 01/17/2017   Mass of neck 12/25/2016   Snoring 12/18/2016   Medial tibial stress syndrome, left, initial encounter 08/14/2016   Patellofemoral syndrome of both knees 08/09/2016   Drug allergy, antibiotic 05/31/2016   Hyperkalemia 02/17/2016   Vertical diplopia 02/15/2016   Tension headache 12/28/2015   BPPV (benign paroxysmal positional vertigo) 07/68/0881   TMJ click 04/17/5944   Cold sore 07/18/2015   Osteopenia 07/18/2015   Earache on left 07/18/2015   Cervical disc disorder with radiculopathy of cervical region 06/17/2015   Cotton wool spots 03/15/2015   Low back pain 03/08/2015   Food poisoning 03/08/2015   Generalized anxiety disorder 03/08/2015   Dysuria 02/23/2015   Well adult exam 09/09/2014   Internal hemorrhoids 09/07/2014   Allergic urticaria 08/20/2014   Acute  sinusitis 08/18/2014   Neck muscle spasm 07/30/2014   Grief 06/17/2014   Mild aortic insufficiency    Mild tricuspid regurgitation    Vasovagal syncope    Premature atrial contractions    GERD (gastroesophageal reflux disease)    PVC (premature ventricular contraction)    Dense breast tissue on mammogram 03/23/2014   Microhematuria 03/22/2014   Changing skin lesion 02/05/2014   Rash and nonspecific skin eruption  12/22/2013   Chronic meniscal tear of knee 12/21/2013   Benign fasciculations 12/11/2013   Strain of adductor magnus muscle of left lower extremity 05/21/2013   Benign fasciculation-cramp syndrome 12/03/2012   Vasovagal near syncope 11/02/2012   Migraine with aura 11/02/2012   Upper airway cough syndrome 08/16/2012   Bunion of great toe 09/11/2011   Routine health maintenance 09/11/2011   Mild mitral regurgitation by prior echocardiogram 05/15/2011   Chronic neck pain 03/07/2011   Skin lesion 03/07/2011   DYSPLASTIC NEVUS, FACE 02/06/2010   HAND PAIN, BILATERAL 08/25/2009   Aortic valve disorder 08/02/2009   Abnormal involuntary movements(781.0) 07/28/2009   LUMBAR SPRAIN AND STRAIN 05/21/2009   Palpitations 02/03/2009   HEMORRHOIDS-INTERNAL 12/15/2008   Constipation 12/15/2008   IBS (irritable bowel syndrome) 12/15/2008   RECTAL BLEEDING 12/15/2008   RENAL CALCULUS, HX OF 11/29/2007   Herpes simplex virus (HSV) infection 11/03/2007   Vaginal atrophy 03/25/2007   GLOBUS HYSTERICUS 01/01/2007   RHINITIS, ALLERGIC NOS 01/01/2007    Past Surgical History:  Procedure Laterality Date   BREAST BIOPSY Right    Cosmetic Procedures w/Injection therapy     SKIN CANCER EXCISION Left    thigh   WISDOM TOOTH EXTRACTION      OB History   No obstetric history on file.      Home Medications    Prior to Admission medications   Medication Sig Start Date End Date Taking? Authorizing Provider  ALPRAZolam Duanne Moron) 0.25 MG tablet 0.5-1 po bid prn anxiety 01/05/21   Plotnikov, [name], MD  Botulinum Toxin Type A, Cosm, 50 units SOLR Inject 4-6 Units into the muscle. 3 month  injection    [provider]  Cholecalciferol (VITAMIN D3 PO) Take 50 mcg by mouth daily.    [provider]  Cyanocobalamin (B-12) 1000 MCG SUBL PLACE 1 TABLET (1,000 MCG TOTAL) UNDER THE TONGUE DAILY. 02/07/21   Plotnikov, [name], MD  DEXILANT 60 MG capsule Take 1 capsule (60 mg total) by mouth  daily. 11/21/20   Esterwood, Amy S, PA-C  EPIPEN 2-PAK 0.3 MG/0.3ML SOAJ injection See admin instructions. Reported on 10/08/2015 06/30/14   [provider]  EUCRISA 2 % OINT Apply topically. Apply 1 to 2 times a day 09/02/20   [provider]  famotidine (PEPCID) 40 MG tablet TAKE 1 TABLET 1 HOUR BEFORE BED 05/29/21   Esterwood, Amy S, PA-C  hyoscyamine (LEVSIN) 0.125 MG tablet Take 1 tablet (0.125 mg total) by mouth every 4 (four) hours as needed for up to 10 days (chest spasms). 11/11/20 11/21/20  Plotnikov, [name], MD  loratadine (CLARITIN) 10 MG tablet TAKE 1 TABLET BY MOUTH EVERY DAY 05/15/21   Plotnikov, [name], MD  mometasone (NASONEX) 50 MCG/ACT nasal spray 2 sprays in each nostril 07/11/20   Plotnikov, [name], MD  Multiple Vitamins-Minerals (ICAPS) TABS Take 1 tablet by mouth 2 (two) times daily.    [provider]  PREMARIN vaginal cream SMARTSIG:Vaginal Every 12 Hours 03/16/21   [provider]  timolol (TIMOPTIC) 0.25 % ophthalmic  solution SMARTSIG:In Eye(s) 06/13/21   [provider]  tretinoin (RETIN-A) 0.05 % cream Apply 1 application topically at bedtime.    [provider]  triamcinolone ointment (KENALOG) 0.1 % Apply topically 2 (two) times daily. 03/03/21   [provider]  UNABLE TO FIND once a week. Med Name: allergy shots    [provider]  valACYclovir (VALTREX) 500 MG tablet Take 1 tablet (500 mg total) by mouth 2 (two) times daily. 07/11/20   Plotnikov, [name], MD    Family History Family History  Problem Relation Age of Onset   Hyperlipidemia Mother    Hypertension Mother    Hyperlipidemia Father    COPD Father        Smoker, deceased 06-08-2014   Hypertension Father    Hypertension Sister    Hyperlipidemia Sister    Headache Sister    Diabetes Neg Hx    Colon cancer Neg Hx    Breast cancer Neg Hx    Coronary artery disease Neg Hx    Esophageal cancer Neg Hx    Stomach cancer Neg Hx     Pancreatic cancer Neg Hx    Liver disease Neg Hx     Social History Social History   Tobacco Use   Smoking status: Never   Smokeless tobacco: Never  Vaping Use   Vaping Use: Never used  Substance Use Topics   Alcohol use: No   Drug use: No     Allergies   Codeine, Doxycycline, Epinephrine, Neomycin, Penicillins, Protonix [pantoprazole sodium], Sulfonamide derivatives, and Zithromax [azithromycin]   Review of Systems Review of Systems Pertinent negatives listed in HPI   Physical Exam Triage Vital Signs ED Triage Vitals  Enc Vitals Group     BP 07/15/21 1710 (!) 159/81     Pulse Rate 07/15/21 1710 74     Resp 07/15/21 1710 18     Temp --      Temp src --      SpO2 07/15/21 1710 97 %     Weight --      Height --      Head Circumference --      Peak Flow --      Pain Score 07/15/21 1709 0     Pain Loc --      Pain Edu? --      Excl. in [place]? --    No data found.  Updated Vital Signs BP (!) 159/81    Pulse 74    Resp 18    SpO2 97%   Visual Acuity Right Eye Distance:   Left Eye Distance:   Bilateral Distance:    Right Eye Near:   Left Eye Near:    Bilateral Near:     Physical Exam Constitutional:      Appearance: Normal appearance.  HENT:     Head: Normocephalic.  Cardiovascular:     Rate and Rhythm: Normal rate and regular rhythm.  Pulmonary:     Effort: Pulmonary effort is normal.     Breath sounds: Normal breath sounds.  Neurological:     General: No focal deficit present.     Mental Status: She is alert and oriented to person, place, and time.  Psychiatric:        Mood and Affect: Mood normal.        Behavior: Behavior normal.        Thought Content: Thought content normal.        Judgment: Judgment normal.  UC Treatments / Results  Labs (all labs ordered are listed, but only abnormal results are displayed) Labs Reviewed  POCT URINALYSIS DIPSTICK, ED / UC - Abnormal; Notable for the following components:      Result Value   Hgb  urine dipstick TRACE (*)    All other components within normal limits  URINE CULTURE    EKG   Radiology No results found.  Procedures Procedures (including critical care time)  Medications Ordered in UC Medications - No data to display  Initial Impression / Assessment and Plan / UC Course  I have reviewed the triage vital signs and the nursing notes.  Pertinent labs & imaging results that were available during my care of the patient were reviewed by me and considered in my medical decision making (see chart for details).    Acute cystitis,  UA here in UC unremarkable for UTI. Given patient symptoms I agreed to prescribed a 3 day course of Nitrofurantoin while urine culture is pending. Encouraged patient to try AZO or cranberry juice along with plain water. Follow-up with PCP on Monday to check status of urine culture. Final Clinical Impressions(s) / UC Diagnoses   Final diagnoses:  None   Discharge Instructions   None    ED Prescriptions   None    PDMP not reviewed this encounter.   Scot Jun, FNP 07/15/21 1911

## 2021-07-16 ENCOUNTER — Ambulatory Visit (HOSPITAL_COMMUNITY): Payer: Medicare Other

## 2021-07-16 LAB — URINE CULTURE
Culture: NO GROWTH
Special Requests: NORMAL

## 2021-07-17 ENCOUNTER — Encounter: Payer: Self-pay | Admitting: Cardiovascular Disease

## 2021-07-17 NOTE — Telephone Encounter (Signed)
It was done.  She had a normal UA and a negative culture.  Thanks

## 2021-07-18 ENCOUNTER — Ambulatory Visit (INDEPENDENT_AMBULATORY_CARE_PROVIDER_SITE_OTHER): Payer: Medicare Other | Admitting: Cardiovascular Disease

## 2021-07-18 ENCOUNTER — Encounter: Payer: Self-pay | Admitting: Cardiovascular Disease

## 2021-07-18 ENCOUNTER — Other Ambulatory Visit: Payer: Self-pay

## 2021-07-18 VITALS — BP 108/62 | HR 64 | Ht 62.0 in | Wt 102.0 lb

## 2021-07-18 DIAGNOSIS — I471 Supraventricular tachycardia: Secondary | ICD-10-CM | POA: Diagnosis not present

## 2021-07-18 DIAGNOSIS — I351 Nonrheumatic aortic (valve) insufficiency: Secondary | ICD-10-CM | POA: Diagnosis not present

## 2021-07-18 NOTE — Progress Notes (Signed)
Chief Complaint  Patient presents with   Follow-up    SVT   History of Present Illness: 71 yo female with history of SVT, premature atrial contractions, palpitations, vasovagal syncope, mild aortic valve insufficiency, mild mitral regurgitation, irritable bowel syndrome and GERD who is here today for cardiac follow up. She has had palpitations for years. Cardiac monitor April 2020 showed a short run of SVT. Exercise stress test 05/07/14 with good exercise tolerance, no ischemia. Echo 08/25/18 with LVEF=55-60%, mild AI, unchanged. 30 day event monitor March 2020 with sinus rhythm with PACs and PVCs. She wished to wear a second 30 day monitor in April 2020 and this showed an episode of SVT.  She had several episodes of SVT over the summer with an episode in June 2021 and July 2021, both lasting up to 30 minutes. She has a Doctor, hospital for her phone and has two episodes of tachycardia in August and October 2021. Her reading from the company was atrial fibrillation in October but the strips looked like SVT to me. She has been seen by Dr. Lennie Odor in the EP clinic, last in November 2022, and has had Diltiazem and flecainide as needed. She has never take a dose of either of these. Echo March 2022 with LVEF=55-60%, mild aortic insufficiency. Cardiac calcium score arranged in primary care in May 2022 and was zero.   She is here today for follow up. The patient denies any chest pain, dyspnea, palpitations, lower extremity edema, orthopnea, PND, dizziness, near syncope or syncope. She has only had 2 short episodes of palpitations since November 2022.   Primary Care Physician: Cassandria Anger, MD  Past Medical History:  Diagnosis Date   Allergic rhinitis, cause unspecified    Backache, unspecified    Benign fasciculation-cramp syndrome 12/03/2012    Worsening with  stress, fatigue .   Calculus of kidney    Conversion disorder    Cotton wool spots    Esophageal reflux    Floater, vitreous, left  05/2016   GERD (gastroesophageal reflux disease)    Hemorrhage of rectum and anus    Internal hemorrhoids without mention of complication    Irritable bowel syndrome    Microscopic hematuria    Migraine headache with aura    Mild aortic insufficiency    Mild tricuspid regurgitation    Osteopenia    Other plastic surgery for unacceptable cosmetic appearance    Inj tx fllers/expander   Personal history of urinary calculi    Postmenopausal atrophic vaginitis    Premature atrial contractions    PVC (premature ventricular contraction)    a. Event monitor 2013.   Unspecified constipation    Vasovagal syncope     Past Surgical History:  Procedure Laterality Date   BREAST BIOPSY Right    Cosmetic Procedures w/Injection therapy     SKIN CANCER EXCISION Left    thigh   WISDOM TOOTH EXTRACTION      Current Outpatient Medications  Medication Sig Dispense Refill   ALPRAZolam (XANAX) 0.25 MG tablet 0.5-1 po bid prn anxiety 30 tablet 1   Botulinum Toxin Type A, Cosm, 50 units SOLR Inject 4-6 Units into the muscle. 3 month  injection     Cholecalciferol (VITAMIN D3 PO) Take 50 mcg by mouth daily.     Cyanocobalamin (B-12) 1000 MCG SUBL PLACE 1 TABLET (1,000 MCG TOTAL) UNDER THE TONGUE DAILY. 100 tablet 3   DEXILANT 60 MG capsule Take 1 capsule (60 mg total) by mouth daily.  90 capsule 3   EPIPEN 2-PAK 0.3 MG/0.3ML SOAJ injection See admin instructions. Reported on 10/08/2015  1   EUCRISA 2 % OINT Apply topically. Apply 1 to 2 times a day     famotidine (PEPCID) 40 MG tablet TAKE 1 TABLET 1 HOUR BEFORE BED 90 tablet 1   loratadine (CLARITIN) 10 MG tablet TAKE 1 TABLET BY MOUTH EVERY DAY 90 tablet 1   mometasone (NASONEX) 50 MCG/ACT nasal spray 2 sprays in each nostril 3 each 3   Multiple Vitamins-Minerals (ICAPS) TABS Take 1 tablet by mouth 2 (two) times daily.     nitrofurantoin, macrocrystal-monohydrate, (MACROBID) 100 MG capsule Take 1 capsule (100 mg total) by mouth 2 (two) times daily  for 3 days. 6 capsule 0   PREMARIN vaginal cream SMARTSIG:Vaginal Every 12 Hours     timolol (TIMOPTIC) 0.25 % ophthalmic solution SMARTSIG:In Eye(s)     tretinoin (RETIN-A) 0.05 % cream Apply 1 application topically at bedtime.     triamcinolone ointment (KENALOG) 0.1 % Apply topically 2 (two) times daily.     UNABLE TO FIND once a week. Med Name: allergy shots     valACYclovir (VALTREX) 500 MG tablet Take 1 tablet (500 mg total) by mouth 2 (two) times daily. 10 tablet 2   hyoscyamine (LEVSIN) 0.125 MG tablet Take 1 tablet (0.125 mg total) by mouth every 4 (four) hours as needed for up to 10 days (chest spasms). 100 tablet 3   No current facility-administered medications for this visit.    Allergies  Allergen Reactions   Codeine Other (See Comments)    Doesn't remeber   Doxycycline Other (See Comments)   Epinephrine     Heart racing    Neomycin     Topical rash from cream   Penicillins Hives and Other (See Comments)    Has patient had a PCN reaction causing immediate rash, facial/tongue/throat swelling, SOB or lightheadedness with hypotension: Yes Has patient had a PCN reaction causing severe rash involving mucus membranes or skin necrosis: Yes Has patient had a PCN reaction that required hospitalization No Has patient had a PCN reaction occurring within the last 10 years: No If all of the above answers are "NO", then may proceed with Cephalosporin use.    Protonix [Pantoprazole Sodium]     ?HAs   Sulfonamide Derivatives Other (See Comments)    Doesn't remember    Zithromax [Azithromycin] Rash    Social History   Socioeconomic History   Marital status: Married    Spouse name: [name]   Number of children: 0   Years of education: 14   Highest education level: Not on file  Occupational History   Occupation: OFFICE liasion    Employer: [employer]   Occupation: Real Data processing manager   Occupation: OFFICE LEAZON    Employer: [employer]  Tobacco Use    Smoking status: Never   Smokeless tobacco: Never  Vaping Use   Vaping Use: Never used  Substance and Sexual Activity   Alcohol use: No   Drug use: No   Sexual activity: Yes    Birth control/protection: Post-menopausal  Other Topics Concern   Not on file  Social History Narrative   Vocational School in [place]. Married '90 - marriage is in very good shape '12, No children.  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 Offerred medical services for them if needed (Nov '11)  Patient is married [name]) and lives at home with her husband.   Patient is working Administrator, arts work.   Patient has a high school education.   Patient is right-handed.   Patient does not drink any caffeine.      Social Determinants of Health   Financial Resource Strain: Low Risk    Difficulty of Paying Living Expenses: Not hard at all  Food Insecurity: No Food Insecurity   Worried About Charity fundraiser in the Last Year: Never true   [place] in the Last Year: Never true  Transportation Needs: No Transportation Needs   Lack of Transportation (Medical): No   Lack of Transportation (Non-Medical): No  Physical Activity: Sufficiently Active   Days of Exercise per Week: 6 days   Minutes of Exercise per Session: 60 min  Stress: No Stress Concern Present   Feeling of Stress : Not at all  Social Connections: Socially Integrated   Frequency of Communication with Friends and Family: More than three times a week   Frequency of Social Gatherings with Friends and Family: Once a week   Attends Religious Services: More than 4 times per year   Active Member of [employer] or Organizations: No   Attends Music therapist: More than 4 times per year   Marital Status: Married  Human resources officer Violence: Not At Risk   Fear of Current or Ex-Partner: No   Emotionally Abused: No   Physically Abused: No   Sexually Abused: No     Family History  Problem Relation Age of Onset   Hyperlipidemia Mother    Hypertension Mother    Hyperlipidemia Father    COPD Father        Smoker, deceased Jun 13, 2014   Hypertension Father    Hypertension Sister    Hyperlipidemia Sister    Headache Sister    Diabetes Neg Hx    Colon cancer Neg Hx    Breast cancer Neg Hx    Coronary artery disease Neg Hx    Esophageal cancer Neg Hx    Stomach cancer Neg Hx    Pancreatic cancer Neg Hx    Liver disease Neg Hx     Review of Systems:  As stated in the HPI and otherwise negative.   BP 108/62    Pulse 64    Ht 5\' 2"  (1.575 m)    Wt 102 lb (46.3 kg)    SpO2 97%    BMI 18.66 kg/m   Physical Examination:  General: Well developed, well nourished, NAD  HEENT: OP clear, mucus membranes moist  SKIN: warm, dry. No rashes. Neuro: No focal deficits  Musculoskeletal: Muscle strength 5/5 all ext  Psychiatric: Mood and affect normal  Neck: No JVD, no carotid bruits, no thyromegaly, no lymphadenopathy.  Lungs:Clear bilaterally, no wheezes, rhonci, crackles Cardiovascular: Regular rate and rhythm. No murmurs, gallops or rubs. Abdomen:Soft. Bowel sounds present. Non-tender.  Extremities: No lower extremity edema. Pulses are 2 + in the bilateral DP/PT.  Echo March 2020: 1. The left ventricle has normal systolic function, with an ejection fraction of 55-60%. The cavity size was normal. Left ventricular diastolic parameters were normal.  2. The right ventricle has normal systolic function. The cavity was normal. There is no increase in right ventricular wall thickness.  3. The tricuspid valve is grossly normal.  4. The aortic valve is tricuspid Mild thickening of the aortic valve Aortic valve regurgitation is mild by color flow Doppler.  5. Normal LV  function; sclerotic aortic valve with mild AI.  EKG:  EKG is not ordered today. The ekg ordered today demonstrates   Recent Labs: 12/16/2020: ALT 15; BUN 23; Creatinine, Ser 0.87; Hemoglobin  12.9; Platelets 170.0; Potassium 4.6; Sodium 141; TSH 1.61   Lipid Panel    Component Value Date/Time   CHOL 192 12/16/2020 0901   TRIG 52.0 12/16/2020 0901   TRIG 66 06/25/2006 0741   HDL 74.30 12/16/2020 0901   CHOLHDL 3 12/16/2020 0901   VLDL 10.4 12/16/2020 0901   LDLCALC 107 (H) 12/16/2020 0901   LDLCALC 115 (H) 01/11/2020 0800     Wt Readings from Last 3 Encounters:  07/18/21 102 lb (46.3 kg)  06/16/21 102 lb (46.3 kg)  05/02/21 102 lb (46.3 kg)     Other studies Reviewed: Additional studies/ records that were reviewed today include:  Review of the above records demonstrates:    Assessment and Plan:   1. Palpitations/PACs/PVCs/SVT: Followed in EP clinic. Rare palpitations. Continue Cardizem daily and flecainide as needed.    2. Aortic valve insufficiency: Mild by echo March 2022. Will repeat echo March 2024    3. Carotid artery disease: Mild disease by u/s August 2019.   Current medicines are reviewed at length with the patient today.  The patient does not have concerns regarding medicines.  The following changes have been made:  no change  Labs/ tests ordered today include:   No orders of the defined types were placed in this encounter.  Disposition:   F/U with me in 12  months   Signed, Lauree Chandler, MD 07/18/2021 9:52 AM    [facility] [address], [place], [place]  [address] Phone: [phone]; Fax: [phone]

## 2021-07-18 NOTE — Patient Instructions (Signed)
Medication Instructions:  Your physician recommends that you continue on your current medications as directed. Please refer to the Current Medication list given to you today.  *If you need a refill on your cardiac medications before your next appointment, please call your pharmacy*   Lab Work: None ordered   If you have labs (blood work) drawn today and your tests are completely normal, you will receive your results only by: [place] (if you have MyChart) OR A paper copy in the mail If you have any lab test that is abnormal or we need to change your treatment, we will call you to review the results.   Testing/Procedures: None ordered    Follow-Up: At [facility], you and your health needs are our priority.  As part of our continuing mission to provide you with exceptional heart care, we have created designated Provider Care Teams.  These Care Teams include your primary Cardiologist (physician) and Advanced Practice Providers (APPs -  Physician Assistants and Nurse Practitioners) who all work together to provide you with the care you need, when you need it.  We recommend signing up for the patient portal called "MyChart".  Sign up information is provided on this After Visit Summary.  MyChart is used to connect with patients for Virtual Visits (Telemedicine).  Patients are able to view lab/test results, encounter notes, upcoming appointments, etc.  Non-urgent messages can be sent to your provider as well.   To learn more about what you can do with MyChart, go to [email or web address].    Your next appointment:   12 month(s)  The format for your next appointment:   In Person  Provider:   Lauree Chandler, MD     Other Instructions None

## 2021-07-20 DIAGNOSIS — J3089 Other allergic rhinitis: Secondary | ICD-10-CM | POA: Diagnosis not present

## 2021-07-27 DIAGNOSIS — J3089 Other allergic rhinitis: Secondary | ICD-10-CM | POA: Diagnosis not present

## 2021-07-28 DIAGNOSIS — H43811 Vitreous degeneration, right eye: Secondary | ICD-10-CM | POA: Diagnosis not present

## 2021-08-03 DIAGNOSIS — J3089 Other allergic rhinitis: Secondary | ICD-10-CM | POA: Diagnosis not present

## 2021-08-03 DIAGNOSIS — J301 Allergic rhinitis due to pollen: Secondary | ICD-10-CM | POA: Diagnosis not present

## 2021-08-03 DIAGNOSIS — J3081 Allergic rhinitis due to animal (cat) (dog) hair and dander: Secondary | ICD-10-CM | POA: Diagnosis not present

## 2021-08-07 ENCOUNTER — Other Ambulatory Visit: Payer: Self-pay

## 2021-08-07 MED ORDER — FAMOTIDINE 40 MG PO TABS: 40.0000 mg | ORAL_TABLET | Freq: Every day | ORAL | 2 refills | Status: DC

## 2021-08-09 DIAGNOSIS — J3081 Allergic rhinitis due to animal (cat) (dog) hair and dander: Secondary | ICD-10-CM | POA: Diagnosis not present

## 2021-08-09 DIAGNOSIS — J3089 Other allergic rhinitis: Secondary | ICD-10-CM | POA: Diagnosis not present

## 2021-08-09 DIAGNOSIS — J301 Allergic rhinitis due to pollen: Secondary | ICD-10-CM | POA: Diagnosis not present

## 2021-08-15 ENCOUNTER — Ambulatory Visit: Payer: Medicare Other | Admitting: Internal Medicine

## 2021-08-18 DIAGNOSIS — J3089 Other allergic rhinitis: Secondary | ICD-10-CM | POA: Diagnosis not present

## 2021-08-22 NOTE — Progress Notes (Signed)
[facility] [place] Phone: [phone] Subjective:   [name], am serving as a scribe for Dr. Hulan Saas.  This visit occurred during the SARS-CoV-2 public health emergency.  Safety protocols were in place, including screening questions prior to the visit, additional usage of staff PPE, and extensive cleaning of exam room while observing appropriate contact time as indicated for disinfecting solutions.    I'm seeing this patient by the request  of:  Plotnikov, [name], MD  CC: Low back pain  NTI:[id number]  03/16/2021 I believe the patient is continuing to have more gluteal tendinitis with many of the possibility of sacroiliac dysfunction.  Discussed with patient about hip abductor strengthening and given some exercises.  Discussed decreasing the amount of repetitions patient is doing certain activities.  Follow-up with me again in 5 to 6 weeks to make sure patient is improving  Updated 08/23/2021 [name] is a 71 y.o. female coming in with complaint of left SI pain for past 2 weeks. Patient notices when she wakes up in morning she will have hip pain. Pain subsides when she moves around throughout her day. Patient is working out.  Was told by cardiologist that she should maybe not do quite as much weight lifting.  Working out with 2 trainers and is doing some [employer].  Wants to make sure it is safe.  has not noticed any worsening pain with this type of activity.  Seems to be worse at night.       Past Medical History:  Diagnosis Date   Allergic rhinitis, cause unspecified    Backache, unspecified    Benign fasciculation-cramp syndrome 12/03/2012    Worsening with  stress, fatigue .   Calculus of kidney    Conversion disorder    Cotton wool spots    Esophageal reflux    Floater, vitreous, left 05/2016   GERD (gastroesophageal reflux disease)    Hemorrhage of rectum and anus    Internal  hemorrhoids without mention of complication    Irritable bowel syndrome    Microscopic hematuria    Migraine headache with aura    Mild aortic insufficiency    Mild tricuspid regurgitation    Osteopenia    Other plastic surgery for unacceptable cosmetic appearance    Inj tx fllers/expander   Personal history of urinary calculi    Postmenopausal atrophic vaginitis    Premature atrial contractions    PVC (premature ventricular contraction)    a. Event monitor 2013.   Unspecified constipation    Vasovagal syncope    Past Surgical History:  Procedure Laterality Date   BREAST BIOPSY Right    Cosmetic Procedures w/Injection therapy     SKIN CANCER EXCISION Left    thigh   WISDOM TOOTH EXTRACTION     Social History   Socioeconomic History   Marital status: Married    Spouse name: [name]   Number of children: 0   Years of education: 14   Highest education level: Not on file  Occupational History   Occupation: OFFICE liasion    Employer: [employer]   Occupation: Real Data processing manager   Occupation: OFFICE LEAZON    Employer: [employer]  Tobacco Use   Smoking status: Never   Smokeless tobacco: Never  Vaping Use   Vaping Use: Never used  Substance and Sexual Activity   Alcohol use: [name]   Drug use: [name]   Sexual activity: Yes  Birth control/protection: Post-menopausal  Other Topics Concern   Not on file  Social History Narrative   Vocational School in [place]. Married '90 - marriage is in very good shape '12, [name] children.  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 Offerred medical services for them if needed (Nov '11)   Patient is married [name]) and lives at home with her husband.   Patient is working Administrator, arts work.   Patient has a high school education.   Patient is right-handed.   Patient does not drink any caffeine.      Social Determinants of Health    Financial Resource Strain: Low Risk    Difficulty of Paying Living Expenses: Not hard at all  Food Insecurity: [name] Food Insecurity   Worried About Charity fundraiser in the Last Year: Never true   [place] in the Last Year: Never true  Transportation Needs: [name] Transportation Needs   Lack of Transportation (Medical): [name]   Lack of Transportation (Non-Medical): [name]  Physical Activity: Sufficiently Active   Days of Exercise per Week: 6 days   Minutes of Exercise per Session: 60 min  Stress: [name] Stress Concern Present   Feeling of Stress : Not at all  Social Connections: Socially Integrated   Frequency of Communication with Friends and Family: More than three times a week   Frequency of Social Gatherings with Friends and Family: Once a week   Attends Religious Services: More than 4 times per year   Active Member of [employer] or Organizations: [name]   Attends Music therapist: More than 4 times per year   Marital Status: Married   Allergies  Allergen Reactions   Codeine Other (See Comments)    Doesn't remeber   Doxycycline Other (See Comments)   Epinephrine     Heart racing    Neomycin     Topical rash from cream   Penicillins Hives and Other (See Comments)    Has patient had a PCN reaction causing immediate rash, facial/tongue/throat swelling, SOB or lightheadedness with hypotension: Yes Has patient had a PCN reaction causing severe rash involving mucus membranes or skin necrosis: Yes Has patient had a PCN reaction that required hospitalization [name] Has patient had a PCN reaction occurring within the last 10 years: [name] If all of the above answers are "[name]", then may proceed with Cephalosporin use.    Protonix [Pantoprazole Sodium]     ?HAs   Sulfonamide Derivatives Other (See Comments)    Doesn't remember    Zithromax [Azithromycin] Rash   Family History  Problem Relation Age of Onset   Hyperlipidemia Mother    Hypertension Mother    Hyperlipidemia Father    COPD  Father        Smoker, deceased 06/22/2014   Hypertension Father    Hypertension Sister    Hyperlipidemia Sister    Headache Sister    Diabetes Neg Hx    Colon cancer Neg Hx    Breast cancer Neg Hx    Coronary artery disease Neg Hx    Esophageal cancer Neg Hx    Stomach cancer Neg Hx    Pancreatic cancer Neg Hx    Liver disease Neg Hx      Current Outpatient Medications (Cardiovascular):    EPIPEN 2-PAK 0.3 MG/0.3ML SOAJ injection, See admin instructions. Reported on 10/08/2015  Current Outpatient Medications (Respiratory):    loratadine (  CLARITIN) 10 MG tablet, TAKE 1 TABLET BY MOUTH EVERY DAY   mometasone (NASONEX) 50 MCG/ACT nasal spray, 2 sprays in each nostril   Current Outpatient Medications (Hematological):    Cyanocobalamin (B-12) 1000 MCG SUBL, PLACE 1 TABLET (1,000 MCG TOTAL) UNDER THE TONGUE DAILY.  Current Outpatient Medications (Other):    ALPRAZolam (XANAX) 0.25 MG tablet, 0.5-1 po bid prn anxiety   Botulinum Toxin Type A, Cosm, 50 units SOLR, Inject 4-6 Units into the muscle. 3 month  injection   Cholecalciferol (VITAMIN D3 PO), Take 50 mcg by mouth daily.   DEXILANT 60 MG capsule, Take 1 capsule (60 mg total) by mouth daily.   EUCRISA 2 % OINT, Apply topically. Apply 1 to 2 times a day   famotidine (PEPCID) 40 MG tablet, TAKE 1 TABLET 1 HOUR BEFORE BED   famotidine (PEPCID) 40 MG tablet, Take 1 tablet (40 mg total) by mouth at bedtime.   Multiple Vitamins-Minerals (ICAPS) TABS, Take 1 tablet by mouth 2 (two) times daily.   PREMARIN vaginal cream, SMARTSIG:Vaginal Every 12 Hours   timolol (TIMOPTIC) 0.25 % ophthalmic solution, SMARTSIG:In Eye(s)   tretinoin (RETIN-A) 0.05 % cream, Apply 1 application topically at bedtime.   triamcinolone ointment (KENALOG) 0.1 %, Apply topically 2 (two) times daily.   UNABLE TO FIND, once a week. Med Name: allergy shots   valACYclovir (VALTREX) 500 MG tablet, Take 1 tablet (500 mg total) by mouth 2 (two) times daily.   hyoscyamine  (LEVSIN) 0.125 MG tablet, Take 1 tablet (0.125 mg total) by mouth every 4 (four) hours as needed for up to 10 days (chest spasms).   Reviewed prior external information including notes and imaging from  primary care provider As well as notes that were available from care everywhere and other healthcare systems.  Past medical history, social, surgical and family history all reviewed in electronic medical record.  [name] pertanent information unless stated regarding to the chief complaint.   Review of Systems:  [name] headache, visual changes, nausea, vomiting, diarrhea, constipation, dizziness, abdominal pain, skin rash, fevers, chills, night sweats, weight loss, swollen lymph nodes, body aches, joint swelling, chest pain, shortness of breath, mood changes. POSITIVE muscle aches  Objective  Blood pressure 120/80, pulse 76, height '5\' 2"'$  (1.575 m), weight 102 lb (46.3 kg), SpO2 97 %.   General: [name] apparent distress alert and oriented x3 mood and affect normal, dressed appropriately.  HEENT: Pupils equal, extraocular movements intact  Respiratory: Patient's speak in full sentences and does not appear short of breath  Cardiovascular: [name] lower extremity edema, non tender, [name] erythema  Gait normal with good balance and coordination.  MSK: Back exam has [name] significant loss of lordosis.  Patient does have very minimal tenderness over the sacroiliac joint.  He does have very mild tightness noted with FABER test on the left compared to the right.  Negative straight leg test.  5 out of 5 strength in lower extremities.   Impression and Recommendations:    The above documentation has been reviewed and is accurate and complete Lyndal Pulley, DO

## 2021-08-23 ENCOUNTER — Ambulatory Visit (INDEPENDENT_AMBULATORY_CARE_PROVIDER_SITE_OTHER): Payer: Medicare Other

## 2021-08-23 ENCOUNTER — Other Ambulatory Visit: Payer: Self-pay

## 2021-08-23 ENCOUNTER — Encounter: Payer: Self-pay | Admitting: Family Medicine

## 2021-08-23 ENCOUNTER — Ambulatory Visit (INDEPENDENT_AMBULATORY_CARE_PROVIDER_SITE_OTHER): Payer: Medicare Other | Admitting: Family Medicine

## 2021-08-23 VITALS — BP 120/80 | HR 76 | Ht 62.0 in | Wt 102.0 lb

## 2021-08-23 DIAGNOSIS — M25552 Pain in left hip: Secondary | ICD-10-CM | POA: Diagnosis not present

## 2021-08-23 DIAGNOSIS — M545 Low back pain, unspecified: Secondary | ICD-10-CM

## 2021-08-23 DIAGNOSIS — M4316 Spondylolisthesis, lumbar region: Secondary | ICD-10-CM | POA: Diagnosis not present

## 2021-08-23 NOTE — Assessment & Plan Note (Signed)
Patient continues to stay very active overall.  I think that is fantastic for patient.  Would like her to keep increasing activity.  Seems to have some sacroiliac dysfunction that could be contributing.  Patient is working with a Product/process development scientist.  Patient at this point we will continue this.  Due to patient having pain only really at night we discussed changing other type of position.  Has the pain only in the mornings and then seems to resolve on its own.  We discussed if any radiation of pain to seek medical attention sooner but otherwise follow-up again in 6 to 8 weeks patient did bring her weight lifting regimen.  It does appear that she is doing very well with the weight aspect.  Discussed with her that I would like her to stay stabilized with the amount of weight she is doing and can increase her repetitions if necessary.  Total time reviewing patient's previous imaging including x-ray from 2019 of the lumbar spine and the hip as well as discussing with patient greater than 31 minutes ?

## 2021-08-23 NOTE — Patient Instructions (Signed)
Xray today ?Tell trainer we are good on weights but if we need to change anything we can increase reps but slowly ?Try icing for 10 min  ?SI exercises ?Consider sleeping with pillow between knees ?See me in 6-8 weeks ?

## 2021-08-24 DIAGNOSIS — J3089 Other allergic rhinitis: Secondary | ICD-10-CM | POA: Diagnosis not present

## 2021-08-30 DIAGNOSIS — J3089 Other allergic rhinitis: Secondary | ICD-10-CM | POA: Diagnosis not present

## 2021-09-08 DIAGNOSIS — J3089 Other allergic rhinitis: Secondary | ICD-10-CM | POA: Diagnosis not present

## 2021-09-14 DIAGNOSIS — J301 Allergic rhinitis due to pollen: Secondary | ICD-10-CM | POA: Diagnosis not present

## 2021-09-14 DIAGNOSIS — J3089 Other allergic rhinitis: Secondary | ICD-10-CM | POA: Diagnosis not present

## 2021-09-14 DIAGNOSIS — J3081 Allergic rhinitis due to animal (cat) (dog) hair and dander: Secondary | ICD-10-CM | POA: Diagnosis not present

## 2021-09-20 ENCOUNTER — Ambulatory Visit: Payer: Medicare Other | Admitting: Internal Medicine

## 2021-09-21 DIAGNOSIS — J3089 Other allergic rhinitis: Secondary | ICD-10-CM | POA: Diagnosis not present

## 2021-09-25 DIAGNOSIS — N6012 Diffuse cystic mastopathy of left breast: Secondary | ICD-10-CM | POA: Diagnosis not present

## 2021-09-25 DIAGNOSIS — N6011 Diffuse cystic mastopathy of right breast: Secondary | ICD-10-CM | POA: Diagnosis not present

## 2021-09-25 LAB — HM MAMMOGRAPHY

## 2021-09-27 ENCOUNTER — Encounter: Payer: Self-pay | Admitting: Internal Medicine

## 2021-09-28 DIAGNOSIS — J3089 Other allergic rhinitis: Secondary | ICD-10-CM | POA: Diagnosis not present

## 2021-10-02 DIAGNOSIS — J301 Allergic rhinitis due to pollen: Secondary | ICD-10-CM | POA: Diagnosis not present

## 2021-10-02 DIAGNOSIS — R21 Rash and other nonspecific skin eruption: Secondary | ICD-10-CM | POA: Diagnosis not present

## 2021-10-02 DIAGNOSIS — T781XXD Other adverse food reactions, not elsewhere classified, subsequent encounter: Secondary | ICD-10-CM | POA: Diagnosis not present

## 2021-10-02 DIAGNOSIS — J3089 Other allergic rhinitis: Secondary | ICD-10-CM | POA: Diagnosis not present

## 2021-10-04 ENCOUNTER — Ambulatory Visit: Payer: Medicare Other | Admitting: Family Medicine

## 2021-10-04 DIAGNOSIS — J3089 Other allergic rhinitis: Secondary | ICD-10-CM | POA: Diagnosis not present

## 2021-10-04 DIAGNOSIS — J3081 Allergic rhinitis due to animal (cat) (dog) hair and dander: Secondary | ICD-10-CM | POA: Diagnosis not present

## 2021-10-04 DIAGNOSIS — J301 Allergic rhinitis due to pollen: Secondary | ICD-10-CM | POA: Diagnosis not present

## 2021-10-05 DIAGNOSIS — L820 Inflamed seborrheic keratosis: Secondary | ICD-10-CM | POA: Diagnosis not present

## 2021-10-05 DIAGNOSIS — D485 Neoplasm of uncertain behavior of skin: Secondary | ICD-10-CM | POA: Diagnosis not present

## 2021-10-10 ENCOUNTER — Other Ambulatory Visit (HOSPITAL_BASED_OUTPATIENT_CLINIC_OR_DEPARTMENT_OTHER): Payer: Self-pay

## 2021-10-11 ENCOUNTER — Ambulatory Visit: Payer: Medicare Other | Admitting: Internal Medicine

## 2021-10-12 ENCOUNTER — Ambulatory Visit (INDEPENDENT_AMBULATORY_CARE_PROVIDER_SITE_OTHER): Payer: Medicare Other | Admitting: Internal Medicine

## 2021-10-12 ENCOUNTER — Encounter: Payer: Self-pay | Admitting: Internal Medicine

## 2021-10-12 VITALS — BP 112/80 | HR 60 | Temp 98.3°F | Ht 62.0 in | Wt 101.4 lb

## 2021-10-12 DIAGNOSIS — K219 Gastro-esophageal reflux disease without esophagitis: Secondary | ICD-10-CM

## 2021-10-12 DIAGNOSIS — G43109 Migraine with aura, not intractable, without status migrainosus: Secondary | ICD-10-CM | POA: Diagnosis not present

## 2021-10-12 DIAGNOSIS — F411 Generalized anxiety disorder: Secondary | ICD-10-CM | POA: Diagnosis not present

## 2021-10-12 DIAGNOSIS — N952 Postmenopausal atrophic vaginitis: Secondary | ICD-10-CM | POA: Diagnosis not present

## 2021-10-12 DIAGNOSIS — E538 Deficiency of other specified B group vitamins: Secondary | ICD-10-CM | POA: Diagnosis not present

## 2021-10-12 DIAGNOSIS — Z0000 Encounter for general adult medical examination without abnormal findings: Secondary | ICD-10-CM

## 2021-10-12 MED ORDER — DEXILANT 60 MG PO CPDR: 60.0000 mg | DELAYED_RELEASE_CAPSULE | Freq: Every day | ORAL | 3 refills | Status: DC

## 2021-10-12 MED ORDER — PREMARIN 0.625 MG/GM VA CREA: TOPICAL_CREAM | VAGINAL | 3 refills | Status: DC

## 2021-10-12 NOTE — Assessment & Plan Note (Signed)
Cont w/Premarin PV 3/wk ?

## 2021-10-12 NOTE — Assessment & Plan Note (Addendum)
Rare relapses - Ibuprofen prn ?

## 2021-10-12 NOTE — Assessment & Plan Note (Addendum)
Cont on Xanax prn: not using at all lately ? Potential benefits of a long term opioids use as well as potential risks (i.e. addiction risk, apnea etc) and complications (i.e. Somnolence, constipation and others) were explained to the patient and were aknowledged. ?

## 2021-10-12 NOTE — Assessment & Plan Note (Signed)
On Dexilant. Famotidine prn ?EGD pending w/colonoscopy ?

## 2021-10-12 NOTE — Progress Notes (Signed)
? ?Subjective:  ?Patient ID: [name], female    DOB: 1950-11-15  Age: 71 y.o. MRN: [id number] ? ?CC: Follow-up ? ? ?HPI ?Naidelin Gugliotta presents for anxiety, vaginitis, GERD ? ?Outpatient Medications Prior to Visit  ?Medication Sig Dispense Refill  ? ALPRAZolam (XANAX) 0.25 MG tablet 0.5-1 po bid prn anxiety 30 tablet 1  ? Botulinum Toxin Type A, Cosm, 50 units SOLR Inject 4-6 Units into the muscle. 3 month  injection    ? Cholecalciferol (VITAMIN D3 PO) Take 50 mcg by mouth daily.    ? Cyanocobalamin (B-12) 1000 MCG SUBL PLACE 1 TABLET (1,000 MCG TOTAL) UNDER THE TONGUE DAILY. 100 tablet 3  ? EPIPEN 2-PAK 0.3 MG/0.3ML SOAJ injection See admin instructions. Reported on 10/08/2015  1  ? EUCRISA 2 % OINT Apply topically. Apply 1 to 2 times a day    ? famotidine (PEPCID) 40 MG tablet Take 1 tablet (40 mg total) by mouth at bedtime. 30 tablet 2  ? loratadine (CLARITIN) 10 MG tablet TAKE 1 TABLET BY MOUTH EVERY DAY 90 tablet 1  ? mometasone (NASONEX) 50 MCG/ACT nasal spray 2 sprays in each nostril 3 each 3  ? Multiple Vitamins-Minerals (ICAPS) TABS Take 1 tablet by mouth 2 (two) times daily.    ? timolol (TIMOPTIC) 0.25 % ophthalmic solution SMARTSIG:In Eye(s)    ? tretinoin (RETIN-A) 0.05 % cream Apply 1 application topically at bedtime.    ? triamcinolone ointment (KENALOG) 0.1 % Apply topically 2 (two) times daily.    ? UNABLE TO FIND once a week. Med Name: allergy shots    ? valACYclovir (VALTREX) 500 MG tablet Take 1 tablet (500 mg total) by mouth 2 (two) times daily. 10 tablet 2  ? DEXILANT 60 MG capsule Take 1 capsule (60 mg total) by mouth daily. 90 capsule 3  ? famotidine (PEPCID) 40 MG tablet TAKE 1 TABLET 1 HOUR BEFORE BED 90 tablet 1  ? PREMARIN vaginal cream SMARTSIG:Vaginal Every 12 Hours    ? hyoscyamine (LEVSIN) 0.125 MG tablet Take 1 tablet (0.125 mg total) by mouth every 4 (four) hours as needed for up to 10 days (chest spasms). 100 tablet 3  ? ?No facility-administered medications prior to  visit.  ? ? ?ROS: ?Review of Systems  ?Constitutional:  Negative for activity change, appetite change, chills, fatigue and unexpected weight change.  ?HENT:  Negative for congestion, mouth sores and sinus pressure.   ?Eyes:  Negative for visual disturbance.  ?Respiratory:  Negative for cough and chest tightness.   ?Gastrointestinal:  Negative for abdominal pain and nausea.  ?Genitourinary:  Negative for difficulty urinating, frequency and vaginal pain.  ?Musculoskeletal:  Negative for back pain and gait problem.  ?Skin:  Negative for pallor and rash.  ?Neurological:  Negative for dizziness, tremors, weakness, numbness and headaches.  ?Psychiatric/Behavioral:  Negative for confusion and sleep disturbance.   ? ?Objective:  ?BP 112/80 (BP Location: Left Arm, Patient Position: Sitting, Cuff Size: Normal)   Pulse 60   Temp 98.3 ?F (36.8 ?C) (Oral)   Ht '5\' 2"'$  (1.575 m)   Wt 101 lb 6.4 oz (46 kg)   SpO2 98%   BMI 18.55 kg/m?  ? ?BP Readings from Last 3 Encounters:  ?10/12/21 112/80  ?08/23/21 120/80  ?07/18/21 108/62  ? ? ?Wt Readings from Last 3 Encounters:  ?10/12/21 101 lb 6.4 oz (46 kg)  ?08/23/21 102 lb (46.3 kg)  ?07/18/21 102 lb (46.3 kg)  ? ? ?Physical Exam ?Constitutional:   ?  General: She is not in acute distress. ?   Appearance: She is well-developed.  ?HENT:  ?   Head: Normocephalic.  ?   Right Ear: External ear normal.  ?   Left Ear: External ear normal.  ?   Nose: Nose normal.  ?Eyes:  ?   General:     ?   Right eye: No discharge.     ?   Left eye: No discharge.  ?   Conjunctiva/sclera: Conjunctivae normal.  ?   Pupils: Pupils are equal, round, and reactive to light.  ?Neck:  ?   Thyroid: No thyromegaly.  ?   Vascular: No JVD.  ?   Trachea: No tracheal deviation.  ?Cardiovascular:  ?   Rate and Rhythm: Normal rate and regular rhythm.  ?   Heart sounds: Normal heart sounds.  ?Pulmonary:  ?   Effort: No respiratory distress.  ?   Breath sounds: No stridor. No wheezing.  ?Abdominal:  ?   General: Bowel  sounds are normal. There is no distension.  ?   Palpations: Abdomen is soft. There is no mass.  ?   Tenderness: There is no abdominal tenderness. There is no guarding or rebound.  ?Musculoskeletal:     ?   General: No tenderness.  ?   Cervical back: Normal range of motion and neck supple. No rigidity.  ?Lymphadenopathy:  ?   Cervical: No cervical adenopathy.  ?Skin: ?   Findings: No erythema or rash.  ?Neurological:  ?   Cranial Nerves: No cranial nerve deficit.  ?   Motor: No abnormal muscle tone.  ?   Coordination: Coordination normal.  ?   Deep Tendon Reflexes: Reflexes normal.  ?Psychiatric:     ?   Behavior: Behavior normal.     ?   Thought Content: Thought content normal.     ?   Judgment: Judgment normal.  ? ? ?Lab Results  ?Component Value Date  ? WBC 4.5 12/16/2020  ? HGB 12.9 12/16/2020  ? HCT 38.1 12/16/2020  ? PLT 170.0 12/16/2020  ? GLUCOSE 81 12/16/2020  ? CHOL 192 12/16/2020  ? TRIG 52.0 12/16/2020  ? HDL 74.30 12/16/2020  ? LDLCALC 107 (H) 12/16/2020  ? ALT 15 12/16/2020  ? AST 18 12/16/2020  ? NA 141 12/16/2020  ? K 4.6 12/16/2020  ? CL 104 12/16/2020  ? CREATININE 0.87 12/16/2020  ? BUN 23 12/16/2020  ? CO2 30 12/16/2020  ? TSH 1.61 12/16/2020  ? ? ?No results found. ? ?Assessment & Plan:  ? ?Problem List Items Addressed This Visit   ? ? Well adult exam - Primary  ? Relevant Orders  ? TSH  ? Urinalysis  ? CBC with Differential/Platelet  ? Lipid panel  ? Comprehensive metabolic panel  ? Vaginal atrophy  ?  Cont w/Premarin PV 3/wk ?  ?  ? Migraine with aura  ?  Rare relapses - Ibuprofen prn ?  ?  ? Relevant Orders  ? CBC with Differential/Platelet  ? GERD (gastroesophageal reflux disease)  ?  On Dexilant. Famotidine prn ?EGD pending w/colonoscopy ?  ?  ? Relevant Medications  ? DEXILANT 60 MG capsule  ? Generalized anxiety disorder  ?  Cont on Xanax prn: not using at all lately ? Potential benefits of a long term opioids use as well as potential risks (i.e. addiction risk, apnea etc) and  complications (i.e. Somnolence, constipation and others) were explained to the patient and were aknowledged. ? ?  ?  ?  Relevant Orders  ? CBC with Differential/Platelet  ? B12 deficiency  ?  On B12 ?  ?  ? Relevant Orders  ? CBC with Differential/Platelet  ?  ? ? ?Meds ordered this encounter  ?Medications  ? PREMARIN vaginal cream  ?  Sig: Use PV as directed  ?  Dispense:  42.5 g  ?  Refill:  3  ?  Brand only  ? DEXILANT 60 MG capsule  ?  Sig: Take 1 capsule (60 mg total) by mouth daily.  ?  Dispense:  90 capsule  ?  Refill:  3  ?  ? ? ?Follow-up: Return in about 3 months (around 01/11/2022) for a follow-up visit. ? ?[name], MD ?

## 2021-10-12 NOTE — Assessment & Plan Note (Signed)
On B12 

## 2021-10-13 ENCOUNTER — Telehealth: Payer: Self-pay | Admitting: Family Medicine

## 2021-10-13 DIAGNOSIS — J3081 Allergic rhinitis due to animal (cat) (dog) hair and dander: Secondary | ICD-10-CM | POA: Diagnosis not present

## 2021-10-13 DIAGNOSIS — J3089 Other allergic rhinitis: Secondary | ICD-10-CM | POA: Diagnosis not present

## 2021-10-13 DIAGNOSIS — J301 Allergic rhinitis due to pollen: Secondary | ICD-10-CM | POA: Diagnosis not present

## 2021-10-13 NOTE — Telephone Encounter (Signed)
Spoke to patient and gave Dr Thompson Caul recommendations. ? ?

## 2021-10-13 NOTE — Telephone Encounter (Signed)
Try laying on 2 tennis balls in a tube sock at the base of skull for 5 minutes, Alternate 10 minutes of heat then 10 minutes of ice in the area and see how it does,  ?Worsening headache or visual change would need to go to urgent care or ER this weekend  ?

## 2021-10-13 NOTE — Telephone Encounter (Signed)
Patient called stating that she has what she thinks is occipital neuralgia (per her PCP).  ?She has neck pain that goes to the base of her scull and behind her right ear.  ?She has seen Dr Tamala Julian in the past for this and was given exercises. Generally when it flares up she is able to get it to feel better by doing the exercises and taking motrin.  ?This time, it does not seem to be getting better as quickly as it has in the past. She asked if there was anything topical that she could use on her neck to help the pain? ? ?Please advise. ? ?

## 2021-10-18 ENCOUNTER — Ambulatory Visit (INDEPENDENT_AMBULATORY_CARE_PROVIDER_SITE_OTHER): Payer: Medicare Other | Admitting: Family Medicine

## 2021-10-18 VITALS — BP 116/70 | HR 76 | Ht 62.0 in | Wt 103.0 lb

## 2021-10-18 DIAGNOSIS — M542 Cervicalgia: Secondary | ICD-10-CM

## 2021-10-18 DIAGNOSIS — J3081 Allergic rhinitis due to animal (cat) (dog) hair and dander: Secondary | ICD-10-CM | POA: Diagnosis not present

## 2021-10-18 DIAGNOSIS — J3089 Other allergic rhinitis: Secondary | ICD-10-CM | POA: Diagnosis not present

## 2021-10-18 DIAGNOSIS — J301 Allergic rhinitis due to pollen: Secondary | ICD-10-CM | POA: Diagnosis not present

## 2021-10-18 NOTE — Progress Notes (Signed)
? ?I, Peterson Lombard, LAT, ATC acting as a scribe for Lynne Leader, MD. ? ?[name] is a 71 y.o. female who presents to [place] at [facility] today for neck pain. Pt was previously seen by Dr. Tamala Julian for LBP and neck pain and called the office on 10/13/21 c/o what seemed to be occipital neuralgia and was advised to try laying on 2 tennis balls in a tube sock at the base of skull for 5 minutes, alternate 10 minutes of heat then, 10 minutes of ice in the area and see how it does. Worsening headache or visual change would need to go to UC or ED this weekend. Today, pt reports started being bothersome last Wednesday. Was doing overhead chest press and thought she strained it then or when she was getting Botox. Having same pain and symptoms from when she was diagnosed with occipital neuralgia. No shooting severe pain, but has been mild to moderate. Wants to know if some of the new exercises she was doing if she should continue. Heat does help sometimes. ? ?Dx imaging: 08/10/19 C-spine CT ? 03/13/18 C-spine XR ? 06/17/15 C-spine XR ? ?Pertinent review of systems: No fevers or chills ? ?Relevant historical information: PVC.  Migraines.  History of occipital neuralgia. ? ? ?Exam:  ?BP 116/70   Pulse 76   Ht '5\' 2"'$  (1.575 m)   Wt 103 lb (46.7 kg)   SpO2 98%   BMI 18.84 kg/m?  ?General: Well Developed, well nourished, and in no acute distress.  ? ?MSK: C-spine: Nontender midline.  Tender palpation paraspinal musculature right side. ?Decreased cervical motion. ?Upper summary strength is intact. ?Reflexes are intact. ?Negative Spurling's test. ? ? ? ?Lab and Radiology Results ?EXAM: ?CT HEAD WITHOUT CONTRAST ?  ?CT CERVICAL SPINE WITHOUT CONTRAST ?  ?TECHNIQUE: ?Multidetector CT imaging of the head and cervical spine was ?performed following the standard protocol without intravenous ?contrast. Multiplanar CT image reconstructions of the cervical spine ?were also generated. ?  ?COMPARISON:   None. ?  ?FINDINGS: ?CT HEAD FINDINGS ?  ?Brain: No evidence of acute infarction, hemorrhage, hydrocephalus, ?extra-axial collection or mass lesion/mass effect. ?  ?Vascular: Negative for hyperdense vessel ?  ?Skull: Negative ?  ?Sinuses/Orbits: Negative ?  ?Other: None ?  ?CT CERVICAL SPINE FINDINGS ?  ?Alignment: 3 mm anterolisthesis C3-4. Mild anterolisthesis C2-3 and ?C7-T1 ?  ?Skull base and vertebrae: Negative for fracture or mass. ?  ?Soft tissues and spinal canal: Negative for mass or soft tissue ?swelling. ?  ?Disc levels: Multilevel disc and facet degeneration in the cervical ?spine. Mild left foraminal narrowing C3-4 due to spurring. Marked ?left foraminal narrowing C4-5 due to prominent uncinate spurring and ?facet hypertrophy. Associated spinal stenosis. Moderate spinal ?stenosis and moderate left foraminal stenosis at C5-6. Mild ?foraminal narrowing bilaterally C6-7. Bilateral facet degeneration ?at C7-T1. ?  ?Upper chest: Apical scarring bilaterally ?  ?Other: None ?  ?IMPRESSION: ?Negative CT head ?  ?Cervical spondylosis with spinal and foraminal stenosis as above. No ?acute cervical spine abnormality. ?  ?  ?Electronically Signed ?  By: Franchot Gallo M.D. ?  On: 08/10/2019 09:06 ?  ?I, Lynne Leader, personally (independently) visualized and performed the interpretation of the images attached in this note. ? ? ? ? ? ?Assessment and Plan: ?71 y.o. female with right neck pain thought to be due to cervical paraspinal muscle spasm and dysfunction and even some occipital neuralgia.  She is a good candidate for physical therapy referral.  Plan  to refer to PT.  Recommend heat and TENS unit.  We talked about muscle relaxers.  She would like to hold off on this for now which I think is reasonable.  We talked about medication safety with NSAIDs and Tylenol.  Recheck back as needed. ? ? ?PDMP not reviewed this encounter. ?Orders Placed This Encounter  ?Procedures  ? Ambulatory referral to Physical Therapy  ?   Referral Priority:   Routine  ?  Referral Type:   Physical Medicine  ?  Referral Reason:   Specialty Services Required  ?  Requested Specialty:   Physical Therapy  ?  Number of Visits Requested:   1  ? ?No orders of the defined types were placed in this encounter. ? ? ? ?Discussed warning signs or symptoms. Please see discharge instructions. Patient expresses understanding. ? ? ?The above documentation has been reviewed and is accurate and complete Lynne Leader, M.D. ? ? ?

## 2021-10-18 NOTE — Patient Instructions (Addendum)
Thank you for coming in today.  ? ?I've referred you to Physical Therapy.  Let us know if you don't hear from them in one week.  ? ?Recheck back as needed ? ?Try using a Heat Pad ? ?TENS UNIT: ?This is helpful for muscle pain and spasm.  ? ?Search and Purchase a TENS 7000 2nd edition at  ?www.tenspros.com or www.[employer].com ?It should be less than $30.  ? ? ? ?TENS unit instructions: Do not shower or bathe with the unit on ?Turn the unit off before removing electrodes or batteries ?If the electrodes lose stickiness add a drop of water to the electrodes after they are disconnected from the unit and place on plastic sheet. If you continued to have difficulty, call the TENS unit company to purchase more electrodes. ?Do not apply lotion on the skin area prior to use. Make sure the skin is clean and dry as this will help prolong the life of the electrodes. ?After use, always check skin for unusual red areas, rash or other skin difficulties. If there are any skin problems, does not apply electrodes to the same area. ?Never remove the electrodes from the unit by pulling the wires. ?Do not use the TENS unit or electrodes other than as directed. ?Do not change electrode placement without consultating your therapist or physician. ?Keep 2 fingers with between each electrode. ?Wear time ratio is 2:1, on to off times.   ? ?For example on for 30 minutes off for 15 minutes and then on for 30 minutes off for 15 minutes ? ?  ? ? ?

## 2021-10-19 DIAGNOSIS — H2513 Age-related nuclear cataract, bilateral: Secondary | ICD-10-CM | POA: Diagnosis not present

## 2021-10-23 DIAGNOSIS — H6123 Impacted cerumen, bilateral: Secondary | ICD-10-CM | POA: Diagnosis not present

## 2021-10-24 ENCOUNTER — Ambulatory Visit: Payer: Medicare Other | Admitting: Physical Therapy

## 2021-10-30 ENCOUNTER — Ambulatory Visit (INDEPENDENT_AMBULATORY_CARE_PROVIDER_SITE_OTHER): Payer: Medicare Other | Admitting: Family Medicine

## 2021-10-30 ENCOUNTER — Ambulatory Visit (INDEPENDENT_AMBULATORY_CARE_PROVIDER_SITE_OTHER): Payer: Medicare Other

## 2021-10-30 VITALS — BP 118/76 | HR 75 | Ht 62.0 in | Wt 101.6 lb

## 2021-10-30 DIAGNOSIS — M79671 Pain in right foot: Secondary | ICD-10-CM

## 2021-10-30 DIAGNOSIS — M79672 Pain in left foot: Secondary | ICD-10-CM | POA: Diagnosis not present

## 2021-10-30 NOTE — Patient Instructions (Signed)
Thank you for coming in today.  ? ?Please go to [facility] supply to get the post op  we talked about today. You may also be able to get it from [employer].   ? ?Use the post op shoe if needed.  ? ?Please use Voltaren gel (Generic Diclofenac Gel) up to 4x daily for pain as needed.  This is available over-the-counter as both the name brand Voltaren gel and the generic diclofenac gel.  ? ?Recheck with me as needed.  ?

## 2021-10-30 NOTE — Progress Notes (Signed)
? ?  I, Peterson Lombard, LAT, ATC acting as a scribe for Lynne Leader, MD. ? ?[name] is a 71 y.o. female who presents to [place] at [facility] today for R foot pain. Pt was previously seen by Dr. Georgina Snell on 10/18/21 for neck pain. Today, pt c/o R foot pain ongoing since this morning, that she struck on a scale. Pt locates pain across the R MT (mostly, 2-5) ? ?R foot swelling: Dorsal ?Treatments tried: none ? ? ?Pertinent review of systems: No fevers or chills ? ?Relevant historical information: Headaches ? ? ?Exam:  ?BP 118/76   Pulse 75   Ht '5\' 2"'$  (1.575 m)   Wt 101 lb 9.6 oz (46.1 kg)   SpO2 98%   BMI 18.58 kg/m?  ?General: Well Developed, well nourished, and in no acute distress.  ? ?MSK: Right foot: Bunion present.  Swelling present dorsal forefoot overlying metatarsal shafts.  Otherwise normal. ?Normal foot and ankle motion. ?Mildly tender palpation across dorsal forefoot. ?Normal foot and ankle strength. ?Pulses cap refill and sensation are intact distally. ? ? ? ?Lab and Radiology Results ? ?X-ray images right foot obtained today personally and independently interpreted ?No acute fractures are visible. ?Mild MTP DJD ?Await formal radiology review ? ? ? ? ?Assessment and Plan: ?71 y.o. female with contusion right dorsal foot.  Recommend comfortable shoes.  If that is not successful recommend postop shoe.  Recheck back as needed.  Advance activity as tolerated. ? ? ?PDMP not reviewed this encounter. ?Orders Placed This Encounter  ?Procedures  ? DG Foot Complete Right  ?  Standing Status:   Future  ?  Number of Occurrences:   1  ?  Standing Expiration Date:   10/31/2022  ?  Order Specific Question:   Reason for Exam (SYMPTOM  OR DIAGNOSIS REQUIRED)  ?  Answer:   right foot pain  ?  Order Specific Question:   Preferred imaging location?  ?  Answer:   [facility]  ? ?No orders of the defined types were placed in this encounter. ? ? ? ?Discussed warning signs or symptoms.  Please see discharge instructions. Patient expresses understanding. ? ? ?The above documentation has been reviewed and is accurate and complete Lynne Leader, M.D. ? ? ?

## 2021-11-01 NOTE — Progress Notes (Signed)
Right foot x-ray shows no fractures.  A bunion is present.

## 2021-11-02 DIAGNOSIS — J301 Allergic rhinitis due to pollen: Secondary | ICD-10-CM | POA: Diagnosis not present

## 2021-11-02 DIAGNOSIS — J3089 Other allergic rhinitis: Secondary | ICD-10-CM | POA: Diagnosis not present

## 2021-11-02 DIAGNOSIS — J3081 Allergic rhinitis due to animal (cat) (dog) hair and dander: Secondary | ICD-10-CM | POA: Diagnosis not present

## 2021-11-06 DIAGNOSIS — J3089 Other allergic rhinitis: Secondary | ICD-10-CM | POA: Diagnosis not present

## 2021-11-06 DIAGNOSIS — J3081 Allergic rhinitis due to animal (cat) (dog) hair and dander: Secondary | ICD-10-CM | POA: Diagnosis not present

## 2021-11-06 DIAGNOSIS — J301 Allergic rhinitis due to pollen: Secondary | ICD-10-CM | POA: Diagnosis not present

## 2021-11-14 DIAGNOSIS — J301 Allergic rhinitis due to pollen: Secondary | ICD-10-CM | POA: Diagnosis not present

## 2021-11-14 DIAGNOSIS — J3089 Other allergic rhinitis: Secondary | ICD-10-CM | POA: Diagnosis not present

## 2021-11-14 DIAGNOSIS — J3081 Allergic rhinitis due to animal (cat) (dog) hair and dander: Secondary | ICD-10-CM | POA: Diagnosis not present

## 2021-11-15 ENCOUNTER — Ambulatory Visit: Payer: Medicare Other | Admitting: Physical Therapy

## 2021-11-17 ENCOUNTER — Telehealth: Payer: Self-pay | Admitting: *Deleted

## 2021-11-17 NOTE — Telephone Encounter (Signed)
Patient bumped and  injured her left 2nd toe. Is now really sore, nail possibly lifting, tender, a little redness.   She has been scheduled for upcoming appointment 11/23/21.

## 2021-11-21 DIAGNOSIS — J3089 Other allergic rhinitis: Secondary | ICD-10-CM | POA: Diagnosis not present

## 2021-11-21 DIAGNOSIS — J301 Allergic rhinitis due to pollen: Secondary | ICD-10-CM | POA: Diagnosis not present

## 2021-11-21 DIAGNOSIS — J3081 Allergic rhinitis due to animal (cat) (dog) hair and dander: Secondary | ICD-10-CM | POA: Diagnosis not present

## 2021-11-23 ENCOUNTER — Encounter: Payer: Self-pay | Admitting: Podiatry

## 2021-11-23 ENCOUNTER — Ambulatory Visit (INDEPENDENT_AMBULATORY_CARE_PROVIDER_SITE_OTHER): Payer: Medicare Other | Admitting: Podiatry

## 2021-11-23 DIAGNOSIS — M2041 Other hammer toe(s) (acquired), right foot: Secondary | ICD-10-CM | POA: Diagnosis not present

## 2021-11-23 DIAGNOSIS — B351 Tinea unguium: Secondary | ICD-10-CM | POA: Diagnosis not present

## 2021-11-23 DIAGNOSIS — Q828 Other specified congenital malformations of skin: Secondary | ICD-10-CM | POA: Diagnosis not present

## 2021-11-23 DIAGNOSIS — M2042 Other hammer toe(s) (acquired), left foot: Secondary | ICD-10-CM | POA: Diagnosis not present

## 2021-11-23 NOTE — Progress Notes (Signed)
Subjective:   Patient ID: [name], female   DOB: 71 y.o.   MRN: [id number]   HPI Patient presents concerned about some whiteness on the right third nail structural bunion deformity of both feet and lesions underneath both feet which becomes painful.  States she tries to trim them herself the bunions are not hurting but she is concerned about family history and how bad her mom's are and the nail disease   ROS      Objective:  Physical Exam  Neurovascular status found to be intact muscle strength adequate [name] of motion adequate good digital perfusion.  Patient is noted to have structural bunion with hyperostosis medial aspect first metatarsal head of both feet whiteness of the right third nail and the distal two thirds of the nailbed and lesions underneath both feet that are lucent and numerous with small keratotic light areas     Assessment:  Trauma versus fungal infection right third nail that is stationary along with continued structural bunion deformity bilateral and porokeratotic lesions     Plan:  Debridement of lesions accomplished today no iatrogenic bleeding went ahead today discussed bunions do not recommend current surgery and she can use polish on the nail.  We could consider bunions in the future but I want to continue to hold off as long as they are not sore and she is able to wear most of her shoes.  I educated all these different conditions and try to give her the best I could advise on treating the

## 2021-11-28 DIAGNOSIS — L820 Inflamed seborrheic keratosis: Secondary | ICD-10-CM | POA: Diagnosis not present

## 2021-11-28 DIAGNOSIS — L821 Other seborrheic keratosis: Secondary | ICD-10-CM | POA: Diagnosis not present

## 2021-11-28 DIAGNOSIS — D2230 Melanocytic nevi of unspecified part of face: Secondary | ICD-10-CM | POA: Diagnosis not present

## 2021-11-28 DIAGNOSIS — D1800 Hemangioma unspecified site: Secondary | ICD-10-CM | POA: Diagnosis not present

## 2021-11-28 DIAGNOSIS — Z808 Family history of malignant neoplasm of other organs or systems: Secondary | ICD-10-CM | POA: Diagnosis not present

## 2021-11-28 DIAGNOSIS — L578 Other skin changes due to chronic exposure to nonionizing radiation: Secondary | ICD-10-CM | POA: Diagnosis not present

## 2021-11-28 DIAGNOSIS — D225 Melanocytic nevi of trunk: Secondary | ICD-10-CM | POA: Diagnosis not present

## 2021-11-28 DIAGNOSIS — Z86018 Personal history of other benign neoplasm: Secondary | ICD-10-CM | POA: Diagnosis not present

## 2021-11-29 DIAGNOSIS — J3081 Allergic rhinitis due to animal (cat) (dog) hair and dander: Secondary | ICD-10-CM | POA: Diagnosis not present

## 2021-11-29 DIAGNOSIS — J301 Allergic rhinitis due to pollen: Secondary | ICD-10-CM | POA: Diagnosis not present

## 2021-11-29 DIAGNOSIS — J3089 Other allergic rhinitis: Secondary | ICD-10-CM | POA: Diagnosis not present

## 2021-12-05 DIAGNOSIS — J3089 Other allergic rhinitis: Secondary | ICD-10-CM | POA: Diagnosis not present

## 2021-12-05 DIAGNOSIS — J3081 Allergic rhinitis due to animal (cat) (dog) hair and dander: Secondary | ICD-10-CM | POA: Diagnosis not present

## 2021-12-05 DIAGNOSIS — J301 Allergic rhinitis due to pollen: Secondary | ICD-10-CM | POA: Diagnosis not present

## 2021-12-08 ENCOUNTER — Encounter: Payer: Self-pay | Admitting: Cardiovascular Disease

## 2021-12-10 ENCOUNTER — Ambulatory Visit: Payer: Medicare Other

## 2021-12-11 ENCOUNTER — Ambulatory Visit: Payer: Medicare Other | Admitting: Family Medicine

## 2021-12-11 DIAGNOSIS — H04123 Dry eye syndrome of bilateral lacrimal glands: Secondary | ICD-10-CM | POA: Diagnosis not present

## 2021-12-11 DIAGNOSIS — H43393 Other vitreous opacities, bilateral: Secondary | ICD-10-CM | POA: Diagnosis not present

## 2021-12-11 DIAGNOSIS — J3081 Allergic rhinitis due to animal (cat) (dog) hair and dander: Secondary | ICD-10-CM | POA: Diagnosis not present

## 2021-12-11 DIAGNOSIS — J3089 Other allergic rhinitis: Secondary | ICD-10-CM | POA: Diagnosis not present

## 2021-12-11 DIAGNOSIS — H1045 Other chronic allergic conjunctivitis: Secondary | ICD-10-CM | POA: Diagnosis not present

## 2021-12-11 DIAGNOSIS — H16143 Punctate keratitis, bilateral: Secondary | ICD-10-CM | POA: Diagnosis not present

## 2021-12-11 DIAGNOSIS — H11122 Conjunctival concretions, left eye: Secondary | ICD-10-CM | POA: Diagnosis not present

## 2021-12-11 DIAGNOSIS — H2513 Age-related nuclear cataract, bilateral: Secondary | ICD-10-CM | POA: Diagnosis not present

## 2021-12-11 DIAGNOSIS — H11442 Conjunctival cysts, left eye: Secondary | ICD-10-CM | POA: Diagnosis not present

## 2021-12-11 DIAGNOSIS — H35361 Drusen (degenerative) of macula, right eye: Secondary | ICD-10-CM | POA: Diagnosis not present

## 2021-12-11 DIAGNOSIS — H43811 Vitreous degeneration, right eye: Secondary | ICD-10-CM | POA: Diagnosis not present

## 2021-12-11 DIAGNOSIS — J301 Allergic rhinitis due to pollen: Secondary | ICD-10-CM | POA: Diagnosis not present

## 2021-12-11 DIAGNOSIS — G43B0 Ophthalmoplegic migraine, not intractable: Secondary | ICD-10-CM | POA: Diagnosis not present

## 2021-12-11 DIAGNOSIS — H43812 Vitreous degeneration, left eye: Secondary | ICD-10-CM | POA: Diagnosis not present

## 2021-12-15 ENCOUNTER — Other Ambulatory Visit: Payer: Self-pay | Admitting: Physician Assistant

## 2021-12-18 ENCOUNTER — Ambulatory Visit: Payer: Medicare Other | Admitting: Internal Medicine

## 2021-12-20 ENCOUNTER — Other Ambulatory Visit: Payer: Self-pay | Admitting: Physician Assistant

## 2021-12-20 ENCOUNTER — Encounter: Payer: Self-pay | Admitting: Internal Medicine

## 2021-12-20 ENCOUNTER — Ambulatory Visit: Payer: Medicare Other | Admitting: Internal Medicine

## 2021-12-20 ENCOUNTER — Ambulatory Visit (INDEPENDENT_AMBULATORY_CARE_PROVIDER_SITE_OTHER): Payer: Medicare Other | Admitting: Internal Medicine

## 2021-12-20 DIAGNOSIS — J3081 Allergic rhinitis due to animal (cat) (dog) hair and dander: Secondary | ICD-10-CM | POA: Diagnosis not present

## 2021-12-20 DIAGNOSIS — K219 Gastro-esophageal reflux disease without esophagitis: Secondary | ICD-10-CM

## 2021-12-20 DIAGNOSIS — J301 Allergic rhinitis due to pollen: Secondary | ICD-10-CM | POA: Diagnosis not present

## 2021-12-20 DIAGNOSIS — R21 Rash and other nonspecific skin eruption: Secondary | ICD-10-CM | POA: Diagnosis not present

## 2021-12-20 DIAGNOSIS — K0889 Other specified disorders of teeth and supporting structures: Secondary | ICD-10-CM | POA: Insufficient documentation

## 2021-12-20 DIAGNOSIS — E538 Deficiency of other specified B group vitamins: Secondary | ICD-10-CM | POA: Diagnosis not present

## 2021-12-20 DIAGNOSIS — J3089 Other allergic rhinitis: Secondary | ICD-10-CM | POA: Diagnosis not present

## 2021-12-20 MED ORDER — METHYLPREDNISOLONE 4 MG PO TBPK: ORAL_TABLET | ORAL | 0 refills | Status: DC

## 2021-12-20 MED ORDER — DEXILANT 60 MG PO CPDR: 60.0000 mg | DELAYED_RELEASE_CAPSULE | Freq: Every day | ORAL | 11 refills | Status: DC

## 2021-12-20 MED ORDER — CEPHALEXIN 500 MG PO CAPS: 500.0000 mg | ORAL_CAPSULE | Freq: Four times a day (QID) | ORAL | 0 refills | Status: DC

## 2021-12-20 MED ORDER — TRIAMCINOLONE ACETONIDE 0.1 % EX OINT: TOPICAL_OINTMENT | Freq: Two times a day (BID) | CUTANEOUS | 1 refills | Status: AC

## 2021-12-20 NOTE — Assessment & Plan Note (Addendum)
Likely Dexilant or tanning spray or sunscreen component + sun exposure dermatitis Given Triamcinolone qid Medrol pac if not better

## 2021-12-20 NOTE — Assessment & Plan Note (Signed)
No need to be pre-medicated for root canal tomorrow PO abx if needed per her dentist - Rx given

## 2021-12-20 NOTE — Assessment & Plan Note (Signed)
On B12 

## 2021-12-20 NOTE — Assessment & Plan Note (Signed)
On Dexilant. Famotidine prn

## 2021-12-20 NOTE — Progress Notes (Signed)
Subjective:  Patient ID: [name], female    DOB: 1950-06-28  Age: 71 y.o. MRN: [id number]  CC: Follow-up and Atrial Fibrillation   HPI [name] presents for GERD C/o sunburn on the abd - worse w/rash and itching Asking to be pre-med - root canal tomorrow  Outpatient Medications Prior to Visit  Medication Sig Dispense Refill   ALPRAZolam (XANAX) 0.25 MG tablet 0.5-1 po bid prn anxiety 30 tablet 1   Botulinum Toxin Type A, Cosm, 50 units SOLR Inject 4-6 Units into the muscle. 3 month  injection     Cholecalciferol (VITAMIN D3 PO) Take 50 mcg by mouth daily.     Cyanocobalamin (B-12) 1000 MCG SUBL PLACE 1 TABLET (1,000 MCG TOTAL) UNDER THE TONGUE DAILY. 100 tablet 3   EPIPEN 2-PAK 0.3 MG/0.3ML SOAJ injection See admin instructions. Reported on 10/08/2015  1   EUCRISA 2 % OINT Apply topically. Apply 1 to 2 times a day     famotidine (PEPCID) 40 MG tablet Take 1 tablet (40 mg total) by mouth at bedtime. 30 tablet 2   loratadine (CLARITIN) 10 MG tablet TAKE 1 TABLET BY MOUTH EVERY DAY 90 tablet 1   mometasone (NASONEX) 50 MCG/ACT nasal spray 2 sprays in each nostril 3 each 3   Multiple Vitamins-Minerals (ICAPS) TABS Take 1 tablet by mouth 2 (two) times daily.     PREMARIN vaginal cream Use PV as directed 42.5 g 3   timolol (TIMOPTIC) 0.25 % ophthalmic solution SMARTSIG:In Eye(s)     tretinoin (RETIN-A) 0.05 % cream Apply 1 application topically at bedtime.     UNABLE TO FIND once a week. Med Name: allergy shots     valACYclovir (VALTREX) 500 MG tablet Take 1 tablet (500 mg total) by mouth 2 (two) times daily. 10 tablet 2   DEXILANT 60 MG capsule TAKE 1 CAPSULE BY MOUTH EVERY DAY 30 capsule 0   triamcinolone ointment (KENALOG) 0.1 % Apply topically 2 (two) times daily.     hyoscyamine (LEVSIN) 0.125 MG tablet Take 1 tablet (0.125 mg total) by mouth every 4 (four) hours as needed for up to 10 days (chest spasms). 100 tablet 3   No facility-administered medications prior  to visit.    ROS: Review of Systems  Constitutional:  Negative for activity change, appetite change, chills, fatigue and unexpected weight change.  HENT:  Negative for congestion, mouth sores and sinus pressure.   Eyes:  Negative for visual disturbance.  Respiratory:  Negative for cough and chest tightness.   Gastrointestinal:  Negative for abdominal pain and nausea.  Genitourinary:  Negative for difficulty urinating, frequency and vaginal pain.  Musculoskeletal:  Negative for back pain and gait problem.  Skin:  Positive for rash. Negative for pallor.  Neurological:  Negative for dizziness, tremors, weakness, numbness and headaches.  Psychiatric/Behavioral:  Negative for confusion and sleep disturbance.     Objective:  BP 116/78 (BP Location: Left Arm, Patient Position: Sitting, Cuff Size: Normal)   Pulse 64   Temp 98 F (36.7 C) (Oral)   Ht '5\' 2"'$  (1.575 m)   Wt 100 lb 12.8 oz (45.7 kg)   SpO2 98%   BMI 18.44 kg/m   BP Readings from Last 3 Encounters:  12/20/21 116/78  10/30/21 118/76  10/18/21 116/70    Wt Readings from Last 3 Encounters:  12/20/21 100 lb 12.8 oz (45.7 kg)  10/30/21 101 lb 9.6 oz (46.1 kg)  10/18/21 103 lb (46.7 kg)    Physical Exam  Constitutional:      General: She is not in acute distress.    Appearance: She is well-developed. She is obese.  HENT:     Head: Normocephalic.     Right Ear: External ear normal.     Left Ear: External ear normal.     Nose: Nose normal.  Eyes:     General:        Right eye: No discharge.        Left eye: No discharge.     Conjunctiva/sclera: Conjunctivae normal.     Pupils: Pupils are equal, round, and reactive to light.  Neck:     Thyroid: No thyromegaly.     Vascular: No JVD.     Trachea: No tracheal deviation.  Cardiovascular:     Rate and Rhythm: Normal rate and regular rhythm.     Heart sounds: Normal heart sounds.  Pulmonary:     Effort: No respiratory distress.     Breath sounds: No stridor. No  wheezing.  Abdominal:     General: Bowel sounds are normal. There is no distension.     Palpations: Abdomen is soft. There is no mass.     Tenderness: There is no abdominal tenderness. There is no guarding or rebound.  Musculoskeletal:        General: No tenderness.     Cervical back: Normal [name] of motion and neck supple. No rigidity.  Lymphadenopathy:     Cervical: No cervical adenopathy.  Skin:    Findings: Erythema and rash present.  Neurological:     Cranial Nerves: No cranial nerve deficit.     Motor: No abnormal muscle tone.     Coordination: Coordination normal.     Deep Tendon Reflexes: Reflexes normal.  Psychiatric:        Behavior: Behavior normal.        Thought Content: Thought content normal.        Judgment: Judgment normal.   Eryth papular rash on the stomach  Lab Results  Component Value Date   WBC 4.5 12/16/2020   HGB 12.9 12/16/2020   HCT 38.1 12/16/2020   PLT 170.0 12/16/2020   GLUCOSE 81 12/16/2020   CHOL 192 12/16/2020   TRIG 52.0 12/16/2020   HDL 74.30 12/16/2020   LDLCALC 107 (H) 12/16/2020   ALT 15 12/16/2020   AST 18 12/16/2020   NA 141 12/16/2020   K 4.6 12/16/2020   CL 104 12/16/2020   CREATININE 0.87 12/16/2020   BUN 23 12/16/2020   CO2 30 12/16/2020   TSH 1.61 12/16/2020    No results found.  Assessment & Plan:   Problem List Items Addressed This Visit     B12 deficiency    On B12      GERD (gastroesophageal reflux disease)    On Dexilant. Famotidine prn      Relevant Medications   DEXILANT 60 MG capsule   Rash and nonspecific skin eruption    Likely Dexilant or tanning spray or sunscreen component + sun exposure dermatitis Given Triamcinolone qid Medrol pac if not better      Toothache    No need to be pre-medicated for root canal tomorrow PO abx if needed per her dentist - Rx given         Meds ordered this encounter  Medications   DEXILANT 60 MG capsule    Sig: Take 1 capsule (60 mg total) by mouth daily.     Dispense:  30 capsule  Refill:  11   cephALEXin (KEFLEX) 500 MG capsule    Sig: Take 1 capsule (500 mg total) by mouth 4 (four) times daily.    Dispense:  28 capsule    Refill:  0   methylPREDNISolone (MEDROL DOSEPAK) 4 MG TBPK tablet    Sig: As directed    Dispense:  21 tablet    Refill:  0   triamcinolone ointment (KENALOG) 0.1 %    Sig: Apply topically 2 (two) times daily.    Dispense:  160 g    Refill:  1      Follow-up: Return for a follow-up visit.  [name], MD

## 2021-12-22 DIAGNOSIS — L249 Irritant contact dermatitis, unspecified cause: Secondary | ICD-10-CM | POA: Diagnosis not present

## 2021-12-22 DIAGNOSIS — L550 Sunburn of first degree: Secondary | ICD-10-CM | POA: Diagnosis not present

## 2021-12-22 DIAGNOSIS — L821 Other seborrheic keratosis: Secondary | ICD-10-CM | POA: Diagnosis not present

## 2021-12-25 DIAGNOSIS — J301 Allergic rhinitis due to pollen: Secondary | ICD-10-CM | POA: Diagnosis not present

## 2021-12-25 DIAGNOSIS — J3089 Other allergic rhinitis: Secondary | ICD-10-CM | POA: Diagnosis not present

## 2021-12-25 DIAGNOSIS — J3081 Allergic rhinitis due to animal (cat) (dog) hair and dander: Secondary | ICD-10-CM | POA: Diagnosis not present

## 2021-12-29 ENCOUNTER — Other Ambulatory Visit (HOSPITAL_COMMUNITY): Payer: Self-pay

## 2022-01-01 DIAGNOSIS — J301 Allergic rhinitis due to pollen: Secondary | ICD-10-CM | POA: Diagnosis not present

## 2022-01-01 DIAGNOSIS — J3081 Allergic rhinitis due to animal (cat) (dog) hair and dander: Secondary | ICD-10-CM | POA: Diagnosis not present

## 2022-01-01 DIAGNOSIS — J3089 Other allergic rhinitis: Secondary | ICD-10-CM | POA: Diagnosis not present

## 2022-01-05 ENCOUNTER — Ambulatory Visit: Payer: Medicare Other | Admitting: Internal Medicine

## 2022-01-08 DIAGNOSIS — J301 Allergic rhinitis due to pollen: Secondary | ICD-10-CM | POA: Diagnosis not present

## 2022-01-08 DIAGNOSIS — J3089 Other allergic rhinitis: Secondary | ICD-10-CM | POA: Diagnosis not present

## 2022-01-08 DIAGNOSIS — J3081 Allergic rhinitis due to animal (cat) (dog) hair and dander: Secondary | ICD-10-CM | POA: Diagnosis not present

## 2022-01-10 ENCOUNTER — Ambulatory Visit (INDEPENDENT_AMBULATORY_CARE_PROVIDER_SITE_OTHER): Payer: Medicare Other

## 2022-01-10 VITALS — BP 110/60 | HR 76 | Temp 97.6°F | Ht 62.0 in | Wt 101.6 lb

## 2022-01-10 DIAGNOSIS — Z0000 Encounter for general adult medical examination without abnormal findings: Secondary | ICD-10-CM

## 2022-01-10 DIAGNOSIS — Z13820 Encounter for screening for osteoporosis: Secondary | ICD-10-CM | POA: Diagnosis not present

## 2022-01-10 DIAGNOSIS — M8580 Other specified disorders of bone density and structure, unspecified site: Secondary | ICD-10-CM

## 2022-01-10 DIAGNOSIS — Z780 Asymptomatic menopausal state: Secondary | ICD-10-CM

## 2022-01-10 NOTE — Patient Instructions (Signed)
Ms. [name] , Thank you for taking time to come for your Medicare Wellness Visit. I appreciate your ongoing commitment to your health goals. Please review the following plan we discussed and let me know if I can assist you in the future.   Screening recommendations/referrals: Colonoscopy: 06/04/2013; due every 10 years Mammogram: 09/25/2021; due every year Bone Density: 01/13/2019; due every 2-3 years Recommended yearly ophthalmology/optometry visit for glaucoma screening and checkup Recommended yearly dental visit for hygiene and checkup  Vaccinations: Influenza vaccine: 04/19/2021; due Fall 2023 Pneumococcal vaccine: 05/21/2018, 04/21/2020 Tdap vaccine: 09/28/2015; due every 10 years Shingles vaccine: never done   Covid-19: 07/17/2019, 08/17/2019  Advanced directives: Yes  Conditions/risks identified: Yes  Next appointment: Please schedule your next Medicare Wellness Visit with your Nurse Health Advisor in 1 year by calling [phone].   Preventive Care 79 Years and Older, Female Preventive care refers to lifestyle choices and visits with your health care provider that can promote health and wellness. What does preventive care include? A yearly physical exam. This is also called an annual well check. Dental exams once or twice a year. Routine eye exams. Ask your health care provider how often you should have your eyes checked. Personal lifestyle choices, including: Daily care of your teeth and gums. Regular physical activity. Eating a healthy diet. Avoiding tobacco and drug use. Limiting alcohol use. Practicing safe sex. Taking low-dose aspirin every day. Taking vitamin and mineral supplements as recommended by your health care provider. What happens during an annual well check? The services and screenings done by your health care provider during your annual well check will depend on your age, overall health, lifestyle risk factors, and family history of disease. Counseling   Your health care provider may ask you questions about your: Alcohol use. Tobacco use. Drug use. Emotional well-being. Home and relationship well-being. Sexual activity. Eating habits. History of falls. Memory and ability to understand (cognition). Work and work Statistician. Reproductive health. Screening  You may have the following tests or measurements: Height, weight, and BMI. Blood pressure. Lipid and cholesterol levels. These may be checked every 5 years, or more frequently if you are over 47 years old. Skin check. Lung cancer screening. You may have this screening every year starting at age 58 if you have a 30-pack-year history of smoking and currently smoke or have quit within the past 15 years. Fecal occult blood test (FOBT) of the stool. You may have this test every year starting at age 52. Flexible sigmoidoscopy or colonoscopy. You may have a sigmoidoscopy every 5 years or a colonoscopy every 10 years starting at age 46. Hepatitis C blood test. Hepatitis B blood test. Sexually transmitted disease (STD) testing. Diabetes screening. This is done by checking your blood sugar (glucose) after you have not eaten for a while (fasting). You may have this done every 1-3 years. Bone density scan. This is done to screen for osteoporosis. You may have this done starting at age 1. Mammogram. This may be done every 1-2 years. Talk to your health care provider about how often you should have regular mammograms. Talk with your health care provider about your test results, treatment options, and if necessary, the need for more tests. Vaccines  Your health care provider may recommend certain vaccines, such as: Influenza vaccine. This is recommended every year. Tetanus, diphtheria, and acellular pertussis (Tdap, Td) vaccine. You may need a Td booster every 10 years. Zoster vaccine. You may need this after age 32. Pneumococcal 13-valent conjugate (PCV13) vaccine. One  dose is recommended  after age 67. Pneumococcal polysaccharide (PPSV23) vaccine. One dose is recommended after age 37. Talk to your health care provider about which screenings and vaccines you need and how often you need them. This information is not intended to replace advice given to you by your health care provider. Make sure you discuss any questions you have with your health care provider. Document Released: 07/01/2015 Document Revised: 02/22/2016 Document Reviewed: 04/05/2015 Elsevier Interactive Patient Education  2017 [employer] Prevention in the Home Falls can cause injuries. They can happen to people of all ages. There are many things you can do to make your home safe and to help prevent falls. What can I do on the outside of my home? Regularly fix the edges of walkways and driveways and fix any cracks. Remove anything that might make you trip as you walk through a door, such as a raised step or threshold. Trim any bushes or trees on the path to your home. Use bright outdoor lighting. Clear any walking paths of anything that might make someone trip, such as rocks or tools. Regularly check to see if handrails are loose or broken. Make sure that both sides of any steps have handrails. Any raised decks and porches should have guardrails on the edges. Have any leaves, snow, or ice cleared regularly. Use sand or salt on walking paths during winter. Clean up any spills in your garage right away. This includes oil or grease spills. What can I do in the bathroom? Use night lights. Install grab bars by the toilet and in the tub and shower. Do not use towel bars as grab bars. Use non-skid mats or decals in the tub or shower. If you need to sit down in the shower, use a plastic, non-slip stool. Keep the floor dry. Clean up any water that spills on the floor as soon as it happens. Remove soap buildup in the tub or shower regularly. Attach bath mats securely with double-sided non-slip rug tape. Do not  have throw rugs and other things on the floor that can make you trip. What can I do in the bedroom? Use night lights. Make sure that you have a light by your bed that is easy to reach. Do not use any sheets or blankets that are too big for your bed. They should not hang down onto the floor. Have a firm chair that has side arms. You can use this for support while you get dressed. Do not have throw rugs and other things on the floor that can make you trip. What can I do in the kitchen? Clean up any spills right away. Avoid walking on wet floors. Keep items that you use a lot in easy-to-reach places. If you need to reach something above you, use a strong step stool that has a grab bar. Keep electrical cords out of the way. Do not use floor polish or wax that makes floors slippery. If you must use wax, use non-skid floor wax. Do not have throw rugs and other things on the floor that can make you trip. What can I do with my stairs? Do not leave any items on the stairs. Make sure that there are handrails on both sides of the stairs and use them. Fix handrails that are broken or loose. Make sure that handrails are as long as the stairways. Check any carpeting to make sure that it is firmly attached to the stairs. Fix any carpet that is loose or worn.  Avoid having throw rugs at the top or bottom of the stairs. If you do have throw rugs, attach them to the floor with carpet tape. Make sure that you have a light switch at the top of the stairs and the bottom of the stairs. If you do not have them, ask someone to add them for you. What else can I do to help prevent falls? Wear shoes that: Do not have high heels. Have rubber bottoms. Are comfortable and fit you well. Are closed at the toe. Do not wear sandals. If you use a stepladder: Make sure that it is fully opened. Do not climb a closed stepladder. Make sure that both sides of the stepladder are locked into place. Ask someone to hold it for  you, if possible. Clearly mark and make sure that you can see: Any grab bars or handrails. First and last steps. Where the edge of each step is. Use tools that help you move around (mobility aids) if they are needed. These include: Canes. Walkers. Scooters. Crutches. Turn on the lights when you go into a dark area. Replace any light bulbs as soon as they burn out. Set up your furniture so you have a clear path. Avoid moving your furniture around. If any of your floors are uneven, fix them. If there are any pets around you, be aware of where they are. Review your medicines with your doctor. Some medicines can make you feel dizzy. This can increase your chance of falling. Ask your doctor what other things that you can do to help prevent falls. This information is not intended to replace advice given to you by your health care provider. Make sure you discuss any questions you have with your health care provider. Document Released: 03/31/2009 Document Revised: 11/10/2015 Document Reviewed: 07/09/2014 Elsevier Interactive Patient Education  2017 [employer].

## 2022-01-10 NOTE — Progress Notes (Signed)
Subjective:   [name] is a 71 y.o. female who presents for Medicare Annual (Subsequent) preventive examination.  Review of Systems     Cardiac Risk Factors include: advanced age (>40mn, >>51women);family history of premature cardiovascular disease     Objective:    Today's Vitals   01/10/22 1531  BP: 110/60  Pulse: 76  Temp: 97.6 F (36.4 C)  SpO2: 98%  Weight: 101 lb 9.6 oz (46.1 kg)  Height: '5\' 2"'$  (1.575 m)  PainSc: 0-No pain   Body mass index is 18.58 kg/m.     01/10/2022    4:17 PM 01/09/2021    2:00 PM 09/12/2016    8:34 AM 07/06/2015    9:34 AM 04/18/2015   12:09 PM 08/22/2014   12:38 PM 05/07/2014    8:49 AM  Advanced Directives  Does Patient Have a Medical Advance Directive? Yes Yes Yes Yes No No Yes  Type of Advance Directive Living will;Healthcare Power of Attorney Living will;Healthcare Power of Attorney Living [place]   Living will;Healthcare Power of Attorney  Does patient want to make changes to medical advance directive? No - Patient declined No - Patient declined No - Patient declined No - Patient declined     Copy of [place] Chart? No - copy requested No - copy requested  No - copy requested       Current Medications (verified) Outpatient Encounter Medications as of 01/10/2022  Medication Sig   ALPRAZolam (XANAX) 0.25 MG tablet 0.5-1 po bid prn anxiety   Botulinum Toxin Type A, Cosm, 50 units SOLR Inject 4-6 Units into the muscle. 3 month  injection   cephALEXin (KEFLEX) 500 MG capsule Take 1 capsule (500 mg total) by mouth 4 (four) times daily.   Cholecalciferol (VITAMIN D3 PO) Take 50 mcg by mouth daily.   Cyanocobalamin (B-12) 1000 MCG SUBL PLACE 1 TABLET (1,000 MCG TOTAL) UNDER THE TONGUE DAILY.   DEXILANT 60 MG capsule Take 1 capsule (60 mg total) by mouth daily.   EPIPEN 2-PAK 0.3 MG/0.3ML SOAJ injection See admin instructions. Reported on 10/08/2015   EUCRISA 2 % OINT Apply  topically. Apply 1 to 2 times a day   famotidine (PEPCID) 40 MG tablet Take 1 tablet (40 mg total) by mouth at bedtime.   hyoscyamine (LEVSIN) 0.125 MG tablet Take 1 tablet (0.125 mg total) by mouth every 4 (four) hours as needed for up to 10 days (chest spasms).   loratadine (CLARITIN) 10 MG tablet TAKE 1 TABLET BY MOUTH EVERY DAY   methylPREDNISolone (MEDROL DOSEPAK) 4 MG TBPK tablet As directed   mometasone (NASONEX) 50 MCG/ACT nasal spray 2 sprays in each nostril   Multiple Vitamins-Minerals (ICAPS) TABS Take 1 tablet by mouth 2 (two) times daily.   PREMARIN vaginal cream Use PV as directed   timolol (TIMOPTIC) 0.25 % ophthalmic solution SMARTSIG:In Eye(s)   tretinoin (RETIN-A) 0.05 % cream Apply 1 application topically at bedtime.   triamcinolone ointment (KENALOG) 0.1 % Apply topically 2 (two) times daily.   UNABLE TO FIND once a week. Med Name: allergy shots   valACYclovir (VALTREX) 500 MG tablet Take 1 tablet (500 mg total) by mouth 2 (two) times daily.   No facility-administered encounter medications on file as of 01/10/2022.    Allergies (verified) Codeine, Doxycycline, Epinephrine, Neomycin, Penicillins, Protonix [pantoprazole sodium], Sulfonamide derivatives, and Zithromax [azithromycin]   History: Past Medical History:  Diagnosis Date   Allergic rhinitis, cause unspecified  Backache, unspecified    Benign fasciculation-cramp syndrome 12/03/2012    Worsening with  stress, fatigue .   Calculus of kidney    Conversion disorder    Cotton wool spots    Esophageal reflux    Floater, vitreous, left 06-04-2016   GERD (gastroesophageal reflux disease)    Hemorrhage of rectum and anus    Internal hemorrhoids without mention of complication    Irritable bowel syndrome    Microscopic hematuria    Migraine headache with aura    Mild aortic insufficiency    Mild tricuspid regurgitation    Osteopenia    Other plastic surgery for unacceptable cosmetic appearance    Inj tx  fllers/expander   Personal history of urinary calculi    Postmenopausal atrophic vaginitis    Premature atrial contractions    PVC (premature ventricular contraction)    a. Event monitor 2013.   Unspecified constipation    Vasovagal syncope    Past Surgical History:  Procedure Laterality Date   BREAST BIOPSY Right    Cosmetic Procedures w/Injection therapy     SKIN CANCER EXCISION Left    thigh   WISDOM TOOTH EXTRACTION     Family History  Problem Relation Age of Onset   Hyperlipidemia Mother    Hypertension Mother    Hyperlipidemia Father    COPD Father        Smoker, deceased June 04, 2014   Hypertension Father    Hypertension Sister    Hyperlipidemia Sister    Headache Sister    Diabetes Neg Hx    Colon cancer Neg Hx    Breast cancer Neg Hx    Coronary artery disease Neg Hx    Esophageal cancer Neg Hx    Stomach cancer Neg Hx    Pancreatic cancer Neg Hx    Liver disease Neg Hx    Social History   Socioeconomic History   Marital status: Married    Spouse name: [name]   Number of children: 0   Years of education: 14   Highest education level: Not on file  Occupational History   Occupation: OFFICE liasion    Employer: [employer]   Occupation: Real Data processing manager   Occupation: OFFICE LEAZON    Employer: [employer]  Tobacco Use   Smoking status: Never   Smokeless tobacco: Never  Vaping Use   Vaping Use: Never used  Substance and Sexual Activity   Alcohol use: No   Drug use: No   Sexual activity: Yes    Birth control/protection: Post-menopausal  Other Topics Concern   Not on file  Social History Narrative   Vocational School in [place]. Married '90 - marriage is in very good shape '12, No children.  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 Offerred medical services for them if needed (Nov '11)   Patient is married [name]) and lives at home with her husband.   Patient  is working Administrator, arts work.   Patient has a high school education.   Patient is right-handed.   Patient does not drink any caffeine.      Social Determinants of Health   Financial Resource Strain: Low Risk  (01/10/2022)   Overall Financial Resource Strain (CARDIA)    Difficulty of Paying Living Expenses: Not hard at all  Food Insecurity: No Food Insecurity (01/10/2022)   Hunger Vital Sign    Worried About Running Out  of Food in the Last Year: Never true    [place] in the Last Year: Never true  Transportation Needs: No Transportation Needs (01/10/2022)   PRAPARE - Hydrologist (Medical): No    Lack of Transportation (Non-Medical): No  Physical Activity: Sufficiently Active (01/10/2022)   Exercise Vital Sign    Days of Exercise per Week: 7 days    Minutes of Exercise per Session: 60 min  Stress: No Stress Concern Present (01/10/2022)   [place]    Feeling of Stress : Not at all  Social Connections: [place] (01/10/2022)   Social Connection and Isolation Panel [NHANES]    Frequency of Communication with Friends and Family: More than three times a week    Frequency of Social Gatherings with Friends and Family: Once a week    Attends Religious Services: More than 4 times per year    Active Member of [employer] or Organizations: No    Attends Music therapist: More than 4 times per year    Marital Status: Married    Tobacco Counseling Counseling given: Not Answered   Clinical Intake:  Pre-visit preparation completed: Yes  Pain : No/denies pain Pain Score: 0-No pain     BMI - recorded: 18.58 Nutritional Status: BMI <19  Underweight Nutritional Risks: None Diabetes: No  How often do you need to have someone help you when you read instructions, pamphlets, or other written materials from your doctor or pharmacy?: 1 - Never What is the last  grade level you completed in school?: Vocational School Graduate  Diabetic? no  Interpreter Needed?: No  Information entered by :: Lisette Abu, LPN.   Activities of Daily Living    01/10/2022    4:19 PM  In your present state of health, do you have any difficulty performing the following activities:  Hearing? 0  Vision? 0  Difficulty concentrating or making decisions? 0  Walking or climbing stairs? 0  Dressing or bathing? 0  Doing errands, shopping? 0  Preparing Food and eating ? N  Using the Toilet? N  In the past six months, have you accidently leaked urine? N  Do you have problems with loss of bowel control? N  Managing your Medications? N  Managing your Finances? N  Housekeeping or managing your Housekeeping? N    Patient Care Team: Plotnikov, [name], MD as PCP - General (Internal Medicine) Burnell Blanks, MD as PCP - Cardiology (Cardiology) Constance Haw, MD as PCP - Electrophysiology (Cardiology) Dohmeier, Asencion Partridge, MD as Consulting Physician (Neurology) Irene Shipper, MD as Consulting Physician (Gastroenterology) Macarthur Critchley, [place] as Referring Physician (Optometry) Warden Fillers, MD as Consulting Physician (Ophthalmology)  Indicate any recent Medical Services you may have received from other than [facility] providers in the past year (date may be approximate).     Assessment:   This is a routine wellness examination for [place].  Hearing/Vision screen Hearing Screening - Comments:: Patient denied any hearing difficulty.   No hearing aids.  Vision Screening - Comments:: Patient does wear corrective lenses/contacts.  Eye exam done by: Warden Fillers, MD and Ilona Sorrel, OD.   Dietary issues and exercise activities discussed: Current Exercise Habits: Home exercise routine;Structured exercise class, Type of exercise: walking;treadmill;stretching;strength training/weights;Other - see comments (works with Physiological scientist once a week), Time  (Minutes): 60, Frequency (Times/Week): 7, Weekly Exercise (Minutes/Week): 420, Intensity: Moderate, Exercise limited by: None identified  Goals Addressed             This Visit's Progress    I have been thinking about doing competion again for weight lifting.        Depression Screen    01/10/2022    4:10 PM 12/20/2021    4:37 PM 10/12/2021    1:45 PM 01/09/2021    1:59 PM 07/11/2020    9:01 AM 09/17/2019    3:08 PM 09/23/2017    7:45 AM  PHQ 2/9 Scores  PHQ - 2 Score 0 0 0 0 0 0 0  PHQ- 9 Score       0    Fall Risk    12/20/2021    4:37 PM 10/12/2021    1:44 PM 01/09/2021    2:01 PM 04/11/2020    9:35 AM 01/19/2020    8:08 AM  [place] in the past year? 0 0 0 0 0  Number falls in past yr: 0 0 0  0  Injury with Fall? 0 0 0  0  Risk for fall due to : No Fall Risks No Fall Risks No Fall Risks    Follow up Falls evaluation completed Falls evaluation completed Falls evaluation completed      [place]:  Any stairs in or around the home? Yes  If so, are there any without handrails? No  Home free of loose throw rugs in walkways, pet beds, electrical cords, etc? Yes  Adequate lighting in your home to reduce risk of falls? Yes   ASSISTIVE DEVICES UTILIZED TO PREVENT FALLS:  Life alert? No  Use of a cane, walker or w/c? No  Grab bars in the bathroom? No  Shower chair or bench in shower? No  Elevated toilet seat or a handicapped toilet? No   TIMED UP AND GO:  Was the test performed? Yes .  Length of time to ambulate 10 feet: 6 sec.   Gait steady and fast without use of assistive device  Cognitive Function:        01/10/2022    4:21 PM  6CIT Screen  What Year? 0 points  What month? 0 points  What time? 0 points  Count back from 20 0 points  Months in reverse 0 points  Repeat phrase 0 points  Total Score 0 points    Immunizations Immunization History  Administered Date(s) Administered   Fluad Quad(high Dose 65+)  04/01/2019, 04/06/2020, 04/19/2021   Influenza Split 03/29/2011, 04/08/2012   Influenza Whole 04/09/2008, 04/12/2009, 04/10/2010   Influenza, High Dose Seasonal PF 05/06/2017, 04/14/2018, 04/16/2019, 04/21/2020   Influenza,inj,Quad PF,6+ Mos 04/08/2013, 03/22/2014, 04/08/2015, 04/09/2016, 04/08/2017   Moderna Sars-Covid-2 Vaccination 07/17/2019, 08/17/2019   Pneumococcal Conjugate-13 05/21/2018   Pneumococcal Polysaccharide-23 05/25/2019, 04/21/2020   Tdap 09/28/2015    TDAP status: Up to date  Flu Vaccine status: Up to date  Pneumococcal vaccine status: Up to date  Covid-19 vaccine status: Completed vaccines  Qualifies for Shingles Vaccine? Yes   Zostavax completed No   Shingrix Completed?: No.    Education has been provided regarding the importance of this vaccine. Patient has been advised to call insurance company to determine out of pocket expense if they have not yet received this vaccine. Advised may also receive vaccine at local pharmacy or Health Dept. Verbalized acceptance and understanding.  Screening Tests Health Maintenance  Topic Date Due   Zoster Vaccines- Shingrix (1 of 2) Never done   COVID-19  Vaccine (3 - Moderna risk series) 09/14/2019   INFLUENZA VACCINE  01/16/2022   MAMMOGRAM  09/26/2022   COLONOSCOPY (Pts 45-59yr Insurance coverage will need to be confirmed)  06/05/2023   TETANUS/TDAP  09/27/2025   Pneumonia Vaccine 71 Years old  Completed   DEXA SCAN  Completed   Hepatitis C Screening  Completed   HPV VACCINES  Aged Out    Health Maintenance  Health Maintenance Due  Topic Date Due   Zoster Vaccines- Shingrix (1 of 2) Never done   COVID-19 Vaccine (3 - Moderna risk series) 09/14/2019    Colorectal cancer screening: Type of screening: Colonoscopy. Completed 06/04/2013. Repeat every 10 years  Mammogram status: Completed 09/25/2021. Repeat every year  Bone Density status: Ordered 01/10/2022. Pt provided with contact info and advised to call to  schedule appt.  Lung Cancer Screening: (Low Dose CT Chest recommended if Age 69-80years, 30 pack-year currently smoking OR have quit w/in 15years.) does not qualify.   Lung Cancer Screening Referral: no  Additional Screening:  Hepatitis C Screening: does qualify; Completed 10/04/2015  Vision Screening: Recommended annual ophthalmology exams for early detection of glaucoma and other disorders of the eye. Is the patient up to date with their annual eye exam?  Yes  Who is the provider or what is the name of the office in which the patient attends annual eye exams? CWarden Fillers MD and JIlona Sorrel OD. If pt is not established with a provider, would they like to be referred to a provider to establish care? No .   Dental Screening: Recommended annual dental exams for proper oral hygiene  Community Resource Referral / Chronic Care Management: CRR required this visit?  No   CCM required this visit?  No      Plan:     I have personally reviewed and noted the following in the patient's chart:   Medical and social history Use of alcohol, tobacco or illicit drugs  Current medications and supplements including opioid prescriptions.  Functional ability and status Nutritional status Physical activity Advanced directives List of other physicians Hospitalizations, surgeries, and ER visits in previous 12 months Vitals Screenings to include cognitive, depression, and falls Referrals and appointments  In addition, I have reviewed and discussed with patient certain preventive protocols, quality metrics, and best practice recommendations. A written personalized care plan for preventive services as well as general preventive health recommendations were provided to patient.     SSheral Flow LPN   [id number]  Nurse Notes:  Hearing Screening - Comments:: Patient denied any hearing difficulty.   No hearing aids.  Vision Screening - Comments:: Patient does wear corrective  lenses/contacts.  Eye exam done by: CWarden Fillers MD and JIlona Sorrel OD.

## 2022-01-11 ENCOUNTER — Ambulatory Visit: Payer: Medicare Other

## 2022-01-15 ENCOUNTER — Other Ambulatory Visit: Payer: Medicare Other

## 2022-01-15 ENCOUNTER — Ambulatory Visit: Payer: Medicare Other

## 2022-01-18 DIAGNOSIS — J3081 Allergic rhinitis due to animal (cat) (dog) hair and dander: Secondary | ICD-10-CM | POA: Diagnosis not present

## 2022-01-18 DIAGNOSIS — J301 Allergic rhinitis due to pollen: Secondary | ICD-10-CM | POA: Diagnosis not present

## 2022-01-18 DIAGNOSIS — J3089 Other allergic rhinitis: Secondary | ICD-10-CM | POA: Diagnosis not present

## 2022-01-22 DIAGNOSIS — J3089 Other allergic rhinitis: Secondary | ICD-10-CM | POA: Diagnosis not present

## 2022-01-22 DIAGNOSIS — J301 Allergic rhinitis due to pollen: Secondary | ICD-10-CM | POA: Diagnosis not present

## 2022-01-22 DIAGNOSIS — J3081 Allergic rhinitis due to animal (cat) (dog) hair and dander: Secondary | ICD-10-CM | POA: Diagnosis not present

## 2022-01-23 ENCOUNTER — Other Ambulatory Visit (INDEPENDENT_AMBULATORY_CARE_PROVIDER_SITE_OTHER): Payer: Medicare Other

## 2022-01-23 DIAGNOSIS — E538 Deficiency of other specified B group vitamins: Secondary | ICD-10-CM

## 2022-01-23 DIAGNOSIS — Z0000 Encounter for general adult medical examination without abnormal findings: Secondary | ICD-10-CM

## 2022-01-23 DIAGNOSIS — F411 Generalized anxiety disorder: Secondary | ICD-10-CM

## 2022-01-23 DIAGNOSIS — Z136 Encounter for screening for cardiovascular disorders: Secondary | ICD-10-CM | POA: Diagnosis not present

## 2022-01-23 DIAGNOSIS — G43109 Migraine with aura, not intractable, without status migrainosus: Secondary | ICD-10-CM | POA: Diagnosis not present

## 2022-01-23 LAB — CBC WITH DIFFERENTIAL/PLATELET
Basophils Absolute: 0.1 10*3/uL (ref 0.0–0.1)
Basophils Relative: 2 % (ref 0.0–3.0)
Eosinophils Absolute: 0.2 10*3/uL (ref 0.0–0.7)
Eosinophils Relative: 5.6 % — ABNORMAL HIGH (ref 0.0–5.0)
HCT: 36.4 % (ref 36.0–46.0)
Hemoglobin: 12.4 g/dL (ref 12.0–15.0)
Lymphocytes Relative: 35 % (ref 12.0–46.0)
Lymphs Abs: 1.2 10*3/uL (ref 0.7–4.0)
MCHC: 34 g/dL (ref 30.0–36.0)
MCV: 91.9 fl (ref 78.0–100.0)
Monocytes Absolute: 0.3 10*3/uL (ref 0.1–1.0)
Monocytes Relative: 9.2 % (ref 3.0–12.0)
Neutro Abs: 1.7 10*3/uL (ref 1.4–7.7)
Neutrophils Relative %: 48.2 % (ref 43.0–77.0)
Platelets: 179 10*3/uL (ref 150.0–400.0)
RBC: 3.96 Mil/uL (ref 3.87–5.11)
RDW: 12.9 % (ref 11.5–15.5)
WBC: 3.5 10*3/uL — ABNORMAL LOW (ref 4.0–10.5)

## 2022-01-23 LAB — URINALYSIS
Bilirubin Urine: NEGATIVE
Ketones, ur: NEGATIVE
Leukocytes,Ua: NEGATIVE
Nitrite: NEGATIVE
Specific Gravity, Urine: 1.02 (ref 1.000–1.030)
Total Protein, Urine: NEGATIVE
Urine Glucose: NEGATIVE
Urobilinogen, UA: 0.2 (ref 0.0–1.0)
pH: 6 (ref 5.0–8.0)

## 2022-01-23 LAB — COMPREHENSIVE METABOLIC PANEL WITH GFR
ALT: 29 U/L (ref 0–35)
AST: 26 U/L (ref 0–37)
Albumin: 4.3 g/dL (ref 3.5–5.2)
Alkaline Phosphatase: 60 U/L (ref 39–117)
BUN: 18 mg/dL (ref 6–23)
CO2: 28 meq/L (ref 19–32)
Calcium: 9.5 mg/dL (ref 8.4–10.5)
Chloride: 103 meq/L (ref 96–112)
Creatinine, Ser: 0.81 mg/dL (ref 0.40–1.20)
GFR: 74.15 mL/min
Glucose, Bld: 81 mg/dL (ref 70–99)
Potassium: 4.1 meq/L (ref 3.5–5.1)
Sodium: 142 meq/L (ref 135–145)
Total Bilirubin: 0.7 mg/dL (ref 0.2–1.2)
Total Protein: 7.1 g/dL (ref 6.0–8.3)

## 2022-01-23 LAB — LIPID PANEL
Cholesterol: 206 mg/dL — ABNORMAL HIGH (ref 0–200)
HDL: 64.4 mg/dL (ref >39.00)
LDL Cholesterol: 129 mg/dL — ABNORMAL HIGH (ref 0–99)
NonHDL: 141.76
Total CHOL/HDL Ratio: 3
Triglycerides: 62 mg/dL (ref 0.0–149.0)
VLDL: 12.4 mg/dL (ref 0.0–40.0)

## 2022-01-23 LAB — TSH: TSH: 1.54 u[IU]/mL (ref 0.35–5.50)

## 2022-01-27 ENCOUNTER — Other Ambulatory Visit: Payer: Self-pay | Admitting: Internal Medicine

## 2022-02-01 ENCOUNTER — Ambulatory Visit: Payer: Medicare Other | Admitting: Podiatry

## 2022-02-02 DIAGNOSIS — J3081 Allergic rhinitis due to animal (cat) (dog) hair and dander: Secondary | ICD-10-CM | POA: Diagnosis not present

## 2022-02-02 DIAGNOSIS — J301 Allergic rhinitis due to pollen: Secondary | ICD-10-CM | POA: Diagnosis not present

## 2022-02-02 DIAGNOSIS — J3089 Other allergic rhinitis: Secondary | ICD-10-CM | POA: Diagnosis not present

## 2022-02-08 DIAGNOSIS — J3081 Allergic rhinitis due to animal (cat) (dog) hair and dander: Secondary | ICD-10-CM | POA: Diagnosis not present

## 2022-02-08 DIAGNOSIS — J301 Allergic rhinitis due to pollen: Secondary | ICD-10-CM | POA: Diagnosis not present

## 2022-02-08 DIAGNOSIS — J3089 Other allergic rhinitis: Secondary | ICD-10-CM | POA: Diagnosis not present

## 2022-02-13 ENCOUNTER — Ambulatory Visit: Payer: Medicare Other | Admitting: Internal Medicine

## 2022-02-13 DIAGNOSIS — J3089 Other allergic rhinitis: Secondary | ICD-10-CM | POA: Diagnosis not present

## 2022-02-13 DIAGNOSIS — J3081 Allergic rhinitis due to animal (cat) (dog) hair and dander: Secondary | ICD-10-CM | POA: Diagnosis not present

## 2022-02-13 DIAGNOSIS — J301 Allergic rhinitis due to pollen: Secondary | ICD-10-CM | POA: Diagnosis not present

## 2022-02-14 ENCOUNTER — Ambulatory Visit: Payer: Medicare Other | Admitting: Podiatry

## 2022-02-14 ENCOUNTER — Encounter: Payer: Self-pay | Admitting: Internal Medicine

## 2022-02-16 ENCOUNTER — Other Ambulatory Visit: Payer: Self-pay | Admitting: Internal Medicine

## 2022-02-16 ENCOUNTER — Ambulatory Visit: Payer: Medicare Other | Admitting: Internal Medicine

## 2022-02-16 DIAGNOSIS — N959 Unspecified menopausal and perimenopausal disorder: Secondary | ICD-10-CM

## 2022-02-16 DIAGNOSIS — J3089 Other allergic rhinitis: Secondary | ICD-10-CM | POA: Diagnosis not present

## 2022-02-20 DIAGNOSIS — J301 Allergic rhinitis due to pollen: Secondary | ICD-10-CM | POA: Diagnosis not present

## 2022-02-20 DIAGNOSIS — J3089 Other allergic rhinitis: Secondary | ICD-10-CM | POA: Diagnosis not present

## 2022-02-20 DIAGNOSIS — J3081 Allergic rhinitis due to animal (cat) (dog) hair and dander: Secondary | ICD-10-CM | POA: Diagnosis not present

## 2022-02-21 DIAGNOSIS — H16143 Punctate keratitis, bilateral: Secondary | ICD-10-CM | POA: Diagnosis not present

## 2022-02-21 DIAGNOSIS — H04123 Dry eye syndrome of bilateral lacrimal glands: Secondary | ICD-10-CM | POA: Diagnosis not present

## 2022-02-21 DIAGNOSIS — H1045 Other chronic allergic conjunctivitis: Secondary | ICD-10-CM | POA: Diagnosis not present

## 2022-02-21 DIAGNOSIS — G43B0 Ophthalmoplegic migraine, not intractable: Secondary | ICD-10-CM | POA: Diagnosis not present

## 2022-02-21 DIAGNOSIS — H2513 Age-related nuclear cataract, bilateral: Secondary | ICD-10-CM | POA: Diagnosis not present

## 2022-02-21 DIAGNOSIS — H43812 Vitreous degeneration, left eye: Secondary | ICD-10-CM | POA: Diagnosis not present

## 2022-02-21 DIAGNOSIS — H11122 Conjunctival concretions, left eye: Secondary | ICD-10-CM | POA: Diagnosis not present

## 2022-02-21 DIAGNOSIS — H11442 Conjunctival cysts, left eye: Secondary | ICD-10-CM | POA: Diagnosis not present

## 2022-02-21 DIAGNOSIS — H35361 Drusen (degenerative) of macula, right eye: Secondary | ICD-10-CM | POA: Diagnosis not present

## 2022-02-21 DIAGNOSIS — H43811 Vitreous degeneration, right eye: Secondary | ICD-10-CM | POA: Diagnosis not present

## 2022-02-21 DIAGNOSIS — H43393 Other vitreous opacities, bilateral: Secondary | ICD-10-CM | POA: Diagnosis not present

## 2022-02-22 ENCOUNTER — Encounter: Payer: Self-pay | Admitting: Cardiovascular Disease

## 2022-02-22 DIAGNOSIS — R42 Dizziness and giddiness: Secondary | ICD-10-CM | POA: Insufficient documentation

## 2022-02-23 DIAGNOSIS — M8589 Other specified disorders of bone density and structure, multiple sites: Secondary | ICD-10-CM | POA: Diagnosis not present

## 2022-02-23 DIAGNOSIS — Z780 Asymptomatic menopausal state: Secondary | ICD-10-CM | POA: Diagnosis not present

## 2022-02-23 DIAGNOSIS — M810 Age-related osteoporosis without current pathological fracture: Secondary | ICD-10-CM | POA: Diagnosis not present

## 2022-02-26 DIAGNOSIS — J3081 Allergic rhinitis due to animal (cat) (dog) hair and dander: Secondary | ICD-10-CM | POA: Diagnosis not present

## 2022-02-26 DIAGNOSIS — J301 Allergic rhinitis due to pollen: Secondary | ICD-10-CM | POA: Diagnosis not present

## 2022-02-26 DIAGNOSIS — J3089 Other allergic rhinitis: Secondary | ICD-10-CM | POA: Diagnosis not present

## 2022-02-28 ENCOUNTER — Ambulatory Visit: Payer: Medicare Other | Admitting: Podiatry

## 2022-03-07 ENCOUNTER — Ambulatory Visit: Payer: Medicare Other | Admitting: Internal Medicine

## 2022-03-08 ENCOUNTER — Encounter: Payer: Self-pay | Admitting: Internal Medicine

## 2022-03-08 DIAGNOSIS — J3089 Other allergic rhinitis: Secondary | ICD-10-CM | POA: Diagnosis not present

## 2022-03-08 DIAGNOSIS — J301 Allergic rhinitis due to pollen: Secondary | ICD-10-CM | POA: Diagnosis not present

## 2022-03-08 DIAGNOSIS — J3081 Allergic rhinitis due to animal (cat) (dog) hair and dander: Secondary | ICD-10-CM | POA: Diagnosis not present

## 2022-03-12 NOTE — Telephone Encounter (Signed)
Patient is calling in wanting an update, looks like results are in under media.

## 2022-03-13 DIAGNOSIS — J3089 Other allergic rhinitis: Secondary | ICD-10-CM | POA: Diagnosis not present

## 2022-03-13 DIAGNOSIS — J3081 Allergic rhinitis due to animal (cat) (dog) hair and dander: Secondary | ICD-10-CM | POA: Diagnosis not present

## 2022-03-13 DIAGNOSIS — J301 Allergic rhinitis due to pollen: Secondary | ICD-10-CM | POA: Diagnosis not present

## 2022-03-14 ENCOUNTER — Encounter: Payer: Self-pay | Admitting: Internal Medicine

## 2022-03-14 ENCOUNTER — Ambulatory Visit (INDEPENDENT_AMBULATORY_CARE_PROVIDER_SITE_OTHER): Payer: Medicare Other | Admitting: Internal Medicine

## 2022-03-14 VITALS — BP 126/64 | HR 62 | Ht 61.5 in | Wt 101.0 lb

## 2022-03-14 DIAGNOSIS — K5900 Constipation, unspecified: Secondary | ICD-10-CM

## 2022-03-14 DIAGNOSIS — K219 Gastro-esophageal reflux disease without esophagitis: Secondary | ICD-10-CM

## 2022-03-14 NOTE — Patient Instructions (Signed)
_______________________________________________________  If you are age 69 or older, your body mass index should be between 23-30. Your Body mass index is 18.77 kg/m. If this is out of the aforementioned range listed, please consider follow up with your Primary Care Provider.  If you are age 62 or younger, your body mass index should be between 19-25. Your Body mass index is 18.77 kg/m. If this is out of the aformentioned range listed, please consider follow up with your Primary Care Provider.   ________________________________________________________  The [facility] providers would like to encourage you to use [facility] to communicate with providers for non-urgent requests or questions.  Due to long hold times on the telephone, sending your provider a message by [facility] may be a faster and more efficient way to get a response.  Please allow 48 business hours for a response.  Please remember that this is for non-urgent requests.  _______________________________________________________  Please follow up as needed

## 2022-03-14 NOTE — Progress Notes (Signed)
HISTORY OF PRESENT ILLNESS:  [name] is a 71 y.o. female with past medical history as listed below.  She presents today with questions regarding chronic PPI use and its relationship to bone density abnormalities as well as a best regimen for her constipation management.  She was last seen in this office June 2022 by the GI physician assistant regarding chronic GERD and laryngopharyngeal reflux.  At that time she reduced her Dexilant from 60 mg daily to 30 mg daily and had significant reflux symptoms.  Increasing her Dexilant back to 60 mg resulted in resolution of symptoms.  She also had significant laryngeal reflux with difficulty breathing for which she was evaluated by pulmonary.  PPI recommended.  Patient tells me that she was recently diagnosed with osteoporosis.  She wonders about PPI use and its relationship to osteoporosis.  She reports excellent control of her reflux and laryngal reflux symptoms currently.  She did undergo an esophagram and December 2019 for complaints of throat tightness.  This was normal.  In terms of constipation, she states she manages this with prunes.  However, she does not like prunes.  She wonders about fiber supplements such as Metamucil or Citrucel.  Last complete colonoscopy December 2014 was normal.  Follow-up in 10 years recommended.  Review of blood work from August 2023 shows normal comprehensive metabolic panel and unremarkable CBC with hemoglobin 12.4.  Normal TSH of 1.54.  REVIEW OF SYSTEMS:  All non-GI ROS negative unless otherwise stated in the HPI.  Past Medical History:  Diagnosis Date   Allergic rhinitis, cause unspecified    Backache, unspecified    Benign fasciculation-cramp syndrome 12/03/2012    Worsening with  stress, fatigue .   Calculus of kidney    Conversion disorder    Cotton wool spots    Esophageal reflux    Floater, vitreous, left 05/2016   GERD (gastroesophageal reflux disease)    Hemorrhage of rectum and anus    Internal  hemorrhoids without mention of complication    Irritable bowel syndrome    Microscopic hematuria    Migraine headache with aura    Mild aortic insufficiency    Mild tricuspid regurgitation    Osteopenia    Other plastic surgery for unacceptable cosmetic appearance    Inj tx fllers/expander   Personal history of urinary calculi    Postmenopausal atrophic vaginitis    Premature atrial contractions    PVC (premature ventricular contraction)    a. Event monitor 2013.   Unspecified constipation    Vasovagal syncope     Past Surgical History:  Procedure Laterality Date   BREAST BIOPSY Right    Cosmetic Procedures w/Injection therapy     SKIN CANCER EXCISION Left    thigh   WISDOM TOOTH EXTRACTION      Social History [name]  reports that she has never smoked. She has never used smokeless tobacco. She reports that she does not drink alcohol and does not use drugs.  family history includes COPD in her father; Headache in her sister; Hyperlipidemia in her father, mother, and sister; Hypertension in her father, mother, and sister.  Allergies  Allergen Reactions   Codeine Other (See Comments)    Doesn't remeber   Doxycycline Other (See Comments)   Epinephrine     Heart racing    Neomycin     Topical rash from cream   Penicillins Hives and Other (See Comments)    Has patient had a PCN reaction causing immediate rash, facial/tongue/throat  swelling, SOB or lightheadedness with hypotension: Yes Has patient had a PCN reaction causing severe rash involving mucus membranes or skin necrosis: Yes Has patient had a PCN reaction that required hospitalization No Has patient had a PCN reaction occurring within the last 10 years: No If all of the above answers are "NO", then may proceed with Cephalosporin use.    Protonix [Pantoprazole Sodium]     ?HAs   Sulfonamide Derivatives Other (See Comments)    Doesn't remember    Zithromax [Azithromycin] Rash       PHYSICAL  EXAMINATION: Vital signs: BP 126/64   Pulse 62   Ht 5' 1.5" (1.562 m)   Wt 101 lb (45.8 kg)   BMI 18.77 kg/m   Constitutional: Thin but generally well-appearing, no acute distress Psychiatric: alert and oriented x3, cooperative Eyes: extraocular movements intact, anicteric, conjunctiva pink Mouth: oral pharynx moist, no lesions Neck: supple no lymphadenopathy Cardiovascular: heart regular rate and rhythm, no murmur Lungs: clear to auscultation bilaterally Abdomen: soft, nontender, nondistended, no obvious ascites, no peritoneal signs, normal bowel sounds, no organomegaly Rectal: Omitted Extremities: no clubbing, cyanosis, or lower extremity edema bilaterally Skin: no lesions on visible extremities Neuro: No focal deficits.  Cranial nerves intact  ASSESSMENT:  1.  Chronic GERD and laryngopharyngeal reflux.  Symptoms managed on Dexilant 60 mg daily.  Lower dose PPI ineffective. 2.  Questions regarding PPI and bone density 3.  Functional constipation 4.  Normal colonoscopy 2014   PLAN:  1.  Reflux precautions 2.  Would continue PPI given her significant symptomatic state with lower dose PPI or off PPI.  I also reviewed current database regarding PPI use and bone density effects.  The data is not strong that PPIs resulted in osteoporosis.  Her risk factors for osteoporosis are being a very thin white female age 82. 37.  If the patient wants to try daily fiber supplementation, I have recommended Citrucel 2 tablespoons with plenty of water. 4.  Repeat screening colonoscopy December 2024 5.  Interval follow-up as needed 6.  Return to Dr. Alain [name] regarding medical management of osteoporosis A total time of 30 minutes was spent preparing to see the patient, reviewing data, obtaining comprehensive history, performing medically appropriate physical examination, counseling and educating the patient regarding the above listed issues, answering multiple excellent questions, and documenting  clinical information in the health record

## 2022-03-14 NOTE — Progress Notes (Signed)
ocn

## 2022-03-15 ENCOUNTER — Ambulatory Visit: Payer: Medicare Other | Admitting: Family Medicine

## 2022-03-23 DIAGNOSIS — J3081 Allergic rhinitis due to animal (cat) (dog) hair and dander: Secondary | ICD-10-CM | POA: Diagnosis not present

## 2022-03-23 DIAGNOSIS — J301 Allergic rhinitis due to pollen: Secondary | ICD-10-CM | POA: Diagnosis not present

## 2022-03-23 DIAGNOSIS — J3089 Other allergic rhinitis: Secondary | ICD-10-CM | POA: Diagnosis not present

## 2022-03-26 DIAGNOSIS — J3089 Other allergic rhinitis: Secondary | ICD-10-CM | POA: Diagnosis not present

## 2022-03-26 DIAGNOSIS — J3081 Allergic rhinitis due to animal (cat) (dog) hair and dander: Secondary | ICD-10-CM | POA: Diagnosis not present

## 2022-03-26 DIAGNOSIS — J301 Allergic rhinitis due to pollen: Secondary | ICD-10-CM | POA: Diagnosis not present

## 2022-03-29 ENCOUNTER — Ambulatory Visit (INDEPENDENT_AMBULATORY_CARE_PROVIDER_SITE_OTHER): Payer: Medicare Other

## 2022-03-29 DIAGNOSIS — Z23 Encounter for immunization: Secondary | ICD-10-CM

## 2022-04-02 DIAGNOSIS — Z1231 Encounter for screening mammogram for malignant neoplasm of breast: Secondary | ICD-10-CM | POA: Diagnosis not present

## 2022-04-02 LAB — HM MAMMOGRAPHY

## 2022-04-04 ENCOUNTER — Encounter: Payer: Self-pay | Admitting: Internal Medicine

## 2022-04-06 DIAGNOSIS — J301 Allergic rhinitis due to pollen: Secondary | ICD-10-CM | POA: Diagnosis not present

## 2022-04-06 DIAGNOSIS — J3081 Allergic rhinitis due to animal (cat) (dog) hair and dander: Secondary | ICD-10-CM | POA: Diagnosis not present

## 2022-04-06 DIAGNOSIS — J3089 Other allergic rhinitis: Secondary | ICD-10-CM | POA: Diagnosis not present

## 2022-04-09 DIAGNOSIS — J3089 Other allergic rhinitis: Secondary | ICD-10-CM | POA: Diagnosis not present

## 2022-04-09 DIAGNOSIS — J3081 Allergic rhinitis due to animal (cat) (dog) hair and dander: Secondary | ICD-10-CM | POA: Diagnosis not present

## 2022-04-09 DIAGNOSIS — J301 Allergic rhinitis due to pollen: Secondary | ICD-10-CM | POA: Diagnosis not present

## 2022-04-11 ENCOUNTER — Encounter: Payer: Self-pay | Admitting: Neurology

## 2022-04-11 ENCOUNTER — Ambulatory Visit (INDEPENDENT_AMBULATORY_CARE_PROVIDER_SITE_OTHER): Payer: Medicare Other | Admitting: Neurology

## 2022-04-11 VITALS — BP 124/73 | HR 80 | Ht 61.5 in | Wt 103.4 lb

## 2022-04-11 DIAGNOSIS — M5010 Cervical disc disorder with radiculopathy, unspecified cervical region: Secondary | ICD-10-CM

## 2022-04-11 DIAGNOSIS — R253 Fasciculation: Secondary | ICD-10-CM

## 2022-04-11 DIAGNOSIS — G43109 Migraine with aura, not intractable, without status migrainosus: Secondary | ICD-10-CM | POA: Diagnosis not present

## 2022-04-11 DIAGNOSIS — I340 Nonrheumatic mitral (valve) insufficiency: Secondary | ICD-10-CM

## 2022-04-11 DIAGNOSIS — I351 Nonrheumatic aortic (valve) insufficiency: Secondary | ICD-10-CM

## 2022-04-11 NOTE — Progress Notes (Signed)
[facility]  Provider:  Dr Brett Fairy Referring Provider: [name], [name], MD Primary Care Physician:  Cassandria Anger, MD    HPI:  Nadra Hritz is a 71 y.o. female here as a patient of  Dr. [name] for follow up on her beningn fasciculations and occipital neuralgia, vertigo-headaches. ENT Dr Constance Holster has recommended a [employer] . RM 10, alone. Doing well from neurology standpoint. Had bone density test via PCP. Found osteoporosis in femoral neck. Has PCP appt for f/u on this early November. She considers joining Osteo -strong. She has been weight training for 20 years, and takes vit D. She may need to consider medication treatment with Dr [name].  No recent fasciculations. Migraine AURAS persist without migraine to follow. No nausea.    Mrs. Ems has reported some intermittent palpitations be had intermittent palpitations discussed even in October 2018 but she feels that at this time they have been more common after her COVID vaccination with [facility] and for the last 4 months she has been symptom-free in that regard.  As I have mentioned before she no longer takes any caffeine she does not drink alcohol and she has occasional but rather rare benign fasciculations.  She is doing very well overall and we need once a year to address any questions that come up she sometimes reports auras but she no longer has daily headaches following these.    RV 04-11-2020: CT head in February 2021 was negative. CT cervical spine showed C4-5 foraminal narrowing, bone spurs, and DDD.  Her headaches have done better. She presented me a video of her neck facilitating - benign, no ALS, as we have many times discussed before. She has noted a correlation between caffeine and fasciculation. She is active at [facility]- and she is fully vaccinated.     RV  04-09-2019, retired from apartment managing, husband retired Emergency planning/management officer.  She still reports auras but no headaches after these.  Rare left eye / temple headache 2 times a mont and these responds to Motrin within hours.  She has benign fasciculations. Takes cosmetic Botox and feels it helps with headches. I will arrange for Cefaly device and perhaps TMS procedure, depending on costs.   I see this patient today , 04-07-2018, in a yearly follow up-  Her husband just retired this Summer, is home.  She had a visual field restriction on one day,  Occurrence on August 7th 2019 - she had seen ophthalmology on call ,followed by her regular eye specialist , [name]. Than was send for carotid doppler , which revealed wide open arteries ( Dr. Angelena Form). On 02-28-2018 she suddenly felt a severe neck pain. She had reached for something on the floor of her car- The pain went away, but the tenderness remained over the right occipital area. She wonders about occipital neuralgia. She still has benign fasciculations. She still takes omeprazole.   I had the pleasure of seeing Mrs. Ravalli today on 04/01/2017, she has noticed that she has more headaches since taking pantoprazole. She started an every other day regimen and noted that headaches aren't changing according to the day of intake. She still struggles with reflux however and is awaiting appointments with Dr. Dorann Lodge often Dr. Henrene Pastor to address his underlying condition. Reflux causes her to cough at night and in daytime. She has also noted some heart palpitations, transiently reported substernal chest wall fasciculations that seemed to have resolved as spontaneously as they came.  I have the pleasure of seeing Mrs. Vetere  today on 12/18/2016. She reports that she has still freedom from headaches, her fasciculations have much resolved, the lifestyle changes she told me about in 2017 have really helped her. She does have some neck arthritis and she has noticed that sometimes pain seems to radiate from the neck towards the head. Headaches may be announced by an AURA, nausea, resolve with motrin,  can be associated with phonophobia.  We wondered if we should obtain an MRI brain and cervical spine at the same time, I would be very much in favor of this, and Mrs. Scholten will still think about. In addition she reports that her husband plans to retire next year. She has noted that she sleeps best when alone. Her husband is a Insurance underwriter and is often not at home, she is worried that when they finally lift and sleep together in the same room that would be more interruption of her sleep but also her snoring may interrupt his sleep. For this reason we will undergo a home sleep test to see if she has apnea or is truly just snoring. Again, most of the night she will spend alone and there is no witness to her asleep. She continues to be physically active, with a low body mass index and no medical conditions of concern at this time.   HPI- beginning 2012 :  The patient reports that she had 2 spells of near-syncope one in May 2014 and one in June of 2014 . She also has a history of migraines with aura, benign fasciculations which have affected also facial muscles, at times presenting like a tic. The benign fasciculations have been present for over 2 decades and are not related to a minor atrophic lateral sclerosis. Pulse of the near-syncope spells but the patient reported had a diplopia component and appeared to have been experienced as a vertical. The patient reports that the first spell was likely related to dehydration but is not quite sure about the second. The spells don't last long and if she takes fluid and fluid they quickly resolve and do not return. She was sent by her primary care physician for an MRI of the brain which returned normal. She has been seen by [name]  for the brief dizziness, loss of vision , diplopia. She is diagnosed of with macular degeneration early stage , myopia, and ocular migraines.  Vision was 20/25 in both eyes, ocular  pressure was 18 in the right, and 19 mm in the left eye and Dr.  [name]  reported that the patient is early cataract formation but she can see well.The optic nerves are healthy , but there is a macular drusen in the right eye he recommended ocular vitamins and to continue contact lens use the patient was otherwise found to be in good health ,and he did not impose any restrictions on driving activities of daily living etc. The detailed report is reviewed here today as is her MRI report which was entirely normal. She has meanwhile reduced or eliminated her caffeine intake and noticed a reduction in twitches. Her results of the EMG and NCV study have helped her to relax and fee safe with her condition. She is snoring now, which doesn't bother her but her spouse.  She has noted a feeling of thumping at the right ear and her ENT suggested that this may be a manifestation of her fasciculations. Her parents lives in [place], [place]. Close to her sister, her father is in a rehab facility. Her husband is an  Emergency planning/management officer, now with [employer].   09-13-2015 Benign fasciculations resolved !  Patient presents now with an ear pain, left side only. Nothing visible on the left ear.  Her orthodontist noted some TMJ  click.  At the same time she had half sided headaches. These were not neuralgic and have resolved.  The patient does not recall having any zoster outbreaks, she did have some aphtose stomatitis non-herpetic. Dentist found no herpetic lesions and Crista Luria tested for zoster- negative. Since she does have a slight click at the left temporomandibular joint I recommend that she will purchase a baby toothbrush, one of those that are worn on top of the index finger like a thimble, and massages from the inside the area of the TMJ joint, she can use it on the outside as well.  This helps TMJ pain and click to disappear quicker. At this time I'm just happy that her symptoms have resolved and I'm happy to see her for any recurrent issues or new problems in the future.  02-15-2016,  Mrs. Obriant has experienced an isolated event of diplopia that she related to the left eye. She also has a recurrence of migrainous headaches but they're all a has changed. She used to experience fortification lines which is  now associated with a very bright light perception. The visual aura the last 20-30 minutes. A lot of times there is no headache to follow. She experienced the diplopia before she saw her ophthalmologist [name], who then ordered a carotid Doppler study which returned with clinically insignificant results. Today, her headache is more like a pressure. She continued allergy shots with [name]. A few years ago she has experienced vertical diplopia, and MRI in 2014  was negative- ordered by Dr [name] at the time  . She may take 3 Motrin in a week. I explained that migraines can change to this state with reaching the 5th decade of life. Many women that experienced severe nausea and headaches associated with menstrual cycles, has later in life the aura  without headaches to follow . Is benign fasciculations there is truly not changed there is no treatment available nor is it necessary. Is important for the patient to stay hydrated and could not get hypoglycemic as these will lead to presyncopal spells as well as an aunt who took and frequency rise in fasciculations, an  asked her to restrict caffeine intake.  03-27-2016,  patient reports resolution of headaches after quitting chocolate covered almonds ! No more diplopia , rare fasciculation. All tests were normal.     Review of Systems: Out of a complete 14 system review, the patient complains of only the following symptoms, and all other reviewed systems are negative. Less twitching.   She reports no changes in handwriting,  Voice , or  swallowing. No recent fasciculations, some neck cfackling with ROM.   Social History   Socioeconomic History   Marital status: Married    Spouse name: [name]   Number of children: 0    Years of education: 14   Highest education level: Not on file  Occupational History   Occupation: OFFICE liasion    Employer: [employer]   Occupation: Real Data processing manager   Occupation: OFFICE LEAZON    Employer: [employer]  Tobacco Use   Smoking status: Never   Smokeless tobacco: Never  Vaping Use   Vaping Use: Never used  Substance and Sexual Activity   Alcohol use: No   Drug use:  No   Sexual activity: Yes    Birth control/protection: Post-menopausal  Other Topics Concern   Not on file  Social History Narrative   Vocational School in [place]. Married '90 - marriage is in very good shape '12, No children.  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 Offerred medical services for them if needed (Nov '11)   Patient is married [name]) and lives at home with her husband.   Patient is working Administrator, arts work.   Patient has a high school education.   Patient is right-handed.   Patient does not drink any caffeine.      Social Determinants of Health   Financial Resource Strain: Low Risk  (01/10/2022)   Overall Financial Resource Strain (CARDIA)    Difficulty of Paying Living Expenses: Not hard at all  Food Insecurity: No Food Insecurity (01/10/2022)   Hunger Vital Sign    Worried About Running Out of Food in the Last Year: Never true    Ran Out of Food in the Last Year: Never true  Transportation Needs: No Transportation Needs (01/10/2022)   PRAPARE - Hydrologist (Medical): No    Lack of Transportation (Non-Medical): No  Physical Activity: Sufficiently Active (01/10/2022)   Exercise Vital Sign    Days of Exercise per Week: 7 days    Minutes of Exercise per Session: 60 min  Stress: No Stress Concern Present (01/10/2022)   [place]    Feeling of Stress : Not at all  Social  Connections: [place] (01/10/2022)   Social Connection and Isolation Panel [NHANES]    Frequency of Communication with Friends and Family: More than three times a week    Frequency of Social Gatherings with Friends and Family: Once a week    Attends Religious Services: More than 4 times per year    Active Member of [employer] or Organizations: No    Attends Music therapist: More than 4 times per year    Marital Status: Married  Human resources officer Violence: Not At Risk (01/10/2022)   Humiliation, Afraid, Rape, and Kick questionnaire    Fear of Current or Ex-Partner: No    Emotionally Abused: No    Physically Abused: No    Sexually Abused: No    Family History  Problem Relation Age of Onset   Hyperlipidemia Mother    Hypertension Mother    Hyperlipidemia Father    COPD Father        Smoker, deceased June 24, 2014   Hypertension Father    Hypertension Sister    Hyperlipidemia Sister    Headache Sister    Diabetes Neg Hx    Colon cancer Neg Hx    Breast cancer Neg Hx    Coronary artery disease Neg Hx    Esophageal cancer Neg Hx    Stomach cancer Neg Hx    Pancreatic cancer Neg Hx    Liver disease Neg Hx     Past Medical History:  Diagnosis Date   Allergic rhinitis, cause unspecified    Backache, unspecified    Benign fasciculation-cramp syndrome 12/03/2012    Worsening with  stress, fatigue .   Calculus of kidney    Conversion disorder    Cotton wool spots    Esophageal reflux    Floater, vitreous, left Jun 24, 2016   GERD (gastroesophageal reflux disease)  Hemorrhage of rectum and anus    Internal hemorrhoids without mention of complication    Irritable bowel syndrome    Microscopic hematuria    Migraine headache with aura    Mild aortic insufficiency    Mild tricuspid regurgitation    Osteopenia    Other plastic surgery for unacceptable cosmetic appearance    Inj tx fllers/expander   Personal history of urinary calculi    Postmenopausal atrophic  vaginitis    Premature atrial contractions    PVC (premature ventricular contraction)    a. Event monitor 2013.   Unspecified constipation    Vasovagal syncope     Past Surgical History:  Procedure Laterality Date   BREAST BIOPSY Right    Cosmetic Procedures w/Injection therapy     SKIN CANCER EXCISION Left    thigh   WISDOM TOOTH EXTRACTION      Current Outpatient Medications  Medication Sig Dispense Refill   ALPRAZolam (XANAX) 0.25 MG tablet 0.5-1 po bid prn anxiety 30 tablet 1   Botulinum Toxin Type A, Cosm, 50 units SOLR Inject 4-6 Units into the muscle. 3 month  injection     cephALEXin (KEFLEX) 500 MG capsule Take 1 capsule (500 mg total) by mouth 4 (four) times daily. 28 capsule 0   Cholecalciferol (VITAMIN D3 PO) Take 50 mcg by mouth daily.     Cyanocobalamin (B-12) 1000 MCG SUBL PLACE 1 TABLET (1,000 MCG TOTAL) UNDER THE TONGUE DAILY. 100 tablet 3   DEXILANT 60 MG capsule Take 1 capsule (60 mg total) by mouth daily. 30 capsule 11   EPIPEN 2-PAK 0.3 MG/0.3ML SOAJ injection See admin instructions. Reported on 10/08/2015  1   EUCRISA 2 % OINT Apply topically. Apply 1 to 2 times a day     famotidine (PEPCID) 40 MG tablet Take 1 tablet (40 mg total) by mouth at bedtime. 30 tablet 2   loratadine (CLARITIN) 10 MG tablet TAKE 1 TABLET BY MOUTH EVERY DAY 90 tablet 1   methylPREDNISolone (MEDROL DOSEPAK) 4 MG TBPK tablet As directed 21 tablet 0   mometasone (NASONEX) 50 MCG/ACT nasal spray INSTILL 2 SPRAYS IN EACH NOSTRIL EVERY DAY 51 each 1   Multiple Vitamins-Minerals (ICAPS) TABS Take 1 tablet by mouth 2 (two) times daily.     PREMARIN vaginal cream Use PV as directed 42.5 g 3   timolol (TIMOPTIC) 0.25 % ophthalmic solution SMARTSIG:In Eye(s)     tretinoin (RETIN-A) 0.05 % cream Apply 1 application topically at bedtime.     triamcinolone ointment (KENALOG) 0.1 % Apply topically 2 (two) times daily. 160 g 1   UNABLE TO FIND once a week. Med Name: allergy shots     valACYclovir  (VALTREX) 500 MG tablet Take 1 tablet (500 mg total) by mouth 2 (two) times daily. 10 tablet 2   hyoscyamine (LEVSIN) 0.125 MG tablet Take 1 tablet (0.125 mg total) by mouth every 4 (four) hours as needed for up to 10 days (chest spasms). 100 tablet 3   No current facility-administered medications for this visit.    Allergies as of 04/11/2022 - Review Complete 04/11/2022  Allergen Reaction Noted   Codeine Other (See Comments)    Doxycycline Other (See Comments) 05/31/2016   Epinephrine  08/12/2012   Neomycin  03/08/2015   Penicillins Hives and Other (See Comments)    Protonix [pantoprazole sodium]  06/06/2017   Sulfonamide derivatives Other (See Comments)    Zithromax [azithromycin] Rash 08/18/2014    Vitals: BP 124/73 (BP Location:  Left Arm, Patient Position: Sitting, Cuff Size: Normal)   Pulse 80   Ht 5' 1.5" (1.562 m)   Wt 103 lb 6.4 oz (46.9 kg)   BMI 19.22 kg/m  Last Weight:  Wt Readings from Last 1 Encounters:  04/11/22 103 lb 6.4 oz (46.9 kg)   Last Height:   Ht Readings from Last 1 Encounters:  04/11/22 5' 1.5" (1.562 m)   Vision Screening:  Left eye with correction 25/20.  Right eye with correction 25/20. Monovision, contacts.   Physical exam:  General: The patient is awake, alert and appears not in acute distress. The patient is well groomed. Head: Normocephalic, atraumatic. Neck is supple. Mallampati 1- wide open-  neck circumference 12.5 inches, No retrognathia. TMJ click on the right.  Cardiovascular:  Regular rate and rhythm, without  murmurs or carotid bruit, and without distended neck veins.  Respiratory: Lungs are clear to auscultation. Skin:  Without evidence of edema, or rash Trunk: BMI low normal. 19 kg/m2.   Neurologic exam : The patient is awake and alert, oriented to place and time.   Memory subjective described as intact. There is a normal attention span & concentration ability.  Speech is fluent without  dysarthria, dysphonia or aphasia.  Mood  and affect are appropriate.  Cranial nerves: Pupils are equal and briskly reactive to light. Extraocular movements  in vertical and horizontal planes intact and without nystagmus. Never had nystagmus.  Hearing to finger rub intact.  Facial sensation intact to fine touch. Facial motor strength is symmetric and tongue and uvula move midline. Motor exam: Normal tone and normal muscle bulk and symmetric normal strength in all extremities. No fasciculations.  Sensory:  vibration sensation intact in all extremities.  Coordination: Rapid alternating movements in the fingers/hands is normal.  Finger-to-nose maneuver tested and normal without evidence of ataxia, dysmetria or tremor. Gait and station: Patient walks without assistive device.  Deep tendon reflexes: in the upper and lower extremities are symmetric and intact.   Assessment:  After physical and neurologic examination, review of laboratory studies, imaging, neurophysiology testing and pre-existing records.  Benign fasciculations are rare if not resolved ! She stayed off caffeine.  She has rarely palpitations, since reflux is controlled. Dr. Henrene Pastor is her GI specialist.  She is not drinking alcohol, not eating late.  Her orthodontist noted some TMJ click, she did not have pain.  We will address snoring with a device- she  prefers positional  change first- she has no need for CPAP.  Her HST was negative for sleep apnea.  She had tachycardia twice a month following her Covid vaccination- DR. Camnitz has seen her.  None recently.  Has never had a Covid infection.    Headaches resolved since August 2018 after she stopped eating chocolate covered almonds.  Diplopia horizontal, related to stress situations isolated events- , no longer present since early 2018. Amaurosis fugax episode versus complicated migraine- negative dopplers in 2019.  We assume that this was migraine related.  Dr [name] has been mentioning macular changes , she has started a  vitamin for prevention.  She will address osteoporosis wit Dr [name].  Yearly follow up next in 2024   Larey Seat, MD   CC Dr. Loni Muse. Plotnikov

## 2022-04-16 ENCOUNTER — Ambulatory Visit (INDEPENDENT_AMBULATORY_CARE_PROVIDER_SITE_OTHER): Payer: Medicare Other | Admitting: Family Medicine

## 2022-04-16 VITALS — BP 128/82 | HR 76 | Ht 61.5 in | Wt 102.0 lb

## 2022-04-16 DIAGNOSIS — M810 Age-related osteoporosis without current pathological fracture: Secondary | ICD-10-CM | POA: Insufficient documentation

## 2022-04-16 DIAGNOSIS — S39012A Strain of muscle, fascia and tendon of lower back, initial encounter: Secondary | ICD-10-CM | POA: Diagnosis not present

## 2022-04-16 NOTE — Patient Instructions (Signed)
Thank you for coming in today.   Work on the home exercises.   Use a heating pad.   We will work on [employer].  Let me know.

## 2022-04-16 NOTE — Progress Notes (Signed)
   I, Peterson Lombard, LAT, ATC acting as a scribe for Lynne Leader, MD.  [name] is a 71 y.o. female who presents to [place] at [facility] today for back pain.  Patient was previously seen by Dr. Georgina Snell on 10/30/2021 for right foot pain.  Patient was seen by Dr. Tamala Julian on 08/23/2021 for LBP.  Today, patient reports that she has recently been dx w/ osteoporosis and concerned about her activity level. Pt took at trip to the [place] for a few days and spent a lot of time in the car and has some LBP Thurs-Friday. Patient locates pain to the L-side of her low.  Radiating pain: no LE numbness/tingling: no LE weakness: no Aggravates: rotational movements Treatments tried: IBU  Dx imaging: 08/23/2021 L-spine & L hip x-ray  Pertinent review of systems: No fevers or chills  Relevant historical information: Osteoporosis.  She reports having tried Fosamax in the past and having difficulty tolerating it.   Exam:  BP 128/82   Pulse 76   Ht 5' 1.5" (1.562 m)   Wt 102 lb (46.3 kg)   SpO2 97%   BMI 18.96 kg/m  General: Well Developed, well nourished, and in no acute distress.   MSK: L-spine: Nontender spinal midline.  Mildly tender palpation left lumbar paraspinal musculature. Slight decreased range of motion lumbar spine. Lower extremity strength and reflexes are intact.    Lab and Radiology Results  X-ray scan from [facility] mammography dated February 24, 2022  T score -2.7 at left femoral neck.  This is diagnostic for osteoporosis. This report will be sent to scan.    Assessment and Plan: 72 y.o. female with osteoporosis. After discussion she would like to try Prolia.  She has tried Fosamax in the remote past and had difficulty tolerating it.  She already is maximizing conservative management strategies including weightbearing activities calcium and vitamin D.  Recommend continue resistance training.  We will work on authorization for [employer].  As for her back  pain recommend home exercise program.  If needed physical therapy can be added.  Heating pad should be helpful as well.    Discussed warning signs or symptoms. Please see discharge instructions. Patient expresses understanding.   The above documentation has been reviewed and is accurate and complete Lynne Leader, M.D.

## 2022-04-18 ENCOUNTER — Ambulatory Visit (INDEPENDENT_AMBULATORY_CARE_PROVIDER_SITE_OTHER): Payer: Medicare Other | Admitting: Internal Medicine

## 2022-04-18 ENCOUNTER — Encounter: Payer: Self-pay | Admitting: Internal Medicine

## 2022-04-18 ENCOUNTER — Telehealth: Payer: Self-pay | Admitting: Family Medicine

## 2022-04-18 ENCOUNTER — Ambulatory Visit: Payer: Medicare Other | Admitting: Internal Medicine

## 2022-04-18 VITALS — BP 120/64 | HR 66 | Temp 98.2°F | Ht 61.5 in | Wt 104.4 lb

## 2022-04-18 DIAGNOSIS — N6019 Diffuse cystic mastopathy of unspecified breast: Secondary | ICD-10-CM | POA: Diagnosis not present

## 2022-04-18 DIAGNOSIS — R92343 Mammographic extreme density, bilateral breasts: Secondary | ICD-10-CM

## 2022-04-18 DIAGNOSIS — M810 Age-related osteoporosis without current pathological fracture: Secondary | ICD-10-CM | POA: Diagnosis not present

## 2022-04-18 DIAGNOSIS — N6009 Solitary cyst of unspecified breast: Secondary | ICD-10-CM

## 2022-04-18 DIAGNOSIS — N644 Mastodynia: Secondary | ICD-10-CM | POA: Diagnosis not present

## 2022-04-18 DIAGNOSIS — R002 Palpitations: Secondary | ICD-10-CM | POA: Diagnosis not present

## 2022-04-18 DIAGNOSIS — E538 Deficiency of other specified B group vitamins: Secondary | ICD-10-CM | POA: Diagnosis not present

## 2022-04-18 LAB — COMPREHENSIVE METABOLIC PANEL
ALT: 14 U/L (ref 0–35)
AST: 16 U/L (ref 0–37)
Albumin: 4.4 g/dL (ref 3.5–5.2)
Alkaline Phosphatase: 65 U/L (ref 39–117)
BUN: 21 mg/dL (ref 6–23)
CO2: 31 mEq/L (ref 19–32)
Calcium: 9.7 mg/dL (ref 8.4–10.5)
Chloride: 103 mEq/L (ref 96–112)
Creatinine, Ser: 0.78 mg/dL (ref 0.40–1.20)
GFR: 77.46 mL/min (ref >60.00)
Glucose, Bld: 89 mg/dL (ref 70–99)
Potassium: 4.2 mEq/L (ref 3.5–5.1)
Sodium: 138 mEq/L (ref 135–145)
Total Bilirubin: 0.5 mg/dL (ref 0.2–1.2)
Total Protein: 7.3 g/dL (ref 6.0–8.3)

## 2022-04-18 LAB — VITAMIN B12: Vitamin B-12: 670 pg/mL (ref 211–911)

## 2022-04-18 LAB — VITAMIN D 25 HYDROXY (VIT D DEFICIENCY, FRACTURES): VITD: 46.9 ng/mL (ref 30.00–100.00)

## 2022-04-18 LAB — TSH: TSH: 1.48 u[IU]/mL (ref 0.35–5.50)

## 2022-04-18 MED ORDER — LORATADINE 10 MG PO TABS: 10.0000 mg | ORAL_TABLET | Freq: Every day | ORAL | 3 refills | Status: AC

## 2022-04-18 MED ORDER — CALCITONIN (SALMON) 200 UNIT/ACT NA SOLN
1.0000 | Freq: Every day | NASAL | 11 refills | Status: DC
Start: 2022-04-18 — End: 2022-11-17

## 2022-04-18 MED ORDER — B-12 1000 MCG SL SUBL: SUBLINGUAL_TABLET | SUBLINGUAL | 3 refills | Status: AC

## 2022-04-18 NOTE — Telephone Encounter (Signed)
Dr Georgina Snell recommended that the patient consider Prolia. (Bone Density completed on 02/23/2022. Left Femoral Neck T Score: -2.7.)  Submitted for verification. Covered at 100%  Spoke to patient. She is going to discuss with Dr [name] and then call us back.

## 2022-04-18 NOTE — Assessment & Plan Note (Signed)
Mammo/US at [facility] - April 2024

## 2022-04-18 NOTE — Progress Notes (Signed)
Subjective:  Patient ID: [name], female    DOB: 1950/09/16  Age: 71 y.o. MRN: [id number]  CC: Follow-up (F/U on osteoporosis)   HPI [name] presents for osteopenia, B12 deficiency, fibrocystic breast disease  Outpatient Medications Prior to Visit  Medication Sig Dispense Refill   ALPRAZolam (XANAX) 0.25 MG tablet 0.5-1 po bid prn anxiety 30 tablet 1   Botulinum Toxin Type A, Cosm, 50 units SOLR Inject 4-6 Units into the muscle. 3 month  injection     Cholecalciferol (VITAMIN D3 PO) Take 50 mcg by mouth daily.     DEXILANT 60 MG capsule Take 1 capsule (60 mg total) by mouth daily. 30 capsule 11   EPIPEN 2-PAK 0.3 MG/0.3ML SOAJ injection See admin instructions. Reported on 10/08/2015  1   EUCRISA 2 % OINT Apply topically. Apply 1 to 2 times a day     famotidine (PEPCID) 40 MG tablet Take 1 tablet (40 mg total) by mouth at bedtime. 30 tablet 2   methylPREDNISolone (MEDROL DOSEPAK) 4 MG TBPK tablet As directed 21 tablet 0   mometasone (NASONEX) 50 MCG/ACT nasal spray INSTILL 2 SPRAYS IN EACH NOSTRIL EVERY DAY 51 each 1   Multiple Vitamins-Minerals (ICAPS) TABS Take 1 tablet by mouth 2 (two) times daily.     PREMARIN vaginal cream Use PV as directed 42.5 g 3   timolol (TIMOPTIC) 0.25 % ophthalmic solution SMARTSIG:In Eye(s)     tretinoin (RETIN-A) 0.05 % cream Apply 1 application topically at bedtime.     triamcinolone ointment (KENALOG) 0.1 % Apply topically 2 (two) times daily. 160 g 1   UNABLE TO FIND once a week. Med Name: allergy shots     valACYclovir (VALTREX) 500 MG tablet Take 1 tablet (500 mg total) by mouth 2 (two) times daily. 10 tablet 2   Cyanocobalamin (B-12) 1000 MCG SUBL PLACE 1 TABLET (1,000 MCG TOTAL) UNDER THE TONGUE DAILY. 100 tablet 3   loratadine (CLARITIN) 10 MG tablet TAKE 1 TABLET BY MOUTH EVERY DAY 90 tablet 1   hyoscyamine (LEVSIN) 0.125 MG tablet Take 1 tablet (0.125 mg total) by mouth every 4 (four) hours as needed for up to 10 days (chest  spasms). 100 tablet 3   No facility-administered medications prior to visit.    ROS: Review of Systems  Constitutional:  Negative for activity change, appetite change, chills, fatigue and unexpected weight change.  HENT:  Negative for congestion, mouth sores and sinus pressure.   Eyes:  Negative for visual disturbance.  Respiratory:  Negative for cough and chest tightness.   Gastrointestinal:  Negative for abdominal pain and nausea.  Genitourinary:  Negative for difficulty urinating, frequency and vaginal pain.  Musculoskeletal:  Negative for back pain and gait problem.  Skin:  Negative for pallor and rash.  Neurological:  Negative for dizziness, tremors, weakness, numbness and headaches.  Psychiatric/Behavioral:  Negative for confusion and sleep disturbance.     Objective:  BP 120/64 (BP Location: Left Arm)   Pulse 66   Temp 98.2 F (36.8 C) (Oral)   Ht 5' 1.5" (1.562 m)   Wt 104 lb 6.4 oz (47.4 kg)   SpO2 98%   BMI 19.41 kg/m   BP Readings from Last 3 Encounters:  04/18/22 120/64  04/16/22 128/82  04/11/22 124/73    Wt Readings from Last 3 Encounters:  04/18/22 104 lb 6.4 oz (47.4 kg)  04/16/22 102 lb (46.3 kg)  04/11/22 103 lb 6.4 oz (46.9 kg)    Physical Exam  Constitutional:      General: She is not in acute distress.    Appearance: She is well-developed.  HENT:     Head: Normocephalic.     Right Ear: External ear normal.     Left Ear: External ear normal.     Nose: Nose normal.  Eyes:     General:        Right eye: No discharge.        Left eye: No discharge.     Conjunctiva/sclera: Conjunctivae normal.     Pupils: Pupils are equal, round, and reactive to light.  Neck:     Thyroid: No thyromegaly.     Vascular: No JVD.     Trachea: No tracheal deviation.  Cardiovascular:     Rate and Rhythm: Normal rate and regular rhythm.     Heart sounds: Normal heart sounds.  Pulmonary:     Effort: No respiratory distress.     Breath sounds: No stridor. No  wheezing.  Abdominal:     General: Bowel sounds are normal. There is no distension.     Palpations: Abdomen is soft. There is no mass.     Tenderness: There is no abdominal tenderness. There is no guarding or rebound.  Musculoskeletal:        General: No tenderness.     Cervical back: Normal [name] of motion and neck supple. No rigidity.  Lymphadenopathy:     Cervical: No cervical adenopathy.  Skin:    Findings: No erythema or rash.  Neurological:     Cranial Nerves: No cranial nerve deficit.     Motor: No abnormal muscle tone.     Coordination: Coordination normal.     Deep Tendon Reflexes: Reflexes normal.  Psychiatric:        Behavior: Behavior normal.        Thought Content: Thought content normal.        Judgment: Judgment normal.     Lab Results  Component Value Date   WBC 3.5 (L) 01/23/2022   HGB 12.4 01/23/2022   HCT 36.4 01/23/2022   PLT 179.0 01/23/2022   GLUCOSE 89 04/18/2022   CHOL 206 (H) 01/23/2022   TRIG 62.0 01/23/2022   HDL 64.40 01/23/2022   LDLCALC 129 (H) 01/23/2022   ALT 14 04/18/2022   AST 16 04/18/2022   NA 138 04/18/2022   K 4.2 04/18/2022   CL 103 04/18/2022   CREATININE 0.78 04/18/2022   BUN 21 04/18/2022   CO2 31 04/18/2022   TSH 1.48 04/18/2022    No results found.  Assessment & Plan:   Problem List Items Addressed This Visit     B12 deficiency    On Rx      Relevant Orders   Vitamin B12 (Completed)   Dense breast tissue on mammogram - Primary   Relevant Orders   MM Digital Diagnostic Bilat   Fibrocystic breast changes    Mammo[facility] at [facility] - April 2024      Relevant Orders   MM Digital Diagnostic Bilat   Osteoporosis    [facility]  T score -2.7 left femoral neck. Start Miacalcin qd      Relevant Medications   calcitonin, salmon, (MIACALCIN/FORTICAL) 200 UNIT/ACT nasal spray   Other Relevant Orders   VITAMIN D 25 Hydroxy (Vit-D Deficiency, Fractures) (Completed)   TSH (Completed)   Comprehensive metabolic panel  (Completed)   Palpitations   Relevant Orders   TSH (Completed)   Other Visit Diagnoses  Breast cyst, unspecified laterality       Relevant Orders   MM Digital Diagnostic Bilat   Breast pain       Relevant Orders   MM Digital Diagnostic Bilat   VITAMIN D 25 Hydroxy (Vit-D Deficiency, Fractures) (Completed)   Vitamin B12 (Completed)   TSH (Completed)   Comprehensive metabolic panel (Completed)         Meds ordered this encounter  Medications   calcitonin, salmon, (MIACALCIN/FORTICAL) 200 UNIT/ACT nasal spray    Sig: Place 1 spray into alternate nostrils daily.    Dispense:  3.7 mL    Refill:  11   loratadine (CLARITIN) 10 MG tablet    Sig: Take 1 tablet (10 mg total) by mouth daily.    Dispense:  100 tablet    Refill:  3   Cyanocobalamin (B-12) 1000 MCG SUBL    Sig: PLACE 1 TABLET (1,000 MCG TOTAL) UNDER THE TONGUE DAILY.    Dispense:  100 tablet    Refill:  3      Follow-up: Return in about 6 months (around 10/17/2022) for a follow-up visit.  [name], MD

## 2022-04-18 NOTE — Assessment & Plan Note (Signed)
On Rx 

## 2022-04-18 NOTE — Assessment & Plan Note (Signed)
[facility]  T score -2.7 left femoral neck. Start Miacalcin qd

## 2022-04-20 DIAGNOSIS — J3089 Other allergic rhinitis: Secondary | ICD-10-CM | POA: Diagnosis not present

## 2022-04-20 DIAGNOSIS — J3081 Allergic rhinitis due to animal (cat) (dog) hair and dander: Secondary | ICD-10-CM | POA: Diagnosis not present

## 2022-04-20 DIAGNOSIS — J301 Allergic rhinitis due to pollen: Secondary | ICD-10-CM | POA: Diagnosis not present

## 2022-04-21 ENCOUNTER — Encounter: Payer: Self-pay | Admitting: Internal Medicine

## 2022-04-23 DIAGNOSIS — J3081 Allergic rhinitis due to animal (cat) (dog) hair and dander: Secondary | ICD-10-CM | POA: Diagnosis not present

## 2022-04-23 DIAGNOSIS — J301 Allergic rhinitis due to pollen: Secondary | ICD-10-CM | POA: Diagnosis not present

## 2022-04-23 DIAGNOSIS — J3089 Other allergic rhinitis: Secondary | ICD-10-CM | POA: Diagnosis not present

## 2022-04-26 ENCOUNTER — Ambulatory Visit: Payer: Medicare Other | Admitting: Internal Medicine

## 2022-04-30 DIAGNOSIS — J3081 Allergic rhinitis due to animal (cat) (dog) hair and dander: Secondary | ICD-10-CM | POA: Diagnosis not present

## 2022-04-30 DIAGNOSIS — J3089 Other allergic rhinitis: Secondary | ICD-10-CM | POA: Diagnosis not present

## 2022-04-30 DIAGNOSIS — J301 Allergic rhinitis due to pollen: Secondary | ICD-10-CM | POA: Diagnosis not present

## 2022-05-07 DIAGNOSIS — J3081 Allergic rhinitis due to animal (cat) (dog) hair and dander: Secondary | ICD-10-CM | POA: Diagnosis not present

## 2022-05-07 DIAGNOSIS — J3089 Other allergic rhinitis: Secondary | ICD-10-CM | POA: Diagnosis not present

## 2022-05-07 DIAGNOSIS — J301 Allergic rhinitis due to pollen: Secondary | ICD-10-CM | POA: Diagnosis not present

## 2022-05-15 DIAGNOSIS — J301 Allergic rhinitis due to pollen: Secondary | ICD-10-CM | POA: Diagnosis not present

## 2022-05-15 DIAGNOSIS — J3089 Other allergic rhinitis: Secondary | ICD-10-CM | POA: Diagnosis not present

## 2022-05-15 DIAGNOSIS — J3081 Allergic rhinitis due to animal (cat) (dog) hair and dander: Secondary | ICD-10-CM | POA: Diagnosis not present

## 2022-05-23 DIAGNOSIS — J3081 Allergic rhinitis due to animal (cat) (dog) hair and dander: Secondary | ICD-10-CM | POA: Diagnosis not present

## 2022-05-23 DIAGNOSIS — J301 Allergic rhinitis due to pollen: Secondary | ICD-10-CM | POA: Diagnosis not present

## 2022-05-23 DIAGNOSIS — J3089 Other allergic rhinitis: Secondary | ICD-10-CM | POA: Diagnosis not present

## 2022-05-24 DIAGNOSIS — H6123 Impacted cerumen, bilateral: Secondary | ICD-10-CM | POA: Diagnosis not present

## 2022-06-01 DIAGNOSIS — J301 Allergic rhinitis due to pollen: Secondary | ICD-10-CM | POA: Diagnosis not present

## 2022-06-01 DIAGNOSIS — J3081 Allergic rhinitis due to animal (cat) (dog) hair and dander: Secondary | ICD-10-CM | POA: Diagnosis not present

## 2022-06-01 DIAGNOSIS — J3089 Other allergic rhinitis: Secondary | ICD-10-CM | POA: Diagnosis not present

## 2022-06-05 DIAGNOSIS — J3081 Allergic rhinitis due to animal (cat) (dog) hair and dander: Secondary | ICD-10-CM | POA: Diagnosis not present

## 2022-06-05 DIAGNOSIS — J301 Allergic rhinitis due to pollen: Secondary | ICD-10-CM | POA: Diagnosis not present

## 2022-06-05 DIAGNOSIS — J3089 Other allergic rhinitis: Secondary | ICD-10-CM | POA: Diagnosis not present

## 2022-06-14 DIAGNOSIS — J3089 Other allergic rhinitis: Secondary | ICD-10-CM | POA: Diagnosis not present

## 2022-06-14 DIAGNOSIS — J301 Allergic rhinitis due to pollen: Secondary | ICD-10-CM | POA: Diagnosis not present

## 2022-06-14 DIAGNOSIS — J3081 Allergic rhinitis due to animal (cat) (dog) hair and dander: Secondary | ICD-10-CM | POA: Diagnosis not present

## 2022-06-19 ENCOUNTER — Telehealth: Payer: Self-pay | Admitting: Internal Medicine

## 2022-06-19 ENCOUNTER — Other Ambulatory Visit (INDEPENDENT_AMBULATORY_CARE_PROVIDER_SITE_OTHER): Payer: Medicare Other

## 2022-06-19 DIAGNOSIS — N390 Urinary tract infection, site not specified: Secondary | ICD-10-CM

## 2022-06-19 DIAGNOSIS — R300 Dysuria: Secondary | ICD-10-CM | POA: Diagnosis not present

## 2022-06-19 LAB — URINALYSIS, ROUTINE W REFLEX MICROSCOPIC
Bilirubin Urine: NEGATIVE
Hgb urine dipstick: NEGATIVE
Ketones, ur: NEGATIVE
Leukocytes,Ua: NEGATIVE
Nitrite: NEGATIVE
RBC / HPF: NONE SEEN (ref 0–?)
Specific Gravity, Urine: 1.015 (ref 1.000–1.030)
Total Protein, Urine: NEGATIVE
Urine Glucose: NEGATIVE
Urobilinogen, UA: 0.2 (ref 0.0–1.0)
WBC, UA: NONE SEEN (ref 0–?)
pH: 7.5 (ref 5.0–8.0)

## 2022-06-19 NOTE — Telephone Encounter (Signed)
Patient has appointment on Friday, 1/5 and she is asking for lab order for urinalysis be placed now so she can go ahead and give a sample - states she is experiencing UTI sx and she would like to have urine results before her appt on Friday. Call back number is [phone].

## 2022-06-19 NOTE — Telephone Encounter (Signed)
Called pt back inform place order for UA. Have been added to lab schedule.Marland KitchenJohny [name]

## 2022-06-22 ENCOUNTER — Ambulatory Visit: Payer: Medicare Other | Admitting: Internal Medicine

## 2022-06-22 DIAGNOSIS — J301 Allergic rhinitis due to pollen: Secondary | ICD-10-CM | POA: Diagnosis not present

## 2022-06-22 DIAGNOSIS — J3089 Other allergic rhinitis: Secondary | ICD-10-CM | POA: Diagnosis not present

## 2022-06-22 DIAGNOSIS — J3081 Allergic rhinitis due to animal (cat) (dog) hair and dander: Secondary | ICD-10-CM | POA: Diagnosis not present

## 2022-06-24 ENCOUNTER — Encounter: Payer: Self-pay | Admitting: Internal Medicine

## 2022-06-25 DIAGNOSIS — J301 Allergic rhinitis due to pollen: Secondary | ICD-10-CM | POA: Diagnosis not present

## 2022-06-25 DIAGNOSIS — J3089 Other allergic rhinitis: Secondary | ICD-10-CM | POA: Diagnosis not present

## 2022-06-25 DIAGNOSIS — J3081 Allergic rhinitis due to animal (cat) (dog) hair and dander: Secondary | ICD-10-CM | POA: Diagnosis not present

## 2022-06-29 DIAGNOSIS — H04123 Dry eye syndrome of bilateral lacrimal glands: Secondary | ICD-10-CM | POA: Diagnosis not present

## 2022-07-04 DIAGNOSIS — J3089 Other allergic rhinitis: Secondary | ICD-10-CM | POA: Diagnosis not present

## 2022-07-04 DIAGNOSIS — J3081 Allergic rhinitis due to animal (cat) (dog) hair and dander: Secondary | ICD-10-CM | POA: Diagnosis not present

## 2022-07-04 DIAGNOSIS — J301 Allergic rhinitis due to pollen: Secondary | ICD-10-CM | POA: Diagnosis not present

## 2022-07-11 ENCOUNTER — Ambulatory Visit (INDEPENDENT_AMBULATORY_CARE_PROVIDER_SITE_OTHER): Payer: Medicare Other | Admitting: Podiatry

## 2022-07-11 ENCOUNTER — Encounter: Payer: Self-pay | Admitting: Podiatry

## 2022-07-11 DIAGNOSIS — L820 Inflamed seborrheic keratosis: Secondary | ICD-10-CM | POA: Diagnosis not present

## 2022-07-11 DIAGNOSIS — Q828 Other specified congenital malformations of skin: Secondary | ICD-10-CM

## 2022-07-11 DIAGNOSIS — L309 Dermatitis, unspecified: Secondary | ICD-10-CM | POA: Diagnosis not present

## 2022-07-11 NOTE — Progress Notes (Signed)
Subjective:   Patient ID: [name], female   DOB: 72 y.o.   MRN: [id number]   HPI Patient presents stating she has lesions on both feet that get painful   ROS      Objective:  Physical Exam  Neurovascular status intact keratotic lesion with lucent cores bilateral plantar feet     Assessment:  Chronic porokeratotic lesions bilateral     Plan:  Debrided painful lesions no angiogenic bleeding reappoint routine care

## 2022-07-12 ENCOUNTER — Encounter: Payer: Self-pay | Admitting: Cardiovascular Disease

## 2022-07-12 DIAGNOSIS — J3089 Other allergic rhinitis: Secondary | ICD-10-CM | POA: Diagnosis not present

## 2022-07-12 DIAGNOSIS — J301 Allergic rhinitis due to pollen: Secondary | ICD-10-CM | POA: Diagnosis not present

## 2022-07-12 DIAGNOSIS — J3081 Allergic rhinitis due to animal (cat) (dog) hair and dander: Secondary | ICD-10-CM | POA: Diagnosis not present

## 2022-07-16 ENCOUNTER — Encounter: Payer: Self-pay | Admitting: Cardiovascular Disease

## 2022-07-16 ENCOUNTER — Ambulatory Visit: Payer: Medicare Other | Attending: Cardiovascular Disease | Admitting: Cardiovascular Disease

## 2022-07-16 VITALS — BP 138/72 | HR 76 | Ht 61.5 in | Wt 104.8 lb

## 2022-07-16 DIAGNOSIS — I351 Nonrheumatic aortic (valve) insufficiency: Secondary | ICD-10-CM

## 2022-07-16 DIAGNOSIS — I471 Supraventricular tachycardia, unspecified: Secondary | ICD-10-CM | POA: Diagnosis not present

## 2022-07-16 NOTE — Patient Instructions (Addendum)
Medication Instructions:  No changes *If you need a refill on your cardiac medications before your next appointment, please call your pharmacy*   Lab Work: none If you have labs (blood work) drawn today and your tests are completely normal, you will receive your results only by: [place] (if you have MyChart) OR A paper copy in the mail If you have any lab test that is abnormal or we need to change your treatment, we will call you to review the results.   Testing/Procedures: none   Follow-Up: At [facility], you and your health needs are our priority.  As part of our continuing mission to provide you with exceptional heart care, we have created designated Provider Care Teams.  These Care Teams include your primary Cardiologist (physician) and Advanced Practice Providers (APPs -  Physician Assistants and Nurse Practitioners) who all work together to provide you with the care you need, when you need it.   Your next appointment:   12 month(s)  Provider:   Lauree Chandler, MD

## 2022-07-16 NOTE — Progress Notes (Signed)
Chief Complaint  Patient presents with   Follow-up    SVT, aortic insufficiency    History of Present Illness: 72 yo female with history of SVT, premature atrial contractions, palpitations, vasovagal syncope, mild aortic valve insufficiency, mild mitral regurgitation, irritable bowel syndrome and GERD who is here today for cardiac follow up. She has had palpitations for years. Cardiac monitor April 2020 showed a short run of SVT. Exercise stress test 05/07/14 with good exercise tolerance, no ischemia. Echo 08/25/18 with LVEF=55-60%, mild AI, unchanged. 30 day event monitor March 2020 with sinus rhythm with PACs and PVCs. She wished to wear a second 30 day monitor in April 2020 and this showed an episode of SVT.  She had several episodes of SVT over the summer with an episode in June 2021 and July 2021, both lasting up to 30 minutes. She has a Doctor, hospital for her phone and has two episodes of tachycardia in August and October 2021. Her reading from the company was atrial fibrillation in October but the strips looked like SVT to me. She has been seen by Dr. Lennie Odor in the EP clinic, last in November 2022, and has had Diltiazem and flecainide as needed. She has never take a dose of either of these. Echo March 2022 with LVEF=55-60%, mild aortic insufficiency. Cardiac calcium score arranged in primary care in May 2022 and was zero.   She is here today for follow up. The patient denies any chest pain, dyspnea, palpitations, lower extremity edema, orthopnea, PND, dizziness, near syncope or syncope.   Primary Care Physician: Cassandria Anger, MD  Past Medical History:  Diagnosis Date   Allergic rhinitis, cause unspecified    Backache, unspecified    Benign fasciculation-cramp syndrome 12/03/2012    Worsening with  stress, fatigue .   Calculus of kidney    Conversion disorder    Cotton wool spots    Esophageal reflux    Floater, vitreous, left 05/2016   GERD (gastroesophageal reflux  disease)    Hemorrhage of rectum and anus    Internal hemorrhoids without mention of complication    Irritable bowel syndrome    Microscopic hematuria    Migraine headache with aura    Mild aortic insufficiency    Mild tricuspid regurgitation    Osteopenia    Other plastic surgery for unacceptable cosmetic appearance    Inj tx fllers/expander   Personal history of urinary calculi    Postmenopausal atrophic vaginitis    Premature atrial contractions    PVC (premature ventricular contraction)    a. Event monitor 2013.   Unspecified constipation    Vasovagal syncope     Past Surgical History:  Procedure Laterality Date   BREAST BIOPSY Right    Cosmetic Procedures w/Injection therapy     SKIN CANCER EXCISION Left    thigh   WISDOM TOOTH EXTRACTION      Current Outpatient Medications  Medication Sig Dispense Refill   ALPRAZolam (XANAX) 0.25 MG tablet 0.5-1 po bid prn anxiety 30 tablet 1   Botulinum Toxin Type A, Cosm, 50 units SOLR Inject 4-6 Units into the muscle. 3 month  injection     calcitonin, salmon, (MIACALCIN/FORTICAL) 200 UNIT/ACT nasal spray Place 1 spray into alternate nostrils daily. 3.7 mL 11   Cholecalciferol (VITAMIN D3 PO) Take 50 mcg by mouth daily.     Cyanocobalamin (B-12) 1000 MCG SUBL PLACE 1 TABLET (1,000 MCG TOTAL) UNDER THE TONGUE DAILY. 100 tablet 3   DEXILANT 60 MG  capsule Take 1 capsule (60 mg total) by mouth daily. 30 capsule 11   EPIPEN 2-PAK 0.3 MG/0.3ML SOAJ injection See admin instructions. Reported on 10/08/2015  1   EUCRISA 2 % OINT Apply topically. Apply 1 to 2 times a day     famotidine (PEPCID) 40 MG tablet Take 1 tablet (40 mg total) by mouth at bedtime. 30 tablet 2   loratadine (CLARITIN) 10 MG tablet Take 1 tablet (10 mg total) by mouth daily. 100 tablet 3   methylPREDNISolone (MEDROL DOSEPAK) 4 MG TBPK tablet As directed 21 tablet 0   mometasone (NASONEX) 50 MCG/ACT nasal spray INSTILL 2 SPRAYS IN EACH NOSTRIL EVERY DAY 51 each 1    Multiple Vitamins-Minerals (ICAPS) TABS Take 1 tablet by mouth 2 (two) times daily.     PREMARIN vaginal cream Use PV as directed 42.5 g 3   timolol (TIMOPTIC) 0.25 % ophthalmic solution SMARTSIG:In Eye(s)     tretinoin (RETIN-A) 0.05 % cream Apply 1 application topically at bedtime.     triamcinolone ointment (KENALOG) 0.1 % Apply topically 2 (two) times daily. 160 g 1   UNABLE TO FIND once a week. Med Name: allergy shots     valACYclovir (VALTREX) 500 MG tablet Take 1 tablet (500 mg total) by mouth 2 (two) times daily. 10 tablet 2   hyoscyamine (LEVSIN) 0.125 MG tablet Take 1 tablet (0.125 mg total) by mouth every 4 (four) hours as needed for up to 10 days (chest spasms). 100 tablet 3   No current facility-administered medications for this visit.    Allergies  Allergen Reactions   Codeine Other (See Comments)    Doesn't remeber   Doxycycline Other (See Comments)   Epinephrine     Heart racing    Neomycin     Topical rash from cream   Penicillins Hives and Other (See Comments)    Has patient had a PCN reaction causing immediate rash, facial/tongue/throat swelling, SOB or lightheadedness with hypotension: Yes Has patient had a PCN reaction causing severe rash involving mucus membranes or skin necrosis: Yes Has patient had a PCN reaction that required hospitalization No Has patient had a PCN reaction occurring within the last 10 years: No If all of the above answers are "NO", then may proceed with Cephalosporin use.    Protonix [Pantoprazole Sodium]     ?HAs   Sulfonamide Derivatives Other (See Comments)    Doesn't remember    Zithromax [Azithromycin] Rash    Social History   Socioeconomic History   Marital status: Married    Spouse name: [name]   Number of children: 0   Years of education: 14   Highest education level: Not on file  Occupational History   Occupation: OFFICE liasion    Employer: [employer]   Occupation: Real Data processing manager   Occupation: OFFICE  [employer]    Employer: [employer]  Tobacco Use   Smoking status: Never   Smokeless tobacco: Never  Vaping Use   Vaping Use: Never used  Substance and Sexual Activity   Alcohol use: No   Drug use: No   Sexual activity: Yes    Birth control/protection: Post-menopausal  Other Topics Concern   Not on file  Social History Narrative   Vocational School in [place]. Married '90 - marriage is in very good shape '12, No children.  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 Offerred medical  services for them if needed (Nov '11)   Patient is married [name]) and lives at home with her husband.   Patient is working Administrator, arts work.   Patient has a high school education.   Patient is right-handed.   Patient does not drink any caffeine.      Social Determinants of Health   Financial Resource Strain: Low Risk  (01/10/2022)   Overall Financial Resource Strain (CARDIA)    Difficulty of Paying Living Expenses: Not hard at all  Food Insecurity: No Food Insecurity (01/10/2022)   Hunger Vital Sign    Worried About Running Out of Food in the Last Year: Never true    Ran Out of Food in the Last Year: Never true  Transportation Needs: No Transportation Needs (01/10/2022)   PRAPARE - Hydrologist (Medical): No    Lack of Transportation (Non-Medical): No  Physical Activity: Sufficiently Active (01/10/2022)   Exercise Vital Sign    Days of Exercise per Week: 7 days    Minutes of Exercise per Session: 60 min  Stress: No Stress Concern Present (01/10/2022)   [place]    Feeling of Stress : Not at all  Social Connections: [place] (01/10/2022)   Social Connection and Isolation Panel [NHANES]    Frequency of Communication with Friends and Family: More than three times a week    Frequency of Social Gatherings  with Friends and Family: Once a week    Attends Religious Services: More than 4 times per year    Active Member of [employer] or Organizations: No    Attends Music therapist: More than 4 times per year    Marital Status: Married  Human resources officer Violence: Not At Risk (01/10/2022)   Humiliation, Afraid, Rape, and Kick questionnaire    Fear of Current or Ex-Partner: No    Emotionally Abused: No    Physically Abused: No    Sexually Abused: No    Family History  Problem Relation Age of Onset   Hyperlipidemia Mother    Hypertension Mother    Hyperlipidemia Father    COPD Father        Smoker, deceased 2014/06/25   Hypertension Father    Hypertension Sister    Hyperlipidemia Sister    Headache Sister    Diabetes Neg Hx    Colon cancer Neg Hx    Breast cancer Neg Hx    Coronary artery disease Neg Hx    Esophageal cancer Neg Hx    Stomach cancer Neg Hx    Pancreatic cancer Neg Hx    Liver disease Neg Hx     Review of Systems:  As stated in the HPI and otherwise negative.   BP 138/72   Pulse 76   Ht 5' 1.5" (1.562 m)   Wt 47.5 kg   SpO2 98%   BMI 19.48 kg/m   Physical Examination: General: Well developed, well nourished, NAD  HEENT: OP clear, mucus membranes moist  SKIN: warm, dry. No rashes. Neuro: No focal deficits  Musculoskeletal: Muscle strength 5/5 all ext  Psychiatric: Mood and affect normal  Neck: No JVD, no carotid bruits, no thyromegaly, no lymphadenopathy.  Lungs:Clear bilaterally, no wheezes, rhonci, crackles Cardiovascular: Regular rate and rhythm. No murmurs, gallops or rubs. Abdomen:Soft. Bowel sounds present. Non-tender.  Extremities: No lower extremity edema. Pulses are 2 + in the bilateral DP/PT.  Echo March 2022:  1. Left ventricular ejection fraction,  by estimation, is 55 to 60%. The  left ventricle has normal function. The left ventricle has no regional  wall motion abnormalities. Left ventricular diastolic parameters are  consistent with  Grade I diastolic  dysfunction (impaired relaxation).   2. Right ventricular systolic function is normal. The right ventricular  size is normal. There is normal pulmonary artery systolic pressure. The  estimated right ventricular systolic pressure is 99.2 mmHg.   3. The mitral valve is grossly normal. No evidence of mitral valve  regurgitation. No evidence of mitral stenosis.   4. The aortic valve is tricuspid. There is mild calcification of the  aortic valve. Aortic valve regurgitation is mild. Mild aortic valve  sclerosis is present, with no evidence of aortic valve stenosis.   5. The inferior vena cava is normal in size with greater than 50%  respiratory variability, suggesting right atrial pressure of 3 mmHg.   EKG:  EKG is ordered today. The ekg ordered today demonstrates NSR  Recent Labs: 01/23/2022: Hemoglobin 12.4; Platelets 179.0 04/18/2022: ALT 14; BUN 21; Creatinine, Ser 0.78; Potassium 4.2; Sodium 138; TSH 1.48   Lipid Panel    Component Value Date/Time   CHOL 206 (H) 01/23/2022 0907   TRIG 62.0 01/23/2022 0907   TRIG 66 06/25/2006 0741   HDL 64.40 01/23/2022 0907   CHOLHDL 3 01/23/2022 0907   VLDL 12.4 01/23/2022 0907   LDLCALC 129 (H) 01/23/2022 0907   LDLCALC 115 (H) 01/11/2020 0800     Wt Readings from Last 3 Encounters:  07/16/22 47.5 kg  04/18/22 47.4 kg  04/16/22 46.3 kg    Assessment and Plan:   1. Palpitations/PACs/PVCs/SVT:  She has had no recent palpitations. Will use Diltiazem 30 mg as needed.     2. Aortic valve insufficiency: Mild by echo March 2022. Will repeat echo in March 2025.   3. Carotid artery disease: Mild disease by dopplers August 2019.   Labs/ tests ordered today include:   Orders Placed This Encounter  Procedures   EKG 12-Lead   Disposition:   F/U with me in 12  months  Signed, Lauree Chandler, MD 07/16/2022 3:50 PM    [facility] [address], [place], [place]  [address] Phone: [phone];  Fax: [phone]

## 2022-07-18 DIAGNOSIS — J3089 Other allergic rhinitis: Secondary | ICD-10-CM | POA: Diagnosis not present

## 2022-07-19 DIAGNOSIS — L249 Irritant contact dermatitis, unspecified cause: Secondary | ICD-10-CM | POA: Diagnosis not present

## 2022-07-19 DIAGNOSIS — L820 Inflamed seborrheic keratosis: Secondary | ICD-10-CM | POA: Diagnosis not present

## 2022-07-25 ENCOUNTER — Encounter: Payer: Self-pay | Admitting: Podiatry

## 2022-07-25 DIAGNOSIS — J3081 Allergic rhinitis due to animal (cat) (dog) hair and dander: Secondary | ICD-10-CM | POA: Diagnosis not present

## 2022-07-25 DIAGNOSIS — J3089 Other allergic rhinitis: Secondary | ICD-10-CM | POA: Diagnosis not present

## 2022-07-25 DIAGNOSIS — J301 Allergic rhinitis due to pollen: Secondary | ICD-10-CM | POA: Diagnosis not present

## 2022-07-26 ENCOUNTER — Telehealth: Payer: Self-pay | Admitting: Podiatry

## 2022-07-26 NOTE — Telephone Encounter (Signed)
She would probably have to come back in

## 2022-07-26 NOTE — Telephone Encounter (Signed)
Pt still having tenderness on left and she is asking if there is a cream or something prescription that she could get the help with the problem. She was just in on 1.24 and right foot is good.  She did send a my chart message as well yesterday. Please advise and if needed she stated she could just come back in to have left foot redone a little more aggressively.

## 2022-07-29 ENCOUNTER — Other Ambulatory Visit: Payer: Self-pay | Admitting: Cardiology

## 2022-07-30 DIAGNOSIS — J3081 Allergic rhinitis due to animal (cat) (dog) hair and dander: Secondary | ICD-10-CM | POA: Diagnosis not present

## 2022-07-30 DIAGNOSIS — J3089 Other allergic rhinitis: Secondary | ICD-10-CM | POA: Diagnosis not present

## 2022-07-30 DIAGNOSIS — J301 Allergic rhinitis due to pollen: Secondary | ICD-10-CM | POA: Diagnosis not present

## 2022-08-01 ENCOUNTER — Ambulatory Visit: Payer: Medicare Other | Admitting: Podiatry

## 2022-08-02 ENCOUNTER — Ambulatory Visit (INDEPENDENT_AMBULATORY_CARE_PROVIDER_SITE_OTHER): Payer: Medicare Other | Admitting: Podiatry

## 2022-08-02 DIAGNOSIS — M7752 Other enthesopathy of left foot: Secondary | ICD-10-CM

## 2022-08-02 NOTE — Progress Notes (Signed)
Subjective:   Patient ID: [name], female   DOB: 72 y.o.   MRN: [id number]   HPI Patient presents stating the bottom of the left foot has remained inflamed over the last few weeks   ROS      Objective:  Physical Exam  Neurovascular status intact inflammation of the lesser MPJs left with localized fluid buildup     Assessment:  More of an inflammatory condition versus pure lesions     Plan:  Advised on rigid bottom shoes soaks and sterile debridement was done to the best we can do this but that is only can to provide so much relief.  Patient will be seen back may ultimately require other treatment but I am hoping this will make a difference for her

## 2022-08-10 DIAGNOSIS — J3081 Allergic rhinitis due to animal (cat) (dog) hair and dander: Secondary | ICD-10-CM | POA: Diagnosis not present

## 2022-08-10 DIAGNOSIS — J3089 Other allergic rhinitis: Secondary | ICD-10-CM | POA: Diagnosis not present

## 2022-08-10 DIAGNOSIS — J301 Allergic rhinitis due to pollen: Secondary | ICD-10-CM | POA: Diagnosis not present

## 2022-08-13 DIAGNOSIS — J3081 Allergic rhinitis due to animal (cat) (dog) hair and dander: Secondary | ICD-10-CM | POA: Diagnosis not present

## 2022-08-13 DIAGNOSIS — J3089 Other allergic rhinitis: Secondary | ICD-10-CM | POA: Diagnosis not present

## 2022-08-13 DIAGNOSIS — J301 Allergic rhinitis due to pollen: Secondary | ICD-10-CM | POA: Diagnosis not present

## 2022-08-21 ENCOUNTER — Other Ambulatory Visit: Payer: Self-pay | Admitting: Physician Assistant

## 2022-08-21 DIAGNOSIS — J3089 Other allergic rhinitis: Secondary | ICD-10-CM | POA: Diagnosis not present

## 2022-08-21 DIAGNOSIS — J301 Allergic rhinitis due to pollen: Secondary | ICD-10-CM | POA: Diagnosis not present

## 2022-08-21 DIAGNOSIS — J3081 Allergic rhinitis due to animal (cat) (dog) hair and dander: Secondary | ICD-10-CM | POA: Diagnosis not present

## 2022-08-27 ENCOUNTER — Encounter: Payer: Self-pay | Admitting: Internal Medicine

## 2022-08-27 DIAGNOSIS — J3089 Other allergic rhinitis: Secondary | ICD-10-CM | POA: Diagnosis not present

## 2022-08-27 DIAGNOSIS — J3081 Allergic rhinitis due to animal (cat) (dog) hair and dander: Secondary | ICD-10-CM | POA: Diagnosis not present

## 2022-08-27 DIAGNOSIS — J301 Allergic rhinitis due to pollen: Secondary | ICD-10-CM | POA: Diagnosis not present

## 2022-08-28 ENCOUNTER — Ambulatory Visit: Payer: Medicare Other | Admitting: Internal Medicine

## 2022-08-28 MED ORDER — VALACYCLOVIR HCL 500 MG PO TABS: 500.0000 mg | ORAL_TABLET | Freq: Two times a day (BID) | ORAL | 2 refills | Status: DC

## 2022-08-29 ENCOUNTER — Ambulatory Visit: Payer: Medicare Other | Admitting: Internal Medicine

## 2022-08-31 ENCOUNTER — Encounter: Payer: Self-pay | Admitting: Internal Medicine

## 2022-09-04 DIAGNOSIS — J301 Allergic rhinitis due to pollen: Secondary | ICD-10-CM | POA: Diagnosis not present

## 2022-09-04 DIAGNOSIS — J3081 Allergic rhinitis due to animal (cat) (dog) hair and dander: Secondary | ICD-10-CM | POA: Diagnosis not present

## 2022-09-04 DIAGNOSIS — J3089 Other allergic rhinitis: Secondary | ICD-10-CM | POA: Diagnosis not present

## 2022-09-10 DIAGNOSIS — J301 Allergic rhinitis due to pollen: Secondary | ICD-10-CM | POA: Diagnosis not present

## 2022-09-10 DIAGNOSIS — J3089 Other allergic rhinitis: Secondary | ICD-10-CM | POA: Diagnosis not present

## 2022-09-10 DIAGNOSIS — J3081 Allergic rhinitis due to animal (cat) (dog) hair and dander: Secondary | ICD-10-CM | POA: Diagnosis not present

## 2022-09-11 ENCOUNTER — Ambulatory Visit: Payer: Medicare Other | Admitting: Internal Medicine

## 2022-09-18 DIAGNOSIS — H43812 Vitreous degeneration, left eye: Secondary | ICD-10-CM | POA: Diagnosis not present

## 2022-09-18 DIAGNOSIS — H04123 Dry eye syndrome of bilateral lacrimal glands: Secondary | ICD-10-CM | POA: Diagnosis not present

## 2022-09-18 DIAGNOSIS — H43393 Other vitreous opacities, bilateral: Secondary | ICD-10-CM | POA: Diagnosis not present

## 2022-09-18 DIAGNOSIS — H35361 Drusen (degenerative) of macula, right eye: Secondary | ICD-10-CM | POA: Diagnosis not present

## 2022-09-18 DIAGNOSIS — H2513 Age-related nuclear cataract, bilateral: Secondary | ICD-10-CM | POA: Diagnosis not present

## 2022-09-18 DIAGNOSIS — H11442 Conjunctival cysts, left eye: Secondary | ICD-10-CM | POA: Diagnosis not present

## 2022-09-18 DIAGNOSIS — H16143 Punctate keratitis, bilateral: Secondary | ICD-10-CM | POA: Diagnosis not present

## 2022-09-18 DIAGNOSIS — H43811 Vitreous degeneration, right eye: Secondary | ICD-10-CM | POA: Diagnosis not present

## 2022-09-18 DIAGNOSIS — H0012 Chalazion right lower eyelid: Secondary | ICD-10-CM | POA: Diagnosis not present

## 2022-09-19 DIAGNOSIS — J3089 Other allergic rhinitis: Secondary | ICD-10-CM | POA: Diagnosis not present

## 2022-09-19 DIAGNOSIS — J301 Allergic rhinitis due to pollen: Secondary | ICD-10-CM | POA: Diagnosis not present

## 2022-09-19 DIAGNOSIS — J3081 Allergic rhinitis due to animal (cat) (dog) hair and dander: Secondary | ICD-10-CM | POA: Diagnosis not present

## 2022-09-20 DIAGNOSIS — H0012 Chalazion right lower eyelid: Secondary | ICD-10-CM | POA: Diagnosis not present

## 2022-09-21 DIAGNOSIS — H00011 Hordeolum externum right upper eyelid: Secondary | ICD-10-CM | POA: Diagnosis not present

## 2022-09-24 DIAGNOSIS — J3081 Allergic rhinitis due to animal (cat) (dog) hair and dander: Secondary | ICD-10-CM | POA: Diagnosis not present

## 2022-09-24 DIAGNOSIS — J301 Allergic rhinitis due to pollen: Secondary | ICD-10-CM | POA: Diagnosis not present

## 2022-09-24 DIAGNOSIS — J3089 Other allergic rhinitis: Secondary | ICD-10-CM | POA: Diagnosis not present

## 2022-09-26 NOTE — Telephone Encounter (Signed)
Patient called to say [facility] still has not received the order for patient's imaging. She said they are faxing the paperwork needed over today for Dr. Posey Rea. Their fax number will be on the paperwork. Patient has an appointment scheduled with them tomorrow 09/27/22 at 9:30.

## 2022-09-27 DIAGNOSIS — N6489 Other specified disorders of breast: Secondary | ICD-10-CM | POA: Diagnosis not present

## 2022-10-01 DIAGNOSIS — H16143 Punctate keratitis, bilateral: Secondary | ICD-10-CM | POA: Diagnosis not present

## 2022-10-01 DIAGNOSIS — H1045 Other chronic allergic conjunctivitis: Secondary | ICD-10-CM | POA: Diagnosis not present

## 2022-10-01 DIAGNOSIS — J3089 Other allergic rhinitis: Secondary | ICD-10-CM | POA: Diagnosis not present

## 2022-10-01 DIAGNOSIS — J3081 Allergic rhinitis due to animal (cat) (dog) hair and dander: Secondary | ICD-10-CM | POA: Diagnosis not present

## 2022-10-01 DIAGNOSIS — H2513 Age-related nuclear cataract, bilateral: Secondary | ICD-10-CM | POA: Diagnosis not present

## 2022-10-01 DIAGNOSIS — H43811 Vitreous degeneration, right eye: Secondary | ICD-10-CM | POA: Diagnosis not present

## 2022-10-01 DIAGNOSIS — H43812 Vitreous degeneration, left eye: Secondary | ICD-10-CM | POA: Diagnosis not present

## 2022-10-01 DIAGNOSIS — H35361 Drusen (degenerative) of macula, right eye: Secondary | ICD-10-CM | POA: Diagnosis not present

## 2022-10-01 DIAGNOSIS — H04123 Dry eye syndrome of bilateral lacrimal glands: Secondary | ICD-10-CM | POA: Diagnosis not present

## 2022-10-01 DIAGNOSIS — H11122 Conjunctival concretions, left eye: Secondary | ICD-10-CM | POA: Diagnosis not present

## 2022-10-01 DIAGNOSIS — J301 Allergic rhinitis due to pollen: Secondary | ICD-10-CM | POA: Diagnosis not present

## 2022-10-01 DIAGNOSIS — H11442 Conjunctival cysts, left eye: Secondary | ICD-10-CM | POA: Diagnosis not present

## 2022-10-01 DIAGNOSIS — H43393 Other vitreous opacities, bilateral: Secondary | ICD-10-CM | POA: Diagnosis not present

## 2022-10-01 DIAGNOSIS — H0012 Chalazion right lower eyelid: Secondary | ICD-10-CM | POA: Diagnosis not present

## 2022-10-01 DIAGNOSIS — G43B0 Ophthalmoplegic migraine, not intractable: Secondary | ICD-10-CM | POA: Diagnosis not present

## 2022-10-01 DIAGNOSIS — R21 Rash and other nonspecific skin eruption: Secondary | ICD-10-CM | POA: Diagnosis not present

## 2022-10-04 ENCOUNTER — Telehealth: Payer: Self-pay

## 2022-10-08 DIAGNOSIS — J3081 Allergic rhinitis due to animal (cat) (dog) hair and dander: Secondary | ICD-10-CM | POA: Diagnosis not present

## 2022-10-08 DIAGNOSIS — J301 Allergic rhinitis due to pollen: Secondary | ICD-10-CM | POA: Diagnosis not present

## 2022-10-08 DIAGNOSIS — J3089 Other allergic rhinitis: Secondary | ICD-10-CM | POA: Diagnosis not present

## 2022-10-11 DIAGNOSIS — L852 Keratosis punctata (palmaris et plantaris): Secondary | ICD-10-CM | POA: Diagnosis not present

## 2022-10-17 DIAGNOSIS — J3081 Allergic rhinitis due to animal (cat) (dog) hair and dander: Secondary | ICD-10-CM | POA: Diagnosis not present

## 2022-10-17 DIAGNOSIS — J301 Allergic rhinitis due to pollen: Secondary | ICD-10-CM | POA: Diagnosis not present

## 2022-10-17 DIAGNOSIS — J3089 Other allergic rhinitis: Secondary | ICD-10-CM | POA: Diagnosis not present

## 2022-10-18 ENCOUNTER — Ambulatory Visit: Payer: Medicare Other | Admitting: Internal Medicine

## 2022-10-25 ENCOUNTER — Ambulatory Visit (INDEPENDENT_AMBULATORY_CARE_PROVIDER_SITE_OTHER): Payer: Medicare Other | Admitting: Internal Medicine

## 2022-10-25 ENCOUNTER — Encounter: Payer: Self-pay | Admitting: Internal Medicine

## 2022-10-25 VITALS — BP 118/68 | HR 80 | Temp 99.3°F | Ht 61.0 in | Wt 105.0 lb

## 2022-10-25 DIAGNOSIS — R002 Palpitations: Secondary | ICD-10-CM

## 2022-10-25 DIAGNOSIS — Z0000 Encounter for general adult medical examination without abnormal findings: Secondary | ICD-10-CM

## 2022-10-25 DIAGNOSIS — L989 Disorder of the skin and subcutaneous tissue, unspecified: Secondary | ICD-10-CM | POA: Diagnosis not present

## 2022-10-25 DIAGNOSIS — E785 Hyperlipidemia, unspecified: Secondary | ICD-10-CM | POA: Diagnosis not present

## 2022-10-25 DIAGNOSIS — E538 Deficiency of other specified B group vitamins: Secondary | ICD-10-CM

## 2022-10-25 DIAGNOSIS — M62838 Other muscle spasm: Secondary | ICD-10-CM | POA: Diagnosis not present

## 2022-10-25 DIAGNOSIS — K219 Gastro-esophageal reflux disease without esophagitis: Secondary | ICD-10-CM | POA: Diagnosis not present

## 2022-10-25 DIAGNOSIS — H6123 Impacted cerumen, bilateral: Secondary | ICD-10-CM | POA: Diagnosis not present

## 2022-10-25 DIAGNOSIS — M8580 Other specified disorders of bone density and structure, unspecified site: Secondary | ICD-10-CM | POA: Diagnosis not present

## 2022-10-25 DIAGNOSIS — R55 Syncope and collapse: Secondary | ICD-10-CM | POA: Diagnosis not present

## 2022-10-25 MED ORDER — CEPHALEXIN 500 MG PO CAPS
500.0000 mg | ORAL_CAPSULE | Freq: Four times a day (QID) | ORAL | 0 refills | Status: DC
Start: 2022-10-25 — End: 2022-11-17

## 2022-10-25 NOTE — Assessment & Plan Note (Signed)
Cont D3 and K2, TUMS Not using Miacalcin

## 2022-10-25 NOTE — Progress Notes (Signed)
Subjective:  Patient ID: [name], female    DOB: 05-05-1951  Age: 72 y.o. MRN: [id number]  CC: No chief complaint on file.   HPI Shantise Beauchesne presents for anxiety, GERD, IBS  Outpatient Medications Prior to Visit  Medication Sig Dispense Refill   ALPRAZolam (XANAX) 0.25 MG tablet 0.5-1 po bid prn anxiety 30 tablet 1   Botulinum Toxin Type A, Cosm, 50 units SOLR Inject 4-6 Units into the muscle. 3 month  injection     calcitonin, salmon, (MIACALCIN/FORTICAL) 200 UNIT/ACT nasal spray Place 1 spray into alternate nostrils daily. 3.7 mL 11   Cholecalciferol (VITAMIN D3 PO) Take 50 mcg by mouth daily.     Cyanocobalamin (B-12) 1000 MCG SUBL PLACE 1 TABLET (1,000 MCG TOTAL) UNDER THE TONGUE DAILY. 100 tablet 3   DEXILANT 60 MG capsule Take 1 capsule (60 mg total) by mouth daily. 30 capsule 11   EPIPEN 2-PAK 0.3 MG/0.3ML SOAJ injection See admin instructions. Reported on 10/08/2015  1   EUCRISA 2 % OINT Apply topically. Apply 1 to 2 times a day     famotidine (PEPCID) 40 MG tablet TAKE 1 TABLET 1 HOUR BEFORE BED 90 tablet 1   loratadine (CLARITIN) 10 MG tablet Take 1 tablet (10 mg total) by mouth daily. 100 tablet 3   mometasone (NASONEX) 50 MCG/ACT nasal spray INSTILL 2 SPRAYS IN EACH NOSTRIL EVERY DAY 51 each 1   Multiple Vitamins-Minerals (ICAPS) TABS Take 1 tablet by mouth 2 (two) times daily.     PREMARIN vaginal cream Use PV as directed 42.5 g 3   timolol (TIMOPTIC) 0.25 % ophthalmic solution SMARTSIG:In Eye(s)     tretinoin (RETIN-A) 0.05 % cream Apply 1 application topically at bedtime.     triamcinolone ointment (KENALOG) 0.1 % Apply topically 2 (two) times daily. 160 g 1   UNABLE TO FIND once a week. Med Name: allergy shots     valACYclovir (VALTREX) 500 MG tablet Take 1 tablet (500 mg total) by mouth 2 (two) times daily. 10 tablet 2   hyoscyamine (LEVSIN) 0.125 MG tablet Take 1 tablet (0.125 mg total) by mouth every 4 (four) hours as needed for up to 10 days (chest  spasms). 100 tablet 3   methylPREDNISolone (MEDROL DOSEPAK) 4 MG TBPK tablet As directed (Patient not taking: Reported on 10/25/2022) 21 tablet 0   No facility-administered medications prior to visit.    ROS: Review of Systems  Constitutional:  Negative for activity change, appetite change, chills, fatigue and unexpected weight change.  HENT:  Negative for congestion, mouth sores and sinus pressure.   Eyes:  Negative for visual disturbance.  Respiratory:  Negative for cough and chest tightness.   Gastrointestinal:  Negative for abdominal pain and nausea.  Genitourinary:  Negative for difficulty urinating, frequency and vaginal pain.  Musculoskeletal:  Negative for arthralgias, back pain and gait problem.  Skin:  Negative for pallor and rash.  Neurological:  Negative for dizziness, tremors, weakness, numbness and headaches.  Psychiatric/Behavioral:  Negative for confusion and sleep disturbance.     Objective:  BP 118/68 (BP Location: Left Arm, Patient Position: Sitting, Cuff Size: Normal)   Pulse 80   Temp 99.3 F (37.4 C) (Oral)   Ht 5\' 1"  (1.549 m)   Wt 105 lb (47.6 kg)   SpO2 98%   BMI 19.84 kg/m   BP Readings from Last 3 Encounters:  10/25/22 118/68  07/16/22 138/72  04/18/22 120/64    Wt Readings from Last 3 Encounters:  10/25/22 105 lb (47.6 kg)  07/16/22 104 lb 12.8 oz (47.5 kg)  04/18/22 104 lb 6.4 oz (47.4 kg)    Physical Exam Constitutional:      General: She is not in acute distress.    Appearance: Normal appearance. She is well-developed.  HENT:     Head: Normocephalic.     Right Ear: External ear normal.     Left Ear: External ear normal.     Nose: Nose normal.  Eyes:     General:        Right eye: No discharge.        Left eye: No discharge.     Conjunctiva/sclera: Conjunctivae normal.     Pupils: Pupils are equal, round, and reactive to light.  Neck:     Thyroid: No thyromegaly.     Vascular: No JVD.     Trachea: No tracheal deviation.   Cardiovascular:     Rate and Rhythm: Normal rate and regular rhythm.     Heart sounds: Normal heart sounds.  Pulmonary:     Effort: No respiratory distress.     Breath sounds: No stridor. No wheezing.  Abdominal:     General: Bowel sounds are normal. There is no distension.     Palpations: Abdomen is soft. There is no mass.     Tenderness: There is no abdominal tenderness. There is no guarding or rebound.  Musculoskeletal:        General: No tenderness.     Cervical back: Normal range of motion and neck supple. No rigidity.  Lymphadenopathy:     Cervical: No cervical adenopathy.  Skin:    Findings: No erythema or rash.  Neurological:     Cranial Nerves: No cranial nerve deficit.     Motor: No abnormal muscle tone.     Coordination: Coordination normal.     Deep Tendon Reflexes: Reflexes normal.  Psychiatric:        Behavior: Behavior normal.        Thought Content: Thought content normal.        Judgment: Judgment normal.     Lab Results  Component Value Date   WBC 3.5 (L) 01/23/2022   HGB 12.4 01/23/2022   HCT 36.4 01/23/2022   PLT 179.0 01/23/2022   GLUCOSE 89 04/18/2022   CHOL 206 (H) 01/23/2022   TRIG 62.0 01/23/2022   HDL 64.40 01/23/2022   LDLCALC 129 (H) 01/23/2022   ALT 14 04/18/2022   AST 16 04/18/2022   NA 138 04/18/2022   K 4.2 04/18/2022   CL 103 04/18/2022   CREATININE 0.78 04/18/2022   BUN 21 04/18/2022   CO2 31 04/18/2022   TSH 1.48 04/18/2022    No results found.  Assessment & Plan:   Problem List Items Addressed This Visit     Palpitations   Relevant Orders   TSH   CBC with Differential/Platelet   Skin lesion - Primary    R lower eyelid 1 mm flat wart      Vasovagal syncope   Relevant Medications   cephALEXin (KEFLEX) 500 MG capsule   Other Relevant Orders   TSH   Urinalysis   CBC with Differential/Platelet   Lipid panel   Comprehensive metabolic panel   GERD (gastroesophageal reflux disease)   Relevant Orders   TSH    Urinalysis   CBC with Differential/Platelet   Lipid panel   Comprehensive metabolic panel   Well adult exam   Relevant Orders   TSH  Urinalysis   CBC with Differential/Platelet   Lipid panel   Comprehensive metabolic panel   Osteopenia    Cont D3 and K2, TUMS Not using Miacalcin      B12 deficiency    On B12      Other Visit Diagnoses     Dyslipidemia       Relevant Orders   Lipid panel         Meds ordered this encounter  Medications   cephALEXin (KEFLEX) 500 MG capsule    Sig: Take 1 capsule (500 mg total) by mouth 4 (four) times daily.    Dispense:  40 capsule    Refill:  0      Follow-up: Return in about 4 months (around 02/25/2023) for Wellness Exam.  Sonda Primes, MD

## 2022-10-25 NOTE — Assessment & Plan Note (Signed)
R lower eyelid 1 mm flat wart

## 2022-10-25 NOTE — Assessment & Plan Note (Deleted)
On Dexilant, d/c Famotidine Try Carafate GI appt on 6/6/2

## 2022-10-25 NOTE — Assessment & Plan Note (Signed)
On B12 

## 2022-10-26 DIAGNOSIS — J301 Allergic rhinitis due to pollen: Secondary | ICD-10-CM | POA: Diagnosis not present

## 2022-10-26 DIAGNOSIS — J3081 Allergic rhinitis due to animal (cat) (dog) hair and dander: Secondary | ICD-10-CM | POA: Diagnosis not present

## 2022-10-26 DIAGNOSIS — J3089 Other allergic rhinitis: Secondary | ICD-10-CM | POA: Diagnosis not present

## 2022-10-31 DIAGNOSIS — J3089 Other allergic rhinitis: Secondary | ICD-10-CM | POA: Diagnosis not present

## 2022-10-31 DIAGNOSIS — J301 Allergic rhinitis due to pollen: Secondary | ICD-10-CM | POA: Diagnosis not present

## 2022-10-31 DIAGNOSIS — J3081 Allergic rhinitis due to animal (cat) (dog) hair and dander: Secondary | ICD-10-CM | POA: Diagnosis not present

## 2022-11-02 ENCOUNTER — Encounter: Payer: Self-pay | Admitting: Cardiovascular Disease

## 2022-11-06 ENCOUNTER — Ambulatory Visit: Payer: BLUE CROSS/BLUE SHIELD | Admitting: Internal Medicine

## 2022-11-06 DIAGNOSIS — H04123 Dry eye syndrome of bilateral lacrimal glands: Secondary | ICD-10-CM | POA: Diagnosis not present

## 2022-11-07 DIAGNOSIS — J3089 Other allergic rhinitis: Secondary | ICD-10-CM | POA: Diagnosis not present

## 2022-11-07 DIAGNOSIS — J3081 Allergic rhinitis due to animal (cat) (dog) hair and dander: Secondary | ICD-10-CM | POA: Diagnosis not present

## 2022-11-07 DIAGNOSIS — J301 Allergic rhinitis due to pollen: Secondary | ICD-10-CM | POA: Diagnosis not present

## 2022-11-08 ENCOUNTER — Encounter: Payer: Self-pay | Admitting: Family Medicine

## 2022-11-08 ENCOUNTER — Ambulatory Visit (INDEPENDENT_AMBULATORY_CARE_PROVIDER_SITE_OTHER): Payer: Medicare Other | Admitting: Family Medicine

## 2022-11-08 VITALS — BP 110/68 | HR 70 | Ht 61.0 in | Wt 103.8 lb

## 2022-11-08 DIAGNOSIS — M545 Low back pain, unspecified: Secondary | ICD-10-CM

## 2022-11-08 DIAGNOSIS — M810 Age-related osteoporosis without current pathological fracture: Secondary | ICD-10-CM | POA: Diagnosis not present

## 2022-11-08 LAB — URINALYSIS
Bilirubin Urine: NEGATIVE
Ketones, ur: NEGATIVE
Leukocytes,Ua: NEGATIVE
Nitrite: NEGATIVE
Specific Gravity, Urine: 1.015 (ref 1.000–1.030)
Total Protein, Urine: NEGATIVE
Urine Glucose: NEGATIVE
Urobilinogen, UA: 0.2 (ref 0.0–1.0)
pH: 5.5 (ref 5.0–8.0)

## 2022-11-08 NOTE — Patient Instructions (Signed)
Thank you for coming in today.   Plan for PT in the future.   OK to use heat and TENS unit.   Let me know if you get bad and would like muscle relaxer.   Tylneol is OK.   Let me know if this is not working.    I am sending you a note about DEXA scan for bone density in September.   TENS UNIT: This is helpful for muscle pain and spasm.   Search and Purchase a TENS 7000 2nd edition at  www.tenspros.com or www.Amazon.com It should be less than $30.     TENS unit instructions: Do not shower or bathe with the unit on Turn the unit off before removing electrodes or batteries If the electrodes lose stickiness add a drop of water to the electrodes after they are disconnected from the unit and place on plastic sheet. If you continued to have difficulty, call the TENS unit company to purchase more electrodes. Do not apply lotion on the skin area prior to use. Make sure the skin is clean and dry as this will help prolong the life of the electrodes. After use, always check skin for unusual red areas, rash or other skin difficulties. If there are any skin problems, does not apply electrodes to the same area. Never remove the electrodes from the unit by pulling the wires. Do not use the TENS unit or electrodes other than as directed. Do not change electrode placement without consultating your therapist or physician. Keep 2 fingers with between each electrode. Wear time ratio is 2:1, on to off times.    For example on for 30 minutes off for 15 minutes and then on for 30 minutes off for 15 minutes

## 2022-11-08 NOTE — Progress Notes (Signed)
I, Stevenson Clinch, CMA acting as a scribe for [name] Graham, MD.  [name] is a 72 y.o. female who presents to [facility] at [facility] today for back pain. Pt was last seen by Dr. Denyse Amass on 04/16/22 for a lumbar back strain and to discuss osteoporosis treatment. She was advised to cont resistance training, heating pad, and HEP.   Today, pt reports flare-up over the past 2 days, no injury. With this flare-up, sx are mostly right-sided.  Sx are focal. Sx worse with bending down. Has been using heat with short-term relief.   She is worried today's pain could represent a kidney stone.  She has had kidney stones in the past that started somewhat similar.  She denies urinary frequency and urgency.    Dx testing: 02/23/22 DEXA  08/23/2021 L-spine & L hip x-ray   Pertinent review of systems: No fevers or chills  Relevant historical information: Osteoporosis.   Exam:  BP 110/68   Pulse 70   Ht 5\' 1"  (1.549 m)   Wt 103 lb 12.8 oz (47.1 kg)   SpO2 97%   BMI 19.61 kg/m  General: Well Developed, well nourished, and in no acute distress.   MSK: L-spine nontender palpation spinal midline.  Mildly tender palpation right lumbar paraspinal musculature. Decreased lumbar motion. Lower extremity strength is intact. Nontender percussion right flank.    Lab and Radiology Results  EXAM: LUMBAR SPINE - 2-3 VIEW   COMPARISON:  X-ray lumbar 08/08/2017.   FINDINGS: Generalized facet osteoarthritis with greatest spurring at L3-4 and below. Mild anterolisthesis at L4-5. Disc space narrowing diffusely with mild endplate spurring greatest at L1-2. No evidence of fracture or bone lesion. Calcification of the bilateral flank not seen on the lateral view, likely costal cartilage.   IMPRESSION: 1. No acute finding or significant change from 2019. 2. Lumbar degeneration primarily affecting facets with L4-5 anterolisthesis.     Electronically Signed   By: Tiburcio Pea  M.D.   On: 08/24/2021 04:05 I, [name], personally (independently) visualized and performed the interpretation of the images attached in this note.  Bone density September 2023 showed osteoporosis with T-score -2.7.    Assessment and Plan: 72 y.o. female with right low back pain thought to be due to muscle spasm and dysfunction.  Plan for physical therapy especially if not improving spontaneously.  We talked about muscle relaxers and mutually decided to hold off on them for now.  Tylenol and ibuprofen okay to use as needed.  Will check urinalysis to look for hematuria for kidney stone.  This is less likely but is a possibility.  As for osteoporosis we talked about that too.  She has osteoporosis with a T-score -2.7 when checked September.  She currently is managing her osteoporosis conservatively with vitamin D and vitamin K2 and weightbearing exercise.  We did talk about Prolia last year she decided to try the vitamin D and weightbearing exercise.  We discussed and decided to consider rechecking bone density 1 year which would be September 2024.  Would use [facility] presents for her last bone density was done.  If not better or if worse strongly recommend more active medical treatment such as Prolia or Evenity.   PDMP not reviewed this encounter. Orders Placed This Encounter  Procedures   Urinalysis    Standing Status:   Future    Number of Occurrences:   1    Standing Expiration Date:   11/08/2023   Ambulatory referral to Physical  Therapy    Referral Priority:   Routine    Referral Type:   Physical Medicine    Referral Reason:   Specialty Services Required    Requested Specialty:   Physical Therapy    Number of Visits Requested:   1   No orders of the defined types were placed in this encounter.    Discussed warning signs or symptoms. Please see discharge instructions. Patient expresses understanding.   The above documentation has been reviewed and is accurate and complete [name], M.D.

## 2022-11-09 NOTE — Progress Notes (Signed)
There is a trace of blood in your urine which could indicate a possible kidney stone.  If your pain is accelerating or worsening we should obtain a CT scan of your abdomen and pelvis to look for kidney stones.

## 2022-11-10 ENCOUNTER — Other Ambulatory Visit: Payer: Self-pay | Admitting: Internal Medicine

## 2022-11-13 DIAGNOSIS — J3089 Other allergic rhinitis: Secondary | ICD-10-CM | POA: Diagnosis not present

## 2022-11-15 NOTE — Progress Notes (Unsigned)
Subjective:   Patient ID: [name], female    DOB: 05-Apr-1951  MRN: [id number]    Brief patient profile:  24 yowf never smoker with new onset " throat drainage" w/in a few years of arriving in [place] in late 80's and placed on shots for up to 10 years some better worse 3 years off shots and back on shots ever since but still having intermittent problems with tickle in throat dx as vcd in early 2000s sometimes  exac by cig smoke, rx with relaxation techniques but no meds effective to date followed for UACS in pulmonary clinic since 08/15/12    History of Present Illness  08/15/2012 1st pulmonary eval cc recurrent tickling throat leading to severe choking spells started 08/08/12 p latex enamel  paint  Exposure  in her  house   rx by UC then ER then [facility] rx zmax and robitissuin. Thinks may have had gerd prior to last episode but denies any obvious chronic gerd or overt sinus symptoms assoc itch sneeze or ant rhinorrhea. No other obvious exp hex. Only sob with cough, no wheeze, not brought on be exercise.   Last episode severe tickle/ choking was > 1 year prior to OV  rec The key to effective treatment for your cough is eliminating the non-stop cycle of cough you're stuck in long enough to let your airway heal completely and then see if there is anything still making you cough once you stop the cough suppression, but this should take no more than 5 days to figuure out  First take delsym two tsp every 12 hours and supplement if needed with  tramadol 50 mg up to 2 every 4 hours to suppress the urge to cough. Swallowing water or using ice chips/non mint and menthol containing candies (such as lifesavers or sugarless jolly ranchers) are also effective.  You should rest your voice and avoid activities that you know make you cough. Once you have eliminated the cough for 3 straight days try reducing the tramadol first,  then the delsym as tolerated.   Pantoprazole 40 mg  Take 30-60 min before first  meal of the day and Zantac (ranitidine) 300 mg  one bedtime until cough is completely gone for at least a week without the need for cough suppression GERD diet  08/04/2014 f/u ov/[name] re: recurrent cough once reduced protonix from 40 to 20 and not using pepcid as per last rec  Chief Complaint  Patient presents with   Follow-up    Pt states that she decreased protonix to 20 mg about 1 wk ago, and since then has had increased cough. Cough is prod with clear to yellow/green sputum.   assoc with sensation of too much throat mucus esp w/in a few hours of stirring, no noct spells Not limited by breathing from desired activities    Rec Protonix 40 mg Take 30-60 min before first meal of the day and pepcid 20 mg and chlortrimeton 4 mg at bedtime to see if problem resolves and if not return here For drainage take chlortrimeton (chlorpheniramine) 4 mg every 4 hours available over the counter (may cause drowsiness)  GERD diet  If problems swallowing or further problems with pantoprazole please see Dr Yancey Flemings - pulmonary follow up is as needed     11/16/2022  Re-establish  ov/[name] re: recurrent coughing spasms maint on dexilant   Chief Complaint  Patient presents with   Consult    Coughing spells with throat spasms.  Sx reoccuring  about 3 months  Dyspnea:  not a problem/ aerobics ok  Cough: 3 bad spells in 3 months daytime severe coughing fits to point she thought she was going to choke, not directly related to eating / night time tickle about  an hour  p HS x about a year up to 3-4/week  SABA use: none 02: none  Rhinitis on clariton poorly controlled  - has not tried zyrtec or 1st gen H1 yet     No obvious day to day or daytime variability or assoc excess/ purulent sputum or mucus plugs or hemoptysis or cp or chest tightness, subjective wheeze or overt   hb symptoms.     Also denies any obvious fluctuation of symptoms with weather or environmental changes or other aggravating or alleviating  factors except as outlined above   No unusual exposure hx or h/o childhood pna/ asthma or knowledge of premature birth.  Current Allergies, Complete Past Medical History, Past Surgical History, Family History, and Social History were reviewed in [employer] record.  ROS  The following are not active complaints unless bolded Hoarseness, sore throat, dysphagia, dental problems, itching, sneezing,  nasal congestion or discharge of excess mucus or purulent secretions, ear ache,   fever, chills, sweats, unintended wt loss or wt gain, classically pleuritic or exertional cp,  orthopnea pnd or arm/hand swelling  or leg swelling, presyncope, palpitations, abdominal pain, anorexia, nausea, vomiting, diarrhea  or change in bowel habits or change in bladder habits, change in stools or change in urine, dysuria, hematuria,  rash, arthralgias, visual complaints, headache, numbness, weakness or ataxia or problems with walking or coordination,  change in mood or  memory.        Current Meds  Medication Sig   Botulinum Toxin Type A, Cosm, 50 units SOLR Inject 4-6 Units into the muscle. 3 month  injection   Cholecalciferol (VITAMIN D3 PO) Take 50 mcg by mouth daily.   Cyanocobalamin (B-12) 1000 MCG SUBL PLACE 1 TABLET (1,000 MCG TOTAL) UNDER THE TONGUE DAILY.   DEXILANT 60 MG capsule Take 1 capsule (60 mg total) by mouth daily.   EPIPEN 2-PAK 0.3 MG/0.3ML SOAJ injection See admin instructions. Reported on 10/08/2015   EUCRISA 2 % OINT Apply topically. Apply 1 to 2 times a day   famotidine (PEPCID) 40 MG tablet TAKE 1 TABLET 1 HOUR BEFORE BED   loratadine (CLARITIN) 10 MG tablet Take 1 tablet (10 mg total) by mouth daily. (Patient taking differently: Take 10 mg by mouth daily as needed for allergies, rhinitis or itching.)   mometasone (NASONEX) 50 MCG/ACT nasal spray USE 2 SPRAYS IN EACH NOSTRIL EVERY DAY   PREMARIN vaginal cream Use PV as directed   timolol (TIMOPTIC) 0.25 % ophthalmic  solution SMARTSIG:In Eye(s)   tretinoin (RETIN-A) 0.05 % cream Apply 1 application topically at bedtime.   triamcinolone ointment (KENALOG) 0.1 % Apply topically 2 (two) times daily.   UNABLE TO FIND once a week. Med Name: allergy shots   valACYclovir (VALTREX) 500 MG tablet Take 1 tablet (500 mg total) by mouth 2 (two) times daily.                    Objective:   Physical Exam  Wts   11/16/2022      101    12/01/2013        104 > 08/04/2014  105  Wt Readings from Last 3 Encounters:  08/15/12 104 lb (47.174 kg)  08/14/12 104 lb (  47.174 kg)  08/12/12 104 lb (47.174 kg)     Vital signs reviewed  11/16/2022  - Note at rest 02 sats  97% on RA   General appearance:    amb very well preserved amb wf nad   HEENT : Oropharynx  mild erythema/ no thrush or cobblestoning     NECK :  without  apparent JVD/ palpable Nodes/TM    LUNGS: no acc muscle use,  Nl contour chest which is clear to A and P bilaterally without cough on insp or exp maneuvers   CV:  RRR  no s3 or murmur or increase in P2, and no edema   ABD:  soft and nontender with nl inspiratory excursion in the supine position. No bruits or organomegaly appreciated   MS:  Nl gait/ ext warm without deformities Or obvious joint restrictions  calf tenderness, cyanosis or clubbing    SKIN: warm and dry without lesions    NEURO:  alert, approp, nl sensorium with  no motor or cerebellar deficits apparent.       CXR PA and Lateral:   11/16/2022 :    I personally reviewed images and impression is as follows:     No acute findings     Assessment & Plan:

## 2022-11-16 ENCOUNTER — Ambulatory Visit (INDEPENDENT_AMBULATORY_CARE_PROVIDER_SITE_OTHER): Payer: Medicare Other | Admitting: Internal Medicine

## 2022-11-16 ENCOUNTER — Ambulatory Visit (INDEPENDENT_AMBULATORY_CARE_PROVIDER_SITE_OTHER): Payer: Medicare Other

## 2022-11-16 ENCOUNTER — Encounter: Payer: Self-pay | Admitting: Internal Medicine

## 2022-11-16 VITALS — BP 120/64 | HR 78 | Temp 98.1°F | Ht 61.0 in | Wt 101.6 lb

## 2022-11-16 DIAGNOSIS — R058 Other specified cough: Secondary | ICD-10-CM

## 2022-11-16 DIAGNOSIS — R059 Cough, unspecified: Secondary | ICD-10-CM | POA: Diagnosis not present

## 2022-11-16 NOTE — Patient Instructions (Addendum)
Dr Harriette Ohara is in ENT dept at [facility]  Continue dexilant 30 min before bfast and add pepcid(famotidine)  20 mg an hour before bedtime  For drainage / throat tickle try take CHLORPHENIRAMINE  4 mg  ("Allergy Relief" 4mg   at [facility] should be easiest to find in the blue box usually on bottom shelf)  take one every 4 hours as needed - extremely effective and inexpensive over the counter- may cause drowsiness so start with just a dose or two an hour before bedtime and see how you tolerate it before trying in daytime.   If can't take chlorpheniramine during the day ok to take  zyrtec 10 mg in am as needed for allergy symptoms  GERD (REFLUX)  is an extremely common cause of respiratory symptoms just like yours , many times with no obvious heartburn at all.    It can be treated with medication, but also with lifestyle changes including elevation of the head of your bed (ideally with 6 -8inch blocks under the headboard of your bed),  Smoking cessation, avoidance of late meals, excessive alcohol, and avoid fatty foods, chocolate, peppermint, colas, red wine, and acidic juices such as orange juice.  NO MINT OR MENTHOL PRODUCTS SO NO COUGH DROPS - LUDENs  USE SUGARLESS CANDY INSTEAD (Jolley ranchers or [employer] or Environmental manager) or even ice chips will also do - the key is to swallow to prevent all throat clearing. NO OIL BASED VITAMINS - use powdered substitutes.  Avoid fish oil when coughing.   Please remember to go to the  x-ray department  for your tests - we will call you with the results when they are available    Pulmonary follow up is as needed

## 2022-11-17 NOTE — Assessment & Plan Note (Signed)
Recurrent 2024 assoc with noct "throat tickle" despite rx with max ppi/h2  - 11/16/2022  rec add  1st gen H1 blockers per guidelines  and if can't tol then zyrtec instead of clariton  - 11/16/2022 reviewed GERD diet/ lifestyle / bed blocks > refer to Dr Delford Field at [facility] if persists   Of the three most common causes of  Sub-acute / recurrent or chronic cough, only one (GERD)  can actually contribute to/ trigger  the other two (asthma and post nasal drip syndrome)  and perpetuate the cylce of cough.  While not intuitively obvious, many patients with chronic low grade reflux do not cough until there is a primary insult that disturbs the protective epithelial barrier and exposes sensitive nerve endings.   This is typically viral but can due to PNDS and  either may apply here.   The point is that once this occurs, it is difficult to eliminate the cycle  using anything but a maximally effective acid suppression regimen at least in the short run, accompanied by an appropriate diet to address non acid GERD and control / eliminate the any pnds with 1st gen H1 blockers per guidelines  if tolerated with low threshold to refer to ENT/ [facility] Dr Lynelle Doctor if persists  Pulmonary f/u can be prn         Each maintenance medication was reviewed in detail including emphasizing most importantly the difference between maintenance and prns and under what circumstances the prns are to be triggered using an action plan format where appropriate.  Total time for H and P, chart review, counseling,  and generating customized AVS unique to this office visit / same day charting > 30 min for pt not seen in > 3 y

## 2022-11-19 ENCOUNTER — Encounter: Payer: Self-pay | Admitting: Internal Medicine

## 2022-11-19 DIAGNOSIS — J3089 Other allergic rhinitis: Secondary | ICD-10-CM | POA: Diagnosis not present

## 2022-11-19 DIAGNOSIS — J3081 Allergic rhinitis due to animal (cat) (dog) hair and dander: Secondary | ICD-10-CM | POA: Diagnosis not present

## 2022-11-19 DIAGNOSIS — J301 Allergic rhinitis due to pollen: Secondary | ICD-10-CM | POA: Diagnosis not present

## 2022-11-19 MED ORDER — FAMOTIDINE 20 MG PO TABS: ORAL_TABLET | ORAL | 2 refills | Status: DC

## 2022-11-28 DIAGNOSIS — J3081 Allergic rhinitis due to animal (cat) (dog) hair and dander: Secondary | ICD-10-CM | POA: Diagnosis not present

## 2022-11-28 DIAGNOSIS — J3089 Other allergic rhinitis: Secondary | ICD-10-CM | POA: Diagnosis not present

## 2022-11-28 DIAGNOSIS — J301 Allergic rhinitis due to pollen: Secondary | ICD-10-CM | POA: Diagnosis not present

## 2022-11-29 DIAGNOSIS — H00025 Hordeolum internum left lower eyelid: Secondary | ICD-10-CM | POA: Diagnosis not present

## 2022-12-03 DIAGNOSIS — J3081 Allergic rhinitis due to animal (cat) (dog) hair and dander: Secondary | ICD-10-CM | POA: Diagnosis not present

## 2022-12-03 DIAGNOSIS — J3089 Other allergic rhinitis: Secondary | ICD-10-CM | POA: Diagnosis not present

## 2022-12-03 DIAGNOSIS — J301 Allergic rhinitis due to pollen: Secondary | ICD-10-CM | POA: Diagnosis not present

## 2022-12-11 DIAGNOSIS — Z86018 Personal history of other benign neoplasm: Secondary | ICD-10-CM | POA: Diagnosis not present

## 2022-12-11 DIAGNOSIS — D225 Melanocytic nevi of trunk: Secondary | ICD-10-CM | POA: Diagnosis not present

## 2022-12-11 DIAGNOSIS — J3089 Other allergic rhinitis: Secondary | ICD-10-CM | POA: Diagnosis not present

## 2022-12-11 DIAGNOSIS — L821 Other seborrheic keratosis: Secondary | ICD-10-CM | POA: Diagnosis not present

## 2022-12-11 DIAGNOSIS — L84 Corns and callosities: Secondary | ICD-10-CM | POA: Diagnosis not present

## 2022-12-11 DIAGNOSIS — J3081 Allergic rhinitis due to animal (cat) (dog) hair and dander: Secondary | ICD-10-CM | POA: Diagnosis not present

## 2022-12-11 DIAGNOSIS — D1800 Hemangioma unspecified site: Secondary | ICD-10-CM | POA: Diagnosis not present

## 2022-12-11 DIAGNOSIS — J301 Allergic rhinitis due to pollen: Secondary | ICD-10-CM | POA: Diagnosis not present

## 2022-12-11 DIAGNOSIS — L578 Other skin changes due to chronic exposure to nonionizing radiation: Secondary | ICD-10-CM | POA: Diagnosis not present

## 2022-12-11 DIAGNOSIS — D224 Melanocytic nevi of scalp and neck: Secondary | ICD-10-CM | POA: Diagnosis not present

## 2022-12-11 DIAGNOSIS — D2230 Melanocytic nevi of unspecified part of face: Secondary | ICD-10-CM | POA: Diagnosis not present

## 2022-12-11 DIAGNOSIS — Z808 Family history of malignant neoplasm of other organs or systems: Secondary | ICD-10-CM | POA: Diagnosis not present

## 2022-12-17 DIAGNOSIS — J3081 Allergic rhinitis due to animal (cat) (dog) hair and dander: Secondary | ICD-10-CM | POA: Diagnosis not present

## 2022-12-17 DIAGNOSIS — J3089 Other allergic rhinitis: Secondary | ICD-10-CM | POA: Diagnosis not present

## 2022-12-17 DIAGNOSIS — J301 Allergic rhinitis due to pollen: Secondary | ICD-10-CM | POA: Diagnosis not present

## 2022-12-24 DIAGNOSIS — J3081 Allergic rhinitis due to animal (cat) (dog) hair and dander: Secondary | ICD-10-CM | POA: Diagnosis not present

## 2022-12-24 DIAGNOSIS — J301 Allergic rhinitis due to pollen: Secondary | ICD-10-CM | POA: Diagnosis not present

## 2022-12-24 DIAGNOSIS — J3089 Other allergic rhinitis: Secondary | ICD-10-CM | POA: Diagnosis not present

## 2022-12-27 ENCOUNTER — Other Ambulatory Visit: Payer: Self-pay | Admitting: Internal Medicine

## 2022-12-31 ENCOUNTER — Encounter: Payer: Self-pay | Admitting: Internal Medicine

## 2022-12-31 DIAGNOSIS — J3081 Allergic rhinitis due to animal (cat) (dog) hair and dander: Secondary | ICD-10-CM | POA: Diagnosis not present

## 2022-12-31 DIAGNOSIS — J3089 Other allergic rhinitis: Secondary | ICD-10-CM | POA: Diagnosis not present

## 2022-12-31 DIAGNOSIS — J301 Allergic rhinitis due to pollen: Secondary | ICD-10-CM | POA: Diagnosis not present

## 2023-01-04 ENCOUNTER — Other Ambulatory Visit: Payer: Self-pay | Admitting: Internal Medicine

## 2023-01-04 MED ORDER — LINACLOTIDE 72 MCG PO CAPS: 72.0000 ug | ORAL_CAPSULE | Freq: Every day | ORAL | 5 refills | Status: DC

## 2023-01-07 DIAGNOSIS — J3089 Other allergic rhinitis: Secondary | ICD-10-CM | POA: Diagnosis not present

## 2023-01-07 DIAGNOSIS — J301 Allergic rhinitis due to pollen: Secondary | ICD-10-CM | POA: Diagnosis not present

## 2023-01-07 DIAGNOSIS — J3081 Allergic rhinitis due to animal (cat) (dog) hair and dander: Secondary | ICD-10-CM | POA: Diagnosis not present

## 2023-01-15 DIAGNOSIS — J3089 Other allergic rhinitis: Secondary | ICD-10-CM | POA: Diagnosis not present

## 2023-01-16 DIAGNOSIS — J3089 Other allergic rhinitis: Secondary | ICD-10-CM | POA: Diagnosis not present

## 2023-01-16 NOTE — Telephone Encounter (Signed)
error 

## 2023-01-21 ENCOUNTER — Ambulatory Visit: Payer: BLUE CROSS/BLUE SHIELD | Admitting: Internal Medicine

## 2023-01-21 DIAGNOSIS — J3081 Allergic rhinitis due to animal (cat) (dog) hair and dander: Secondary | ICD-10-CM | POA: Diagnosis not present

## 2023-01-21 DIAGNOSIS — J301 Allergic rhinitis due to pollen: Secondary | ICD-10-CM | POA: Diagnosis not present

## 2023-01-21 DIAGNOSIS — J3089 Other allergic rhinitis: Secondary | ICD-10-CM | POA: Diagnosis not present

## 2023-01-22 ENCOUNTER — Encounter: Payer: Self-pay | Admitting: Internal Medicine

## 2023-01-25 NOTE — Telephone Encounter (Signed)
Patient called to check on the status of her request. She said she would like the brand name instead of the generic. Best callback is [phone].

## 2023-01-25 NOTE — Telephone Encounter (Signed)
Pls advise last refilled 09/2021.Marland KitchenRaechel Chute

## 2023-01-28 DIAGNOSIS — J3081 Allergic rhinitis due to animal (cat) (dog) hair and dander: Secondary | ICD-10-CM | POA: Diagnosis not present

## 2023-01-28 DIAGNOSIS — J3089 Other allergic rhinitis: Secondary | ICD-10-CM | POA: Diagnosis not present

## 2023-01-28 DIAGNOSIS — J301 Allergic rhinitis due to pollen: Secondary | ICD-10-CM | POA: Diagnosis not present

## 2023-01-29 ENCOUNTER — Other Ambulatory Visit: Payer: Self-pay | Admitting: Internal Medicine

## 2023-01-29 MED ORDER — PREMARIN 0.625 MG/GM VA CREA: TOPICAL_CREAM | VAGINAL | 5 refills | Status: DC

## 2023-01-29 NOTE — Telephone Encounter (Signed)
Patient needs to pick up this rx at the front counter here.  Please call patient when it has been done.  [phone]

## 2023-01-29 NOTE — Telephone Encounter (Signed)
Printed rx waiting on MD to sign.Marland KitchenRaechel Chute

## 2023-01-31 ENCOUNTER — Encounter (INDEPENDENT_AMBULATORY_CARE_PROVIDER_SITE_OTHER): Payer: Self-pay

## 2023-02-01 DIAGNOSIS — Z681 Body mass index (BMI) 19 or less, adult: Secondary | ICD-10-CM | POA: Diagnosis not present

## 2023-02-01 DIAGNOSIS — W57XXXA Bitten or stung by nonvenomous insect and other nonvenomous arthropods, initial encounter: Secondary | ICD-10-CM | POA: Diagnosis not present

## 2023-02-01 DIAGNOSIS — S80862A Insect bite (nonvenomous), left lower leg, initial encounter: Secondary | ICD-10-CM | POA: Diagnosis not present

## 2023-02-04 DIAGNOSIS — J3081 Allergic rhinitis due to animal (cat) (dog) hair and dander: Secondary | ICD-10-CM | POA: Diagnosis not present

## 2023-02-04 DIAGNOSIS — J3089 Other allergic rhinitis: Secondary | ICD-10-CM | POA: Diagnosis not present

## 2023-02-04 DIAGNOSIS — J301 Allergic rhinitis due to pollen: Secondary | ICD-10-CM | POA: Diagnosis not present

## 2023-02-06 DIAGNOSIS — L309 Dermatitis, unspecified: Secondary | ICD-10-CM | POA: Diagnosis not present

## 2023-02-11 DIAGNOSIS — J3081 Allergic rhinitis due to animal (cat) (dog) hair and dander: Secondary | ICD-10-CM | POA: Diagnosis not present

## 2023-02-11 DIAGNOSIS — J3089 Other allergic rhinitis: Secondary | ICD-10-CM | POA: Diagnosis not present

## 2023-02-11 DIAGNOSIS — J301 Allergic rhinitis due to pollen: Secondary | ICD-10-CM | POA: Diagnosis not present

## 2023-02-13 ENCOUNTER — Ambulatory Visit: Payer: BLUE CROSS/BLUE SHIELD | Admitting: Internal Medicine

## 2023-02-19 DIAGNOSIS — J3081 Allergic rhinitis due to animal (cat) (dog) hair and dander: Secondary | ICD-10-CM | POA: Diagnosis not present

## 2023-02-19 DIAGNOSIS — J3089 Other allergic rhinitis: Secondary | ICD-10-CM | POA: Diagnosis not present

## 2023-02-19 DIAGNOSIS — J301 Allergic rhinitis due to pollen: Secondary | ICD-10-CM | POA: Diagnosis not present

## 2023-02-25 ENCOUNTER — Encounter: Payer: BLUE CROSS/BLUE SHIELD | Admitting: Internal Medicine

## 2023-02-26 ENCOUNTER — Telehealth: Payer: Self-pay | Admitting: Internal Medicine

## 2023-02-26 NOTE — Telephone Encounter (Signed)
Left message for pt to call back  °

## 2023-02-26 NOTE — Telephone Encounter (Signed)
Inbound call from patient requesting to speak with a nurse to possible scheduled for hemorrhoid banding.   Please advise. Thank you

## 2023-02-26 NOTE — Telephone Encounter (Signed)
Pt scheduled for hem banding #1 04/23/23 at 9:30am. Pt aware of appt.

## 2023-02-26 NOTE — Telephone Encounter (Signed)
Discussed banding with patient and she is aware of procedure. Pt will call back when she is ready to schedule.

## 2023-02-26 NOTE — Telephone Encounter (Signed)
Patient called stating that she has decided to have the banding,  and has some further questions. Requesting a call to discuss. Please advise.

## 2023-02-27 ENCOUNTER — Other Ambulatory Visit (INDEPENDENT_AMBULATORY_CARE_PROVIDER_SITE_OTHER): Payer: Medicare Other

## 2023-02-27 DIAGNOSIS — R002 Palpitations: Secondary | ICD-10-CM | POA: Diagnosis not present

## 2023-02-27 DIAGNOSIS — K219 Gastro-esophageal reflux disease without esophagitis: Secondary | ICD-10-CM

## 2023-02-27 DIAGNOSIS — E785 Hyperlipidemia, unspecified: Secondary | ICD-10-CM | POA: Diagnosis not present

## 2023-02-27 DIAGNOSIS — R55 Syncope and collapse: Secondary | ICD-10-CM

## 2023-02-27 DIAGNOSIS — Z0000 Encounter for general adult medical examination without abnormal findings: Secondary | ICD-10-CM | POA: Diagnosis not present

## 2023-02-27 LAB — COMPREHENSIVE METABOLIC PANEL
ALT: 14 U/L (ref 0–35)
AST: 18 U/L (ref 0–37)
Albumin: 4.2 g/dL (ref 3.5–5.2)
Alkaline Phosphatase: 68 U/L (ref 39–117)
BUN: 15 mg/dL (ref 6–23)
CO2: 31 meq/L (ref 19–32)
Calcium: 9.8 mg/dL (ref 8.4–10.5)
Chloride: 103 meq/L (ref 96–112)
Creatinine, Ser: 0.86 mg/dL (ref 0.40–1.20)
GFR: 68.48 mL/min (ref >60.00)
Glucose, Bld: 87 mg/dL (ref 70–99)
Potassium: 3.8 meq/L (ref 3.5–5.1)
Sodium: 140 meq/L (ref 135–145)
Total Bilirubin: 0.6 mg/dL (ref 0.2–1.2)
Total Protein: 7.2 g/dL (ref 6.0–8.3)

## 2023-02-27 LAB — URINALYSIS
Bilirubin Urine: NEGATIVE
Hgb urine dipstick: NEGATIVE
Ketones, ur: NEGATIVE
Leukocytes,Ua: NEGATIVE
Nitrite: NEGATIVE
Specific Gravity, Urine: 1.015 (ref 1.000–1.030)
Total Protein, Urine: NEGATIVE
Urine Glucose: NEGATIVE
Urobilinogen, UA: 0.2 (ref 0.0–1.0)
pH: 7.5 (ref 5.0–8.0)

## 2023-02-27 LAB — CBC WITH DIFFERENTIAL/PLATELET
Basophils Absolute: 0.1 10*3/uL (ref 0.0–0.1)
Basophils Relative: 1.4 % (ref 0.0–3.0)
Eosinophils Absolute: 0.2 10*3/uL (ref 0.0–0.7)
Eosinophils Relative: 3.9 % (ref 0.0–5.0)
HCT: 39 % (ref 36.0–46.0)
Hemoglobin: 12.9 g/dL (ref 12.0–15.0)
Lymphocytes Relative: 36.1 % (ref 12.0–46.0)
Lymphs Abs: 1.4 10*3/uL (ref 0.7–4.0)
MCHC: 33 g/dL (ref 30.0–36.0)
MCV: 93.1 fl (ref 78.0–100.0)
Monocytes Absolute: 0.4 10*3/uL (ref 0.1–1.0)
Monocytes Relative: 9.4 % (ref 3.0–12.0)
Neutro Abs: 1.9 10*3/uL (ref 1.4–7.7)
Neutrophils Relative %: 49.2 % (ref 43.0–77.0)
Platelets: 191 10*3/uL (ref 150.0–400.0)
RBC: 4.19 Mil/uL (ref 3.87–5.11)
RDW: 12.7 % (ref 11.5–15.5)
WBC: 3.9 10*3/uL — ABNORMAL LOW (ref 4.0–10.5)

## 2023-02-27 LAB — LIPID PANEL
Cholesterol: 205 mg/dL — ABNORMAL HIGH (ref 0–200)
HDL: 77.5 mg/dL (ref >39.00)
LDL Cholesterol: 112 mg/dL — ABNORMAL HIGH (ref 0–99)
NonHDL: 127.62
Total CHOL/HDL Ratio: 3
Triglycerides: 80 mg/dL (ref 0.0–149.0)
VLDL: 16 mg/dL (ref 0.0–40.0)

## 2023-02-27 LAB — TSH: TSH: 2.21 u[IU]/mL (ref 0.35–5.50)

## 2023-02-28 DIAGNOSIS — J3081 Allergic rhinitis due to animal (cat) (dog) hair and dander: Secondary | ICD-10-CM | POA: Diagnosis not present

## 2023-02-28 DIAGNOSIS — J301 Allergic rhinitis due to pollen: Secondary | ICD-10-CM | POA: Diagnosis not present

## 2023-02-28 DIAGNOSIS — J3089 Other allergic rhinitis: Secondary | ICD-10-CM | POA: Diagnosis not present

## 2023-03-04 DIAGNOSIS — H16143 Punctate keratitis, bilateral: Secondary | ICD-10-CM | POA: Diagnosis not present

## 2023-03-04 DIAGNOSIS — G43B0 Ophthalmoplegic migraine, not intractable: Secondary | ICD-10-CM | POA: Diagnosis not present

## 2023-03-04 DIAGNOSIS — H43812 Vitreous degeneration, left eye: Secondary | ICD-10-CM | POA: Diagnosis not present

## 2023-03-04 DIAGNOSIS — H11442 Conjunctival cysts, left eye: Secondary | ICD-10-CM | POA: Diagnosis not present

## 2023-03-04 DIAGNOSIS — H35361 Drusen (degenerative) of macula, right eye: Secondary | ICD-10-CM | POA: Diagnosis not present

## 2023-03-04 DIAGNOSIS — H43811 Vitreous degeneration, right eye: Secondary | ICD-10-CM | POA: Diagnosis not present

## 2023-03-04 DIAGNOSIS — H2513 Age-related nuclear cataract, bilateral: Secondary | ICD-10-CM | POA: Diagnosis not present

## 2023-03-04 DIAGNOSIS — H43393 Other vitreous opacities, bilateral: Secondary | ICD-10-CM | POA: Diagnosis not present

## 2023-03-04 DIAGNOSIS — H04123 Dry eye syndrome of bilateral lacrimal glands: Secondary | ICD-10-CM | POA: Diagnosis not present

## 2023-03-04 DIAGNOSIS — H11122 Conjunctival concretions, left eye: Secondary | ICD-10-CM | POA: Diagnosis not present

## 2023-03-04 DIAGNOSIS — H1045 Other chronic allergic conjunctivitis: Secondary | ICD-10-CM | POA: Diagnosis not present

## 2023-03-07 ENCOUNTER — Ambulatory Visit (INDEPENDENT_AMBULATORY_CARE_PROVIDER_SITE_OTHER): Payer: Medicare Other | Admitting: Internal Medicine

## 2023-03-07 ENCOUNTER — Encounter: Payer: Self-pay | Admitting: Internal Medicine

## 2023-03-07 VITALS — BP 118/78 | HR 68 | Temp 98.6°F | Ht 61.0 in | Wt 101.0 lb

## 2023-03-07 DIAGNOSIS — E538 Deficiency of other specified B group vitamins: Secondary | ICD-10-CM | POA: Diagnosis not present

## 2023-03-07 DIAGNOSIS — J301 Allergic rhinitis due to pollen: Secondary | ICD-10-CM | POA: Diagnosis not present

## 2023-03-07 DIAGNOSIS — J3081 Allergic rhinitis due to animal (cat) (dog) hair and dander: Secondary | ICD-10-CM | POA: Diagnosis not present

## 2023-03-07 DIAGNOSIS — J3089 Other allergic rhinitis: Secondary | ICD-10-CM | POA: Diagnosis not present

## 2023-03-07 DIAGNOSIS — K219 Gastro-esophageal reflux disease without esophagitis: Secondary | ICD-10-CM

## 2023-03-11 DIAGNOSIS — J301 Allergic rhinitis due to pollen: Secondary | ICD-10-CM | POA: Diagnosis not present

## 2023-03-11 DIAGNOSIS — J3089 Other allergic rhinitis: Secondary | ICD-10-CM | POA: Diagnosis not present

## 2023-03-11 DIAGNOSIS — J3081 Allergic rhinitis due to animal (cat) (dog) hair and dander: Secondary | ICD-10-CM | POA: Diagnosis not present

## 2023-03-12 ENCOUNTER — Encounter: Payer: Self-pay | Admitting: Internal Medicine

## 2023-03-12 NOTE — Telephone Encounter (Signed)
Inbound call fro patient requesting to speak with you specifically. In regards to her last OV. Please advise.   Thank you

## 2023-03-12 NOTE — Telephone Encounter (Signed)
Patient just wanted reassurance that Dr. Rhea Belton was a good choice for her upcoming hemorrhoid banding.  I assured her he would do a good job.  Patient thanked me.

## 2023-03-12 NOTE — Assessment & Plan Note (Signed)
Continue with sublingual vitamin B12

## 2023-03-12 NOTE — Assessment & Plan Note (Signed)
On Dexilant. Famotidine prn

## 2023-03-12 NOTE — Progress Notes (Signed)
Subjective:  Patient ID: [name], female    DOB: Nov 04, 1950  Age: 72 y.o. MRN: [id number]  CC: Follow-up (4 mnth f/u)   HPI [name] presents for GERD, vitamin B12 deficiency and follow-up [name] has been doing well.  Outpatient Medications Prior to Visit  Medication Sig Dispense Refill   Botulinum Toxin Type A, Cosm, 50 units SOLR Inject 4-6 Units into the muscle. 3 month  injection     Cholecalciferol (VITAMIN D3 PO) Take 50 mcg by mouth daily.     Cyanocobalamin (B-12) 1000 MCG SUBL PLACE 1 TABLET (1,000 MCG TOTAL) UNDER THE TONGUE DAILY. 100 tablet 3   DEXILANT 60 MG capsule TAKE 1 CAPSULE BY MOUTH EVERY DAY 30 capsule 11   EPIPEN 2-PAK 0.3 MG/0.3ML SOAJ injection See admin instructions. Reported on 10/08/2015  1   EUCRISA 2 % OINT Apply topically. Apply 1 to 2 times a day     famotidine (PEPCID) 20 MG tablet Take 1 tablet 1 hour before bedtime. 30 tablet 2   famotidine (PEPCID) 40 MG tablet TAKE 1 TABLET 1 HOUR BEFORE BED 90 tablet 1   linaclotide (LINZESS) 72 MCG capsule Take 1 capsule (72 mcg total) by mouth daily before breakfast. 30 capsule 5   loratadine (CLARITIN) 10 MG tablet Take 1 tablet (10 mg total) by mouth daily. (Patient taking differently: Take 10 mg by mouth daily as needed for allergies, rhinitis or itching.) 100 tablet 3   mometasone (NASONEX) 50 MCG/ACT nasal spray USE 2 SPRAYS IN EACH NOSTRIL EVERY DAY 51 each 1   PREMARIN vaginal cream Use PV as directed 30 g 5   timolol (TIMOPTIC) 0.25 % ophthalmic solution SMARTSIG:In Eye(s)     tretinoin (RETIN-A) 0.05 % cream Apply 1 application topically at bedtime.     triamcinolone ointment (KENALOG) 0.1 % Apply topically 2 (two) times daily. 160 g 1   UNABLE TO FIND once a week. Med Name: allergy shots     valACYclovir (VALTREX) 500 MG tablet Take 1 tablet (500 mg total) by mouth 2 (two) times daily. 10 tablet 2   No facility-administered medications prior to visit.    ROS: Review of Systems   Constitutional:  Negative for activity change, appetite change, chills, fatigue and unexpected weight change.  HENT:  Negative for congestion, mouth sores and sinus pressure.   Eyes:  Negative for visual disturbance.  Respiratory:  Negative for cough and chest tightness.   Gastrointestinal:  Negative for abdominal pain and nausea.  Genitourinary:  Negative for difficulty urinating, frequency and vaginal pain.  Musculoskeletal:  Negative for back pain and gait problem.  Skin:  Negative for pallor and rash.  Neurological:  Negative for dizziness, tremors, weakness, numbness and headaches.  Psychiatric/Behavioral:  Negative for confusion and sleep disturbance.     Objective:  BP 118/78 (BP Location: Right Arm, Patient Position: Sitting, Cuff Size: Large)   Pulse 68   Temp 98.6 F (37 C) (Oral)   Ht 5\' 1"  (1.549 m)   Wt 101 lb (45.8 kg)   SpO2 98%   BMI 19.08 kg/m   BP Readings from Last 3 Encounters:  03/07/23 118/78  11/16/22 120/64  11/08/22 110/68    Wt Readings from Last 3 Encounters:  03/07/23 101 lb (45.8 kg)  11/16/22 101 lb 9.6 oz (46.1 kg)  11/08/22 103 lb 12.8 oz (47.1 kg)    Physical Exam Constitutional:      General: She is not in acute distress.    Appearance:  She is well-developed.  HENT:     Head: Normocephalic.     Right Ear: External ear normal.     Left Ear: External ear normal.     Nose: Nose normal.  Eyes:     General:        Right eye: No discharge.        Left eye: No discharge.     Conjunctiva/sclera: Conjunctivae normal.     Pupils: Pupils are equal, round, and reactive to light.  Neck:     Thyroid: No thyromegaly.     Vascular: No JVD.     Trachea: No tracheal deviation.  Cardiovascular:     Rate and Rhythm: Normal rate and regular rhythm.     Heart sounds: Normal heart sounds.  Pulmonary:     Effort: No respiratory distress.     Breath sounds: No stridor. No wheezing.  Abdominal:     General: Bowel sounds are normal. There is no  distension.     Palpations: Abdomen is soft. There is no mass.     Tenderness: There is no abdominal tenderness. There is no guarding or rebound.  Musculoskeletal:        General: No tenderness.     Cervical back: Normal range of motion and neck supple. No rigidity.  Lymphadenopathy:     Cervical: No cervical adenopathy.  Skin:    Findings: No erythema or rash.  Neurological:     Cranial Nerves: No cranial nerve deficit.     Motor: No abnormal muscle tone.     Coordination: Coordination normal.     Deep Tendon Reflexes: Reflexes normal.  Psychiatric:        Behavior: Behavior normal.        Thought Content: Thought content normal.        Judgment: Judgment normal.   [name] looks much younger than her stated age  Lab Results  Component Value Date   WBC 3.9 (L) 02/27/2023   HGB 12.9 02/27/2023   HCT 39.0 02/27/2023   PLT 191.0 02/27/2023   GLUCOSE 87 02/27/2023   CHOL 205 (H) 02/27/2023   TRIG 80.0 02/27/2023   HDL 77.50 02/27/2023   LDLCALC 112 (H) 02/27/2023   ALT 14 02/27/2023   AST 18 02/27/2023   NA 140 02/27/2023   K 3.8 02/27/2023   CL 103 02/27/2023   CREATININE 0.86 02/27/2023   BUN 15 02/27/2023   CO2 31 02/27/2023   TSH 2.21 02/27/2023    No results found.  Assessment & Plan:   Problem List Items Addressed This Visit     GERD (gastroesophageal reflux disease) - Primary    On Dexilant. Famotidine prn      B12 deficiency    Continue with sublingual vitamin B12         No orders of the defined types were placed in this encounter.     Follow-up: No follow-ups on file.  Sonda Primes, MD

## 2023-03-18 DIAGNOSIS — J301 Allergic rhinitis due to pollen: Secondary | ICD-10-CM | POA: Diagnosis not present

## 2023-03-18 DIAGNOSIS — J3089 Other allergic rhinitis: Secondary | ICD-10-CM | POA: Diagnosis not present

## 2023-03-18 DIAGNOSIS — J3081 Allergic rhinitis due to animal (cat) (dog) hair and dander: Secondary | ICD-10-CM | POA: Diagnosis not present

## 2023-03-21 ENCOUNTER — Telehealth: Payer: Self-pay | Admitting: Internal Medicine

## 2023-03-21 DIAGNOSIS — Z1211 Encounter for screening for malignant neoplasm of colon: Secondary | ICD-10-CM

## 2023-03-21 NOTE — Telephone Encounter (Signed)
Patient saw Dr. Posey Rea on 03/07/23 and they discussed her doing Cologuard. The system went down the day of her appointment, but Dr. Posey Rea said he would send it. Patient would like to know if that has been done. Otherwise, she would like for it to be. Best callback is [phone].

## 2023-03-22 NOTE — Telephone Encounter (Signed)
Cologuard has been ordered. 

## 2023-03-25 ENCOUNTER — Telehealth: Payer: Self-pay | Admitting: Family Medicine

## 2023-03-25 DIAGNOSIS — J3081 Allergic rhinitis due to animal (cat) (dog) hair and dander: Secondary | ICD-10-CM | POA: Diagnosis not present

## 2023-03-25 DIAGNOSIS — J301 Allergic rhinitis due to pollen: Secondary | ICD-10-CM | POA: Diagnosis not present

## 2023-03-25 DIAGNOSIS — J3089 Other allergic rhinitis: Secondary | ICD-10-CM | POA: Diagnosis not present

## 2023-03-25 NOTE — Telephone Encounter (Signed)
Would we be able to provide the exercises that were given at her visit from 04/16/22? She is having reoccurring hip pain and thought that the exercises that were given then would be helpful at this point.  Please advise.

## 2023-03-25 NOTE — Telephone Encounter (Signed)
Called pt and left VM stating that according to the note I didn't do any HEP teaching at that visit and that perhaps she was shown some home exercises by PT. Asked for some additional information about her pain and to where she would like HEP sent or picked up. Asked that she call us back to clarify.

## 2023-04-01 ENCOUNTER — Ambulatory Visit (INDEPENDENT_AMBULATORY_CARE_PROVIDER_SITE_OTHER): Payer: Medicare Other

## 2023-04-01 DIAGNOSIS — Z23 Encounter for immunization: Secondary | ICD-10-CM

## 2023-04-01 NOTE — Progress Notes (Cosign Needed Addendum)
After obtaining consent, and per orders of Dr. Posey Rea, injection of HDF given by Ferdie Ping. Patient instructed to report any adverse reaction to me immediately.  Medical screening examination/treatment/procedure(s) were performed by non-physician practitioner and as supervising physician I was immediately available for consultation/collaboration.  I agree with above. Jacinta Shoe, MD

## 2023-04-04 ENCOUNTER — Other Ambulatory Visit: Payer: Self-pay | Admitting: Internal Medicine

## 2023-04-04 DIAGNOSIS — Z1211 Encounter for screening for malignant neoplasm of colon: Secondary | ICD-10-CM

## 2023-04-04 DIAGNOSIS — J3081 Allergic rhinitis due to animal (cat) (dog) hair and dander: Secondary | ICD-10-CM | POA: Diagnosis not present

## 2023-04-04 DIAGNOSIS — J301 Allergic rhinitis due to pollen: Secondary | ICD-10-CM | POA: Diagnosis not present

## 2023-04-04 DIAGNOSIS — J3089 Other allergic rhinitis: Secondary | ICD-10-CM | POA: Diagnosis not present

## 2023-04-08 DIAGNOSIS — Z1231 Encounter for screening mammogram for malignant neoplasm of breast: Secondary | ICD-10-CM | POA: Diagnosis not present

## 2023-04-08 LAB — HM MAMMOGRAPHY

## 2023-04-09 ENCOUNTER — Encounter: Payer: Self-pay | Admitting: Internal Medicine

## 2023-04-09 NOTE — Progress Notes (Unsigned)
[name] [address] [place] [address] Phone: [phone] Subjective:   [name], am serving as a scribe for Dr. Antoine Primas.  I'm seeing this patient by the request  of:  Plotnikov, Georgina Quint, MD  CC: Multiple complaints  [id number]  [name] is a 72 y.o. female coming in with complaint of neck pain. Last seen in March 2023 for LBP and hip pain. Patient states that she continues to be active and does not have pain with activity. When she goes to bed and wakes up she has pain. Pain occurring mostly on R side.   Who should she consult for management of bunion of B great toe?   CT of the neck in 2021 showed marked left foraminal narrowing at C3-C5.  Significant spinal stenosis at this level.  September 2023 did have a DEXA scan showing patient was osteoporotic.  Past Medical History:  Diagnosis Date   Allergic rhinitis, cause unspecified    Backache, unspecified    Benign fasciculation-cramp syndrome 12/03/2012    Worsening with  stress, fatigue .   Calculus of kidney    Conversion disorder    Cotton wool spots    Esophageal reflux    Floater, vitreous, left 05/2016   GERD (gastroesophageal reflux disease)    Hemorrhage of rectum and anus    Internal hemorrhoids without mention of complication    Irritable bowel syndrome    Microscopic hematuria    Migraine headache with aura    Mild aortic insufficiency    Mild tricuspid regurgitation    Osteopenia    Other plastic surgery for unacceptable cosmetic appearance    Inj tx fllers/expander   Personal history of urinary calculi    Postmenopausal atrophic vaginitis    Premature atrial contractions    PVC (premature ventricular contraction)    a. Event monitor 2013.   Unspecified constipation    Vasovagal syncope    Past Surgical History:  Procedure Laterality Date   BREAST BIOPSY Right    Cosmetic Procedures w/Injection therapy     SKIN CANCER EXCISION Left     thigh   WISDOM TOOTH EXTRACTION     Social History   Socioeconomic History   Marital status: Married    Spouse name: Vernia Buff   Number of children: 0   Years of education: 14   Highest education level: Not on file  Occupational History   Occupation: OFFICE liasion    Employer: [employer]   Occupation: Real Chief Technology Officer   Occupation: OFFICE LE[employer]ON    Employer: [employer]  Tobacco Use   Smoking status: Never    Passive exposure: Past   Smokeless tobacco: Never  Vaping Use   Vaping status: Never Used  Substance and Sexual Activity   Alcohol use: No   Drug use: No   Sexual activity: Yes    Birth control/protection: Post-menopausal  Other Topics Concern   Not on file  Social History Narrative   Vocational School in [place]. Married '90 - marriage is in very good shape '12, No children.  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   Patient is working Pensions consultant work.   Patient has a high school education.   Patient is right-handed.  Patient does not drink any caffeine.      Social Determinants of Health   Financial Resource Strain: Low Risk  (10/21/2022)   Overall Financial Resource Strain (CARDIA)    Difficulty of Paying Living Expenses: Not hard at all  Food Insecurity: Low Risk  (10/25/2022)   [facility]   Hunger Vital Sign    Worried About Running Out of Food in the Last Year: Never true    Ran Out of Food in the Last Year: Never true  Transportation Needs: Not on file (10/25/2022)  Physical Activity: Sufficiently Active (10/21/2022)   Exercise Vital Sign    Days of Exercise per Week: 6 days    Minutes of Exercise per Session: 60 min  Stress: No Stress Concern Present (10/21/2022)   [employer] of  Occupational Health - Occupational Stress Questionnaire    Feeling of Stress : Not at all  Social Connections: Socially Integrated (10/21/2022)   Social Connection and Isolation Panel [NHANES]    Frequency of Communication with Friends and Family: More than three times a week    Frequency of Social Gatherings with Friends and Family: Patient declined    Attends Religious Services: More than 4 times per year    Active Member of [employer] or Organizations: Yes    Attends Engineer, structural: More than 4 times per year    Marital Status: Married   Allergies  Allergen Reactions   Codeine Other (See Comments)    Doesn't remeber   Doxycycline Other (See Comments)   Epinephrine     Heart racing    Neomycin     Topical rash from cream   Penicillins Hives and Other (See Comments)    Has patient had a PCN reaction causing immediate rash, facial/tongue/throat swelling, SOB or lightheadedness with hypotension: Yes Has patient had a PCN reaction causing severe rash involving mucus membranes or skin necrosis: Yes Has patient had a PCN reaction that required hospitalization No Has patient had a PCN reaction occurring within the last 10 years: No If all of the above answers are "NO", then may proceed with Cephalosporin use.    Protonix [Pantoprazole Sodium]     ?HAs   Sulfasalazine Other (See Comments)    Doesn't remember     Doesn't remember   Sulfonamide Derivatives Other (See Comments)    Doesn't remember    Zithromax [Azithromycin] Rash   Family History  Problem Relation Age of Onset   Hyperlipidemia Mother    Hypertension Mother    Hyperlipidemia Father    COPD Father        Smoker, deceased 07-10-14   Hypertension Father    Hypertension Sister    Hyperlipidemia Sister    Headache Sister    Diabetes Neg Hx    Colon cancer Neg Hx    Breast cancer Neg Hx    Coronary artery disease Neg Hx    Esophageal cancer Neg Hx    Stomach cancer Neg Hx    Pancreatic cancer Neg Hx     Liver disease Neg Hx      Current Outpatient Medications (Cardiovascular):    EPIPEN 2-PAK 0.3 MG/0.3ML SOAJ injection, See admin instructions. Reported on 10/08/2015  Current Outpatient Medications (Respiratory):    loratadine (CLARITIN) 10 MG tablet, Take 1 tablet (10 mg total) by mouth daily. (Patient taking differently: Take 10 mg by mouth daily as needed for allergies, rhinitis or itching.)   mometasone (NASONEX) 50 MCG/ACT nasal spray, USE  2 SPRAYS IN EACH NOSTRIL EVERY DAY   Current Outpatient Medications (Hematological):    Cyanocobalamin (B-12) 1000 MCG SUBL, PLACE 1 TABLET (1,000 MCG TOTAL) UNDER THE TONGUE DAILY.  Current Outpatient Medications (Other):    Botulinum Toxin Type A, Cosm, 50 units SOLR, Inject 4-6 Units into the muscle. 3 month  injection   Cholecalciferol (VITAMIN D3 PO), Take 50 mcg by mouth daily.   DEXILANT 60 MG capsule, TAKE 1 CAPSULE BY MOUTH EVERY DAY   EUCRISA 2 % OINT, Apply topically. Apply 1 to 2 times a day   famotidine (PEPCID) 20 MG tablet, Take 1 tablet 1 hour before bedtime.   famotidine (PEPCID) 40 MG tablet, TAKE 1 TABLET 1 HOUR BEFORE BED   linaclotide (LINZESS) 72 MCG capsule, Take 1 capsule (72 mcg total) by mouth daily before breakfast.   PREMARIN vaginal cream, Use PV as directed   timolol (TIMOPTIC) 0.25 % ophthalmic solution, SMARTSIG:In Eye(s)   tretinoin (RETIN-A) 0.05 % cream, Apply 1 application topically at bedtime.   triamcinolone ointment (KENALOG) 0.1 %, Apply topically 2 (two) times daily.   UNABLE TO FIND, once a week. Med Name: allergy shots   valACYclovir (VALTREX) 500 MG tablet, Take 1 tablet (500 mg total) by mouth 2 (two) times daily.   Reviewed prior external information including notes and imaging from  primary care provider As well as notes that were available from care everywhere and other healthcare systems.  Past medical history, social, surgical and family history all reviewed in electronic medical record.  No  pertanent information unless stated regarding to the chief complaint.   Review of Systems:  No headache, visual changes, nausea, vomiting, diarrhea, constipation, dizziness, abdominal pain, skin rash, fevers, chills, night sweats, weight loss, swollen lymph nodes, body aches, joint swelling, chest pain, shortness of breath, mood changes. POSITIVE muscle aches  Objective  Blood pressure 128/76, pulse 67, height 5\' 1"  (1.549 m), weight 103 lb (46.7 kg), SpO2 100%.   General: No apparent distress alert and oriented x3 mood and affect normal, dressed appropriately.  HEENT: Pupils equal, extraocular movements intact  Respiratory: Patient's speak in full sentences and does not appear short of breath  Cardiovascular: No lower extremity edema, non tender, no erythema  Neck exam does have significant loss of lordosis.  Does lack 10 degrees of extension.  Worsening pain with flexion on the right side of the neck.  Good strength though in the upper extremities.  Does have some tightness noted in the parascapular area  Right foot does show very large bunion and bunionette formations noted.  Patient does start having early hammertoe it appears    Impression and Recommendations:    The above documentation has been reviewed and is accurate and complete Judi Saa, DO

## 2023-04-10 ENCOUNTER — Ambulatory Visit: Payer: BLUE CROSS/BLUE SHIELD | Admitting: Family Medicine

## 2023-04-10 ENCOUNTER — Ambulatory Visit (INDEPENDENT_AMBULATORY_CARE_PROVIDER_SITE_OTHER): Payer: Medicare Other | Admitting: Family Medicine

## 2023-04-10 ENCOUNTER — Encounter: Payer: Self-pay | Admitting: Family Medicine

## 2023-04-10 VITALS — BP 128/76 | HR 67 | Ht 61.0 in | Wt 103.0 lb

## 2023-04-10 DIAGNOSIS — Z1211 Encounter for screening for malignant neoplasm of colon: Secondary | ICD-10-CM | POA: Diagnosis not present

## 2023-04-10 DIAGNOSIS — M810 Age-related osteoporosis without current pathological fracture: Secondary | ICD-10-CM | POA: Diagnosis not present

## 2023-04-10 DIAGNOSIS — M21619 Bunion of unspecified foot: Secondary | ICD-10-CM

## 2023-04-10 DIAGNOSIS — M5010 Cervical disc disorder with radiculopathy, unspecified cervical region: Secondary | ICD-10-CM | POA: Diagnosis not present

## 2023-04-10 NOTE — Patient Instructions (Addendum)
Do prescribed exercises at least 3x a week PT Osteostrong Spenco total support orthotics Can always make decision on custom orthotics if needed See you again in 2-3 months

## 2023-04-10 NOTE — Assessment & Plan Note (Signed)
Significant spinal stenosis noted previous imaging.  Discussed different home exercises for strengthening noted.  Discussed avoiding significant extreme range of motion.

## 2023-04-10 NOTE — Assessment & Plan Note (Signed)
Significant bunion noted of the toe.  Has had this for quite some time.  Has talked to podiatry.  We discussed the importance of support of the transverse arch is more likely going to be beneficial.  Patient is starting to develop a hammertoe of the second toe and if worsening pain can always consider injection.  Follow-up again in 6 to 8 weeks

## 2023-04-10 NOTE — Assessment & Plan Note (Signed)
Who referred patient to discuss the possibility of posterior stroke to see if this would be beneficial.  Repeat DEXA scan in 2025.  Did discuss with patient at great length about Reclast or Prolia.

## 2023-04-11 DIAGNOSIS — J3081 Allergic rhinitis due to animal (cat) (dog) hair and dander: Secondary | ICD-10-CM | POA: Diagnosis not present

## 2023-04-11 DIAGNOSIS — J3089 Other allergic rhinitis: Secondary | ICD-10-CM | POA: Diagnosis not present

## 2023-04-11 DIAGNOSIS — J301 Allergic rhinitis due to pollen: Secondary | ICD-10-CM | POA: Diagnosis not present

## 2023-04-16 ENCOUNTER — Ambulatory Visit (INDEPENDENT_AMBULATORY_CARE_PROVIDER_SITE_OTHER): Payer: Medicare Other | Admitting: Neurology

## 2023-04-16 ENCOUNTER — Encounter: Payer: Self-pay | Admitting: Neurology

## 2023-04-16 VITALS — BP 122/70 | HR 68 | Ht 61.0 in | Wt 103.0 lb

## 2023-04-16 DIAGNOSIS — R253 Fasciculation: Secondary | ICD-10-CM

## 2023-04-16 NOTE — Progress Notes (Signed)
Provider:  Melvyn Novas, MD  Primary Care Physician:  [name] Garter, MD [address] [place] [address]     Referring Provider: Tresa Garter, Md [address] [place],  [place] [address]          Chief Complaint according to patient   Patient presents with:     New Patient (Initial Visit)           HISTORY OF PRESENT ILLNESS:  [name] is a 72 y.o. female patient who is here for revisit 04/16/2023 for  yearly routine, benign fasciculations. She reports her headaches have improved after she quit drinking diet sweetened beverages.   Chief concern according to patient :  " I have now osteoporosis, and was placed on Vit D, K and calcium- have still some headaches but much ,much less- , was asked to start using a neck support pillow". My sister has been diagnosed with MCI and aphasia. She is 5 years older, my mother is working as a Holiday representative at age 50 and very sharp, she drives. My father was sharp until he died at 53. "   We discussed if she is predisposed.        [name] is a 72 y.o. female here as a patient of  [name] for follow up on her beningn fasciculations and occipital neuralgia, vertigo-headaches. ENT Dr [name] has recommended a [facility] . RM 10, alone. Doing well from neurology standpoint. Had bone density test via PCP. Found osteoporosis in femoral neck. Has PCP appt for f/u on this early November. She considers joining Osteo -strong. She has been weight training for 20 years, and takes vit D. She may need to consider medication treatment with Dr [name].  No recent fasciculations. Migraine AURAS persist without migraine to follow. No nausea.      [name] has reported some intermittent palpitations be had intermittent palpitations discussed even in October 2018 but she feels that at this time they have been more common after her COVID vaccination with [facility] and for the last 4 months she has  been symptom-free in that regard.  As I have mentioned before she no longer takes any caffeine she does not drink alcohol and she has occasional but rather rare benign fasciculations.  She is doing very well overall and we need once a year to address any questions that come up she sometimes reports auras but she no longer has daily headaches following these.       RV 04-11-2020: CT head in February 2021 was negative. CT cervical spine showed C4-5 foraminal narrowing, bone spurs, and DDD.  Her headaches have done better. She presented me a video of her neck facilitating - benign, no ALS, as we have many times discussed before. She has noted a correlation between caffeine and fasciculation. She is active at [facility]- and she is fully vaccinated.        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  She still reports auras but no headaches after these. Rare left eye / temple headache 2 times a mont and these responds to Motrin within hours.  She has benign fasciculations. Takes cosmetic Botox and feels it helps with headches. I will arrange for Cefaly device and perhaps TMS procedure, depending on costs.     I see this patient on 04-07-2018, in a yearly follow up-  Her husband just retired this Summer, is home.  She had a visual field restriction on one day,  Occurrence on August 7th 2019 - she had seen ophthalmology on call ,followed by her regular eye specialist , [name]. Than was send for carotid doppler , which revealed wide open arteries ( [name]). On 02-28-2018 she suddenly felt a severe neck pain. She had reached for something on the floor of her car- The pain went away, but the tenderness remained over the right occipital area. She wonders about occipital neuralgia. She still has benign fasciculations. She still takes omeprazole.       Review of Systems: Out of a complete 14 system review, the patient complains of only the following symptoms, and all other  reviewed systems are negative.:  Fatigue, sleepiness.    Social History   Socioeconomic History   Marital status: Married    Spouse name: [name]   Number of children: 0   Years of education: 14   Highest education level: Not on file  Occupational History   Occupation: OFFICE liasion    Employer: [employer]   Occupation: Real Chief Technology Officer   Occupation: OFFICE LEAZON    Employer: [employer]  Tobacco Use   Smoking status: Never    Passive exposure: Past   Smokeless tobacco: Never  Vaping Use   Vaping status: Never Used  Substance and Sexual Activity   Alcohol use: No   Drug use: No   Sexual activity: Yes    Birth control/protection: Post-menopausal  Other Topics Concern   Not on file  Social History Narrative   Vocational School in [place]. Married '90 - marriage is in very good shape '12, No children.  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 Offerred medical services for them if needed (Nov '11)   Patient is married [name]) and lives at home with her husband.   Patient is working Pensions consultant work.   Patient has a high school education.   Patient is right-handed.   Patient does not drink any caffeine.      Social Determinants of Health   Financial Resource Strain: Low Risk  (10/21/2022)   Overall Financial Resource Strain (CARDIA)    Difficulty of Paying Living Expenses: Not hard at all  Food Insecurity: Low Risk  (10/25/2022)   [facility]   Hunger Vital Sign    Worried About Running Out of Food in the Last Year: Never true    Ran Out of Food in the Last Year: Never true  Transportation Needs: Not on file (10/25/2022)  Physical Activity: Sufficiently Active (10/21/2022)   Exercise Vital Sign    Days of Exercise per Week: 6 days    Minutes of Exercise per Session: 60 min  Stress: No Stress Concern Present (10/21/2022)   [employer] of Occupational Health - Occupational Stress Questionnaire    Feeling of Stress : Not at all  Social Connections: Socially Integrated (10/21/2022)   Social Connection and Isolation Panel [NHANES]    Frequency of Communication with Friends and Family: More than three times a week    Frequency of Social Gatherings with Friends and Family: Patient declined    Attends Religious Services: More than 4 times per year    Active Member of [employer] or Organizations: Yes    Attends Engineer, structural: More than 4 times per year    Marital Status: Married  Family History  Problem Relation Age of Onset   Hyperlipidemia Mother    Hypertension Mother    Hyperlipidemia Father    COPD Father        Smoker, deceased 06-17-2014   Hypertension Father    Hypertension Sister    Hyperlipidemia Sister    Headache Sister    Diabetes Neg Hx    Colon cancer Neg Hx    Breast cancer Neg Hx    Coronary artery disease Neg Hx    Esophageal cancer Neg Hx    Stomach cancer Neg Hx    Pancreatic cancer Neg Hx    Liver disease Neg Hx     Past Medical History:  Diagnosis Date   Allergic rhinitis, cause unspecified    Backache, unspecified    Benign fasciculation-cramp syndrome 12/03/2012    Worsening with  stress, fatigue .   Calculus of kidney    Conversion disorder    Cotton wool spots    Esophageal reflux    Floater, vitreous, left 17-Jun-2016   GERD (gastroesophageal reflux disease)    Hemorrhage of rectum and anus    Internal hemorrhoids without mention of complication    Irritable bowel syndrome    Microscopic hematuria    Migraine headache with aura    Mild aortic insufficiency    Mild tricuspid regurgitation    Osteopenia    Other plastic surgery for unacceptable cosmetic appearance    Inj tx fllers/expander   Personal history of urinary calculi    Postmenopausal atrophic vaginitis    Premature atrial contractions    PVC (premature ventricular contraction)    a. Event monitor  2013.   Unspecified constipation    Vasovagal syncope     Past Surgical History:  Procedure Laterality Date   BREAST BIOPSY Right    Cosmetic Procedures w/Injection therapy     SKIN CANCER EXCISION Left    thigh   WISDOM TOOTH EXTRACTION       Current Outpatient Medications on File Prior to Visit  Medication Sig Dispense Refill   Botulinum Toxin Type A, Cosm, 50 units SOLR Inject 4-6 Units into the muscle. 3 month  injection     Cyanocobalamin (B-12) 1000 MCG SUBL PLACE 1 TABLET (1,000 MCG TOTAL) UNDER THE TONGUE DAILY. 100 tablet 3   DEXILANT 60 MG capsule TAKE 1 CAPSULE BY MOUTH EVERY DAY 30 capsule 11   EPIPEN 2-PAK 0.3 MG/0.3ML SOAJ injection See admin instructions. Reported on 10/08/2015  1   EUCRISA 2 % OINT Apply topically. Apply 1 to 2 times a day     famotidine (PEPCID) 20 MG tablet Take 1 tablet 1 hour before bedtime. 30 tablet 2   famotidine (PEPCID) 40 MG tablet TAKE 1 TABLET 1 HOUR BEFORE BED 90 tablet 1   linaclotide (LINZESS) 72 MCG capsule Take 1 capsule (72 mcg total) by mouth daily before breakfast. 30 capsule 5   loratadine (CLARITIN) 10 MG tablet Take 1 tablet (10 mg total) by mouth daily. (Patient taking differently: Take 10 mg by mouth daily as needed for allergies, rhinitis or itching.) 100 tablet 3   mometasone (NASONEX) 50 MCG/ACT nasal spray USE 2 SPRAYS IN EACH NOSTRIL EVERY DAY 51 each 1   PREMARIN vaginal cream Use PV as directed 30 g 5   timolol (TIMOPTIC) 0.25 % ophthalmic solution SMARTSIG:In Eye(s)     tretinoin (RETIN-A) 0.05 % cream Apply 1 application topically at bedtime.     triamcinolone ointment (KENALOG) 0.1 % Apply topically  2 (two) times daily. 160 g 1   UNABLE TO FIND once a week. Med Name: allergy shots     valACYclovir (VALTREX) 500 MG tablet Take 1 tablet (500 mg total) by mouth 2 (two) times daily. 10 tablet 2   VITAMIN D-VITAMIN K PO Take by mouth.     No current facility-administered medications on file prior to visit.    Allergies   Allergen Reactions   Codeine Other (See Comments)    Doesn't remeber   Doxycycline Other (See Comments)   Epinephrine     Heart racing    Neomycin     Topical rash from cream   Penicillins Hives and Other (See Comments)    Has patient had a PCN reaction causing immediate rash, facial/tongue/throat swelling, SOB or lightheadedness with hypotension: Yes Has patient had a PCN reaction causing severe rash involving mucus membranes or skin necrosis: Yes Has patient had a PCN reaction that required hospitalization No Has patient had a PCN reaction occurring within the last 10 years: No If all of the above answers are "NO", then may proceed with Cephalosporin use.    Protonix [Pantoprazole Sodium]     ?HAs   Sulfasalazine Other (See Comments)    Doesn't remember     Doesn't remember   Sulfonamide Derivatives Other (See Comments)    Doesn't remember    Zithromax [Azithromycin] Rash     DIAGNOSTIC DATA (LABS, IMAGING, TESTING) - I reviewed patient records, labs, notes, testing and imaging myself where available.  Lab Results  Component Value Date   WBC 3.9 (L) 02/27/2023   HGB 12.9 02/27/2023   HCT 39.0 02/27/2023   MCV 93.1 02/27/2023   PLT 191.0 02/27/2023      Component Value Date/Time   NA 140 02/27/2023 0830   NA 145 (H) 08/04/2018 1634   K 3.8 02/27/2023 0830   CL 103 02/27/2023 0830   CO2 31 02/27/2023 0830   GLUCOSE 87 02/27/2023 0830   GLUCOSE 91 06/25/2006 0741   BUN 15 02/27/2023 0830   BUN 22 08/04/2018 1634   CREATININE 0.86 02/27/2023 0830   CREATININE 0.88 02/17/2020 0932   CALCIUM 9.8 02/27/2023 0830   PROT 7.2 02/27/2023 0830   PROT 7.2 02/15/2016 1453   ALBUMIN 4.2 02/27/2023 0830   ALBUMIN 4.7 02/15/2016 1453   AST 18 02/27/2023 0830   ALT 14 02/27/2023 0830   ALKPHOS 68 02/27/2023 0830   BILITOT 0.6 02/27/2023 0830   BILITOT 0.3 02/15/2016 1453   GFRNONAA 68 02/17/2020 0932   GFRAA 79 02/17/2020 0932   Lab Results  Component Value Date    CHOL 205 (H) 02/27/2023   HDL 77.50 02/27/2023   LDLCALC 112 (H) 02/27/2023   TRIG 80.0 02/27/2023   CHOLHDL 3 02/27/2023   No results found for: "HGBA1C" Lab Results  Component Value Date   VITAMINB12 670 04/18/2022   Lab Results  Component Value Date   TSH 2.21 02/27/2023    PHYSICAL EXAM:  Today's Vitals   04/16/23 1052  BP: 122/70  Pulse: 68  Weight: 103 lb (46.7 kg)  Height: 5\' 1"  (1.549 m)   Body mass index is 19.46 kg/m.   Wt Readings from Last 3 Encounters:  04/16/23 103 lb (46.7 kg)  04/10/23 103 lb (46.7 kg)  03/07/23 101 lb (45.8 kg)     Ht Readings from Last 3 Encounters:  04/16/23 5\' 1"  (1.549 m)  04/10/23 5\' 1"  (1.549 m)  03/07/23 5\' 1"  (1.549 m)  General: The patient is awake, alert and appears not in acute distress. The patient is well groomed. Head: Normocephalic, atraumatic. Neck is supple. Mallampati 1- wide open-  neck circumference 12.5 inches, No retrognathia. TMJ click on the right.  Cardiovascular:  Regular rate and rhythm, without  murmurs or carotid bruit, and without distended neck veins.  Respiratory: Lungs are clear to auscultation. Skin:  Without evidence of edema, or rash Trunk: BMI low normal. 19 kg/m2.    Neurologic exam : The patient is awake and alert, oriented to place and time.   Memory subjective described as intact. There is a normal attention span & concentration ability.  Speech is fluent without  dysarthria, dysphonia or aphasia.  Mood and affect are appropriate.   Cranial nerves: Pupils are equal and briskly reactive to light. Extraocular movements  in vertical and horizontal planes intact and without nystagmus. Never had nystagmus.  Hearing to finger rub intact.  Facial sensation intact to fine touch. Facial motor strength is symmetric and tongue and uvula move midline. Motor exam: Normal tone and normal muscle bulk and symmetric normal strength in all extremities. No fasciculations.  Sensory:  vibration  sensation intact in all extremities.  Coordination: Rapid alternating movements in the fingers/hands is normal.  Finger-to-nose maneuver tested and normal without evidence of ataxia, dysmetria or tremor. Gait and station: Patient walks without assistive device.  Deep tendon reflexes: in the upper and lower extremities are symmetric and intact.   ASSESSMENT AND PLAN 72 y.o. year old female  here with:    1) Benign fasciculations.  Stay low on caffeine  2) wedge pillow is a good idea for neck pain-   3) Headaches are much less frequent after dietary changes,  motrin is alright for PRN use .   I am glad you started vit D  and calcium.    I plan to follow up either personally or through our NP within 12 months.   I would like to thank Plotnikov, Georgina Quint, MD and Plotnikov, Georgina Quint, Md [address] [place],  [place] [address] for allowing me to meet with and to take care of this pleasant patient.   After spending a total time of  30  minutes face to face and additional time for physical and neurologic examination, review of laboratory studies,  personal review of imaging studies, reports and results of other testing and review of referral information / records as far as provided in visit,   Electronically signed by: [name] Novas, MD 04/16/2023 11:17 AM  [facility] Board certified by The [employer] of Sleep Medicine and Diplomate of the [employer] of Sleep Medicine. Board certified In Neurology through the ABPN, Fellow of the [employer] of Neurology.

## 2023-04-18 DIAGNOSIS — J3081 Allergic rhinitis due to animal (cat) (dog) hair and dander: Secondary | ICD-10-CM | POA: Diagnosis not present

## 2023-04-18 DIAGNOSIS — J3089 Other allergic rhinitis: Secondary | ICD-10-CM | POA: Diagnosis not present

## 2023-04-18 DIAGNOSIS — J301 Allergic rhinitis due to pollen: Secondary | ICD-10-CM | POA: Diagnosis not present

## 2023-04-22 ENCOUNTER — Telehealth: Payer: Self-pay | Admitting: Internal Medicine

## 2023-04-22 DIAGNOSIS — J301 Allergic rhinitis due to pollen: Secondary | ICD-10-CM | POA: Diagnosis not present

## 2023-04-22 DIAGNOSIS — J3089 Other allergic rhinitis: Secondary | ICD-10-CM | POA: Diagnosis not present

## 2023-04-22 DIAGNOSIS — J3081 Allergic rhinitis due to animal (cat) (dog) hair and dander: Secondary | ICD-10-CM | POA: Diagnosis not present

## 2023-04-22 NOTE — Telephone Encounter (Signed)
Inbound call from patient requesting to speak about hemorrhoid banding scheduled for tomorrow. States she has a few questions and would like to discuss what to expect. Patient requesting a call back. Please advise, thank you.

## 2023-04-22 NOTE — Telephone Encounter (Signed)
Spoke with pt and discussed procedure with her, she is also aware that Dr. Rhea Belton will discuss further with her tomorrow at her appt. Questions answered.

## 2023-04-23 ENCOUNTER — Encounter: Payer: Self-pay | Admitting: Internal Medicine

## 2023-04-23 ENCOUNTER — Ambulatory Visit (INDEPENDENT_AMBULATORY_CARE_PROVIDER_SITE_OTHER): Payer: Medicare Other | Admitting: Internal Medicine

## 2023-04-23 VITALS — BP 120/60 | HR 78 | Ht 61.0 in | Wt 103.0 lb

## 2023-04-23 DIAGNOSIS — K648 Other hemorrhoids: Secondary | ICD-10-CM | POA: Diagnosis not present

## 2023-04-23 DIAGNOSIS — Z1211 Encounter for screening for malignant neoplasm of colon: Secondary | ICD-10-CM

## 2023-04-23 DIAGNOSIS — K581 Irritable bowel syndrome with constipation: Secondary | ICD-10-CM | POA: Diagnosis not present

## 2023-04-23 NOTE — Telephone Encounter (Signed)
Inbound call from patient requesting to know if she is able to have a bowel movement before today's appointment. Patient requesting a call back. Please advise, thank you.

## 2023-04-23 NOTE — Patient Instructions (Addendum)
Continue Linzess daily.   We have scheduled your first hemorrhoid banding on 07/15/22 at 3:40pm  _______________________________________________________  If your blood pressure at your visit was 140/90 or greater, please contact your primary care physician to follow up on this.  _______________________________________________________  If you are age 70 or older, your body mass index should be between 23-30. Your Body mass index is 19.46 kg/m. If this is out of the aforementioned range listed, please consider follow up with your Primary Care Provider.  If you are age 32 or younger, your body mass index should be between 19-25. Your Body mass index is 19.46 kg/m. If this is out of the aformentioned range listed, please consider follow up with your Primary Care Provider.   ________________________________________________________  The [facility] providers would like to encourage you to use [facility] to communicate with providers for non-urgent requests or questions.  Due to long hold times on the telephone, sending your provider a message by [facility] may be a faster and more efficient way to get a response.  Please allow 48 business hours for a response.  Please remember that this is for non-urgent requests.  _______________________________________________________

## 2023-04-23 NOTE — Telephone Encounter (Signed)
Let pt know it is fine for her to have a BM prior to her banding appt.

## 2023-04-23 NOTE — Progress Notes (Signed)
Patient ID: [name], female   DOB: 1951/01/25, 72 y.o.   MRN: [id number] HPI: [name] is a 72 year old female with a past medical history of GERD, IBS with constipation predominance, internal hemorrhoids with prolapse who is here to discuss symptomatic internal hemorrhoids.  She is a patient of Dr. Yancey Flemings and was last seen by Dr. Marina Goodell on 03/14/2022.  She is here today with her husband.  She states that she has had ongoing issues with prolapsing internal hemorrhoids.  She provides a picture today which shows grade 3 rather large prolapsed internal hemorrhoids.  This occurs intermittently but on a regular basis.  She does not have bleeding.  She did have bleeding once 10 years ago.  She does have some fecal smearing.  No itching.  She does have irritable bowel with constipation predominance.  She can go 2 or 3 days without a bowel movement.  Her stools are often hard and small balls of feces.  She has a Cologuard pending but has not submitted it yet.  She also was recently prescribed Linzess but has not yet started this.  She wonders if she starts Linzess and gets her IBS with constipation under control that she will not have symptoms of hemorrhoids any longer.  Past Medical History:  Diagnosis Date   Allergic rhinitis, cause unspecified    Backache, unspecified    Benign fasciculation-cramp syndrome 12/03/2012    Worsening with  stress, fatigue .   Calculus of kidney    Conversion disorder    Cotton wool spots    Esophageal reflux    Floater, vitreous, left 05/2016   GERD (gastroesophageal reflux disease)    Hemorrhage of rectum and anus    Internal hemorrhoids without mention of complication    Irritable bowel syndrome    Microscopic hematuria    Migraine headache with aura    Mild aortic insufficiency    Mild tricuspid regurgitation    Osteopenia    Other plastic surgery for unacceptable cosmetic appearance    Inj tx fllers/expander   Personal history of urinary  calculi    Postmenopausal atrophic vaginitis    Premature atrial contractions    PVC (premature ventricular contraction)    a. Event monitor 2013.   Unspecified constipation    Vasovagal syncope     Past Surgical History:  Procedure Laterality Date   BREAST BIOPSY Right    Cosmetic Procedures w/Injection therapy     SKIN CANCER EXCISION Left    thigh   WISDOM TOOTH EXTRACTION      Outpatient Medications Prior to Visit  Medication Sig Dispense Refill   Botulinum Toxin Type A, Cosm, 50 units SOLR Inject 4-6 Units into the muscle. 3 month  injection     Cyanocobalamin (B-12) 1000 MCG SUBL PLACE 1 TABLET (1,000 MCG TOTAL) UNDER THE TONGUE DAILY. 100 tablet 3   DEXILANT 60 MG capsule TAKE 1 CAPSULE BY MOUTH EVERY DAY 30 capsule 11   EPIPEN 2-PAK 0.3 MG/0.3ML SOAJ injection See admin instructions. Reported on 10/08/2015  1   EUCRISA 2 % OINT Apply topically. Apply 1 to 2 times a day     famotidine (PEPCID) 20 MG tablet Take 1 tablet 1 hour before bedtime. 30 tablet 2   famotidine (PEPCID) 40 MG tablet TAKE 1 TABLET 1 HOUR BEFORE BED 90 tablet 1   linaclotide (LINZESS) 72 MCG capsule Take 1 capsule (72 mcg total) by mouth daily before breakfast. 30 capsule 5   loratadine (CLARITIN) 10 MG  tablet Take 1 tablet (10 mg total) by mouth daily. (Patient taking differently: Take 10 mg by mouth daily as needed for allergies, rhinitis or itching.) 100 tablet 3   mometasone (NASONEX) 50 MCG/ACT nasal spray USE 2 SPRAYS IN EACH NOSTRIL EVERY DAY 51 each 1   PREMARIN vaginal cream Use PV as directed 30 g 5   timolol (TIMOPTIC) 0.25 % ophthalmic solution SMARTSIG:In Eye(s)     tretinoin (RETIN-A) 0.05 % cream Apply 1 application topically at bedtime.     triamcinolone ointment (KENALOG) 0.1 % Apply topically 2 (two) times daily. 160 g 1   UNABLE TO FIND once a week. Med Name: allergy shots     valACYclovir (VALTREX) 500 MG tablet Take 1 tablet (500 mg total) by mouth 2 (two) times daily. 10 tablet 2    VITAMIN D-VITAMIN K PO Take by mouth.     No facility-administered medications prior to visit.    Allergies  Allergen Reactions   Codeine Other (See Comments)    Doesn't remeber   Doxycycline Other (See Comments)   Epinephrine     Heart racing    Latex     Redness and swelling    Neomycin     Topical rash from cream   Penicillins Hives and Other (See Comments)    Has patient had a PCN reaction causing immediate rash, facial/tongue/throat swelling, SOB or lightheadedness with hypotension: Yes Has patient had a PCN reaction causing severe rash involving mucus membranes or skin necrosis: Yes Has patient had a PCN reaction that required hospitalization No Has patient had a PCN reaction occurring within the last 10 years: No If all of the above answers are "NO", then may proceed with Cephalosporin use.    Protonix [Pantoprazole Sodium]     ?HAs   Sulfasalazine Other (See Comments)    Doesn't remember     Doesn't remember   Sulfonamide Derivatives Other (See Comments)    Doesn't remember    Zithromax [Azithromycin] Rash    Family History  Problem Relation Age of Onset   Hyperlipidemia Mother    Hypertension Mother    Hyperlipidemia Father    COPD Father        Smoker, deceased 2014/06/03   Hypertension Father    Hypertension Sister    Hyperlipidemia Sister    Headache Sister    Diabetes Neg Hx    Colon cancer Neg Hx    Breast cancer Neg Hx    Coronary artery disease Neg Hx    Esophageal cancer Neg Hx    Stomach cancer Neg Hx    Pancreatic cancer Neg Hx    Liver disease Neg Hx     Social History   Tobacco Use   Smoking status: Never    Passive exposure: Past   Smokeless tobacco: Never  Vaping Use   Vaping status: Never Used  Substance Use Topics   Alcohol use: No   Drug use: No    ROS: As per history of present illness, otherwise negative  BP 120/60   Pulse 78   Ht 5\' 1"  (1.549 m)   Wt 103 lb (46.7 kg)   SpO2 98%   BMI 19.46 kg/m  Gen: awake, alert,  NAD HEENT: anicteric  Neuro: nonfocal  RELEVANT LABS AND IMAGING: CBC    Component Value Date/Time   WBC 3.9 (L) 02/27/2023 0830   RBC 4.19 02/27/2023 0830   HGB 12.9 02/27/2023 0830   HCT 39.0 02/27/2023 0830   PLT 191.0  02/27/2023 0830   MCV 93.1 02/27/2023 0830   MCH 31.0 02/17/2020 0933   MCHC 33.0 02/27/2023 0830   RDW 12.7 02/27/2023 0830   LYMPHSABS 1.4 02/27/2023 0830   MONOABS 0.4 02/27/2023 0830   EOSABS 0.2 02/27/2023 0830   BASOSABS 0.1 02/27/2023 0830    CMP     Component Value Date/Time   NA 140 02/27/2023 0830   NA 145 (H) 08/04/2018 1634   K 3.8 02/27/2023 0830   CL 103 02/27/2023 0830   CO2 31 02/27/2023 0830   GLUCOSE 87 02/27/2023 0830   GLUCOSE 91 06/25/2006 0741   BUN 15 02/27/2023 0830   BUN 22 08/04/2018 1634   CREATININE 0.86 02/27/2023 0830   CREATININE 0.88 02/17/2020 0932   CALCIUM 9.8 02/27/2023 0830   PROT 7.2 02/27/2023 0830   PROT 7.2 02/15/2016 1453   ALBUMIN 4.2 02/27/2023 0830   ALBUMIN 4.7 02/15/2016 1453   AST 18 02/27/2023 0830   ALT 14 02/27/2023 0830   ALKPHOS 68 02/27/2023 0830   BILITOT 0.6 02/27/2023 0830   BILITOT 0.3 02/15/2016 1453   GFRNONAA 68 02/17/2020 0932   GFRAA 79 02/17/2020 0932    ASSESSMENT/PLAN: 72 year old female with a past medical history of GERD, IBS with constipation predominance, internal hemorrhoids with prolapse who is here to discuss symptomatic internal hemorrhoids.  Symptomatic internal hemorrhoids with prolapse/fecal smearing --grade 2-3 by picture.  Symptoms consistent with hemorrhoids.  We discussed hemorrhoidal banding today including the risk, benefits and alternatives.  She prefers to consider this further and try to use Linzess to get constipation under control.  If symptoms of hemorrhoids persist she will schedule banding in the future. -- See #2 -- Hemorrhoidal banding possibly in the future, risk and benefits and alternatives discussed today  2.  IBS with constipation predominance  --improving bowel movements with better evacuation without hard stool and the need for straining would likely improve hemorrhoids.  She has been prescribed Linzess by Dr. Posey Rea and will start this soon.  May need to have this dose titrated -- Linzess per Dr. Posey Rea  3.  Colon cancer screening --normal colonoscopy without polyps 10 years ago.  Patient has elected for Cologuard.  I encouraged her to go ahead and submit this test.  If it is positive she understands colonoscopy will be recommended with Dr.  Marina Goodell -- Patient encouraged to submit Cologuard     YQ:MVHQIONGE, Georgina Quint, Md [address] [place],  [place] [address]

## 2023-04-25 LAB — COLOGUARD: COLOGUARD: NEGATIVE

## 2023-04-29 DIAGNOSIS — J301 Allergic rhinitis due to pollen: Secondary | ICD-10-CM | POA: Diagnosis not present

## 2023-04-29 DIAGNOSIS — J3081 Allergic rhinitis due to animal (cat) (dog) hair and dander: Secondary | ICD-10-CM | POA: Diagnosis not present

## 2023-04-29 DIAGNOSIS — J3089 Other allergic rhinitis: Secondary | ICD-10-CM | POA: Diagnosis not present

## 2023-05-07 DIAGNOSIS — H6123 Impacted cerumen, bilateral: Secondary | ICD-10-CM | POA: Diagnosis not present

## 2023-05-08 ENCOUNTER — Encounter: Payer: Self-pay | Admitting: Cardiovascular Disease

## 2023-05-08 MED ORDER — DILTIAZEM HCL 30 MG PO TABS: 30.0000 mg | ORAL_TABLET | Freq: Four times a day (QID) | ORAL | 1 refills | Status: DC | PRN

## 2023-05-08 NOTE — Addendum Note (Signed)
Addended by: Lendon Ka on: 05/08/2023 05:34 PM   Modules accepted: Orders

## 2023-05-09 DIAGNOSIS — J301 Allergic rhinitis due to pollen: Secondary | ICD-10-CM | POA: Diagnosis not present

## 2023-05-09 DIAGNOSIS — J3081 Allergic rhinitis due to animal (cat) (dog) hair and dander: Secondary | ICD-10-CM | POA: Diagnosis not present

## 2023-05-09 DIAGNOSIS — J3089 Other allergic rhinitis: Secondary | ICD-10-CM | POA: Diagnosis not present

## 2023-05-10 ENCOUNTER — Encounter: Payer: Self-pay | Admitting: Internal Medicine

## 2023-05-10 ENCOUNTER — Telehealth: Payer: Self-pay | Admitting: Internal Medicine

## 2023-05-10 NOTE — Telephone Encounter (Signed)
Inbound call from patient stating she has been on linzess for 16 days and has had some relief. Stated today she had diarrhea 2 times today. Requesting a call to be advised if she should continue lizness as normal or if she should stop taking one dose a day. States she is going out of towna nd would like to be advised today. Please advise, thank you.

## 2023-05-10 NOTE — Telephone Encounter (Signed)
Pt states she had diarrhea today, 2 unexpected occurrences. She will be on a long road trip and wanted to know if she could skip tomorrows dose. Discussed with pt that she should be fine to hold tomorrows dose and then start it back. Explained that sometimes people with have diarrhea for a while but then it gets better. Pt verbalized understanding.

## 2023-05-13 ENCOUNTER — Telehealth: Payer: Self-pay | Admitting: Family Medicine

## 2023-05-13 ENCOUNTER — Other Ambulatory Visit: Payer: Self-pay

## 2023-05-13 DIAGNOSIS — M216X9 Other acquired deformities of unspecified foot: Secondary | ICD-10-CM

## 2023-05-13 NOTE — Telephone Encounter (Signed)
Patient called asking if we would be able to refer her to Dr Darrick Penna for orthotics.  She got the ones that Dr Katrinka Blazing recommended and really likes them but would also like a pair of custom ones.  Please advise.

## 2023-05-14 DIAGNOSIS — J3081 Allergic rhinitis due to animal (cat) (dog) hair and dander: Secondary | ICD-10-CM | POA: Diagnosis not present

## 2023-05-14 DIAGNOSIS — J301 Allergic rhinitis due to pollen: Secondary | ICD-10-CM | POA: Diagnosis not present

## 2023-05-14 DIAGNOSIS — J3089 Other allergic rhinitis: Secondary | ICD-10-CM | POA: Diagnosis not present

## 2023-05-23 DIAGNOSIS — L853 Xerosis cutis: Secondary | ICD-10-CM | POA: Diagnosis not present

## 2023-05-23 DIAGNOSIS — J3081 Allergic rhinitis due to animal (cat) (dog) hair and dander: Secondary | ICD-10-CM | POA: Diagnosis not present

## 2023-05-23 DIAGNOSIS — L578 Other skin changes due to chronic exposure to nonionizing radiation: Secondary | ICD-10-CM | POA: Diagnosis not present

## 2023-05-23 DIAGNOSIS — J301 Allergic rhinitis due to pollen: Secondary | ICD-10-CM | POA: Diagnosis not present

## 2023-05-23 DIAGNOSIS — J3089 Other allergic rhinitis: Secondary | ICD-10-CM | POA: Diagnosis not present

## 2023-05-27 ENCOUNTER — Encounter: Payer: Medicare Other | Admitting: Family Medicine

## 2023-05-29 ENCOUNTER — Ambulatory Visit: Payer: BLUE CROSS/BLUE SHIELD | Admitting: Internal Medicine

## 2023-05-29 DIAGNOSIS — J3089 Other allergic rhinitis: Secondary | ICD-10-CM | POA: Diagnosis not present

## 2023-06-03 ENCOUNTER — Ambulatory Visit: Payer: BLUE CROSS/BLUE SHIELD | Admitting: Internal Medicine

## 2023-06-03 ENCOUNTER — Ambulatory Visit (INDEPENDENT_AMBULATORY_CARE_PROVIDER_SITE_OTHER): Payer: Medicare Other | Admitting: Family Medicine

## 2023-06-03 VITALS — BP 110/60 | Ht 62.0 in | Wt 100.0 lb

## 2023-06-03 DIAGNOSIS — M79672 Pain in left foot: Secondary | ICD-10-CM

## 2023-06-03 DIAGNOSIS — M79671 Pain in right foot: Secondary | ICD-10-CM | POA: Diagnosis not present

## 2023-06-03 DIAGNOSIS — M216X9 Other acquired deformities of unspecified foot: Secondary | ICD-10-CM

## 2023-06-03 DIAGNOSIS — J3089 Other allergic rhinitis: Secondary | ICD-10-CM | POA: Diagnosis not present

## 2023-06-03 DIAGNOSIS — J3081 Allergic rhinitis due to animal (cat) (dog) hair and dander: Secondary | ICD-10-CM | POA: Diagnosis not present

## 2023-06-03 DIAGNOSIS — M21619 Bunion of unspecified foot: Secondary | ICD-10-CM | POA: Diagnosis not present

## 2023-06-03 DIAGNOSIS — J301 Allergic rhinitis due to pollen: Secondary | ICD-10-CM | POA: Diagnosis not present

## 2023-06-04 ENCOUNTER — Encounter: Payer: Self-pay | Admitting: Family Medicine

## 2023-06-04 NOTE — Progress Notes (Signed)
PCP: Plotnikov, Georgina Quint, MD  Subjective:   HPI: Patient is a 72 y.o. female here for custom orthotics.  Patient referred from Dr. Michaelle Copas office for custom orthotics. She has history of transverse arch collapse with prominent bunions. Using OTC inserts which have been helpful. Interested in several pairs for her different shoes. She is very physically active. No pain currently.  Past Medical History:  Diagnosis Date   Allergic rhinitis, cause unspecified    Backache, unspecified    Benign fasciculation-cramp syndrome 12/03/2012    Worsening with  stress, fatigue .   Calculus of kidney    Conversion disorder    Cotton wool spots    Esophageal reflux    Floater, vitreous, left 05/2016   GERD (gastroesophageal reflux disease)    Hemorrhage of rectum and anus    Internal hemorrhoids without mention of complication    Irritable bowel syndrome    Microscopic hematuria    Migraine headache with aura    Mild aortic insufficiency    Mild tricuspid regurgitation    Osteopenia    Other plastic surgery for unacceptable cosmetic appearance    Inj tx fllers/expander   Personal history of urinary calculi    Postmenopausal atrophic vaginitis    Premature atrial contractions    PVC (premature ventricular contraction)    a. Event monitor 2013.   Unspecified constipation    Vasovagal syncope     Current Outpatient Medications on File Prior to Visit  Medication Sig Dispense Refill   Botulinum Toxin Type A, Cosm, 50 units SOLR Inject 4-6 Units into the muscle. 3 month  injection     Cyanocobalamin (B-12) 1000 MCG SUBL PLACE 1 TABLET (1,000 MCG TOTAL) UNDER THE TONGUE DAILY. 100 tablet 3   DEXILANT 60 MG capsule TAKE 1 CAPSULE BY MOUTH EVERY DAY 30 capsule 11   diltiazem (CARDIZEM) 30 MG tablet Take 1 tablet (30 mg total) by mouth every 6 (six) hours as needed. For elevated heart rates 30 tablet 1   EPIPEN 2-PAK 0.3 MG/0.3ML SOAJ injection See admin instructions. Reported on 10/08/2015   1   EUCRISA 2 % OINT Apply topically. Apply 1 to 2 times a day     famotidine (PEPCID) 20 MG tablet Take 1 tablet 1 hour before bedtime. 30 tablet 2   famotidine (PEPCID) 40 MG tablet TAKE 1 TABLET 1 HOUR BEFORE BED 90 tablet 1   linaclotide (LINZESS) 72 MCG capsule Take 1 capsule (72 mcg total) by mouth daily before breakfast. 30 capsule 5   loratadine (CLARITIN) 10 MG tablet Take 1 tablet (10 mg total) by mouth daily. (Patient taking differently: Take 10 mg by mouth daily as needed for allergies, rhinitis or itching.) 100 tablet 3   mometasone (NASONEX) 50 MCG/ACT nasal spray USE 2 SPRAYS IN EACH NOSTRIL EVERY DAY 51 each 1   PREMARIN vaginal cream Use PV as directed 30 g 5   timolol (TIMOPTIC) 0.25 % ophthalmic solution SMARTSIG:In Eye(s)     tretinoin (RETIN-A) 0.05 % cream Apply 1 application topically at bedtime.     triamcinolone ointment (KENALOG) 0.1 % Apply topically 2 (two) times daily. 160 g 1   UNABLE TO FIND once a week. Med Name: allergy shots     valACYclovir (VALTREX) 500 MG tablet Take 1 tablet (500 mg total) by mouth 2 (two) times daily. 10 tablet 2   VITAMIN D-VITAMIN K PO Take by mouth.     No current facility-administered medications on file prior to visit.  Past Surgical History:  Procedure Laterality Date   BREAST BIOPSY Right    Cosmetic Procedures w/Injection therapy     SKIN CANCER EXCISION Left    thigh   WISDOM TOOTH EXTRACTION      Allergies  Allergen Reactions   Codeine Other (See Comments)    Doesn't remeber   Doxycycline Other (See Comments)   Epinephrine     Heart racing    Latex     Redness and swelling    Neomycin     Topical rash from cream   Penicillins Hives and Other (See Comments)    Has patient had a PCN reaction causing immediate rash, facial/tongue/throat swelling, SOB or lightheadedness with hypotension: Yes Has patient had a PCN reaction causing severe rash involving mucus membranes or skin necrosis: Yes Has patient had a PCN  reaction that required hospitalization No Has patient had a PCN reaction occurring within the last 10 years: No If all of the above answers are "NO", then may proceed with Cephalosporin use.    Protonix [Pantoprazole Sodium]     ?HAs   Sulfasalazine Other (See Comments)    Doesn't remember     Doesn't remember   Sulfonamide Derivatives Other (See Comments)    Doesn't remember    Zithromax [Azithromycin] Rash    BP 110/60   Ht 5\' 2"  (1.575 m)   Wt 100 lb (45.4 kg)   BMI 18.29 kg/m       No data to display              No data to display              Objective:  Physical Exam:  Gen: NAD, comfortable in exam room  Bilateral feet/ankles: L > R long arch collapse.  Notable transverse arch collapse with large bunions, small bunionettes.  Callus formation plantar surface over 2-4 MT heads.  Early hammering of 2nd digits. No tenderness to palpation. FROM ankles.  No hallux rigidus. ~ 3 mm leg length difference.   Assessment & Plan:  1. Bilateral foot pain - 2/2 metatarsalgia, transverse arch collapse, bunions.  Custom orthotics made today (2 pair) with medium metatarsal pads.  Discussed how to break these in, what should prompt calling us.  She wants a couple more pair so will make a follow up appointment for that.  Follow up with Dr. Katrinka Blazing otherwise for further treatment.  Patient was fitted for a : standard, cushioned, semi-rigid orthotic. The orthotic was heated and afterward the patient stood on the orthotic blank positioned on the orthotic stand. The patient was positioned in subtalar neutral position and 10 degrees of ankle dorsiflexion in a weight bearing stance. After completion of molding, a stable base was applied to the orthotic blank. The blank was ground to a stable position for weight bearing. Size: 8 fit & run (2 pair) Base: blue med density eva Posting: none Additional orthotic padding: medium metatarsal pads

## 2023-06-05 ENCOUNTER — Ambulatory Visit: Payer: BLUE CROSS/BLUE SHIELD | Admitting: Internal Medicine

## 2023-06-06 ENCOUNTER — Ambulatory Visit: Payer: BLUE CROSS/BLUE SHIELD | Admitting: Family Medicine

## 2023-06-06 DIAGNOSIS — H04131 Lacrimal cyst, right lacrimal gland: Secondary | ICD-10-CM | POA: Diagnosis not present

## 2023-06-10 ENCOUNTER — Encounter: Payer: Self-pay | Admitting: Internal Medicine

## 2023-06-13 DIAGNOSIS — J3089 Other allergic rhinitis: Secondary | ICD-10-CM | POA: Diagnosis not present

## 2023-06-13 DIAGNOSIS — J301 Allergic rhinitis due to pollen: Secondary | ICD-10-CM | POA: Diagnosis not present

## 2023-06-17 ENCOUNTER — Encounter: Payer: BLUE CROSS/BLUE SHIELD | Admitting: Family Medicine

## 2023-06-17 NOTE — Progress Notes (Signed)
 72 y.o. No obstetric history on file. Married Caucasian female here for a breast and pelvic exam.    The patient is also followed for vaginal atrophy and has used Premarin  vaginal cream through her PCP.   She stopped Premarin  in the fall.  Has tried Replens.   No itching, burning or discharge.   Asking about increasing sexual desire.  She also has oral HSV I and uses Valtrex  from her PCP.   She is not treating osteoporosis with OTC vitamins.   PCP: Plotnikov, Karlynn GAILS, MD   No LMP recorded. Patient is postmenopausal.           Sexually active: Yes.    The current method of family planning is post menopausal status.    Menopausal hormone therapy:  premarin   Exercising: Yes.     Train 5 days a week- cardio, weight lifting and zumba Smoker:  no  OB History   No obstetric history on file.     HEALTH MAINTENANCE: Last 2 paps:  01-13-19 Neg, 09-12-15 Neg, 09-11-12 Neg  History of abnormal Pap or positive HPV:  no Mammogram:  04/08/23 Breast Density Cat C, BI-RADS CAT 1 neg.  She is doing ultrasound of the breast alternating with her mammogram through her PCP.  Colonoscopy:  cologuard 2024, pt no longer needs colonoscopy  Bone Density:  02/23/22  Result  osteoporosis of hip.  Immunization History  Administered Date(s) Administered   Fluad Quad(high Dose 65+) 04/01/2019, 04/06/2020, 04/19/2021, 03/29/2022   Fluad Trivalent(High Dose 65+) 04/01/2023   Influenza Split 03/29/2011, 04/08/2012   Influenza Whole 04/09/2008, 04/12/2009, 04/10/2010   Influenza, High Dose Seasonal PF 05/06/2017, 04/14/2018, 04/16/2019, 04/21/2020   Influenza,inj,Quad PF,6+ Mos 04/08/2013, 03/22/2014, 04/08/2015, 04/09/2016, 04/08/2017   Moderna Sars-Covid-2 Vaccination 07/17/2019, 08/17/2019   Pneumococcal Conjugate-13 05/21/2018   Pneumococcal Polysaccharide-23 05/25/2019, 04/21/2020   Tdap 09/28/2015      reports that she has never smoked. She has been exposed to tobacco smoke. She has never used  smokeless tobacco. She reports that she does not drink alcohol and does not use drugs.  Past Medical History:  Diagnosis Date   Allergic rhinitis, cause unspecified    Backache, unspecified    Benign fasciculation-cramp syndrome 12/03/2012    Worsening with  stress, fatigue .   Calculus of kidney    Conversion disorder    Cotton wool spots    Esophageal reflux    Floater, vitreous, left 05/2016   GERD (gastroesophageal reflux disease)    Hemorrhage of rectum and anus    Internal hemorrhoids without mention of complication    Irritable bowel syndrome    Microscopic hematuria    Migraine headache with aura    Mild aortic insufficiency    Mild tricuspid regurgitation    Osteopenia    Other plastic surgery for unacceptable cosmetic appearance    Inj tx fllers/expander   Personal history of urinary calculi    Postmenopausal atrophic vaginitis    Premature atrial contractions    PVC (premature ventricular contraction)    a. Event monitor 2013.   Unspecified constipation    Vasovagal syncope     Past Surgical History:  Procedure Laterality Date   BREAST BIOPSY Right    Cosmetic Procedures w/Injection therapy     SKIN CANCER EXCISION Left    thigh   WISDOM TOOTH EXTRACTION      Current Outpatient Medications  Medication Sig Dispense Refill   Botulinum Toxin Type A, Cosm, 50 units SOLR Inject 4-6 Units  into the muscle. 3 month  injection     Cyanocobalamin  (B-12) 1000 MCG SUBL PLACE 1 TABLET (1,000 MCG TOTAL) UNDER THE TONGUE DAILY. 100 tablet 3   DEXILANT  60 MG capsule TAKE 1 CAPSULE BY MOUTH EVERY DAY 30 capsule 11   diltiazem  (CARDIZEM ) 30 MG tablet Take 1 tablet (30 mg total) by mouth every 6 (six) hours as needed. For elevated heart rates 30 tablet 1   EPIPEN 2-PAK 0.3 MG/0.3ML SOAJ injection See admin instructions. Reported on 10/08/2015  1   EUCRISA 2 % OINT Apply topically. Apply 1 to 2 times a day     famotidine  (PEPCID ) 20 MG tablet Take 1 tablet 1 hour before  bedtime. 30 tablet 2   famotidine  (PEPCID ) 40 MG tablet TAKE 1 TABLET 1 HOUR BEFORE BED 90 tablet 1   linaclotide  (LINZESS ) 72 MCG capsule Take 1 capsule (72 mcg total) by mouth daily before breakfast. 30 capsule 5   loratadine  (CLARITIN ) 10 MG tablet Take 1 tablet (10 mg total) by mouth daily. (Patient taking differently: Take 10 mg by mouth daily as needed for allergies, rhinitis or itching.) 100 tablet 3   mometasone  (NASONEX ) 50 MCG/ACT nasal spray USE 2 SPRAYS IN EACH NOSTRIL EVERY DAY 51 each 1   PREMARIN  vaginal cream Use PV as directed 30 g 5   timolol (TIMOPTIC) 0.25 % ophthalmic solution SMARTSIG:In Eye(s)     tretinoin  (RETIN-A ) 0.05 % cream Apply 1 application topically at bedtime.     triamcinolone  ointment (KENALOG ) 0.1 % Apply topically 2 (two) times daily. 160 g 1   UNABLE TO FIND once a week. Med Name: allergy shots     valACYclovir  (VALTREX ) 500 MG tablet Take 1 tablet (500 mg total) by mouth 2 (two) times daily. 10 tablet 2   VITAMIN D -VITAMIN K PO Take by mouth.     No current facility-administered medications for this visit.    ALLERGIES: Codeine, Doxycycline , Epinephrine, Latex, Neomycin, Penicillins, Protonix  [pantoprazole  sodium], Sulfasalazine, Sulfonamide derivatives, and Zithromax  [azithromycin ]  Family History  Problem Relation Age of Onset   Hyperlipidemia Mother    Hypertension Mother    Hyperlipidemia Father    COPD Father        Smoker, deceased 06/05/14   Hypertension Father    Hypertension Sister    Hyperlipidemia Sister    Headache Sister    Diabetes Neg Hx    Colon cancer Neg Hx    Breast cancer Neg Hx    Coronary artery disease Neg Hx    Esophageal cancer Neg Hx    Stomach cancer Neg Hx    Pancreatic cancer Neg Hx    Liver disease Neg Hx     Review of Systems  All other systems reviewed and are negative.   PHYSICAL EXAM:  BP 124/82 (BP Location: Left Arm, Patient Position: Sitting, Cuff Size: Small)   Pulse 84   Ht 5' 1.5 (1.562 m)    Wt 104 lb (47.2 kg)   SpO2 98%   BMI 19.33 kg/m     General appearance: alert, cooperative and appears stated age Head: normocephalic, without obvious abnormality, atraumatic Neck: no adenopathy, supple, symmetrical, trachea midline and thyroid  normal to inspection and palpation Lungs: clear to auscultation bilaterally Breasts: normal appearance, no masses or tenderness, No nipple retraction or dimpling, No nipple discharge or bleeding, No axillary adenopathy Heart: regular rate and rhythm Abdomen: soft, non-tender; no masses, no organomegaly Extremities: extremities normal, atraumatic, no cyanosis or edema Skin: skin color, texture,  turgor normal. No rashes or lesions Lymph nodes: cervical, supraclavicular, and axillary nodes normal. Neurologic: grossly normal  Pelvic: External genitalia:  no lesions              No abnormal inguinal nodes palpated.              Urethra:  normal appearing urethra with no masses, tenderness or lesions              Bartholins and Skenes: normal                 Vagina: normal appearing vagina with normal color and discharge, no lesions              Cervix: no lesions              Pap taken: Yes.   Bimanual Exam:  Uterus:  normal size, contour, position, consistency, mobility, non-tender              Adnexa: no mass, fullness, tenderness              Rectal exam: Yes.  .  Confirms.              Anus:  normal sphincter tone, no lesions  Chaperone was present for exam:  Damien FALCON, CMA  ASSESSMENT: Encounter for breast and pelvic exam.  Vaginal atrophy.   Encounter for medication monitoring.  Hx HSV I. Osteoporosis.   PLAN: Mammogram screening discussed. Self breast awareness reviewed. Pap and HRV collected:  yes Guidelines for Calcium, Vitamin D , regular exercise program including cardiovascular and weight bearing exercise. We discussed tx options for vaginal atrophy:  water based lubricants, cooking oils, vaginal estrace  cream in different  dosages, Intrarosa, Osphena.    Medication refills:  Estradiol  cream 0.2%, apply 1 ml to the vaginal three times a week.  Disp:  one month, RF 11. [facility]. I discussed potential effect on breast cancer.  We discussed testosterone therapy risks and benefits.  No Rx given.  Labs with PCP.  Follow up:  yearly and prn.    30 min  total time was spent for this patient encounter, including preparation, face-to-face counseling with the patient, coordination of care, and documentation of the encounter in addition to doing the breast and pelvic exam and pap.

## 2023-06-21 DIAGNOSIS — J3089 Other allergic rhinitis: Secondary | ICD-10-CM | POA: Diagnosis not present

## 2023-06-24 ENCOUNTER — Encounter: Payer: BLUE CROSS/BLUE SHIELD | Admitting: Family Medicine

## 2023-06-24 ENCOUNTER — Ambulatory Visit: Payer: Medicare Other | Admitting: Podiatry

## 2023-06-26 DIAGNOSIS — J3089 Other allergic rhinitis: Secondary | ICD-10-CM | POA: Diagnosis not present

## 2023-06-26 DIAGNOSIS — J3081 Allergic rhinitis due to animal (cat) (dog) hair and dander: Secondary | ICD-10-CM | POA: Diagnosis not present

## 2023-06-26 DIAGNOSIS — J301 Allergic rhinitis due to pollen: Secondary | ICD-10-CM | POA: Diagnosis not present

## 2023-07-01 ENCOUNTER — Other Ambulatory Visit (HOSPITAL_COMMUNITY)
Admission: RE | Admit: 2023-07-01 | Discharge: 2023-07-01 | Disposition: A | Discharge status: Home / Self Care | Payer: Medicare Other | Source: Ambulatory Visit | Attending: Obstetrics and Gynecology | Admitting: Obstetrics and Gynecology

## 2023-07-01 ENCOUNTER — Ambulatory Visit (INDEPENDENT_AMBULATORY_CARE_PROVIDER_SITE_OTHER): Payer: Medicare Other | Admitting: Obstetrics and Gynecology

## 2023-07-01 ENCOUNTER — Encounter: Payer: Self-pay | Admitting: Obstetrics and Gynecology

## 2023-07-01 VITALS — BP 124/82 | HR 84 | Ht 61.5 in | Wt 104.0 lb

## 2023-07-01 DIAGNOSIS — Z124 Encounter for screening for malignant neoplasm of cervix: Secondary | ICD-10-CM

## 2023-07-01 DIAGNOSIS — N952 Postmenopausal atrophic vaginitis: Secondary | ICD-10-CM | POA: Diagnosis not present

## 2023-07-01 DIAGNOSIS — Z9189 Other specified personal risk factors, not elsewhere classified: Secondary | ICD-10-CM

## 2023-07-01 DIAGNOSIS — Z01419 Encounter for gynecological examination (general) (routine) without abnormal findings: Secondary | ICD-10-CM

## 2023-07-01 DIAGNOSIS — Z1151 Encounter for screening for human papillomavirus (HPV): Secondary | ICD-10-CM | POA: Insufficient documentation

## 2023-07-01 DIAGNOSIS — B009 Herpesviral infection, unspecified: Secondary | ICD-10-CM

## 2023-07-01 NOTE — Patient Instructions (Signed)

## 2023-07-02 LAB — CYTOLOGY - PAP
Comment: NEGATIVE
Diagnosis: NEGATIVE
High risk HPV: NEGATIVE

## 2023-07-02 MED ORDER — NONFORMULARY OR COMPOUNDED ITEM
12 refills | Status: DC
Start: 2023-07-02 — End: 2023-12-23

## 2023-07-03 ENCOUNTER — Ambulatory Visit: Payer: Medicare Other | Admitting: Podiatry

## 2023-07-03 ENCOUNTER — Encounter: Payer: Self-pay | Admitting: Podiatry

## 2023-07-03 DIAGNOSIS — M7752 Other enthesopathy of left foot: Secondary | ICD-10-CM | POA: Diagnosis not present

## 2023-07-03 DIAGNOSIS — M7751 Other enthesopathy of right foot: Secondary | ICD-10-CM | POA: Diagnosis not present

## 2023-07-03 DIAGNOSIS — Q828 Other specified congenital malformations of skin: Secondary | ICD-10-CM

## 2023-07-03 NOTE — Progress Notes (Signed)
 Subjective:   Patient ID: [name], female   DOB: 73 y.o.   MRN: [id number]   HPI Patient presents stating that she has generalized foot pain and also has lesions underneath both feet which become painful   ROS      Objective:  Physical Exam  Neurovascular status intact muscle strength was found to be adequate range of motion adequate with the patient noted to have keratotic lesion plantar both the with nucleated cores with moderate discomfort across the MPJs     Assessment:  Inflammatory capsulitis along with lesion formation     Plan:  Sharp sterile debridement of lesion formation accomplished today and then discussed orthotics and did make her 2 pair of orthotics with pedorthist casting her properly and will have these fabricated for

## 2023-07-03 NOTE — Progress Notes (Signed)
 Orthotics   Patient was present and evaluated for Custom molded foot orthotics.patient is requesting 2 pr. Bunion deformity, Metatarsalgia present would like MT pads built in  Patient will benefit from CFO's to provide total contact to BIL MLA's helping to balance and distribute body weight more evenly across BIL feet helping to reduce plantar pressure and pain. Orthotic will also encourage FF / RF alignment  Patient was scanned today and will return for fitting upon receipt  Medicare primary with bcbs suppl. Will not cover  1st pr $542 2nd half off @ $271 total to collect is $813.00  Britton Cane Cped, CFo, CFm  Financial form signed and copy given to Pt.

## 2023-07-04 DIAGNOSIS — J3089 Other allergic rhinitis: Secondary | ICD-10-CM | POA: Diagnosis not present

## 2023-07-04 DIAGNOSIS — J3081 Allergic rhinitis due to animal (cat) (dog) hair and dander: Secondary | ICD-10-CM | POA: Diagnosis not present

## 2023-07-04 DIAGNOSIS — J301 Allergic rhinitis due to pollen: Secondary | ICD-10-CM | POA: Diagnosis not present

## 2023-07-10 ENCOUNTER — Ambulatory Visit: Payer: Medicare Other

## 2023-07-10 ENCOUNTER — Encounter: Payer: Self-pay | Admitting: Cardiovascular Disease

## 2023-07-10 VITALS — Ht 61.0 in | Wt 104.0 lb

## 2023-07-10 DIAGNOSIS — J3089 Other allergic rhinitis: Secondary | ICD-10-CM | POA: Diagnosis not present

## 2023-07-10 DIAGNOSIS — J301 Allergic rhinitis due to pollen: Secondary | ICD-10-CM | POA: Diagnosis not present

## 2023-07-10 DIAGNOSIS — Z0000 Encounter for general adult medical examination without abnormal findings: Secondary | ICD-10-CM | POA: Diagnosis not present

## 2023-07-10 DIAGNOSIS — J3081 Allergic rhinitis due to animal (cat) (dog) hair and dander: Secondary | ICD-10-CM | POA: Diagnosis not present

## 2023-07-10 NOTE — Patient Instructions (Signed)
[name] , Thank you for taking time to come for your Medicare Wellness Visit. I appreciate your ongoing commitment to your health goals. Please review the following plan we discussed and let me know if I can assist you in the future.   Referrals/Orders/Follow-Ups/Clinician Recommendations: It was nice talking to you today.  Keep up the good work.  This is a list of the screening recommended for you and due dates:  Health Maintenance  Topic Date Due   Zoster (Shingles) Vaccine (1 of 2) Never done   COVID-19 Vaccine (3 - Moderna risk series) 09/14/2019   Colon Cancer Screening  06/05/2023   Mammogram  04/07/2024   Medicare Annual Wellness Visit  07/09/2024   DTaP/Tdap/Td vaccine (2 - Td or Tdap) 09/27/2025   Pneumonia Vaccine  Completed   Flu Shot  Completed   DEXA scan (bone density measurement)  Completed   Hepatitis C Screening  Completed   HPV Vaccine  Aged Out    Advanced directives: (Copy Requested) Please bring a copy of your health care power of attorney and living will to the office to be added to your chart at your convenience.  Next Medicare Annual Wellness Visit scheduled for next year: Yes

## 2023-07-10 NOTE — Progress Notes (Addendum)
Subjective:   [name] is a 73 y.o. female who presents for Medicare Annual (Subsequent) preventive examination.  Visit Complete: Virtual I connected with  [name] on 07/10/23 by a audio enabled telemedicine application and verified that I am speaking with the correct person using two identifiers.  Patient Location: Home  Provider Location: Office/Clinic  I discussed the limitations of evaluation and management by telemedicine. The patient expressed understanding and agreed to proceed.  Vital Signs: Because this visit was a virtual/telehealth visit, some criteria may be missing or patient reported. Any vitals not documented were not able to be obtained and vitals that have been documented are patient reported.  Patient Medicare AWV questionnaire was completed by the patient on 06/19/2023; I have confirmed that all information answered by patient is correct and no changes since this date.  Cardiac Risk Factors include: advanced age (>70men, >66 women);Other (see comment), Risk factor comments: Aortic valve disorder,     Objective:    Today's Vitals   07/10/23 1547  Weight: 104 lb (47.2 kg)  Height: 5\' 1"  (1.549 m)   Body mass index is 19.65 kg/m.     07/10/2023    3:50 PM 01/10/2022    4:17 PM 01/09/2021    2:00 PM 09/12/2016    8:34 AM 07/06/2015    9:34 AM 04/18/2015   12:09 PM 08/22/2014   12:38 PM  Advanced Directives  Does Patient Have a Medical Advance Directive? Yes Yes Yes Yes Yes No No  Type of Estate agent of [place];Living will Living will;Healthcare Power of Attorney Living will;Healthcare Power of Attorney Living will Healthcare Power of [place];Living will    Does patient want to make changes to medical advance directive?  No - Patient declined No - Patient declined No - Patient declined No - Patient declined    Copy of Healthcare Power of Attorney in Chart?  No - copy requested No - copy requested  No - copy requested       Current Medications (verified) Outpatient Encounter Medications as of 07/10/2023  Medication Sig   Botulinum Toxin Type A, Cosm, 50 units SOLR Inject 4-6 Units into the muscle. 3 month  injection   Cyanocobalamin (B-12) 1000 MCG SUBL PLACE 1 TABLET (1,000 MCG TOTAL) UNDER THE TONGUE DAILY.   DEXILANT 60 MG capsule TAKE 1 CAPSULE BY MOUTH EVERY DAY   diltiazem (CARDIZEM) 30 MG tablet Take 1 tablet (30 mg total) by mouth every 6 (six) hours as needed. For elevated heart rates   EPIPEN 2-PAK 0.3 MG/0.3ML SOAJ injection See admin instructions. Reported on 10/08/2015   EUCRISA 2 % OINT Apply topically. Apply 1 to 2 times a day   famotidine (PEPCID) 20 MG tablet Take 1 tablet 1 hour before bedtime.   famotidine (PEPCID) 40 MG tablet TAKE 1 TABLET 1 HOUR BEFORE BED   linaclotide (LINZESS) 72 MCG capsule Take 1 capsule (72 mcg total) by mouth daily before breakfast.   loratadine (CLARITIN) 10 MG tablet Take 1 tablet (10 mg total) by mouth daily. (Patient taking differently: Take 10 mg by mouth daily as needed for allergies, rhinitis or itching.)   mometasone (NASONEX) 50 MCG/ACT nasal spray USE 2 SPRAYS IN EACH NOSTRIL EVERY DAY   NONFORMULARY OR COMPOUNDED ITEM Estradiol vaginal cream 0.02%, 1 ml syringe, place 1 ml per vagina at hs three times per week. Disp:  12, RF:  11.  [facility].   PREMARIN vaginal cream Use PV as directed  timolol (TIMOPTIC) 0.25 % ophthalmic solution SMARTSIG:In Eye(s)   tretinoin (RETIN-A) 0.05 % cream Apply 1 application topically at bedtime.   triamcinolone ointment (KENALOG) 0.1 % Apply topically 2 (two) times daily.   UNABLE TO FIND once a week. Med Name: allergy shots   valACYclovir (VALTREX) 500 MG tablet Take 1 tablet (500 mg total) by mouth 2 (two) times daily.   VITAMIN D-VITAMIN K PO Take by mouth.   No facility-administered encounter medications on file as of 07/10/2023.    Allergies (verified) Codeine, Doxycycline, Epinephrine, Latex, Neomycin,  Penicillins, Protonix [pantoprazole sodium], Sulfasalazine, Sulfonamide derivatives, and Zithromax [azithromycin]   History: Past Medical History:  Diagnosis Date   Allergic rhinitis, cause unspecified    Backache, unspecified    Benign fasciculation-cramp syndrome 12/03/2012    Worsening with  stress, fatigue .   Calculus of kidney    Conversion disorder    Cotton wool spots    Esophageal reflux    Floater, vitreous, left 2016/06/13   GERD (gastroesophageal reflux disease)    Hemorrhage of rectum and anus    Internal hemorrhoids without mention of complication    Irritable bowel syndrome    Microscopic hematuria    Migraine headache with aura    Mild aortic insufficiency    Mild tricuspid regurgitation    Osteopenia    Other plastic surgery for unacceptable cosmetic appearance    Inj tx fllers/expander   Personal history of urinary calculi    Postmenopausal atrophic vaginitis    Premature atrial contractions    PVC (premature ventricular contraction)    a. Event monitor 2013.   Unspecified constipation    Vasovagal syncope    Past Surgical History:  Procedure Laterality Date   BREAST BIOPSY Right    Cosmetic Procedures w/Injection therapy     SKIN CANCER EXCISION Left    thigh   WISDOM TOOTH EXTRACTION     Family History  Problem Relation Age of Onset   Hyperlipidemia Mother    Hypertension Mother    Hyperlipidemia Father    COPD Father        Smoker, deceased 06/13/2014   Hypertension Father    Hypertension Sister    Hyperlipidemia Sister    Headache Sister    Diabetes Neg Hx    Colon cancer Neg Hx    Breast cancer Neg Hx    Coronary artery disease Neg Hx    Esophageal cancer Neg Hx    Stomach cancer Neg Hx    Pancreatic cancer Neg Hx    Liver disease Neg Hx    Social History   Socioeconomic History   Marital status: Married    Spouse name: [name]   Number of children: 0   Years of education: 14   Highest education level: Not on file  Occupational History    Occupation: OFFICE liasion    Employer: [employer]   Occupation: Real Chief Technology Officer   Occupation: RETIRED/OFFICE [employer]    Employer: [employer]  Tobacco Use   Smoking status: Never    Passive exposure: Past   Smokeless tobacco: Never  Vaping Use   Vaping status: Never Used  Substance and Sexual Activity   Alcohol use: No   Drug use: No   Sexual activity: Yes    Birth control/protection: Post-menopausal    Comment: less than 5, IC after 18, no STD, no abnormal pap, no DES exposure  Other Topics Concern   Not on file  Social History Narrative  Vocational School in [place]. Married '90 - marriage is in very good shape '12, No children.  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 Offerred medical services for them if needed (Nov '11)   Patient is married [name]) and lives at home with her husband.   Patient is working Pensions consultant work.   Patient has a high school education.   Patient is right-handed.   Patient does not drink any caffeine.      Lives with husband-2025      Social Drivers of Health   Financial Resource Strain: Low Risk  (07/09/2023)   Overall Financial Resource Strain (CARDIA)    Difficulty of Paying Living Expenses: Not hard at all  Food Insecurity: No Food Insecurity (07/09/2023)   Hunger Vital Sign    Worried About Running Out of Food in the Last Year: Never true    Ran Out of Food in the Last Year: Never true  Transportation Needs: No Transportation Needs (07/09/2023)   PRAPARE - Administrator, Civil Service (Medical): No    Lack of Transportation (Non-Medical): No  Physical Activity: Sufficiently Active (07/09/2023)   Exercise Vital Sign    Days of Exercise per Week: 6 days    Minutes of Exercise per Session: 60 min  Stress: No Stress Concern Present (07/09/2023)   [employer] of Occupational Health - Occupational Stress  Questionnaire    Feeling of Stress : Not at all  Social Connections: Socially Integrated (07/09/2023)   Social Connection and Isolation Panel [NHANES]    Frequency of Communication with Friends and Family: More than three times a week    Frequency of Social Gatherings with Friends and Family: Three times a week    Attends Religious Services: More than 4 times per year    Active Member of Clubs or Organizations: Yes    Attends Banker Meetings: Patient declined    Marital Status: Married    Tobacco Counseling Counseling given: Not Answered   Clinical Intake:  Pre-visit preparation completed: Yes  Pain : No/denies pain     Diabetes: No  How often do you need to have someone help you when you read instructions, pamphlets, or other written materials from your doctor or pharmacy?: 1 - Never  Interpreter Needed?: No  Information entered by :: Jacquette Canales, RMA   Activities of Daily Living    07/09/2023    2:25 PM  In your present state of health, do you have any difficulty performing the following activities:  Hearing? 0  Vision? 0  Difficulty concentrating or making decisions? 0  Walking or climbing stairs? 0  Dressing or bathing? 0  Doing errands, shopping? 0  Preparing Food and eating ? N  Using the Toilet? N  In the past six months, have you accidently leaked urine? N  Do you have problems with loss of bowel control? N  Managing your Medications? N  Managing your Finances? N  Housekeeping or managing your Housekeeping? N    Patient Care Team: Plotnikov, Georgina Quint, MD as PCP - General (Internal Medicine) Kathleene Hazel, MD as PCP - Cardiology (Cardiology) Regan Lemming, MD as PCP - Electrophysiology (Cardiology) Dohmeier, Porfirio Mylar, MD as Consulting Physician (Neurology) Hilarie Fredrickson, MD as Consulting Physician (Gastroenterology) Fredrich Birks, OD as Referring Physician (Optometry) Sallye Lat, MD as Consulting Physician  (Ophthalmology) Ardell Isaacs, Forrestine Him, MD as Consulting Physician (  Obstetrics and Gynecology)  Indicate any recent Medical Services you may have received from other than [facility] providers in the past year (date may be approximate).     Assessment:   This is a routine wellness examination for [place].  Hearing/Vision screen Hearing Screening - Comments:: Denies hearing difficulties   Vision Screening - Comments:: Wears eyeglasses and contact   Goals Addressed               This Visit's Progress     Patient Stated (pt-stated)   On track     My goal is to continue to physically train at my level and increase my body weight.      Depression Screen    07/10/2023    3:53 PM 10/25/2022    2:35 PM 01/10/2022    4:10 PM 12/20/2021    4:37 PM 10/12/2021    1:45 PM 01/09/2021    1:59 PM 07/11/2020    9:01 AM  PHQ 2/9 Scores  PHQ - 2 Score 0 0 0 0 0 0 0  PHQ- 9 Score 0          Fall Risk    07/09/2023    2:25 PM 10/25/2022    2:35 PM 12/20/2021    4:37 PM 10/12/2021    1:44 PM 01/09/2021    2:01 PM  Fall Risk   Falls in the past year? 0 0 0 0 0  Number falls in past yr:  0 0 0 0  Injury with Fall? 0 0 0 0 0  Risk for fall due to :  No Fall Risks No Fall Risks No Fall Risks No Fall Risks  Follow up Falls evaluation completed;Falls prevention discussed Falls evaluation completed Falls evaluation completed Falls evaluation completed Falls evaluation completed    MEDICARE RISK AT HOME: Medicare Risk at Home Any stairs in or around the home?: (Patient-Rptd) Yes If so, are there any without handrails?: (Patient-Rptd) No Home free of loose throw rugs in walkways, pet beds, electrical cords, etc?: (Patient-Rptd) Yes Adequate lighting in your home to reduce risk of falls?: (Patient-Rptd) Yes Life alert?: (Patient-Rptd) No Use of a cane, walker or w/c?: (Patient-Rptd) No Grab bars in the bathroom?: (Patient-Rptd) No Shower chair or bench in shower?: (Patient-Rptd) No Elevated toilet  seat or a handicapped toilet?: (Patient-Rptd) No  TIMED UP AND GO:  Was the test performed?  No    Cognitive Function:        07/10/2023    3:51 PM 01/10/2022    4:21 PM  6CIT Screen  What Year? 0 points 0 points  What month? 0 points 0 points  What time? 0 points 0 points  Count back from 20 0 points 0 points  Months in reverse 0 points 0 points  Repeat phrase 0 points 0 points  Total Score 0 points 0 points    Immunizations Immunization History  Administered Date(s) Administered   Fluad Quad(high Dose 65+) 04/01/2019, 04/06/2020, 04/19/2021, 03/29/2022   Fluad Trivalent(High Dose 65+) 04/01/2023   Influenza Split 03/29/2011, 04/08/2012   Influenza Whole 04/09/2008, 04/12/2009, 04/10/2010   Influenza, High Dose Seasonal PF 05/06/2017, 04/14/2018, 04/16/2019, 04/21/2020   Influenza,inj,Quad PF,6+ Mos 04/08/2013, 03/22/2014, 04/08/2015, 04/09/2016, 04/08/2017   Moderna Sars-Covid-2 Vaccination 07/17/2019, 08/17/2019   Pneumococcal Conjugate-13 05/21/2018   Pneumococcal Polysaccharide-23 05/25/2019, 04/21/2020   Tdap 09/28/2015    TDAP status: Up to date  Flu Vaccine status: Up to date  Pneumococcal vaccine status: Up to date  Covid-19 vaccine status: Declined,  Education has been provided regarding the importance of this vaccine but patient still declined. Advised may receive this vaccine at local pharmacy or Health Dept.or vaccine clinic. Aware to provide a copy of the vaccination record if obtained from local pharmacy or Health Dept. Verbalized acceptance and understanding.  Qualifies for Shingles Vaccine? Yes   Zostavax completed  DECLINES   Shingrix Completed?: No.    Education has been provided regarding the importance of this vaccine. Patient has been advised to call insurance company to determine out of pocket expense if they have not yet received this vaccine. Advised may also receive vaccine at local pharmacy or Health Dept. Verbalized acceptance and  understanding.  Screening Tests Health Maintenance  Topic Date Due   Zoster Vaccines- Shingrix (1 of 2) Never done   COVID-19 Vaccine (3 - Moderna risk series) 09/14/2019   Colonoscopy  06/05/2023   MAMMOGRAM  04/07/2024   Medicare Annual Wellness (AWV)  07/09/2024   DTaP/Tdap/Td (2 - Td or Tdap) 09/27/2025   Pneumonia Vaccine 57+ Years old  Completed   INFLUENZA VACCINE  Completed   DEXA SCAN  Completed   Hepatitis C Screening  Completed   HPV VACCINES  Aged Out    Health Maintenance  Health Maintenance Due  Topic Date Due   Zoster Vaccines- Shingrix (1 of 2) Never done   COVID-19 Vaccine (3 - Moderna risk series) 09/14/2019   Colonoscopy  06/05/2023    Colorectal cancer screening: Type of screening: Cologuard. Completed 04/10/23. Repeat every 3 years  Mammogram status: Completed 04/08/2023. Repeat every year  Bone Density status: Completed 02/23/2022. Results reflect: Bone density results: OSTEOPOROSIS. Repeat every 2 years.  Lung Cancer Screening: (Low Dose CT Chest recommended if Age 47-80 years, 20 pack-year currently smoking OR have quit w/in 15years.) does not qualify.   Lung Cancer Screening Referral: N/A  Additional Screening:  Hepatitis C Screening: does qualify; Completed 10/04/2015  Vision Screening: Recommended annual ophthalmology exams for early detection of glaucoma and other disorders of the eye. Is the patient up to date with their annual eye exam?  Yes  Who is the provider or what is the name of the office in which the patient attends annual eye exams? Dr. Lorin Picket and Dr. Dione Booze If pt is not established with a provider, would they like to be referred to a provider to establish care? No .   Dental Screening: Recommended annual dental exams for proper oral hygiene   Community Resource Referral / Chronic Care Management: CRR required this visit?  No   CCM required this visit?  No     Plan:     I have personally reviewed and noted the following  in the patient's chart:   Medical and social history Use of alcohol, tobacco or illicit drugs  Current medications and supplements including opioid prescriptions. Patient is not currently taking opioid prescriptions. Functional ability and status Nutritional status Physical activity Advanced directives List of other physicians Hospitalizations, surgeries, and ER visits in previous 12 months Vitals Screenings to include cognitive, depression, and falls Referrals and appointments  In addition, I have reviewed and discussed with patient certain preventive protocols, quality metrics, and best practice recommendations. A written personalized care plan for preventive services as well as general preventive health recommendations were provided to patient.     Edilson Vital L Ramzey Petrovic, CMA   07/10/2023   After Visit Summary: (MyChart) Due to this being a telephonic visit, the after visit summary with patients personalized plan was offered to patient  via MyChart   Nurse Notes: Patient is up to date on all her health maintenance with no concerns to address today.   Medical screening examination/treatment/procedure(s) were performed by non-physician practitioner and as supervising physician I was immediately available for consultation/collaboration.  I agree with above. Jacinta Shoe, MD

## 2023-07-11 ENCOUNTER — Ambulatory Visit: Payer: Medicare Other | Admitting: Podiatry

## 2023-07-11 ENCOUNTER — Ambulatory Visit (HOSPITAL_BASED_OUTPATIENT_CLINIC_OR_DEPARTMENT_OTHER): Payer: Medicare Other

## 2023-07-11 ENCOUNTER — Ambulatory Visit: Payer: Medicare Other | Attending: Cardiovascular Disease | Admitting: Cardiovascular Disease

## 2023-07-11 ENCOUNTER — Encounter: Payer: BLUE CROSS/BLUE SHIELD | Admitting: Internal Medicine

## 2023-07-11 ENCOUNTER — Encounter: Payer: Self-pay | Admitting: Cardiovascular Disease

## 2023-07-11 VITALS — BP 120/70 | HR 72 | Ht 61.0 in | Wt 102.0 lb

## 2023-07-11 DIAGNOSIS — I471 Supraventricular tachycardia, unspecified: Secondary | ICD-10-CM | POA: Diagnosis not present

## 2023-07-11 DIAGNOSIS — I6523 Occlusion and stenosis of bilateral carotid arteries: Secondary | ICD-10-CM | POA: Diagnosis not present

## 2023-07-11 DIAGNOSIS — I351 Nonrheumatic aortic (valve) insufficiency: Secondary | ICD-10-CM | POA: Diagnosis not present

## 2023-07-11 LAB — ECHOCARDIOGRAM COMPLETE
Area-P 1/2: 5.02 cm2
Height: 61 in
P 1/2 time: 530 ms
S' Lateral: 2.35 cm
Weight: 1632 [oz_av]

## 2023-07-11 NOTE — Progress Notes (Signed)
Chief Complaint  Patient presents with   Follow-up    Aortic insufficiency SVT   History of Present Illness: 73 yo female with history of SVT, PACs, vasovagal syncope, mild aortic valve insufficiency, mild mitral regurgitation, irritable bowel syndrome and GERD who is here today for cardiac follow up. She has had palpitations for years. Cardiac monitor April 2020 showed a short run of SVT. Exercise stress test 05/07/14 with good exercise tolerance, no ischemia. Echo 08/25/18 with LVEF=55-60%, mild AI, unchanged. 30 day event monitor March 2020 with sinus rhythm with PACs and PVCs. She wished to wear a second 30 day monitor in April 2020 and this showed an episode of SVT.  She had several episodes of SVT over the summer with an episode in June 2021 and July 2021, both lasting up to 30 minutes. 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 Her reading from the company was atrial fibrillation in October but the strips looked like SVT to me. She has been seen by Dr. Raul Del in the EP clinic, last in November 2022, and has had Diltiazem and flecainide as needed. Echo March 2022 with LVEF=55-60%, mild aortic insufficiency. Cardiac calcium score arranged in primary care in May 2022 and was zero.   She is here today for follow up. The patient denies any chest pain, dyspnea, palpitations, lower extremity edema, orthopnea, PND, dizziness, near syncope or syncope.   Primary Care Physician: Tresa Garter, MD  Past Medical History:  Diagnosis Date   Allergic rhinitis, cause unspecified    Backache, unspecified    Benign fasciculation-cramp syndrome 12/03/2012    Worsening with  stress, fatigue .   Calculus of kidney    Conversion disorder    Cotton wool spots    Esophageal reflux    Floater, vitreous, left 05/2016   GERD (gastroesophageal reflux disease)    Hemorrhage of rectum and anus    Internal hemorrhoids without mention of  complication    Irritable bowel syndrome    Microscopic hematuria    Migraine headache with aura    Mild aortic insufficiency    Mild tricuspid regurgitation    Osteopenia    Other plastic surgery for unacceptable cosmetic appearance    Inj tx fllers/expander   Personal history of urinary calculi    Postmenopausal atrophic vaginitis    Premature atrial contractions    PVC (premature ventricular contraction)    a. Event monitor 2013.   Unspecified constipation    Vasovagal syncope     Past Surgical History:  Procedure Laterality Date   BREAST BIOPSY Right    Cosmetic Procedures w/Injection therapy     SKIN CANCER EXCISION Left    thigh   WISDOM TOOTH EXTRACTION      Current Outpatient Medications  Medication Sig Dispense Refill   Botulinum Toxin Type A, Cosm, 50 units SOLR Inject 4-6 Units into the muscle. 3 month  injection     Cyanocobalamin (B-12) 1000 MCG SUBL PLACE 1 TABLET (1,000 MCG TOTAL) UNDER THE TONGUE DAILY. 100 tablet 3   DEXILANT 60 MG capsule TAKE 1 CAPSULE BY MOUTH EVERY DAY 30 capsule 11   diltiazem (CARDIZEM) 30 MG tablet Take 1 tablet (30 mg total) by mouth every 6 (six) hours as needed. For elevated heart rates 30 tablet 1   EPIPEN 2-PAK 0.3 MG/0.3ML SOAJ injection See admin instructions. Reported on 10/08/2015  1   EUCRISA 2 % OINT Apply  topically. Apply 1 to 2 times a day     famotidine (PEPCID) 20 MG tablet Take 1 tablet 1 hour before bedtime. 30 tablet 2   famotidine (PEPCID) 40 MG tablet TAKE 1 TABLET 1 HOUR BEFORE BED 90 tablet 1   linaclotide (LINZESS) 72 MCG capsule Take 1 capsule (72 mcg total) by mouth daily before breakfast. 30 capsule 5   loratadine (CLARITIN) 10 MG tablet Take 1 tablet (10 mg total) by mouth daily. (Patient taking differently: Take 10 mg by mouth daily as needed for allergies, rhinitis or itching.) 100 tablet 3   mometasone (NASONEX) 50 MCG/ACT nasal spray USE 2 SPRAYS IN EACH NOSTRIL EVERY DAY 51 each 1   NONFORMULARY OR  COMPOUNDED ITEM Estradiol vaginal cream 0.02%, 1 ml syringe, place 1 ml per vagina at hs three times per week. Disp:  12, RF:  11.  [facility]. 12 each 12   PREMARIN vaginal cream Use PV as directed 30 g 5   timolol (TIMOPTIC) 0.25 % ophthalmic solution SMARTSIG:In Eye(s)     tretinoin (RETIN-A) 0.05 % cream Apply 1 application topically at bedtime.     triamcinolone ointment (KENALOG) 0.1 % Apply topically 2 (two) times daily. 160 g 1   UNABLE TO FIND once a week. Med Name: allergy shots     valACYclovir (VALTREX) 500 MG tablet Take 1 tablet (500 mg total) by mouth 2 (two) times daily. 10 tablet 2   VITAMIN D-VITAMIN K PO Take by mouth.     No current facility-administered medications for this visit.    Allergies  Allergen Reactions   Codeine Other (See Comments)    Doesn't remeber   Doxycycline Other (See Comments)   Epinephrine     Heart racing    Latex     Redness and swelling    Neomycin     Topical rash from cream   Penicillins Hives and Other (See Comments)    Has patient had a PCN reaction causing immediate rash, facial/tongue/throat swelling, SOB or lightheadedness with hypotension: Yes Has patient had a PCN reaction causing severe rash involving mucus membranes or skin necrosis: Yes Has patient had a PCN reaction that required hospitalization No Has patient had a PCN reaction occurring within the last 10 years: No If all of the above answers are "NO", then may proceed with Cephalosporin use.    Protonix [Pantoprazole Sodium]     ?HAs   Sulfasalazine Other (See Comments)    Doesn't remember     Doesn't remember   Sulfonamide Derivatives Other (See Comments)    Doesn't remember    Zithromax [Azithromycin] Rash    Social History   Socioeconomic History   Marital status: Married    Spouse name: Vernia Buff   Number of children: 0   Years of education: 14   Highest education level: Not on file  Occupational History   Occupation: OFFICE liasion    Employer:  [employer]   Occupation: Real Chief Technology Officer   Occupation: RETIRED/OFFICE LEAZON    Employer: [employer]  Tobacco Use   Smoking status: Never    Passive exposure: Past   Smokeless tobacco: Never  Vaping Use   Vaping status: Never Used  Substance and Sexual Activity   Alcohol use: No   Drug use: No   Sexual activity: Yes    Birth control/protection: Post-menopausal    Comment: less than 5, IC after 18, no STD, no abnormal pap, no DES exposure  Other Topics  Concern   Not on file  Social History Narrative   Vocational School in [place]. Married '90 - marriage is in very good shape '12, No children.  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   Patient is working Pensions consultant work.   Patient has a high school education.   Patient is right-handed.   Patient does not drink any caffeine.      Lives with husband-2025      Social Drivers of Health   Financial Resource Strain: Low Risk  (07/09/2023)   Overall Financial Resource Strain (CARDIA)    Difficulty of Paying Living Expenses: Not hard at all  Food Insecurity: No Food Insecurity (07/09/2023)   Hunger Vital Sign    Worried About Running Out of Food in the Last Year: Never true    Ran Out of Food in the Last Year: Never true  Transportation Needs: No Transportation Needs (07/09/2023)   PRAPARE - Administrator, Civil Service (Medical): No    Lack of Transportation (Non-Medical): No  Physical Activity: Sufficiently Active (07/09/2023)   Exercise Vital Sign    Days of Exercise per Week: 6 days    Minutes of Exercise per Session: 60 min  Stress: No Stress Concern Present (07/09/2023)   [employer] of Occupational Health - Occupational Stress Questionnaire    Feeling of Stress : Not at all   Social Connections: Socially Integrated (07/09/2023)   Social Connection and Isolation Panel [NHANES]    Frequency of Communication with Friends and Family: More than three times a week    Frequency of Social Gatherings with Friends and Family: Three times a week    Attends Religious Services: More than 4 times per year    Active Member of Clubs or Organizations: Yes    Attends Banker Meetings: Patient declined    Marital Status: Married  Catering manager Violence: Not At Risk (07/10/2023)   Humiliation, Afraid, Rape, and Kick questionnaire    Fear of Current or Ex-Partner: No    Emotionally Abused: No    Physically Abused: No    Sexually Abused: No    Family History  Problem Relation Age of Onset   Hyperlipidemia Mother    Hypertension Mother    Hyperlipidemia Father    COPD Father        Smoker, deceased Jun 21, 2014   Hypertension Father    Hypertension Sister    Hyperlipidemia Sister    Headache Sister    Diabetes Neg Hx    Colon cancer Neg Hx    Breast cancer Neg Hx    Coronary artery disease Neg Hx    Esophageal cancer Neg Hx    Stomach cancer Neg Hx    Pancreatic cancer Neg Hx    Liver disease Neg Hx     Review of Systems:  As stated in the HPI and otherwise negative.   BP 120/70   Pulse 72   Ht 5\' 1"  (1.549 m)   Wt 46.3 kg   SpO2 98%   BMI 19.27 kg/m   Physical Examination: General: Well developed, well nourished, NAD  HEENT: OP clear, mucus membranes moist  SKIN: warm, dry. No rashes. Neuro: No focal deficits  Musculoskeletal: Muscle strength 5/5 all ext  Psychiatric: Mood and affect  normal  Neck: No JVD, no carotid bruits, no thyromegaly, no lymphadenopathy.  Lungs:Clear bilaterally, no wheezes, rhonci, crackles Cardiovascular: Regular rate and rhythm. No murmurs, gallops or rubs. Abdomen:Soft. Bowel sounds present. Non-tender.  Extremities: No lower extremity edema. Pulses are 2 + in the bilateral DP/PT.   EKG:  EKG is ordered  today. The ekg ordered today demonstrates  EKG Interpretation Date/Time:  Thursday July 11 2023 10:18:39 EST Ventricular Rate:  68 PR Interval:  156 QRS Duration:  84 QT Interval:  370 QTC Calculation: 393 R Axis:   80  Text Interpretation: Normal sinus rhythm Normal ECG Confirmed by Verne Carrow [phone]) on 07/11/2023 10:28:10 AM   Recent Labs: 02/27/2023: ALT 14; BUN 15; Creatinine, Ser 0.86; Hemoglobin 12.9; Platelets 191.0; Potassium 3.8; Sodium 140; TSH 2.21   Lipid Panel    Component Value Date/Time   CHOL 205 (H) 02/27/2023 0830   TRIG 80.0 02/27/2023 0830   TRIG 66 06/25/2006 0741   HDL 77.50 02/27/2023 0830   CHOLHDL 3 02/27/2023 0830   VLDL 16.0 02/27/2023 0830   LDLCALC 112 (H) 02/27/2023 0830   LDLCALC 115 (H) 01/11/2020 0800     Wt Readings from Last 3 Encounters:  07/11/23 46.3 kg  07/10/23 47.2 kg  07/01/23 47.2 kg    Assessment and Plan:   1. Palpitations/PACs/PVCs/SVT:  No recent palpitations. Continue Diltiazem as needed.     2. Aortic valve insufficiency: Mild by echo March 2022. Repeat echo now.   3. Carotid artery disease: Mild disease by dopplers August 2019. Repeat dopplers now.   Labs/ tests ordered today include:   Orders Placed This Encounter  Procedures   EKG 12-Lead   ECHOCARDIOGRAM COMPLETE   VAS US CAROTID   Disposition:   F/U with me in 12  months  Signed, Verne Carrow, MD 07/11/2023 10:47 AM    [facility] [address] [place], [place], [place]  [address] Phone: [phone]; Fax: [phone]

## 2023-07-11 NOTE — Patient Instructions (Signed)
Medication Instructions:  No changes *If you need a refill on your cardiac medications before your next appointment, please call your pharmacy*   Lab Work: none   Testing/Procedures: Your physician has requested that you have an echocardiogram. Echocardiography is a painless test that uses sound waves to create images of your heart. It provides your doctor with information about the size and shape of your heart and how well your heart's chambers and valves are working. This procedure takes approximately one hour. There are no restrictions for this procedure. Please do NOT wear cologne, perfume, aftershave, or lotions (deodorant is allowed). Please arrive 15 minutes prior to your appointment time.  Please note: We ask at that you not bring children with you during ultrasound (echo/ vascular) testing. Due to room size and safety concerns, children are not allowed in the ultrasound rooms during exams. Our front office staff cannot provide observation of children in our lobby area while testing is being conducted. An adult accompanying a patient to their appointment will only be allowed in the ultrasound room at the discretion of the ultrasound technician under special circumstances. We apologize for any inconvenience.  Your physician has requested that you have a carotid duplex. This test is an ultrasound of the carotid arteries in your neck. It looks at blood flow through these arteries that supply the brain with blood. Allow one hour for this exam. There are no restrictions or special instructions.    Follow-Up: At [facility], you and your health needs are our priority.  As part of our continuing mission to provide you with exceptional heart care, we have created designated Provider Care Teams.  These Care Teams include your primary Cardiologist (physician) and Advanced Practice Providers (APPs -  Physician Assistants and Nurse Practitioners) who all work together to provide you with  the care you need, when you need it.   Your next appointment:   12 month(s)  Provider:   Verne Carrow, MD       1st Floor: - Lobby - Registration  - Pharmacy  - Lab - Cafe  2nd Floor: - PV Lab - Diagnostic Testing (echo, CT, nuclear med)  3rd Floor: - Vacant  4th Floor: - TCTS (cardiothoracic surgery) - AFib Clinic - Structural Heart Clinic - Vascular Surgery  - Vascular Ultrasound  5th Floor: - HeartCare Cardiology (general and EP) - Clinical Pharmacy for coumadin, hypertension, lipid, weight-loss medications, and med management appointments    Valet parking services will be available as well.

## 2023-07-15 DIAGNOSIS — J3081 Allergic rhinitis due to animal (cat) (dog) hair and dander: Secondary | ICD-10-CM | POA: Diagnosis not present

## 2023-07-15 DIAGNOSIS — J301 Allergic rhinitis due to pollen: Secondary | ICD-10-CM | POA: Diagnosis not present

## 2023-07-15 DIAGNOSIS — J3089 Other allergic rhinitis: Secondary | ICD-10-CM | POA: Diagnosis not present

## 2023-07-16 ENCOUNTER — Encounter: Payer: BLUE CROSS/BLUE SHIELD | Admitting: Internal Medicine

## 2023-07-19 ENCOUNTER — Encounter: Payer: Self-pay | Admitting: Obstetrics and Gynecology

## 2023-07-24 ENCOUNTER — Encounter: Payer: Self-pay | Admitting: Cardiovascular Disease

## 2023-07-25 DIAGNOSIS — J301 Allergic rhinitis due to pollen: Secondary | ICD-10-CM | POA: Diagnosis not present

## 2023-07-25 DIAGNOSIS — J3081 Allergic rhinitis due to animal (cat) (dog) hair and dander: Secondary | ICD-10-CM | POA: Diagnosis not present

## 2023-07-25 DIAGNOSIS — J3089 Other allergic rhinitis: Secondary | ICD-10-CM | POA: Diagnosis not present

## 2023-07-29 ENCOUNTER — Ambulatory Visit (HOSPITAL_COMMUNITY)
Admission: RE | Admit: 2023-07-29 | Discharge: 2023-07-29 | Disposition: A | Discharge status: Home / Self Care | Payer: Medicare Other | Source: Ambulatory Visit | Attending: Cardiology | Admitting: Cardiology

## 2023-07-29 DIAGNOSIS — I6523 Occlusion and stenosis of bilateral carotid arteries: Secondary | ICD-10-CM | POA: Diagnosis not present

## 2023-07-30 DIAGNOSIS — J301 Allergic rhinitis due to pollen: Secondary | ICD-10-CM | POA: Diagnosis not present

## 2023-07-30 DIAGNOSIS — J3089 Other allergic rhinitis: Secondary | ICD-10-CM | POA: Diagnosis not present

## 2023-07-30 DIAGNOSIS — J3081 Allergic rhinitis due to animal (cat) (dog) hair and dander: Secondary | ICD-10-CM | POA: Diagnosis not present

## 2023-08-05 DIAGNOSIS — J3081 Allergic rhinitis due to animal (cat) (dog) hair and dander: Secondary | ICD-10-CM | POA: Diagnosis not present

## 2023-08-05 DIAGNOSIS — J3089 Other allergic rhinitis: Secondary | ICD-10-CM | POA: Diagnosis not present

## 2023-08-05 DIAGNOSIS — J301 Allergic rhinitis due to pollen: Secondary | ICD-10-CM | POA: Diagnosis not present

## 2023-08-07 ENCOUNTER — Encounter: Payer: BLUE CROSS/BLUE SHIELD | Admitting: Internal Medicine

## 2023-08-08 ENCOUNTER — Telehealth: Payer: Self-pay | Admitting: Internal Medicine

## 2023-08-08 NOTE — Telephone Encounter (Signed)
Patient is requesting to speak with a nurse stating she is taking Linzess medication per her PCP but was also dicussed with Dr. Rhea Belton but she is having some side effects. Patient is requesting an alternative that was previously discussed with Dr. Rhea Belton.

## 2023-08-09 ENCOUNTER — Other Ambulatory Visit: Payer: Self-pay | Admitting: Internal Medicine

## 2023-08-09 MED ORDER — LUBIPROSTONE 8 MCG PO CAPS: 8.0000 ug | ORAL_CAPSULE | Freq: Two times a day (BID) | ORAL | 3 refills | Status: DC

## 2023-08-09 NOTE — Telephone Encounter (Signed)
If previous Cologuard was negative, then 3 years from that date.

## 2023-08-09 NOTE — Telephone Encounter (Signed)
Pt states she had a cologuard with her PCP, she states Dr. Marina Goodell told her she could do the cologuard. It was negative and pt wants to know when she should repeat. Please advise. Cologuard  was done 03/2023.

## 2023-08-09 NOTE — Telephone Encounter (Signed)
Pt states she saw Dr. Rhea Belton for hem banding. She recently started taking linzess and it worked for her but she had some side effects, dizziness, headache, and some fecal incontinence. States Dr. Rhea Belton mentioned that there were other meds she could try. She wanted to know if Dr. Rhea Belton would prescribe something else or should she contact Dr. Posey Rea. Please advise.

## 2023-08-09 NOTE — Telephone Encounter (Signed)
 Pt aware of recommendations per Dr. Rhea Belton. Script sent to pharmacy.

## 2023-08-09 NOTE — Telephone Encounter (Signed)
Yes, we will discontinue Linzess and try Amitiza 8 mcg twice daily There is a higher dose if this dose is ineffective then of course she should let us know how this medication is working and if she has any troublesome side effects It is intended to take twice daily every day

## 2023-08-09 NOTE — Telephone Encounter (Signed)
Recall entered for 10/27 for repeat cologuard as last one was negative in 10/24.

## 2023-08-12 ENCOUNTER — Encounter: Payer: Self-pay | Admitting: Internal Medicine

## 2023-08-12 ENCOUNTER — Encounter: Payer: Self-pay | Admitting: Neurology

## 2023-08-12 ENCOUNTER — Telehealth: Payer: Self-pay | Admitting: Neurology

## 2023-08-12 NOTE — Telephone Encounter (Signed)
 FYI: Pt said 2 weeks have noticed when go to pick up something it falls out of my left or right hand. Only notice the symptom when trying to pick up something light. Informed patient to see PCP  and have them send a referral for new symptom

## 2023-08-13 ENCOUNTER — Encounter: Payer: BLUE CROSS/BLUE SHIELD | Admitting: Internal Medicine

## 2023-08-14 ENCOUNTER — Other Ambulatory Visit: Payer: Self-pay | Admitting: Internal Medicine

## 2023-08-14 DIAGNOSIS — J3089 Other allergic rhinitis: Secondary | ICD-10-CM | POA: Diagnosis not present

## 2023-08-14 DIAGNOSIS — M5010 Cervical disc disorder with radiculopathy, unspecified cervical region: Secondary | ICD-10-CM

## 2023-08-14 DIAGNOSIS — J301 Allergic rhinitis due to pollen: Secondary | ICD-10-CM | POA: Diagnosis not present

## 2023-08-14 DIAGNOSIS — J3081 Allergic rhinitis due to animal (cat) (dog) hair and dander: Secondary | ICD-10-CM | POA: Diagnosis not present

## 2023-08-14 DIAGNOSIS — M5481 Occipital neuralgia: Secondary | ICD-10-CM

## 2023-08-15 ENCOUNTER — Ambulatory Visit: Payer: Medicare Other

## 2023-08-15 DIAGNOSIS — M7752 Other enthesopathy of left foot: Secondary | ICD-10-CM

## 2023-08-15 DIAGNOSIS — M2141 Flat foot [pes planus] (acquired), right foot: Secondary | ICD-10-CM

## 2023-08-15 DIAGNOSIS — M79673 Pain in unspecified foot: Secondary | ICD-10-CM

## 2023-08-15 DIAGNOSIS — M2042 Other hammer toe(s) (acquired), left foot: Secondary | ICD-10-CM

## 2023-08-15 NOTE — Progress Notes (Signed)
 Patient presents today to pick up custom molded foot orthotics, diagnosed with capsulitis by Dr. Charlsie Merles.   Orthotics were dispensed and fit was satisfactory. Reviewed instructions for break-in and wear. Written instructions given to patient.  Patient will follow up as needed.   Addison Bailey CPed, CFo, CFm 2nd pair half off @ $271 charges are added

## 2023-08-16 ENCOUNTER — Other Ambulatory Visit: Payer: Medicare Other

## 2023-08-16 ENCOUNTER — Telehealth: Payer: Self-pay

## 2023-08-16 ENCOUNTER — Other Ambulatory Visit (HOSPITAL_COMMUNITY): Payer: Self-pay

## 2023-08-16 NOTE — Telephone Encounter (Signed)
 PA submitted and approved. Please see telephone encounter 08/16/2023

## 2023-08-16 NOTE — Telephone Encounter (Signed)
 Pharmacy Patient Advocate Encounter   Received notification from CoverMyMeds that prior authorization forlubiprostone 8 mcg  is required/requested.   Insurance verification completed.   The patient is insured through [facility] .   Per test claim: PA required; PA submitted to above mentioned insurance via CoverMyMeds Key/confirmation #/EOC [id number] Status is pending .

## 2023-08-16 NOTE — Telephone Encounter (Signed)
 Pharmacy Patient Advocate Encounter  Received notification from [facility] that Prior Authorization for lubiprostone 8 mcg has been APPROVED from 06/19/2023 to 08/15/2024. Ran test claim, Copay is $175.53. This test claim was processed through [facility]- copay amounts may vary at other pharmacies due to pharmacy/plan contracts, or as the patient moves through the different stages of their insurance plan.                                                                                 Pt has deductible of approximal $600 resulting in $175.35 copay price   PA #/Case ID/Reference #: [id number]

## 2023-08-19 DIAGNOSIS — J3081 Allergic rhinitis due to animal (cat) (dog) hair and dander: Secondary | ICD-10-CM | POA: Diagnosis not present

## 2023-08-19 DIAGNOSIS — J301 Allergic rhinitis due to pollen: Secondary | ICD-10-CM | POA: Diagnosis not present

## 2023-08-19 DIAGNOSIS — J3089 Other allergic rhinitis: Secondary | ICD-10-CM | POA: Diagnosis not present

## 2023-08-19 NOTE — Telephone Encounter (Signed)
 Dr. Lelon Perla did receive a new referral for this patient for this issue, but she has not been evaluated by PCP for this, they just sent the referral. Do you want her to see PCP and be worked up for this first, or are you okay with going ahead and seeing her for this?  Thank you

## 2023-08-22 ENCOUNTER — Institutional Professional Consult (permissible substitution): Admitting: Neurology

## 2023-08-22 ENCOUNTER — Ambulatory Visit: Payer: Medicare Other | Admitting: Podiatry

## 2023-08-22 ENCOUNTER — Ambulatory Visit: Payer: Medicare Other | Admitting: Internal Medicine

## 2023-08-26 DIAGNOSIS — J3081 Allergic rhinitis due to animal (cat) (dog) hair and dander: Secondary | ICD-10-CM | POA: Diagnosis not present

## 2023-08-26 DIAGNOSIS — J3089 Other allergic rhinitis: Secondary | ICD-10-CM | POA: Diagnosis not present

## 2023-08-26 DIAGNOSIS — J301 Allergic rhinitis due to pollen: Secondary | ICD-10-CM | POA: Diagnosis not present

## 2023-08-27 ENCOUNTER — Encounter: Payer: Self-pay | Admitting: Cardiovascular Disease

## 2023-08-28 DIAGNOSIS — H04222 Epiphora due to insufficient drainage, left lacrimal gland: Secondary | ICD-10-CM | POA: Diagnosis not present

## 2023-08-28 DIAGNOSIS — H04123 Dry eye syndrome of bilateral lacrimal glands: Secondary | ICD-10-CM | POA: Diagnosis not present

## 2023-08-30 ENCOUNTER — Ambulatory Visit

## 2023-08-30 ENCOUNTER — Ambulatory Visit
Admission: RE | Admit: 2023-08-30 | Discharge: 2023-08-30 | Disposition: A | Discharge status: Home / Self Care | Source: Ambulatory Visit | Attending: Family Medicine | Admitting: Family Medicine

## 2023-08-30 VITALS — BP 144/75 | HR 86 | Temp 98.6°F | Resp 16

## 2023-08-30 DIAGNOSIS — J101 Influenza due to other identified influenza virus with other respiratory manifestations: Secondary | ICD-10-CM

## 2023-08-30 DIAGNOSIS — Z20828 Contact with and (suspected) exposure to other viral communicable diseases: Secondary | ICD-10-CM | POA: Diagnosis not present

## 2023-08-30 LAB — POC COVID19/FLU A&B COMBO
Covid Antigen, POC: NEGATIVE
Influenza A Antigen, POC: NEGATIVE
Influenza B Antigen, POC: NEGATIVE

## 2023-08-30 MED ORDER — BENZONATATE 100 MG PO CAPS
100.0000 mg | ORAL_CAPSULE | Freq: Three times a day (TID) | ORAL | 0 refills | Status: DC | PRN
Start: 2023-08-30 — End: 2023-10-10

## 2023-08-30 MED ORDER — OSELTAMIVIR PHOSPHATE 75 MG PO CAPS
75.0000 mg | ORAL_CAPSULE | Freq: Two times a day (BID) | ORAL | 0 refills | Status: DC
Start: 2023-08-30 — End: 2023-12-23

## 2023-08-30 MED ORDER — PROMETHAZINE-DM 6.25-15 MG/5ML PO SYRP: 2.5000 mL | ORAL_SOLUTION | Freq: Three times a day (TID) | ORAL | 0 refills | Status: DC | PRN

## 2023-08-30 NOTE — ED Provider Notes (Signed)
 [facility]  Note:  This document was prepared using Conservation officer, historic buildings and may include unintentional dictation errors.  MRN: [id number] DOB: Jan 28, 1951  Subjective:   [name] is a 73 y.o. female presenting for 1 day history of malaise, throat pain, coughing, chest congestion.  Patient's husband tested positive for flu yesterday.  Denies history of asthma but she does have allergic rhinitis.  No current facility-administered medications for this encounter.  Current Outpatient Medications:    Botulinum Toxin Type A, Cosm, 50 units SOLR, Inject 4-6 Units into the muscle. 3 month  injection, Disp: , Rfl:    Cyanocobalamin (B-12) 1000 MCG SUBL, PLACE 1 TABLET (1,000 MCG TOTAL) UNDER THE TONGUE DAILY., Disp: 100 tablet, Rfl: 3   DEXILANT 60 MG capsule, TAKE 1 CAPSULE BY MOUTH EVERY DAY, Disp: 30 capsule, Rfl: 11   diltiazem (CARDIZEM) 30 MG tablet, Take 1 tablet (30 mg total) by mouth every 6 (six) hours as needed. For elevated heart rates, Disp: 30 tablet, Rfl: 1   EPIPEN 2-PAK 0.3 MG/0.3ML SOAJ injection, See admin instructions. Reported on 10/08/2015, Disp: , Rfl: 1   EUCRISA 2 % OINT, Apply topically. Apply 1 to 2 times a day, Disp: , Rfl:    famotidine (PEPCID) 20 MG tablet, Take 1 tablet 1 hour before bedtime., Disp: 30 tablet, Rfl: 2   famotidine (PEPCID) 40 MG tablet, TAKE 1 TABLET 1 HOUR BEFORE BED, Disp: 90 tablet, Rfl: 1   loratadine (CLARITIN) 10 MG tablet, Take 1 tablet (10 mg total) by mouth daily. (Patient taking differently: Take 10 mg by mouth daily as needed for allergies, rhinitis or itching.), Disp: 100 tablet, Rfl: 3   lubiprostone (AMITIZA) 8 MCG capsule, TAKE 1 CAPSULE (8 MCG TOTAL) BY MOUTH 2 (TWO) TIMES DAILY WITH A MEAL., Disp: 60 capsule, Rfl: 3   mometasone (NASONEX) 50 MCG/ACT nasal spray, USE 2 SPRAYS IN EACH NOSTRIL EVERY DAY, Disp: 51 each, Rfl: 1   NONFORMULARY OR COMPOUNDED ITEM, Estradiol vaginal cream 0.02%, 1 ml  syringe, place 1 ml per vagina at hs three times per week. Disp:  12, RF:  11.  [facility]., Disp: 12 each, Rfl: 12   PREMARIN vaginal cream, Use PV as directed, Disp: 30 g, Rfl: 5   timolol (TIMOPTIC) 0.25 % ophthalmic solution, SMARTSIG:In Eye(s), Disp: , Rfl:    tretinoin (RETIN-A) 0.05 % cream, Apply 1 application topically at bedtime., Disp: , Rfl:    triamcinolone ointment (KENALOG) 0.1 %, Apply topically 2 (two) times daily., Disp: 160 g, Rfl: 1   UNABLE TO FIND, once a week. Med Name: allergy shots, Disp: , Rfl:    valACYclovir (VALTREX) 500 MG tablet, Take 1 tablet (500 mg total) by mouth 2 (two) times daily., Disp: 10 tablet, Rfl: 2   VITAMIN D-VITAMIN K PO, Take by mouth., Disp: , Rfl:    Allergies  Allergen Reactions   Codeine Other (See Comments)    Doesn't remeber   Doxycycline Other (See Comments)   Epinephrine     Heart racing    Latex     Redness and swelling    Neomycin     Topical rash from cream   Penicillins Hives and Other (See Comments)    Has patient had a PCN reaction causing immediate rash, facial/tongue/throat swelling, SOB or lightheadedness with hypotension: Yes Has patient had a PCN reaction causing severe rash involving mucus membranes or skin necrosis: Yes Has patient had a PCN reaction that  required hospitalization No Has patient had a PCN reaction occurring within the last 10 years: No If all of the above answers are "NO", then may proceed with Cephalosporin use.    Protonix [Pantoprazole Sodium]     ?HAs   Sulfasalazine Other (See Comments)    Doesn't remember     Doesn't remember   Sulfonamide Derivatives Other (See Comments)    Doesn't remember    Zithromax [Azithromycin] Rash    Past Medical History:  Diagnosis Date   Allergic rhinitis, cause unspecified    Backache, unspecified    Benign fasciculation-cramp syndrome 12/03/2012    Worsening with  stress, fatigue .   Calculus of kidney    Conversion disorder    Cotton wool  spots    Esophageal reflux    Floater, vitreous, left 2016/06/25   GERD (gastroesophageal reflux disease)    Hemorrhage of rectum and anus    Internal hemorrhoids without mention of complication    Irritable bowel syndrome    Microscopic hematuria    Migraine headache with aura    Mild aortic insufficiency    Mild tricuspid regurgitation    Osteopenia    Other plastic surgery for unacceptable cosmetic appearance    Inj tx fllers/expander   Personal history of urinary calculi    Postmenopausal atrophic vaginitis    Premature atrial contractions    PVC (premature ventricular contraction)    a. Event monitor 2013.   Unspecified constipation    Vasovagal syncope      Past Surgical History:  Procedure Laterality Date   BREAST BIOPSY Right    Cosmetic Procedures w/Injection therapy     SKIN CANCER EXCISION Left    thigh   WISDOM TOOTH EXTRACTION      Family History  Problem Relation Age of Onset   Hyperlipidemia Mother    Hypertension Mother    Hyperlipidemia Father    COPD Father        Smoker, deceased Jun 25, 2014   Hypertension Father    Hypertension Sister    Hyperlipidemia Sister    Headache Sister    Diabetes Neg Hx    Colon cancer Neg Hx    Breast cancer Neg Hx    Coronary artery disease Neg Hx    Esophageal cancer Neg Hx    Stomach cancer Neg Hx    Pancreatic cancer Neg Hx    Liver disease Neg Hx     Social History   Tobacco Use   Smoking status: Never    Passive exposure: Past   Smokeless tobacco: Never  Vaping Use   Vaping status: Never Used  Substance Use Topics   Alcohol use: No   Drug use: No    ROS   Objective:   Vitals: BP (!) 144/75 (BP Location: Left Arm)   Pulse 86   Temp 98.6 F (37 C) (Oral)   Resp 16   SpO2 97%   Physical Exam Constitutional:      General: She is not in acute distress.    Appearance: Normal appearance. She is well-developed and normal weight. She is not ill-appearing, toxic-appearing or diaphoretic.  HENT:      Head: Normocephalic and atraumatic.     Right Ear: Tympanic membrane, ear canal and external ear normal. No drainage or tenderness. No middle ear effusion. There is no impacted cerumen. Tympanic membrane is not erythematous or bulging.     Left Ear: Tympanic membrane, ear canal and external ear normal. No drainage or tenderness.  No middle ear effusion. There is no impacted cerumen. Tympanic membrane is not erythematous or bulging.     Nose: Nose normal. No congestion or rhinorrhea.     Mouth/Throat:     Mouth: Mucous membranes are moist. No oral lesions.     Pharynx: No pharyngeal swelling, oropharyngeal exudate, posterior oropharyngeal erythema or uvula swelling.     Tonsils: No tonsillar exudate or tonsillar abscesses.  Eyes:     General: No scleral icterus.       Right eye: No discharge.        Left eye: No discharge.     Extraocular Movements: Extraocular movements intact.     Right eye: Normal extraocular motion.     Left eye: Normal extraocular motion.     Conjunctiva/sclera: Conjunctivae normal.  Cardiovascular:     Rate and Rhythm: Normal rate and regular rhythm.     Heart sounds: Normal heart sounds. No murmur heard.    No friction rub. No gallop.  Pulmonary:     Effort: Pulmonary effort is normal. No respiratory distress.     Breath sounds: No stridor. No wheezing, rhonchi or rales.  Chest:     Chest wall: No tenderness.  Musculoskeletal:     Cervical back: Normal range of motion and neck supple.  Lymphadenopathy:     Cervical: No cervical adenopathy.  Skin:    General: Skin is warm and dry.  Neurological:     General: No focal deficit present.     Mental Status: She is alert and oriented to person, place, and time.  Psychiatric:        Mood and Affect: Mood normal.        Behavior: Behavior normal.     Results for orders placed or performed during the hospital encounter of 08/30/23 (from the past 24 hours)  POC Covid19/Flu A&B Antigen     Status: None    Collection Time: 08/30/23  1:28 PM  Result Value Ref Range   Influenza A Antigen, POC Negative Negative   Influenza B Antigen, POC Negative Negative   Covid Antigen, POC Negative Negative   *Note: Due to a large number of results and/or encounters for the requested time period, some results have not been displayed. A complete set of results can be found in Results Review.    Assessment and Plan :   PDMP not reviewed this encounter.  1. Influenza A   2. Exposure to influenza     I consider this a false negative test. Will cover for influenza with Tamiflu given exposure, symptom set, current incidence in the community.  Use supportive care, rest, fluids, hydration, light meals, schedule Tylenol and ibuprofen. Counseled patient on potential for adverse effects with medications prescribed today, patient verbalized understanding. ER and return-to-clinic precautions discussed, patient verbalized understanding.    Wallis Bamberg, [place] 08/30/23 [phone]

## 2023-08-30 NOTE — ED Triage Notes (Signed)
 Pt reports,  sore throat, cough, chest congestion x  1 day. Pt has not taken any meds for complaints. States husband teste positive for flu yesterday.

## 2023-08-30 NOTE — Discharge Instructions (Signed)
 Since your husband tested positive for Influenza A. We will also treat you for this with Tamiflu. For sore throat or cough try using a honey-based tea. Use 3 teaspoons of honey with juice squeezed from half lemon. Place shaved pieces of ginger into 1/2-1 cup of water and warm over stove top. Then mix the ingredients and repeat every 4 hours as needed. Please take ibuprofen 600mg  every 6 hours with food alternating with OR taken together with Tylenol 500mg -650mg  every 6 hours for throat pain, fevers, aches and pains. Hydrate very well with at least 2 liters of water. Eat light meals such as soups (chicken and noodles, vegetable, chicken and wild rice).  Do not eat foods that you are allergic to.  Taking an antihistamine like Zyrtec (10mg  daily) can help against postnasal drainage, sinus congestion which can cause sinus pain, sinus headaches, throat pain, painful swallowing, coughing.  You can take this together with pseudoephedrine (Sudafed) at a dose of 30mg  3 times a day or twice daily as needed for the same kind of nasal drip, congestion.

## 2023-09-01 ENCOUNTER — Ambulatory Visit

## 2023-09-01 ENCOUNTER — Ambulatory Visit (HOSPITAL_COMMUNITY): Payer: Self-pay

## 2023-09-01 ENCOUNTER — Emergency Department (HOSPITAL_COMMUNITY)
Admission: EM | Admit: 2023-09-01 | Discharge: 2023-09-01 | Disposition: A | Discharge status: Home / Self Care | Attending: Student | Admitting: Student

## 2023-09-01 DIAGNOSIS — Z79899 Other long term (current) drug therapy: Secondary | ICD-10-CM | POA: Insufficient documentation

## 2023-09-01 DIAGNOSIS — Z91040 Latex allergy status: Secondary | ICD-10-CM | POA: Insufficient documentation

## 2023-09-01 DIAGNOSIS — J029 Acute pharyngitis, unspecified: Secondary | ICD-10-CM | POA: Diagnosis present

## 2023-09-01 DIAGNOSIS — J101 Influenza due to other identified influenza virus with other respiratory manifestations: Secondary | ICD-10-CM | POA: Insufficient documentation

## 2023-09-01 LAB — GROUP A STREP BY PCR: Group A Strep by PCR: NOT DETECTED

## 2023-09-01 NOTE — ED Triage Notes (Signed)
 Pt states that she was exposed to the flu by her husband. Pt reports that she's also now tested positive and having a sore throat and ear pain. She is concerned about having strep d/t aortic valve insufficiency.

## 2023-09-01 NOTE — ED Provider Notes (Signed)
 [place] EMERGENCY DEPARTMENT AT [facility] Provider Note   CSN: [id number] Arrival date & time: 09/01/23  4098     History  Chief Complaint  Patient presents with   Sore Throat   Influenza    [name] is a 73 y.o. female.   Sore Throat  Influenza Presenting symptoms: sore throat   Associated symptoms: ear pain   Patient is a 73 year old female presents ED today complaining of sore throat, left ear itchiness and being influenza A+ currently taking Tamiflu.  Tested yesterday.  Taking ibuprofen for pain with relief.  Concerned that she might have strep today due to her sore throat.  Wishing to be tested today.  Max of 101.69F but managed well with ibuprofen.   Denies headache, vision changes, chest pain, shortness breath, abdominal pain, nausea, vomiting, diarrhea, lower leg swelling.  Home Medications Prior to Admission medications   Medication Sig Start Date End Date Taking? Authorizing Provider  benzonatate (TESSALON) 100 MG capsule Take 1 capsule (100 mg total) by mouth 3 (three) times daily as needed for cough. 08/30/23   Wallis Bamberg, PA-C  Botulinum Toxin Type A, Cosm, 50 units SOLR Inject 4-6 Units into the muscle. 3 month  injection    [provider]  Cyanocobalamin (B-12) 1000 MCG SUBL PLACE 1 TABLET (1,000 MCG TOTAL) UNDER THE TONGUE DAILY. 04/18/22   Plotnikov, Georgina Quint, MD  DEXILANT 60 MG capsule TAKE 1 CAPSULE BY MOUTH EVERY DAY 12/27/22   Plotnikov, Georgina Quint, MD  diltiazem (CARDIZEM) 30 MG tablet Take 1 tablet (30 mg total) by mouth every 6 (six) hours as needed. For elevated heart rates 05/08/23   Kathleene Hazel, MD  EPIPEN 2-PAK 0.3 MG/0.3ML SOAJ injection See admin instructions. Reported on 10/08/2015 06/30/14   [provider]  EUCRISA 2 % OINT Apply topically. Apply 1 to 2 times a day 09/02/20   [provider]  famotidine (PEPCID) 20 MG tablet Take 1 tablet 1 hour before bedtime. 11/19/22   Nyoka Cowden,  MD  famotidine (PEPCID) 40 MG tablet TAKE 1 TABLET 1 HOUR BEFORE BED 08/21/22   Esterwood, Amy S, PA-C  loratadine (CLARITIN) 10 MG tablet Take 1 tablet (10 mg total) by mouth daily. Patient taking differently: Take 10 mg by mouth daily as needed for allergies, rhinitis or itching. 04/18/22   Plotnikov, Georgina Quint, MD  lubiprostone (AMITIZA) 8 MCG capsule TAKE 1 CAPSULE (8 MCG TOTAL) BY MOUTH 2 (TWO) TIMES DAILY WITH A MEAL. 08/16/23   Pyrtle, Carie Caddy, MD  mometasone (NASONEX) 50 MCG/ACT nasal spray USE 2 SPRAYS IN EACH NOSTRIL EVERY DAY 11/13/22   Plotnikov, Georgina Quint, MD  NONFORMULARY OR COMPOUNDED ITEM Estradiol vaginal cream 0.02%, 1 ml syringe, place 1 ml per vagina at hs three times per week. Disp:  12, RF:  11.  [facility]. 07/02/23   Patton Salles, MD  oseltamivir (TAMIFLU) 75 MG capsule Take 1 capsule (75 mg total) by mouth 2 (two) times daily. 08/30/23   Wallis Bamberg, PA-C  PREMARIN vaginal cream Use PV as directed 01/29/23   Plotnikov, Georgina Quint, MD  promethazine-dextromethorphan (PROMETHAZINE-DM) 6.25-15 MG/5ML syrup Take 2.5 mLs by mouth 3 (three) times daily as needed for cough. 08/30/23   Wallis Bamberg, PA-C  timolol (TIMOPTIC) 0.25 % ophthalmic solution SMARTSIG:In Eye(s) 06/13/21   [provider]  tretinoin (RETIN-A) 0.05 % cream Apply 1 application topically at bedtime.    [provider]  triamcinolone ointment (  KENALOG) 0.1 % Apply topically 2 (two) times daily. 12/20/21   Plotnikov, Georgina Quint, MD  UNABLE TO FIND once a week. Med Name: allergy shots    [provider]  valACYclovir (VALTREX) 500 MG tablet Take 1 tablet (500 mg total) by mouth 2 (two) times daily. 08/28/22   Plotnikov, Georgina Quint, MD  VITAMIN D-VITAMIN K PO Take by mouth.    [provider]      Allergies    Codeine, Doxycycline, Epinephrine, Latex, Neomycin, Penicillins, Protonix [pantoprazole sodium], Sulfasalazine, Sulfonamide derivatives, and Zithromax [azithromycin]     Review of Systems   Review of Systems  HENT:  Positive for ear pain and sore throat.   All other systems reviewed and are negative.   Physical Exam Updated Vital Signs BP (!) 170/81 (BP Location: Left Arm)   Pulse (!) 106   Temp 98 F (36.7 C) (Oral)   Resp 16   SpO2 98%  Physical Exam Vitals and nursing note reviewed.  Constitutional:      General: She is not in acute distress.    Appearance: Normal appearance. She is not ill-appearing.  HENT:     Head: Normocephalic and atraumatic.  Eyes:     Extraocular Movements: Extraocular movements intact.     Conjunctiva/sclera: Conjunctivae normal.  Cardiovascular:     Rate and Rhythm: Normal rate and regular rhythm.     Pulses: Normal pulses.     Heart sounds: Normal heart sounds. No murmur heard.    No friction rub. No gallop.  Pulmonary:     Effort: Pulmonary effort is normal. No respiratory distress.     Breath sounds: Normal breath sounds.  Abdominal:     General: Abdomen is flat.     Palpations: Abdomen is soft.     Tenderness: There is no abdominal tenderness.  Skin:    General: Skin is warm and dry.  Neurological:     General: No focal deficit present.     Mental Status: She is alert. Mental status is at baseline.  Psychiatric:        Mood and Affect: Mood normal.     ED Results / Procedures / Treatments   Labs (all labs ordered are listed, but only abnormal results are displayed) Labs Reviewed  GROUP A STREP BY PCR    EKG None  Radiology No results found.  Procedures Procedures    Medications Ordered in ED Medications - No data to display  ED Course/ Medical Decision Making/ A&P                                 Medical Decision Making  Patient is a 73 year old female presents ED today complaining of sore throat, left ear itchiness and being influenza A+ currently taking Tamiflu.  Tested yesterday.  Taking ibuprofen for pain with relief.  Concerned that she might have strep today due to her  sore throat.  Wishing to be tested today.  Max of 101.63F but managed well with ibuprofen.  On physical exam, patient is noted to be afebrile, no acute distress, walking the room, speaking in full sentences, smiling.  Oropharynx showed some mild erythema in the posterior pharynx with no tonsillar swelling, uvula midline, no exudate present.  Low suspicion for PE, ACS, pneumonia, meningitis as no meningeal signs present at this time and patient is ambulatory talking in full sentences in no acute distress.  Strep test was done due  to patient request and sore throat.  Strep test was negative.  I believe patient stated discharge at this time and follow-up in outpatient setting for continued monitoring if symptoms require.  Patient vital signs have remained stable to the course of her time here.  Was noted to be tachycardic initially in triage however on exam was nontachycardic.  I believe patient safe discharge at this time.   Differential diagnoses prior to evaluation: The emergent differential diagnosis includes, but is not limited to, strep pharyngitis, mononucleosis, influenza A, URI, sinusitis, pneumonia, retropharyngeal abscess, peritonsillar abscess. This is not an exhaustive differential.   Past Medical History / Co-morbidities / Social History: HSV, aortic valve disorder, allergic rhinitis, IBS, nephrolithiasis, migraines, GERD, PVCs, GAD, mild aortic insufficiency,  Additional history: Chart reviewed. Pertinent results include:   Seen on 3/14/125 by urgent care noted to have influenza A and provided Tamiflu.  Lab Tests/Imaging studies: I personally interpreted labs/imaging and the pertinent results include:   Strep test negative    Medications: No medications required for this visit.  I have reviewed the patients home medicines and have made adjustments as needed.    Disposition: After consideration of the diagnostic results and the patients response to treatment, I feel that the  patient bit from discharge and treatment as above.   emergency department workup does not suggest an emergent condition requiring admission or immediate intervention beyond what has been performed at this time. The plan is: Continue taking Tamiflu, isolation until fever free for 24 hours, return for new or worsening symptoms, follow-up with PCP. The patient is safe for discharge and has been instructed to return immediately for worsening symptoms, change in symptoms or any other concerns.  Final Clinical Impression(s) / ED Diagnoses Final diagnoses:  Influenza A  Sore throat    Rx / DC Orders ED Discharge Orders     None         Lavonia Drafts 09/01/23 1140    Glendora Score, MD 09/01/23 2112

## 2023-09-01 NOTE — Discharge Instructions (Signed)
 You were seen today for influenza A and sore throat.  Your strep test was negative and your physical exam was also very reassuring.  I have low suspicion for pneumonia or strep pharyngitis or mononucleosis as well as any other throat based pathologies.  Your ears also look clear with no signs of infection.  I do not believe that you are requiring any antibiotics at this time as this illness is mostly due to the viral infection you are currently experiencing and would not be helped with antibiotics.  If you continue to have pain lasting greater than 10 days with continued congestion, recommend that you get seen by PCP or urgent care as at that time you may have developed a sinusitis that has developed in the presence of a viral infection.  Return to the ED if you begin to have any fever paired with increased neck pain/stiffness, vision changes, inability to tolerate food or fluids, weakness, numbness, tingling, shortness of breath, chest pain

## 2023-09-02 ENCOUNTER — Encounter: Payer: Self-pay | Admitting: Internal Medicine

## 2023-09-02 ENCOUNTER — Ambulatory Visit (INDEPENDENT_AMBULATORY_CARE_PROVIDER_SITE_OTHER): Admitting: Internal Medicine

## 2023-09-02 ENCOUNTER — Ambulatory Visit (HOSPITAL_COMMUNITY)

## 2023-09-02 VITALS — BP 110/70 | HR 75 | Temp 98.2°F | Ht 61.0 in | Wt 102.0 lb

## 2023-09-02 DIAGNOSIS — R002 Palpitations: Secondary | ICD-10-CM | POA: Diagnosis not present

## 2023-09-02 DIAGNOSIS — R051 Acute cough: Secondary | ICD-10-CM

## 2023-09-02 DIAGNOSIS — J069 Acute upper respiratory infection, unspecified: Secondary | ICD-10-CM | POA: Diagnosis not present

## 2023-09-02 MED ORDER — HYDROCODONE BIT-HOMATROP MBR 5-1.5 MG/5ML PO SOLN: 5.0000 mL | ORAL | 0 refills | Status: DC | PRN

## 2023-09-02 MED ORDER — CEFDINIR 300 MG PO CAPS: 300.0000 mg | ORAL_CAPSULE | Freq: Two times a day (BID) | ORAL | 0 refills | Status: DC

## 2023-09-02 NOTE — Assessment & Plan Note (Addendum)
 H/o palpitations triggered by COVID-vaccine. Doing ok

## 2023-09-02 NOTE — Assessment & Plan Note (Signed)
 URI. Hycodan prn  Potential benefits of a short term opioids use as well as potential risks (i.e. addiction risk, apnea etc) and complications (i.e. Somnolence, constipation and others) were explained to the patient and were aknowledged.

## 2023-09-02 NOTE — Assessment & Plan Note (Signed)
 Cefdinir Hycodan prn

## 2023-09-02 NOTE — Progress Notes (Signed)
 Subjective:  Patient ID: [name], female    DOB: Jul 01, 1950  Age: 73 y.o. MRN: [id number]  CC: Cough (Pt states she has a cough and sore throat x4 days)   HPI [name] presents for Influenza A (+) test - pt took Tamiflu C/o severe ST x 1 week:  strep test was (-) Green nasal d/c  Outpatient Medications Prior to Visit  Medication Sig Dispense Refill   Botulinum Toxin Type A, Cosm, 50 units SOLR Inject 4-6 Units into the muscle. 3 month  injection     Cyanocobalamin (B-12) 1000 MCG SUBL PLACE 1 TABLET (1,000 MCG TOTAL) UNDER THE TONGUE DAILY. 100 tablet 3   DEXILANT 60 MG capsule TAKE 1 CAPSULE BY MOUTH EVERY DAY 30 capsule 11   diltiazem (CARDIZEM) 30 MG tablet Take 1 tablet (30 mg total) by mouth every 6 (six) hours as needed. For elevated heart rates 30 tablet 1   EPIPEN 2-PAK 0.3 MG/0.3ML SOAJ injection See admin instructions. Reported on 10/08/2015  1   EUCRISA 2 % OINT Apply topically. Apply 1 to 2 times a day     famotidine (PEPCID) 20 MG tablet Take 1 tablet 1 hour before bedtime. 30 tablet 2   famotidine (PEPCID) 40 MG tablet TAKE 1 TABLET 1 HOUR BEFORE BED 90 tablet 1   loratadine (CLARITIN) 10 MG tablet Take 1 tablet (10 mg total) by mouth daily. (Patient taking differently: Take 10 mg by mouth daily as needed for allergies, rhinitis or itching.) 100 tablet 3   lubiprostone (AMITIZA) 8 MCG capsule TAKE 1 CAPSULE (8 MCG TOTAL) BY MOUTH 2 (TWO) TIMES DAILY WITH A MEAL. 60 capsule 3   mometasone (NASONEX) 50 MCG/ACT nasal spray USE 2 SPRAYS IN EACH NOSTRIL EVERY DAY 51 each 1   NONFORMULARY OR COMPOUNDED ITEM Estradiol vaginal cream 0.02%, 1 ml syringe, place 1 ml per vagina at hs three times per week. Disp:  12, RF:  11.  [facility]. 12 each 12   oseltamivir (TAMIFLU) 75 MG capsule Take 1 capsule (75 mg total) by mouth 2 (two) times daily. 10 capsule 0   PREMARIN vaginal cream Use PV as directed 30 g 5   timolol (TIMOPTIC) 0.25 % ophthalmic solution  SMARTSIG:In Eye(s)     tretinoin (RETIN-A) 0.05 % cream Apply 1 application topically at bedtime.     triamcinolone ointment (KENALOG) 0.1 % Apply topically 2 (two) times daily. 160 g 1   UNABLE TO FIND once a week. Med Name: allergy shots     valACYclovir (VALTREX) 500 MG tablet Take 1 tablet (500 mg total) by mouth 2 (two) times daily. 10 tablet 2   VITAMIN D-VITAMIN K PO Take by mouth.     promethazine-dextromethorphan (PROMETHAZINE-DM) 6.25-15 MG/5ML syrup Take 2.5 mLs by mouth 3 (three) times daily as needed for cough. 100 mL 0   benzonatate (TESSALON) 100 MG capsule Take 1 capsule (100 mg total) by mouth 3 (three) times daily as needed for cough. (Patient not taking: Reported on 09/02/2023) 30 capsule 0   No facility-administered medications prior to visit.    ROS: Review of Systems  Constitutional:  Positive for fatigue. Negative for activity change, appetite change, chills and unexpected weight change.  HENT:  Positive for congestion and sore throat. Negative for mouth sores, sinus pressure and voice change.   Eyes:  Negative for visual disturbance.  Respiratory:  Positive for cough. Negative for chest tightness.   Gastrointestinal:  Negative for abdominal pain and nausea.  Genitourinary:  Negative for difficulty urinating, frequency and vaginal pain.  Musculoskeletal:  Negative for back pain and gait problem.  Skin:  Negative for pallor and rash.  Neurological:  Negative for dizziness, tremors, weakness, numbness and headaches.  Psychiatric/Behavioral:  Negative for confusion and sleep disturbance.     Objective:  BP 110/70   Pulse 75   Temp 98.2 F (36.8 C) (Oral)   Ht 5\' 1"  (1.549 m)   Wt 102 lb (46.3 kg)   SpO2 97%   BMI 19.27 kg/m   BP Readings from Last 3 Encounters:  09/02/23 110/70  09/01/23 (!) 170/81  08/30/23 (!) 144/75    Wt Readings from Last 3 Encounters:  09/02/23 102 lb (46.3 kg)  07/11/23 102 lb (46.3 kg)  07/10/23 104 lb (47.2 kg)    Physical  Exam Constitutional:      General: She is not in acute distress.    Appearance: Normal appearance. She is well-developed.  HENT:     Head: Normocephalic.     Right Ear: External ear normal.     Left Ear: External ear normal.     Nose: Nose normal.  Eyes:     General:        Right eye: No discharge.        Left eye: No discharge.     Conjunctiva/sclera: Conjunctivae normal.     Pupils: Pupils are equal, round, and reactive to light.  Neck:     Thyroid: No thyromegaly.     Vascular: No JVD.     Trachea: No tracheal deviation.  Cardiovascular:     Rate and Rhythm: Normal rate and regular rhythm.     Heart sounds: Normal heart sounds.  Pulmonary:     Effort: No respiratory distress.     Breath sounds: No stridor. No wheezing.  Abdominal:     General: Bowel sounds are normal. There is no distension.     Palpations: Abdomen is soft. There is no mass.     Tenderness: There is no abdominal tenderness. There is no guarding or rebound.  Musculoskeletal:        General: No tenderness.     Cervical back: Normal range of motion and neck supple. No rigidity.  Lymphadenopathy:     Cervical: No cervical adenopathy.  Skin:    Findings: No erythema or rash.  Neurological:     Cranial Nerves: No cranial nerve deficit.     Motor: No abnormal muscle tone.     Coordination: Coordination normal.     Deep Tendon Reflexes: Reflexes normal.  Psychiatric:        Behavior: Behavior normal.        Thought Content: Thought content normal.        Judgment: Judgment normal.   Eryth throat  Lab Results  Component Value Date   WBC 3.9 (L) 02/27/2023   HGB 12.9 02/27/2023   HCT 39.0 02/27/2023   PLT 191.0 02/27/2023   GLUCOSE 87 02/27/2023   CHOL 205 (H) 02/27/2023   TRIG 80.0 02/27/2023   HDL 77.50 02/27/2023   LDLCALC 112 (H) 02/27/2023   ALT 14 02/27/2023   AST 18 02/27/2023   NA 140 02/27/2023   K 3.8 02/27/2023   CL 103 02/27/2023   CREATININE 0.86 02/27/2023   BUN 15 02/27/2023    CO2 31 02/27/2023   TSH 2.21 02/27/2023    No results found.  Assessment & Plan:   Problem List Items Addressed This Visit  Palpitations   H/o palpitations triggered by COVID-vaccine. Doing ok      Cough   URI. Hycodan prn  Potential benefits of a short term opioids use as well as potential risks (i.e. addiction risk, apnea etc) and complications (i.e. Somnolence, constipation and others) were explained to the patient and were aknowledged.       Upper respiratory infection - Primary   Cefdinir Hycodan prn      Relevant Medications   cefdinir (OMNICEF) 300 MG capsule      Meds ordered this encounter  Medications   cefdinir (OMNICEF) 300 MG capsule    Sig: Take 1 capsule (300 mg total) by mouth 2 (two) times daily.    Dispense:  20 capsule    Refill:  0   HYDROcodone bit-homatropine (HYCODAN) 5-1.5 MG/5ML syrup    Sig: Take 5 mLs by mouth every 4 (four) hours as needed for cough.    Dispense:  240 mL    Refill:  0      Follow-up: Return for a follow-up visit.  Sonda Primes, MD

## 2023-09-04 ENCOUNTER — Encounter: Payer: Self-pay | Admitting: Internal Medicine

## 2023-09-05 ENCOUNTER — Encounter: Payer: Self-pay | Admitting: Internal Medicine

## 2023-09-05 ENCOUNTER — Ambulatory Visit: Admitting: Internal Medicine

## 2023-09-06 DIAGNOSIS — L111 Transient acantholytic dermatosis [Grover]: Secondary | ICD-10-CM | POA: Diagnosis not present

## 2023-09-10 ENCOUNTER — Ambulatory Visit: Admitting: Internal Medicine

## 2023-09-12 ENCOUNTER — Ambulatory Visit: Payer: BLUE CROSS/BLUE SHIELD | Admitting: Internal Medicine

## 2023-09-16 DIAGNOSIS — J3081 Allergic rhinitis due to animal (cat) (dog) hair and dander: Secondary | ICD-10-CM | POA: Diagnosis not present

## 2023-09-16 DIAGNOSIS — J3089 Other allergic rhinitis: Secondary | ICD-10-CM | POA: Diagnosis not present

## 2023-09-16 DIAGNOSIS — J301 Allergic rhinitis due to pollen: Secondary | ICD-10-CM | POA: Diagnosis not present

## 2023-09-18 ENCOUNTER — Ambulatory Visit: Admitting: Internal Medicine

## 2023-09-19 ENCOUNTER — Ambulatory Visit: Payer: Medicare Other | Admitting: Internal Medicine

## 2023-09-24 DIAGNOSIS — J301 Allergic rhinitis due to pollen: Secondary | ICD-10-CM | POA: Diagnosis not present

## 2023-09-24 DIAGNOSIS — R21 Rash and other nonspecific skin eruption: Secondary | ICD-10-CM | POA: Diagnosis not present

## 2023-09-24 DIAGNOSIS — J3089 Other allergic rhinitis: Secondary | ICD-10-CM | POA: Diagnosis not present

## 2023-09-25 ENCOUNTER — Telehealth: Payer: Self-pay | Admitting: Internal Medicine

## 2023-09-25 NOTE — Telephone Encounter (Signed)
 Patient called requested to specifically speak with with you if possible. Regarding cologuard.

## 2023-09-27 DIAGNOSIS — J301 Allergic rhinitis due to pollen: Secondary | ICD-10-CM | POA: Diagnosis not present

## 2023-09-27 DIAGNOSIS — J3089 Other allergic rhinitis: Secondary | ICD-10-CM | POA: Diagnosis not present

## 2023-09-27 DIAGNOSIS — J3081 Allergic rhinitis due to animal (cat) (dog) hair and dander: Secondary | ICD-10-CM | POA: Diagnosis not present

## 2023-09-27 NOTE — Telephone Encounter (Signed)
 Spoke with patient who recently had a negative cologuard.  Patient is questioning if she should have a regular colonoscopy or not.  I told her I'm not sure if the negative cologuard would affect insurance coverage of a procedure.  I will do some research and call her back.

## 2023-09-30 DIAGNOSIS — N6314 Unspecified lump in the right breast, lower inner quadrant: Secondary | ICD-10-CM | POA: Diagnosis not present

## 2023-09-30 DIAGNOSIS — R92343 Mammographic extreme density, bilateral breasts: Secondary | ICD-10-CM | POA: Diagnosis not present

## 2023-09-30 NOTE — Telephone Encounter (Signed)
 Cologuard is good for three years from the negative result

## 2023-09-30 NOTE — Telephone Encounter (Signed)
 Pt is second guessing using a Cologuard as screening technique even though results were negative.  Before I have her see if her insurance company would even cover a procedure at this point, I wanted to see if you would even be willing to do a colonoscopy - patient has no symptoms or complaints, she's just worried about her decision.  Please advise and then I'll go from there

## 2023-10-01 NOTE — Telephone Encounter (Signed)
 Lm on vm Dr. Alberta Almond response regarding Cologuard.  I take his answer to mean she does not need a colonoscopy at this time unless something changes with her medical history

## 2023-10-03 DIAGNOSIS — J3081 Allergic rhinitis due to animal (cat) (dog) hair and dander: Secondary | ICD-10-CM | POA: Diagnosis not present

## 2023-10-03 DIAGNOSIS — J3089 Other allergic rhinitis: Secondary | ICD-10-CM | POA: Diagnosis not present

## 2023-10-03 DIAGNOSIS — J301 Allergic rhinitis due to pollen: Secondary | ICD-10-CM | POA: Diagnosis not present

## 2023-10-08 ENCOUNTER — Ambulatory Visit: Admitting: Internal Medicine

## 2023-10-10 ENCOUNTER — Encounter: Payer: Self-pay | Admitting: Neurology

## 2023-10-10 ENCOUNTER — Ambulatory Visit (INDEPENDENT_AMBULATORY_CARE_PROVIDER_SITE_OTHER): Admitting: Neurology

## 2023-10-10 VITALS — BP 118/62 | HR 67 | Ht 61.0 in | Wt 102.0 lb

## 2023-10-10 DIAGNOSIS — R253 Fasciculation: Secondary | ICD-10-CM | POA: Diagnosis not present

## 2023-10-10 DIAGNOSIS — M5010 Cervical disc disorder with radiculopathy, unspecified cervical region: Secondary | ICD-10-CM

## 2023-10-10 NOTE — Progress Notes (Signed)
 Provider:  Neomia Banner, MD  Primary Care Physician:  Genia Kettering, MD [address] [place] [address]     Referring Provider: Plotnikov, Oakley Bellman, Md [address] [place],  [place] [address]          Chief Complaint according to patient   Patient presents with:                HISTORY OF PRESENT ILLNESS:  [name] is a 73 y.o. female patient who is here for a concern of dropping objects on  10/10/2023. The patient has a longstanding history of benign fasciculations.  She also had a work up for palpitations.   Chief concern according to patient :  " I started to drop  light objects, these slip from my hand and I am startled by it-  I didn' t even feel that the paper or the earring slipped through my fingers. " The heaviest object I dropped was a 5 ounce water containing  thermos bottle, most objects were lighter.  I have not noticed any myoclonus, any jerking in my hand".    [name] is a 73 y.o. female patient who is here for revisit 04/16/2023 for  yearly routine, benign fasciculations. She reports her headaches have improved after she quit drinking diet sweetened beverages.  Chief concern according to patient :  " I have now osteoporosis, and was placed on Vit D, K and calcium- have still some headaches but much ,much less- , was asked to start using a neck support pillow". My sister has been diagnosed with MCI and aphasia. She is 5 years older, my mother is working as a Holiday representative at age 14 and very sharp, she drives. My father was sharp until he died at 38. He had COPD "  We discussed if she is predisposed for MCI-.  Her sister has been diagnosed with MCI.            [name] is a 73 y.o. female here as a patient of  Dr. Georgia Kipper for follow up on her beningn fasciculations and occipital neuralgia, vertigo-headaches. ENT Dr Donalee Fruits has recommended a [facility] . RM 10, alone. Doing well from neurology standpoint.  Had bone density test via PCP. Found osteoporosis in femoral neck. Has PCP appt for f/u on this early November. She considers joining Osteo -strong. She has been weight training for 20 years, and takes vit D. She may need to consider medication treatment with Dr Georgia Kipper.  No recent fasciculations. Migraine AURAS persist without migraine to follow. No nausea.      [name] has reported some intermittent palpitations be had intermittent palpitations discussed even in October 2018 but she feels that at this time they have been more common after her COVID vaccination with Moderna and for the last 4 months she has been symptom-free in that regard.  As I have mentioned before she no longer takes any caffeine she does not drink alcohol and she has occasional but rather rare benign fasciculations.  She is doing very well overall and we need once a year to address any questions that come up she sometimes reports auras but she no longer has daily headaches following these.   RV 04-11-2020: CT head in February 2021 was negative. CT cervical spine showed C4-5 foraminal narrowing, bone spurs, and DDD.  Her headaches have done better. She presented me a video of her neck facilitating - benign, no  ALS, as we have many times discussed before. She has noted a correlation between caffeine and fasciculation. She is active at [facility]- and she is fully vaccinated.     Social HX : retired and physically active, healthy lifestyle.      Review of Systems: Out of a complete 14 system review, the patient complains of only the following symptoms, and all other reviewed systems are negative.:    Patient is able to hold heavy objects . Lifts weight, opens jars and tr urning keys and using a touch screen without complication. No balance problems.    " Y things slipping through my fingers , unnoticed   Social History   Socioeconomic History   Marital status: Married    Spouse name: Sims Duck   Number of children: 0    Years of education: 14   Highest education level: Not on file  Occupational History   Occupation: OFFICE liasion    Employer: [employer]   Occupation: Real Chief Technology Officer   Occupation: RETIRED/OFFICE [employer]    Employer: [employer]  Tobacco Use   Smoking status: Never    Passive exposure: Past   Smokeless tobacco: Never  Vaping Use   Vaping status: Never Used  Substance and Sexual Activity   Alcohol use: No   Drug use: No   Sexual activity: Yes    Birth control/protection: Post-menopausal    Comment: less than 5, IC after 18, no STD, no abnormal pap, no DES exposure  Other Topics Concern   Not on file  Social History Narrative   Vocational School in [place]. Married '90 - marriage is in very good shape '12, No children.  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 Offerred medical services for them if needed (Nov '11)   Patient is married Sims Duck) and lives at home with her husband.   Patient is working Pensions consultant work.   Patient has a high school education.   Patient is right-handed.   Patient does not drink any caffeine.      Lives with husband-2025      Social Drivers of Health   Financial Resource Strain: Low Risk  (07/09/2023)   Overall Financial Resource Strain (CARDIA)    Difficulty of Paying Living Expenses: Not hard at all  Food Insecurity: No Food Insecurity (07/09/2023)   Hunger Vital Sign    Worried About Running Out of Food in the Last Year: Never true    Ran Out of Food in the Last Year: Never true  Transportation Needs: No Transportation Needs (07/09/2023)   PRAPARE - Administrator, Civil Service (Medical): No    Lack of Transportation (Non-Medical): No  Physical Activity: Sufficiently Active (07/09/2023)   Exercise Vital Sign    Days of Exercise per Week: 6 days    Minutes of Exercise per Session: 60 min  Stress: No Stress Concern Present  (07/09/2023)   [employer] of Occupational Health - Occupational Stress Questionnaire    Feeling of Stress : Not at all  Social Connections: Socially Integrated (07/09/2023)   Social Connection and Isolation Panel [NHANES]    Frequency of Communication with Friends and Family: More than three times a week    Frequency of Social Gatherings with Friends and Family: Three times a week    Attends Religious Services: More than 4 times per year    Active Member of Clubs or Organizations:  Yes    Attends Banker Meetings: Patient declined    Marital Status: Married    Family History  Problem Relation Age of Onset   Hyperlipidemia Mother    Hypertension Mother    Hyperlipidemia Father    COPD Father        Smoker, deceased Jun 27, 2014   Hypertension Father    Hypertension Sister    Hyperlipidemia Sister    Headache Sister    Diabetes Neg Hx    Colon cancer Neg Hx    Breast cancer Neg Hx    Coronary artery disease Neg Hx    Esophageal cancer Neg Hx    Stomach cancer Neg Hx    Pancreatic cancer Neg Hx    Liver disease Neg Hx     Past Medical History:  Diagnosis Date   Allergic rhinitis, cause unspecified    Backache, unspecified    Benign fasciculation-cramp syndrome 12/03/2012    Worsening with  stress, fatigue .   Calculus of kidney    Conversion disorder    Cotton wool spots    Esophageal reflux    Floater, vitreous, left 06-27-16   GERD (gastroesophageal reflux disease)    Hemorrhage of rectum and anus    Internal hemorrhoids without mention of complication    Irritable bowel syndrome    Microscopic hematuria    Migraine headache with aura    Mild aortic insufficiency    Mild tricuspid regurgitation    Osteopenia    Other plastic surgery for unacceptable cosmetic appearance    Inj tx fllers/expander   Personal history of urinary calculi    Postmenopausal atrophic vaginitis    Premature atrial contractions    PVC (premature ventricular contraction)    a.  Event monitor 2013.   Unspecified constipation    Vasovagal syncope     Past Surgical History:  Procedure Laterality Date   BREAST BIOPSY Right    Cosmetic Procedures w/Injection therapy     SKIN CANCER EXCISION Left    thigh   WISDOM TOOTH EXTRACTION       Current Outpatient Medications on File Prior to Visit  Medication Sig Dispense Refill   Botulinum Toxin Type A, Cosm, 50 units SOLR Inject 4-6 Units into the muscle. 3 month  injection     cefdinir  (OMNICEF ) 300 MG capsule Take 1 capsule (300 mg total) by mouth 2 (two) times daily. 20 capsule 0   Cyanocobalamin  (B-12) 1000 MCG SUBL PLACE 1 TABLET (1,000 MCG TOTAL) UNDER THE TONGUE DAILY. 100 tablet 3   DEXILANT  60 MG capsule TAKE 1 CAPSULE BY MOUTH EVERY DAY 30 capsule 11   diltiazem  (CARDIZEM ) 30 MG tablet Take 1 tablet (30 mg total) by mouth every 6 (six) hours as needed. For elevated heart rates 30 tablet 1   EPIPEN 2-PAK 0.3 MG/0.3ML SOAJ injection See admin instructions. Reported on 10/08/2015  1   EUCRISA 2 % OINT Apply topically. Apply 1 to 2 times a day     famotidine  (PEPCID ) 20 MG tablet Take 1 tablet 1 hour before bedtime. 30 tablet 2   famotidine  (PEPCID ) 40 MG tablet TAKE 1 TABLET 1 HOUR BEFORE BED 90 tablet 1   HYDROcodone  bit-homatropine (HYCODAN) 5-1.5 MG/5ML syrup Take 5 mLs by mouth every 4 (four) hours as needed for cough. 240 mL 0   loratadine  (CLARITIN ) 10 MG tablet Take 1 tablet (10 mg total) by mouth daily. (Patient taking differently: Take 10 mg by mouth daily as needed for allergies,  rhinitis or itching.) 100 tablet 3   lubiprostone  (AMITIZA ) 8 MCG capsule TAKE 1 CAPSULE (8 MCG TOTAL) BY MOUTH 2 (TWO) TIMES DAILY WITH A MEAL. 60 capsule 3   mometasone  (NASONEX ) 50 MCG/ACT nasal spray USE 2 SPRAYS IN EACH NOSTRIL EVERY DAY 51 each 1   NONFORMULARY OR COMPOUNDED ITEM Estradiol  vaginal cream 0.02%, 1 ml syringe, place 1 ml per vagina at hs three times per week. Disp:  12, RF:  11.  [facility]. 12 each 12    oseltamivir  (TAMIFLU ) 75 MG capsule Take 1 capsule (75 mg total) by mouth 2 (two) times daily. 10 capsule 0   PREMARIN  vaginal cream Use PV as directed 30 g 5   timolol (TIMOPTIC) 0.25 % ophthalmic solution SMARTSIG:In Eye(s)     tretinoin (RETIN-A) 0.05 % cream Apply 1 application topically at bedtime.     triamcinolone  ointment (KENALOG ) 0.1 % Apply topically 2 (two) times daily. 160 g 1   UNABLE TO FIND once a week. Med Name: allergy shots     valACYclovir  (VALTREX ) 500 MG tablet Take 1 tablet (500 mg total) by mouth 2 (two) times daily. 10 tablet 2   VITAMIN D -VITAMIN K PO Take by mouth.     No current facility-administered medications on file prior to visit.    Allergies  Allergen Reactions   Codeine Other (See Comments)    Doesn't remeber   Doxycycline  Other (See Comments)   Epinephrine     Heart racing    Latex     Redness and swelling    Neomycin     Topical rash from cream   Penicillins Hives and Other (See Comments)    Has patient had a PCN reaction causing immediate rash, facial/tongue/throat swelling, SOB or lightheadedness with hypotension: Yes Has patient had a PCN reaction causing severe rash involving mucus membranes or skin necrosis: Yes Has patient had a PCN reaction that required hospitalization No Has patient had a PCN reaction occurring within the last 10 years: No If all of the above answers are "NO", then may proceed with Cephalosporin use.    Protonix  [Pantoprazole  Sodium]     ?HAs   Sulfasalazine Other (See Comments)    Doesn't remember     Doesn't remember   Sulfonamide Derivatives Other (See Comments)    Doesn't remember    Zithromax  [Azithromycin ] Rash     DIAGNOSTIC DATA (LABS, IMAGING, TESTING) - I reviewed patient records, labs, notes, testing and imaging myself where available.  Lab Results  Component Value Date   WBC 3.9 (L) 02/27/2023   HGB 12.9 02/27/2023   HCT 39.0 02/27/2023   MCV 93.1 02/27/2023   PLT 191.0 02/27/2023       Component Value Date/Time   NA 140 02/27/2023 0830   NA 145 (H) 08/04/2018 1634   K 3.8 02/27/2023 0830   CL 103 02/27/2023 0830   CO2 31 02/27/2023 0830   GLUCOSE 87 02/27/2023 0830   GLUCOSE 91 06/25/2006 0741   BUN 15 02/27/2023 0830   BUN 22 08/04/2018 1634   CREATININE 0.86 02/27/2023 0830   CREATININE 0.88 02/17/2020 0932   CALCIUM 9.8 02/27/2023 0830   PROT 7.2 02/27/2023 0830   PROT 7.2 02/15/2016 1453   ALBUMIN 4.2 02/27/2023 0830   ALBUMIN 4.7 02/15/2016 1453   AST 18 02/27/2023 0830   ALT 14 02/27/2023 0830   ALKPHOS 68 02/27/2023 0830   BILITOT 0.6 02/27/2023 0830   BILITOT 0.3 02/15/2016 1453   GFRNONAA  68 02/17/2020 0932   GFRAA 79 02/17/2020 0932   Lab Results  Component Value Date   CHOL 205 (H) 02/27/2023   HDL 77.50 02/27/2023   LDLCALC 112 (H) 02/27/2023   TRIG 80.0 02/27/2023   CHOLHDL 3 02/27/2023   No results found for: "HGBA1C" Lab Results  Component Value Date   VITAMINB12 670 04/18/2022   Lab Results  Component Value Date   TSH 2.21 02/27/2023    PHYSICAL EXAM:  Today's Vitals   10/10/23 0934  BP: 118/62  Pulse: 67  Weight: 102 lb (46.3 kg)  Height: 5\' 1"  (1.549 m)   Body mass index is 19.27 kg/m.   Wt Readings from Last 3 Encounters:  10/10/23 102 lb (46.3 kg)  09/02/23 102 lb (46.3 kg)  07/11/23 102 lb (46.3 kg)     Ht Readings from Last 3 Encounters:  10/10/23 5\' 1"  (1.549 m)  09/02/23 5\' 1"  (1.549 m)  07/11/23 5\' 1"  (1.549 m)      General: The patient is awake, alert and appears not in acute distress. The patient is well groomed. Head: Normocephalic, atraumatic. Neck is supple. Normocephalic, atraumatic. Neck is supple. Mallampati 1- wide open-  neck circumference 12.5 inches, No retrognathia. TMJ click on the right.  Cardiovascular:  Regular rate and rhythm, without  murmurs or carotid bruit, and without distended neck veins.  Respiratory: Lungs are clear to auscultation. Skin:  Without evidence of edema, or  rash Trunk: BMI low normal. 19.27 kg/m2.    Neurologic exam : The patient is awake and alert, oriented to place and time.   Memory subjective described as intact. There is a normal attention span & concentration ability.  Speech is fluent without  dysarthria, dysphonia or aphasia.  Mood and affect are appropriate.   Cranial nerves: Pupils are equal and briskly reactive to light. Extraocular movements  in vertical and horizontal planes intact and without nystagmus. Never had nystagmus.  Hearing to finger rub intact.  Facial sensation intact to fine touch. Facial motor strength is symmetric and tongue and uvula move midline. Motor exam: Normal tone and normal muscle bulk and symmetric normal strength in all extremities. No fasciculations.  Sensory:  vibration sensation intact in all extremities.  Coordination: Rapid alternating movements in the fingers/hands is normal.  Finger-to-nose maneuver tested and normal without evidence of ataxia, dysmetria or tremor. Gait and station: Patient walks without assistive device.   Deep tendon reflexes: in the  upper and lower extremities are symmetric and intact.  Babinski response was deferred.   ASSESSMENT AND PLAN :   73 y.o. year old female here with:   Light objects slipping through my left hand     1)  no evidence of neuropathy, of fasciculations today, no evidence of weakness.   2) I recommend to place a rubber band around potentially slippery objects.  There is no yield in NCV/ EMG , no neuropathy panel.      I plan to follow up either personally or through our NP within 12 months.   I would like to thank Plotnikov, Oakley Bellman, MD and Plotnikov, Oakley Bellman, Md [address] [place],  [place] [address] for allowing me to meet with and to take care of this pleasant patient.     After spending a total time of  25  minutes face to face and additional time for physical and neurologic examination, review of laboratory studies,   personal review of imaging studies, reports and results of other testing  and review of referral information / records as far as provided in visit,   Electronically signed by: Neomia Banner, MD 10/10/2023 9:46 AM  [facility] Board certified by The [employer] of Sleep Medicine and Diplomate of the [employer] of Sleep Medicine. Board certified In Neurology through the ABPN, Fellow of the [employer] of Neurology.

## 2023-10-11 DIAGNOSIS — J3089 Other allergic rhinitis: Secondary | ICD-10-CM | POA: Diagnosis not present

## 2023-10-11 DIAGNOSIS — J3081 Allergic rhinitis due to animal (cat) (dog) hair and dander: Secondary | ICD-10-CM | POA: Diagnosis not present

## 2023-10-11 DIAGNOSIS — J301 Allergic rhinitis due to pollen: Secondary | ICD-10-CM | POA: Diagnosis not present

## 2023-10-16 DIAGNOSIS — J3089 Other allergic rhinitis: Secondary | ICD-10-CM | POA: Diagnosis not present

## 2023-10-16 DIAGNOSIS — J3081 Allergic rhinitis due to animal (cat) (dog) hair and dander: Secondary | ICD-10-CM | POA: Diagnosis not present

## 2023-10-16 DIAGNOSIS — J301 Allergic rhinitis due to pollen: Secondary | ICD-10-CM | POA: Diagnosis not present

## 2023-10-21 ENCOUNTER — Other Ambulatory Visit: Payer: Self-pay | Admitting: Internal Medicine

## 2023-10-24 ENCOUNTER — Telehealth: Payer: Self-pay

## 2023-10-24 ENCOUNTER — Encounter: Payer: Self-pay | Admitting: Obstetrics and Gynecology

## 2023-10-24 ENCOUNTER — Ambulatory Visit: Admitting: Family Medicine

## 2023-10-24 DIAGNOSIS — J3089 Other allergic rhinitis: Secondary | ICD-10-CM | POA: Diagnosis not present

## 2023-10-24 DIAGNOSIS — J301 Allergic rhinitis due to pollen: Secondary | ICD-10-CM | POA: Diagnosis not present

## 2023-10-24 DIAGNOSIS — J3081 Allergic rhinitis due to animal (cat) (dog) hair and dander: Secondary | ICD-10-CM | POA: Diagnosis not present

## 2023-10-24 NOTE — Telephone Encounter (Signed)
 Call placed to patient. Patient scheduled for ov tomorrow for possible yeast.

## 2023-10-24 NOTE — Telephone Encounter (Signed)
 erro  neous encounter

## 2023-10-25 ENCOUNTER — Encounter: Payer: Self-pay | Admitting: Obstetrics and Gynecology

## 2023-10-25 ENCOUNTER — Ambulatory Visit (INDEPENDENT_AMBULATORY_CARE_PROVIDER_SITE_OTHER): Admitting: Radiology

## 2023-10-25 ENCOUNTER — Other Ambulatory Visit: Payer: Self-pay | Admitting: *Deleted

## 2023-10-25 ENCOUNTER — Other Ambulatory Visit: Payer: Self-pay | Admitting: Radiology

## 2023-10-25 ENCOUNTER — Encounter: Payer: Self-pay | Admitting: Radiology

## 2023-10-25 VITALS — BP 104/62 | HR 72 | Wt 102.8 lb

## 2023-10-25 DIAGNOSIS — N760 Acute vaginitis: Secondary | ICD-10-CM

## 2023-10-25 DIAGNOSIS — B9689 Other specified bacterial agents as the cause of diseases classified elsewhere: Secondary | ICD-10-CM

## 2023-10-25 DIAGNOSIS — R1032 Left lower quadrant pain: Secondary | ICD-10-CM | POA: Diagnosis not present

## 2023-10-25 LAB — URINALYSIS, COMPLETE W/RFL CULTURE
Bacteria, UA: NONE SEEN /HPF
Bilirubin Urine: NEGATIVE
Glucose, UA: NEGATIVE
Hgb urine dipstick: NEGATIVE
Hyaline Cast: NONE SEEN /LPF
Ketones, ur: NEGATIVE
Leukocyte Esterase: NEGATIVE
Nitrites, Initial: NEGATIVE
Protein, ur: NEGATIVE
RBC / HPF: NONE SEEN /HPF (ref 0–2)
Specific Gravity, Urine: 1.01 (ref 1.001–1.035)
WBC, UA: NONE SEEN /HPF (ref 0–5)
pH: 7 (ref 5.0–8.0)

## 2023-10-25 LAB — WET PREP FOR TRICH, YEAST, CLUE

## 2023-10-25 LAB — NO CULTURE INDICATED

## 2023-10-25 MED ORDER — NUVESSA 1.3 % VA GEL: 1.0000 | Freq: Once | VAGINAL | 0 refills | Status: AC

## 2023-10-25 NOTE — Telephone Encounter (Signed)
 Yes, ok to run as LLQ pain

## 2023-10-25 NOTE — Telephone Encounter (Signed)
 I added the cash pay coupon in the pharmacy notes. Did they not accept this? If they don't we can switch to metrogel x 5 at hs

## 2023-10-25 NOTE — Telephone Encounter (Signed)
 Patient was seen for an appt today. I tried to call patient to speak to her but I got her voicemail. Please advise.

## 2023-10-25 NOTE — Progress Notes (Signed)
      Subjective: [name] is a 73 y.o. female who complains of vaginal itching x 3 days. Recently tried new underwear under her workout pants.    Review of Systems  All other systems reviewed and are negative.   Past Medical History:  Diagnosis Date   Allergic rhinitis, cause unspecified    Backache, unspecified    Benign fasciculation-cramp syndrome 12/03/2012    Worsening with  stress, fatigue .   Calculus of kidney    Conversion disorder    Cotton wool spots    Esophageal reflux    Floater, vitreous, left 05/2016   GERD (gastroesophageal reflux disease)    Hemorrhage of rectum and anus    Internal hemorrhoids without mention of complication    Irritable bowel syndrome    Microscopic hematuria    Migraine headache with aura    Mild aortic insufficiency    Mild tricuspid regurgitation    Osteopenia    Other plastic surgery for unacceptable cosmetic appearance    Inj tx fllers/expander   Personal history of urinary calculi    Postmenopausal atrophic vaginitis    Premature atrial contractions    PVC (premature ventricular contraction)    a. Event monitor 2013.   Unspecified constipation    Vasovagal syncope       Objective:  Today's Vitals   10/25/23 1030  BP: 104/62  Pulse: 72  SpO2: 97%  Weight: 102 lb 12.8 oz (46.6 kg)   Body mass index is 19.42 kg/m.   Physical Exam Vitals and nursing note reviewed. Exam conducted with a chaperone present.  Constitutional:      Appearance: Normal appearance. She is well-developed.  Pulmonary:     Effort: Pulmonary effort is normal.  Abdominal:     General: Abdomen is flat.     Palpations: Abdomen is soft.  Genitourinary:    General: Normal vulva.     Vagina: Vaginal discharge present. No erythema, bleeding or lesions.     Cervix: Normal. No discharge, friability, lesion or erythema.     Uterus: Normal.      Adnexa: Right adnexa normal and left adnexa normal.  Neurological:     Mental Status: She is  alert.  Psychiatric:        Mood and Affect: Mood normal.        Thought Content: Thought content normal.        Judgment: Judgment normal.     Microscopic wet-mount exam shows clue cells.   Ellis Guys, CMA present for exam  Assessment:/Plan:  1. Vulvovaginitis - WET PREP FOR TRICH, YEAST, CLUE  2. Bacterial vaginosis (Primary) - metroNIDAZOLE (NUVESSA) 1.3 % GEL; Place 1 Dose vaginally once for 1 dose.  Dispense: 5 g; Refill: 0   Avoid intercourse until symptoms are resolved. Avoid the use of soaps or perfumed products in the peri area. Avoid tub baths and sitting in sweaty or wet clothing for prolonged periods of time.    Chelsea Pedretti B, NP 10:48 AM

## 2023-10-25 NOTE — Telephone Encounter (Signed)
 I called patient back to let her know. She said she could pay out of pocket for it anyway. I told her you added the cash coupon in the pharmacy notes of the rx. Patient appreciative

## 2023-10-29 DIAGNOSIS — H6123 Impacted cerumen, bilateral: Secondary | ICD-10-CM | POA: Diagnosis not present

## 2023-10-29 NOTE — Telephone Encounter (Signed)
 Schedule repeat OV. Colvin Dec pt (I will be on leave)

## 2023-10-29 NOTE — Telephone Encounter (Signed)
Msg sent to the appt desk.

## 2023-10-30 DIAGNOSIS — D045 Carcinoma in situ of skin of trunk: Secondary | ICD-10-CM | POA: Diagnosis not present

## 2023-10-30 DIAGNOSIS — D485 Neoplasm of uncertain behavior of skin: Secondary | ICD-10-CM | POA: Diagnosis not present

## 2023-10-30 NOTE — Telephone Encounter (Signed)
 Pt scheduled for 5/19 w/ BS. Will route to her as an FYI and close encounter.

## 2023-10-30 NOTE — Telephone Encounter (Signed)
 [name], CMA Left message for patient to call and schedule appointment.

## 2023-10-31 DIAGNOSIS — J301 Allergic rhinitis due to pollen: Secondary | ICD-10-CM | POA: Diagnosis not present

## 2023-10-31 DIAGNOSIS — J3089 Other allergic rhinitis: Secondary | ICD-10-CM | POA: Diagnosis not present

## 2023-10-31 DIAGNOSIS — J3081 Allergic rhinitis due to animal (cat) (dog) hair and dander: Secondary | ICD-10-CM | POA: Diagnosis not present

## 2023-10-31 NOTE — Progress Notes (Signed)
 GYNECOLOGY  VISIT   HPI: 73 y.o.   Married  Caucasian female   No obstetric history on file. with No LMP recorded. Patient is postmenopausal.   here for: Vaginal itching. Was using metroNIDAZOLE  but is still having some discomfort. Started around 10/22/23. No other symptoms other than itching. Would like to discuss testing for ovarian cancer as well.    Seen 10/25/23 and was dx and treated for BV.  Receives Nuvessa  1.3% gel one time dosing.  Urinalysis was normal, so no UC sent.   Symptoms are external and internal.  She notices an increase when she wears more form fitting clothing.   She is finishing up her Premarin  cream, but has not used it recently since treating for BV. Has not started compounded vaginal estrogen cream.   Has hemorrhoids.  Using pads.   No partner change.   Has bloating and constipation.  Bloating after eating.  No bleeding.   Does have some LLQ discomfort.  Hx renal stones.   No FH ovarian cancer.  One of her physician providers just died from ovarian cancer.   Hx HSV I, oral.   GYNECOLOGIC HISTORY: No LMP recorded. Patient is postmenopausal. Contraception:  PMP Menopausal hormone therapy:  Estradiol  vaginal cream  Last 2 paps:  07/01/23 neg HR HPV neg, 01/13/19 neg  History of abnormal Pap or positive HPV:  no Mammogram:  04/08/23 Breast Density Cat C, BIRADS Cat 1 neg         OB History   No obstetric history on file.        Patient Active Problem List   Diagnosis Date Noted   Upper respiratory infection 09/02/2023   Fibrocystic breast changes 04/18/2022   Osteoporosis 04/16/2022   Dizziness 02/22/2022   Toothache 12/20/2021   Gluteal tendinitis of right buttock 03/16/2021   Anxiety 11/03/2020   Allergic rhinitis due to animal hair and dander 06/29/2020   Allergic rhinitis due to pollen 06/29/2020   Stress 05/30/2020   Thoracic spine pain 02/17/2020   Herpes zoster 09/17/2019   Tachycardia 08/10/2019   Vaccination complication  08/10/2019   Adverse effect of COVID-19 vaccine 07/20/2019   Myoneural disorder, unspecified (HCC) 04/09/2019   Sore throat 06/13/2018   Friction injury to skin 04/14/2018   Occipital neuralgia 03/03/2018   B12 deficiency 03/03/2018   Bilateral impacted cerumen 10/21/2017   Excessive cerumen in both ear canals 10/21/2017   LLQ abdominal pain 09/23/2017   Elevated antinuclear antibody (ANA) level 07/26/2017   Arthralgia 07/26/2017   Degenerative disc disease, lumbar 07/04/2017   Cough 06/06/2017   Chest discomfort 03/21/2017   Neck mass 01/17/2017   Snoring 12/18/2016   Medial tibial stress syndrome, left, initial encounter 08/14/2016   Patellofemoral syndrome of both knees 08/09/2016   Drug allergy, antibiotic 05/31/2016   Hyperkalemia 02/17/2016   Vertical diplopia 02/15/2016   Tension headache 12/28/2015   BPPV (benign paroxysmal positional vertigo) 10/08/2015   TMJ click 09/13/2015   Cold sore 07/18/2015   Osteopenia 07/18/2015   Earache on left 07/18/2015   Cervical disc disorder with radiculopathy of cervical region 06/17/2015   Cotton wool spots 03/15/2015   Low back pain 03/08/2015   Food poisoning 03/08/2015   Generalized anxiety disorder 03/08/2015   Dysuria 02/23/2015   Well adult exam 09/09/2014   Internal hemorrhoids 09/07/2014   Allergic urticaria 08/20/2014   Acute sinusitis 08/18/2014   Neck muscle spasm 07/30/2014   Grief 06/17/2014   Mild aortic insufficiency    Mild  tricuspid regurgitation    Vasovagal syncope    Premature atrial contractions    GERD (gastroesophageal reflux disease)    PVC (premature ventricular contraction)    Dense breast tissue on mammogram 03/23/2014   Microhematuria 03/22/2014   Changing skin lesion 02/05/2014   Rash and nonspecific skin eruption 12/22/2013   Chronic meniscal tear of knee 12/21/2013   Benign fasciculations 12/11/2013   Strain of adductor magnus muscle of left lower extremity 05/21/2013   Benign  fasciculation-cramp syndrome 12/03/2012   Vasovagal near syncope 11/02/2012   Migraine with aura 11/02/2012   Upper airway cough syndrome 08/16/2012   Bunion of great toe 09/11/2011   Routine health maintenance 09/11/2011   Mild mitral regurgitation by prior echocardiogram 05/15/2011   Chronic neck pain 03/07/2011   Skin lesion 03/07/2011   DYSPLASTIC NEVUS, FACE 02/06/2010   HAND PAIN, BILATERAL 08/25/2009   Aortic valve disorder 08/02/2009   Abnormal involuntary movement 07/28/2009   Abnormal involuntary movement 07/28/2009   LUMBAR SPRAIN AND STRAIN 05/21/2009   Palpitations 02/03/2009   Internal hemorrhoids 12/15/2008   Constipation 12/15/2008   IBS (irritable bowel syndrome) 12/15/2008   RECTAL BLEEDING 12/15/2008   RENAL CALCULUS, HX OF 11/29/2007   Herpes simplex virus (HSV) infection 11/03/2007   Vaginal atrophy 03/25/2007   GLOBUS HYSTERICUS 01/01/2007   Allergic rhinitis 01/01/2007    Past Medical History:  Diagnosis Date   Allergic rhinitis, cause unspecified    Backache, unspecified    Benign fasciculation-cramp syndrome 12/03/2012    Worsening with  stress, fatigue .   Calculus of kidney    Conversion disorder    Cotton wool spots    Esophageal reflux    Floater, vitreous, left 05/2016   GERD (gastroesophageal reflux disease)    Hemorrhage of rectum and anus    Internal hemorrhoids without mention of complication    Irritable bowel syndrome    Microscopic hematuria    Migraine headache with aura    Mild aortic insufficiency    Mild tricuspid regurgitation    Osteopenia    Other plastic surgery for unacceptable cosmetic appearance    Inj tx fllers/expander   Personal history of urinary calculi    Postmenopausal atrophic vaginitis    Premature atrial contractions    PVC (premature ventricular contraction)    a. Event monitor 2013.   Unspecified constipation    Vasovagal syncope     Past Surgical History:  Procedure Laterality Date   BREAST BIOPSY  Right    Cosmetic Procedures w/Injection therapy     SKIN CANCER EXCISION Left    thigh   WISDOM TOOTH EXTRACTION      Current Outpatient Medications  Medication Sig Dispense Refill   Botulinum Toxin Type A, Cosm, 50 units SOLR Inject 4-6 Units into the muscle. 3 month  injection     Cyanocobalamin  (B-12) 1000 MCG SUBL PLACE 1 TABLET (1,000 MCG TOTAL) UNDER THE TONGUE DAILY. 100 tablet 3   DEXILANT  60 MG capsule TAKE 1 CAPSULE BY MOUTH EVERY DAY 30 capsule 11   EPIPEN 2-PAK 0.3 MG/0.3ML SOAJ injection See admin instructions. Reported on 10/08/2015  1   EUCRISA 2 % OINT Apply topically. Apply 1 to 2 times a day     loratadine  (CLARITIN ) 10 MG tablet Take 1 tablet (10 mg total) by mouth daily. 100 tablet 3   mometasone  (NASONEX ) 50 MCG/ACT nasal spray USE 2 SPRAYS IN EACH NOSTRIL EVERY DAY 51 each 1   NUVESSA  1.3 % GEL SMARTSIG:1 unspecified  Vaginal Once     tretinoin (RETIN-A) 0.05 % cream Apply 1 application topically at bedtime.     triamcinolone  ointment (KENALOG ) 0.1 % Apply topically 2 (two) times daily. 160 g 1   UNABLE TO FIND once a week. Med Name: allergy shots     valACYclovir  (VALTREX ) 500 MG tablet TAKE 1 TABLET BY MOUTH TWICE A DAY 10 tablet 2   VITAMIN D -VITAMIN K PO Take by mouth.     cefdinir  (OMNICEF ) 300 MG capsule Take 1 capsule (300 mg total) by mouth 2 (two) times daily. (Patient not taking: Reported on 11/04/2023) 20 capsule 0   diltiazem  (CARDIZEM ) 30 MG tablet Take 1 tablet (30 mg total) by mouth every 6 (six) hours as needed. For elevated heart rates (Patient not taking: Reported on 11/04/2023) 30 tablet 1   famotidine  (PEPCID ) 20 MG tablet Take 1 tablet 1 hour before bedtime. (Patient not taking: Reported on 11/04/2023) 30 tablet 2   famotidine  (PEPCID ) 40 MG tablet TAKE 1 TABLET 1 HOUR BEFORE BED (Patient not taking: Reported on 11/04/2023) 90 tablet 1   HYDROcodone  bit-homatropine (HYCODAN) 5-1.5 MG/5ML syrup Take 5 mLs by mouth every 4 (four) hours as needed for  cough. (Patient not taking: Reported on 11/04/2023) 240 mL 0   lubiprostone  (AMITIZA ) 8 MCG capsule TAKE 1 CAPSULE (8 MCG TOTAL) BY MOUTH 2 (TWO) TIMES DAILY WITH A MEAL. (Patient not taking: Reported on 11/04/2023) 60 capsule 3   NONFORMULARY OR COMPOUNDED ITEM Estradiol  vaginal cream 0.02%, 1 ml syringe, place 1 ml per vagina at hs three times per week. Disp:  12, RF:  11.  [facility]. (Patient not taking: Reported on 11/04/2023) 12 each 12   oseltamivir  (TAMIFLU ) 75 MG capsule Take 1 capsule (75 mg total) by mouth 2 (two) times daily. (Patient not taking: Reported on 11/04/2023) 10 capsule 0   PREMARIN  vaginal cream Use PV as directed (Patient not taking: Reported on 11/04/2023) 30 g 5   timolol (TIMOPTIC) 0.25 % ophthalmic solution SMARTSIG:In Eye(s) (Patient not taking: Reported on 11/04/2023)     No current facility-administered medications for this visit.     ALLERGIES: Codeine, Doxycycline , Epinephrine, Latex, Neomycin, Penicillins, Promethazine , Protonix  [pantoprazole  sodium], Sulfasalazine, Sulfonamide derivatives, and Zithromax  [azithromycin ]  Family History  Problem Relation Age of Onset   Hyperlipidemia Mother    Hypertension Mother    Hyperlipidemia Father    COPD Father        Smoker, deceased Jun 11, 2014   Hypertension Father    Hypertension Sister    Hyperlipidemia Sister    Headache Sister    Diabetes Neg Hx    Colon cancer Neg Hx    Breast cancer Neg Hx    Coronary artery disease Neg Hx    Esophageal cancer Neg Hx    Stomach cancer Neg Hx    Pancreatic cancer Neg Hx    Liver disease Neg Hx     Social History   Socioeconomic History   Marital status: Married    Spouse name: Sims Duck   Number of children: 0   Years of education: 14   Highest education level: Not on file  Occupational History   Occupation: OFFICE liasion    Employer: [employer]   Occupation: Real Chief Technology Officer   Occupation: RETIRED/OFFICE LEAZON    Employer: [employer]   Tobacco Use   Smoking status: Never    Passive exposure: Past   Smokeless tobacco: Never  Vaping Use   Vaping status: Never  Used  Substance and Sexual Activity   Alcohol use: No   Drug use: No   Sexual activity: Yes    Birth control/protection: Post-menopausal    Comment: less than 5, IC after 18, no STD, no abnormal pap, no DES exposure  Other Topics Concern   Not on file  Social History Narrative   Vocational School in [place]. Married '90 - marriage is in very good shape '12, No children.  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 Offerred medical services for them if needed (Nov '11)   Patient is married Sims Duck) and lives at home with her husband.   Patient is working Pensions consultant work.   Patient has a high school education.   Patient is right-handed.   Patient does not drink any caffeine.      Lives with husband-2025      Social Drivers of Health   Financial Resource Strain: Low Risk  (07/09/2023)   Overall Financial Resource Strain (CARDIA)    Difficulty of Paying Living Expenses: Not hard at all  Food Insecurity: No Food Insecurity (07/09/2023)   Hunger Vital Sign    Worried About Running Out of Food in the Last Year: Never true    Ran Out of Food in the Last Year: Never true  Transportation Needs: No Transportation Needs (07/09/2023)   PRAPARE - Administrator, Civil Service (Medical): No    Lack of Transportation (Non-Medical): No  Physical Activity: Sufficiently Active (07/09/2023)   Exercise Vital Sign    Days of Exercise per Week: 6 days    Minutes of Exercise per Session: 60 min  Stress: No Stress Concern Present (07/09/2023)   [employer] of Occupational Health - Occupational Stress Questionnaire    Feeling of Stress : Not at all  Social Connections: Socially Integrated (07/09/2023)   Social Connection and Isolation Panel [NHANES]    Frequency of  Communication with Friends and Family: More than three times a week    Frequency of Social Gatherings with Friends and Family: Three times a week    Attends Religious Services: More than 4 times per year    Active Member of Clubs or Organizations: Yes    Attends Banker Meetings: Patient declined    Marital Status: Married  Catering manager Violence: Not At Risk (07/10/2023)   Humiliation, Afraid, Rape, and Kick questionnaire    Fear of Current or Ex-Partner: No    Emotionally Abused: No    Physically Abused: No    Sexually Abused: No    Review of Systems  All other systems reviewed and are negative.   PHYSICAL EXAMINATION:   BP 122/82 (BP Location: Left Arm, Patient Position: Sitting, Cuff Size: Normal)   Pulse 82   SpO2 97%     General appearance: alert, cooperative and appears stated age   Pelvic: External genitalia:  no lesions              Urethra:  normal appearing urethra with no masses, tenderness or lesions              Bartholins and Skenes: normal                 Vagina: normal appearing vagina with normal color and discharge, no lesions              Cervix: no lesions  Bimanual Exam:  Uterus:  normal size, contour, position, consistency, mobility, non-tender              Adnexa: no mass, fullness, tenderness      Chaperone was present for exam:  Cottie Diss, CMA  ASSESSMENT:  Vulvovaginitis.  Atrophy.  LLQ discomfort.  Hx IBS.  Hx renal stones.   PLAN:  Wet prep:  negative yeast, negative clue cells, negative trichomonas.  She would like Nuswab sent for vaginitis testing.  She will start the compounded vaginal estradiol  cream.  We discussed benefits of treating atrophy and reducing risk of postmenopausal urinary tract infections.  Return for pelvic US .  We reviewed that there is not specific recommended test for screening for ovarian cancer.   25 min  total time was spent for this patient encounter, including preparation,  face-to-face counseling with the patient, coordination of care, and documentation of the encounter.

## 2023-11-04 ENCOUNTER — Ambulatory Visit: Admitting: Family Medicine

## 2023-11-04 ENCOUNTER — Encounter: Payer: Self-pay | Admitting: Obstetrics and Gynecology

## 2023-11-04 ENCOUNTER — Ambulatory Visit (INDEPENDENT_AMBULATORY_CARE_PROVIDER_SITE_OTHER): Admitting: Obstetrics and Gynecology

## 2023-11-04 ENCOUNTER — Other Ambulatory Visit: Payer: Self-pay | Admitting: Internal Medicine

## 2023-11-04 ENCOUNTER — Other Ambulatory Visit (HOSPITAL_COMMUNITY)
Admission: RE | Admit: 2023-11-04 | Discharge: 2023-11-04 | Disposition: A | Discharge status: Home / Self Care | Source: Ambulatory Visit | Attending: Obstetrics and Gynecology | Admitting: Obstetrics and Gynecology

## 2023-11-04 VITALS — BP 122/82 | HR 82

## 2023-11-04 DIAGNOSIS — N760 Acute vaginitis: Secondary | ICD-10-CM | POA: Diagnosis not present

## 2023-11-04 DIAGNOSIS — R1032 Left lower quadrant pain: Secondary | ICD-10-CM | POA: Diagnosis not present

## 2023-11-04 LAB — WET PREP FOR TRICH, YEAST, CLUE

## 2023-11-05 ENCOUNTER — Ambulatory Visit: Payer: Self-pay | Admitting: Obstetrics and Gynecology

## 2023-11-05 LAB — CERVICOVAGINAL ANCILLARY ONLY
Bacterial Vaginitis (gardnerella): NEGATIVE
Candida Glabrata: NEGATIVE
Candida Vaginitis: NEGATIVE
Comment: NEGATIVE
Comment: NEGATIVE
Comment: NEGATIVE
Comment: NEGATIVE
Trichomonas: NEGATIVE

## 2023-11-06 ENCOUNTER — Ambulatory Visit: Admitting: Family Medicine

## 2023-11-07 DIAGNOSIS — J3089 Other allergic rhinitis: Secondary | ICD-10-CM | POA: Diagnosis not present

## 2023-11-07 DIAGNOSIS — G43109 Migraine with aura, not intractable, without status migrainosus: Secondary | ICD-10-CM | POA: Diagnosis not present

## 2023-11-07 DIAGNOSIS — H04123 Dry eye syndrome of bilateral lacrimal glands: Secondary | ICD-10-CM | POA: Diagnosis not present

## 2023-11-07 DIAGNOSIS — J301 Allergic rhinitis due to pollen: Secondary | ICD-10-CM | POA: Diagnosis not present

## 2023-11-07 DIAGNOSIS — J3081 Allergic rhinitis due to animal (cat) (dog) hair and dander: Secondary | ICD-10-CM | POA: Diagnosis not present

## 2023-11-13 DIAGNOSIS — J301 Allergic rhinitis due to pollen: Secondary | ICD-10-CM | POA: Diagnosis not present

## 2023-11-13 DIAGNOSIS — J3081 Allergic rhinitis due to animal (cat) (dog) hair and dander: Secondary | ICD-10-CM | POA: Diagnosis not present

## 2023-11-13 DIAGNOSIS — J3089 Other allergic rhinitis: Secondary | ICD-10-CM | POA: Diagnosis not present

## 2023-11-21 DIAGNOSIS — L570 Actinic keratosis: Secondary | ICD-10-CM | POA: Diagnosis not present

## 2023-11-21 DIAGNOSIS — D045 Carcinoma in situ of skin of trunk: Secondary | ICD-10-CM | POA: Diagnosis not present

## 2023-11-25 ENCOUNTER — Encounter: Payer: Self-pay | Admitting: Podiatry

## 2023-11-26 DIAGNOSIS — J3081 Allergic rhinitis due to animal (cat) (dog) hair and dander: Secondary | ICD-10-CM | POA: Diagnosis not present

## 2023-11-26 DIAGNOSIS — J301 Allergic rhinitis due to pollen: Secondary | ICD-10-CM | POA: Diagnosis not present

## 2023-11-26 DIAGNOSIS — J3089 Other allergic rhinitis: Secondary | ICD-10-CM | POA: Diagnosis not present

## 2023-11-29 ENCOUNTER — Encounter: Payer: Self-pay | Admitting: Podiatry

## 2023-12-01 ENCOUNTER — Encounter: Payer: Self-pay | Admitting: Cardiovascular Disease

## 2023-12-02 ENCOUNTER — Telehealth: Payer: Self-pay | Admitting: Podiatry

## 2023-12-02 NOTE — Telephone Encounter (Signed)
 Patient called she is having issues with a pair of orthotics that she was given. Patient states person cut them but she thinks its too short for her shoes.

## 2023-12-03 ENCOUNTER — Encounter: Payer: Self-pay | Admitting: Podiatry

## 2023-12-04 ENCOUNTER — Other Ambulatory Visit: Admitting: Obstetrics and Gynecology

## 2023-12-04 ENCOUNTER — Other Ambulatory Visit

## 2023-12-04 DIAGNOSIS — J301 Allergic rhinitis due to pollen: Secondary | ICD-10-CM | POA: Diagnosis not present

## 2023-12-04 DIAGNOSIS — J3081 Allergic rhinitis due to animal (cat) (dog) hair and dander: Secondary | ICD-10-CM | POA: Diagnosis not present

## 2023-12-04 DIAGNOSIS — J3089 Other allergic rhinitis: Secondary | ICD-10-CM | POA: Diagnosis not present

## 2023-12-10 ENCOUNTER — Ambulatory Visit

## 2023-12-10 NOTE — Progress Notes (Signed)
 Dress pair of orthotics were made in a 3/4 length and patient cannot wear in her dress boots so would like top cover extended with padded leather full length and posting added to heel  Sent to [facility]

## 2023-12-11 DIAGNOSIS — J301 Allergic rhinitis due to pollen: Secondary | ICD-10-CM | POA: Diagnosis not present

## 2023-12-11 DIAGNOSIS — J3089 Other allergic rhinitis: Secondary | ICD-10-CM | POA: Diagnosis not present

## 2023-12-11 DIAGNOSIS — J3081 Allergic rhinitis due to animal (cat) (dog) hair and dander: Secondary | ICD-10-CM | POA: Diagnosis not present

## 2023-12-12 DIAGNOSIS — S2096XA Insect bite (nonvenomous) of unspecified parts of thorax, initial encounter: Secondary | ICD-10-CM | POA: Diagnosis not present

## 2023-12-12 DIAGNOSIS — L658 Other specified nonscarring hair loss: Secondary | ICD-10-CM | POA: Diagnosis not present

## 2023-12-18 DIAGNOSIS — J301 Allergic rhinitis due to pollen: Secondary | ICD-10-CM | POA: Diagnosis not present

## 2023-12-18 DIAGNOSIS — J3089 Other allergic rhinitis: Secondary | ICD-10-CM | POA: Diagnosis not present

## 2023-12-18 DIAGNOSIS — J3081 Allergic rhinitis due to animal (cat) (dog) hair and dander: Secondary | ICD-10-CM | POA: Diagnosis not present

## 2023-12-23 ENCOUNTER — Encounter: Payer: Self-pay | Admitting: Internal Medicine

## 2023-12-23 ENCOUNTER — Ambulatory Visit (INDEPENDENT_AMBULATORY_CARE_PROVIDER_SITE_OTHER): Admitting: Internal Medicine

## 2023-12-23 ENCOUNTER — Ambulatory Visit: Admitting: Internal Medicine

## 2023-12-23 VITALS — BP 118/70 | HR 77 | Temp 98.3°F | Ht <= 58 in | Wt 106.0 lb

## 2023-12-23 DIAGNOSIS — R002 Palpitations: Secondary | ICD-10-CM | POA: Diagnosis not present

## 2023-12-23 DIAGNOSIS — M8580 Other specified disorders of bone density and structure, unspecified site: Secondary | ICD-10-CM

## 2023-12-23 DIAGNOSIS — M898X1 Other specified disorders of bone, shoulder: Secondary | ICD-10-CM

## 2023-12-23 DIAGNOSIS — R55 Syncope and collapse: Secondary | ICD-10-CM | POA: Diagnosis not present

## 2023-12-23 DIAGNOSIS — E559 Vitamin D deficiency, unspecified: Secondary | ICD-10-CM

## 2023-12-23 DIAGNOSIS — T781XXA Other adverse food reactions, not elsewhere classified, initial encounter: Secondary | ICD-10-CM | POA: Insufficient documentation

## 2023-12-23 DIAGNOSIS — E538 Deficiency of other specified B group vitamins: Secondary | ICD-10-CM | POA: Diagnosis not present

## 2023-12-23 MED ORDER — FAMOTIDINE 40 MG PO TABS: 40.0000 mg | ORAL_TABLET | Freq: Every day | ORAL | 3 refills | Status: AC

## 2023-12-23 MED ORDER — TRETINOIN 0.05 % EX CREA: 1.0000 | TOPICAL_CREAM | Freq: Every day | CUTANEOUS | 3 refills | Status: DC

## 2023-12-23 NOTE — Progress Notes (Signed)
 Subjective:  Patient ID: [name], female    DOB: 01/15/1951  Age: 73 y.o. MRN: [id number]  CC: Shoulder Pain   HPI [name] presents for R shoulder blade pain, chest pain Exercising a lot (was out for 2 wks due to R scapular area SCC (removed) [name] would like me to check her vitamin levels  Outpatient Medications Prior to Visit  Medication Sig Dispense Refill   Botulinum Toxin Type A, Cosm, 50 units SOLR Inject 4-6 Units into the muscle. 3 month  injection     Cyanocobalamin  (B-12) 1000 MCG SUBL PLACE 1 TABLET (1,000 MCG TOTAL) UNDER THE TONGUE DAILY. 100 tablet 3   DEXILANT  60 MG capsule TAKE 1 CAPSULE BY MOUTH EVERY DAY 30 capsule 11   EPIPEN 2-PAK 0.3 MG/0.3ML SOAJ injection See admin instructions. Reported on 10/08/2015  1   EUCRISA 2 % OINT Apply topically. Apply 1 to 2 times a day     loratadine  (CLARITIN ) 10 MG tablet Take 1 tablet (10 mg total) by mouth daily. 100 tablet 3   lubiprostone  (AMITIZA ) 8 MCG capsule TAKE 1 CAPSULE (8 MCG TOTAL) BY MOUTH 2 (TWO) TIMES DAILY WITH A MEAL. 180 capsule 1   mometasone  (NASONEX ) 50 MCG/ACT nasal spray USE 2 SPRAYS IN EACH NOSTRIL EVERY DAY 51 each 1   NUVESSA  1.3 % GEL SMARTSIG:1 unspecified Vaginal Once     triamcinolone  ointment (KENALOG ) 0.1 % Apply topically 2 (two) times daily. 160 g 1   UNABLE TO FIND once a week. Med Name: allergy shots     valACYclovir  (VALTREX ) 500 MG tablet TAKE 1 TABLET BY MOUTH TWICE A DAY 10 tablet 2   VITAMIN D -VITAMIN K PO Take by mouth.     famotidine  (PEPCID ) 40 MG tablet TAKE 1 TABLET 1 HOUR BEFORE BED 90 tablet 1   tretinoin  (RETIN-A ) 0.05 % cream Apply 1 application topically at bedtime.     cefdinir  (OMNICEF ) 300 MG capsule Take 1 capsule (300 mg total) by mouth 2 (two) times daily. (Patient not taking: Reported on 11/04/2023) 20 capsule 0   diltiazem  (CARDIZEM ) 30 MG tablet Take 1 tablet (30 mg total) by mouth every 6 (six) hours as needed. For elevated heart rates (Patient not  taking: Reported on 11/04/2023) 30 tablet 1   famotidine  (PEPCID ) 20 MG tablet Take 1 tablet 1 hour before bedtime. (Patient not taking: Reported on 11/04/2023) 30 tablet 2   HYDROcodone  bit-homatropine (HYCODAN) 5-1.5 MG/5ML syrup Take 5 mLs by mouth every 4 (four) hours as needed for cough. (Patient not taking: Reported on 11/04/2023) 240 mL 0   NONFORMULARY OR COMPOUNDED ITEM Estradiol  vaginal cream 0.02%, 1 ml syringe, place 1 ml per vagina at hs three times per week. Disp:  12, RF:  11.  [facility]. (Patient not taking: Reported on 11/04/2023) 12 each 12   oseltamivir  (TAMIFLU ) 75 MG capsule Take 1 capsule (75 mg total) by mouth 2 (two) times daily. (Patient not taking: Reported on 11/04/2023) 10 capsule 0   PREMARIN  vaginal cream Use PV as directed (Patient not taking: Reported on 11/04/2023) 30 g 5   timolol (TIMOPTIC) 0.25 % ophthalmic solution SMARTSIG:In Eye(s) (Patient not taking: Reported on 11/04/2023)     No facility-administered medications prior to visit.    ROS: Review of Systems  Constitutional:  Negative for activity change, appetite change, chills, fatigue and unexpected weight change.  HENT:  Negative for congestion, mouth sores and sinus pressure.   Eyes:  Negative for visual disturbance.  Respiratory:  Negative for cough and chest tightness.   Gastrointestinal:  Negative for abdominal pain and nausea.  Genitourinary:  Negative for difficulty urinating, frequency and vaginal pain.  Musculoskeletal:  Negative for back pain and gait problem.  Skin:  Negative for pallor and rash.  Neurological:  Negative for dizziness, tremors, weakness, numbness and headaches.  Psychiatric/Behavioral:  Negative for confusion and sleep disturbance.     Objective:  BP 118/70   Pulse 77   Temp 98.3 F (36.8 C) (Oral)   Ht 1' (0.305 m)   Wt 106 lb (48.1 kg)   SpO2 98%   BMI 517.54 kg/m   BP Readings from Last 3 Encounters:  12/23/23 118/70  11/04/23 122/82  10/25/23 104/62     Wt Readings from Last 3 Encounters:  12/23/23 106 lb (48.1 kg)  10/25/23 102 lb 12.8 oz (46.6 kg)  10/10/23 102 lb (46.3 kg)    Physical Exam Constitutional:      General: She is not in acute distress.    Appearance: Normal appearance. She is well-developed.  HENT:     Head: Normocephalic.     Right Ear: External ear normal.     Left Ear: External ear normal.     Nose: Nose normal.  Eyes:     General:        Right eye: No discharge.        Left eye: No discharge.     Conjunctiva/sclera: Conjunctivae normal.     Pupils: Pupils are equal, round, and reactive to light.  Neck:     Thyroid : No thyromegaly.     Vascular: No JVD.     Trachea: No tracheal deviation.  Cardiovascular:     Rate and Rhythm: Normal rate and regular rhythm.     Heart sounds: Normal heart sounds.  Pulmonary:     Effort: No respiratory distress.     Breath sounds: No stridor. No wheezing.  Abdominal:     General: Bowel sounds are normal. There is no distension.     Palpations: Abdomen is soft. There is no mass.     Tenderness: There is no abdominal tenderness. There is no guarding or rebound.  Musculoskeletal:        General: No tenderness.     Cervical back: Normal range of motion and neck supple. No rigidity.  Lymphadenopathy:     Cervical: No cervical adenopathy.  Skin:    Findings: No erythema or rash.  Neurological:     Cranial Nerves: No cranial nerve deficit.     Motor: No abnormal muscle tone.     Coordination: Coordination normal.     Deep Tendon Reflexes: Reflexes normal.  Psychiatric:        Behavior: Behavior normal.        Thought Content: Thought content normal.        Judgment: Judgment normal.   Chest wall, spine nontender  Lab Results  Component Value Date   WBC 3.9 (L) 02/27/2023   HGB 12.9 02/27/2023   HCT 39.0 02/27/2023   PLT 191.0 02/27/2023   GLUCOSE 87 02/27/2023   CHOL 205 (H) 02/27/2023   TRIG 80.0 02/27/2023   HDL 77.50 02/27/2023   LDLCALC 112 (H)  02/27/2023   ALT 14 02/27/2023   AST 18 02/27/2023   NA 140 02/27/2023   K 3.8 02/27/2023   CL 103 02/27/2023   CREATININE 0.86 02/27/2023   BUN 15 02/27/2023   CO2 31 02/27/2023   TSH 2.21 02/27/2023  No results found.  Assessment & Plan:   Problem List Items Addressed This Visit     Palpitations   No relapse      Vasovagal syncope   Remote.  No relapse Obtain blood work, see orders      Relevant Orders   Vitamin A  (Completed)   Vitamin B5(Pantothenic Acid) (Completed)   Vitamin E (Completed)   Vitamin B1 (Completed)   Vitamin B12 (Completed)   VITAMIN D  25 Hydroxy (Vit-D Deficiency, Fractures) (Completed)   Vitamin B6 (Completed)   Osteopenia   Cont D3 and K2, TUMS Not using Miacalcin      Relevant Orders   VITAMIN D  25 Hydroxy (Vit-D Deficiency, Fractures) (Completed)   B12 deficiency - Primary   Continue on vitamin B12 obtain lab work with B12 level and other vitamin B group vitamins per Gradie's request      Relevant Orders   Vitamin B12 (Completed)   Shoulder blade pain   Likely musculoskeletal.  It seems to be improving Labs were ordered      Other Visit Diagnoses       Vitamin D  deficiency       Relevant Orders   VITAMIN D  25 Hydroxy (Vit-D Deficiency, Fractures) (Completed)         Meds ordered this encounter  Medications   famotidine  (PEPCID ) 40 MG tablet    Sig: Take 1 tablet (40 mg total) by mouth daily.    Dispense:  90 tablet    Refill:  3   tretinoin  (RETIN-A ) 0.05 % cream    Sig: Apply 1 application  topically at bedtime.    Dispense:  45 g    Refill:  3    Brand only      Follow-up: Return in about 3 months (around 03/24/2024) for a follow-up visit.  Marolyn Noel, MD

## 2023-12-24 ENCOUNTER — Other Ambulatory Visit: Admitting: Obstetrics and Gynecology

## 2023-12-24 ENCOUNTER — Other Ambulatory Visit

## 2023-12-25 ENCOUNTER — Telehealth: Payer: Self-pay

## 2023-12-25 ENCOUNTER — Ambulatory Visit: Admitting: Internal Medicine

## 2023-12-25 ENCOUNTER — Other Ambulatory Visit (INDEPENDENT_AMBULATORY_CARE_PROVIDER_SITE_OTHER)

## 2023-12-25 DIAGNOSIS — E538 Deficiency of other specified B group vitamins: Secondary | ICD-10-CM

## 2023-12-25 DIAGNOSIS — M8580 Other specified disorders of bone density and structure, unspecified site: Secondary | ICD-10-CM

## 2023-12-25 DIAGNOSIS — R55 Syncope and collapse: Secondary | ICD-10-CM | POA: Diagnosis not present

## 2023-12-25 DIAGNOSIS — E559 Vitamin D deficiency, unspecified: Secondary | ICD-10-CM | POA: Diagnosis not present

## 2023-12-25 LAB — VITAMIN D 25 HYDROXY (VIT D DEFICIENCY, FRACTURES): VITD: 111.51 ng/mL (ref 30.00–100.00)

## 2023-12-25 LAB — VITAMIN B12: Vitamin B-12: 783 pg/mL (ref 211–911)

## 2023-12-25 NOTE — Telephone Encounter (Signed)
 CRITICAL VALUE STICKER  CRITICAL VALUE: Vitamin D  111.51  RECEIVER (on-site recipient of call): Ronnald  DATE & TIME NOTIFIED: 12/25/23 @ 11:24 am  MESSENGER (representative from lab): Delcia  MD NOTIFIED: Yes  TIME OF NOTIFICATION: 11: 29 am  RESPONSE:  To tell pt to stop taking Vitamin D   Called and made her aware of this. Pt verbalized understanding

## 2023-12-26 ENCOUNTER — Other Ambulatory Visit: Payer: Self-pay | Admitting: Internal Medicine

## 2023-12-26 ENCOUNTER — Encounter: Payer: Self-pay | Admitting: Internal Medicine

## 2023-12-26 ENCOUNTER — Telehealth: Payer: Self-pay

## 2023-12-26 ENCOUNTER — Ambulatory Visit: Payer: Self-pay | Admitting: Internal Medicine

## 2023-12-26 DIAGNOSIS — E559 Vitamin D deficiency, unspecified: Secondary | ICD-10-CM

## 2023-12-26 DIAGNOSIS — T452X1A Poisoning by vitamins, accidental (unintentional), initial encounter: Secondary | ICD-10-CM

## 2023-12-26 NOTE — Telephone Encounter (Signed)
 Spoke to pt.. refurb orthotics are here pt can pu no appt needed..   No charge

## 2023-12-27 DIAGNOSIS — J3081 Allergic rhinitis due to animal (cat) (dog) hair and dander: Secondary | ICD-10-CM | POA: Diagnosis not present

## 2023-12-27 DIAGNOSIS — J3089 Other allergic rhinitis: Secondary | ICD-10-CM | POA: Diagnosis not present

## 2023-12-27 DIAGNOSIS — J301 Allergic rhinitis due to pollen: Secondary | ICD-10-CM | POA: Diagnosis not present

## 2023-12-29 DIAGNOSIS — M898X1 Other specified disorders of bone, shoulder: Secondary | ICD-10-CM | POA: Insufficient documentation

## 2023-12-29 LAB — VITAMIN E
Gamma-Tocopherol (Vit E): <1 mg/L (ref <4.4)
Vitamin E (Alpha Tocopherol): 15.9 mg/L (ref 5.7–19.9)

## 2023-12-29 LAB — VITAMIN B5 (PANTOTHENIC ACID): Vitamin B5(Pantothenic Acid): 46 ng/mL (ref <275)

## 2023-12-29 LAB — VITAMIN B1: Vitamin B1 (Thiamine): 15 nmol/L (ref 8–30)

## 2023-12-29 LAB — VITAMIN A: Vitamin A (Retinoic Acid): 53 ug/dL (ref 38–98)

## 2023-12-29 LAB — VITAMIN B6: Vitamin B6: 12 ng/mL (ref 2.1–21.7)

## 2023-12-29 NOTE — Assessment & Plan Note (Addendum)
 Likely musculoskeletal.  It seems to be improving Labs were ordered

## 2023-12-29 NOTE — Assessment & Plan Note (Signed)
 Remote.  No relapse Obtain blood work, see orders

## 2023-12-29 NOTE — Assessment & Plan Note (Addendum)
 Continue on vitamin B12 obtain lab work with B12 level and other vitamin B group vitamins per Deania's request

## 2023-12-29 NOTE — Assessment & Plan Note (Signed)
Cont D3 and K2, TUMS Not using Miacalcin

## 2023-12-30 ENCOUNTER — Ambulatory Visit: Admitting: Internal Medicine

## 2024-01-02 DIAGNOSIS — J3081 Allergic rhinitis due to animal (cat) (dog) hair and dander: Secondary | ICD-10-CM | POA: Diagnosis not present

## 2024-01-02 DIAGNOSIS — J301 Allergic rhinitis due to pollen: Secondary | ICD-10-CM | POA: Diagnosis not present

## 2024-01-02 DIAGNOSIS — J3089 Other allergic rhinitis: Secondary | ICD-10-CM | POA: Diagnosis not present

## 2024-01-06 DIAGNOSIS — H43813 Vitreous degeneration, bilateral: Secondary | ICD-10-CM | POA: Diagnosis not present

## 2024-01-06 NOTE — Assessment & Plan Note (Signed)
No relapse 

## 2024-01-08 DIAGNOSIS — J3081 Allergic rhinitis due to animal (cat) (dog) hair and dander: Secondary | ICD-10-CM | POA: Diagnosis not present

## 2024-01-08 DIAGNOSIS — J301 Allergic rhinitis due to pollen: Secondary | ICD-10-CM | POA: Diagnosis not present

## 2024-01-08 DIAGNOSIS — J3089 Other allergic rhinitis: Secondary | ICD-10-CM | POA: Diagnosis not present

## 2024-01-09 ENCOUNTER — Ambulatory Visit: Admitting: Internal Medicine

## 2024-01-10 ENCOUNTER — Telehealth: Payer: Self-pay | Admitting: Internal Medicine

## 2024-01-10 NOTE — Telephone Encounter (Signed)
 Patient is advised that she should take Dexilant  60 mg daily rather than 3 times per week (daily rx is being prescribed by Dr Garald). Advised when we saw her in office 02/2022, she had previously tried to decrease Dexilant  but had return of laropharyngeal reflux, GERD and difficulty breathing and was advised she needed to increase PPI. Patient verbalizes understanding.

## 2024-01-10 NOTE — Telephone Encounter (Signed)
 Inbound call from patient stating she would like to speak to nurse in regards to a medication she's been taking. Patient states she's been taking medication Dexilant  and needs to take medication daily but she's been taking it 3 times a week instead. Mon-wed-fri Patient wants to know if that is safe to take only 3 days  Requesting a call back   Please advise  Thank you

## 2024-01-11 ENCOUNTER — Encounter (HOSPITAL_COMMUNITY): Payer: Self-pay

## 2024-01-11 ENCOUNTER — Ambulatory Visit (HOSPITAL_COMMUNITY)
Admission: RE | Admit: 2024-01-11 | Discharge: 2024-01-11 | Disposition: A | Discharge status: Home / Self Care | Source: Ambulatory Visit | Attending: Internal Medicine | Admitting: Internal Medicine

## 2024-01-11 VITALS — BP 157/81 | HR 98 | Temp 100.2°F | Resp 17

## 2024-01-11 DIAGNOSIS — U071 COVID-19: Secondary | ICD-10-CM

## 2024-01-11 LAB — POC SARS CORONAVIRUS 2 AG -  ED: SARS Coronavirus 2 Ag: POSITIVE — AB

## 2024-01-11 MED ORDER — PAXLOVID (300/100) 20 X 150 MG & 10 X 100MG PO TBPK: 3.0000 | ORAL_TABLET | Freq: Two times a day (BID) | ORAL | 0 refills | Status: AC

## 2024-01-11 MED ORDER — PROMETHAZINE-DM 6.25-15 MG/5ML PO SYRP: 5.0000 mL | ORAL_SOLUTION | Freq: Every evening | ORAL | 0 refills | Status: DC | PRN

## 2024-01-11 MED ORDER — PAXLOVID (300/100) 20 X 150 MG & 10 X 100MG PO TBPK: 3.0000 | ORAL_TABLET | Freq: Two times a day (BID) | ORAL | 0 refills | Status: DC

## 2024-01-11 NOTE — ED Triage Notes (Addendum)
 Pt c/o sore throat, fatigue, and body aches for 1 day. She took at home covid which had faint postive today  Pt husband had positive covid test  She would like paper prescription

## 2024-01-11 NOTE — ED Provider Notes (Addendum)
 [facility]    CSN: [id number] Arrival date & time: 01/11/24  1347      History   Chief Complaint Chief Complaint  Patient presents with   Sore Throat    Entered by patient    HPI [name] is a 73 y.o. female.   [name] is a 73 y.o. female presenting for chief complaint of sore throat, fever, chills, and bodyaches that started yesterday.  Her husband tested positive for COVID-19 yesterday.  She had an at home test this morning that was faintly positive for herself.  She denies cough, shortness of breath, chest pain, nausea, vomiting, diarrhea, abdominal pain, rash, and leg swelling.  She has never been a smoker and denies drug use.  Denies history of chronic respiratory problems.  She has never had COVID-19 in the past. She has not attempted use of any OTC medications for symptoms PTA.    Sore Throat    Past Medical History:  Diagnosis Date   Allergic rhinitis, cause unspecified    Backache, unspecified    Benign fasciculation-cramp syndrome 12/03/2012    Worsening with  stress, fatigue .   Calculus of kidney    Conversion disorder    Cotton wool spots    Esophageal reflux    Floater, vitreous, left 05/2016   GERD (gastroesophageal reflux disease)    Hemorrhage of rectum and anus    Internal hemorrhoids without mention of complication    Irritable bowel syndrome    Microscopic hematuria    Migraine headache with aura    Mild aortic insufficiency    Mild tricuspid regurgitation    Osteopenia    Other plastic surgery for unacceptable cosmetic appearance    Inj tx fllers/expander   Personal history of urinary calculi    Postmenopausal atrophic vaginitis    Premature atrial contractions    PVC (premature ventricular contraction)    a. Event monitor 2013.   Unspecified constipation    Vasovagal syncope     Patient Active Problem List   Diagnosis Date Noted   Shoulder blade pain 12/29/2023   Adverse reaction to food 12/23/2023    Upper respiratory infection 09/02/2023   Fibrocystic breast changes 04/18/2022   Osteoporosis 04/16/2022   Dizziness 02/22/2022   Toothache 12/20/2021   Gluteal tendinitis of right buttock 03/16/2021   Anxiety 11/03/2020   Allergic rhinitis due to animal hair and dander 06/29/2020   Allergic rhinitis due to pollen 06/29/2020   Stress 05/30/2020   Thoracic spine pain 02/17/2020   Herpes zoster 09/17/2019   Tachycardia 08/10/2019   Vaccination complication 08/10/2019   Adverse effect of COVID-19 vaccine 07/20/2019   Myoneural disorder, unspecified (HCC) 04/09/2019   Sore throat 06/13/2018   Friction injury to skin 04/14/2018   Occipital neuralgia 03/03/2018   B12 deficiency 03/03/2018   Bilateral impacted cerumen 10/21/2017   Excessive cerumen in both ear canals 10/21/2017   LLQ abdominal pain 09/23/2017   Elevated antinuclear antibody (ANA) level 07/26/2017   Arthralgia 07/26/2017   Degenerative disc disease, lumbar 07/04/2017   Cough 06/06/2017   Chest discomfort 03/21/2017   Neck mass 01/17/2017   Snoring 12/18/2016   Medial tibial stress syndrome, left, initial encounter 08/14/2016   Patellofemoral syndrome of both knees 08/09/2016   Drug allergy, antibiotic 05/31/2016   Hyperkalemia 02/17/2016   Vertical diplopia 02/15/2016   Tension headache 12/28/2015   BPPV (benign paroxysmal positional vertigo) 10/08/2015   TMJ click 09/13/2015   Cold sore 07/18/2015   Osteopenia 07/18/2015  Earache on left 07/18/2015   Cervical disc disorder with radiculopathy of cervical region 06/17/2015   Cotton wool spots 03/15/2015   Low back pain 03/08/2015   Food poisoning 03/08/2015   Generalized anxiety disorder 03/08/2015   Dysuria 02/23/2015   Well adult exam 09/09/2014   Internal hemorrhoids 09/07/2014   Allergic urticaria 08/20/2014   Acute sinusitis 08/18/2014   Neck muscle spasm 07/30/2014   Grief 06/17/2014   Mild aortic insufficiency    Mild tricuspid regurgitation     Vasovagal syncope    Premature atrial contractions    GERD (gastroesophageal reflux disease)    PVC (premature ventricular contraction)    Dense breast tissue on mammogram 03/23/2014   Microhematuria 03/22/2014   Changing skin lesion 02/05/2014   Rash and nonspecific skin eruption 12/22/2013   Chronic meniscal tear of knee 12/21/2013   Benign fasciculations 12/11/2013   Strain of adductor magnus muscle of left lower extremity 05/21/2013   Benign fasciculation-cramp syndrome 12/03/2012   Vasovagal near syncope 11/02/2012   Migraine with aura 11/02/2012   Upper airway cough syndrome 08/16/2012   Bunion of great toe 09/11/2011   Routine health maintenance 09/11/2011   Mild mitral regurgitation by prior echocardiogram 05/15/2011   Chronic neck pain 03/07/2011   Skin lesion 03/07/2011   DYSPLASTIC NEVUS, FACE 02/06/2010   HAND PAIN, BILATERAL 08/25/2009   Aortic valve disorder 08/02/2009   Abnormal involuntary movement 07/28/2009   Abnormal involuntary movement 07/28/2009   LUMBAR SPRAIN AND STRAIN 05/21/2009   Palpitations 02/03/2009   Internal hemorrhoids 12/15/2008   Constipation 12/15/2008   IBS (irritable bowel syndrome) 12/15/2008   RECTAL BLEEDING 12/15/2008   RENAL CALCULUS, HX OF 11/29/2007   Herpes simplex virus (HSV) infection 11/03/2007   Vaginal atrophy 03/25/2007   GLOBUS HYSTERICUS 01/01/2007   Allergic rhinitis 01/01/2007    Past Surgical History:  Procedure Laterality Date   BREAST BIOPSY Right    Cosmetic Procedures w/Injection therapy     SKIN CANCER EXCISION Left    thigh   WISDOM TOOTH EXTRACTION      OB History   No obstetric history on file.      Home Medications    Prior to Admission medications   Medication Sig Start Date End Date Taking? Authorizing Provider  promethazine -dextromethorphan (PROMETHAZINE -DM) 6.25-15 MG/5ML syrup Take 5 mLs by mouth at bedtime as needed for cough. 01/11/24  Yes Enedelia Dorna HERO, FNP  Botulinum Toxin Type  A, Cosm, 50 units SOLR Inject 4-6 Units into the muscle. 3 month  injection    [provider]  Cyanocobalamin  (B-12) 1000 MCG SUBL PLACE 1 TABLET (1,000 MCG TOTAL) UNDER THE TONGUE DAILY. 04/18/22   Plotnikov, Aleksei V, MD  DEXILANT  60 MG capsule TAKE 1 CAPSULE BY MOUTH EVERY DAY 12/27/22   Plotnikov, Aleksei V, MD  EPIPEN 2-PAK 0.3 MG/0.3ML SOAJ injection See admin instructions. Reported on 10/08/2015 06/30/14   [provider]  EUCRISA 2 % OINT Apply topically. Apply 1 to 2 times a day 09/02/20   [provider]  famotidine  (PEPCID ) 40 MG tablet Take 1 tablet (40 mg total) by mouth daily. 12/23/23   Plotnikov, Karlynn GAILS, MD  loratadine  (CLARITIN ) 10 MG tablet Take 1 tablet (10 mg total) by mouth daily. 04/18/22   Plotnikov, Aleksei V, MD  lubiprostone  (AMITIZA ) 8 MCG capsule TAKE 1 CAPSULE (8 MCG TOTAL) BY MOUTH 2 (TWO) TIMES DAILY WITH A MEAL. 11/04/23   Pyrtle, Gordy HERO, MD  mometasone  (NASONEX ) 50 MCG/ACT nasal spray  USE 2 SPRAYS IN EACH NOSTRIL EVERY DAY 11/13/22   Plotnikov, Aleksei V, MD  nirmatrelvir/ritonavir (PAXLOVID , 300/100,) 20 x 150 MG & 10 x 100MG  TBPK Take 3 tablets by mouth 2 (two) times daily for 5 days. Patient GFR is >60. Take nirmatrelvir (150 mg) two tablets twice daily for 5 days and ritonavir (100 mg) one tablet twice daily for 5 days. 01/11/24 01/16/24  Enedelia Dorna HERO, FNP  NUVESSA  1.3 % GEL SMARTSIG:1 unspecified Vaginal Once 10/25/23   [provider]  tretinoin  (RETIN-A ) 0.05 % cream Apply 1 application  topically at bedtime. 12/23/23   Plotnikov, Aleksei V, MD  triamcinolone  ointment (KENALOG ) 0.1 % Apply topically 2 (two) times daily. 12/20/21   Plotnikov, Aleksei V, MD  UNABLE TO FIND once a week. Med Name: allergy shots    [provider]  valACYclovir  (VALTREX ) 500 MG tablet TAKE 1 TABLET BY MOUTH TWICE A DAY 10/21/23   Plotnikov, Aleksei V, MD  VITAMIN D -VITAMIN K PO Take by mouth.    [provider]    Family  History Family History  Problem Relation Age of Onset   Hyperlipidemia Mother    Hypertension Mother    Hyperlipidemia Father    COPD Father        Smoker, deceased 28-May-2014   Hypertension Father    Hypertension Sister    Hyperlipidemia Sister    Headache Sister    Diabetes Neg Hx    Colon cancer Neg Hx    Breast cancer Neg Hx    Coronary artery disease Neg Hx    Esophageal cancer Neg Hx    Stomach cancer Neg Hx    Pancreatic cancer Neg Hx    Liver disease Neg Hx     Social History Social History   Tobacco Use   Smoking status: Never    Passive exposure: Past   Smokeless tobacco: Never  Vaping Use   Vaping status: Never Used  Substance Use Topics   Alcohol use: No   Drug use: No     Allergies   Codeine, Doxycycline , Epinephrine, Latex, Neomycin, Penicillins, Promethazine , Protonix  [pantoprazole  sodium], Sulfasalazine, Sulfonamide derivatives, and Zithromax  [azithromycin ]   Review of Systems Review of Systems Per HPI  Physical Exam Triage Vital Signs ED Triage Vitals  Encounter Vitals Group     BP 01/11/24 1412 (!) 157/81     Girls Systolic BP Percentile --      Girls Diastolic BP Percentile --      Boys Systolic BP Percentile --      Boys Diastolic BP Percentile --      Pulse Rate 01/11/24 1412 98     Resp 01/11/24 1412 17     Temp 01/11/24 1412 100.2 F (37.9 C)     Temp Source 01/11/24 1412 Oral     SpO2 01/11/24 1412 96 %     Weight --      Height --      Head Circumference --      Peak Flow --      Pain Score 01/11/24 1414 3     Pain Loc --      Pain Education --      Exclude from Growth Chart --    No data found.  Updated Vital Signs BP (!) 157/81 (BP Location: Right Arm)   Pulse 98   Temp 100.2 F (37.9 C) (Oral)   Resp 17   SpO2 96%   Visual Acuity Right Eye Distance:   Left  Eye Distance:   Bilateral Distance:    Right Eye Near:   Left Eye Near:    Bilateral Near:     Physical Exam Vitals and nursing note reviewed.   Constitutional:      Appearance: She is not ill-appearing or toxic-appearing.  HENT:     Head: Normocephalic and atraumatic.     Right Ear: Hearing, tympanic membrane, ear canal and external ear normal.     Left Ear: Hearing, tympanic membrane, ear canal and external ear normal.     Nose: Nose normal.     Mouth/Throat:     Lips: Pink.     Mouth: Mucous membranes are moist. No injury or oral lesions.     Dentition: Normal dentition.     Tongue: No lesions.     Pharynx: Oropharynx is clear. Uvula midline. No pharyngeal swelling, oropharyngeal exudate, posterior oropharyngeal erythema, uvula swelling or postnasal drip.     Tonsils: No tonsillar exudate.  Eyes:     General: Lids are normal. Vision grossly intact. Gaze aligned appropriately.     Extraocular Movements: Extraocular movements intact.     Conjunctiva/sclera: Conjunctivae normal.  Neck:     Trachea: Trachea and phonation normal.  Cardiovascular:     Rate and Rhythm: Normal rate and regular rhythm.     Heart sounds: Normal heart sounds, S1 normal and S2 normal.  Pulmonary:     Effort: Pulmonary effort is normal. No respiratory distress.     Breath sounds: Normal breath sounds and air entry.  Musculoskeletal:     Cervical back: Neck supple.  Lymphadenopathy:     Cervical: No cervical adenopathy.  Skin:    General: Skin is warm and dry.     Capillary Refill: Capillary refill takes less than 2 seconds.     Findings: No rash.  Neurological:     General: No focal deficit present.     Mental Status: She is alert and oriented to person, place, and time. Mental status is at baseline.     Cranial Nerves: No dysarthria or facial asymmetry.  Psychiatric:        Mood and Affect: Mood normal.        Speech: Speech normal.        Behavior: Behavior normal.        Thought Content: Thought content normal.        Judgment: Judgment normal.      UC Treatments / Results  Labs (all labs ordered are listed, but only abnormal  results are displayed) Labs Reviewed  POC SARS CORONAVIRUS 2 AG -  ED - Abnormal; Notable for the following components:      Result Value   SARS Coronavirus 2 Ag Positive (*)    All other components within normal limits    EKG   Radiology No results found.  Procedures Procedures (including critical care time)  Medications Ordered in UC Medications - No data to display  Initial Impression / Assessment and Plan / UC Course  I have reviewed the triage vital signs and the nursing notes.  Pertinent labs & imaging results that were available during my care of the patient were reviewed by me and considered in my medical decision making (see chart for details).   1.  COVID-19 virus infection COVID testing positive at home and in clinic.   Lungs clear, vitals hemodynamically stable, therefore deferred imaging of the chest.  Patient is a candidate for antiviral (paxlovid ) given risk factors for severe COVID-19 illness.  Antiviral sent to pharmacy. Most recent GFR greater than 60 based on blood work from 02/2023.  Daily medications checked for interactions with [place] medication checker tool prior to prescribing.  No medications needed to hold while taking antiviral, no interactions. Allergy to promethazine  is documented in the chart, patient has had Promethazine  DM cough syrup without allergic reaction in the past (3 months ago) and requests a prescription for this medication today to help with cough at bedtime should she develop one with COVID.   Discussed most up to date CDC guidelines regarding quarantine and masking to prevent transmission.  Recommend supportive care for symptomatic relief as outlined in AVS.    Final Clinical Impressions(s) / UC Diagnoses   Final diagnoses:  COVID-19 virus infection     Discharge Instructions      You have COVID-19 viral illness. Wear a mask for 5 days of symptoms. You may return to public within those 5 days as long as you do not have  a fever. Wash hands frequently.  Take paxlovid  as prescribed. Start medicine tonight. 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  - Take prescribed medicines to help with symptoms: tessalon  perles, promethazine  DM (drowsiness precautions with cough syrup, only take at bedtime) - Use over the counter medicines to help with symptoms as discussed: Tylenol, guaifenesin (mucinex), zyrtec, etc - Two teaspoons of honey in warm water every 4-6 hours may help with throat pains - Humidifier in your room at night to help add water the air and soothe cough  If you develop any new or worsening symptoms or do not improve in the next 2 to 3 days, please return.  If your symptoms are severe, please go to the emergency room.  Follow-up with PCP as needed.     ED Prescriptions     Medication Sig Dispense Auth. Provider   nirmatrelvir/ritonavir (PAXLOVID , 300/100,) 20 x 150 MG & 10 x 100MG  TBPK  (Status: Discontinued) Take 3 tablets by mouth 2 (two) times daily for 5 days. Patient GFR is >60. Take nirmatrelvir (150 mg) two tablets twice daily for 5 days and ritonavir (100 mg) one tablet twice daily for 5 days. 30 tablet Lexxie Winberg M, FNP   nirmatrelvir/ritonavir (PAXLOVID , 300/100,) 20 x 150 MG & 10 x 100MG  TBPK Take 3 tablets by mouth 2 (two) times daily for 5 days. Patient GFR is >60. Take nirmatrelvir (150 mg) two tablets twice daily for 5 days and ritonavir (100 mg) one tablet twice daily for 5 days. 30 tablet Enedelia Dorna HERO, FNP   promethazine -dextromethorphan (PROMETHAZINE -DM) 6.25-15 MG/5ML syrup Take 5 mLs by mouth at bedtime as needed for cough. 118 mL Enedelia Dorna HERO, FNP      PDMP not reviewed this encounter.   Enedelia Dorna HERO, FNP 01/11/24 1515    Enedelia Dorna HERO, [place] 01/11/24 [phone]

## 2024-01-11 NOTE — Discharge Instructions (Signed)
You have COVID-19 viral illness. Wear a mask for 5 days of symptoms. You may return to public within those 5 days as long as you do not have a fever. Wash hands frequently.  Take paxlovid as prescribed. Start medicine tonight. 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  - Take prescribed medicines to help with symptoms: tessalon perles, promethazine DM (drowsiness precautions with cough syrup, only take at bedtime) - Use over the counter medicines to help with symptoms as discussed: Tylenol, guaifenesin (mucinex), zyrtec, etc - Two teaspoons of honey in warm water every 4-6 hours may help with throat pains - Humidifier in your room at night to help add water the air and soothe cough  If you develop any new or worsening symptoms or do not improve in the next 2 to 3 days, please return.  If your symptoms are severe, please go to the emergency room.  Follow-up with PCP as needed.

## 2024-01-15 ENCOUNTER — Ambulatory Visit: Admitting: Internal Medicine

## 2024-01-16 ENCOUNTER — Ambulatory Visit (INDEPENDENT_AMBULATORY_CARE_PROVIDER_SITE_OTHER): Admitting: Internal Medicine

## 2024-01-16 ENCOUNTER — Ambulatory Visit: Admitting: Internal Medicine

## 2024-01-16 ENCOUNTER — Encounter: Payer: Self-pay | Admitting: Internal Medicine

## 2024-01-16 VITALS — BP 136/78 | HR 72 | Temp 98.6°F | Ht 61.0 in | Wt 101.0 lb

## 2024-01-16 DIAGNOSIS — Z87898 Personal history of other specified conditions: Secondary | ICD-10-CM | POA: Insufficient documentation

## 2024-01-16 DIAGNOSIS — D1800 Hemangioma unspecified site: Secondary | ICD-10-CM | POA: Insufficient documentation

## 2024-01-16 DIAGNOSIS — Z0000 Encounter for general adult medical examination without abnormal findings: Secondary | ICD-10-CM

## 2024-01-16 DIAGNOSIS — L280 Lichen simplex chronicus: Secondary | ICD-10-CM | POA: Insufficient documentation

## 2024-01-16 DIAGNOSIS — U071 COVID-19: Secondary | ICD-10-CM | POA: Diagnosis not present

## 2024-01-16 DIAGNOSIS — E538 Deficiency of other specified B group vitamins: Secondary | ICD-10-CM | POA: Diagnosis not present

## 2024-01-16 DIAGNOSIS — L0100 Impetigo, unspecified: Secondary | ICD-10-CM | POA: Insufficient documentation

## 2024-01-16 DIAGNOSIS — Z85828 Personal history of other malignant neoplasm of skin: Secondary | ICD-10-CM | POA: Insufficient documentation

## 2024-01-16 DIAGNOSIS — E559 Vitamin D deficiency, unspecified: Secondary | ICD-10-CM | POA: Diagnosis not present

## 2024-01-16 DIAGNOSIS — L719 Rosacea, unspecified: Secondary | ICD-10-CM | POA: Insufficient documentation

## 2024-01-16 DIAGNOSIS — Z808 Family history of malignant neoplasm of other organs or systems: Secondary | ICD-10-CM | POA: Insufficient documentation

## 2024-01-16 DIAGNOSIS — D2330 Other benign neoplasm of skin of unspecified part of face: Secondary | ICD-10-CM | POA: Insufficient documentation

## 2024-01-16 DIAGNOSIS — L821 Other seborrheic keratosis: Secondary | ICD-10-CM | POA: Insufficient documentation

## 2024-01-16 DIAGNOSIS — D225 Melanocytic nevi of trunk: Secondary | ICD-10-CM | POA: Insufficient documentation

## 2024-01-16 DIAGNOSIS — L111 Transient acantholytic dermatosis [Grover]: Secondary | ICD-10-CM | POA: Insufficient documentation

## 2024-01-16 NOTE — Assessment & Plan Note (Addendum)
 1st episode Finishing Paxlovid  today Discussed

## 2024-01-16 NOTE — Assessment & Plan Note (Signed)
Continue on vitamin B12

## 2024-01-16 NOTE — Progress Notes (Signed)
 Subjective:  Patient ID: [name], female    DOB: March 08, 1951  Age: 73 y.o. MRN: [id number]  CC: Medical Management of Chronic Issues (COVID pos f/u a/w sore throat)   HPI [name] presents for COVID f/u (ST, fever, fatigue).  F/u on elev Vit D - was taking Vit D 5000 international units/d with K2  Outpatient Medications Prior to Visit  Medication Sig Dispense Refill   Botulinum Toxin Type A, Cosm, 50 units SOLR Inject 4-6 Units into the muscle. 3 month  injection     Cyanocobalamin  (B-12) 1000 MCG SUBL PLACE 1 TABLET (1,000 MCG TOTAL) UNDER THE TONGUE DAILY. 100 tablet 3   DEXILANT  60 MG capsule TAKE 1 CAPSULE BY MOUTH EVERY DAY 30 capsule 11   EPIPEN 2-PAK 0.3 MG/0.3ML SOAJ injection See admin instructions. Reported on 10/08/2015  1   Estradiol  0.75 MG/1.25 GM (0.06%) topical gel 1 pump to skin to arm Transdermal Once a day     EUCRISA 2 % OINT Apply topically. Apply 1 to 2 times a day     famotidine  (PEPCID ) 40 MG tablet Take 1 tablet (40 mg total) by mouth daily. 90 tablet 3   loratadine  (CLARITIN ) 10 MG tablet Take 1 tablet (10 mg total) by mouth daily. 100 tablet 3   lubiprostone  (AMITIZA ) 8 MCG capsule TAKE 1 CAPSULE (8 MCG TOTAL) BY MOUTH 2 (TWO) TIMES DAILY WITH A MEAL. 180 capsule 1   Minoxidil (ROGAINE MENS EXTRA STRENGTH) 5 % FOAM 0.5 Applications.     mometasone  (NASONEX ) 50 MCG/ACT nasal spray USE 2 SPRAYS IN EACH NOSTRIL EVERY DAY 51 each 1   nirmatrelvir/ritonavir (PAXLOVID , 300/100,) 20 x 150 MG & 10 x 100MG  TBPK Take 3 tablets by mouth 2 (two) times daily for 5 days. Patient GFR is >60. Take nirmatrelvir (150 mg) two tablets twice daily for 5 days and ritonavir (100 mg) one tablet twice daily for 5 days. 30 tablet 0   NUVESSA  1.3 % GEL SMARTSIG:1 unspecified Vaginal Once     promethazine -dextromethorphan (PROMETHAZINE -DM) 6.25-15 MG/5ML syrup Take 5 mLs by mouth at bedtime as needed for cough. 118 mL 0   timolol (TIMOPTIC) 0.25 % ophthalmic solution 2-3  DROPS ONTO AFFECTED AREA OPHTHALMIC ONCE A DAY 30 DAYS; Duration: 30     tretinoin  (RETIN-A ) 0.05 % cream Apply 1 application  topically at bedtime. 45 g 3   triamcinolone  ointment (KENALOG ) 0.1 % Apply topically 2 (two) times daily. 160 g 1   UNABLE TO FIND once a week. Med Name: allergy shots     valACYclovir  (VALTREX ) 500 MG tablet TAKE 1 TABLET BY MOUTH TWICE A DAY 10 tablet 2   VITAMIN D -VITAMIN K PO Take by mouth.     No facility-administered medications prior to visit.    ROS: Review of Systems  Constitutional:  Positive for fatigue. Negative for activity change, appetite change, chills, fever and unexpected weight change.  HENT:  Positive for sore throat. Negative for congestion, mouth sores and sinus pressure.   Eyes:  Negative for visual disturbance.  Respiratory:  Positive for cough. Negative for chest tightness.   Gastrointestinal:  Negative for abdominal pain and nausea.  Genitourinary:  Negative for difficulty urinating, frequency and vaginal pain.  Musculoskeletal:  Negative for back pain and gait problem.  Skin:  Negative for pallor and rash.  Neurological:  Negative for dizziness, tremors, weakness, numbness and headaches.  Psychiatric/Behavioral:  Negative for confusion and sleep disturbance.     Objective:  BP 136/78  Pulse 72   Temp 98.6 F (37 C) (Oral)   Ht 5' 1 (1.549 m)   Wt 101 lb (45.8 kg)   SpO2 99%   BMI 19.08 kg/m   BP Readings from Last 3 Encounters:  01/16/24 136/78  01/11/24 (!) 157/81  12/23/23 118/70    Wt Readings from Last 3 Encounters:  01/16/24 101 lb (45.8 kg)  12/23/23 106 lb (48.1 kg)  10/25/23 102 lb 12.8 oz (46.6 kg)    Physical Exam Constitutional:      General: She is not in acute distress.    Appearance: Normal appearance. She is well-developed.  HENT:     Head: Normocephalic.     Right Ear: External ear normal. There is no impacted cerumen.     Left Ear: External ear normal. There is no impacted cerumen.     Nose:  Nose normal. No congestion.     Mouth/Throat:     Pharynx: Posterior oropharyngeal erythema present. No oropharyngeal exudate.  Eyes:     General:        Right eye: No discharge.        Left eye: No discharge.     Conjunctiva/sclera: Conjunctivae normal.     Pupils: Pupils are equal, round, and reactive to light.  Neck:     Thyroid : No thyromegaly.     Vascular: No JVD.     Trachea: No tracheal deviation.  Cardiovascular:     Rate and Rhythm: Normal rate and regular rhythm.     Heart sounds: Normal heart sounds.  Pulmonary:     Effort: No respiratory distress.     Breath sounds: No stridor. No wheezing.  Abdominal:     General: Bowel sounds are normal. There is no distension.     Palpations: Abdomen is soft. There is no mass.     Tenderness: There is no abdominal tenderness. There is no guarding or rebound.  Musculoskeletal:        General: No tenderness.     Cervical back: Normal range of motion and neck supple. No rigidity.  Lymphadenopathy:     Cervical: No cervical adenopathy.  Skin:    Findings: No erythema or rash.  Neurological:     Cranial Nerves: No cranial nerve deficit.     Motor: No abnormal muscle tone.     Coordination: Coordination normal.     Deep Tendon Reflexes: Reflexes normal.  Psychiatric:        Behavior: Behavior normal.        Thought Content: Thought content normal.        Judgment: Judgment normal.   Eryth throat  Lab Results  Component Value Date   WBC 3.9 (L) 02/27/2023   HGB 12.9 02/27/2023   HCT 39.0 02/27/2023   PLT 191.0 02/27/2023   GLUCOSE 87 02/27/2023   CHOL 205 (H) 02/27/2023   TRIG 80.0 02/27/2023   HDL 77.50 02/27/2023   LDLCALC 112 (H) 02/27/2023   ALT 14 02/27/2023   AST 18 02/27/2023   NA 140 02/27/2023   K 3.8 02/27/2023   CL 103 02/27/2023   CREATININE 0.86 02/27/2023   BUN 15 02/27/2023   CO2 31 02/27/2023   TSH 2.21 02/27/2023    No results found.  Assessment & Plan:   Problem List Items Addressed This  Visit     B12 deficiency   Continue on vitamin B12      COVID-19 - Primary   1st episode Finishing Paxlovid  today Discussed  Relevant Orders   CBC with Differential/Platelet   Vitamin D  deficiency   Elev Vit D - was taking Vit D 5000 international units/d with K2: Rx on hold 12/2023      Relevant Orders   VITAMIN D  25 Hydroxy (Vit-D Deficiency, Fractures)   Well adult exam   Relevant Orders   TSH   Urinalysis   CBC with Differential/Platelet   Lipid panel   Comprehensive metabolic panel with GFR      No orders of the defined types were placed in this encounter.     Follow-up: Return in about 2 months (around 03/17/2024) for Wellness Exam.  Marolyn Noel, MD

## 2024-01-16 NOTE — Assessment & Plan Note (Signed)
 Elev Vit D - was taking Vit D 5000 international units/d with K2: Rx on hold 12/2023

## 2024-01-21 ENCOUNTER — Other Ambulatory Visit: Admitting: Obstetrics and Gynecology

## 2024-01-21 ENCOUNTER — Other Ambulatory Visit

## 2024-01-21 DIAGNOSIS — J3081 Allergic rhinitis due to animal (cat) (dog) hair and dander: Secondary | ICD-10-CM | POA: Diagnosis not present

## 2024-01-21 DIAGNOSIS — J3089 Other allergic rhinitis: Secondary | ICD-10-CM | POA: Diagnosis not present

## 2024-01-21 DIAGNOSIS — J301 Allergic rhinitis due to pollen: Secondary | ICD-10-CM | POA: Diagnosis not present

## 2024-01-24 DIAGNOSIS — L309 Dermatitis, unspecified: Secondary | ICD-10-CM | POA: Diagnosis not present

## 2024-01-24 DIAGNOSIS — L578 Other skin changes due to chronic exposure to nonionizing radiation: Secondary | ICD-10-CM | POA: Diagnosis not present

## 2024-01-24 DIAGNOSIS — Z86018 Personal history of other benign neoplasm: Secondary | ICD-10-CM | POA: Diagnosis not present

## 2024-01-24 DIAGNOSIS — D225 Melanocytic nevi of trunk: Secondary | ICD-10-CM | POA: Diagnosis not present

## 2024-01-24 DIAGNOSIS — L821 Other seborrheic keratosis: Secondary | ICD-10-CM | POA: Diagnosis not present

## 2024-01-24 DIAGNOSIS — D1800 Hemangioma unspecified site: Secondary | ICD-10-CM | POA: Diagnosis not present

## 2024-01-24 DIAGNOSIS — L719 Rosacea, unspecified: Secondary | ICD-10-CM | POA: Diagnosis not present

## 2024-01-24 DIAGNOSIS — D224 Melanocytic nevi of scalp and neck: Secondary | ICD-10-CM | POA: Diagnosis not present

## 2024-01-24 DIAGNOSIS — Z808 Family history of malignant neoplasm of other organs or systems: Secondary | ICD-10-CM | POA: Diagnosis not present

## 2024-01-24 DIAGNOSIS — D2230 Melanocytic nevi of unspecified part of face: Secondary | ICD-10-CM | POA: Diagnosis not present

## 2024-01-29 DIAGNOSIS — J3081 Allergic rhinitis due to animal (cat) (dog) hair and dander: Secondary | ICD-10-CM | POA: Diagnosis not present

## 2024-01-29 DIAGNOSIS — J301 Allergic rhinitis due to pollen: Secondary | ICD-10-CM | POA: Diagnosis not present

## 2024-01-29 DIAGNOSIS — J3089 Other allergic rhinitis: Secondary | ICD-10-CM | POA: Diagnosis not present

## 2024-02-04 DIAGNOSIS — J3089 Other allergic rhinitis: Secondary | ICD-10-CM | POA: Diagnosis not present

## 2024-02-04 DIAGNOSIS — J301 Allergic rhinitis due to pollen: Secondary | ICD-10-CM | POA: Diagnosis not present

## 2024-02-04 DIAGNOSIS — J3081 Allergic rhinitis due to animal (cat) (dog) hair and dander: Secondary | ICD-10-CM | POA: Diagnosis not present

## 2024-02-10 DIAGNOSIS — H43393 Other vitreous opacities, bilateral: Secondary | ICD-10-CM | POA: Diagnosis not present

## 2024-02-10 DIAGNOSIS — H35361 Drusen (degenerative) of macula, right eye: Secondary | ICD-10-CM | POA: Diagnosis not present

## 2024-02-10 DIAGNOSIS — H43811 Vitreous degeneration, right eye: Secondary | ICD-10-CM | POA: Diagnosis not present

## 2024-02-10 DIAGNOSIS — H43812 Vitreous degeneration, left eye: Secondary | ICD-10-CM | POA: Diagnosis not present

## 2024-02-10 DIAGNOSIS — H1045 Other chronic allergic conjunctivitis: Secondary | ICD-10-CM | POA: Diagnosis not present

## 2024-02-10 DIAGNOSIS — H16143 Punctate keratitis, bilateral: Secondary | ICD-10-CM | POA: Diagnosis not present

## 2024-02-10 DIAGNOSIS — H04123 Dry eye syndrome of bilateral lacrimal glands: Secondary | ICD-10-CM | POA: Diagnosis not present

## 2024-02-10 DIAGNOSIS — H2513 Age-related nuclear cataract, bilateral: Secondary | ICD-10-CM | POA: Diagnosis not present

## 2024-02-12 DIAGNOSIS — J3089 Other allergic rhinitis: Secondary | ICD-10-CM | POA: Diagnosis not present

## 2024-02-12 DIAGNOSIS — J3081 Allergic rhinitis due to animal (cat) (dog) hair and dander: Secondary | ICD-10-CM | POA: Diagnosis not present

## 2024-02-12 DIAGNOSIS — J301 Allergic rhinitis due to pollen: Secondary | ICD-10-CM | POA: Diagnosis not present

## 2024-02-19 ENCOUNTER — Ambulatory Visit: Admitting: Internal Medicine

## 2024-02-21 DIAGNOSIS — J301 Allergic rhinitis due to pollen: Secondary | ICD-10-CM | POA: Diagnosis not present

## 2024-02-21 DIAGNOSIS — J3081 Allergic rhinitis due to animal (cat) (dog) hair and dander: Secondary | ICD-10-CM | POA: Diagnosis not present

## 2024-02-21 DIAGNOSIS — J3089 Other allergic rhinitis: Secondary | ICD-10-CM | POA: Diagnosis not present

## 2024-02-24 ENCOUNTER — Encounter: Payer: Self-pay | Admitting: Cardiovascular Disease

## 2024-02-24 ENCOUNTER — Ambulatory Visit: Admitting: Internal Medicine

## 2024-02-25 ENCOUNTER — Encounter: Payer: Self-pay | Admitting: Cardiovascular Disease

## 2024-02-25 ENCOUNTER — Ambulatory Visit: Admitting: Internal Medicine

## 2024-02-26 DIAGNOSIS — J3081 Allergic rhinitis due to animal (cat) (dog) hair and dander: Secondary | ICD-10-CM | POA: Diagnosis not present

## 2024-02-26 DIAGNOSIS — J301 Allergic rhinitis due to pollen: Secondary | ICD-10-CM | POA: Diagnosis not present

## 2024-02-26 DIAGNOSIS — J3089 Other allergic rhinitis: Secondary | ICD-10-CM | POA: Diagnosis not present

## 2024-03-03 DIAGNOSIS — J3089 Other allergic rhinitis: Secondary | ICD-10-CM | POA: Diagnosis not present

## 2024-03-03 DIAGNOSIS — J301 Allergic rhinitis due to pollen: Secondary | ICD-10-CM | POA: Diagnosis not present

## 2024-03-03 DIAGNOSIS — J3081 Allergic rhinitis due to animal (cat) (dog) hair and dander: Secondary | ICD-10-CM | POA: Diagnosis not present

## 2024-03-06 DIAGNOSIS — M810 Age-related osteoporosis without current pathological fracture: Secondary | ICD-10-CM | POA: Diagnosis not present

## 2024-03-06 LAB — HM DEXA SCAN

## 2024-03-09 DIAGNOSIS — H2513 Age-related nuclear cataract, bilateral: Secondary | ICD-10-CM | POA: Diagnosis not present

## 2024-03-09 DIAGNOSIS — H04123 Dry eye syndrome of bilateral lacrimal glands: Secondary | ICD-10-CM | POA: Diagnosis not present

## 2024-03-09 DIAGNOSIS — H43813 Vitreous degeneration, bilateral: Secondary | ICD-10-CM | POA: Diagnosis not present

## 2024-03-09 DIAGNOSIS — H1045 Other chronic allergic conjunctivitis: Secondary | ICD-10-CM | POA: Diagnosis not present

## 2024-03-09 DIAGNOSIS — H35361 Drusen (degenerative) of macula, right eye: Secondary | ICD-10-CM | POA: Diagnosis not present

## 2024-03-09 DIAGNOSIS — G43109 Migraine with aura, not intractable, without status migrainosus: Secondary | ICD-10-CM | POA: Diagnosis not present

## 2024-03-09 DIAGNOSIS — H11122 Conjunctival concretions, left eye: Secondary | ICD-10-CM | POA: Diagnosis not present

## 2024-03-10 ENCOUNTER — Ambulatory Visit

## 2024-03-10 ENCOUNTER — Other Ambulatory Visit: Payer: Self-pay | Admitting: Obstetrics and Gynecology

## 2024-03-10 ENCOUNTER — Encounter: Payer: Self-pay | Admitting: Obstetrics and Gynecology

## 2024-03-10 ENCOUNTER — Ambulatory Visit: Admitting: Obstetrics and Gynecology

## 2024-03-10 ENCOUNTER — Encounter: Payer: Self-pay | Admitting: Internal Medicine

## 2024-03-10 VITALS — BP 126/78 | HR 70

## 2024-03-10 DIAGNOSIS — R1032 Left lower quadrant pain: Secondary | ICD-10-CM

## 2024-03-10 DIAGNOSIS — N760 Acute vaginitis: Secondary | ICD-10-CM

## 2024-03-10 NOTE — Progress Notes (Unsigned)
 GYNECOLOGY  VISIT   HPI: 73 y.o.   Married  Caucasian female   No obstetric history on file. with No LMP recorded. Patient is postmenopausal.   here for: U/S Consult for LLQ pain.   Hx renal stones.   Hx IBS.   Does a lot of working out and wonders if she strained a muscle.   Using compounded vaginal estrogen to increase her level of estradiol  in the vagina.    Sex can still be uncomfortable.   No burning or itching.   GYNECOLOGIC HISTORY: No LMP recorded. Patient is postmenopausal. Contraception:  PMP Menopausal hormone therapy:  n/a Last 2 paps:  07/01/23 neg HR HPV neg, 01/13/19 neg  History of abnormal Pap or positive HPV:  no Mammogram:  04/08/23 Breast Density Cat C, BIRADS Cat 1 neg         OB History   No obstetric history on file.        Patient Active Problem List   Diagnosis Date Noted  . Angioma 01/16/2024  . Epidermal nevus of face 01/16/2024  . 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 Frictional lichenoid dermatosis 01/16/2024  . Family history of skin cancer 01/16/2024  . History of malignant neoplasm of skin 01/16/2024  . History of neoplasm 01/16/2024  . Impetigo 01/16/2024  . Melanocytic nevus of trunk 01/16/2024  . Rosacea 01/16/2024  . Seborrheic keratosis 01/16/2024  . COVID-19 01/16/2024  . Vitamin D  deficiency 01/16/2024  . Shoulder blade pain 12/29/2023  . Adverse reaction to food 12/23/2023  . Upper respiratory infection 09/02/2023  . Fibrocystic breast changes 04/18/2022  . Osteoporosis 04/16/2022  . Dizziness 02/22/2022  . Toothache 12/20/2021  . Gluteal tendinitis of right buttock 03/16/2021  . Anxiety 11/03/2020  . Allergic rhinitis due to animal hair and dander 06/29/2020  . Allergic rhinitis due to pollen 06/29/2020  . Stress 05/30/2020  . Thoracic spine pain 02/17/2020  . Herpes zoster 09/17/2019  . Tachycardia 08/10/2019  . Vaccination complication 08/10/2019  . Adverse effect of COVID-19 vaccine 07/20/2019  . Myoneural disorder,  unspecified (HCC) 04/09/2019  . Sore throat 06/13/2018  . Friction injury to skin 04/14/2018  . Occipital neuralgia 03/03/2018  . B12 deficiency 03/03/2018  . Bilateral impacted cerumen 10/21/2017  . Excessive cerumen in both ear canals 10/21/2017  . LLQ abdominal pain 09/23/2017  . Elevated antinuclear antibody (ANA) level 07/26/2017  . Arthralgia 07/26/2017  . Degenerative disc disease, lumbar 07/04/2017  . Cough 06/06/2017  . Chest discomfort 03/21/2017  . Neck mass 01/17/2017  . Snoring 12/18/2016  . Medial tibial stress syndrome, left, initial encounter 08/14/2016  . Patellofemoral syndrome of both knees 08/09/2016  . Drug allergy, antibiotic 05/31/2016  . Hyperkalemia 02/17/2016  . Vertical diplopia 02/15/2016  . Tension headache 12/28/2015  . BPPV (benign paroxysmal positional vertigo) 10/08/2015  . TMJ click 09/13/2015  . Cold sore 07/18/2015  . Osteopenia 07/18/2015  . Earache on left 07/18/2015  . Cervical disc disorder with radiculopathy of cervical region 06/17/2015  . Cotton wool spots 03/15/2015  . Low back pain 03/08/2015  . Food poisoning 03/08/2015  . Generalized anxiety disorder 03/08/2015  . Dysuria 02/23/2015  . Well adult exam 09/09/2014  . Internal hemorrhoids 09/07/2014  . Allergic urticaria 08/20/2014  . Acute sinusitis 08/18/2014  . Neck muscle spasm 07/30/2014  . Grief 06/17/2014  . Mild aortic insufficiency   . Mild tricuspid regurgitation   . Vasovagal syncope   . Premature atrial contractions   . GERD (gastroesophageal reflux  disease)   . PVC (premature ventricular contraction)   . Dense breast tissue on mammogram 03/23/2014  . Microhematuria 03/22/2014  . Changing skin lesion 02/05/2014  . Rash and nonspecific skin eruption 12/22/2013  . Chronic meniscal tear of knee 12/21/2013  . Benign fasciculations 12/11/2013  . Strain of adductor magnus muscle of left lower extremity 05/21/2013  . Benign fasciculation-cramp syndrome 12/03/2012  .  Vasovagal near syncope 11/02/2012  . Migraine with aura 11/02/2012  . Upper airway cough syndrome 08/16/2012  . Bunion of great toe 09/11/2011  . Routine health maintenance 09/11/2011  . Mild mitral regurgitation by prior echocardiogram 05/15/2011  . Chronic neck pain 03/07/2011  . Skin lesion 03/07/2011  . DYSPLASTIC NEVUS, FACE 02/06/2010  . HAND PAIN, BILATERAL 08/25/2009  . Aortic valve disorder 08/02/2009  . Abnormal involuntary movement 07/28/2009  . Abnormal involuntary movement 07/28/2009  . LUMBAR SPRAIN AND STRAIN 05/21/2009  . Palpitations 02/03/2009  . Internal hemorrhoids 12/15/2008  . Constipation 12/15/2008  . IBS (irritable bowel syndrome) 12/15/2008  . RECTAL BLEEDING 12/15/2008  . RENAL CALCULUS, HX OF 11/29/2007  . Herpes simplex virus (HSV) infection 11/03/2007  . Vaginal atrophy 03/25/2007  . GLOBUS HYSTERICUS 01/01/2007  . Allergic rhinitis 01/01/2007    Past Medical History:  Diagnosis Date  . Allergic rhinitis, cause unspecified   . Backache, unspecified   . Benign fasciculation-cramp syndrome 12/03/2012    Worsening with  stress, fatigue .  SABRA Calculus of kidney   . Conversion disorder   . Cotton wool spots   . Esophageal reflux   . Floater, vitreous, left 05/2016  . GERD (gastroesophageal reflux disease)   . Hemorrhage of rectum and anus   . Internal hemorrhoids without mention of complication   . Irritable bowel syndrome   . Microscopic hematuria   . Migraine headache with aura   . Mild aortic insufficiency   . Mild tricuspid regurgitation   . Osteopenia   . Other plastic surgery for unacceptable cosmetic appearance    Inj tx fllers/expander  . Personal history of urinary calculi   . Postmenopausal atrophic vaginitis   . Premature atrial contractions   . PVC (premature ventricular contraction)    a. Event monitor 2013.  SABRA Unspecified constipation   . Vasovagal syncope     Past Surgical History:  Procedure Laterality Date  . BREAST  BIOPSY Right   . Cosmetic Procedures w/Injection therapy    . SKIN CANCER EXCISION Left    thigh  . WISDOM TOOTH EXTRACTION      Current Outpatient Medications  Medication Sig Dispense Refill  . Botulinum Toxin Type A, Cosm, 50 units SOLR Inject 4-6 Units into the muscle. 3 month  injection    . Cyanocobalamin  (B-12) 1000 MCG SUBL PLACE 1 TABLET (1,000 MCG TOTAL) UNDER THE TONGUE DAILY. 100 tablet 3  . DEXILANT  60 MG capsule TAKE 1 CAPSULE BY MOUTH EVERY DAY 30 capsule 11  . EPIPEN 2-PAK 0.3 MG/0.3ML SOAJ injection See admin instructions. Reported on 10/08/2015  1  . Estradiol  0.75 MG/1.25 GM (0.06%) topical gel 1 pump to skin to arm Transdermal Once a day    . EUCRISA 2 % OINT Apply topically. Apply 1 to 2 times a day    . famotidine  (PEPCID ) 40 MG tablet Take 1 tablet (40 mg total) by mouth daily. 90 tablet 3  . loratadine  (CLARITIN ) 10 MG tablet Take 1 tablet (10 mg total) by mouth daily. 100 tablet 3  . lubiprostone  (AMITIZA ) 8 MCG  capsule TAKE 1 CAPSULE (8 MCG TOTAL) BY MOUTH 2 (TWO) TIMES DAILY WITH A MEAL. 180 capsule 1  . Minoxidil (ROGAINE MENS EXTRA STRENGTH) 5 % FOAM 0.5 Applications.    . mometasone  (NASONEX ) 50 MCG/ACT nasal spray USE 2 SPRAYS IN EACH NOSTRIL EVERY DAY 51 each 1  . NUVESSA  1.3 % GEL SMARTSIG:1 unspecified Vaginal Once    . promethazine -dextromethorphan (PROMETHAZINE -DM) 6.25-15 MG/5ML syrup Take 5 mLs by mouth at bedtime as needed for cough. 118 mL 0  . timolol (TIMOPTIC) 0.25 % ophthalmic solution 2-3 DROPS ONTO AFFECTED AREA OPHTHALMIC ONCE A DAY 30 DAYS; Duration: 30    . tretinoin  (RETIN-A ) 0.05 % cream Apply 1 application  topically at bedtime. 45 g 3  . triamcinolone  ointment (KENALOG ) 0.1 % Apply topically 2 (two) times daily. 160 g 1  . UNABLE TO FIND once a week. Med Name: allergy shots    . valACYclovir  (VALTREX ) 500 MG tablet TAKE 1 TABLET BY MOUTH TWICE A DAY 10 tablet 2  . VITAMIN D -VITAMIN K PO Take by mouth.     No current facility-administered  medications for this visit.     ALLERGIES: Codeine, Doxycycline , Epinephrine, Latex, Neomycin, Penicillins, Promethazine , Protonix  [pantoprazole  sodium], Sulfasalazine, Sulfonamide derivatives, and Zithromax  [azithromycin ]  Family History  Problem Relation Age of Onset  . Hyperlipidemia Mother   . Hypertension Mother   . Hyperlipidemia Father   . COPD Father        Smoker, deceased 14-Jul-2014  . Hypertension Father   . Hypertension Sister   . Hyperlipidemia Sister   . Headache Sister   . Diabetes Neg Hx   . Colon cancer Neg Hx   . Breast cancer Neg Hx   . Coronary artery disease Neg Hx   . Esophageal cancer Neg Hx   . Stomach cancer Neg Hx   . Pancreatic cancer Neg Hx   . Liver disease Neg Hx     Social History   Socioeconomic History  . Marital status: Married    Spouse name: Oliva  . Number of children: 0  . Years of education: 38  . Highest education level: Not on file  Occupational History  . Occupation: OFFICE liasion    Employer: [employer]  . Occupation: Engineer, civil (consulting)  . Occupation: RETIRED/OFFICE [employer]ON    Employer: [employer]  Tobacco Use  . Smoking status: Never    Passive exposure: Past  . Smokeless tobacco: Never  Vaping Use  . Vaping status: Never Used  Substance and Sexual Activity  . Alcohol use: No  . Drug use: No  . Sexual activity: Yes    Birth control/protection: Post-menopausal    Comment: less than 5, IC after 18, no STD, no abnormal pap, no DES exposure  Other Topics Concern  . Not on file  Social History Narrative   Vocational School in [place]. Married '90 - marriage is in very good shape '12, No children.  Regular Exercise -  YES, body builder. Has aging parents in the [place] who are thinking of snow birding in [place]. Offerred medical services for them if needed (Nov '11)   Patient is married [name]) and lives at home with her husband.   Patient is working Pensions consultant work.   Patient has a high  school education.   Patient is right-handed.   Patient does not drink any caffeine.      Lives with husband-2025      Social Drivers of Health   Financial  Resource Strain: Low Risk  (12/22/2023)   Overall Financial Resource Strain (CARDIA)   . Difficulty of Paying Living Expenses: Not hard at all  Food Insecurity: No Food Insecurity (12/22/2023)   Hunger Vital Sign   . Worried About Programme researcher, broadcasting/film/video in the Last Year: Never true   . Ran Out of Food in the Last Year: Never true  Transportation Needs: No Transportation Needs (12/22/2023)   PRAPARE - Transportation   . Lack of Transportation (Medical): No   . Lack of Transportation (Non-Medical): No  Physical Activity: Sufficiently Active (12/22/2023)   Exercise Vital Sign   . Days of Exercise per Week: 5 days   . Minutes of Exercise per Session: 60 min  Stress: No Stress Concern Present (12/22/2023)   [employer] of Occupational Health - Occupational Stress Questionnaire   . Feeling of Stress: Not at all  Social Connections: Moderately Integrated (12/22/2023)   Social Connection and Isolation Panel   . Frequency of Communication with Friends and Family: More than three times a week   . Frequency of Social Gatherings with Friends and Family: Twice a week   . Attends Religious Services: More than 4 times per year   . Active Member of Clubs or Organizations: No   . Attends Banker Meetings: Not on file   . Marital Status: Married  Catering manager Violence: Not At Risk (07/10/2023)   Humiliation, Afraid, Rape, and Kick questionnaire   . Fear of Current or Ex-Partner: No   . Emotionally Abused: No   . Physically Abused: No   . Sexually Abused: No    Review of Systems  PHYSICAL EXAMINATION:   There were no vitals taken for this visit.    General appearance: alert, cooperative and appears stated age Head: Normocephalic, without obvious abnormality, atraumatic Neck: no adenopathy, supple, symmetrical, trachea midline  and thyroid  normal to inspection and palpation Lungs: clear to auscultation bilaterally Breasts: normal appearance, no masses or tenderness, No nipple retraction or dimpling, No nipple discharge or bleeding, No axillary or supraclavicular adenopathy Heart: regular rate and rhythm Abdomen: soft, non-tender, no masses,  no organomegaly Extremities: extremities normal, atraumatic, no cyanosis or edema Skin: Skin color, texture, turgor normal. No rashes or lesions Lymph nodes: Cervical, supraclavicular, and axillary nodes normal. No abnormal inguinal nodes palpated Neurologic: Grossly normal  Pelvic: External genitalia:  no lesions              Urethra:  normal appearing urethra with no masses, tenderness or lesions              Bartholins and Skenes: normal                 Vagina: normal appearing vagina with normal color and discharge, no lesions              Cervix: no lesions                Bimanual Exam:  Uterus:  normal size, contour, position, consistency, mobility, non-tender              Adnexa: no mass, fullness, tenderness              Rectal exam: {yes no:314532}.  Confirms.              Anus:  normal sphincter tone, no lesions  Chaperone was present for exam:  {BSCHAPERONE:31226::Emily F, CMA}  ASSESSMENT:    PLAN:    {LABS (Optional):23779}  ***  total time was spent for this patient encounter, including preparation, face-to-face counseling with the patient, coordination of care, and documentation of the encounter.

## 2024-03-10 NOTE — Telephone Encounter (Signed)
 I called & spoke to patient. Per Dr Nikki, patient is to arrive with a full bladder and the u/s technician will have her to empty her bladder if not needed. Patient is aware.

## 2024-03-11 DIAGNOSIS — J3089 Other allergic rhinitis: Secondary | ICD-10-CM | POA: Diagnosis not present

## 2024-03-11 DIAGNOSIS — J301 Allergic rhinitis due to pollen: Secondary | ICD-10-CM | POA: Diagnosis not present

## 2024-03-11 DIAGNOSIS — J3081 Allergic rhinitis due to animal (cat) (dog) hair and dander: Secondary | ICD-10-CM | POA: Diagnosis not present

## 2024-03-13 ENCOUNTER — Other Ambulatory Visit: Payer: Self-pay | Admitting: Internal Medicine

## 2024-03-15 ENCOUNTER — Telehealth: Payer: Self-pay | Admitting: Obstetrics and Gynecology

## 2024-03-15 NOTE — Telephone Encounter (Signed)
 Please contact patient regarding a medication review I did as I was finalizing her pelvic US  report.   I saw transdermal estrogen gel listed on her medications.  If she is using transdermal estrogen gel that she rubs into her skin, she also needs to be taking progesterone to protect her endometrial lining from developing uterine cancer.  I did not see progesterone on her medication list.   Her vaginal estrogen cream does not require the use of progesterone.

## 2024-03-16 ENCOUNTER — Other Ambulatory Visit: Payer: Self-pay | Admitting: Internal Medicine

## 2024-03-16 ENCOUNTER — Encounter: Payer: Self-pay | Admitting: Internal Medicine

## 2024-03-16 ENCOUNTER — Telehealth: Payer: Self-pay | Admitting: Internal Medicine

## 2024-03-16 NOTE — Telephone Encounter (Unsigned)
 Copied from CRM #[id number]. Topic: Clinical - Prescription Issue >> Mar 16, 2024  4:21 PM Amy B wrote: Reason for CRM: Patients states she requested a refill of Dexilant .  However, pharmacy filled RX for Protonix .  She did not pick up the prescription and is adamant of getting a refill of Dexilant .  Please advise.

## 2024-03-16 NOTE — Telephone Encounter (Signed)
 Call placed to patient, left detailed message, ok per dpr. Advised per Dr. Nikki. Return call to office at [phone], opt 4 to provide update.

## 2024-03-16 NOTE — Telephone Encounter (Signed)
 Spoke with patient advised per Dr. Nikki.  Patient has never taken topical estrogen gel, medication list updated.   Routing to Dr. Nikki LIPS.   Encounter closed.

## 2024-03-16 NOTE — Telephone Encounter (Signed)
 Copied from CRM #[id number]. Topic: Clinical - Prescription Issue >> Mar 16, 2024 10:38 AM [place] J wrote: Reason for CRM: Patient wanted to let Dr. Garald know that her pharmacy will be reaching ou to let him know that her insurance does not cover her DEXILANT  60 MG capsule and that a generic brand will need to be sent through. She wants him to know that she is 100% okay with paying out of pocket for her medication and she does not want any generic medication sent in for this medication. ----------------------------------------------------------------------- From previous Reason for Contact - Other: Reason for CRM:

## 2024-03-18 ENCOUNTER — Other Ambulatory Visit: Payer: Self-pay

## 2024-03-18 DIAGNOSIS — J3089 Other allergic rhinitis: Secondary | ICD-10-CM | POA: Diagnosis not present

## 2024-03-18 DIAGNOSIS — J3081 Allergic rhinitis due to animal (cat) (dog) hair and dander: Secondary | ICD-10-CM | POA: Diagnosis not present

## 2024-03-18 DIAGNOSIS — J301 Allergic rhinitis due to pollen: Secondary | ICD-10-CM | POA: Diagnosis not present

## 2024-03-18 MED ORDER — DEXILANT 60 MG PO CPDR: 60.0000 mg | DELAYED_RELEASE_CAPSULE | Freq: Every day | ORAL | 11 refills | Status: AC

## 2024-03-18 NOTE — Telephone Encounter (Signed)
 Noted and addressing in MyChart message to patient

## 2024-03-26 DIAGNOSIS — J301 Allergic rhinitis due to pollen: Secondary | ICD-10-CM | POA: Diagnosis not present

## 2024-03-26 DIAGNOSIS — J3089 Other allergic rhinitis: Secondary | ICD-10-CM | POA: Diagnosis not present

## 2024-03-26 DIAGNOSIS — J3081 Allergic rhinitis due to animal (cat) (dog) hair and dander: Secondary | ICD-10-CM | POA: Diagnosis not present

## 2024-03-30 ENCOUNTER — Ambulatory Visit: Admitting: Internal Medicine

## 2024-04-01 ENCOUNTER — Encounter: Payer: Self-pay | Admitting: Internal Medicine

## 2024-04-01 ENCOUNTER — Ambulatory Visit

## 2024-04-01 DIAGNOSIS — J301 Allergic rhinitis due to pollen: Secondary | ICD-10-CM | POA: Diagnosis not present

## 2024-04-01 DIAGNOSIS — J3081 Allergic rhinitis due to animal (cat) (dog) hair and dander: Secondary | ICD-10-CM | POA: Diagnosis not present

## 2024-04-01 DIAGNOSIS — J3089 Other allergic rhinitis: Secondary | ICD-10-CM | POA: Diagnosis not present

## 2024-04-05 ENCOUNTER — Other Ambulatory Visit: Payer: Self-pay | Admitting: Internal Medicine

## 2024-04-05 DIAGNOSIS — Z77011 Contact with and (suspected) exposure to lead: Secondary | ICD-10-CM

## 2024-04-06 ENCOUNTER — Ambulatory Visit: Payer: Self-pay | Admitting: Internal Medicine

## 2024-04-06 ENCOUNTER — Ambulatory Visit

## 2024-04-09 ENCOUNTER — Ambulatory Visit (INDEPENDENT_AMBULATORY_CARE_PROVIDER_SITE_OTHER)

## 2024-04-09 DIAGNOSIS — Z23 Encounter for immunization: Secondary | ICD-10-CM | POA: Diagnosis not present

## 2024-04-09 NOTE — Progress Notes (Signed)
 After obtaining consent, and per orders of Dr. Garald, injection of High Dose Flu given by Edsel CHRISTELLA Kerns. Patient tolerated well.

## 2024-04-10 ENCOUNTER — Ambulatory Visit: Admitting: Internal Medicine

## 2024-04-13 ENCOUNTER — Ambulatory Visit: Payer: Medicare Other | Admitting: Neurology

## 2024-04-13 DIAGNOSIS — Z1231 Encounter for screening mammogram for malignant neoplasm of breast: Secondary | ICD-10-CM | POA: Diagnosis not present

## 2024-04-13 LAB — HM MAMMOGRAPHY

## 2024-04-14 DIAGNOSIS — J3081 Allergic rhinitis due to animal (cat) (dog) hair and dander: Secondary | ICD-10-CM | POA: Diagnosis not present

## 2024-04-14 DIAGNOSIS — J301 Allergic rhinitis due to pollen: Secondary | ICD-10-CM | POA: Diagnosis not present

## 2024-04-14 DIAGNOSIS — J3089 Other allergic rhinitis: Secondary | ICD-10-CM | POA: Diagnosis not present

## 2024-04-15 ENCOUNTER — Ambulatory Visit

## 2024-04-16 ENCOUNTER — Ambulatory Visit: Admitting: Family Medicine

## 2024-04-20 ENCOUNTER — Other Ambulatory Visit

## 2024-04-20 DIAGNOSIS — E559 Vitamin D deficiency, unspecified: Secondary | ICD-10-CM

## 2024-04-20 DIAGNOSIS — U071 COVID-19: Secondary | ICD-10-CM | POA: Diagnosis not present

## 2024-04-20 DIAGNOSIS — Z0000 Encounter for general adult medical examination without abnormal findings: Secondary | ICD-10-CM

## 2024-04-20 DIAGNOSIS — T452X1A Poisoning by vitamins, accidental (unintentional), initial encounter: Secondary | ICD-10-CM

## 2024-04-20 DIAGNOSIS — J3089 Other allergic rhinitis: Secondary | ICD-10-CM | POA: Diagnosis not present

## 2024-04-20 DIAGNOSIS — J3081 Allergic rhinitis due to animal (cat) (dog) hair and dander: Secondary | ICD-10-CM | POA: Diagnosis not present

## 2024-04-20 DIAGNOSIS — Z77011 Contact with and (suspected) exposure to lead: Secondary | ICD-10-CM

## 2024-04-20 DIAGNOSIS — J301 Allergic rhinitis due to pollen: Secondary | ICD-10-CM | POA: Diagnosis not present

## 2024-04-20 LAB — URINALYSIS
Bilirubin Urine: NEGATIVE
Hgb urine dipstick: NEGATIVE
Ketones, ur: NEGATIVE
Leukocytes,Ua: NEGATIVE
Nitrite: NEGATIVE
Specific Gravity, Urine: 1.015 (ref 1.000–1.030)
Total Protein, Urine: NEGATIVE
Urine Glucose: NEGATIVE
Urobilinogen, UA: 0.2 (ref 0.0–1.0)
pH: 7 (ref 5.0–8.0)

## 2024-04-20 LAB — COMPREHENSIVE METABOLIC PANEL WITH GFR
ALT: 15 U/L (ref 0–35)
AST: 17 U/L (ref 0–37)
Albumin: 4.1 g/dL (ref 3.5–5.2)
Alkaline Phosphatase: 59 U/L (ref 39–117)
BUN: 20 mg/dL (ref 6–23)
CO2: 28 meq/L (ref 19–32)
Calcium: 9.3 mg/dL (ref 8.4–10.5)
Chloride: 105 meq/L (ref 96–112)
Creatinine, Ser: 0.79 mg/dL (ref 0.40–1.20)
GFR: 75.22 mL/min (ref >60.00)
Glucose, Bld: 82 mg/dL (ref 70–99)
Potassium: 4 meq/L (ref 3.5–5.1)
Sodium: 141 meq/L (ref 135–145)
Total Bilirubin: 0.6 mg/dL (ref 0.2–1.2)
Total Protein: 6.9 g/dL (ref 6.0–8.3)

## 2024-04-20 LAB — CBC WITH DIFFERENTIAL/PLATELET
Basophils Absolute: 0.1 K/uL (ref 0.0–0.1)
Basophils Relative: 1.4 % (ref 0.0–3.0)
Eosinophils Absolute: 0.1 K/uL (ref 0.0–0.7)
Eosinophils Relative: 4 % (ref 0.0–5.0)
HCT: 35.8 % — ABNORMAL LOW (ref 36.0–46.0)
Hemoglobin: 12 g/dL (ref 12.0–15.0)
Lymphocytes Relative: 33 % (ref 12.0–46.0)
Lymphs Abs: 1.2 K/uL (ref 0.7–4.0)
MCHC: 33.6 g/dL (ref 30.0–36.0)
MCV: 91.6 fl (ref 78.0–100.0)
Monocytes Absolute: 0.3 K/uL (ref 0.1–1.0)
Monocytes Relative: 9.4 % (ref 3.0–12.0)
Neutro Abs: 1.9 K/uL (ref 1.4–7.7)
Neutrophils Relative %: 52.2 % (ref 43.0–77.0)
Platelets: 174 K/uL (ref 150.0–400.0)
RBC: 3.91 Mil/uL (ref 3.87–5.11)
RDW: 13.1 % (ref 11.5–15.5)
WBC: 3.6 K/uL — ABNORMAL LOW (ref 4.0–10.5)

## 2024-04-20 LAB — LIPID PANEL
Cholesterol: 190 mg/dL (ref 0–200)
HDL: 70.7 mg/dL (ref >39.00)
LDL Cholesterol: 103 mg/dL — ABNORMAL HIGH (ref 0–99)
NonHDL: 118.97
Total CHOL/HDL Ratio: 3
Triglycerides: 78 mg/dL (ref 0.0–149.0)
VLDL: 15.6 mg/dL (ref 0.0–40.0)

## 2024-04-20 LAB — TSH: TSH: 1.38 u[IU]/mL (ref 0.35–5.50)

## 2024-04-20 LAB — VITAMIN D 25 HYDROXY (VIT D DEFICIENCY, FRACTURES): VITD: 57.37 ng/mL (ref 30.00–100.00)

## 2024-04-21 LAB — UNLABELED: Test Ordered On Req: 36438

## 2024-04-22 ENCOUNTER — Ambulatory Visit: Payer: Self-pay | Admitting: Internal Medicine

## 2024-04-22 ENCOUNTER — Telehealth: Payer: Self-pay

## 2024-04-22 NOTE — Telephone Encounter (Signed)
 Copied from CRM [id number]. Topic: General - Other >> Apr 22, 2024 12:53 PM Burnard DEL wrote: Reason for CRM: [facility] called in stating heavy metal sample form 11/3 had no name on the sample. They would like to know if lab would still like sample to be ran?   Mzq#:[id number]

## 2024-04-27 ENCOUNTER — Ambulatory Visit: Admitting: Internal Medicine

## 2024-04-27 DIAGNOSIS — J301 Allergic rhinitis due to pollen: Secondary | ICD-10-CM | POA: Diagnosis not present

## 2024-04-27 DIAGNOSIS — J3089 Other allergic rhinitis: Secondary | ICD-10-CM | POA: Diagnosis not present

## 2024-04-27 DIAGNOSIS — J3081 Allergic rhinitis due to animal (cat) (dog) hair and dander: Secondary | ICD-10-CM | POA: Diagnosis not present

## 2024-04-30 ENCOUNTER — Ambulatory Visit: Admitting: Internal Medicine

## 2024-05-04 DIAGNOSIS — Q828 Other specified congenital malformations of skin: Secondary | ICD-10-CM | POA: Diagnosis not present

## 2024-05-04 DIAGNOSIS — L700 Acne vulgaris: Secondary | ICD-10-CM | POA: Diagnosis not present

## 2024-05-04 DIAGNOSIS — L570 Actinic keratosis: Secondary | ICD-10-CM | POA: Diagnosis not present

## 2024-05-06 ENCOUNTER — Ambulatory Visit: Admitting: Internal Medicine

## 2024-05-06 DIAGNOSIS — J301 Allergic rhinitis due to pollen: Secondary | ICD-10-CM | POA: Diagnosis not present

## 2024-05-06 DIAGNOSIS — J3089 Other allergic rhinitis: Secondary | ICD-10-CM | POA: Diagnosis not present

## 2024-05-06 DIAGNOSIS — J3081 Allergic rhinitis due to animal (cat) (dog) hair and dander: Secondary | ICD-10-CM | POA: Diagnosis not present

## 2024-05-07 ENCOUNTER — Ambulatory Visit: Admitting: Internal Medicine

## 2024-05-12 ENCOUNTER — Ambulatory Visit: Admitting: Internal Medicine

## 2024-05-12 DIAGNOSIS — J3081 Allergic rhinitis due to animal (cat) (dog) hair and dander: Secondary | ICD-10-CM | POA: Diagnosis not present

## 2024-05-12 DIAGNOSIS — J3089 Other allergic rhinitis: Secondary | ICD-10-CM | POA: Diagnosis not present

## 2024-05-12 DIAGNOSIS — J301 Allergic rhinitis due to pollen: Secondary | ICD-10-CM | POA: Diagnosis not present

## 2024-05-13 ENCOUNTER — Other Ambulatory Visit: Payer: Self-pay

## 2024-05-13 ENCOUNTER — Ambulatory Visit (INDEPENDENT_AMBULATORY_CARE_PROVIDER_SITE_OTHER): Admitting: Internal Medicine

## 2024-05-13 ENCOUNTER — Encounter: Payer: Self-pay | Admitting: Internal Medicine

## 2024-05-13 ENCOUNTER — Telehealth: Payer: Self-pay

## 2024-05-13 VITALS — BP 114/68 | HR 71 | Ht 61.0 in | Wt 102.4 lb

## 2024-05-13 DIAGNOSIS — L719 Rosacea, unspecified: Secondary | ICD-10-CM

## 2024-05-13 DIAGNOSIS — E559 Vitamin D deficiency, unspecified: Secondary | ICD-10-CM | POA: Diagnosis not present

## 2024-05-13 DIAGNOSIS — G43109 Migraine with aura, not intractable, without status migrainosus: Secondary | ICD-10-CM

## 2024-05-13 DIAGNOSIS — M810 Age-related osteoporosis without current pathological fracture: Secondary | ICD-10-CM

## 2024-05-13 MED ORDER — DENOSUMAB 60 MG/ML ~~LOC~~ SOSY: 60.0000 mg | PREFILLED_SYRINGE | Freq: Once | SUBCUTANEOUS | Status: AC

## 2024-05-13 MED ORDER — TRETINOIN 0.1 % EX CREA: TOPICAL_CREAM | Freq: Every day | CUTANEOUS | 3 refills | Status: AC

## 2024-05-13 NOTE — Patient Instructions (Addendum)
 Prolia , jubbonti Use [employer] Peroxicare tooth paste

## 2024-05-13 NOTE — Assessment & Plan Note (Signed)
 Options were discussed. Read about Prolia , jubbonti Cont w/strength training Vit D and K3 - take QOD

## 2024-05-13 NOTE — Telephone Encounter (Signed)
 Prolia  VOB initiated via [email or web address]  Next Prolia  inj DUE: NEW START

## 2024-05-13 NOTE — Progress Notes (Signed)
 Subjective:  Patient ID: [name], female    DOB: 27-Nov-1950  Age: 73 y.o. MRN: [id number]  CC: No chief complaint on file.   HPI Davanna He presents for osteopenia  Outpatient Medications Prior to Visit  Medication Sig Dispense Refill   Botulinum Toxin Type A, Cosm, 50 units SOLR Inject 4-6 Units into the muscle. 3 month  injection     Cyanocobalamin  (B-12) 1000 MCG SUBL PLACE 1 TABLET (1,000 MCG TOTAL) UNDER THE TONGUE DAILY. 100 tablet 3   DEXILANT  60 MG capsule Take 1 capsule (60 mg total) by mouth daily. 30 capsule 11   EPIPEN 2-PAK 0.3 MG/0.3ML SOAJ injection See admin instructions. Reported on 10/08/2015  1   EUCRISA 2 % OINT Apply topically. Apply 1 to 2 times a day     famotidine  (PEPCID ) 40 MG tablet Take 1 tablet (40 mg total) by mouth daily. 90 tablet 3   loratadine  (CLARITIN ) 10 MG tablet Take 1 tablet (10 mg total) by mouth daily. 100 tablet 3   mometasone  (NASONEX ) 50 MCG/ACT nasal spray USE 2 SPRAYS IN EACH NOSTRIL EVERY DAY 51 each 1   pantoprazole  (PROTONIX ) 40 MG tablet Take 1 tablet (40 mg total) by mouth daily. 90 tablet 0   timolol (TIMOPTIC) 0.25 % ophthalmic solution 2-3 DROPS ONTO AFFECTED AREA OPHTHALMIC ONCE A DAY 30 DAYS; Duration: 30     triamcinolone  ointment (KENALOG ) 0.1 % Apply topically 2 (two) times daily. 160 g 1   UNABLE TO FIND once a week. Med Name: allergy shots     valACYclovir  (VALTREX ) 500 MG tablet TAKE 1 TABLET BY MOUTH TWICE A DAY 10 tablet 2   VITAMIN D -VITAMIN K PO Take by mouth. (Patient not taking: Reported on 03/10/2024)     tretinoin  (RETIN-A ) 0.05 % cream Apply 1 application  topically at bedtime. 45 g 3   No facility-administered medications prior to visit.    ROS: Review of Systems  Constitutional:  Negative for activity change, appetite change, chills, fatigue and unexpected weight change.  HENT:  Negative for congestion, mouth sores and sinus pressure.   Eyes:  Negative for visual disturbance.  Respiratory:   Negative for cough and chest tightness.   Gastrointestinal:  Negative for abdominal pain and nausea.  Genitourinary:  Negative for difficulty urinating, frequency and vaginal pain.  Musculoskeletal:  Negative for back pain and gait problem.  Skin:  Negative for pallor and rash.  Neurological:  Negative for dizziness, tremors, weakness, numbness and headaches.  Psychiatric/Behavioral:  Negative for confusion and sleep disturbance.     Objective:  BP 114/68   Pulse 71   Ht 5' 1 (1.549 m)   Wt 102 lb 6.4 oz (46.4 kg)   SpO2 97%   BMI 19.35 kg/m   BP Readings from Last 3 Encounters:  05/13/24 114/68  03/10/24 126/78  01/16/24 136/78    Wt Readings from Last 3 Encounters:  05/13/24 102 lb 6.4 oz (46.4 kg)  01/16/24 101 lb (45.8 kg)  12/23/23 106 lb (48.1 kg)    Physical Exam Constitutional:      General: She is not in acute distress.    Appearance: She is well-developed.  HENT:     Head: Normocephalic.     Right Ear: External ear normal.     Left Ear: External ear normal.     Nose: Nose normal.  Eyes:     General:        Right eye: No discharge.  Left eye: No discharge.     Conjunctiva/sclera: Conjunctivae normal.     Pupils: Pupils are equal, round, and reactive to light.  Neck:     Thyroid : No thyromegaly.     Vascular: No JVD.     Trachea: No tracheal deviation.  Cardiovascular:     Rate and Rhythm: Normal rate and regular rhythm.     Heart sounds: Normal heart sounds.  Pulmonary:     Effort: No respiratory distress.     Breath sounds: No stridor. No wheezing.  Abdominal:     General: Bowel sounds are normal. There is no distension.     Palpations: Abdomen is soft. There is no mass.     Tenderness: There is no abdominal tenderness. There is no guarding or rebound.  Musculoskeletal:        General: No tenderness.     Cervical back: Normal range of motion and neck supple. No rigidity.  Lymphadenopathy:     Cervical: No cervical adenopathy.  Skin:     Findings: No erythema or rash.  Neurological:     Cranial Nerves: No cranial nerve deficit.     Motor: No abnormal muscle tone.     Coordination: Coordination normal.     Deep Tendon Reflexes: Reflexes normal.  Psychiatric:        Behavior: Behavior normal.        Thought Content: Thought content normal.        Judgment: Judgment normal.     Lab Results  Component Value Date   WBC 3.6 (L) 04/20/2024   HGB 12.0 04/20/2024   HCT 35.8 (L) 04/20/2024   PLT 174.0 04/20/2024   GLUCOSE 82 04/20/2024   CHOL 190 04/20/2024   TRIG 78.0 04/20/2024   HDL 70.70 04/20/2024   LDLCALC 103 (H) 04/20/2024   ALT 15 04/20/2024   AST 17 04/20/2024   NA 141 04/20/2024   K 4.0 04/20/2024   CL 105 04/20/2024   CREATININE 0.79 04/20/2024   BUN 20 04/20/2024   CO2 28 04/20/2024   TSH 1.38 04/20/2024    No results found.  Assessment & Plan:   Problem List Items Addressed This Visit     Migraine with aura   Rare relapses - Ibuprofen prn      Osteoporosis - Primary   Options were discussed. Read about Prolia , jubbonti Cont w/strength training Vit D and K3 - take QOD      Rosacea   Retin A - 0.1% strength qd      Vitamin D  deficiency   Vit D and K3 - take QOD         Meds ordered this encounter  Medications   tretinoin  (RETIN-A ) 0.1 % cream    Sig: Apply topically at bedtime.    Dispense:  20 g    Refill:  3      Follow-up: Return in about 4 months (around 09/10/2024) for a follow-up visit.  Marolyn Noel, MD

## 2024-05-13 NOTE — Assessment & Plan Note (Signed)
 Retin A - 0.1% strength qd

## 2024-05-13 NOTE — Assessment & Plan Note (Signed)
Rare relapses - Ibuprofen prn ?

## 2024-05-13 NOTE — Assessment & Plan Note (Signed)
 Vit D and K3 - take QOD

## 2024-05-18 ENCOUNTER — Encounter: Payer: Self-pay | Admitting: Internal Medicine

## 2024-05-18 ENCOUNTER — Other Ambulatory Visit (HOSPITAL_COMMUNITY): Payer: Self-pay

## 2024-05-18 NOTE — Telephone Encounter (Signed)
 Pt ready for scheduling for PROLIA  on or after : 05/18/24  Option# 1: Buy/Bill (Office supplied medication)  Out-of-pocket cost due at time of clinic visit: $0  Number of injection/visits approved: ---  Primary: MEDICARE Prolia  co-insurance: 0% Admin fee co-insurance: 0%  Secondary: BCBS[place]-MEDSUP Prolia  co-insurance:  Admin fee co-insurance:   Medical Benefit Details: Date Benefits were checked: 05/13/24 Deductible: $257 Met of $257 Required/ Coinsurance: 0%/ Admin Fee: 0%  Prior Auth: N/A PA# Expiration Date:   # of doses approved: ----------------------------------------------------------------------- Option# 2- Med Obtained from pharmacy:  Pharmacy benefit: Copay $590.44 (Paid to pharmacy) Admin Fee: 0% (Pay at clinic)  Prior Auth: N/A PA# Expiration Date:   # of doses approved:   If patient wants fill through the pharmacy benefit please send prescription to: [facility], and include estimated need by date in rx notes. Pharmacy will ship medication directly to the office.  Patient NOT eligible for Prolia  Copay Card. Copay Card can make patient's cost as little as $25. Link to apply: [email or web address]://www.[email or web address]/copay  ** This summary of benefits is an estimation of the patient's out-of-pocket cost. Exact cost may very based on individual plan coverage.

## 2024-05-18 NOTE — Telephone Encounter (Signed)
 [name]

## 2024-05-19 ENCOUNTER — Ambulatory Visit: Admitting: Internal Medicine

## 2024-05-21 DIAGNOSIS — J301 Allergic rhinitis due to pollen: Secondary | ICD-10-CM | POA: Diagnosis not present

## 2024-05-21 DIAGNOSIS — J3081 Allergic rhinitis due to animal (cat) (dog) hair and dander: Secondary | ICD-10-CM | POA: Diagnosis not present

## 2024-05-21 DIAGNOSIS — J3089 Other allergic rhinitis: Secondary | ICD-10-CM | POA: Diagnosis not present

## 2024-05-26 DIAGNOSIS — J3081 Allergic rhinitis due to animal (cat) (dog) hair and dander: Secondary | ICD-10-CM | POA: Diagnosis not present

## 2024-05-26 DIAGNOSIS — J3089 Other allergic rhinitis: Secondary | ICD-10-CM | POA: Diagnosis not present

## 2024-05-26 DIAGNOSIS — J301 Allergic rhinitis due to pollen: Secondary | ICD-10-CM | POA: Diagnosis not present

## 2024-06-04 ENCOUNTER — Encounter: Payer: Self-pay | Admitting: Podiatry

## 2024-06-04 ENCOUNTER — Ambulatory Visit: Admitting: Podiatry

## 2024-06-04 DIAGNOSIS — M2042 Other hammer toe(s) (acquired), left foot: Secondary | ICD-10-CM

## 2024-06-04 DIAGNOSIS — M21619 Bunion of unspecified foot: Secondary | ICD-10-CM

## 2024-06-04 DIAGNOSIS — M2041 Other hammer toe(s) (acquired), right foot: Secondary | ICD-10-CM | POA: Diagnosis not present

## 2024-06-04 DIAGNOSIS — Q828 Other specified congenital malformations of skin: Secondary | ICD-10-CM | POA: Diagnosis not present

## 2024-06-04 NOTE — Progress Notes (Signed)
 Subjective:   Patient ID: [name], female   DOB: 73 y.o.   MRN: [id number]   HPI Patient presents with significant lesion formation bilateral and also concerns about structural bunion deformity and hammertoe and gradual worsening of the condition   ROS      Objective:  Physical Exam  No neurovascular status unchanged significant structural malalignment which is gradually intensifying right and left foot with elevation of the second digits bilateral with numerous lesions plantar aspect both feet with lucent cores     Assessment:  Chronic keratotic lesion formation bilateral structural bunion hammertoe deformity     Plan:  H&P reviewed the condition and the structural malalignment and I did explain to her surgery which at this point would be relatively difficult but could be done.  She will think about this and today I debrided lesions bilateral no iatrogenic bleeding will be seen back as needed for treatment of chronic porokeratotic lesions

## 2024-06-08 ENCOUNTER — Ambulatory Visit: Admitting: Podiatry

## 2024-06-09 ENCOUNTER — Ambulatory Visit: Admitting: Family Medicine

## 2024-06-12 ENCOUNTER — Ambulatory Visit: Admitting: Internal Medicine

## 2024-06-15 ENCOUNTER — Ambulatory Visit: Admitting: Internal Medicine

## 2024-06-15 ENCOUNTER — Encounter: Payer: Self-pay | Admitting: Internal Medicine

## 2024-06-15 VITALS — BP 118/70 | HR 74 | Temp 98.1°F | Ht 61.0 in | Wt 102.0 lb

## 2024-06-15 DIAGNOSIS — E538 Deficiency of other specified B group vitamins: Secondary | ICD-10-CM

## 2024-06-15 DIAGNOSIS — Z83719 Family history of colon polyps, unspecified: Secondary | ICD-10-CM | POA: Diagnosis not present

## 2024-06-15 NOTE — Progress Notes (Signed)
 "   Subjective:  Patient ID: [name], female    DOB: 1950/11/26  Age: 73 y.o. MRN: [id number]  CC: Medical Management of Chronic Issues (Wants to discuss doing colonoscopy )   HPI [name] presents for -   thinking colonoscopy because her sisters 5 years older - 6 polyps on her last colonoscopy....  Outpatient Medications Prior to Visit  Medication Sig Dispense Refill   Botulinum Toxin Type A, Cosm, 50 units SOLR Inject 4-6 Units into the muscle. 3 month  injection     Cyanocobalamin  (B-12) 1000 MCG SUBL PLACE 1 TABLET (1,000 MCG TOTAL) UNDER THE TONGUE DAILY. 100 tablet 3   DEXILANT  60 MG capsule Take 1 capsule (60 mg total) by mouth daily. 30 capsule 11   EPIPEN 2-PAK 0.3 MG/0.3ML SOAJ injection See admin instructions. Reported on 10/08/2015  1   EUCRISA 2 % OINT Apply topically. Apply 1 to 2 times a day     famotidine  (PEPCID ) 40 MG tablet Take 1 tablet (40 mg total) by mouth daily. 90 tablet 3   loratadine  (CLARITIN ) 10 MG tablet Take 1 tablet (10 mg total) by mouth daily. 100 tablet 3   mometasone  (NASONEX ) 50 MCG/ACT nasal spray USE 2 SPRAYS IN EACH NOSTRIL EVERY DAY 51 each 1   pantoprazole  (PROTONIX ) 40 MG tablet Take 1 tablet (40 mg total) by mouth daily. 90 tablet 0   timolol (TIMOPTIC) 0.25 % ophthalmic solution 2-3 DROPS ONTO AFFECTED AREA OPHTHALMIC ONCE A DAY 30 DAYS; Duration: 30     tretinoin  (RETIN-A ) 0.1 % cream Apply topically at bedtime. 20 g 3   triamcinolone  ointment (KENALOG ) 0.1 % Apply topically 2 (two) times daily. 160 g 1   UNABLE TO FIND once a week. Med Name: allergy shots     valACYclovir  (VALTREX ) 500 MG tablet TAKE 1 TABLET BY MOUTH TWICE A DAY 10 tablet 2   VITAMIN D -VITAMIN K PO Take by mouth.     Facility-Administered Medications Prior to Visit  Medication Dose Route Frequency Provider Last Rate Last Admin   denosumab  (PROLIA ) injection 60 mg  60 mg Subcutaneous Once Paticia Moster V, MD        ROS: Review of Systems   Constitutional:  Negative for activity change, appetite change, chills, fatigue and unexpected weight change.  HENT:  Negative for congestion, mouth sores and sinus pressure.   Eyes:  Negative for visual disturbance.  Respiratory:  Negative for cough and chest tightness.   Gastrointestinal:  Negative for abdominal pain and nausea.  Genitourinary:  Negative for difficulty urinating, frequency and vaginal pain.  Musculoskeletal:  Negative for back pain and gait problem.  Skin:  Negative for pallor and rash.  Neurological:  Negative for dizziness, tremors, weakness, numbness and headaches.  Psychiatric/Behavioral:  Negative for confusion, sleep disturbance and suicidal ideas.     Objective:  BP 118/70 (BP Location: Left Arm, Patient Position: Sitting, Cuff Size: Normal)   Pulse 74   Temp 98.1 F (36.7 C) (Oral)   Ht 5' 1 (1.549 m)   Wt 102 lb (46.3 kg)   SpO2 97%   BMI 19.27 kg/m   BP Readings from Last 3 Encounters:  06/15/24 118/70  05/13/24 114/68  03/10/24 126/78    Wt Readings from Last 3 Encounters:  06/15/24 102 lb (46.3 kg)  05/13/24 102 lb 6.4 oz (46.4 kg)  01/16/24 101 lb (45.8 kg)    Physical Exam Constitutional:      General: She is not in acute  distress.    Appearance: She is well-developed. She is obese.  HENT:     Head: Normocephalic.     Right Ear: External ear normal.     Left Ear: External ear normal.     Nose: Nose normal.  Eyes:     General:        Right eye: No discharge.        Left eye: No discharge.     Conjunctiva/sclera: Conjunctivae normal.     Pupils: Pupils are equal, round, and reactive to light.  Neck:     Thyroid : No thyromegaly.     Vascular: No JVD.     Trachea: No tracheal deviation.  Cardiovascular:     Rate and Rhythm: Normal rate and regular rhythm.     Heart sounds: Normal heart sounds.  Pulmonary:     Effort: No respiratory distress.     Breath sounds: No stridor. No wheezing.  Abdominal:     General: Bowel sounds  are normal. There is no distension.     Palpations: Abdomen is soft. There is no mass.     Tenderness: There is no abdominal tenderness. There is no guarding or rebound.  Musculoskeletal:        General: No tenderness.     Cervical back: Normal range of motion and neck supple. No rigidity.     Right lower leg: No edema.     Left lower leg: No edema.  Lymphadenopathy:     Cervical: No cervical adenopathy.  Skin:    Findings: No erythema or rash.  Neurological:     Cranial Nerves: No cranial nerve deficit.     Motor: No abnormal muscle tone.     Coordination: Coordination normal.     Deep Tendon Reflexes: Reflexes normal.  Psychiatric:        Behavior: Behavior normal.        Thought Content: Thought content normal.        Judgment: Judgment normal.     Lab Results  Component Value Date   WBC 3.6 (L) 04/20/2024   HGB 12.0 04/20/2024   HCT 35.8 (L) 04/20/2024   PLT 174.0 04/20/2024   GLUCOSE 82 04/20/2024   CHOL 190 04/20/2024   TRIG 78.0 04/20/2024   HDL 70.70 04/20/2024   LDLCALC 103 (H) 04/20/2024   ALT 15 04/20/2024   AST 17 04/20/2024   NA 141 04/20/2024   K 4.0 04/20/2024   CL 105 04/20/2024   CREATININE 0.79 04/20/2024   BUN 20 04/20/2024   CO2 28 04/20/2024   TSH 1.38 04/20/2024    No results found.  Assessment & Plan:   Problem List Items Addressed This Visit     B12 deficiency   Continue on vitamin B12      Other Visit Diagnoses       Family history of colonic polyps    -  Primary   Relevant Orders   Ambulatory referral to Gastroenterology         No orders of the defined types were placed in this encounter.     Follow-up: Return in about 3 months (around 09/13/2024) for a follow-up visit.  Marolyn Noel, MD "

## 2024-06-15 NOTE — Assessment & Plan Note (Signed)
Continue on vitamin B12

## 2024-06-19 ENCOUNTER — Ambulatory Visit: Admitting: Family Medicine

## 2024-06-22 ENCOUNTER — Ambulatory Visit: Payer: Medicare Other | Admitting: Obstetrics and Gynecology

## 2024-06-25 ENCOUNTER — Encounter: Payer: Self-pay | Admitting: Cardiovascular Disease

## 2024-07-01 ENCOUNTER — Encounter: Payer: Self-pay | Admitting: Internal Medicine

## 2024-07-06 ENCOUNTER — Encounter: Payer: Self-pay | Admitting: Cardiovascular Disease

## 2024-07-06 ENCOUNTER — Encounter: Payer: Self-pay | Admitting: Obstetrics and Gynecology

## 2024-07-06 ENCOUNTER — Ambulatory Visit: Admitting: Obstetrics and Gynecology

## 2024-07-06 VITALS — BP 116/68 | HR 70 | Ht 61.5 in | Wt 103.0 lb

## 2024-07-06 DIAGNOSIS — M810 Age-related osteoporosis without current pathological fracture: Secondary | ICD-10-CM | POA: Diagnosis not present

## 2024-07-06 DIAGNOSIS — Z5181 Encounter for therapeutic drug level monitoring: Secondary | ICD-10-CM | POA: Diagnosis not present

## 2024-07-06 DIAGNOSIS — N952 Postmenopausal atrophic vaginitis: Secondary | ICD-10-CM

## 2024-07-06 MED ORDER — NONFORMULARY OR COMPOUNDED ITEM: 11 refills | Status: AC

## 2024-07-06 NOTE — Progress Notes (Unsigned)
 " Cardiology Office Note:    Date:  07/07/2024   ID:  [name], DOB December 20, 1950, MRN [id number]  PCP:  [name] Karlynn GAILS, MD   [facility] Providers Cardiologist:  [name] Cash, MD     Referring MD: [name] Karlynn GAILS, MD   Chief complaint: Annual follow-up     History of Present Illness:   [name] is a 74 y.o. female with a hx of SVT, PACs, vasovagal syncope, mild aortic valve insufficiency, mild mitral regurgitation, irritable bowel syndrome and GERD who is here today for cardiac follow up.   She has had palpitations for years. Cardiac monitor April 2020 showed a short run of SVT. Exercise stress test 05/07/14 with good exercise tolerance, no ischemia. Echo 08/25/18 with LVEF=55-60%, mild AI, unchanged. 30 day event monitor March 2020 with sinus rhythm with PACs and PVCs. She wished to wear a second 30 day monitor in April 2020 and this showed an episode of SVT.  She had several episodes of SVT over the summer with an episode in June 2021 and July 2021, both lasting up to 30 minutes. She has a Radiographer, Therapeutic app for her phone and had two episodes of tachycardia in August and October 2021.  There was concern for atrial fibrillation, referred to EP.    She has been seen by Dr. Antonetta in the EP clinic in November 2022, and has had Diltiazem  and flecainide  as needed.  Most recently evaluated by Dr. Cash on 07/11/2023.  Echo demonstrating LVEF 60-65%, normal LV function, no RWMA, normal diastolic parameters, normal RV, mild AI and aortic sclerosis, no evidence of stenosis.  She presents today with her husband [name], doing well from a cardiovascular standpoint.  She denies chest pain, shortness of breath, DOE, orthopnea, PND, dizziness/lightheadedness/near syncope, dark/tarry/bloody stools, hematuria, weight changes, edema.  She will rarely have episodic tachypalpitations, occurring on average once every 4 months.  She typically will relax, try to calm  herself, perform vagal maneuvers with resolution of symptoms.  Reports having tried Rogaine in the past, which resulted in 1 tacky episode lasting around an hour, average heart rate 180 bpm.  Patient did not seek medical care, nor did she take her diltiazem , episode resolved spontaneously.  She stopped Rogaine use, had no further prolonged tachypalpitations.  Patient states she has never used her diltiazem  or flecainide  as needed, but does find comfort in having the diltiazem  at home if she needs it.  She reports she works out at least 5 days a week, and has since her 14s, with weight training and cardio, sees a magazine features editor, just ran on the treadmill at the gym a couple days ago.  Appears very physically fit.  ROS:   Please see the history of present illness.    All other systems reviewed and are negative.     Past Medical History:  Diagnosis Date   Allergic rhinitis, cause unspecified    Backache, unspecified    Benign fasciculation-cramp syndrome 12/03/2012    Worsening with  stress, fatigue .   Calculus of kidney    Conversion disorder    Cotton wool spots    Esophageal reflux    Floater, vitreous, left 05/2016   GERD (gastroesophageal reflux disease)    Hemorrhage of rectum and anus    Internal hemorrhoids without mention of complication    Irritable bowel syndrome    Microscopic hematuria    Migraine headache with aura    Mild aortic insufficiency    Mild tricuspid  regurgitation    Osteopenia    Other plastic surgery for unacceptable cosmetic appearance    Inj tx fllers/expander   Personal history of urinary calculi    Postmenopausal atrophic vaginitis    Premature atrial contractions    PVC (premature ventricular contraction)    a. Event monitor 2013.   Unspecified constipation    Vasovagal syncope     Past Surgical History:  Procedure Laterality Date   BREAST BIOPSY Right    Cosmetic Procedures w/Injection therapy     SKIN CANCER EXCISION Left    thigh    WISDOM TOOTH EXTRACTION      Current Medications: Active Medications[1]   Allergies:   Codeine, Doxycycline , Epinephrine, Latex, Neomycin, Penicillins, Promethazine , Protonix  [pantoprazole  sodium], Sulfasalazine, Sulfonamide derivatives, and Zithromax  [azithromycin ]   Social History   Socioeconomic History   Marital status: Married    Spouse name: [name]   Number of children: 0   Years of education: 14   Highest education level: Associate degree: occupational, scientist, product/process development, or vocational program  Occupational History   Occupation: OFFICE liasion    Employer: [employer]   Occupation: Real Chief Technology Officer   Occupation: RETIRED/OFFICE [employer]OHM AND [employer]AS    Employer: [employer]  Tobacco Use   Smoking status: Never    Passive exposure: Past   Smokeless tobacco: Never  Vaping Use   Vaping status: Never Used  Substance and Sexual Activity   Alcohol use: No   Drug use: No   Sexual activity: Yes    Birth control/protection: Post-menopausal    Comment: less than 5, IC after 18, no STD, no abnormal pap, no DES exposure  Other Topics Concern   Not on file  Social History Narrative   Vocational School in [place]. Married '90 - marriage is in very good shape '12, No children.  Regular Exercise -  YES, body builder. [place] who are thinking of snow birding in [place]. Offerred medical services for them if needed (Nov '11)   Patient is married [name]) and lives at home with her husband.   Patient is working pensions consultant work.   Patient has a high school education.   Patient is right-handed.   Patient does not drink any caffeine.      Lives with husband-2025      Social Drivers of Health   Tobacco Use: Low Risk (07/07/2024)   Patient History    Smoking Tobacco Use: Never    Smokeless Tobacco Use: Never    Passive Exposure: Past  Financial Resource Strain: Low Risk (05/09/2024)   Overall Financial Resource Strain (CARDIA)    Difficulty  of Paying Living Expenses: Not hard at all  Food Insecurity: No Food Insecurity (05/09/2024)   Epic    Worried About Programme Researcher, Broadcasting/film/video in the Last Year: Never true    Ran Out of Food in the Last Year: Never true  Transportation Needs: No Transportation Needs (05/09/2024)   Epic    Lack of Transportation (Medical): No    Lack of Transportation (Non-Medical): No  Physical Activity: Sufficiently Active (05/09/2024)   Exercise Vital Sign    Days of Exercise per Week: 6 days    Minutes of Exercise per Session: 60 min  Stress: No Stress Concern Present (05/09/2024)   [employer] of Occupational Health - Occupational Stress Questionnaire    Feeling of Stress: Not at all  Social Connections: Socially Integrated (05/09/2024)   Social Connection and Isolation Panel  Frequency of Communication with Friends and Family: More than three times a week    Frequency of Social Gatherings with Friends and Family: Once a week    Attends Religious Services: More than 4 times per year    Active Member of Clubs or Organizations: Yes    Attends Banker Meetings: More than 4 times per year    Marital Status: Married  Depression (PHQ2-9): Low Risk (09/02/2023)   Depression (PHQ2-9)    PHQ-2 Score: 0  Alcohol Screen: Low Risk (01/10/2022)   Alcohol Screen    Last Alcohol Screening Score (AUDIT): 0  Housing: Low Risk (05/09/2024)   Epic    Unable to Pay for Housing in the Last Year: No    Number of Times Moved in the Last Year: 0    Homeless in the Last Year: No  Utilities: Not At Risk (07/10/2023)   AHC Utilities    Threatened with loss of utilities: No  Health Literacy: Adequate Health Literacy (07/10/2023)   B1300 Health Literacy    Frequency of need for help with medical instructions: Never     Family History: The patient's family history includes COPD in her father; Headache in her sister; Hyperlipidemia in her father, mother, and sister; Hypertension in her father, mother, and  sister. There is no history of Diabetes, Colon cancer, Breast cancer, Coronary artery disease, Esophageal cancer, Stomach cancer, Pancreatic cancer, or Liver disease.  EKGs/Labs/Other Studies Reviewed:    The following studies were reviewed today:  EKG Interpretation Date/Time:  Tuesday July 07 2024 08:49:29 EST Ventricular Rate:  73 PR Interval:  168 QRS Duration:  76 QT Interval:  378 QTC Calculation: 416 R Axis:   81  Text Interpretation: Normal sinus rhythm Normal ECG When compared with ECG of 11-Jul-2023 10:18, No significant change was found Confirmed by Willye Javier [phone]) on 07/07/2024 9:19:35 AM    Recent Labs: 04/20/2024: ALT 15; BUN 20; Creatinine, Ser 0.79; Hemoglobin 12.0; Platelets 174.0; Potassium 4.0; Sodium 141; TSH 1.38  Recent Lipid Panel    Component Value Date/Time   CHOL 190 04/20/2024 0848   TRIG 78.0 04/20/2024 0848   TRIG 66 06/25/2006 0741   HDL 70.70 04/20/2024 0848   CHOLHDL 3 04/20/2024 0848   VLDL 15.6 04/20/2024 0848   LDLCALC 103 (H) 04/20/2024 0848   LDLCALC 115 (H) 01/11/2020 0800     Risk Assessment/Calculations:                Physical Exam:    VS:  BP 122/72 (BP Location: Left Arm, Patient Position: Sitting, Cuff Size: Small)   Pulse 73   Ht 5' 1 (1.549 m)   Wt 102 lb (46.3 kg)   SpO2 97%   BMI 19.27 kg/m        Wt Readings from Last 3 Encounters:  07/07/24 102 lb (46.3 kg)  07/06/24 103 lb (46.7 kg)  06/15/24 102 lb (46.3 kg)     GEN:  Well nourished, well developed in no acute distress HEENT: Normal NECK: No carotid bruits CARDIAC:  S1-S2 normal, RRR, no murmurs, rubs, gallops RESPIRATORY:  Clear to auscultation without rales, wheezing or rhonchi  MUSCULOSKELETAL:  No edema; No deformity  SKIN: Warm and dry NEUROLOGIC:  Alert and oriented x 3 PSYCHIATRIC:  Normal affect       Assessment & Plan SVT (supraventricular tachycardia) 30-day cardiac event monitor April 2020: Single episode of SVT lasting 1  minute. Has not used diltiazem  or flecainide  as previously prescribed.  Symptoms generally controlled with deep breathing, calming exercises, vagal maneuvers. Tachypalpitations lasting on average 3-5 minutes, occurring around 3 times per year. Her diltiazem  prescription at home has expired, she is requesting a new one. Will refill this for her today Continue diltiazem  30 mg every 6 hours as needed for fast heart rate ED precautions discussed for prolonged episodes Mild aortic insufficiency Echo July 11, 2023: LVEF 60-65%, normal LV function, no RWMA, normal diastolic parameters, normal RV, mild AI, sclerosis present, no evidence of stenosis. Asymptomatic today. Will continue to monitor in the future with follow-up echoes in the next 1-2 years, or sooner if new symptoms develop. Bilateral carotid artery stenosis 01/24/2018: Carotid Doppler study demonstrating bilateral carotid minimal thickening/plaque Asymptomatic today Preoperative cardiovascular examination Patient has upcoming colonoscopy, reports she has completed a couple in the past without issue. According to the Revised Cardiac Risk Index (RCRI), her Perioperative Risk of Major Cardiac Event is (%): 0.4  Her Functional Capacity in METs is: 6.05 according to the Duke Activity Status Index (DASI). Therefore, based on ACC/AHA guidelines, patient would be at acceptable risk for the planned procedure without further cardiovascular testing.   Disposition: Follow-up in 1 year or sooner if needed.  Proceed to the ED with any new or worsening symptoms.            Medication Adjustments/Labs and Tests Ordered: Current medicines are reviewed at length with the patient today.  Concerns regarding medicines are outlined above.  Orders Placed This Encounter  Procedures   EKG 12-Lead   Meds ordered this encounter  Medications   diltiazem  (CARDIZEM ) 30 MG tablet    Sig: Take 1 tablet (30 mg total) by mouth every 6 (six) hours as needed.  For elevated heart rates    Dispense:  30 tablet    Refill:  1    Supervising Provider:   PATWARDHAN, MANISH J [[id number]]    Patient Instructions  Medication Instructions:  Your physician recommends that you continue on your current medications as directed. Please refer to the Current Medication list given to you today.  *If you need a refill on your cardiac medications before your next appointment, please call your pharmacy*  Lab Work: NONE ordered at this time of appointment   Testing/Procedures: NONE ordered at this time of appointment    Follow-Up: At [facility], you and your health needs are our priority.  As part of our continuing mission to provide you with exceptional heart care, our providers are all part of one team.  This team includes your primary Cardiologist (physician) and Advanced Practice Providers or APPs (Physician Assistants and Nurse Practitioners) who all work together to provide you with the care you need, when you need it.  Your next appointment:   1 year(s)  Provider:   Lonni Cash, MD    We recommend signing up for the patient portal called MyChart.  Sign up information is provided on this After Visit Summary.  MyChart is used to connect with patients for Virtual Visits (Telemedicine).  Patients are able to view lab/test results, encounter notes, upcoming appointments, etc.  Non-urgent messages can be sent to your provider as well.   To learn more about what you can do with MyChart, go to [email or web address].   Other Instructions             Signed, Miriam FORBES Shams, NP  07/07/2024 9:26 AM    [facility]     [1]  Current Meds  Medication Sig  Botulinum Toxin Type A, Cosm, 50 units SOLR Inject 4-6 Units into the muscle. 3 month  injection   Cyanocobalamin  (B-12) 1000 MCG SUBL PLACE 1 TABLET (1,000 MCG TOTAL) UNDER THE TONGUE DAILY.   DEXILANT  60 MG capsule Take 1 capsule (60 mg total) by mouth daily.    EPIPEN 2-PAK 0.3 MG/0.3ML SOAJ injection See admin instructions. Reported on 10/08/2015   estradiol  (ESTRACE ) 0.01 % CREA vaginal cream Place 1 Applicatorful vaginally 3 (three) times a week.   EUCRISA 2 % OINT Apply topically. Apply 1 to 2 times a day   famotidine  (PEPCID ) 40 MG tablet Take 1 tablet (40 mg total) by mouth daily.   loratadine  (CLARITIN ) 10 MG tablet Take 1 tablet (10 mg total) by mouth daily.   mometasone  (NASONEX ) 50 MCG/ACT nasal spray USE 2 SPRAYS IN EACH NOSTRIL EVERY DAY   NONFORMULARY OR COMPOUNDED ITEM Estradiol  vaginal cream 0.02%, 1 ml syringe, place 1 ml per vagina at hs three times per week. Disp:  12, RF:  11.  [facility].   timolol (TIMOPTIC) 0.25 % ophthalmic solution 2-3 DROPS ONTO AFFECTED AREA OPHTHALMIC ONCE A DAY 30 DAYS; Duration: 30   tretinoin  (RETIN-A ) 0.1 % cream Apply topically at bedtime.   triamcinolone  ointment (KENALOG ) 0.1 % Apply topically 2 (two) times daily.   UNABLE TO FIND once a week. Med Name: allergy shots   valACYclovir  (VALTREX ) 500 MG tablet TAKE 1 TABLET BY MOUTH TWICE A DAY   VITAMIN D -VITAMIN K PO Take by mouth.   Current Facility-Administered Medications for the 07/07/24 encounter (Office Visit) with Cortez Flippen E, NP  Medication   denosumab  (PROLIA ) injection 60 mg   "

## 2024-07-06 NOTE — Patient Instructions (Addendum)
 Denosumab  Injection (Osteoporosis) What is this medication? DENOSUMAB  (den oh SUE mab) prevents and treats osteoporosis. It works by interior and spatial designer stronger and less likely to break (fracture). It is a monoclonal antibody. This medicine may be used for other purposes; ask your health care provider or pharmacist if you have questions. COMMON BRAND NAME(S): Prolia  What should I tell my care team before I take this medication? They need to know if you have any of these conditions: Dental or gum disease Had thyroid  or parathyroid (glands located in neck) surgery Having dental surgery or a tooth pulled Kidney disease Low levels of calcium in the blood On dialysis Poor nutrition Thyroid  disease Trouble absorbing nutrients from your food An unusual or allergic reaction to denosumab , other medications, foods, dyes, or preservatives Pregnant or trying to get pregnant Breastfeeding How should I use this medication? This medication is injected under the skin. It is given by your care team in a hospital or clinic setting. A special MedGuide will be given to you before each treatment. Be sure to read this information carefully each time. Talk to your care team about the use of this medication in children. Special care may be needed. Overdosage: If you think you have taken too much of this medicine contact a poison control center or emergency room at once. NOTE: This medicine is only for you. Do not share this medicine with others. What if I miss a dose? Keep appointments for follow-up doses. It is important not to miss your dose. Call your care team if you are unable to keep an appointment. What may interact with this medication? Do not take this medication with any of the following: Other medications that contain denosumab  This medication may also interact with the following: Medications that lower your chance of fighting infection Steroid medications, such as prednisone  or cortisone This  list may not describe all possible interactions. Give your health care provider a list of all the medicines, herbs, non-prescription drugs, or dietary supplements you use. Also tell them if you smoke, drink alcohol, or use illegal drugs. Some items may interact with your medicine. What should I watch for while using this medication? Your condition will be monitored carefully while you are receiving this medication. You may need blood work done while taking this medication. This medication may increase your risk of getting an infection. Call your care team for advice if you get a fever, chills, sore throat, or other symptoms of a cold or flu. Do not treat yourself. Try to avoid being around people who are sick. Tell your dentist and dental surgeon that you are taking this medication. You should not have major dental surgery while on this medication. See your dentist to have a dental exam and fix any dental problems before starting this medication. Take good care of your teeth while on this medication. Make sure you see your dentist for regular follow-up appointments. This medication may cause low levels of calcium in your body. The risk of severe side effects is increased in people with kidney disease. Your care team may prescribe calcium and vitamin D  to help prevent low calcium levels while you take this medication. It is important to take calcium and vitamin D  as directed by your care team. Talk to your care team if you may be pregnant. Serious birth defects may occur if you take this medication during pregnancy and for 5 months after the last dose. You will need a negative pregnancy test before starting this medication. Contraception  is recommended while taking this medication and for 5 months after the last dose. Your care team can help you find the option that works for you. Talk to your care team before breastfeeding. Changes to your treatment plan may be needed. What side effects may I notice from  receiving this medication? Side effects that you should report to your care team as soon as possible: Allergic reactions--skin rash, itching, hives, swelling of the face, lips, tongue, or throat Infection--fever, chills, cough, sore throat, wounds that don't heal, pain or trouble when passing urine, general feeling of discomfort or being unwell Low calcium level--muscle pain or cramps, confusion, tingling, or numbness in the hands or feet Osteonecrosis of the jaw--pain, swelling, or redness in the mouth, numbness of the jaw, poor healing after dental work, unusual discharge from the mouth, visible bones in the mouth Severe bone, joint, or muscle pain Skin infection--skin redness, swelling, warmth, or pain Side effects that usually do not require medical attention (report these to your care team if they continue or are bothersome): Back pain Headache Joint pain Muscle pain Pain in the hands, arms, legs, or feet Runny or stuffy nose Sore throat This list may not describe all possible side effects. Call your doctor for medical advice about side effects. You may report side effects to FDA at 1-800-FDA-1088. Where should I keep my medication? This medication is given in a hospital or clinic. It will not be stored at home. NOTE: This sheet is a summary. It may not cover all possible information. If you have questions about this medicine, talk to your doctor, pharmacist, or health care provider.  2024 Elsevier/Gold Standard (2022-07-10 00:00:00)  Calcium in Foods Calcium is a mineral in the body. Of all the minerals in your body, you have the most calcium. Most of the body's calcium supply is stored in bones and teeth. Calcium helps many parts of the body work, including: Blood and blood vessels. Nerves. Hormones. Muscles. Bones and teeth. When your calcium stores are low, you may be at risk for low bone mass, bone loss, and broken bones. When you get enough calcium, it helps to support strong  bones and teeth throughout your life. Calcium is especially important for: Children during growth spurts. Females during adolescence. Females who are pregnant or breastfeeding. Females after their menstrual cycle stops (postmenopausal). Females whose menstrual cycle has stopped because of an eating disorder or regular intense exercise. People who can't eat or digest dairy products. People who eat a vegan diet. Recommended daily amounts of calcium: Females (ages 60 to 67): 1,000 mg per day. Females (ages 6 and older): 1,200 mg per day. Males (ages 56 to 79): 1,000 mg per day. Males (ages 35 and older): 1,200 mg per day. Females (ages 87 to 87): 1,300 mg per day. Males (ages 41 to 72): 1,300 mg per day. General information Eat foods that are high in calcium. Try to get most of your calcium from food. Some people may benefit from taking calcium supplements. Check with your health care provider or an expert in healthy eating called a dietitian before starting any calcium supplements. Calcium supplements may interact with certain medicines. Too much calcium may cause other health problems, such as trouble pooping and kidney stones. For the body to absorb calcium, it needs vitamin D . Sources of vitamin D  include: Skin exposure to direct sunlight. Foods, such as egg yolks, liver, mushrooms, saltwater fish, and fortified milk. Vitamin D  supplements. Check with your provider or dietitian before starting  any vitamin D  supplements. The amount of calcium that is absorbed in the body varies with type of food. Talk to a dietitian about what foods are best for you, especially if you are eat a vegan diet or don't eat dairy. What foods are high in calcium?  Foods that are high in calcium contain more than 100 milligrams per serving. Fruits Fortified orange juice or other fruit juice, 300 mg per 8 oz (237 mL) serving. Vegetables Collard greens, 260 mg per 1 cup (130 g) serving, cooked. Kale, 180 mg  per 1 cup (118 g) serving, cooked. Bok choy, 180 mg per 1 cup (170 g) serving, cooked Grains Fortified frozen waffles, 200 mg in 2 waffles. Oatmeal, 180 mg in 1 cup (234 g) serving, cooked. Fortified white bread, 175 mg per slice. Meats and other proteins Sardines, canned with bones, 350 mg per 3.75 oz (92 g) serving. Salmon, canned with bones, 168 mg per 3 oz (85 g) serving. Canned shrimp, 125 mg per 3 oz (85 g) serving. Baked beans, 120 mg per 1 cup (266 g) serving. Tofu, firm, made with calcium sulfate, 861 mg per  cup (126 g) serving. Dairy Yogurt, plain, low-fat, 448 mg per 1 cup (245 g) serving Nonfat milk, 300 mg per 1 cup (245 g) serving. American cheese, 145 mg per 1 oz (21 g) serving or 1 slice. Cheddar cheese, 200 mg per 1 oz (28 g) serving or 1 slice. Cottage cheese 2%, 125 mg per  cup (113 g) serving. Fortified soy, rice, or almond milk, 300 mg per 1 cup (237 mL) serving. Mozzarella, part skim, 210 mg per 1 oz (21 g) serving. The items listed above may not be a complete list of foods high in calcium. Actual amounts of calcium may be different depending on processing. Contact a dietitian for more information. What foods are lower in calcium? Foods that are lower in calcium contain 50 mg or less per serving. Fruits Apple, 1 medium, about 6 mg. Banana, 1 medium, about 12 mg. Vegetables Lettuce, 19 mg per 1 cup (35 g) serving. Tomato, 1 small, about 11 mg. Grains Rice, white, 8 mg per  cup (79 g) serving. Boiled potatoes, 14 mg per 1 cup (160 g) serving. White bread, 6 mg per slice. Meats and other proteins Egg, 24 mg per 1 egg (50 g). Red meat, 7 mg per 4 oz (80 g) serving. Chicken, 17 mg per 4 oz (113 g) serving. Fish, cod, or trout, 20 mg per 4 oz (140 g) serving. Dairy Cream cheese, regular, 14 mg per 1 Tbsp (15 g) serving. Brie cheese, 50 mg per 1 oz (32 g) serving. The items listed above may not be a complete list of foods lower in calcium. Actual amounts of  calcium may be different depending on processing. Contact a dietitian for more information. This information is not intended to replace advice given to you by your health care provider. Make sure you discuss any questions you have with your health care provider. Document Revised: 03/02/2023 Document Reviewed: 03/02/2023 Elsevier Patient Education  2024 [employer]  EXERCISE AND DIET:  We recommended that you start or continue a regular exercise program for good health. Regular exercise means any activity that makes your heart beat faster and makes you sweat.  We recommend exercising at least 30 minutes per day at least 3 days a week, preferably 4 or 5.  We also recommend a diet low in fat and sugar.  Inactivity, poor dietary choices and  obesity can cause diabetes, heart attack, stroke, and kidney damage, among others.    ALCOHOL AND SMOKING:  Women should limit their alcohol intake to no more than 7 drinks/beers/glasses of wine (combined, not each!) per week. Moderation of alcohol intake to this level decreases your risk of breast cancer and liver damage. And of course, no recreational drugs are part of a healthy lifestyle.  And absolutely no smoking or even second hand smoke. Most people know smoking can cause heart and lung diseases, but did you know it also contributes to weakening of your bones? Aging of your skin?  Yellowing of your teeth and nails?  CALCIUM AND VITAMIN D :  Adequate intake of calcium and Vitamin D  are recommended.  The recommendations for exact amounts of these supplements seem to change often, but generally speaking 600 mg of calcium (either carbonate or citrate) and 800 units of Vitamin D  per day seems prudent. Certain women may benefit from higher intake of Vitamin D .  If you are among these women, your doctor will have told you during your visit.    PAP SMEARS:  Pap smears, to check for cervical cancer or precancers,  have traditionally been done yearly, although recent  scientific advances have shown that most women can have pap smears less often.  However, every woman still should have a physical exam from her gynecologist every year. It will include a breast check, inspection of the vulva and vagina to check for abnormal growths or skin changes, a visual exam of the cervix, and then an exam to evaluate the size and shape of the uterus and ovaries.  And after 72 years of age, a rectal exam is indicated to check for rectal cancers. We will also provide age appropriate advice regarding health maintenance, like when you should have certain vaccines, screening for sexually transmitted diseases, bone density testing, colonoscopy, mammograms, etc.   MAMMOGRAMS:  All women over 66 years old should have a yearly mammogram. Many facilities now offer a 3D mammogram, which may cost around $50 extra out of pocket. If possible,  we recommend you accept the option to have the 3D mammogram performed.  It both reduces the number of women who will be called back for extra views which then turn out to be normal, and it is better than the routine mammogram at detecting truly abnormal areas.    COLONOSCOPY:  Colonoscopy to screen for colon cancer is recommended for all women at age 15.  We know, you hate the idea of the prep.  We agree, BUT, having colon cancer and not knowing it is worse!!  Colon cancer so often starts as a polyp that can be seen and removed at colonscopy, which can quite literally save your life!  And if your first colonoscopy is normal and you have no family history of colon cancer, most women don't have to have it again for 10 years.  Once every ten years, you can do something that may end up saving your life, right?  We will be happy to help you get it scheduled when you are ready.  Be sure to check your insurance coverage so you understand how much it will cost.  It may be covered as a preventative service at no cost, but you should check your particular policy.

## 2024-07-06 NOTE — Progress Notes (Unsigned)
 "  GYNECOLOGY  VISIT   HPI: 74 y.o.   Married  Caucasian female   No obstetric history on file. with No LMP recorded. Patient is postmenopausal.   here for: 1 year med check- Compounded vaginal estradiol . Needs a new rx       Estradiol  vaginal cream 0.02%, 1 ml syringe, place 1 ml per vagina at hs three times per week. Disp:  12, RF:  11.  [facility].   Will have colonoscopy in February.    BMD osteoporosis.   She is interested in [place].   GYNECOLOGIC HISTORY: No LMP recorded. Patient is postmenopausal. Contraception:  PMP Menopausal hormone therapy:  Compounded estradiol  vaginal cream.  Last 2 paps:  07/01/23 neg, HR HPV neg, 01/13/19 neg History of abnormal Pap or positive HPV:  no Mammogram:  04/13/24 Breast Density Cat C, BIRADS Cat 1 neg         OB History   No obstetric history on file.        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   Family history of skin cancer 01/16/2024   History of malignant neoplasm of skin 01/16/2024   History of neoplasm 01/16/2024   Impetigo 01/16/2024   Melanocytic nevus of trunk 01/16/2024   Rosacea 01/16/2024   Seborrheic keratosis 01/16/2024   COVID-19 01/16/2024   Vitamin D  deficiency 01/16/2024   Shoulder blade pain 12/29/2023   Adverse reaction to food 12/23/2023   Upper respiratory infection 09/02/2023   Fibrocystic breast changes 04/18/2022   Osteoporosis 04/16/2022   Dizziness 02/22/2022   Toothache 12/20/2021   Gluteal tendinitis of right buttock 03/16/2021   Anxiety 11/03/2020   Allergic rhinitis due to animal hair and dander 06/29/2020   Allergic rhinitis due to pollen 06/29/2020   Stress 05/30/2020   Thoracic spine pain 02/17/2020   Herpes zoster 09/17/2019   Tachycardia 08/10/2019   Vaccination complication 08/10/2019   Adverse effect of COVID-19 vaccine  07/20/2019   Myoneural disorder, unspecified (HCC) 04/09/2019   Sore throat 06/13/2018   Friction injury to skin 04/14/2018   Occipital neuralgia 03/03/2018   B12 deficiency 03/03/2018   Bilateral impacted cerumen 10/21/2017   Excessive cerumen in both ear canals 10/21/2017   LLQ abdominal pain 09/23/2017   Elevated antinuclear antibody (ANA) level 07/26/2017   Arthralgia 07/26/2017   Degenerative disc disease, lumbar 07/04/2017   Cough 06/06/2017   Chest discomfort 03/21/2017   Neck mass 01/17/2017   Snoring 12/18/2016   Medial tibial stress syndrome, left, initial encounter 08/14/2016   Patellofemoral syndrome of both knees 08/09/2016   Drug allergy, antibiotic 05/31/2016   Hyperkalemia 02/17/2016   Vertical diplopia 02/15/2016   Tension headache 12/28/2015   BPPV (benign paroxysmal positional vertigo) 10/08/2015   TMJ click 09/13/2015   Cold sore 07/18/2015   Earache on left 07/18/2015   Cervical disc disorder with radiculopathy of cervical region 06/17/2015   Cotton wool spots 03/15/2015   Low back pain 03/08/2015   Food poisoning 03/08/2015   Generalized anxiety disorder 03/08/2015   Dysuria 02/23/2015   Well adult exam 09/09/2014   Internal hemorrhoids 09/07/2014   Allergic urticaria 08/20/2014   Acute sinusitis 08/18/2014   Neck muscle spasm 07/30/2014   Grief 06/17/2014   Mild aortic insufficiency    Mild tricuspid regurgitation    Vasovagal syncope    Premature atrial contractions  GERD (gastroesophageal reflux disease)    PVC (premature ventricular contraction)    Dense breast tissue on mammogram 03/23/2014   Microhematuria 03/22/2014   Changing skin lesion 02/05/2014   Rash and nonspecific skin eruption 12/22/2013   Chronic meniscal tear of knee 12/21/2013   Benign fasciculations 12/11/2013   Strain of adductor magnus muscle of left lower extremity 05/21/2013   Benign fasciculation-cramp syndrome 12/03/2012   Vasovagal near syncope 11/02/2012    Migraine with aura 11/02/2012   Upper airway cough syndrome 08/16/2012   Bunion of great toe 09/11/2011   Routine health maintenance 09/11/2011   Mild mitral regurgitation by prior echocardiogram 05/15/2011   Chronic neck pain 03/07/2011   Skin lesion 03/07/2011   DYSPLASTIC NEVUS, FACE 02/06/2010   HAND PAIN, BILATERAL 08/25/2009   Aortic valve disorder 08/02/2009   Abnormal involuntary movement 07/28/2009   Abnormal involuntary movement 07/28/2009   LUMBAR SPRAIN AND STRAIN 05/21/2009   Palpitations 02/03/2009   Internal hemorrhoids 12/15/2008   Constipation 12/15/2008   IBS (irritable bowel syndrome) 12/15/2008   RECTAL BLEEDING 12/15/2008   RENAL CALCULUS, HX OF 11/29/2007   Herpes simplex virus (HSV) infection 11/03/2007   Vaginal atrophy 03/25/2007   GLOBUS HYSTERICUS 01/01/2007   Allergic rhinitis 01/01/2007    Past Medical History:  Diagnosis Date   Allergic rhinitis, cause unspecified    Backache, unspecified    Benign fasciculation-cramp syndrome 12/03/2012    Worsening with  stress, fatigue .   Calculus of kidney    Conversion disorder    Cotton wool spots    Esophageal reflux    Floater, vitreous, left 05/2016   GERD (gastroesophageal reflux disease)    Hemorrhage of rectum and anus    Internal hemorrhoids without mention of complication    Irritable bowel syndrome    Microscopic hematuria    Migraine headache with aura    Mild aortic insufficiency    Mild tricuspid regurgitation    Osteopenia    Other plastic surgery for unacceptable cosmetic appearance    Inj tx fllers/expander   Personal history of urinary calculi    Postmenopausal atrophic vaginitis    Premature atrial contractions    PVC (premature ventricular contraction)    a. Event monitor 2013.   Unspecified constipation    Vasovagal syncope     Past Surgical History:  Procedure Laterality Date   BREAST BIOPSY Right    Cosmetic Procedures w/Injection therapy     SKIN CANCER EXCISION Left     thigh   WISDOM TOOTH EXTRACTION      Current Outpatient Medications  Medication Sig Dispense Refill   Botulinum Toxin Type A, Cosm, 50 units SOLR Inject 4-6 Units into the muscle. 3 month  injection     Cyanocobalamin  (B-12) 1000 MCG SUBL PLACE 1 TABLET (1,000 MCG TOTAL) UNDER THE TONGUE DAILY. 100 tablet 3   DEXILANT  60 MG capsule Take 1 capsule (60 mg total) by mouth daily. 30 capsule 11   EPIPEN 2-PAK 0.3 MG/0.3ML SOAJ injection See admin instructions. Reported on 10/08/2015  1   EUCRISA 2 % OINT Apply topically. Apply 1 to 2 times a day     famotidine  (PEPCID ) 40 MG tablet Take 1 tablet (40 mg total) by mouth daily. 90 tablet 3   loratadine  (CLARITIN ) 10 MG tablet Take 1 tablet (10 mg total) by mouth daily. 100 tablet 3   mometasone  (NASONEX ) 50 MCG/ACT nasal spray USE 2 SPRAYS IN EACH NOSTRIL EVERY DAY 51 each 1   pantoprazole  (  PROTONIX ) 40 MG tablet Take 1 tablet (40 mg total) by mouth daily. 90 tablet 0   timolol (TIMOPTIC) 0.25 % ophthalmic solution 2-3 DROPS ONTO AFFECTED AREA OPHTHALMIC ONCE A DAY 30 DAYS; Duration: 30     tretinoin  (RETIN-A ) 0.1 % cream Apply topically at bedtime. 20 g 3   triamcinolone  ointment (KENALOG ) 0.1 % Apply topically 2 (two) times daily. 160 g 1   UNABLE TO FIND once a week. Med Name: allergy shots     valACYclovir  (VALTREX ) 500 MG tablet TAKE 1 TABLET BY MOUTH TWICE A DAY 10 tablet 2   VITAMIN D -VITAMIN K PO Take by mouth.     Current Facility-Administered Medications  Medication Dose Route Frequency Provider Last Rate Last Admin   denosumab  (PROLIA ) injection 60 mg  60 mg Subcutaneous Once Plotnikov, Aleksei V, MD         ALLERGIES: Codeine, Doxycycline , Epinephrine, Latex, Neomycin, Penicillins, Promethazine , Protonix  [pantoprazole  sodium], Sulfasalazine, Sulfonamide derivatives, and Zithromax  [azithromycin ]  Family History  Problem Relation Age of Onset   Hyperlipidemia Mother    Hypertension Mother    Hyperlipidemia Father    COPD Father         Smoker, deceased Jun 18, 2014   Hypertension Father    Hypertension Sister    Hyperlipidemia Sister    Headache Sister    Diabetes Neg Hx    Colon cancer Neg Hx    Breast cancer Neg Hx    Coronary artery disease Neg Hx    Esophageal cancer Neg Hx    Stomach cancer Neg Hx    Pancreatic cancer Neg Hx    Liver disease Neg Hx     Social History   Socioeconomic History   Marital status: Married    Spouse name: Oliva   Number of children: 0   Years of education: 14   Highest education level: Associate degree: occupational, scientist, product/process development, or vocational program  Occupational History   Occupation: OFFICE liasion    Employer: [employer]   Occupation: Real Chief Technology Officer   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 Married '90 - marriage is in very good shape '12, No children.  Regular Exercise -  YES, body builder. [place] who are thinking of snow birding in [place]. Offerred medical services for them if needed (Nov '11)   Patient is married [name]) and lives at home with her husband.   Patient is working pensions consultant work.   Patient has a high school education.   Patient is right-handed.   Patient does not drink any caffeine.      Lives with husband-2025      Social Drivers of Health   Tobacco Use: Low Risk (07/06/2024)   Patient History    Smoking Tobacco Use: Never    Smokeless Tobacco Use: Never    Passive Exposure: Past  Financial Resource Strain: Low Risk  (05/09/2024)   Overall Financial Resource Strain (CARDIA)  Difficulty of Paying Living Expenses: Not hard at all  Food Insecurity: No Food Insecurity (05/09/2024)   Epic    Worried About Programme Researcher, Broadcasting/film/video in the Last Year: Never true    Ran Out of Food in the Last Year: Never true  Transportation Needs: No Transportation Needs (05/09/2024)   Epic    Lack of Transportation (Medical): No    Lack of Transportation (Non-Medical): No  Physical Activity: Sufficiently Active (05/09/2024)   Exercise Vital Sign    Days of Exercise per Week: 6 days    Minutes of Exercise per Session: 60 min  Stress: No Stress Concern Present (05/09/2024)   [employer] of Occupational Health - Occupational Stress Questionnaire    Feeling of Stress: Not at all  Social Connections: Socially Integrated (05/09/2024)   Social Connection and Isolation Panel    Frequency of Communication with Friends and Family: More than three times a week    Frequency of Social Gatherings with Friends and Family: Once a week    Attends Religious Services: More than 4 times per year    Active Member of [employer] or Organizations: Yes    Attends Banker Meetings: More than 4 times per year    Marital Status: Married  Catering Manager Violence: Not At Risk (07/10/2023)   Humiliation, Afraid, Rape, and Kick questionnaire    Fear of Current or Ex-Partner: No    Emotionally Abused: No    Physically Abused: No    Sexually Abused: No  Depression (PHQ2-9): Low Risk (09/02/2023)   Depression (PHQ2-9)    PHQ-2 Score: 0  Alcohol Screen: Low Risk (01/10/2022)   Alcohol Screen    Last Alcohol Screening Score (AUDIT): 0  Housing: Low Risk (05/09/2024)   Epic    Unable to Pay for Housing in the Last Year: No    Number of Times Moved in the Last Year: 0    Homeless in the Last Year: No  Utilities: Not At Risk (07/10/2023)   AHC Utilities    Threatened with loss of utilities: No  Health Literacy: Adequate Health Literacy  (07/10/2023)   B1300 Health Literacy    Frequency of need for help with medical instructions: Never    Review of Systems  All other systems reviewed and are negative.   PHYSICAL EXAMINATION:   BP 116/68 (BP Location: Left Arm, Patient Position: Sitting)   Pulse 70   Ht 5' 1.5 (1.562 m)   Wt 103 lb (46.7 kg)   SpO2 97%   BMI 19.15 kg/m     General appearance: alert, cooperative and appears stated age Head: Normocephalic, without obvious abnormality, atraumatic Neck: no adenopathy, supple, symmetrical, trachea midline and thyroid  normal to inspection and palpation Lungs: clear to auscultation bilaterally Breasts: normal appearance, no masses or tenderness, No nipple retraction or dimpling, No nipple discharge or bleeding, No axillary or supraclavicular adenopathy Heart: regular rate and rhythm Abdomen: soft, non-tender, no masses,  no organomegaly Extremities: extremities normal, atraumatic, no cyanosis or edema Skin: Skin color, texture, turgor normal. No rashes or lesions Lymph nodes: Cervical, supraclavicular, and axillary nodes normal. No abnormal inguinal nodes palpated Neurologic: Grossly normal  Pelvic: External genitalia:  no lesions              Urethra:  normal appearing urethra with no masses, tenderness or lesions              Bartholins and Skenes: normal  Vagina: normal appearing vagina with normal color and discharge, no lesions              Cervix: no lesions                Bimanual Exam:  Uterus:  normal size, contour, position, consistency, mobility, non-tender              Adnexa: no mass, fullness, tenderness              Rectal exam: yes.  Confirms.              Anus:  normal sphincter tone, no lesions  Chaperone was present for exam:  Kari HERO, CMA  ASSESSMENT:  Vaginal atrophy.   Encounter for medication monitoring.  Hx HSV I. Osteoporosis.   PLAN:  Refill of compounded vaginal estradiol   {LABS (Optional):23779}  ***  total  time was spent for this patient encounter, including preparation, face-to-face counseling with the patient, coordination of care, and documentation of the encounter.    "

## 2024-07-07 ENCOUNTER — Ambulatory Visit: Attending: Emergency Medicine | Admitting: Emergency Medicine

## 2024-07-07 ENCOUNTER — Encounter: Payer: Self-pay | Admitting: Emergency Medicine

## 2024-07-07 VITALS — BP 122/72 | HR 73 | Ht 61.0 in | Wt 102.0 lb

## 2024-07-07 DIAGNOSIS — Z01810 Encounter for preprocedural cardiovascular examination: Secondary | ICD-10-CM | POA: Insufficient documentation

## 2024-07-07 DIAGNOSIS — I6523 Occlusion and stenosis of bilateral carotid arteries: Secondary | ICD-10-CM | POA: Diagnosis not present

## 2024-07-07 DIAGNOSIS — I351 Nonrheumatic aortic (valve) insufficiency: Secondary | ICD-10-CM | POA: Diagnosis not present

## 2024-07-07 DIAGNOSIS — I471 Supraventricular tachycardia, unspecified: Secondary | ICD-10-CM | POA: Diagnosis not present

## 2024-07-07 MED ORDER — DILTIAZEM HCL 30 MG PO TABS: 30.0000 mg | ORAL_TABLET | Freq: Four times a day (QID) | ORAL | 1 refills | Status: AC | PRN

## 2024-07-07 NOTE — Assessment & Plan Note (Signed)
 Echo July 11, 2023: LVEF 60-65%, normal LV function, no RWMA, normal diastolic parameters, normal RV, mild AI, sclerosis present, no evidence of stenosis. Asymptomatic today. Will continue to monitor in the future with follow-up echoes in the next 1-2 years, or sooner if new symptoms develop.

## 2024-07-07 NOTE — Patient Instructions (Signed)
 Medication Instructions:  Your physician recommends that you continue on your current medications as directed. Please refer to the Current Medication list given to you today.  *If you need a refill on your cardiac medications before your next appointment, please call your pharmacy*  Lab Work: NONE ordered at this time of appointment   Testing/Procedures: NONE ordered at this time of appointment    Follow-Up: At [facility], you and your health needs are our priority.  As part of our continuing mission to provide you with exceptional heart care, our providers are all part of one team.  This team includes your primary Cardiologist (physician) and Advanced Practice Providers or APPs (Physician Assistants and Nurse Practitioners) who all work together to provide you with the care you need, when you need it.  Your next appointment:   1 year(s)  Provider:   Lonni Cash, MD    We recommend signing up for the patient portal called MyChart.  Sign up information is provided on this After Visit Summary.  MyChart is used to connect with patients for Virtual Visits (Telemedicine).  Patients are able to view lab/test results, encounter notes, upcoming appointments, etc.  Non-urgent messages can be sent to your provider as well.   To learn more about what you can do with MyChart, go to [email or web address].   Other Instructions

## 2024-07-08 ENCOUNTER — Encounter: Payer: Self-pay | Admitting: Cardiovascular Disease

## 2024-07-09 ENCOUNTER — Telehealth: Payer: Self-pay

## 2024-07-09 ENCOUNTER — Ambulatory Visit

## 2024-07-09 VITALS — Ht 61.0 in | Wt 102.6 lb

## 2024-07-09 DIAGNOSIS — Z1211 Encounter for screening for malignant neoplasm of colon: Secondary | ICD-10-CM

## 2024-07-09 MED ORDER — NA SULFATE-K SULFATE-MG SULF 17.5-3.13-1.6 GM/177ML PO SOLN: 1.0000 | Freq: Once | ORAL | 0 refills | Status: AC

## 2024-07-09 NOTE — Telephone Encounter (Signed)
 Good Afternoon Dr. Abran,  I completed a PV on your patient today, upcoming screening colonoscopy on 2/2.  She was wanting to discuss need for colonoscopy vs cologuard, as well as, having multiple questions regarding risks r/t cardiac hx, etc. I have scheduled an appt for her to see you on 1/27 and kept her colonoscopy on the schedule. Thank you

## 2024-07-09 NOTE — Progress Notes (Signed)
 No egg or soy allergy known to patient  No issues known to pt with past sedation with any surgeries or procedures Never been intubated Patient denies ever being told they had issues or difficulty with intubation  No FH of Malignant Hyperthermia Pt is not on diet pills Pt is not on  home 02  Pt is not on blood thinners  Pt denies issues with constipation  No A fib or A flutter Have any cardiac testing pending--No Pt can ambulate  Pt denies use of chewing tobacco Discussed diabetic I weight loss medication holds Discussed NSAID holds Checked BMI Pt instructed to use Singlecare.com or GoodRx for a price reduction on prep  Patient's chart reviewed by Norleen Schillings CNRA prior to previsit and patient appropriate for the [facility].  Pre visit completed and red dot placed by patient's name on their procedure day (on provider's schedule).

## 2024-07-10 ENCOUNTER — Ambulatory Visit

## 2024-07-13 ENCOUNTER — Ambulatory Visit: Admitting: Internal Medicine

## 2024-07-13 ENCOUNTER — Encounter: Payer: Self-pay | Admitting: Internal Medicine

## 2024-07-14 ENCOUNTER — Ambulatory Visit: Admitting: Internal Medicine

## 2024-07-15 ENCOUNTER — Ambulatory Visit: Admitting: Internal Medicine

## 2024-07-16 ENCOUNTER — Ambulatory Visit: Admitting: Internal Medicine

## 2024-07-16 ENCOUNTER — Encounter: Payer: Self-pay | Admitting: Internal Medicine

## 2024-07-16 VITALS — BP 118/74 | HR 73 | Temp 98.3°F | Ht 63.0 in | Wt 103.0 lb

## 2024-07-16 DIAGNOSIS — J0100 Acute maxillary sinusitis, unspecified: Secondary | ICD-10-CM

## 2024-07-16 MED ORDER — CEFDINIR 300 MG PO CAPS: 300.0000 mg | ORAL_CAPSULE | Freq: Two times a day (BID) | ORAL | 0 refills | Status: AC

## 2024-07-16 NOTE — Assessment & Plan Note (Signed)
Start Cefdinir

## 2024-07-16 NOTE — Progress Notes (Unsigned)
 "  Subjective:  Patient ID: [name], female    DOB: 11/28/1950  Age: 74 y.o. MRN: [id number]  CC: Acute Visit (Runny nose, sneezing and headache. )   HPI [place] presents for URI sx's, thick drainage, COVID (-)  Outpatient Medications Prior to Visit  Medication Sig Dispense Refill   Botulinum Toxin Type A, Cosm, 50 units SOLR Inject 4-6 Units into the muscle. 3 month  injection     Cyanocobalamin  (B-12) 1000 MCG SUBL PLACE 1 TABLET (1,000 MCG TOTAL) UNDER THE TONGUE DAILY. 100 tablet 3   DEXILANT  60 MG capsule Take 1 capsule (60 mg total) by mouth daily. 30 capsule 11   diltiazem  (CARDIZEM ) 30 MG tablet Take 1 tablet (30 mg total) by mouth every 6 (six) hours as needed. For elevated heart rates 30 tablet 1   EPIPEN 2-PAK 0.3 MG/0.3ML SOAJ injection See admin instructions. Reported on 10/08/2015  1   estradiol  (ESTRACE ) 0.01 % CREA vaginal cream Place 1 Applicatorful vaginally 3 (three) times a week.     EUCRISA 2 % OINT Apply topically. Apply 1 to 2 times a day     famotidine  (PEPCID ) 40 MG tablet Take 1 tablet (40 mg total) by mouth daily. 90 tablet 3   loratadine  (CLARITIN ) 10 MG tablet Take 1 tablet (10 mg total) by mouth daily. 100 tablet 3   mometasone  (NASONEX ) 50 MCG/ACT nasal spray USE 2 SPRAYS IN EACH NOSTRIL EVERY DAY 51 each 1   NONFORMULARY OR COMPOUNDED ITEM Estradiol  vaginal cream 0.02%, 1 ml syringe, place 1 ml per vagina at hs three times per week. Disp:  12, RF:  11.  [facility]. 12 each 11   pantoprazole  (PROTONIX ) 40 MG tablet Take 1 tablet (40 mg total) by mouth daily. 90 tablet 0   timolol (TIMOPTIC) 0.25 % ophthalmic solution 2-3 DROPS ONTO AFFECTED AREA OPHTHALMIC ONCE A DAY 30 DAYS; Duration: 30     tretinoin  (RETIN-A ) 0.1 % cream Apply topically at bedtime. 20 g 3   triamcinolone  ointment (KENALOG ) 0.1 % Apply topically 2 (two) times daily. 160 g 1   UNABLE TO FIND once a week. Med Name: allergy shots     valACYclovir  (VALTREX ) 500 MG  tablet TAKE 1 TABLET BY MOUTH TWICE A DAY 10 tablet 2   VITAMIN D -VITAMIN K PO Take by mouth.     Facility-Administered Medications Prior to Visit  Medication Dose Route Frequency Provider Last Rate Last Admin   denosumab  (PROLIA ) injection 60 mg  60 mg Subcutaneous Once Hosteen Kienast V, MD        ROS: Review of Systems  Objective:  BP 118/74   Pulse 73   Temp 98.3 F (36.8 C) (Oral)   Ht 5' 3 (1.6 m)   Wt 103 lb (46.7 kg)   SpO2 98%   BMI 18.25 kg/m   BP Readings from Last 3 Encounters:  07/16/24 118/74  07/07/24 122/72  07/06/24 116/68    Wt Readings from Last 3 Encounters:  07/16/24 103 lb (46.7 kg)  07/09/24 102 lb 9.6 oz (46.5 kg)  07/07/24 102 lb (46.3 kg)    Physical Exam  Lab Results  Component Value Date   WBC 3.6 (L) 04/20/2024   HGB 12.0 04/20/2024   HCT 35.8 (L) 04/20/2024   PLT 174.0 04/20/2024   GLUCOSE 82 04/20/2024   CHOL 190 04/20/2024   TRIG 78.0 04/20/2024   HDL 70.70 04/20/2024   LDLCALC 103 (H) 04/20/2024   ALT 15 04/20/2024  AST 17 04/20/2024   NA 141 04/20/2024   K 4.0 04/20/2024   CL 105 04/20/2024   CREATININE 0.79 04/20/2024   BUN 20 04/20/2024   CO2 28 04/20/2024   TSH 1.38 04/20/2024    No results found.  Assessment & Plan:   Problem List Items Addressed This Visit   None     No orders of the defined types were placed in this encounter.     Follow-up: No follow-ups on file.  Marolyn Noel, MD "

## 2024-07-17 ENCOUNTER — Ambulatory Visit: Admitting: Family Medicine

## 2024-07-17 ENCOUNTER — Other Ambulatory Visit: Payer: Self-pay

## 2024-07-17 ENCOUNTER — Encounter: Payer: Self-pay | Admitting: Family Medicine

## 2024-07-17 ENCOUNTER — Ambulatory Visit: Admitting: Cardiovascular Disease

## 2024-07-17 VITALS — BP 122/72 | HR 78 | Ht 63.0 in | Wt 102.0 lb

## 2024-07-17 DIAGNOSIS — M25511 Pain in right shoulder: Secondary | ICD-10-CM | POA: Diagnosis not present

## 2024-07-17 DIAGNOSIS — M19011 Primary osteoarthritis, right shoulder: Secondary | ICD-10-CM | POA: Insufficient documentation

## 2024-07-17 DIAGNOSIS — M7551 Bursitis of right shoulder: Secondary | ICD-10-CM | POA: Insufficient documentation

## 2024-07-17 NOTE — Assessment & Plan Note (Signed)
 Arthritis noted with moderate to hypoechoic changes significant for an effusion.  Discussed potential injections.  Patient declined any injection at the moment though.  Increase activity slowly.  Discussed icing regimen.  Follow-up again in 6 to 8 weeks.

## 2024-07-17 NOTE — Patient Instructions (Signed)
 Scapular Ice and Voltaren  See me in 7-8 weeks

## 2024-07-17 NOTE — Assessment & Plan Note (Signed)
 Patient does have supraspinatus tendinopathy noted with some degenerative tearing of the rotator cuff but no significant retraction noted.  On exam patient has great strength noted.  Do believe that more patient's pain is secondary to an acute bursitis and acromioclavicular arthritis.  Patient given home exercises, will do topical anti-inflammatories and icing, increase activity slowly.  Follow-up with me again after 6 weeks.  Worsening pain consider formal physical therapy or injections.

## 2024-07-17 NOTE — Progress Notes (Signed)
 " D[facility]n Claud[facility] [address] [place] [address] Phone: [phone] Subjective:   ISusannah Gully, am serving as a scribe for Dr. Arthea Claudene.  I'm seeing this patient by the request  of:  Plotnikov, Karlynn GAILS, MD  CC: Right shoulder pain  YEP:Dlagzrupcz  [name] is a 74 y.o. female coming in with complaint of right shoulder pain.  Has been a while since we have seen this individual.  From previous x-rays known arthritic changes of the cervical and lumbar spine.Patient is an extremely active individual. Pain is on and off and can have discomfort going down the arm. Has a systems analyst. Pain with reaching up. No home treatments. No clicking or popping. Sometimes radiating pain into neck.       Past Medical History:  Diagnosis Date   Allergic rhinitis, cause unspecified    Backache, unspecified    Benign fasciculation-cramp syndrome 12/03/2012    Worsening with  stress, fatigue .   Calculus of kidney    Conversion disorder    Cotton wool spots    Esophageal reflux    Floater, vitreous, left 05/2016   GERD (gastroesophageal reflux disease)    Hemorrhage of rectum and anus    Internal hemorrhoids without mention of complication    Irritable bowel syndrome    Microscopic hematuria    Migraine headache with aura    Mild aortic insufficiency    Mild tricuspid regurgitation    Osteopenia    Other plastic surgery for unacceptable cosmetic appearance    Inj tx fllers/expander   Personal history of urinary calculi    Postmenopausal atrophic vaginitis    Premature atrial contractions    PVC (premature ventricular contraction)    a. Event monitor 2013.   Unspecified constipation    Vasovagal syncope    Past Surgical History:  Procedure Laterality Date   BREAST BIOPSY Right    Cosmetic Procedures w/Injection therapy     SKIN CANCER EXCISION Left    thigh   WISDOM TOOTH EXTRACTION     Social History   Socioeconomic History    Marital status: Married    Spouse name: Oliva   Number of children: 0   Years of education: 14   Highest education level: Associate degree: occupational, scientist, product/process development, or vocational program  Occupational History   Occupation: OFFICE liasion    Employer: [employer]   Occupation: Real Chief Technology Officer   Occupation: RETIRED/OFFICE [employer] AND [employer]    Employer: [employer]  Tobacco Use   Smoking status: Never    Passive exposure: Past   Smokeless tobacco: Never  Vaping Use   Vaping status: Never Used  Substance and Sexual Activity   Alcohol use: No   Drug use: No   Sexual activity: Yes    Birth control/protection: Post-menopausal    Comment: less than 5, IC after 18, no STD, no abnormal pap, no DES exposure  Other Topics Concern   Not on file  Social History Narrative   Vocational School in [place]. Married '90 - marriage is in very good shape '12, No children.  Regular Exercise -  YES, body builder. [place] who are thinking of snow birding in [place]. Offerred medical services for them if needed (Nov '11)   Patient is married [name]) and lives at home with her husband.   Patient is working pensions consultant work.   Patient has a high school education.   Patient is right-handed.  Patient does not drink any caffeine.      Lives with husband-2025      Social Drivers of Health   Tobacco Use: Low Risk (07/16/2024)   Patient History    Smoking Tobacco Use: Never    Smokeless Tobacco Use: Never    Passive Exposure: Past  Financial Resource Strain: Low Risk (05/09/2024)   Overall Financial Resource Strain (CARDIA)    Difficulty of Paying Living Expenses: Not hard at all  Food Insecurity: No Food Insecurity (05/09/2024)   Epic    Worried About Programme Researcher, Broadcasting/film/video in the Last Year: Never true    Ran Out of Food in the Last Year: Never true  Transportation Needs: No Transportation Needs (05/09/2024)   Epic    Lack of Transportation (Medical):  No    Lack of Transportation (Non-Medical): No  Physical Activity: Sufficiently Active (05/09/2024)   Exercise Vital Sign    Days of Exercise per Week: 6 days    Minutes of Exercise per Session: 60 min  Stress: No Stress Concern Present (05/09/2024)   [employer] of Occupational Health - Occupational Stress Questionnaire    Feeling of Stress: Not at all  Social Connections: Socially Integrated (05/09/2024)   Social Connection and Isolation Panel    Frequency of Communication with Friends and Family: More than three times a week    Frequency of Social Gatherings with Friends and Family: Once a week    Attends Religious Services: More than 4 times per year    Active Member of Clubs or Organizations: Yes    Attends Banker Meetings: More than 4 times per year    Marital Status: Married  Depression (PHQ2-9): Low Risk (09/02/2023)   Depression (PHQ2-9)    PHQ-2 Score: 0  Alcohol Screen: Low Risk (01/10/2022)   Alcohol Screen    Last Alcohol Screening Score (AUDIT): 0  Housing: Low Risk (05/09/2024)   Epic    Unable to Pay for Housing in the Last Year: No    Number of Times Moved in the Last Year: 0    Homeless in the Last Year: No  Utilities: Not At Risk (07/10/2023)   AHC Utilities    Threatened with loss of utilities: No  Health Literacy: Adequate Health Literacy (07/10/2023)   B1300 Health Literacy    Frequency of need for help with medical instructions: Never   Allergies[1] Family History  Problem Relation Age of Onset   Hyperlipidemia Mother    Hypertension Mother    Hyperlipidemia Father    COPD Father        Smoker, deceased 05-27-2014   Hypertension Father    Hypertension Sister    Hyperlipidemia Sister    Headache Sister    Diabetes Neg Hx    Colon cancer Neg Hx    Breast cancer Neg Hx    Coronary artery disease Neg Hx    Esophageal cancer Neg Hx    Stomach cancer Neg Hx    Pancreatic cancer Neg Hx    Liver disease Neg Hx    Rectal cancer Neg Hx      Current Facility-Administered Medications (Endocrine & Metabolic):    denosumab  (PROLIA ) injection 60 mg  Current Outpatient Medications (Cardiovascular):    diltiazem  (CARDIZEM ) 30 MG tablet, Take 1 tablet (30 mg total) by mouth every 6 (six) hours as needed. For elevated heart rates   EPIPEN 2-PAK 0.3 MG/0.3ML SOAJ injection, See admin instructions. Reported on 10/08/2015  Current Outpatient Medications (Respiratory):  loratadine  (CLARITIN ) 10 MG tablet, Take 1 tablet (10 mg total) by mouth daily.   mometasone  (NASONEX ) 50 MCG/ACT nasal spray, USE 2 SPRAYS IN EACH NOSTRIL EVERY DAY  Current Outpatient Medications (Hematological):    Cyanocobalamin  (B-12) 1000 MCG SUBL, PLACE 1 TABLET (1,000 MCG TOTAL) UNDER THE TONGUE DAILY.  Current Outpatient Medications (Other):    Botulinum Toxin Type A, Cosm, 50 units SOLR, Inject 4-6 Units into the muscle. 3 month  injection   cefdinir  (OMNICEF ) 300 MG capsule, Take 1 capsule (300 mg total) by mouth 2 (two) times daily.   DEXILANT  60 MG capsule, Take 1 capsule (60 mg total) by mouth daily.   estradiol  (ESTRACE ) 0.01 % CREA vaginal cream, Place 1 Applicatorful vaginally 3 (three) times a week.   EUCRISA 2 % OINT, Apply topically. Apply 1 to 2 times a day   famotidine  (PEPCID ) 40 MG tablet, Take 1 tablet (40 mg total) by mouth daily.   NONFORMULARY OR COMPOUNDED ITEM, Estradiol  vaginal cream 0.02%, 1 ml syringe, place 1 ml per vagina at hs three times per week. Disp:  12, RF:  11.  [facility].   pantoprazole  (PROTONIX ) 40 MG tablet, Take 1 tablet (40 mg total) by mouth daily.   timolol (TIMOPTIC) 0.25 % ophthalmic solution, 2-3 DROPS ONTO AFFECTED AREA OPHTHALMIC ONCE A DAY 30 DAYS; Duration: 30   tretinoin  (RETIN-A ) 0.1 % cream, Apply topically at bedtime.   triamcinolone  ointment (KENALOG ) 0.1 %, Apply topically 2 (two) times daily.   UNABLE TO FIND, once a week. Med Name: allergy shots   valACYclovir  (VALTREX ) 500 MG tablet, TAKE  1 TABLET BY MOUTH TWICE A DAY   VITAMIN D -VITAMIN K PO, Take by mouth.   Reviewed prior external information including notes and imaging from  primary care provider As well as notes that were available from care everywhere and other healthcare systems.  Past medical history, social, surgical and family history all reviewed in electronic medical record.  No pertanent information unless stated regarding to the chief complaint.   Review of Systems:  No headache, visual changes, nausea, vomiting, diarrhea, constipation, dizziness, abdominal pain, skin rash, fevers, chills, night sweats, weight loss, swollen lymph nodes, body aches, joint swelling, chest pain, shortness of breath, mood changes. POSITIVE muscle aches  Objective  Blood pressure 122/72, pulse 78, height 5' 3 (1.6 m), weight 102 lb (46.3 kg), SpO2 98%.   General: No apparent distress alert and oriented x3 mood and affect normal, dressed appropriately.  HEENT: Pupils equal, extraocular movements intact  Respiratory: Patient's speak in full sentences and does not appear short of breath  Cardiovascular: No lower extremity edema, non tender, no erythema  Shoulder exam shows patient does have some hypoechoic changes noted.  Some tenderness to palpation, positive impingement noted with Neer and Hawkins.  Positive crossover noted.  Rotator cuff strength is intact.  Limited muscular skeletal ultrasound was performed and interpreted by CLAUDENE HUSSAR, M  Limited ultrasound shows some degenerative tearing.  Patient does have hypoechoic changes in the subacromial and subscapular area consistent with a bursitis.  Patient's acromioclavicular joint moderate narrowing noted with hypoechoic changes consistent with this effusion. Impression: Acute on chronic irritation of the shoulder.  Most likely bursitis and acromioclavicular arthritis    Impression and Recommendations:     The above documentation has been reviewed and is accurate and  complete Hussar CHRISTELLA Claudene, DO       [1]  Allergies Allergen Reactions   Penicillins Hives and Other (See  Comments)    Has patient had a PCN reaction causing immediate rash,    Codeine Other (See Comments)    Parethesia on head   Doxycycline  Other (See Comments)    Can't remember   Epinephrine Other (See Comments)    Heart racing    Latex Other (See Comments)    Redness and swelling    Neomycin Rash    Topical rash from cream   Promethazine  Other (See Comments)    Can't remember   Protonix  [Pantoprazole  Sodium] Other (See Comments)    ?HAs   Sulfasalazine Other (See Comments)    Doesn't remember     Doesn't remember   Zithromax  [Azithromycin ] Rash   "

## 2024-07-20 ENCOUNTER — Encounter: Admitting: Internal Medicine

## 2024-07-20 ENCOUNTER — Encounter: Payer: Self-pay | Admitting: Internal Medicine

## 2024-07-23 ENCOUNTER — Ambulatory Visit: Payer: Self-pay

## 2024-07-23 NOTE — Telephone Encounter (Signed)
" °  Communication Pink Word that prompted message to Nurse Triage: Patient was seen 07/16/24 and was told that the cefdinir  (OMNICEF ) 300 MG capsule could cause UTI. Patient believes that UTI is starting, she's having burning sensation and would like a medication sent to pharmacy Please send message to Nurse Triage by clicking Send Message and Close CRM.  "

## 2024-07-23 NOTE — Telephone Encounter (Signed)
 FYI Only or Action Required?: Action required by provider: update on patient condition. Pt requesting yeast infection medication that was offered at appt last week.   Patient was last seen in primary care on 07/16/2024 by Plotnikov, Karlynn GAILS, MD.  Called Nurse Triage reporting Vaginitis.  Symptoms began yesterday.  Interventions attempted: Prescription medications: pt is on omnicef  currently.  Symptoms are: gradually worsening.  Triage Disposition: See Physician Within 24 Hours, See PCP Within 2 Weeks  Patient/caregiver understands and will follow disposition?: No, wishes to speak with PCP  Reason for Disposition  All other vaginal symptoms  (Exceptions: Feels like prior yeast infection, minor abrasion, mild rash < 24 hour duration, mild itching, vaginal dryness during sex.)  Age > 50 years  Answer Assessment - Initial Assessment Questions 1. SYMPTOM: What's the main symptom you're concerned about? (e.g., pain, itching, dryness)     Pain with urination 2. LOCATION: Where is the  burning located? (e.g., inside/outside, left/right)     Only during urination 3. ONSET: When did the burning  start?     Last night 4. PAIN: Is there any pain? If Yes, ask: How bad is it? (Scale: 1-10; mild, moderate, severe)     discomfort 5. ITCHING: Is there any itching? If Yes, ask: How bad is it? (Scale: 1-10; mild, moderate, severe)     denies 6. CAUSE: What do you think is causing the discharge? Have you had the same problem before? What happened then?     Yeast infection from omnicef  7. OTHER SYMPTOMS: Do you have any other symptoms? (e.g., fever, itching, vaginal bleeding, pain with urination, injury to genital area, vaginal foreign body)     Denies fever, states she has pain with urination, denies discharge, states that she has an odor as well  Pt states that she would like the yeast infection medication that her PCP had offered at last visit. Pt was offered appt, pt  declines at this time.  Protocols used: Vaginal Symptoms-A-AH, Urination Pain - Female-A-AH

## 2024-07-24 ENCOUNTER — Other Ambulatory Visit: Payer: Self-pay | Admitting: Internal Medicine

## 2024-07-24 MED ORDER — FLUCONAZOLE 150 MG PO TABS: 150.0000 mg | ORAL_TABLET | Freq: Once | ORAL | 0 refills | Status: AC

## 2024-07-24 NOTE — Telephone Encounter (Signed)
 Diflucan  prescription was sent.  Thanks

## 2024-07-30 ENCOUNTER — Ambulatory Visit

## 2024-07-31 ENCOUNTER — Ambulatory Visit: Admitting: Internal Medicine

## 2024-09-04 ENCOUNTER — Ambulatory Visit: Admitting: Family Medicine

## 2024-09-10 ENCOUNTER — Ambulatory Visit: Admitting: Internal Medicine

## 2024-10-07 ENCOUNTER — Ambulatory Visit: Admitting: Neurology

## 2024-12-03 ENCOUNTER — Ambulatory Visit: Admitting: Podiatry
